# Patient Record
Sex: Male | Born: 1940 | Race: Black or African American | Hispanic: No | Marital: Married | Smoking: Former smoker
Health system: Southern US, Community
[De-identification: ages and names within clinical notes are randomized; demographics above are authoritative.]

## PROBLEM LIST (undated history)

## (undated) DIAGNOSIS — I10 Essential (primary) hypertension: Secondary | ICD-10-CM

## (undated) DIAGNOSIS — I219 Acute myocardial infarction, unspecified: Secondary | ICD-10-CM

## (undated) DIAGNOSIS — I2510 Atherosclerotic heart disease of native coronary artery without angina pectoris: Secondary | ICD-10-CM

## (undated) DIAGNOSIS — D649 Anemia, unspecified: Secondary | ICD-10-CM

## (undated) DIAGNOSIS — I350 Nonrheumatic aortic (valve) stenosis: Secondary | ICD-10-CM

## (undated) DIAGNOSIS — I6529 Occlusion and stenosis of unspecified carotid artery: Secondary | ICD-10-CM

## (undated) DIAGNOSIS — E119 Type 2 diabetes mellitus without complications: Secondary | ICD-10-CM

## (undated) DIAGNOSIS — L97529 Non-pressure chronic ulcer of other part of left foot with unspecified severity: Secondary | ICD-10-CM

## (undated) DIAGNOSIS — R011 Cardiac murmur, unspecified: Secondary | ICD-10-CM

## (undated) DIAGNOSIS — E785 Hyperlipidemia, unspecified: Secondary | ICD-10-CM

## (undated) DIAGNOSIS — M869 Osteomyelitis, unspecified: Secondary | ICD-10-CM

## (undated) DIAGNOSIS — A4101 Sepsis due to Methicillin susceptible Staphylococcus aureus: Secondary | ICD-10-CM

## (undated) DIAGNOSIS — I509 Heart failure, unspecified: Secondary | ICD-10-CM

## (undated) DIAGNOSIS — E11621 Type 2 diabetes mellitus with foot ulcer: Secondary | ICD-10-CM

## (undated) HISTORY — DX: Acute myocardial infarction, unspecified: I21.9

## (undated) HISTORY — DX: Type 2 diabetes mellitus without complications: E11.9

## (undated) HISTORY — DX: Cardiac murmur, unspecified: R01.1

## (undated) HISTORY — PX: CORONARY ANGIOPLASTY WITH STENT PLACEMENT: SHX49

## (undated) HISTORY — DX: Heart failure, unspecified: I50.9

## (undated) HISTORY — DX: Essential (primary) hypertension: I10

## (undated) HISTORY — DX: Non-pressure chronic ulcer of other part of left foot with unspecified severity: L97.529

## (undated) HISTORY — DX: Atherosclerotic heart disease of native coronary artery without angina pectoris: I25.10

## (undated) HISTORY — DX: Anemia, unspecified: D64.9

## (undated) HISTORY — DX: Hyperlipidemia, unspecified: E78.5

## (undated) HISTORY — DX: Type 2 diabetes mellitus with foot ulcer: E11.621

## (undated) HISTORY — DX: Nonrheumatic aortic (valve) stenosis: I35.0

## (undated) HISTORY — DX: Occlusion and stenosis of unspecified carotid artery: I65.29

## (undated) HISTORY — PX: BLADDER REPAIR: SHX76

## (undated) HISTORY — PX: APPENDECTOMY: SHX54

## (undated) HISTORY — DX: Osteomyelitis, unspecified: M86.9

## (undated) HISTORY — DX: Sepsis due to methicillin susceptible Staphylococcus aureus: A41.01

## (undated) HISTORY — PX: CARDIAC CATHETERIZATION: SHX172

---

## 2003-05-10 ENCOUNTER — Observation Stay (HOSPITAL_COMMUNITY): Admission: RE | Admit: 2003-05-10 | Discharge: 2003-05-11 | Payer: Self-pay | Admitting: Cardiology

## 2005-03-13 ENCOUNTER — Ambulatory Visit: Payer: Self-pay

## 2005-08-19 ENCOUNTER — Ambulatory Visit: Payer: Self-pay | Admitting: Internal Medicine

## 2005-09-18 ENCOUNTER — Ambulatory Visit: Payer: Self-pay

## 2005-09-18 ENCOUNTER — Encounter: Payer: Self-pay | Admitting: Cardiology

## 2006-02-27 ENCOUNTER — Ambulatory Visit: Payer: Self-pay | Admitting: Cardiology

## 2006-04-04 ENCOUNTER — Ambulatory Visit: Payer: Self-pay

## 2007-07-22 ENCOUNTER — Ambulatory Visit: Payer: Self-pay

## 2007-07-22 ENCOUNTER — Ambulatory Visit: Payer: Self-pay | Admitting: Cardiology

## 2007-07-28 ENCOUNTER — Ambulatory Visit: Payer: Self-pay

## 2008-04-16 ENCOUNTER — Inpatient Hospital Stay (HOSPITAL_COMMUNITY): Admission: EM | Admit: 2008-04-16 | Discharge: 2008-04-18 | Payer: Self-pay | Admitting: Emergency Medicine

## 2008-04-16 ENCOUNTER — Ambulatory Visit: Payer: Self-pay | Admitting: Cardiology

## 2008-05-11 ENCOUNTER — Ambulatory Visit: Payer: Self-pay | Admitting: Cardiology

## 2008-07-22 ENCOUNTER — Ambulatory Visit: Payer: Self-pay

## 2008-08-02 ENCOUNTER — Ambulatory Visit: Payer: Medicare PPO | Admitting: Ophthalmology

## 2008-08-15 ENCOUNTER — Ambulatory Visit: Payer: Medicare PPO | Admitting: Ophthalmology

## 2008-08-19 ENCOUNTER — Ambulatory Visit: Payer: Self-pay | Admitting: Cardiology

## 2008-09-09 ENCOUNTER — Encounter (INDEPENDENT_AMBULATORY_CARE_PROVIDER_SITE_OTHER): Payer: Self-pay | Admitting: *Deleted

## 2008-12-14 ENCOUNTER — Ambulatory Visit: Payer: Self-pay | Admitting: Cardiology

## 2008-12-15 ENCOUNTER — Encounter: Payer: Self-pay | Admitting: Internal Medicine

## 2009-02-02 DIAGNOSIS — I219 Acute myocardial infarction, unspecified: Secondary | ICD-10-CM | POA: Insufficient documentation

## 2009-02-02 DIAGNOSIS — R0602 Shortness of breath: Secondary | ICD-10-CM | POA: Insufficient documentation

## 2009-02-02 DIAGNOSIS — R079 Chest pain, unspecified: Secondary | ICD-10-CM | POA: Insufficient documentation

## 2009-02-02 DIAGNOSIS — IMO0002 Reserved for concepts with insufficient information to code with codable children: Secondary | ICD-10-CM | POA: Insufficient documentation

## 2009-02-02 DIAGNOSIS — E1165 Type 2 diabetes mellitus with hyperglycemia: Secondary | ICD-10-CM | POA: Insufficient documentation

## 2009-02-02 DIAGNOSIS — I2510 Atherosclerotic heart disease of native coronary artery without angina pectoris: Secondary | ICD-10-CM | POA: Insufficient documentation

## 2009-02-02 DIAGNOSIS — E785 Hyperlipidemia, unspecified: Secondary | ICD-10-CM | POA: Insufficient documentation

## 2009-02-02 DIAGNOSIS — E1151 Type 2 diabetes mellitus with diabetic peripheral angiopathy without gangrene: Secondary | ICD-10-CM | POA: Insufficient documentation

## 2009-02-02 DIAGNOSIS — I679 Cerebrovascular disease, unspecified: Secondary | ICD-10-CM | POA: Insufficient documentation

## 2009-02-02 DIAGNOSIS — I10 Essential (primary) hypertension: Secondary | ICD-10-CM | POA: Insufficient documentation

## 2009-02-02 DIAGNOSIS — I739 Peripheral vascular disease, unspecified: Secondary | ICD-10-CM | POA: Insufficient documentation

## 2009-02-06 ENCOUNTER — Ambulatory Visit: Payer: Self-pay | Admitting: Cardiology

## 2009-02-07 ENCOUNTER — Encounter (INDEPENDENT_AMBULATORY_CARE_PROVIDER_SITE_OTHER): Payer: Self-pay | Admitting: *Deleted

## 2009-03-03 ENCOUNTER — Encounter (INDEPENDENT_AMBULATORY_CARE_PROVIDER_SITE_OTHER): Payer: Self-pay | Admitting: *Deleted

## 2009-04-19 ENCOUNTER — Ambulatory Visit: Payer: Self-pay | Admitting: Cardiology

## 2009-05-01 LAB — HM COLONOSCOPY

## 2009-05-05 ENCOUNTER — Ambulatory Visit: Payer: Medicare PPO | Admitting: Ophthalmology

## 2009-05-15 ENCOUNTER — Ambulatory Visit: Payer: Medicare PPO | Admitting: Ophthalmology

## 2009-06-27 ENCOUNTER — Encounter (INDEPENDENT_AMBULATORY_CARE_PROVIDER_SITE_OTHER): Payer: Self-pay | Admitting: *Deleted

## 2009-08-09 ENCOUNTER — Encounter: Payer: Self-pay | Admitting: Cardiology

## 2009-08-10 ENCOUNTER — Encounter: Payer: Self-pay | Admitting: Cardiology

## 2009-08-10 ENCOUNTER — Ambulatory Visit: Payer: Self-pay

## 2010-04-06 ENCOUNTER — Encounter: Payer: Self-pay | Admitting: Cardiology

## 2010-04-06 ENCOUNTER — Ambulatory Visit
Admission: RE | Admit: 2010-04-06 | Discharge: 2010-04-06 | Payer: Self-pay | Source: Home / Self Care | Attending: Cardiology | Admitting: Cardiology

## 2010-05-03 NOTE — Miscellaneous (Signed)
  Clinical Lists Changes  Observations: Added new observation of RS STUDY: TRACER - study completion 04/19/09 (06/27/2009 12:05)      Research Study Name: TRACER - study completion 04/19/09

## 2010-05-03 NOTE — Assessment & Plan Note (Signed)
Summary: f1y  Medications Added AMLODIPINE BESYLATE 10 MG TABS (AMLODIPINE BESYLATE) Take one tablet by mouth daily NOVOLOG 100 UNIT/ML SOLN (INSULIN ASPART) sliding scale NITROSTAT 0.4 MG SUBL (NITROGLYCERIN) 1 tablet under tongue at onset of chest pain; you may repeat every 5 minutes for up to 3 doses.        CC:  pt stopped his plavix.  History of Present Illness: Jordan [name] is a pleasant gentleman with a history of coronary artery disease.  Last cardiac catheterization was performed on April 18, 2008.  At that time, he was found to have normal left main.  The LAD had a 40-50% lesion after the second diagonal, but the stent was widely patent.  In the second diagonal, there was a 60-70% lesion, which did not appear to be flow limiting.  There was a 30% circumflex.  The right coronary artery was occluded.  The distal right filled via left-to-right collaterals.  The ejection fraction was 50%. Medical therapy was recommended. Last echocardiogram was performed in June of 2007. LV function was normal, there was mild left atrial enlargement and mild mitral regurgitation. Last carotid Dopplers were performed in May 2011. There was 40-59% bilateral stenosis and followup as right recommended in 2 years. I last saw him in Nov of 2010. Since then the patient denies any dyspnea on exertion, orthopnea, PND, pedal edema, palpitations, syncope or chest pain.   Current Medications (verified): 1)  Fish Oil   Oil (Fish Oil) .[name].. 1 Tab By Mouth Once Daily 2)  Amlodipine Besylate 5 Mg Tabs (Amlodipine Besylate) .... Take One Tablet By Mouth Daily 3)  Vitamin B-6 and Vit B12 .[name].. 1 Tab By Mouth Once Daily 4)  Altace 10 Mg Caps (Ramipril) .[name].. 1 Tab By Mouth Once Daily 5)  Imdur 30 Mg Xr24h-Tab (Isosorbide Mononitrate) .[name].. 1 Tab By Mouth Once Daily 6)  Aspirin 81 Mg Tbec (Aspirin) .... Take One Tablet By Mouth Daily 7)  Crestor 20 Mg Tabs (Rosuvastatin Calcium) .... Take One Tablet By Mouth Daily. 8)   Metformin Hcl 1000 Mg Tabs (Metformin Hcl) .[name].. 1 Tab By Mouth Two Times A Day 9)  Metoprolol Succinate 100 Mg Xr24h-Tab (Metoprolol Succinate) .... Take One Tablet By Mouth Daily 10)  Multivitamins   Tabs (Multiple Vitamin) .[name].. 1 Tab By Mouth Once Daily 11)  Onglyza 5 Mg Tabs (Saxagliptin Hcl) .... One Tablet By Mouth Once Daily 12)  Novolog 100 Unit/ml Soln (Insulin Aspart) .... Sliding Scale 13)  Nitrostat 0.4 Mg Subl (Nitroglycerin) .[name].. 1 Tablet Under Tongue At Onset of Chest Pain; You May Repeat Every 5 Minutes For Up To 3 Doses.  Past History:  Past Medical History: CEREBROVASCULAR DISEASE (ICD-437.9) CAD (ICD-414.00) MI (ICD-410.90) PVD (ICD-443.9) HYPERLIPIDEMIA (ICD-272.4) HYPERTENSION (ICD-401.9) DM (ICD-250.00)  Past Surgical History: Reviewed history from 02/02/2009 and no changes required.  Status post appendectomy.   Social History: Reviewed history from 02/02/2009 and no changes required.  The patient does not smoke at present.  He rarely   consumes alcohol.   Review of Systems       Occasional cough but no fevers or chills, hemoptysis, dysphasia, odynophagia, melena, hematochezia, dysuria, hematuria, rash, seizure activity, orthopnea, PND, pedal edema, claudication. Remaining systems are negative.   Vital Signs:  Patient profile:   70 year old male Height:      69 inches Weight:      227 pounds BMI:     33.64 Pulse rate:   62 / minute Resp:  14 per minute BP sitting:   143 / 67  (left arm)  Vitals Entered By: Jordan [name] (April 06, 2010 8:15 AM)  Physical Exam  General:  Well developed/well nourished in NAD Skin warm/dry Patient not depressed No peripheral clubbing Back-normal HEENT-normal/normal eyelids Neck supple/normal carotid upstroke bilaterally; no bruits; no JVD; no thyromegaly chest - CTA/ normal expansion CV - RRR/normal S1 and S2; no rubs or gallops;  PMI nondisplaced; 2/6 systolic murmur left sternal border. Abdomen  -NT/ND, no HSM, no mass, + bowel sounds, no bruit 2+ femoral pulses, no bruits Ext-no edema, chords, 2+ DP Neuro-grossly nonfocal     Impression & Recommendations:  Problem # 1:  CEREBROVASCULAR DISEASE (ICD-437.9) Continue aspirin and statin. Followup carotid Dopplers May 2013.  Problem # 2:  CAD (ICD-414.00) Continue aspirin but discontinued Plavix. Continue beta blocker and statin. Plan Myoview which he return in one year. Plan echocardiogram in one year when he returns for possible mild aortic stenosis. The following medications were removed from the medication list:    Plavix 75 Mg Tabs (Clopidogrel bisulfate) .[name]... Take one tablet by mouth daily His updated medication list for this problem includes:    Amlodipine Besylate 10 Mg Tabs (Amlodipine besylate) .[name]... Take one tablet by mouth daily    Altace 10 Mg Caps (Ramipril) .[name]... 1 tab by mouth once daily    Imdur 30 Mg Xr24h-tab (Isosorbide mononitrate) .[name]... 1 tab by mouth once daily    Aspirin 81 Mg Tbec (Aspirin) .[name]... Take one tablet by mouth daily    Metoprolol Succinate 100 Mg Xr24h-tab (Metoprolol succinate) .[name]... Take one tablet by mouth daily    Nitrostat 0.4 Mg Subl (Nitroglycerin) .[name]... 1 tablet under tongue at onset of chest pain; you may repeat every 5 minutes for up to 3 doses.  The following medications were removed from the medication list:    Plavix 75 Mg Tabs (Clopidogrel bisulfate) .[name]... Take one tablet by mouth daily His updated medication list for this problem includes:    Amlodipine Besylate 10 Mg Tabs (Amlodipine besylate) .[name]... Take one tablet by mouth daily    Altace 10 Mg Caps (Ramipril) .[name]... 1 tab by mouth once daily    Imdur 30 Mg Xr24h-tab (Isosorbide mononitrate) .[name]... 1 tab by mouth once daily    Aspirin 81 Mg Tbec (Aspirin) .[name]... Take one tablet by mouth daily    Metoprolol Succinate 100 Mg Xr24h-tab (Metoprolol succinate) .[name]... Take one tablet by mouth daily    Nitrostat 0.4 Mg Subl  (Nitroglycerin) .[name]... 1 tablet under tongue at onset of chest pain; you may repeat every 5 minutes for up to 3 doses.  Problem # 3:  HYPERLIPIDEMIA (ICD-272.4) Continue statin. Lipids and liver monitored by primary care. His updated medication list for this problem includes:    Crestor 20 Mg Tabs (Rosuvastatin calcium) .[name]... Take one tablet by mouth daily.  Problem # 4:  HYPERTENSION (ICD-401.9) Blood pressure elevated. Increase amlodipine to 10 mg p.o. daily. His updated medication list for this problem includes:    Amlodipine Besylate 10 Mg Tabs (Amlodipine besylate) .[name]... Take one tablet by mouth daily    Altace 10 Mg Caps (Ramipril) .[name]... 1 tab by mouth once daily    Aspirin 81 Mg Tbec (Aspirin) .[name]... Take one tablet by mouth daily    Metoprolol Succinate 100 Mg Xr24h-tab (Metoprolol succinate) .[name]... Take one tablet by mouth daily  Problem # 5:  DM (ICD-250.00)  His updated medication list for this problem includes:  Altace 10 Mg Caps (Ramipril) .[name]... 1 tab by mouth once daily    Aspirin 81 Mg Tbec (Aspirin) .[name]... Take one tablet by mouth daily    Metformin Hcl 1000 Mg Tabs (Metformin hcl) .[name]... 1 tab by mouth two times a day    Onglyza 5 Mg Tabs (Saxagliptin hcl) ..... One tablet by mouth once daily    Novolog 100 Unit/ml Soln (Insulin aspart) ..... Sliding scale  Patient Instructions: 1)  Your physician has recommended you make the following change in your medication: INCREASE AMLODIPINE 10MG  ONCE DAILY 2)  Your physician wants you to follow-up in:ONE YEAR   You will receive a reminder letter in the mail two months in advance. If you don't receive a letter, please call our office to schedule the follow-up appointment. Prescriptions: AMLODIPINE BESYLATE 10 MG TABS (AMLODIPINE BESYLATE) Take one tablet by mouth daily  #90 x 3   Entered by:   Jordan Goody, RN   Authorized by:   Ferman Hamming, MD, [facility]   Signed by:   Jordan Goody, RN on 04/06/2010   Method used:   Faxed  to ...       [facility] (mail-order)             , [place]         Ph: [phone]       Fax: [phone]   RxID:   [id number]

## 2010-05-03 NOTE — Miscellaneous (Signed)
Summary: Orders Update  Clinical Lists Changes  Orders: Added new Test order of Carotid Duplex (Carotid Duplex) - Signed 

## 2010-07-16 LAB — CK TOTAL AND CKMB (NOT AT ARMC)
CK, MB: 4.3 ng/mL — ABNORMAL HIGH (ref 0.3–4.0)
CK, MB: 4.4 ng/mL — ABNORMAL HIGH (ref 0.3–4.0)
CK, MB: 5.6 ng/mL — ABNORMAL HIGH (ref 0.3–4.0)
Relative Index: 2.9 — ABNORMAL HIGH (ref 0.0–2.5)
Relative Index: 2.9 — ABNORMAL HIGH (ref 0.0–2.5)
Relative Index: 3.6 — ABNORMAL HIGH (ref 0.0–2.5)
Total CK: 148 U/L (ref 7–232)
Total CK: 150 U/L (ref 7–232)
Total CK: 156 U/L (ref 7–232)

## 2010-07-16 LAB — DIFFERENTIAL
Basophils Absolute: 0 10*3/uL (ref 0.0–0.1)
Basophils Relative: 0 % (ref 0–1)
Eosinophils Absolute: 0.2 10*3/uL (ref 0.0–0.7)
Eosinophils Relative: 3 % (ref 0–5)
Lymphocytes Relative: 28 % (ref 12–46)
Lymphs Abs: 2.2 10*3/uL (ref 0.7–4.0)
Monocytes Absolute: 0.5 10*3/uL (ref 0.1–1.0)
Monocytes Relative: 6 % (ref 3–12)
Neutro Abs: 4.8 10*3/uL (ref 1.7–7.7)
Neutrophils Relative %: 62 % (ref 43–77)

## 2010-07-16 LAB — PROTIME-INR
INR: 1 (ref 0.00–1.49)
INR: 1.1 (ref 0.00–1.49)
Prothrombin Time: 13.7 seconds (ref 11.6–15.2)
Prothrombin Time: 14.2 seconds (ref 11.6–15.2)

## 2010-07-16 LAB — GLUCOSE, CAPILLARY
Glucose-Capillary: 137 mg/dL — ABNORMAL HIGH (ref 70–99)
Glucose-Capillary: 144 mg/dL — ABNORMAL HIGH (ref 70–99)
Glucose-Capillary: 151 mg/dL — ABNORMAL HIGH (ref 70–99)
Glucose-Capillary: 157 mg/dL — ABNORMAL HIGH (ref 70–99)
Glucose-Capillary: 164 mg/dL — ABNORMAL HIGH (ref 70–99)
Glucose-Capillary: 206 mg/dL — ABNORMAL HIGH (ref 70–99)
Glucose-Capillary: 64 mg/dL — ABNORMAL LOW (ref 70–99)
Glucose-Capillary: 85 mg/dL (ref 70–99)
Glucose-Capillary: 91 mg/dL (ref 70–99)

## 2010-07-16 LAB — CBC
HCT: 34 % — ABNORMAL LOW (ref 39.0–52.0)
HCT: 35 % — ABNORMAL LOW (ref 39.0–52.0)
HCT: 38.6 % — ABNORMAL LOW (ref 39.0–52.0)
Hemoglobin: 11 g/dL — ABNORMAL LOW (ref 13.0–17.0)
Hemoglobin: 11.6 g/dL — ABNORMAL LOW (ref 13.0–17.0)
Hemoglobin: 12.5 g/dL — ABNORMAL LOW (ref 13.0–17.0)
MCHC: 32.3 g/dL (ref 30.0–36.0)
MCHC: 32.4 g/dL (ref 30.0–36.0)
MCHC: 33.3 g/dL (ref 30.0–36.0)
MCV: 84.3 fL (ref 78.0–100.0)
MCV: 84.7 fL (ref 78.0–100.0)
MCV: 85.7 fL (ref 78.0–100.0)
Platelets: 237 10*3/uL (ref 150–400)
Platelets: 268 10*3/uL (ref 150–400)
Platelets: 279 10*3/uL (ref 150–400)
RBC: 4.02 MIL/uL — ABNORMAL LOW (ref 4.22–5.81)
RBC: 4.15 MIL/uL — ABNORMAL LOW (ref 4.22–5.81)
RBC: 4.51 MIL/uL (ref 4.22–5.81)
RDW: 15 % (ref 11.5–15.5)
RDW: 15.4 % (ref 11.5–15.5)
RDW: 15.7 % — ABNORMAL HIGH (ref 11.5–15.5)
WBC: 6.3 10*3/uL (ref 4.0–10.5)
WBC: 7.8 10*3/uL (ref 4.0–10.5)
WBC: 8.3 10*3/uL (ref 4.0–10.5)

## 2010-07-16 LAB — LIPID PANEL
Cholesterol: 171 mg/dL (ref 0–200)
HDL: 24 mg/dL — ABNORMAL LOW (ref >39)
LDL Cholesterol: 111 mg/dL — ABNORMAL HIGH (ref 0–99)
Total CHOL/HDL Ratio: 7.1 RATIO
Triglycerides: 181 mg/dL — ABNORMAL HIGH (ref <150)
VLDL: 36 mg/dL (ref 0–40)

## 2010-07-16 LAB — COMPREHENSIVE METABOLIC PANEL
ALT: 22 U/L (ref 0–53)
ALT: 29 U/L (ref 0–53)
AST: 28 U/L (ref 0–37)
AST: 30 U/L (ref 0–37)
Albumin: 3.4 g/dL — ABNORMAL LOW (ref 3.5–5.2)
Albumin: 4 g/dL (ref 3.5–5.2)
Alkaline Phosphatase: 101 U/L (ref 39–117)
Alkaline Phosphatase: 81 U/L (ref 39–117)
BUN: 10 mg/dL (ref 6–23)
BUN: 15 mg/dL (ref 6–23)
CO2: 26 mEq/L (ref 19–32)
CO2: 29 mEq/L (ref 19–32)
Calcium: 8.9 mg/dL (ref 8.4–10.5)
Calcium: 9.4 mg/dL (ref 8.4–10.5)
Chloride: 102 mEq/L (ref 96–112)
Chloride: 103 mEq/L (ref 96–112)
Creatinine, Ser: 1.06 mg/dL (ref 0.4–1.5)
Creatinine, Ser: 1.12 mg/dL (ref 0.4–1.5)
GFR calc Af Amer: >60 mL/min (ref >60)
GFR calc Af Amer: >60 mL/min (ref >60)
GFR calc non Af Amer: >60 mL/min (ref >60)
GFR calc non Af Amer: >60 mL/min (ref >60)
Glucose, Bld: 159 mg/dL — ABNORMAL HIGH (ref 70–99)
Glucose, Bld: 202 mg/dL — ABNORMAL HIGH (ref 70–99)
Potassium: 3.9 mEq/L (ref 3.5–5.1)
Potassium: 4 mEq/L (ref 3.5–5.1)
Sodium: 134 mEq/L — ABNORMAL LOW (ref 135–145)
Sodium: 135 mEq/L (ref 135–145)
Total Bilirubin: 0.5 mg/dL (ref 0.3–1.2)
Total Bilirubin: 0.6 mg/dL (ref 0.3–1.2)
Total Protein: 6.3 g/dL (ref 6.0–8.3)
Total Protein: 7.6 g/dL (ref 6.0–8.3)

## 2010-07-16 LAB — HEMOGLOBIN A1C
Hgb A1c MFr Bld: 7.9 % — ABNORMAL HIGH (ref 4.6–6.1)
Mean Plasma Glucose: 180 mg/dL

## 2010-07-16 LAB — TROPONIN I
Troponin I: 0.01 ng/mL (ref 0.00–0.06)
Troponin I: 0.01 ng/mL (ref 0.00–0.06)
Troponin I: 0.01 ng/mL (ref 0.00–0.06)

## 2010-07-16 LAB — POCT CARDIAC MARKERS
CKMB, poc: 5 ng/mL (ref 1.0–8.0)
Myoglobin, poc: 200 ng/mL (ref 12–200)
Troponin i, poc: <0.05 ng/mL (ref 0.00–0.09)

## 2010-07-16 LAB — HEPARIN LEVEL (UNFRACTIONATED)
Heparin Unfractionated: 0.59 IU/mL (ref 0.30–0.70)
Heparin Unfractionated: 0.7 IU/mL (ref 0.30–0.70)

## 2010-07-16 LAB — TSH: TSH: 2.541 u[IU]/mL (ref 0.350–4.500)

## 2010-07-25 ENCOUNTER — Other Ambulatory Visit: Payer: Self-pay | Admitting: Cardiology

## 2010-08-14 NOTE — Assessment & Plan Note (Signed)
[facility]                            CARDIOLOGY OFFICE NOTE   GADIEL, JOHN                        MRN:          [id number]  DATE:05/11/2008                            DOB:          1941-03-08    Mr. Fells is a pleasant gentleman with a history of coronary artery  disease.  He was recently admitted to [facility] with chest  pain.  He ruled out for myocardial infarction with serial enzymes.  The  cardiac catheterization was performed on April 18, 2008.  At that  time, he was found to have normal left main.  The LAD had a 40-50%  lesion after the second diagonal, but the stent was widely patent.  In  the second diagonal, there was a 60-70% lesion, which did not appear to  be flow limiting.  There was a 30% circumflex.  The right coronary  artery was occluded.  The distal right filled via left-to-right  collaterals.  The ejection fraction was 50%.  It was felt that medical  therapy was indicated.  Since then, he has done well with no dyspnea,  chest pain, palpitations, or syncope.  There is no pedal edema.   MEDICATIONS:  1. Fish oil 1 g p.o. daily.  2. Norvasc 5 mg p.o. daily.  3. TRACER study drug.  4. Vitamin B6 and B12.  5. Altace 10 mg p.o. daily.  6. Imdur 30 mg p.o. daily.  7. He also takes glyburide 10 mg p.o. b.i.d.  8. Aspirin 325 mg daily.  9. Plavix 75 mg p.o. daily.  10.Crestor 20 mg p.o. daily.  11.Metformin HCT 1000 mg p.o. b.i.d.  12.Toprol 100 mg p.o. daily.   PHYSICAL EXAMINATION:  VITAL SIGNS:  Blood pressure 167/70 and his pulse  is 66.  He weighs 225 pounds.  HEENT:  Normal.  NECK:  Supple.  CHEST:  Clear.  CARDIOVASCULAR:  Regular rate.  ABDOMEN:  No tenderness.  Right groin shows no hematoma and no bruit.  EXTREMITIES:  No edema.   His electrocardiogram shows a sinus rhythm at a rate of 66.  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  There are nonspecific T-wave changes.   DIAGNOSES:  1. Coronary artery  disease, status post stents - the patient has had      no further chest pains and his recent catheterization showed      moderate coronary artery disease.  We will continue with medical      therapy including his aspirin, but I would decrease this to 81 mg      p.o. daily.  We will also continue with Plavix, beta-blocker,      angiotensin-converting enzyme inhibitor, and statin.  2. Cerebrovascular disease - he will need follow up carotid Dopplers      in April.  3. Diabetes mellitus.  4. Hypertension - his blood pressure is mildly elevated today.      However, he has tracked this at home and is normally with systolic      less than 130.  We will track this and increase his medications as  indicated.  5. Hyperlipidemia - We will increase his Crestor to 40 mg p.o. daily      and check lipids and liver in 6 weeks and adjust as indicated.  6. History of peripheral vascular disease.   I will see him back in 6 months.     Madolyn Frieze Jens Som, MD, [facility]  Electronically Signed    BSC/MedQ  DD: 05/11/2008  DT: 05/11/2008  Job #: [id number]   cc:   Wannetta Sender.

## 2010-08-14 NOTE — H&P (Signed)
NAME:  Jordan [name], Jordan [name] NO.:  [id number]   MEDICAL RECORD NO.:  [id number]          PATIENT TYPE:  INP   LOCATION:  2011                         FACILITY:  [facility]   PHYSICIAN:  Madolyn Frieze. Jens Som, MD, FACCDATE OF BIRTH:  Apr 04, 1940   DATE OF ADMISSION:  04/16/2008  DATE OF DISCHARGE:                              HISTORY & PHYSICAL   Jordan [name] is a 70 year old male with past medical history of diabetes  mellitus, hypertension, hyperlipidemia, and coronary artery disease, who  we are admitting for chest pain.  The patient's last cardiac  catheterization was performed in February 2005.  Note, he does have  history of a previous inferior myocardial infarction in 1995, treated  with the right coronary artery.  However, followup catheterization 6  months later showed an occluded right coronary artery.  His  catheterization in 2005 revealed a 40% proximal LAD and an 80% narrowing  following this.  There is also 70% narrowing in the second diagonal.  The circumflex artery had a 70% ostial lesion and 90% lesion in the  proximal portion.  The right coronary was completely occluded.  There  was also a 95% stenosis after the first RV branch.  The distal right  filled via collaterals in the left coronary artery.  The ejection  fraction was 55%.  The patient subsequently had stenting of the proximal  LAD and of the circumflex.  He has done well since then.  The patient  does state that for the past 2 mornings he has had some problems with  nausea for 2 hours.  He attributes this to potentially taking a new type  of vitamin.  He also denies any fevers or chills, vomiting or diarrhea.  There has been no abdominal pain.  This morning, he then developed  substernal chest pain.  Pain did not radiate.  It is not pleuritic or  positional nor is it related to food.  There was associated diaphoresis,  but there was no shortness of breath, nausea, or vomiting.  The pain  would last for  10-15 minutes and resolves spontaneously.  He thinks it  is similar to the pain prior to his infarct.  Note, it did not radiate  to his back.  Because of the above, he presented to the emergency room.   His medications include:  1. Aspirin.  2. Altace 10 mg p.o. b.i.d.  3. Glyburide 10 mg p.o. b.i.d.  4. Toprol 200 mg p.o. daily.  5. Metformin 1000 mg p.o. b.i.d.  6. Crestor 20 mg p.o. daily.  7. Plavix 75 mg p.o. daily.  8. He also takes insulin via pump.   He has no known drug allergies.   SOCIAL HISTORY:  The patient does not smoke at present.  He rarely  consumes alcohol.   FAMILY HISTORY:  Positive for coronary artery disease.   His past medical history is significant for diabetes mellitus,  hypertension, and hyperlipidemia.  He has a history of coronary artery  disease as outlined in the HPI.  He also has a history of  cerebrovascular disease.  He has had  a previous appendectomy.  There is  also a history of peripheral vascular disease.   REVIEW OF SYSTEMS:  He denies any headaches, fevers, or chills.  There  is no productive cough or hemoptysis.  There is no dysphagia,  odynophagia, melena, or hematochezia.  There is no dysuria or hematuria.  There is no rash or seizure activity.  There is no orthopnea, PND, or  pedal edema.  The remaining systems are negative.   PHYSICAL EXAMINATION:  VITAL SIGNS:  Today shows an elevated blood  pressure at 185/74 and his pulse is 61.  His temperature is 97.9.  GENERAL:  He is well developed and somewhat obese.  He is in no acute  distress at present.  SKIN:  Warm and dry.  He does not appear to be depressed.  There is no  peripheral clubbing.  BACK:  Normal.  HEENT:  Normal with normal eyelids.  NECK:  Supple with normal upstroke bilaterally.  No audible bruits  noted.  There is no jugular venous distention.  I cannot appreciate  thyromegaly.  CHEST:  Clear to auscultation.  Normal expansion.  CARDIOVASCULAR:  Regular rate and  rhythm.  Normal S1 and S2.  There is a  1/6 systolic murmur at the left sternal border.  ABDOMEN:  Nontender and nondistended.  Positive bowel sounds.  No  hepatosplenomegaly.  No mass appreciated.  There is no abdominal bruit.  He has 2+ femoral pulses bilaterally.  No bruits.  EXTREMITIES:  No edema.  I can palpate no cords.  His distal pulses are  diminished.  NEUROLOGIC:  Grossly intact.   His electrocardiogram shows a sinus rhythm at a rate of 57.  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  There is lateral T-wave inversion.  I do not  have an old electrocardiogram for comparison, but based on my  description from an office note on July 22, 2007, it is unchanged.  His  sodium is 135 and his BUN and creatinine are 15 and 1.12 respectively.  Liver functions are normal.  His initial cardiac markers are normal.  His hemoglobin and hematocrit are 12.5 and 38.6 respectively.  His MCV  is normal at 85.7.  The patient also has had a chest x-ray today that  was negative.   DIAGNOSES:  1. Chest pain - the patient's symptoms are somewhat atypical, but he      states that they are similar to his symptoms prior to his previous      interventions.  We will admit and rule out myocardial infarction      with serial enzymes.  I think the best course of action will be to      proceed with cardiac catheterization.  The risk and benefits have      been discussed and he agrees to proceed.  In the meantime, we will      treat with aspirin, heparin, and nitroglycerin and we will continue      with his ACE inhibitor, beta-blocker, and statin.  2. Hypertension - his blood pressure is elevated today.  We are adding      IV nitroglycerin and we will track this and adjust as indicated.  3. Diabetes mellitus - we will check a hemoglobin A1c as well as CBGs.      He will continue on his insulin as well as his glyburide.  I will      hold his metformin in an anticipation of cardiac catheterization.  4.  Hyperlipidemia -  he will continue on his statin.      Madolyn Frieze Jens Som, MD, [facility]  Electronically Signed     BSC/MEDQ  D:  04/16/2008  T:  04/17/2008  Job:  [id number]

## 2010-08-14 NOTE — Discharge Summary (Signed)
NAME:  Jordan [name], Jordan [name] NO.:  [id number]   MEDICAL RECORD NO.:  [id number]          PATIENT TYPE:  INP   LOCATION:  2011                         FACILITY:  [facility]   PHYSICIAN:  Bevelyn Buckles. Bensimhon, MDDATE OF BIRTH:  [birth year]   DATE OF ADMISSION:  04/16/2008  DATE OF DISCHARGE:  04/18/2008                               DISCHARGE SUMMARY   PRIMARY CARDIOLOGIST:  Madolyn Frieze. Jens Som, MD, [facility]   DISCHARGE DIAGNOSIS:  Chest pain.   SECONDARY DIAGNOSES:  1. Coronary artery disease, status post prior stenting of the mid LAD      and mid left circumflex.  2. Hypertension.  3. Hyperlipidemia.  4. Diabetes mellitus, on insulin pump.  5. History of cerebrovascular disease.  6. Peripheral vascular disease.  7. Status post appendectomy.   ALLERGIES:  No known drug allergies.   PROCEDURE:  Left heart cardiac catheterization revealing patent left  anterior descending and circumflex stents without targets for  intervention.  Ejection fraction 50% with inferior hypokinesis.   HISTORY OF PRESENT ILLNESS:  A 70 year old male with the above problem  list who was in his usual state of health until approximately 2 days  prior to admission when he began to note nausea in the mornings which he  attributed to taking a new type of vitamins.  On the morning of  admission, he developed substernal chest pain without associated  symptoms that he felt was similar to his previous pain.  Presented to  the [facility] ED where ECG showed sinus rhythm with prior inferior  infarct and lateral T-wave inversion.  This was felt to be unchanged.  Cardiac markers were normal.  Chest x-ray was negative.  He was admitted  for further evaluation.   HOSPITAL COURSE:  The patient ruled out for MI and had no recurrent  chest discomfort.  He was initially placed on IV nitroglycerin; however,  this was weaned off.  He underwent left heart cardiac catheterization,  April 18, 2008 revealing patent stents  in the LAD and left circumflex  with an occluded mid RCA.  The distal RCA filled via left-to-right  collaterals.  EF was 50% with inferior hypokinesis.  Overall, his  anatomy was felt to be unchanged and decision was made to continue  medical therapy.  We have initiated Imdur 30 mg daily.  [name] would  be discharged home today in good condition.   DISCHARGE LABS:  Hemoglobin 11.6, hematocrit 35.0, WBC 8.3, platelets  268, MCV 84.3.  Sodium 134, potassium 3.9, chloride 102, CO2 26, BUN 10,  creatinine 1.06, glucose 159, total bilirubin 0.5, alkaline phosphatase  81, AST 30, ALT 22, albumin 3.4.  Cardiac markers negative x3.  Total  cholesterol 171, triglycerides 181, HDL 24, LDL 111, calcium 8.9,  hemoglobin A1C 7.9, TSH 2.541.   DISPOSITION:  The patient will be discharged home today in good  condition.   FOLLOWUP PLANS AND APPOINTMENTS:  We have arranged followup with Dr.  Olga Millers on May 10, 2008 at 10:30 a.m.  He was asked to follow  up with primary care Jenesis Suchy as scheduled.  DISCHARGE MEDICATIONS:  1. Aspirin 81 mg daily.  2. Altace 10 mg b.i.d.  3. Toprol-XL 200 mg daily.  4. Crestor 20 mg daily.  5. Plavix 75 mg daily.  6. Glyburide 10 mg b.i.d.  7. Metformin 1000 mg b.i.d. to be resumed on February 18, 2009.  8. NovoLog pump as previously prescribed.  9. Imdur 30 mg daily.  10.Nitroglycerin 0.4 mg sublingual p.r.n. chest pain.   OUTSTANDING LABORATORY STUDIES:  None.   DURATION OF DISCHARGE/ENCOUNTER:  Including physician time, 35 minutes.      Nicolasa Ducking, ANP      Bevelyn Buckles. Bensimhon, MD  Electronically Signed    CB/MEDQ  D:  04/18/2008  T:  04/18/2008  Job:  [id number]

## 2010-08-14 NOTE — Cardiovascular Report (Signed)
NAME:  CARLYLE, MCELRATH NO.:  [id number]   MEDICAL RECORD NO.:  [id number]          PATIENT TYPE:  INP   LOCATION:  2011                         FACILITY:  [facility]   PHYSICIAN:  Bevelyn Buckles. Bensimhon, MDDATE OF BIRTH:  Dec 01, 1940   DATE OF PROCEDURE:  04/18/2008  DATE OF DISCHARGE:  04/18/2008                            CARDIAC CATHETERIZATION   PATIENT IDENTIFICATION:  Mr. Shepard is a very pleasant 70 year old male  with a history of diabetes, hypertension, hyperlipidemia, and coronary  artery disease.  He had an inferior wall myocardial infarction in 1995,  treated with what appears to be balloon angioplasty of the right  coronary artery.  However, followup catheterization 6 months later  showed a totally occluded right coronary artery.  He also had a high-  grade lesion in the LAD and left circumflex, which were both treated  with Taxus drug-eluting stents.  The right coronary artery was totally  occluded with good left-to-right collaterals and he was treated  medically.   He has been doing very well since that time.  Over the past few days, he  has been having some chest pain.  This had both atypical and typical  features.  His cardiac markers were negative.  He is referred for  diagnostic catheterization.   PROCEDURES PERFORMED:  1. Selective coronary angiography.  2. Left heart catheterization.  3. Left ventriculogram.  4. StarClose femoral artery closure.   DESCRIPTION OF THE PROCEDURE:  The risks and indications were explained.  Consent was signed and placed on the chart.  A 5-French arterial sheath  was placed in the right femoral artery using a modified Seldinger  technique.  Standard catheters including JL-4, JR-4, and angled pigtail  were used for procedure.  All catheter exchanges were made over wire.  There were no apparent complications.  Central aortic pressure was  109/59 with a mean of 78.  LV pressure was 117/4 with an EDP of 11.  There was no  aortic stenosis.   Left main was normal.   LAD was a long vessel, wrapping the apex.  It gave off 2 long but fairly  narrow diagonals.  In the mid LAD after the second diagonal, there is a  40-50% lesion.  There was a stent in the mid LAD, which was widely  patent.  There was a moderate diffuse luminal irregularities throughout  the distal LAD.  In the second diagonal branch, there were tandem 60-70%  lesions, which did not appear to be flow limiting.   Left circumflex system gave off a moderate-sized ramus, tiny OM-1, a  large OM-2, and a small OM-3.  There was a 30% lesion in the proximal  left circumflex.  In the mid AV groove circumflex, there was a stent  which was widely patent.   Right coronary artery had a 40% lesion proximally.  It was then totally  occluded after the takeoff of an RV branch.  In the RV branch, there was  a 90% focal lesion.  Also, an RV marginal branch which came off just  after the total occlusion.  This was heavily diseased  with diffuse 90%  lesion in the proximal portion.   The distal RCA fills from copious left-to-right collaterals.  This was  totally unchanged from previous.   Left ventriculogram showed an EF of 50% with inferior hypokinesis.   ASSESSMENT:  1. Stable coronary artery disease with borderline lesions in a      moderate-sized P2.  These do not appear flow limiting.  His LAD and      left circumflex stents are patent.  2. Left ventricular ejection fraction is 50% with inferior      hypokinesis.   PLAN/DISCUSSION:  I think at this point the best option is to proceed  with medical therapy.  Any intervention on the diagonals would be high  risk for re-stenosis given his diabetes and small caliber of those  vessels.      Bevelyn Buckles. Bensimhon, MD  Electronically Signed     DRB/MEDQ  D:  04/18/2008  T:  04/18/2008  Job:  [id number]

## 2010-08-14 NOTE — Assessment & Plan Note (Signed)
[facility]                            CARDIOLOGY OFFICE NOTE   [name], [name]                        MRN:          [id number]  DATE:07/22/2007                            DOB:          [birth year]    [name] is a very pleasant gentleman that I follow for a history of  coronary disease.  He has had prior PCI.  His most recent  catheterization was performed on May 10, 2003.  At that time, he  was found to have a normal left main.  There was an 80% proximal LAD and  a 70% second diagonal.  There is a 70% ostial circumflex and a 90%  lesion in the proximal segment.  The RCA was occluded.  The distal right  coronary fills via collaterals from the left system.  His ejection  fraction was 50-55%.  The patient had stenting of his LAD at that time  with a Taxus stent.  He also had PCI of the circumflex with a Taxus  stent.  Since I last saw him, he is doing well.  He can have dyspnea  with more extreme activities, but not with routine activity.  There is  no orthopnea, PND, pedal edema, presyncope, syncope or exertional chest  pain.  He did state that approximately 5-6 days ago, he had some  palpitations at night.  These were short-lived and not associated with  shortness breath or chest pain.  He has had none other than that one  evening.   MEDICATIONS:  1. Fish oil 1000 mg p.o. daily.  2. Altace 10 mg p.o. b.i.d.  3. Glyburide 10 mg p.o. b.i.d.  4. Aspirin 325 p.o. daily.  5. Plavix 75 mg p.o. daily.  6. Insulin.  7. Crestor 20 mg p.o. daily.  8. Zetia 10 mg p.o. daily.  9. Metformin/HCT 1000 mg p.o. b.i.d.  10.Toprol 100 mg p.o. daily.   PHYSICAL EXAMINATION:  VITAL SIGNS:  Today, shows a blood pressure of  171/80 and his pulse is 51.  HEENT:  Normal.  NECK:  Supple.  CHEST:  Clear.  CARDIOVASCULAR:  Reveals a bradycardic rate, but a regular rhythm.  ABDOMEN:  Shows no tenderness.  EXTREMITIES:  Show no edema.   His  electrocardiogram shows sinus bradycardia at a rate of 57.  There is  a prior inferior infarct.  There is lateral T-wave inversion.   DIAGNOSES:  1. Coronary artery disease - he will continue on his aspirin, Plavix,      beta-blocker and ACE inhibitor, as well as statin.  2. Cerebrovascular disease - he is scheduled for follow-up carotid      Dopplers today.  3. Diabetes mellitus - per his primary care physician.  4. Hypertension - his blood pressure is elevated today.  We will add      hydrochlorothiazide 12.5 mg p.o. daily, and we will check a BMET in      1-week.  5. Hyperlipidemia - he will continue on statin and Dr. Mikey Bussing is      following this.  6. Dyspnea - we will check an  adenosine Myoview.  If it shows no      ischemia, then we will continue with medical therapy.  7. History of peripheral vascular disease.   We will see him back in 12 months.     Madolyn Frieze Jens Som, MD, [facility]  Electronically Signed    BSC/MedQ  DD: 07/22/2007  DT: 07/22/2007  Job #: [id number]   cc:   Jordan [name].

## 2010-08-17 NOTE — Discharge Summary (Signed)
NAME:  TORREZ, RENFROE                        ACCOUNT NO.:  [id number]   MEDICAL RECORD NO.:  [id number]                   PATIENT TYPE:  OIB   LOCATION:  6533                                 FACILITY:  [facility]   PHYSICIAN:  Maple Mirza, P.A.              DATE OF BIRTH:  February 05, 1941   DATE OF ADMISSION:  05/10/2003  DATE OF DISCHARGE:                                 DISCHARGE SUMMARY   ADDENDUM TO DISCHARGE SUMMARY:  The addition is a new medication prescribed  by Dr. Samule Ohm for claudication in Mr. Sayan Aldava on his discharge  May 11, 2003.  It is Pletal 100 mg one p.o. twice daily.  This joins his  other medication.                                                Maple Mirza, P.A.    GM/MEDQ  D:  05/11/2003  T:  05/11/2003  Job:  [id number]   cc:   Salvadore Farber, M.D.  [address]  [address]  [place]  [place] [address]   Olga Millers, M.D.   Mikey Bussing, M.D.  [employer]

## 2010-08-17 NOTE — Cardiovascular Report (Signed)
NAME:  DRYDEN, TAPLEY                        ACCOUNT NO.:  [id number]   MEDICAL RECORD NO.:  [id number]                   PATIENT TYPE:  OIB   LOCATION:  6533                                 FACILITY:  [facility]   PHYSICIAN:  Charlies Constable, M.D.                  DATE OF BIRTH:  1940/07/14   DATE OF PROCEDURE:  05/10/2003  DATE OF DISCHARGE:  05/11/2003                              CARDIAC CATHETERIZATION   CLINICAL HISTORY:  Mr. Oshita is 70 years old and had a previous DMI in 1995  treated with stenting of the right coronary artery.  He was cathed six to 12  months later and found to have an occluded right coronary artery.  He has  done well until the past six months and over this time period he developed  symptoms of exertional dyspnea.  Dr. Mikey Bussing referred him back to [place] and he  was seen by Dr. Jens Som and scheduled for evaluation for catheterization.   PROCEDURE:  The procedure was performed via the right femoral artery using  arterial sheath and 6 French preformed coronary catheters. A front wall  arterial puncture was performed and Omnipaque contrast was used.  Distal  aortogram was performed to evaluate for possible peripheral vascular  disease.  After completion of diagnostic study, made decision to proceed  with intervention on the circumflex and left anterior descending artery.  The patient was enrolled in the Sepia trial and was randomized to either a  10a inhibitor or unfractionated heparin.  We used integrilin double bolus  and infusion as well.  The patient was given 300 mg of Plavix.   We used a CLS 3.5 6 [place] guiding catheter with side holes and a PT2 light  support wire.  We crossed the lesion in the LAD without too much difficulty.  We predilated with a 2.75 x 15-mm Quantum Maverick performing one inflation  up to 14 atmospheres for 30 seconds.  This resulted in a split in the  vessel.  We originally planned to use a 2.5/20 Taxus, but used a 2.5/24 to  be sure that  we could cover the dissection.  We deployed this with one  inflation of 12 atmospheres for 30 seconds.  We then post dilated with a  2.75 x 15-mm Quantum Maverick performing two inflations up to 16 atmospheres  for 30 seconds.   We then approached the circumflex artery.  We passed the PT2 wire down the  circumflex artery without too much difficulty.  We predilated the lesion in  the proximal circumflex artery with a 2.75 x 15-mm Quantum Maverick  performing one inflation of 12 atmospheres for 30 seconds.  We then deployed  a 3.5 x 24-mm Taxus stent deploying this with one inflation with one  inflation of 14 atmospheres for 30 seconds.  We then post dilated with a  3.75 x 15-mm Quantum Maverick performing two inflations of 18 and 16  atmospheres for 30 seconds.  Repeat diagnostic study was then performed  through the guiding catheter.  The patient tolerated procedure well and left  the laboratory in satisfactory condition.   RESULTS:  Aortic pressure was 191/96 with mean of 34.  Left ventricular  pressure was 191/20.   Left main coronary artery:  The left main coronary was free of significant  disease.   Left anterior descending artery:  The left anterior descending artery gave  rise to two diagonal branches and a large and two small septal perforators.  There was 40% narrowing in the proximal LAD and there was another 80%  narrowing in the proximal LAD right at the large septal perforator.  There  was 70% narrowing in the second diagonal branch.   Circumflex artery:  The circumflex artery had a 70% ostial lesion and a 90%  lesion in its proximal portion.  Circumflex artery gave rise to the ramus  branch and two small marginal branches and two posterior lateral branches.   Right coronary artery:  The right coronary artery was completely occluded  after two right ventricular branches.  There was also a 95% stenosis after  the first right ventricular branch before the total occlusion.   The distal  right coronary artery filled via collaterals from the left coronary artery  consistent with posterior descending and posterior lateral branches.   LEFT VENTRICULOGRAM:  The left ventriculogram performed in the RAO  projection showed hypo to akinesis of the inferobasal wall.  The overall  wall motion was good with an estimated ejection fraction of 50-55%.   DISTAL AORTOGRAM:  Distal aortogram was performed which showed a patent  renal artery on the left.  Injection was too low to visualize the right  renal artery.  There was no major aortoiliac obstruction.   Following stenting of the lesion in the proximal LAD, the stenosis improved  from 80% to 10%.   Following stenting of the circumflex lesion, the stenosis improved from 90%  to less than 10%.   CONCLUSIONS:  1. Coronary artery disease, status post remote DMI treated with stenting of     the right coronary artery with total occlusion of the right coronary     artery, 30% ostial, 90% proximal stenosis of the circumflex artery, 40%     proximal and 80% proximal stenosis in the left anterior descending artery     with 70% stenosis in the diagonal branch and inferior wall hypo to     akinesis.  2. Successful stenting of the lesion in the proximal left anterior     descending with a Taxus stent with improvement in stent renarrowing from     80% to 10%.  3. Successful stenting of the lesion in the proximal circumflex artery with     Taxus stent with improvement in stent renarrowing from 90% to less than     20%.   DISPOSITION:  The patient was returned to the postangioplasty unit for  further observation.                                               Charlies Constable, M.D.    BB/MEDQ  D:  05/10/2003  T:  05/11/2003  Job:  [id number]   cc:   Wannetta Sender.  [address]  [place]  [place] [address]  Fax: [phone]

## 2010-08-17 NOTE — Discharge Summary (Signed)
NAME:  Jordan [name], Jordan [name]                        ACCOUNT NO.:  [id number]   MEDICAL RECORD NO.:  [id number]                   PATIENT TYPE:  OIB   LOCATION:  6533                                 FACILITY:  [facility]   PHYSICIAN:  Maple Mirza, P.A.              DATE OF BIRTH:  [birth year]   DATE OF ADMISSION:  05/10/2003  DATE OF DISCHARGE:  05/11/2003                                 DISCHARGE SUMMARY   DISCHARGE DIAGNOSES:  1. Exertional angina with progressive dyspnea.  2. History of coronary artery disease with prior percutaneous coronary     intervention of the right coronary artery and prior inferior myocardial     infarction.  3. Catheterization May 10, 2003, with stenoses at the mid left anterior     descending and in the proximal circumflex reduced with stents in each     case.   SECONDARY DIAGNOSES:  1. History of insulin-dependent diabetes mellitus x20 years.  2. Hypertension.  3. Dyslipidemia.  4. Claudication symptoms.   PROCEDURE:  May 10, 2003, left heart catheterization with stenting of  the mid LAD reducing an 80% stenosis to 10% stenosis.  Also, stenting of the  proximal circumflex reducing a 90% stenosis to less than 10% stenosis.  The  right coronary artery is occluded at mid point.  This is a chronic occlusion  notable since July 1996.  There is residual disease in the second diagonal  which has a 70% mid point stenosis.  Proximal LAD prior to the stent is a  40% stenosis and the ostial left circumflex had a 30% stenosis.  Ejection  fraction estimated at 50 to 55%.  There is inferobasilar  hypokinesis/akinesis.  Distal aortogram was performed on May 10, 2003,  showing no significant aortoiliac occlusive disease.   DISPOSITION:  The patient is ready for discharge May 10, 2003, post  procedure day #1.  He has been afebrile this hospitalization.  He has had no  recurrent chest pain after the procedure.  His catheterization site at the  right groin  is without bruit, without swelling, without tenderness.  He has  good perfusion to the right lower extremity.  Troponin studies have been low  this hospitalization.  CKs have been elevated but are trending down on the  morning of discharge.   DISCHARGE MEDICATIONS:  1. Enteric coated aspirin 325 mg daily.  2. Plavix 75 mg daily for a six month course.  3. Metformin.  He will restart this Friday, April 12, 2003.  4. Toprol XL 100 mg daily.  5. Actos 45 mg daily.  6. Zocor 80 mg daily.  7. Zetia 10 mg daily.  8. He is to take both of these medications until he runs out and then he     will start on Vitorin as prescribed by Dr. Mikey Bussing.  9. Altace 10 mg twice daily.  10.      Glyburide 5 mg twice daily.  11.      Insulin as before the hospitalization.  12.      Nitroglycerin 0.4 mg one tablet under the tongue every five minutes     x3 doses as needed for chest pain.  13.      Hydrochlorothiazide 25 mg tablets one half tablet daily.  14.      Questran one scoop daily until he starts Vitorin.  15.      Pain management is with Tylenol 325 mg one to two tablets q.4-6h.     p.r.n.   He is asked not to drive for the next two days.  He is asked not to lift  anything more than 10 pounds for the next week.  He is to remain on his low  sodium, low cholesterol, diabetic diet.  He is to call the office at [phone] if he experiences increased pain or swelling or bleeding at the groin  site.  He is also enrolled in the SEPIA trial.  [facility] will call  to schedule a 14-day follow-up appointment.  He also has office visit with  Dr. Jens Som Friday, June 03, 2003, at 4:15 p.m., at [facility],  [place], [place], office.  He is to follow up with Dr. Mikey Bussing at  [facility], [facility], as needed.  He is able to resume work Monday,  May 16, 2003.   HISTORY OF PRESENT ILLNESS:  Jordan [name] is a 70 year old male with a past  medical history of coronary artery  disease.  He has had a prior inferior  myocardial infarction and PCI of the right coronary artery.  His most recent  left heart catheterization was performed October 10, 1994.  The left main was  normal.  The LAD had a 75 to 80% stenosis at the level of the septum.  The  circumflex was without irregularity.  The right coronary artery was totally  occluded after an acute marginal.  There was some filming of the posterior  descending coronary artery by way of collaterals from the left.  Overall  left ventricular function preserved.  There was hypokinesis of the  inferobasilar segment.  The patient, since 1996, has been treated medically.  He has done well until the past six to eight months.  Since then, he has  developed some dyspnea on exertion.  He does not have pedal edema, orthopnea  or paroxysmal nocturnal dyspnea.  He also has substernal chest tightness  with exertion.  It is relieved with rest.  He has had no symptoms of angina  at rest.  Because of the symptoms, [facility] was asked to evaluate  further.  He did have an echocardiogram  at the [facility]  on April 27, 2003, interpreted by Dr. Andrey Campanile.  Ejection fraction  at that time was 55% with inferior akinesis.  There was some impaired left  ventricular relaxation.  The patient will present for elective left heart  catheterization as well as distal aortogram.   HOSPITAL COURSE:  The patient was admitted to [facility] May 10, 2003.  Left heart catheterization was undertaken by Dr.  Charlies Constable.  The study showed that he had significant stenosis in the mid  point LAD of 80% and the stenosis in the proximal left circumflex at 90%.  Both of these were reduced to 10% or less with stenting.  The ejection  fraction was 50 to 55%.  The patient tolerated the procedure  well.  He had no post procedure complications as noted in discharge disposition.  The  patient is ready for  discharge May 11, 2003.  He goes home on Plavix and  aspirin in addition to his regular medications.   LABORATORY DATA:  May 11, 2003, complete blood count with white cells  5.2, hemoglobin 11.1, hematocrit 33, platelets 228.  Serum electrolytes on  May 11, 2003, with sodium 136, potassium 4, chloride 103, chloride 27,  BUN 13, creatinine 1.1, glucose 190.  Serial cardiac enzymes are as follows:  On May 10, 2003, at 11 o'clock in the morning, the CK was 526, CK-MB  919, troponin I 0.01.  On May 10, 2003, at 1625, CK was 533, CK-MB  9.5, troponin I 0.01.  On May 10, 2003, at 2200, CK was 449, CK-MB 6.4,  troponin I 0.04.  On the morning of May 11, 2003, at 0700, CK was 362.   Once again, Jordan [name] goes home with the medications and follow-up with Dr.  Olga Millers and Dr. Mikey Bussing.                                                Maple Mirza, P.A.    GM/MEDQ  D:  05/11/2003  T:  05/11/2003  Job:  [id number]   cc:   Olga Millers, M.D.   Wannetta Sender.  [address]  [place]  [place] [address]  Fax: [phone]

## 2010-08-17 NOTE — Assessment & Plan Note (Signed)
[facility]                            CARDIOLOGY OFFICE NOTE   EDGARD, DEBORD                        MRN:          [id number]  DATE:02/27/2006                            DOB:          19-Sep-1940    Mr. Martensen returns for followup today. He has a history of coronary  disease as outlined in the previous notes. His most recent nuclear study  was performed in June. He had an ejection fraction of 48%. There was a  small prior inferior infarct but no ischemia. He also had an  echocardiogram performed that showed normal LV function. There was mild  hypokinesis of the basal posterior wall. There was mild mitral  regurgitation. He also has cerebral vascular disease. Since I last saw  him, he does have dyspnea on exertion, but there is no orthopnea, PND,  pedal edema, palpitations, presyncope, syncope or chest pain.   His medications include:  1. Metformin 1000 mg p.o. b.i.d.  2. Toprol 150 mg p.o. daily.  3. Actos 45 mg daily.  4. Altace 10 mg p.o. b.i.d.  5. Glyburide 5 mg tablets 2 p.o. b.i.d.  6. Aspirin 325 mg 2 daily.  7. Plavix 75 mg p.o. daily.  8. Insulin.  9. Crestor 20 mg p.o. daily.  10.Cymbalta.  11.Nifedipine 30 mg p.o. daily.   His physical exam today shows a blood pressure of 120/72, and his pulse  is 68. He weighs 222 pounds.  His neck is supple.  His chest is clear.  His cardiovascular exam has a regular rate and rhythm.  His extremities showed no edema.   Electrocardiogram showed a sinus rhythm at a rate of 68. There is a  prior inferior infarct, and there is lateral T-wave changes.   DIAGNOSES:  1. Coronary artery disease.  2. Cerebrovascular disease.  3. Diabetes mellitus.  4. Hypertension.  5. Hyperlipidemia.  6. Peripheral vascular disease.   PLAN:  Mr. Higginson is doing well from a symptomatic standpoint, and his  recent nuclear study showed infarct but no ischemia, and he does have  preserved LV function. We will  continue with medical therapy. I will  have his most recent lipids and liver forwarded to [place] from Dr. Neita Garnet  office for our records. His blood pressure is well controlled. He does  exercise and follows a diet. He does not smoke. He will see [place] back in 9  months.     Madolyn Frieze Jens Som, MD, [facility]  Electronically Signed    BSC/MedQ  DD: 02/27/2006  DT: 02/28/2006  Job #: [id number]   cc:   Wannetta Sender.

## 2010-08-17 NOTE — Consult Note (Signed)
NAME:  Jordan [name], Jordan [name]                        ACCOUNT NO.:  [id number]   MEDICAL RECORD NO.:  [id number]                   PATIENT TYPE:  OIB   LOCATION:  6533                                 FACILITY:  [facility]   PHYSICIAN:  Salvadore Farber, M.D.             DATE OF BIRTH:  September 02, 1940   DATE OF CONSULTATION:  05/11/2003  DATE OF DISCHARGE:                                   CONSULTATION   REQUESTING PHYSICIAN:  Charlies Constable, M.D.   REASON FOR CONSULTATION:  Bilateral calf discomfort with exertion.   HISTORY OF PRESENT ILLNESS:  Jordan [name] is a 70 year old gentleman with  longstanding diabetes mellitus as well as hypertension, dyslipidemia, and  coronary artery disease.  He is status post distant inferior myocardial  infarction.  He was admitted to hospital yesterday after diagnostic  catheterization and multivessel coronary intervention performed by Dr.  Juanda Chance for stable class III angina.   Jordan [name] describes a several-year history of bilateral calf cramping  with approximately a half-mile of walking.  He works as a Geophysicist/field seismologist  for [employer].  He has one child he pushes up a ramp on a  wheelchair and does sometimes develop some calf discomfort with this  exertion.  He has infrequent nocturnal cramping in his calves which he has  had for many years.  There is no nocturnal foot pain.  He has had no  ulceration.  He denies any buttock or thigh pain as well as TIA and stroke  symptoms.   PAST MEDICAL HISTORY:  1. Diabetes mellitus x20 years, recently started on insulin.  2. Hypertension.  3. Dyslipidemia.  4. Coronary artery disease, status post inferior myocardial infarction, EF     50-55%.   ALLERGIES:  No known drug allergies.   CURRENT MEDICATIONS:  Metformin, Toprol-XL, Actos, Zocor, Altace, glyburide,  aspirin, insulin, Plavix.   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  Denies tobacco and  alcohol use.   FAMILY HISTORY:  Negative for premature atherosclerotic disease.   REVIEW OF SYSTEMS:  Negative in detail except as above.   PHYSICAL EXAMINATION:  GENERAL:  He is well-appearing in no distress.  VITAL SIGNS:  Heart rate 77, blood pressure 132/60.  NECK:  He is no jugular venous distention.  LUNGS:  Clear to auscultation.  CARDIAC:  He has a nondisplaced point of maximal cardiac impulse.  There is  a 2/6 systolic ejection murmur at both upper sternal borders.  No S3 or S4.  ABDOMEN:  Soft, nondistended, and nontender.  There is no  hepatosplenomegaly.  Bowel sounds are normal.  There is no mass.  No  abdominal bruit.  EXTREMITIES:  Warm without clubbing, cyanosis, edema, or ulceration.  Feet  are hairless.  Nails are normal.  There is no rubor.  PULSES:  Carotid pulses are 2+ bilaterally without bruits.  Femoral pulses  are  2+ on the right and 1+ on the left without bruit.  Popliteal pulses are  1+ bilaterally.  DP and PT pulses are dopplerable bilaterally.  NEUROLOGIC:  Sensation is impaired in the feet.   IMPRESSION/RECOMMENDATION:  A 70 year old gentleman with longstanding  diabetes mellitus, hypertension, and dyslipidemia presents with many years  of exertional calf claudication.  By physical exam this appears to be  primarily due to tibioperoneal disease.  Symptoms are only mildly lifestyle  limiting.  Will treat with an exercise program which I have outlined to him  in detail.  Since the ejection fraction is preserved, will also initiate  Pletal 100 mg twice per day.  I will have him follow up in the office to  check baseline ABIs and duplex ultrasonography to better delineate his  levels of disease.  I will then see him in the office on May 30, 2003  for further assessment and encouragement in the exercise program.   Thank you for allowing me to participate in Jordan [name] care.                                               Salvadore Farber, M.D.     WED/MEDQ  D:  05/11/2003  T:  05/11/2003  Job:  [id number]   cc:   Olga Millers, M.D.   Wannetta Sender.  [address]  [place]  [place] [address]  Fax: [phone]

## 2010-12-19 ENCOUNTER — Encounter: Payer: Self-pay | Admitting: Physician Assistant

## 2010-12-19 ENCOUNTER — Other Ambulatory Visit: Payer: Self-pay | Admitting: Physician Assistant

## 2010-12-19 ENCOUNTER — Ambulatory Visit (INDEPENDENT_AMBULATORY_CARE_PROVIDER_SITE_OTHER): Payer: Medicare Other | Admitting: Physician Assistant

## 2010-12-19 ENCOUNTER — Telehealth: Payer: Self-pay | Admitting: Cardiology

## 2010-12-19 DIAGNOSIS — R109 Unspecified abdominal pain: Secondary | ICD-10-CM | POA: Insufficient documentation

## 2010-12-19 DIAGNOSIS — R011 Cardiac murmur, unspecified: Secondary | ICD-10-CM

## 2010-12-19 DIAGNOSIS — I2510 Atherosclerotic heart disease of native coronary artery without angina pectoris: Secondary | ICD-10-CM

## 2010-12-19 DIAGNOSIS — I10 Essential (primary) hypertension: Secondary | ICD-10-CM

## 2010-12-19 LAB — HEPATIC FUNCTION PANEL
ALT: 56 U/L — ABNORMAL HIGH (ref 0–53)
ALT: 50 U/L (ref 0–53)
AST: 53 U/L — ABNORMAL HIGH (ref 0–37)
AST: 49 U/L — ABNORMAL HIGH (ref 0–37)
Albumin: 4.4 g/dL (ref 3.5–5.2)
Albumin: 4.7 g/dL (ref 3.5–5.2)
Alkaline Phosphatase: 82 U/L (ref 39–117)
Alkaline Phosphatase: 78 U/L (ref 39–117)
Bilirubin, Direct: <0.1 mg/dL (ref 0.0–0.3)
Bilirubin, Direct: 0.2 mg/dL (ref 0.0–0.3)
Indirect Bilirubin: 0.2 mg/dL (ref 0.0–0.9)
Total Bilirubin: 0.3 mg/dL (ref 0.3–1.2)
Total Bilirubin: 0.4 mg/dL (ref 0.3–1.2)
Total Protein: 8.3 g/dL (ref 6.0–8.3)
Total Protein: 7.7 g/dL (ref 6.0–8.3)

## 2010-12-19 LAB — BASIC METABOLIC PANEL
BUN: 18 mg/dL (ref 6–23)
BUN: 18 mg/dL (ref 6–23)
CO2: 31 mEq/L (ref 19–32)
CO2: 26 mEq/L (ref 19–32)
Calcium: 11 mg/dL — ABNORMAL HIGH (ref 8.4–10.5)
Calcium: 10.2 mg/dL (ref 8.4–10.5)
Chloride: 97 mEq/L (ref 96–112)
Chloride: 99 mEq/L (ref 96–112)
Creat: 1.27 mg/dL (ref 0.50–1.35)
Creat: 1.27 mg/dL (ref 0.50–1.35)
Glucose, Bld: 167 mg/dL — ABNORMAL HIGH (ref 70–99)
Glucose, Bld: 162 mg/dL — ABNORMAL HIGH (ref 70–99)
Potassium: 3.9 mEq/L (ref 3.5–5.3)
Potassium: 4.1 mEq/L (ref 3.5–5.3)
Sodium: 137 mEq/L (ref 135–145)
Sodium: 137 mEq/L (ref 135–145)

## 2010-12-19 LAB — CBC WITH DIFFERENTIAL/PLATELET
Basophils Absolute: 0 10*3/uL (ref 0.0–0.1)
Basophils Relative: 1 % (ref 0–1)
Eosinophils Absolute: 0.3 10*3/uL (ref 0.0–0.7)
Eosinophils Relative: 4 % (ref 0–5)
HCT: 41.6 % (ref 39.0–52.0)
Hemoglobin: 14.3 g/dL (ref 13.0–17.0)
Lymphocytes Relative: 34 % (ref 12–46)
Lymphs Abs: 2.3 10*3/uL (ref 0.7–4.0)
MCH: 29.1 pg (ref 26.0–34.0)
MCHC: 34.4 g/dL (ref 30.0–36.0)
MCV: 84.6 fL (ref 78.0–100.0)
Monocytes Absolute: 0.6 10*3/uL (ref 0.1–1.0)
Monocytes Relative: 8 % (ref 3–12)
Neutro Abs: 3.7 10*3/uL (ref 1.7–7.7)
Neutrophils Relative %: 53 % (ref 43–77)
Platelets: 251 10*3/uL (ref 150–400)
RBC: 4.92 MIL/uL (ref 4.22–5.81)
RDW: 15.5 % (ref 11.5–15.5)
WBC: 6.9 10*3/uL (ref 4.0–10.5)

## 2010-12-19 LAB — AMYLASE
Amylase: 42 U/L (ref 0–105)
Amylase: 31 U/L (ref 0–105)

## 2010-12-19 LAB — LIPASE
Lipase: 29 U/L (ref 11–59)
Lipase: 35 U/L (ref 0–75)

## 2010-12-19 LAB — TSH: TSH: 3.122 u[IU]/mL (ref 0.350–4.500)

## 2010-12-19 MED ORDER — LOSARTAN POTASSIUM 50 MG PO TABS: 50.0000 mg | ORAL_TABLET | Freq: Every day | ORAL | Status: DC

## 2010-12-19 NOTE — Assessment & Plan Note (Signed)
Etiology unclear.  I question whether or not the pain is contributing to his blood pressure elevations.  His symptoms are somewhat concerning for nephrolithiasis, however they're not classic.  He has never had a kidney stone.  I will have him undergo lab work: Basic metabolic panel, CBC, LFTs, TSH, amylase, lipase.  I will also have him undergo abdominal and pelvic CT with and without contrast.  I will also obtain urinalysis.  He has follow up with his PCP next week and he should keep that appointment.  I will give him a one-time prescription for Vicodin to use as needed for pain.

## 2010-12-19 NOTE — Telephone Encounter (Signed)
Pt b/p is elevated and has been like this for 3days he has tried to see his pcp and can't get an appt so he was wondering if we could help him. He just took and it was160/78 and had been running in the high 180's and he is feeling nausea like he did when he had a blockage last time. He also has been dizzy. Pt is hurting in his side

## 2010-12-19 NOTE — Telephone Encounter (Signed)
Spoke with pt who reports blood pressures as below. Has had off and on nausea for 3 days. One episode of shortness of breath last week. Pain in lower back for about 6 weeks. Also with intermittent dizziness but none at present time. No chest pain or shortness of breath at present. Appt made for pt to see Lilian Coma today at 2:30

## 2010-12-19 NOTE — Patient Instructions (Signed)
Your physician recommends that you schedule a follow-up appointment in: 2-3 WEEKS WITH DR. CRENSHAW AS PER SCOTT WEAVER, PA  Non-Cardiac CT scanning ABDOMEN/PELVIC CT WITH AND WITHOUT CONTRAST  DX 789.00 FLANK PAIN, (CAT scanning), is a noninvasive, special x-ray that produces cross-sectional images of the body using x-rays and a computer. CT scans help physicians diagnose and treat medical conditions. For some CT exams, a contrast material is used to enhance visibility in the area of the body being studied. CT scans provide greater clarity and reveal more details than regular x-ray exams.  Your physician has requested that you have en exercise stress myoview DX CAD 414.00. For further information please visit [email or web address] Please follow instruction sheet, as given.  Your physician has requested that you have an echocardiogram DX  MURMUR 780.4. Echocardiography is a painless test that uses sound waves to create images of your heart. It provides your doctor with information about the size and shape of your heart and how well your heart's chambers and valves are working. This procedure takes approximately one hour. There are no restrictions for this procedure.  Your physician recommends that you return for lab work in: TODAY BMET, CBC W/DIFF, LFT'S, AMYLASE, LIPASE, UA 789.00 FLANK PAIN  Your physician has recommended you make the following change in your medication: YOU HAVE BEEN GIVEN A PRESCRIPTION FOR VICODIN 5/500 1 TABLET TWICE DAILY AS NEEDED ONLY.   YOU HAVE BEEN GIVEN A PRESCRIPTION TO HAVE BLOOD WORK DONE @ YOUR PCP OFFICE

## 2010-12-19 NOTE — Assessment & Plan Note (Signed)
Blood pressure may be elevated in response to pain.  However, he is symptomatic from this with dizziness.  I will have him start Cozaar 50 mg a day.  Repeat basic metabolic panel in one week.

## 2010-12-19 NOTE — Progress Notes (Signed)
History of Present Illness: Primary Cardiologist: Dr. Olga Millers  Jordan [name] is a 70 y.o. male with a history of coronary artery disease s/p prior PCI.  Last cardiac catheterization was performed on April 18, 2008. At that time, he was found to have normal left main. The LAD had a 40-50% lesion after the second diagonal, but the stent was widely patent.  In the second diagonal, there was a 60-70% lesion, which did not appear to be flow limiting. There was a 30% circumflex. The right coronary artery was occluded. The distal right filled via left-to-right collaterals. The ejection fraction was 50%. Medical therapy was recommended. Last echocardiogram was performed in June of 2007. LV function was normal, there was mild left atrial enlargement and mild mitral regurgitation. Last carotid Dopplers were performed in May 2011. There was 40-59% bilateral stenosis and followup as right recommended in 2 years. He was last seen 04/2010.  He presents today with complaints of right flank pain for the last 6 weeks.  This is worse with positional changes.  He denies any radiation to his groin.  He denies any difficulty with urination.  He may be urinating more frequently.  He denies any burning or urgency or hematuria.  He denies pleuritic symptoms.  He has felt somewhat nauseated.  He denies vomiting.  His blood pressure has been running up.  He recorded his BP at 160-180 systolically at home over the last few days.  He's felt somewhat lightheaded with this.  He denies any chest pain.  He denies shortness of breath.  However, one day approximately one month ago, he became acutely short of breath while lifting 50 pounds while on a ladder.  This was somewhat concerning for him.  He has not had recurrent symptoms since that time.  He denies orthopnea, PND or edema.  He describes NYHA class II symptoms.  He denies palpitations.  Past Medical History  Diagnosis Date  . CAD (coronary artery disease)     a. s/p IMI in  past tx with POBA and cath 6 mos later with occluded RCA;  b. h/o Taxus DES to LAD and CFX;  c.  cath 1/10: LM ok, mLAD 40-50%, LAD stent ok, D2 tandem 60-70%, pCFX 30%, mAVCFX stent ok, pRCA 40%, RV 90%, RV marginal 90%, dRCA filled L-R collats tx medically  . Carotid stenosis     dopplers 5/11: 40-59% bilat  . DM2 (diabetes mellitus, type 2)   . HTN (hypertension)   . HLD (hyperlipidemia)     Current Outpatient Prescriptions  Medication Sig Dispense Refill  . aspirin 81 MG tablet Take 81 mg by mouth daily.        . insulin aspart (NOVOLOG) 100 UNIT/ML injection Inject into the skin 3 (three) times daily before meals.        . isosorbide mononitrate (IMDUR) 30 MG 24 hr tablet TAKE 1 TABLET ONCE DAILY  90 tablet  2  . metformin (FORTAMET) 1000 MG (OSM) 24 hr tablet Take 1,000 mg by mouth 2 (two) times daily.        . metoprolol (LOPRESSOR) 100 MG tablet Take 100 mg by mouth 2 (two) times daily.        . nitroGLYCERIN (NITROSTAT) 0.4 MG SL tablet Place 0.4 mg under the tongue every 5 (five) minutes as needed.        . rosuvastatin (CRESTOR) 20 MG tablet Take 20 mg by mouth daily.        . saxagliptin HCl (ONGLYZA)  5 MG TABS tablet Take 5 mg by mouth daily.          Allergies: No Known Allergies He reports a cough with ramipril  Social history:  Nonsmoker  ROS:  Please see the history of present illness.  He denies fevers, chills, vomiting, diarrhea, melena, hematochezia.  He denies weight changes.  He notes a recent history of a tick bite treated with doxycycline.  All other systems reviewed and negative.   Vital Signs: BP 152/90  Pulse 64  Ht 5\' 9"  (1.753 m)  Wt 224 lb (101.606 kg)  BMI 33.08 kg/m2  PHYSICAL EXAM: Well nourished, well developed, in no acute distress HEENT: normal Neck: no JVD Vascular: I cannot appreciate any carotid bruits Endocrine: No thyromegaly Cardiac:  normal S1, S2; RRR; 1-2/6 systolic murmur along the left sternal border Lungs:  clear to  auscultation bilaterally, no wheezing, rhonchi or rales Abd: Normoactive bowel sounds, no bruits, soft, no hepatomegaly, his right kidney is possibly palpable with reproducible pain over that area Musculoskeletal: No CVA tenderness bilaterally Ext: no edema Skin: warm and dry Neuro:  CNs 2-12 intact, no focal abnormalities noted Psych: Normal affect  EKG:  Sinus rhythm heart rate 64, left axis deviation, T wave inversions in one and aVL, poor R-wave progression, no significant change when compared to prior tracings  ASSESSMENT AND PLAN:

## 2010-12-19 NOTE — Assessment & Plan Note (Signed)
This was noted at his last visit.  The plan was to proceed with echocardiogram at his followup visit.  We will go ahead and arrange that test now.

## 2010-12-19 NOTE — Assessment & Plan Note (Signed)
He was to have followup Myoview in January when he returns.  He is somewhat concerned his symptoms currently may be related to his heart.  However, he denies any symptoms reminiscent of his previous angina.  In any event, I will go ahead and set him up for a stress Myoview.  He can followup with Dr. Jens Som in the next few weeks.

## 2010-12-20 ENCOUNTER — Ambulatory Visit (INDEPENDENT_AMBULATORY_CARE_PROVIDER_SITE_OTHER)
Admission: RE | Admit: 2010-12-20 | Discharge: 2010-12-20 | Disposition: A | Discharge status: Home / Self Care | Payer: Medicare Other | Source: Ambulatory Visit | Attending: Physician Assistant | Admitting: Physician Assistant

## 2010-12-20 DIAGNOSIS — R109 Unspecified abdominal pain: Secondary | ICD-10-CM

## 2010-12-20 LAB — URINALYSIS
Bilirubin Urine: NEGATIVE
Hgb urine dipstick: NEGATIVE
Ketones, ur: NEGATIVE
Leukocytes, UA: NEGATIVE
Nitrite: NEGATIVE
Specific Gravity, Urine: 1.025 (ref 1.000–1.030)
Total Protein, Urine: NEGATIVE
Urine Glucose: 250
Urobilinogen, UA: 0.2 (ref 0.0–1.0)
pH: 5.5 (ref 5.0–8.0)

## 2010-12-20 MED ORDER — IOHEXOL 300 MG/ML  SOLN: 100.0000 mL | Freq: Once | INTRAMUSCULAR | Status: AC | PRN

## 2010-12-24 ENCOUNTER — Ambulatory Visit (HOSPITAL_COMMUNITY): Payer: Medicare Other | Attending: Cardiology | Admitting: Radiology

## 2010-12-24 ENCOUNTER — Encounter: Payer: Self-pay | Admitting: *Deleted

## 2010-12-24 ENCOUNTER — Other Ambulatory Visit (HOSPITAL_COMMUNITY): Payer: Self-pay | Admitting: Cardiology

## 2010-12-24 ENCOUNTER — Ambulatory Visit (HOSPITAL_BASED_OUTPATIENT_CLINIC_OR_DEPARTMENT_OTHER): Payer: Medicare Other | Admitting: Radiology

## 2010-12-24 VITALS — Ht 69.0 in | Wt 223.0 lb

## 2010-12-24 DIAGNOSIS — E119 Type 2 diabetes mellitus without complications: Secondary | ICD-10-CM | POA: Insufficient documentation

## 2010-12-24 DIAGNOSIS — E785 Hyperlipidemia, unspecified: Secondary | ICD-10-CM | POA: Insufficient documentation

## 2010-12-24 DIAGNOSIS — R079 Chest pain, unspecified: Secondary | ICD-10-CM

## 2010-12-24 DIAGNOSIS — I6529 Occlusion and stenosis of unspecified carotid artery: Secondary | ICD-10-CM | POA: Insufficient documentation

## 2010-12-24 DIAGNOSIS — I379 Nonrheumatic pulmonary valve disorder, unspecified: Secondary | ICD-10-CM | POA: Insufficient documentation

## 2010-12-24 DIAGNOSIS — I2510 Atherosclerotic heart disease of native coronary artery without angina pectoris: Secondary | ICD-10-CM

## 2010-12-24 DIAGNOSIS — I080 Rheumatic disorders of both mitral and aortic valves: Secondary | ICD-10-CM | POA: Insufficient documentation

## 2010-12-24 DIAGNOSIS — R109 Unspecified abdominal pain: Secondary | ICD-10-CM

## 2010-12-24 DIAGNOSIS — R0789 Other chest pain: Secondary | ICD-10-CM

## 2010-12-24 DIAGNOSIS — I10 Essential (primary) hypertension: Secondary | ICD-10-CM | POA: Insufficient documentation

## 2010-12-24 DIAGNOSIS — R011 Cardiac murmur, unspecified: Secondary | ICD-10-CM

## 2010-12-24 DIAGNOSIS — I079 Rheumatic tricuspid valve disease, unspecified: Secondary | ICD-10-CM | POA: Insufficient documentation

## 2010-12-24 DIAGNOSIS — I319 Disease of pericardium, unspecified: Secondary | ICD-10-CM | POA: Insufficient documentation

## 2010-12-24 MED ORDER — TECHNETIUM TC 99M TETROFOSMIN IV KIT
11.0000 | PACK | Freq: Once | INTRAVENOUS | Status: AC | PRN
  Administered 2010-12-24: 11 via INTRAVENOUS

## 2010-12-24 MED ORDER — TECHNETIUM TC 99M TETROFOSMIN IV KIT
33.0000 | PACK | Freq: Once | INTRAVENOUS | Status: AC | PRN
  Administered 2010-12-24: 33 via INTRAVENOUS

## 2010-12-24 MED ORDER — REGADENOSON 0.4 MG/5ML IV SOLN
0.4000 mg | Freq: Once | INTRAVENOUS | Status: AC
  Administered 2010-12-24: 0.4 mg via INTRAVENOUS

## 2010-12-24 NOTE — Progress Notes (Signed)
[facility] SITE 3 NUCLEAR MED [address] [place] [address] [phone]  Cardiology Nuclear Med Study  Jordan [name] is a 70 y.o. male [id number] Sep 19, 1940   Nuclear Med Background Indication for Stress Test:  Evaluation for Ischemia and Stent Patency History: 07 Echo: mild MR,10 Heart Catheterization:multivessell disease with collaterals and nonobstructive and Myocardial Infarction with old inferior MI,09 MPS: EF=45% and very mild inferobasal ischemia and 05 Stents:LAD and CFX Cardiac Risk Factors: Carotid Disease with 40-59% Bilateral, Claudication, Hypertension, Lipids and PVD  Symptoms:  Chest Tightness with high BP(last date of chest discomfort 2days ago), DOE, Light-Headedness with high BP and Near Syncope 2 wks ago   Nuclear Pre-Procedure Caffeine/Decaff Intake:  None NPO After: 5:30am   Lungs:  clear IV 0.9% NS with Angio Cath:  22g  IV Site: R Hand  IV Started by:  Cathlyn Parsons, RN  Chest Size (in):  44 Cup Size: n/a  Height: 5\' 9"  (1.753 m)  Weight:  223 lb (101.152 kg)  BMI:  Body mass index is 32.93 kg/(m^2). Tech Comments:  Blood sugar 144 and pt took Novolog insulin and Metformin    Nuclear Med Study 1 or 2 day study: 1 day  Stress Test Type:  Treadmill/Lexiscan  Reading MD: Charlton Haws, MD  Order Authorizing Provider:  Olga Millers / Tereso Newcomer  Resting Radionuclide: Technetium 3m Tetrofosmin  Resting Radionuclide Dose: 11 mCi   Stress Radionuclide:  Technetium 76m Tetrofosmin  Stress Radionuclide Dose: 33 mCi           Stress Protocol Rest HR: 61 Stress HR: 99  Rest BP: 132/76 Stress BP: 150/60  Exercise Time (min): METS: 1.7   Predicted Max HR: 151 bpm % Max HR: 65.56 bpm Rate Pressure Product: 46962   Dose of Adenosine (mg):  n/a Dose of Lexiscan: 0.4 mg  Dose of Atropine (mg): n/a Dose of Dobutamine: n/a mcg/kg/min (at max HR)  Stress Test Technologist: Cathlyn Parsons, RN  Nuclear Technologist:   Domenic Polite, CNMT     Rest Procedure:  Myocardial perfusion imaging was performed at rest 45 minutes following the intravenous administration of Technetium 39m Tetrofosmin. Rest ECG: NSR - Normal EKG with T wave inversion  Stress Procedure:  The patient received IV Lexiscan 0.4 mg over 15-seconds with concurrent low level exercise and then Technetium 8m Tetrofosmin was injected at 30-seconds while the patient continued walking one more minute.  There were no significant changes with Lexiscan.  Quantitative spect images were obtained after a 45-minute delay. Stress ECG: No significant change from baseline ECG and   QPS Raw Data Images:  Normal; no motion artifact; normal heart/lung ratio. Stress Images:  Normal homogeneous uptake in all areas of the myocardium. Rest Images:  Normal homogeneous uptake in all areas of the myocardium. Subtraction (SDS):  Normal Transient Ischemic Dilatation (Normal <1.22):  1.06 Lung/Heart Ratio (Normal <0.45):  .38  Quantitative Gated Spect Images QGS EDV:  101 ml QGS ESV:  51 ml QGS cine images:  EF 50% with apical hypokinesis QGS EF: 50%  Impression Exercise Capacity:  Lexiscan with no exercise. BP Response:  Normal blood pressure response. Clinical Symptoms:  No chest pain. ECG Impression:  No significant ST segment change suggestive of ischemia. Comparison with Prior Nuclear Study: Previous images only in black and white  Overall Impression:  Abnormal myovue Thinning of inferobase with ? mild mid inferior ischemia.  EF 50% with apical hypokinesis.  Previous imnages 2009 EF 45% and ?  inferobasal thinning but only black and white copy seen   Charlton Haws

## 2010-12-25 ENCOUNTER — Other Ambulatory Visit (HOSPITAL_COMMUNITY): Payer: Self-pay | Admitting: Radiology

## 2010-12-26 ENCOUNTER — Encounter: Payer: Self-pay | Admitting: *Deleted

## 2010-12-27 ENCOUNTER — Telehealth: Payer: Self-pay | Admitting: Cardiology

## 2010-12-27 ENCOUNTER — Encounter: Payer: Self-pay | Admitting: *Deleted

## 2010-12-27 NOTE — Telephone Encounter (Signed)
Returning call back to nurse.  

## 2010-12-27 NOTE — Telephone Encounter (Signed)
Spoke with pt, aware of test results Jordan [name]  

## 2011-01-08 ENCOUNTER — Encounter: Payer: Self-pay | Admitting: *Deleted

## 2011-01-09 ENCOUNTER — Encounter: Payer: Self-pay | Admitting: Cardiology

## 2011-01-09 ENCOUNTER — Ambulatory Visit (INDEPENDENT_AMBULATORY_CARE_PROVIDER_SITE_OTHER): Payer: Medicare Other | Admitting: Cardiology

## 2011-01-09 DIAGNOSIS — I2510 Atherosclerotic heart disease of native coronary artery without angina pectoris: Secondary | ICD-10-CM

## 2011-01-09 DIAGNOSIS — I350 Nonrheumatic aortic (valve) stenosis: Secondary | ICD-10-CM | POA: Insufficient documentation

## 2011-01-09 DIAGNOSIS — I679 Cerebrovascular disease, unspecified: Secondary | ICD-10-CM

## 2011-01-09 DIAGNOSIS — I359 Nonrheumatic aortic valve disorder, unspecified: Secondary | ICD-10-CM

## 2011-01-09 DIAGNOSIS — I10 Essential (primary) hypertension: Secondary | ICD-10-CM

## 2011-01-09 DIAGNOSIS — E785 Hyperlipidemia, unspecified: Secondary | ICD-10-CM

## 2011-01-09 NOTE — Assessment & Plan Note (Signed)
Blood pressure controlled. Continue present medications. 

## 2011-01-09 NOTE — Progress Notes (Signed)
HPI:Jordan [name] is a pleasant gentleman with a history of coronary artery disease.  Last cardiac catheterization was performed on April 18, 2008.  At that time, he was found to have normal left main.  The LAD had a 40-50% lesion after the second diagonal, but the stent was widely patent.  In the second diagonal, there was a 60-70% lesion, which did not appear to be flow limiting.  There was a 30% circumflex.  The right coronary artery was occluded.  The distal right filled via left-to-right collaterals.  The ejection fraction was 50%. Medical therapy was recommended. Last echocardiogram was performed in Sept 2012. LV function was normal, there was mild aortic stenosis and trivial AI. Last carotid Dopplers were performed in May 2011. There was 40-59% bilateral stenosis and followup as right recommended in 2 years. Myoview in September 2012 showed an ejection is a 50%. There is inferior thinning with question mild inferior ischemia. Since then the patient has dyspnea with more extreme activities but not with routine activities. It is relieved with rest. It is not associated with chest pain. There is no orthopnea, PND or pedal edema. There is no syncope or palpitations. There is no exertional chest pain.   Current Outpatient Prescriptions  Medication Sig Dispense Refill  . amLODipine (NORVASC) 10 MG tablet Take 10 mg by mouth daily.        [name] aspirin 81 MG tablet Take 81 mg by mouth daily.        . insulin aspart (NOVOLOG) 100 UNIT/ML injection Inject into the skin as directed.       . isosorbide mononitrate (IMDUR) 30 MG 24 hr tablet TAKE 1 TABLET ONCE DAILY  90 tablet  2  . losartan (COZAAR) 50 MG tablet Take 100 mg by mouth daily.        . metformin (FORTAMET) 1000 MG (OSM) 24 hr tablet Take 1,000 mg by mouth 2 (two) times daily.        . metoprolol (TOPROL-XL) 100 MG 24 hr tablet Take 100 mg by mouth 2 (two) times daily.        . nitroGLYCERIN (NITROSTAT) 0.4 MG SL tablet Place 0.4 mg under the tongue  every 5 (five) minutes as needed.        . rosuvastatin (CRESTOR) 20 MG tablet Take 20 mg by mouth daily.        . saxagliptin HCl (ONGLYZA) 5 MG TABS tablet Take 5 mg by mouth daily.        [name] DISCONTD: losartan (COZAAR) 50 MG tablet Take 1 tablet (50 mg total) by mouth daily.  30 tablet  11     Past Medical History  Diagnosis Date  . CAD (coronary artery disease)     a. s/p IMI in past tx with POBA and cath 6 mos later with occluded RCA;  b. h/o Taxus DES to LAD and CFX;  c.  cath 1/10: LM ok, mLAD 40-50%, LAD stent ok, D2 tandem 60-70%, pCFX 30%, mAVCFX stent ok, pRCA 40%, RV 90%, RV marginal 90%, dRCA filled L-R collats tx medically  . Carotid stenosis     dopplers 5/11: 40-59% bilat  . DM2 (diabetes mellitus, type 2)   . HTN (hypertension)   . HLD (hyperlipidemia)   . Diabetes mellitus     Past Surgical History  Procedure Date  . Cardiac catheterization     History   Social History  . Marital Status: Married    Spouse Name: N/A    Number  of Children: N/A  . Years of Education: N/A   Occupational History  . Not on file.   Social History Main Topics  . Smoking status: Former Games developer  . Smokeless tobacco: Not on file  . Alcohol Use: Not on file  . Drug Use: Not on file  . Sexually Active: Not on file   Other Topics Concern  . Not on file   Social History Narrative  . No narrative on file    ROS: no fevers or chills, productive cough, hemoptysis, dysphasia, odynophagia, melena, hematochezia, dysuria, hematuria, rash, seizure activity, orthopnea, PND, pedal edema, claudication. Remaining systems are negative.  Physical Exam: Well-developed well-nourished in no acute distress.  Skin is warm and dry.  HEENT is normal.  Neck is supple. No thyromegaly.  Chest is clear to auscultation with normal expansion.  Cardiovascular exam is regular rate and rhythm. 2/6 systolic murmur left sternal border. Abdominal exam nontender or distended. No masses palpated. Extremities  show no edema. neuro grossly intact

## 2011-01-09 NOTE — Assessment & Plan Note (Signed)
Aortic stenosis only mild at present. He will need followup echocardiogram in the future.

## 2011-01-09 NOTE — Assessment & Plan Note (Signed)
Continue medical therapy as Jordan [name] is low risk. Continue aspirin and statin.

## 2011-01-09 NOTE — Assessment & Plan Note (Signed)
Continue aspirin and statin. Followup carotid Dopplers May 2013. 

## 2011-01-09 NOTE — Patient Instructions (Signed)
Your physician wants you to follow-up in: ONE YEAR You will receive a reminder letter in the mail two months in advance. If you don't receive a letter, please call our office to schedule the follow-up appointment.   Your physician has requested that you have a carotid duplex. This test is an ultrasound of the carotid arteries in your neck. It looks at blood flow through these arteries that supply the brain with blood. Allow one hour for this exam. There are no restrictions or special instructions. DUE IN MAY 2013

## 2011-01-09 NOTE — Assessment & Plan Note (Signed)
Continue statin. 

## 2011-03-29 ENCOUNTER — Other Ambulatory Visit: Payer: Self-pay | Admitting: Cardiology

## 2011-08-12 ENCOUNTER — Other Ambulatory Visit: Payer: Self-pay | Admitting: Cardiology

## 2011-12-01 ENCOUNTER — Other Ambulatory Visit: Payer: Self-pay | Admitting: Cardiology

## 2012-02-08 ENCOUNTER — Other Ambulatory Visit: Payer: Self-pay | Admitting: Cardiology

## 2012-05-08 ENCOUNTER — Other Ambulatory Visit: Payer: Self-pay | Admitting: Cardiology

## 2013-02-04 ENCOUNTER — Encounter: Payer: Self-pay | Admitting: Cardiology

## 2013-02-05 ENCOUNTER — Encounter: Payer: Self-pay | Admitting: Cardiology

## 2013-02-05 ENCOUNTER — Ambulatory Visit (INDEPENDENT_AMBULATORY_CARE_PROVIDER_SITE_OTHER): Payer: Medicare HMO | Admitting: Cardiology

## 2013-02-05 VITALS — BP 150/100 | HR 76 | Ht 69.0 in | Wt 226.8 lb

## 2013-02-05 DIAGNOSIS — I359 Nonrheumatic aortic valve disorder, unspecified: Secondary | ICD-10-CM

## 2013-02-05 DIAGNOSIS — I739 Peripheral vascular disease, unspecified: Secondary | ICD-10-CM

## 2013-02-05 DIAGNOSIS — I679 Cerebrovascular disease, unspecified: Secondary | ICD-10-CM

## 2013-02-05 DIAGNOSIS — I2510 Atherosclerotic heart disease of native coronary artery without angina pectoris: Secondary | ICD-10-CM

## 2013-02-05 DIAGNOSIS — I10 Essential (primary) hypertension: Secondary | ICD-10-CM

## 2013-02-05 DIAGNOSIS — E785 Hyperlipidemia, unspecified: Secondary | ICD-10-CM

## 2013-02-05 NOTE — Progress Notes (Signed)
HPI: [name] is a pleasant gentleman with a history of coronary artery disease. Last cardiac catheterization was performed on April 18, 2008. At that time, he was found to have normal left main. The LAD had a 40-50% lesion after the second diagonal, but the stent was widely patent. In the second diagonal, there was a 60-70% lesion, which did not appear to be flow limiting. There was a 30% circumflex. The right coronary artery was occluded. The distal right filled via left-to-right collaterals. The ejection fraction was 50%. Medical therapy was recommended. Last echocardiogram was performed in Sept 2012. LV function was normal, there was mild aortic stenosis and trivial AI. Last carotid Dopplers were performed in May 2011. There was 40-59% bilateral stenosis and followup as right recommended in 2 years. Myoview in September 2012 showed an ejection is a 50%. There is inferior thinning with question mild inferior ischemia. I last saw him in Oct 2012. Since then the patient has dyspnea with more extreme activities but not with routine activities. It is relieved with rest. It is not associated with chest pain. There is no orthopnea, PND or pedal edema. There is no syncope or palpitations. There is no exertional chest pain. He has claudication in both legs after walking 15 minutes.   Current Outpatient Prescriptions  Medication Sig Dispense Refill  . amLODipine (NORVASC) 10 MG tablet TAKE 1 TABLET DAILY  90 tablet  0  . aspirin 81 MG tablet Take 81 mg by mouth daily.        . insulin aspart (NOVOLOG) 100 UNIT/ML injection Inject into the skin as directed.       . isosorbide mononitrate (IMDUR) 30 MG 24 hr tablet TAKE 1 TABLET DAILY  90 tablet  0  . losartan (COZAAR) 50 MG tablet Take 100 mg by mouth daily.        . metformin (FORTAMET) 1000 MG (OSM) 24 hr tablet Take 1,000 mg by mouth 2 (two) times daily.        . metoprolol (TOPROL-XL) 100 MG 24 hr tablet Take 100 mg by mouth 2 (two) times  daily.        . nitroGLYCERIN (NITROSTAT) 0.4 MG SL tablet Place 0.4 mg under the tongue every 5 (five) minutes as needed.        . rosuvastatin (CRESTOR) 20 MG tablet Take 20 mg by mouth daily.        . saxagliptin HCl (ONGLYZA) 5 MG TABS tablet Take 5 mg by mouth daily.         No current facility-administered medications for this visit.     Past Medical History  Diagnosis Date  . CAD (coronary artery disease)     a. s/p IMI in past tx with POBA and cath 6 mos later with occluded RCA;  b. h/o Taxus DES to LAD and CFX;  c.  cath 1/10: LM ok, mLAD 40-50%, LAD stent ok, D2 tandem 60-70%, pCFX 30%, mAVCFX stent ok, pRCA 40%, RV 90%, RV marginal 90%, dRCA filled L-R collats tx medically  . Carotid stenosis     dopplers 5/11: 40-59% bilat  . DM2 (diabetes mellitus, type 2)   . HTN (hypertension)   . HLD (hyperlipidemia)   . Diabetes mellitus     Past Surgical History  Procedure Laterality Date  . Cardiac catheterization      History   Social History  . Marital Status: Married    Spouse Name: N/A    Number of  Children: N/A  . Years of Education: N/A   Occupational History  . Not on file.   Social History Main Topics  . Smoking status: Former Games developer  . Smokeless tobacco: Not on file  . Alcohol Use: Not on file  . Drug Use: Not on file  . Sexual Activity: Not on file   Other Topics Concern  . Not on file   Social History Narrative  . No narrative on file    ROS: no fevers or chills, productive cough, hemoptysis, dysphasia, odynophagia, melena, hematochezia, dysuria, hematuria, rash, seizure activity, orthopnea, PND, pedal edema, claudication. Remaining systems are negative.  Physical Exam: Well-developed well-nourished in no acute distress.  Skin is warm and dry.  HEENT is normal.  Neck is supple.  Chest is clear to auscultation with normal expansion.  Cardiovascular exam is regular rate and rhythm. 2/6 systolic murmur left sternal border Abdominal exam nontender  or distended. No masses palpated. Extremities show no edema. Distal pulses not palpable. neuro grossly intact  ECG sinus rhythm at a rate of 71.prior inferior infarct. Lateral T-wave inversion.

## 2013-02-05 NOTE — Assessment & Plan Note (Signed)
Continue statin. Lipids and liver monitored by primary care. 

## 2013-02-05 NOTE — Assessment & Plan Note (Signed)
Repeat echocardiogram. 

## 2013-02-05 NOTE — Assessment & Plan Note (Signed)
Blood pressure mildly elevated but he is not sure he has taken all his medications this morning. He does follow his blood pressure at home and it is typically 140/80. Continue present medications and follow.

## 2013-02-05 NOTE — Assessment & Plan Note (Signed)
Patient has bilateral lower extremity claudication after walking approximately 15 minutes. He had recent ABIs at [facility]. We will have those results sent to [place]. He was like to continue with medical therapy for now. We will consider referral to Dr. Kirke Corin if ABIs are significantly abnormal or his symptoms worsen.

## 2013-02-05 NOTE — Patient Instructions (Signed)
Your physician wants you to follow-up in: ONE YEAR WITH DR CRENSHAW You will receive a reminder letter in the mail two months in advance. If you don't receive a letter, please call our office to schedule the follow-up appointment.   Your physician has requested that you have an echocardiogram. Echocardiography is a painless test that uses sound waves to create images of your heart. It provides your doctor with information about the size and shape of your heart and how well your heart's chambers and valves are working. This procedure takes approximately one hour. There are no restrictions for this procedure.   Your physician has requested that you have a carotid duplex. This test is an ultrasound of the carotid arteries in your neck. It looks at blood flow through these arteries that supply the brain with blood. Allow one hour for this exam. There are no restrictions or special instructions.   

## 2013-02-05 NOTE — Assessment & Plan Note (Signed)
Continue aspirin and statin. 

## 2013-02-05 NOTE — Assessment & Plan Note (Signed)
Continue aspirin and statin. Schedule followup carotid Dopplers. 

## 2013-02-09 ENCOUNTER — Telehealth: Payer: Self-pay | Admitting: Cardiology

## 2013-02-09 NOTE — Telephone Encounter (Signed)
ROI faxed to [facility] at [phone]

## 2013-02-09 NOTE — Telephone Encounter (Signed)
Records rec From [facility] gave to [place] M/[place] 02/09/13/Km

## 2013-02-18 ENCOUNTER — Encounter: Payer: Self-pay | Admitting: Internal Medicine

## 2013-02-18 ENCOUNTER — Ambulatory Visit (HOSPITAL_BASED_OUTPATIENT_CLINIC_OR_DEPARTMENT_OTHER): Payer: Medicare HMO

## 2013-02-18 ENCOUNTER — Ambulatory Visit (HOSPITAL_COMMUNITY): Payer: Medicare HMO | Attending: Cardiology | Admitting: Radiology

## 2013-02-18 DIAGNOSIS — I679 Cerebrovascular disease, unspecified: Secondary | ICD-10-CM

## 2013-02-18 DIAGNOSIS — I059 Rheumatic mitral valve disease, unspecified: Secondary | ICD-10-CM | POA: Insufficient documentation

## 2013-02-18 DIAGNOSIS — R011 Cardiac murmur, unspecified: Secondary | ICD-10-CM | POA: Insufficient documentation

## 2013-02-18 DIAGNOSIS — Z87891 Personal history of nicotine dependence: Secondary | ICD-10-CM | POA: Insufficient documentation

## 2013-02-18 DIAGNOSIS — I6529 Occlusion and stenosis of unspecified carotid artery: Secondary | ICD-10-CM

## 2013-02-18 DIAGNOSIS — I2510 Atherosclerotic heart disease of native coronary artery without angina pectoris: Secondary | ICD-10-CM | POA: Insufficient documentation

## 2013-02-18 DIAGNOSIS — I379 Nonrheumatic pulmonary valve disorder, unspecified: Secondary | ICD-10-CM | POA: Insufficient documentation

## 2013-02-18 DIAGNOSIS — I359 Nonrheumatic aortic valve disorder, unspecified: Secondary | ICD-10-CM | POA: Insufficient documentation

## 2013-02-18 DIAGNOSIS — E785 Hyperlipidemia, unspecified: Secondary | ICD-10-CM | POA: Insufficient documentation

## 2013-02-18 DIAGNOSIS — E119 Type 2 diabetes mellitus without complications: Secondary | ICD-10-CM | POA: Insufficient documentation

## 2013-02-18 DIAGNOSIS — I252 Old myocardial infarction: Secondary | ICD-10-CM | POA: Insufficient documentation

## 2013-02-18 DIAGNOSIS — I739 Peripheral vascular disease, unspecified: Secondary | ICD-10-CM | POA: Insufficient documentation

## 2013-02-18 DIAGNOSIS — I10 Essential (primary) hypertension: Secondary | ICD-10-CM | POA: Insufficient documentation

## 2013-02-18 NOTE — Progress Notes (Signed)
Echocardiogram performed.  

## 2013-02-23 ENCOUNTER — Telehealth: Payer: Self-pay | Admitting: *Deleted

## 2013-02-23 NOTE — Telephone Encounter (Signed)
Left message for pt to call, received LEA results from [facility]. Impression reads; there is significant arterial occlusive disease bilaterally. Dr Jens Som reviewed and would like the patient to see dr Kirke Corin.

## 2013-02-24 NOTE — Telephone Encounter (Signed)
Patient is returning your call. Please call back he will be home all day.

## 2013-02-24 NOTE — Telephone Encounter (Signed)
Spoke with pt, aware of doppler results. Pt will see dr Kirke Corin next week.

## 2013-03-02 ENCOUNTER — Ambulatory Visit (INDEPENDENT_AMBULATORY_CARE_PROVIDER_SITE_OTHER): Payer: Medicare HMO | Admitting: Cardiovascular Disease

## 2013-03-02 ENCOUNTER — Encounter: Payer: Self-pay | Admitting: Cardiovascular Disease

## 2013-03-02 VITALS — BP 134/72 | HR 68 | Ht 69.0 in | Wt 226.0 lb

## 2013-03-02 DIAGNOSIS — I359 Nonrheumatic aortic valve disorder, unspecified: Secondary | ICD-10-CM

## 2013-03-02 DIAGNOSIS — I350 Nonrheumatic aortic (valve) stenosis: Secondary | ICD-10-CM

## 2013-03-02 DIAGNOSIS — I739 Peripheral vascular disease, unspecified: Secondary | ICD-10-CM

## 2013-03-02 DIAGNOSIS — I2510 Atherosclerotic heart disease of native coronary artery without angina pectoris: Secondary | ICD-10-CM

## 2013-03-02 MED ORDER — CLOPIDOGREL BISULFATE 75 MG PO TABS: 75.0000 mg | ORAL_TABLET | Freq: Every day | ORAL | Status: DC

## 2013-03-02 NOTE — Progress Notes (Signed)
Primary cardiologist: Dr. Jens Som  HPI: [name] is a pleasant 72 year old African American male who was referred by Dr. Jens Som for evaluation and management of peripheral arterial disease. He has known history of coronary artery disease. Last cardiac catheterization was performed on April 18, 2008. At that time, he was found to have normal left main. The LAD had a 40-50% lesion after the second diagonal, but the stent was widely patent. In the second diagonal, there was a 60-70% lesion, which did not appear to be flow limiting. There was a 30% circumflex. The right coronary artery was occluded. The distal right filled via left-to-right collaterals. The ejection fraction was 50%. Medical therapy was recommended. Other chronic medical conditions include diabetes, hypertension, hyperlipidemia and obesity. There is no history of tobacco use. He has been having progressive bilateral calf discomfort with walking. This started about 2 years ago and forced him to stop exercising on a treadmill. It became noticeable over the last year and has been getting worse. Currently the discomfort starts after walking about a half a block to one block. He has significant difficulty in going up one flight of stairs. Usually he has to rest for a few minutes before he can resume again he cannot walk more than 5 minutes at a time before he has to stop. The discomfort is equal in both calves. He also describes bilateral feet discomfort mostly at rest which improved after the addition of gabapentin. He has problems with balance as well.  Current Outpatient Prescriptions  Medication Sig Dispense Refill  . amLODipine (NORVASC) 10 MG tablet TAKE 1 TABLET DAILY  90 tablet  0  . aspirin 81 MG tablet Take 81 mg by mouth daily.        . insulin aspart (NOVOLOG) 100 UNIT/ML injection Inject into the skin as directed.       . isosorbide mononitrate (IMDUR) 30 MG 24 hr tablet TAKE 1 TABLET DAILY  90 tablet  0  . losartan  (COZAAR) 50 MG tablet Take 50 mg by mouth daily.      . metformin (FORTAMET) 1000 MG (OSM) 24 hr tablet Take 1,000 mg by mouth 2 (two) times daily.        . metoprolol (TOPROL-XL) 100 MG 24 hr tablet Take 100 mg by mouth 2 (two) times daily.        . nitroGLYCERIN (NITROSTAT) 0.4 MG SL tablet Place 0.4 mg under the tongue every 5 (five) minutes as needed.        . ramipril (ALTACE) 10 MG capsule Take 10 mg by mouth daily.      . rosuvastatin (CRESTOR) 20 MG tablet Take 20 mg by mouth daily.        . saxagliptin HCl (ONGLYZA) 5 MG TABS tablet Take 5 mg by mouth daily.         No current facility-administered medications for this visit.     Past Medical History  Diagnosis Date  . CAD (coronary artery disease)     a. s/p IMI in past tx with POBA and cath 6 mos later with occluded RCA;  b. h/o Taxus DES to LAD and CFX;  c.  cath 1/10: LM ok, mLAD 40-50%, LAD stent ok, D2 tandem 60-70%, pCFX 30%, mAVCFX stent ok, pRCA 40%, RV 90%, RV marginal 90%, dRCA filled L-R collats tx medically  . Carotid stenosis     dopplers 5/11: 40-59% bilat  . DM2 (diabetes mellitus, type 2)   . HTN (hypertension)   .  HLD (hyperlipidemia)   . Diabetes mellitus     Past Surgical History  Procedure Laterality Date  . Cardiac catheterization      History   Social History  . Marital Status: Married    Spouse Name: N/A    Number of Children: N/A  . Years of Education: N/A   Occupational History  . Not on file.   Social History Main Topics  . Smoking status: Former Games developer  . Smokeless tobacco: Not on file  . Alcohol Use: No  . Drug Use: No  . Sexual Activity: Not on file   Other Topics Concern  . Not on file   Social History Narrative  . No narrative on file    ROS: no fevers or chills, productive cough, hemoptysis, dysphasia, odynophagia, melena, hematochezia, dysuria, hematuria, rash, seizure activity, orthopnea, PND, pedal edema, claudication. Remaining systems are negative.  Physical  exam: Constitutional: He is oriented to person, place, and time. He appears well-developed and well-nourished. No distress.  HENT: No nasal discharge.  Head: Normocephalic and atraumatic.  Eyes: Pupils are equal and round.  No discharge. Neck: Normal range of motion. Neck supple. No JVD present. No thyromegaly present.  Cardiovascular: Normal rate, regular rhythm, normal heart sounds. Exam reveals no gallop and no friction rub. There is a 2/6 systolic ejection murmur in the aortic area  Pulmonary/Chest: Effort normal and breath sounds normal. No stridor. No respiratory distress. He has no wheezes. He has no rales. He exhibits no tenderness.  Abdominal: Soft. Bowel sounds are normal. He exhibits no distension. There is no tenderness. There is no rebound and no guarding.  Musculoskeletal: Normal range of motion. He exhibits no edema and no tenderness.  Neurological: He is alert and oriented to person, place, and time. Coordination normal.  Skin: Skin is warm and dry. No rash noted. He is not diaphoretic. No erythema. No pallor.  Psychiatric: He has a normal mood and affect. His behavior is normal. Judgment and thought content normal.  Vascular: Femoral pulses are normal bilaterally. Radial pulses are normal. Dorsalis pedis and posterior tibial: Not palpable bilaterally. 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

## 2013-03-02 NOTE — Assessment & Plan Note (Signed)
This was mild by most recent echocardiogram.

## 2013-03-02 NOTE — Patient Instructions (Signed)
Your physician has recommended you make the following change in your medication: START Plavix 75mg  take one by mouth daily  Your physician has requested that you have a peripheral vascular angiogram. This exam is performed at the hospital. During this exam IV contrast is used to look at arterial blood flow. Please review the information sheet given for details.  Your physician recommends that you return for lab work on: 04/02/2013 (BMP, CBC and PT/INR)

## 2013-03-02 NOTE — Assessment & Plan Note (Signed)
The patient has severe lifestyle limiting claudication affecting both calves. He did noninvasive evaluation done at [facility] which showed normal ABI on the right side and moderately reduced ABI on the left side at 0.67. By duplex, there was significant bilateral SFA disease. I had a prolonged discussion with him about natural history and management of peripheral arterial disease. I suggested attempting a walking program. However, he has been extremely limited and not able to walk reasonable distances before he gets discomfort. Due to these limitations, I recommended proceeding with abdominal aortogram, lower extremity runoff and possible angioplasty. Risks and benefits were discussed. I recommend starting treatment with Plavix 75 mg once daily.

## 2013-03-02 NOTE — Assessment & Plan Note (Signed)
He reports no symptoms of angina. Continue medical therapy. 

## 2013-03-12 ENCOUNTER — Other Ambulatory Visit: Payer: Self-pay | Admitting: *Deleted

## 2013-03-12 DIAGNOSIS — I739 Peripheral vascular disease, unspecified: Secondary | ICD-10-CM

## 2013-03-12 MED ORDER — CLOPIDOGREL BISULFATE 75 MG PO TABS: 75.0000 mg | ORAL_TABLET | Freq: Every day | ORAL | Status: DC

## 2013-04-02 ENCOUNTER — Encounter (HOSPITAL_COMMUNITY): Payer: Self-pay | Admitting: Pharmacy Technician

## 2013-04-02 ENCOUNTER — Other Ambulatory Visit (INDEPENDENT_AMBULATORY_CARE_PROVIDER_SITE_OTHER): Payer: Medicare HMO

## 2013-04-02 DIAGNOSIS — I2510 Atherosclerotic heart disease of native coronary artery without angina pectoris: Secondary | ICD-10-CM

## 2013-04-02 DIAGNOSIS — I359 Nonrheumatic aortic valve disorder, unspecified: Secondary | ICD-10-CM

## 2013-04-02 DIAGNOSIS — I739 Peripheral vascular disease, unspecified: Secondary | ICD-10-CM

## 2013-04-02 LAB — CBC WITH DIFFERENTIAL/PLATELET
Basophils Absolute: 0 10*3/uL (ref 0.0–0.1)
Basophils Relative: 0.3 % (ref 0.0–3.0)
Eosinophils Absolute: 0.3 10*3/uL (ref 0.0–0.7)
Eosinophils Relative: 4.6 % (ref 0.0–5.0)
HCT: 40.1 % (ref 39.0–52.0)
Hemoglobin: 13.3 g/dL (ref 13.0–17.0)
Lymphocytes Relative: 26 % (ref 12.0–46.0)
Lymphs Abs: 1.7 10*3/uL (ref 0.7–4.0)
MCHC: 33.3 g/dL (ref 30.0–36.0)
MCV: 84.3 fl (ref 78.0–100.0)
Monocytes Absolute: 0.6 10*3/uL (ref 0.1–1.0)
Monocytes Relative: 9.4 % (ref 3.0–12.0)
Neutro Abs: 4 10*3/uL (ref 1.4–7.7)
Neutrophils Relative %: 59.7 % (ref 43.0–77.0)
Platelets: 217 10*3/uL (ref 150.0–400.0)
RBC: 4.76 Mil/uL (ref 4.22–5.81)
RDW: 15.2 % — ABNORMAL HIGH (ref 11.5–14.6)
WBC: 6.7 10*3/uL (ref 4.5–10.5)

## 2013-04-02 LAB — PROTIME-INR
INR: 1.1 ratio — ABNORMAL HIGH (ref 0.8–1.0)
Prothrombin Time: 12.1 s (ref 10.2–12.4)

## 2013-04-02 LAB — BASIC METABOLIC PANEL
BUN: 15 mg/dL (ref 6–23)
CO2: 29 mEq/L (ref 19–32)
Calcium: 9.1 mg/dL (ref 8.4–10.5)
Chloride: 100 mEq/L (ref 96–112)
Creatinine, Ser: 1.3 mg/dL (ref 0.4–1.5)
GFR: 69.12 mL/min (ref >60.00)
Glucose, Bld: 202 mg/dL — ABNORMAL HIGH (ref 70–99)
Potassium: 3.7 mEq/L (ref 3.5–5.1)
Sodium: 137 mEq/L (ref 135–145)

## 2013-04-07 ENCOUNTER — Ambulatory Visit (HOSPITAL_COMMUNITY)
Admission: RE | Admit: 2013-04-07 | Discharge: 2013-04-07 | Disposition: A | Discharge status: Home / Self Care | Payer: Medicare HMO | Source: Ambulatory Visit | Attending: Cardiovascular Disease | Admitting: Cardiovascular Disease

## 2013-04-07 ENCOUNTER — Other Ambulatory Visit: Payer: Self-pay | Admitting: Cardiovascular Disease

## 2013-04-07 ENCOUNTER — Encounter (HOSPITAL_COMMUNITY)
Admission: RE | Disposition: A | Discharge status: Home / Self Care | Payer: Self-pay | Source: Ambulatory Visit | Attending: Cardiovascular Disease

## 2013-04-07 DIAGNOSIS — Z794 Long term (current) use of insulin: Secondary | ICD-10-CM | POA: Insufficient documentation

## 2013-04-07 DIAGNOSIS — E119 Type 2 diabetes mellitus without complications: Secondary | ICD-10-CM | POA: Insufficient documentation

## 2013-04-07 DIAGNOSIS — I70219 Atherosclerosis of native arteries of extremities with intermittent claudication, unspecified extremity: Secondary | ICD-10-CM | POA: Insufficient documentation

## 2013-04-07 DIAGNOSIS — E669 Obesity, unspecified: Secondary | ICD-10-CM | POA: Insufficient documentation

## 2013-04-07 DIAGNOSIS — I2510 Atherosclerotic heart disease of native coronary artery without angina pectoris: Secondary | ICD-10-CM | POA: Insufficient documentation

## 2013-04-07 DIAGNOSIS — I708 Atherosclerosis of other arteries: Secondary | ICD-10-CM | POA: Insufficient documentation

## 2013-04-07 DIAGNOSIS — I10 Essential (primary) hypertension: Secondary | ICD-10-CM | POA: Insufficient documentation

## 2013-04-07 DIAGNOSIS — I739 Peripheral vascular disease, unspecified: Secondary | ICD-10-CM

## 2013-04-07 DIAGNOSIS — E785 Hyperlipidemia, unspecified: Secondary | ICD-10-CM | POA: Insufficient documentation

## 2013-04-07 HISTORY — PX: ABDOMINAL AORTAGRAM: SHX5454

## 2013-04-07 LAB — CBC
HCT: 40.5 % (ref 39.0–52.0)
Hemoglobin: 14 g/dL (ref 13.0–17.0)
MCH: 28.7 pg (ref 26.0–34.0)
MCHC: 34.6 g/dL (ref 30.0–36.0)
MCV: 83 fL (ref 78.0–100.0)
Platelets: 190 10*3/uL (ref 150–400)
RBC: 4.88 MIL/uL (ref 4.22–5.81)
RDW: 14.5 % (ref 11.5–15.5)
WBC: 6.6 10*3/uL (ref 4.0–10.5)

## 2013-04-07 LAB — GLUCOSE, CAPILLARY
Glucose-Capillary: 190 mg/dL — ABNORMAL HIGH (ref 70–99)
Glucose-Capillary: 168 mg/dL — ABNORMAL HIGH (ref 70–99)
Glucose-Capillary: 184 mg/dL — ABNORMAL HIGH (ref 70–99)

## 2013-04-07 LAB — PROTIME-INR
INR: 1.01 (ref 0.00–1.49)
Prothrombin Time: 13.1 seconds (ref 11.6–15.2)

## 2013-04-07 SURGERY — ABDOMINAL AORTAGRAM
Anesthesia: LOCAL | Laterality: Bilateral

## 2013-04-07 MED ORDER — MIDAZOLAM HCL 2 MG/2ML IJ SOLN
INTRAMUSCULAR | Status: AC
  Filled 2013-04-07: qty 2

## 2013-04-07 MED ORDER — SODIUM CHLORIDE 0.9 % IV SOLN
INTRAVENOUS | Status: DC
  Administered 2013-04-07: 08:00:00 via INTRAVENOUS

## 2013-04-07 MED ORDER — CILOSTAZOL 50 MG PO TABS: 50.0000 mg | ORAL_TABLET | Freq: Two times a day (BID) | ORAL | Status: DC

## 2013-04-07 MED ORDER — ASPIRIN 81 MG PO CHEW: 81.0000 mg | CHEWABLE_TABLET | ORAL | Status: DC

## 2013-04-07 MED ORDER — SODIUM CHLORIDE 0.9 % IJ SOLN: 3.0000 mL | Freq: Two times a day (BID) | INTRAMUSCULAR | Status: DC

## 2013-04-07 MED ORDER — SODIUM CHLORIDE 0.9 % IV SOLN: 250.0000 mL | INTRAVENOUS | Status: DC | PRN

## 2013-04-07 MED ORDER — SODIUM CHLORIDE 0.9 % IJ SOLN: 3.0000 mL | INTRAMUSCULAR | Status: DC | PRN

## 2013-04-07 MED ORDER — SODIUM CHLORIDE 0.9 % IV SOLN: INTRAVENOUS | Status: DC

## 2013-04-07 MED ORDER — LIDOCAINE HCL (PF) 1 % IJ SOLN
INTRAMUSCULAR | Status: AC
  Filled 2013-04-07: qty 30

## 2013-04-07 MED ORDER — FENTANYL CITRATE 0.05 MG/ML IJ SOLN
INTRAMUSCULAR | Status: AC
  Filled 2013-04-07: qty 2

## 2013-04-07 MED ORDER — HEPARIN (PORCINE) IN NACL 2-0.9 UNIT/ML-% IJ SOLN
INTRAMUSCULAR | Status: AC
  Filled 2013-04-07: qty 1000

## 2013-04-07 MED ORDER — ACETAMINOPHEN 325 MG PO TABS: 650.0000 mg | ORAL_TABLET | ORAL | Status: DC | PRN

## 2013-04-07 NOTE — Discharge Instructions (Signed)
Stop taking Plavix and start Pletal 50 mg twice daily.  Groin Site Care Refer to this sheet in the next few weeks. These instructions provide you with information on caring for yourself after your procedure. Your caregiver may also give you more specific instructions. Your treatment has been planned according to current medical practices, but problems sometimes occur. Call your caregiver if you have any problems or questions after your procedure. HOME CARE INSTRUCTIONS  You may shower 24 hours after the procedure. Remove the bandage (dressing) and gently wash the site with plain soap and water. Gently pat the site dry.  Do not apply powder or lotion to the site.  Do not sit in a bathtub, swimming pool, or whirlpool for 5 to 7 days.  No bending, squatting, or lifting anything over 10 pounds (4.5 kg) as directed by your caregiver.  Inspect the site at least twice daily.  Do not drive home if you are discharged the same day of the procedure. Have someone else drive you.  You may drive 24 hours after the procedure unless otherwise instructed by your caregiver. What to expect:  Any bruising will usually fade within 1 to 2 weeks.  Blood that collects in the tissue (hematoma) may be painful to the touch. It should usually decrease in size and tenderness within 1 to 2 weeks. SEEK IMMEDIATE MEDICAL CARE IF:  You have unusual pain at the groin site or down the affected leg.  You have redness, warmth, swelling, or pain at the groin site.  You have drainage (other than a small amount of blood on the dressing).  You have chills.  You have a fever or persistent symptoms for more than 72 hours.  You have a fever and your symptoms suddenly get worse.  Your leg becomes pale, cool, tingly, or numb.  You have heavy bleeding from the site. Hold pressure on the site. Document Released: 04/20/2010 Document Revised: 06/10/2011 Document Reviewed: 04/20/2010 [facility] Patient Information 2014  [place], [place].

## 2013-04-07 NOTE — Research (Signed)
Endomax Informed Consent   Subject Name: Janari Yamada  Subject met inclusion and exclusion criteria.  The informed consent form, study requirements and expectations were reviewed with the subject and questions and concerns were addressed prior to the signing of the consent form.  The subject verbalized understanding of the trail requirements.  The subject agreed to participate in the Endomax trial and signed the informed consent.  The informed consent was obtained prior to performance of any protocol-specific procedures for the subject.  A copy of the signed informed consent was given to the subject and a copy was placed in the subject's medical record.  Sandie Ano 04/07/2013, 7:15

## 2013-04-07 NOTE — CV Procedure (Signed)
PERIPHERAL VASCULAR PROCEDURE  NAME:  Jordan [name]   MRN: [id number] DOB:  [birth year]   ADMIT DATE: 04/07/2013  Performing Cardiologist: Lorine Bears Primary Physician: [name], MD Primary Cardiologist:  Dr. Jens Som  Procedures Performed:  Abdominal Aortic Angiogram with Bi-Iliofemoral Runoff  Selective left lower extremity arterial angiography  Selective right lower extremity arterial angiography   Indication(s):   Claudication   Consent: The procedure with Risks/Benefits/Alternatives and Indications was reviewed with the patient .  All questions were answered.  Medications:  Sedation:  1 mg IV Versed, 50 mcg IV Fentanyl  Contrast:  107 mL  Visipaque   Procedural details: The right groin was prepped, draped, and anesthetized with 1% lidocaine. Using modified Seldinger technique, a 5 French sheath was introduced into the right common femoral artery. A 5 Fr Short Pigtail Catheter was advanced of over a  Versicore wire into the descending Aorta to a level just above the renal arteries. A power injection of 74ml/sec contrast over 1 sec was performed for Abdominal Aortic Angiography.  The catheter was then pulled back to a level just above the Aortic bifurcation, and a second power injection was performed to evaluate the iliac arteries.   The pigtail catheter was changed over the Versicore wire for A crossover catheter which was then pulled back the aortic bifurcation and the wire was advanced down the contralateral common iliac artery.  The wire was then advanced to the contralateral common femoral artery, the catheter was exchanged into an end hole straight tip catheter which was advanced over the wire to the common femoral artery. Contralateral second-order lower extremity angiography was performed via power injection of 5 ml / sec contrast for a total of 35 ml. A pressure pullback was recorded across the left common iliac artery stenosis  The catheter was then removed.  At this time the injector was directed to the sheath SideArm and ipsilateral artery angiography was performed via power injection of 5  ml/sec contrast for a total of 35 ml.     The patient tolerated the procedure well with no immediate complications.    Hemodynamics:  Central Aortic Pressure / Mean Aortic Pressure: 159/82  Findings:  Abdominal aorta: Normal in size with no significant disease. No evidence of aneurysm.  Left renal artery: 20% ostial stenosis.  Right renal artery: Normal  Celiac artery: Patent  Superior mesenteric artery: Patent  Right common iliac artery: There is a small ulcerated plaque proximally. Distally there is a 20% stenosis at the origin of the internal iliac artery  Right internal iliac artery: Patent with mild ostial stenosis.  Right external iliac artery: Normal  Right common femoral artery: 20% disease distally  Right profunda femoral artery: 50% disease proximally with diffuse 50% disease in the midsegment.  Right superficial femoral artery: Normal in size with 50% diffuse mid stenosis.  Right popliteal artery: 40% mid stenosis  Right tibial peroneal trunk: 60% proximal stenosis  Right anterior tibial artery: Occluded proximally  Right peroneal artery: Patent with 50% proximal stenosis and 60-70% distal stenosis. This is the only patent vessel to the foot and provides collaterals to the arch.  Right posterior tibial artery: Occluded proximally  Left common iliac artery:  There is 50% distal stenosis at the origin of the internal iliac artery. The peak systolic gradient was less than 10 mm mercury across the stenosis.  Left internal iliac artery: There is heavily calcified lesion of at the ostium causing 70% stenosis.  Left external iliac artery: Normal  Left common femoral artery: 20% proximal stenosis.  Left profunda femoral artery: Patent with diffuse 30% disease proximally. This provides extensive collaterals reconstitute the distal  SFA  Left superficial femoral artery:  Occluded proximally a few centimeters after its origin with reconstitution distally via collaterals from the profunda  Left popliteal artery: Diffuse 20-30% disease.  Left tibial peroneal trunk: 80% proximal disease and then the vessel is occluded.  Left anterior tibial artery: A detailed 90% ostial stenosis at its origin with diffuse 60% disease proximally. The vessel has no significant disease distally and is the only patent vessel to the foot.   Conclusions: 1. Moderate left common iliac artery stenosis with no significant pressure gradient. 2. Long occlusion of the left SFA with reconstitution via collaterals from the profunda distally. One-vessel runoff below the knee on the left side via the anterior tibial artery which has an 80-90% ostial stenosis. 3. Moderate right SFA disease with one-vessel runoff below the knee on the right side with only patent peroneal artery.  Recommendations:  I recommend attempted medical therapy for now given that there are no good revascularization options. High-risk intervention can be considered for worsening symptoms or critical limb ischemia.   Lorine BearsMuhammad Arida, MD, [facility] - BentonvilleFACC 04/07/2013 9:33 AM

## 2013-04-07 NOTE — H&P (Signed)
History and Physical  Patient ID: Jordan [name] MRN: [id number] DOB/AGE: 1940/07/17 73 y.o. Admit date: 04/07/2013  Primary Care Physician: Lindwood QuaHOFFMAN,BYRON, MD Primary Cardiologist Dr. Jens Somrenshaw  HPI: Jordan [name] is a pleasant 73 year old African American male who was referred by Dr. Jens Somrenshaw for evaluation and management of peripheral arterial disease. He has known history of coronary artery disease. Last cardiac catheterization was performed on April 18, 2008. At that time, he was found to have normal left main. The LAD had a 40-50% lesion after the second diagonal, but the stent was widely patent. In the second diagonal, there was a 60-70% lesion, which did not appear to be flow limiting. There was a 30% circumflex. The right coronary artery was occluded. The distal right filled via left-to-right collaterals. The ejection fraction was 50%. Medical therapy was recommended. Other chronic medical conditions include diabetes, hypertension, hyperlipidemia and obesity. There is no history of tobacco use.  He has been having progressive bilateral calf discomfort with walking. This started about 2 years ago and forced him to stop exercising on a treadmill. It became noticeable over the last year and has been getting worse. Currently the discomfort starts after walking about a half a block to one block. He has significant difficulty in going up one flight of stairs. Usually he has to rest for a few minutes before he can resume again he cannot walk more than 5 minutes at a time before he has to stop. The discomfort is equal in both calves. He also describes bilateral feet discomfort mostly at rest which improved after the addition of gabapentin. He has problems with balance as well.   Review of systems complete and found to be negative unless listed above  Past Medical History  Diagnosis Date  . CAD (coronary artery disease)     a. s/p IMI in past tx with POBA and cath 6 mos later with occluded RCA;  b.  h/o Taxus DES to LAD and CFX;  c.  cath 1/10: LM ok, mLAD 40-50%, LAD stent ok, D2 tandem 60-70%, pCFX 30%, mAVCFX stent ok, pRCA 40%, RV 90%, RV marginal 90%, dRCA filled L-R collats tx medically  . Carotid stenosis     dopplers 5/11: 40-59% bilat  . DM2 (diabetes mellitus, type 2)   . HTN (hypertension)   . HLD (hyperlipidemia)   . Diabetes mellitus     No family history on file.  History   Social History  . Marital Status: Married    Spouse Name: N/A    Number of Children: N/A  . Years of Education: N/A   Occupational History  . Not on file.   Social History Main Topics  . Smoking status: Former Games developermoker  . Smokeless tobacco: Not on file  . Alcohol Use: No  . Drug Use: No  . Sexual Activity: Not on file   Other Topics Concern  . Not on file   Social History Narrative  . No narrative on file    Past Surgical History  Procedure Laterality Date  . Cardiac catheterization       Prescriptions prior to admission  Medication Sig Dispense Refill  . amLODipine (NORVASC) 10 MG tablet Take 10 mg by mouth daily.      [name]. aspirin 81 MG tablet Take 81 mg by mouth daily.        . clopidogrel (PLAVIX) 75 MG tablet Take 75 mg by mouth daily with breakfast.      . gabapentin (NEURONTIN) 100 MG capsule Take  300 mg by mouth at bedtime.       . insulin aspart (NOVOLOG) 100 UNIT/ML injection Inject 20 Units into the skin 3 (three) times daily with meals.       . insulin glargine (LANTUS) 100 UNIT/ML injection Inject 40 Units into the skin at bedtime.      . isosorbide mononitrate (IMDUR) 30 MG 24 hr tablet Take 30 mg by mouth daily.      . Liraglutide (VICTOZA) 18 MG/3ML SOPN Inject 0.6 mLs into the skin every morning.       [name] losartan (COZAAR) 50 MG tablet Take 50 mg by mouth daily.      . metformin (FORTAMET) 1000 MG (OSM) 24 hr tablet Take 1,000 mg by mouth 2 (two) times daily.        . metoprolol (TOPROL-XL) 100 MG 24 hr tablet Take 100 mg by mouth 2 (two) times daily.        .  rosuvastatin (CRESTOR) 20 MG tablet Take 20 mg by mouth daily.        . nitroGLYCERIN (NITROSTAT) 0.4 MG SL tablet Place 0.4 mg under the tongue every 5 (five) minutes as needed.          Physical Exam: Blood pressure 169/90, pulse 68, temperature 98.2 F (36.8 C), temperature source Oral, resp. rate 16.    Labs:   Lab Results  Component Value Date   WBC 6.6 04/07/2013   HGB 14.0 04/07/2013   HCT 40.5 04/07/2013   MCV 83.0 04/07/2013   PLT 190 04/07/2013    Recent Labs Lab 04/02/13 0950  NA 137  K 3.7  CL 100  CO2 29  BUN 15  CREATININE 1.3  CALCIUM 9.1  GLUCOSE 202*   Lab Results  Component Value Date   CKTOTAL 148 04/17/2008   CKMB 4.3* 04/17/2008   TROPONINI  Value: 0.01        NO INDICATION OF MYOCARDIAL INJURY. 04/17/2008    Lab Results  Component Value Date   CHOL  Value: 171        ATP III CLASSIFICATION:  <200     mg/dL   Desirable  161-096  mg/dL   Borderline High  >=045    mg/dL   High        07/08/8117   Lab Results  Component Value Date   HDL 24* 04/17/2008   Lab Results  Component Value Date   LDLCALC  Value: 111        Total Cholesterol/HDL:CHD Risk Coronary Heart Disease Risk Table                     Men   Women  1/2 Average Risk   3.4   3.3  Average Risk       5.0   4.4  2 X Average Risk   9.6   7.1  3 X Average Risk  23.4   11.0        Use the calculated Patient Ratio above and the CHD Risk Table to determine the patient's CHD Risk.        ATP III CLASSIFICATION (LDL):  <100     mg/dL   Optimal  147-829  mg/dL   Near or Above                    Optimal  130-159  mg/dL   Borderline  562-130  mg/dL   High  >865     mg/dL  Very High* 04/17/2008   Lab Results  Component Value Date   TRIG 181* 04/17/2008   Lab Results  Component Value Date   CHOLHDL 7.1 04/17/2008   No results found for this basename: LDLDIRECT      Radiology: No results found.    ASSESSMENT AND PLAN:  PAD (peripheral artery disease)         The patient has severe lifestyle limiting  claudication affecting both calves. He did noninvasive evaluation done at [facility] which showed normal ABI on the right side and moderately reduced ABI on the left side at 0.67. By duplex, there was significant bilateral SFA disease.  I had a prolonged discussion with him about natural history and management of peripheral arterial disease. I suggested attempting a walking program. However, he has been extremely limited and not able to walk reasonable distances before he gets discomfort. Due to these limitations, I recommended proceeding with abdominal aortogram, lower extremity runoff and possible angioplasty. Risks and benefits were discussed. He is on Plavix 75 mg once daily. He consented for the Endomax study.      Signed: Lorine Bears MD, [facility] 04/07/2013, 8:41 AM

## 2013-04-27 ENCOUNTER — Ambulatory Visit (INDEPENDENT_AMBULATORY_CARE_PROVIDER_SITE_OTHER): Payer: Medicare HMO | Admitting: Cardiovascular Disease

## 2013-04-27 ENCOUNTER — Encounter: Payer: Self-pay | Admitting: Cardiovascular Disease

## 2013-04-27 VITALS — BP 124/57 | HR 69 | Ht 69.0 in | Wt 227.4 lb

## 2013-04-27 DIAGNOSIS — I739 Peripheral vascular disease, unspecified: Secondary | ICD-10-CM

## 2013-04-27 MED ORDER — CILOSTAZOL 100 MG PO TABS: 100.0000 mg | ORAL_TABLET | Freq: Two times a day (BID) | ORAL | Status: DC

## 2013-04-27 NOTE — Progress Notes (Signed)
Primary cardiologist: Dr. Jens Som  HPI: Jordan [name] is a pleasant 73 year old African American male who is here today for a followup visit regarding  peripheral arterial disease. He has known history of coronary artery disease. Last cardiac catheterization was performed on April 18, 2008. At that time, he was found to have normal left main. The LAD had a 40-50% lesion after the second diagonal, but the stent was widely patent. In the second diagonal, there was a 60-70% lesion, which did not appear to be flow limiting. There was a 30% circumflex. The right coronary artery was occluded. The distal right filled via left-to-right collaterals. The ejection fraction was 50%. Medical therapy was recommended. Other chronic medical conditions include diabetes, hypertension, hyperlipidemia and obesity. There is no history of tobacco use. He was seen recently for bilateral calf discomfort with walking. This started about 2 years ago and forced him to stop exercising on a treadmill. It became noticeable over the last year and has been getting worse. Currently the discomfort starts after walking about a half a block to one block. He has significant difficulty in going up one flight of stairs. Usually he has to rest for a few minutes before he can resume again he cannot walk more than 5 minutes at a time before he has to stop. The discomfort is equal in both calves. He also describes bilateral feet discomfort mostly at rest which improved after the addition of gabapentin. He has problems with balance as well. Noninvasive studies done at [facility] showed a normal right ABI. ABI on the left side was 0.67. I proceeded with angiography which showed: 1. Moderate left common iliac artery stenosis with no significant pressure gradient.  2. Long occlusion of the left SFA with reconstitution via collaterals from the profunda distally. One-vessel runoff below the knee on the left side via the anterior tibial artery which has an  80-90% ostial stenosis.  3. Moderate right SFA disease with one-vessel runoff below the knee on the right side with only patent peroneal artery.   Current Outpatient Prescriptions  Medication Sig Dispense Refill  . amLODipine (NORVASC) 10 MG tablet Take 10 mg by mouth daily.      [name] aspirin 81 MG tablet Take 81 mg by mouth daily.        . cilostazol (PLETAL) 50 MG tablet Take 1 tablet (50 mg total) by mouth 2 (two) times daily.  60 tablet  0  . gabapentin (NEURONTIN) 100 MG capsule Take 300 mg by mouth at bedtime.       . insulin aspart (NOVOLOG) 100 UNIT/ML injection Inject 20 Units into the skin 3 (three) times daily with meals.       . insulin glargine (LANTUS) 100 UNIT/ML injection Inject 40 Units into the skin at bedtime.      . isosorbide mononitrate (IMDUR) 30 MG 24 hr tablet Take 30 mg by mouth daily.      . Liraglutide (VICTOZA) 18 MG/3ML SOPN Inject 0.6 mLs into the skin every morning.       [name] losartan (COZAAR) 50 MG tablet Take 50 mg by mouth daily.      . metformin (FORTAMET) 1000 MG (OSM) 24 hr tablet Take 1,000 mg by mouth 2 (two) times daily.        . metoprolol (TOPROL-XL) 100 MG 24 hr tablet Take 100 mg by mouth 2 (two) times daily.        . nitroGLYCERIN (NITROSTAT) 0.4 MG SL tablet Place 0.4 mg under  the tongue every 5 (five) minutes as needed.        . rosuvastatin (CRESTOR) 20 MG tablet Take 20 mg by mouth daily.        . clopidogrel (PLAVIX) 75 MG tablet 75 mg daily.       No current facility-administered medications for this visit.     Past Medical History  Diagnosis Date  . CAD (coronary artery disease)     a. s/p IMI in past tx with POBA and cath 6 mos later with occluded RCA;  b. h/o Taxus DES to LAD and CFX;  c.  cath 1/10: LM ok, mLAD 40-50%, LAD stent ok, D2 tandem 60-70%, pCFX 30%, mAVCFX stent ok, pRCA 40%, RV 90%, RV marginal 90%, dRCA filled L-R collats tx medically  . Carotid stenosis     dopplers 5/11: 40-59% bilat  . DM2 (diabetes mellitus, type 2)     . HTN (hypertension)   . HLD (hyperlipidemia)   . Diabetes mellitus     Past Surgical History  Procedure Laterality Date  . Cardiac catheterization      History   Social History  . Marital Status: Married    Spouse Name: N/A    Number of Children: N/A  . Years of Education: N/A   Occupational History  . Not on file.   Social History Main Topics  . Smoking status: Former Games developermoker  . Smokeless tobacco: Not on file  . Alcohol Use: No  . Drug Use: No  . Sexual Activity: Not on file   Other Topics Concern  . Not on file   Social History Narrative  . No narrative on file    ROS: no fevers or chills, productive cough, hemoptysis, dysphasia, odynophagia, melena, hematochezia, dysuria, hematuria, rash, seizure activity, orthopnea, PND, pedal edema, claudication. Remaining systems are negative.  Physical exam: Constitutional: He is oriented to person, place, and time. He appears well-developed and well-nourished. No distress.  HENT: No nasal discharge.  Head: Normocephalic and atraumatic.  Eyes: Pupils are equal and round.  No discharge. Neck: Normal range of motion. Neck supple. No JVD present. No thyromegaly present.  Cardiovascular: Normal rate, regular rhythm, normal heart sounds. Exam reveals no gallop and no friction rub. There is a 2/6 systolic ejection murmur in the aortic area  Pulmonary/Chest: Effort normal and breath sounds normal. No stridor. No respiratory distress. He has no wheezes. He has no rales. He exhibits no tenderness.  Abdominal: Soft. Bowel sounds are normal. He exhibits no distension. There is no tenderness. There is no rebound and no guarding.  Musculoskeletal: Normal range of motion. He exhibits no edema and no tenderness.  Neurological: He is alert and oriented to person, place, and time. Coordination normal.  Skin: Skin is warm and dry. No rash noted. He is not diaphoretic. No erythema. No pallor.  Psychiatric: He has a normal mood and affect. His  behavior is normal. Judgment and thought content normal.  Vascular: Femoral pulses are normal bilaterally. Radial pulses are normal. Dorsalis pedis and posterior tibial: Not palpable bilaterally. 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 No groin hematoma.       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

## 2013-04-27 NOTE — Assessment & Plan Note (Signed)
The patient has severe lifestyle limiting claudication affecting both calves. He did noninvasive evaluation done at [facility] which showed normal ABI on the right side and moderately reduced ABI on the left side at 0.67.  Angiography showed occluded left SFA, moderate left external iliac artery stenosis with no significant pressure gradient and significant below the knee disease bilaterally with one-vessel runoff on both sides. No good revascularization options especially that he is a claudication with no critical limb ischemia. I started him on Pletal 50 mg twice daily which has been well-tolerated. Plavix was discontinued. Continue aspirin. I instructed him to start a walking exercise program. Continue treatment of risk factors uncontrolled diabetes. Followup in 4 months.

## 2013-04-27 NOTE — Patient Instructions (Signed)
Your physician wants you to follow-up in: 4 months with Dr. Kirke CorinArida.  You will receive a reminder letter in the mail two months in advance. If you don't receive a letter, please call our office to schedule the follow-up appointment.  Your physician has recommended you make the following change in your medication: INCREASE PLETAL TO 100MG  TWICE A DAY  Continue with all other medications as listed.

## 2013-05-13 ENCOUNTER — Telehealth: Payer: Self-pay | Admitting: Cardiovascular Disease

## 2013-05-13 NOTE — Telephone Encounter (Signed)
Left message to call back  

## 2013-05-13 NOTE — Telephone Encounter (Signed)
Called patient back. He states that since the dose of Pletal 50mg  BID was increased to 100mg  BID his feet have been cold to touch and painfull. Advised will send message to Dr.Arida and call him back.

## 2013-05-13 NOTE — Telephone Encounter (Signed)
Decrease the dose back to 50 mg bid and update us in few days if symptoms don't improve.

## 2013-05-13 NOTE — Telephone Encounter (Signed)
Patient aware to lower dose of Pletal to 50mg  BID and call back if symptoms do not resolve per Dr. Kirke CorinArida.

## 2013-05-13 NOTE — Telephone Encounter (Signed)
New message    Patient calling C/O side effect from medication cilostazol 100 mg H/O of dm causing foot pain at night.

## 2014-01-10 ENCOUNTER — Ambulatory Visit (INDEPENDENT_AMBULATORY_CARE_PROVIDER_SITE_OTHER): Payer: Medicare PPO

## 2014-01-10 VITALS — BP 161/89 | HR 64 | Resp 12

## 2014-01-10 DIAGNOSIS — L97411 Non-pressure chronic ulcer of right heel and midfoot limited to breakdown of skin: Secondary | ICD-10-CM

## 2014-01-10 DIAGNOSIS — L97521 Non-pressure chronic ulcer of other part of left foot limited to breakdown of skin: Secondary | ICD-10-CM

## 2014-01-10 DIAGNOSIS — E1140 Type 2 diabetes mellitus with diabetic neuropathy, unspecified: Secondary | ICD-10-CM

## 2014-01-10 DIAGNOSIS — I739 Peripheral vascular disease, unspecified: Secondary | ICD-10-CM

## 2014-01-10 MED ORDER — SILVER SULFADIAZINE 1 % EX CREA: 1.0000 "application " | TOPICAL_CREAM | Freq: Every day | CUTANEOUS | Status: DC

## 2014-01-10 MED ORDER — CEPHALEXIN 500 MG PO CAPS: 500.0000 mg | ORAL_CAPSULE | Freq: Three times a day (TID) | ORAL | Status: DC

## 2014-01-10 NOTE — Patient Instructions (Signed)
ANTIBACTERIAL SOAP INSTRUCTIONS  THE DAY AFTER PROCEDURE  Please follow the instructions your doctor has marked.   Shower as usual. Before getting out, place a drop of antibacterial liquid soap (Dial) on a wet, clean washcloth.  Gently wipe washcloth over affected area.  Afterward, rinse the area with warm water.  Blot the area dry with a soft cloth and cover with antibiotic ointment (neosporin, polysporin, bacitracin) and band aid or gauze and tape  Place 3-4 drops of antibacterial liquid soap in a quart of warm tap water.  Submerge foot into water for 20 minutes.  If bandage was applied after your procedure, leave on to allow for easy lift off, then remove and continue with soak for the remaining time.  Next, blot area dry with a soft cloth and cover with a bandage.  Apply other medications as directed by your doctor, such as cortisporin otic solution (eardrops) or neosporin antibiotic ointment  After soaking her wash it with soap and water daily apply Silvadene cream and gauze dressing. Repeat daily cleansing and dressing changes until ulcers have resolved

## 2014-01-10 NOTE — Progress Notes (Signed)
   Subjective:    Patient ID: Jordan [name], male    DOB: 12-Jul-1940, 73 y.o.   MRN: [id number]  HPI PT STATED LT BALL OF THE FOOT HAVE AN OPEN SORE AND HAVE DRINIGE FOR 1 WEEK. FOOT IS BEEN THE SAME NOT GETTING WORSE. THE FOOT GET AGGRAVATED BY PRESSURE. TRIED TO USED ANTIBIOTIC OINTMENT IT HELP SOME.   Review of Systems  Constitutional: Positive for appetite change.  Musculoskeletal: Positive for gait problem, joint swelling and myalgias.  All other systems reviewed and are negative.      Objective:   Physical Exam 73 year old [place] presents this time for initial visit been years since he was seen complaining of sores on his left foot which area the fifth metatarsal however has ulcers sub-fourth and sub-fifth and lateral fifth metatarsal base on the left foot and after treatment also pointed out to the lateral right heel where has a denuded skin ulcer site of about a centimeter by 3 cm posterior lateral right heel. Vascular status is grossly diminished on [facility] testing has absent sensation to the forefoot toes and digits pedal pulses not palpable DP or PT history of vascular studies indicating bilateral blockages for which a stenting was proposed back earlier this year has been seen by vascular clinic [facility]. Patient's most recent A1c was 9.5 has relatively poor diabetic control again grossly absent sensation on both feet with atrophy skin patient apparently use one of the mechanical buffing ages try to cut down her are removed his calluses despite reading instructions NSAID Ashley RoyaltyMatthews is a diabetic. Patient has open wounds or ulcers again 3 ulcers on the left foot once a larger ulcer on the right heel all down to dermal subdermal junction subcutaneous tissue do not go down to bone or capsule dermal subcutaneous tissue levels only. Mild erythema around the periphery the ulcers some hemorrhagic keratoses and maceration are noted as well. No significant malodor no fever  chills noted by the patient no other wounds patient does have arthropathy rigid digital contractures and deformities and HAV deformity lateral deviation of hallux and prominence of the fifth metatarsals bilateral atrophy of fat pad skin.       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 Instructions for dressing changes are given prescription for cephalexin is issued we'll make arrangements for vascular evaluation with possible stenting being option for him in the future. Recheck for wound care in one week for followup next  Alvan Dameichard Angelica Wix DPM

## 2014-01-17 ENCOUNTER — Ambulatory Visit (INDEPENDENT_AMBULATORY_CARE_PROVIDER_SITE_OTHER): Payer: Medicare PPO

## 2014-01-17 VITALS — BP 160/99 | HR 79 | Resp 12

## 2014-01-17 DIAGNOSIS — L97411 Non-pressure chronic ulcer of right heel and midfoot limited to breakdown of skin: Secondary | ICD-10-CM

## 2014-01-17 DIAGNOSIS — I739 Peripheral vascular disease, unspecified: Secondary | ICD-10-CM

## 2014-01-17 DIAGNOSIS — E1140 Type 2 diabetes mellitus with diabetic neuropathy, unspecified: Secondary | ICD-10-CM

## 2014-01-17 DIAGNOSIS — L97521 Non-pressure chronic ulcer of other part of left foot limited to breakdown of skin: Secondary | ICD-10-CM

## 2014-01-17 MED ORDER — CEPHALEXIN 500 MG PO CAPS: 500.0000 mg | ORAL_CAPSULE | Freq: Three times a day (TID) | ORAL | Status: DC

## 2014-01-17 NOTE — Progress Notes (Signed)
   Subjective:    Patient ID: Jordan [name], male    DOB: [birth year], 73 y.o.   MRN: [id number]  HPI patient presents this time for followup ulcerations of both feet need ulceration sub-fifth left in the forefoot which is improving also continues to have the macerated keratotic and fissured lesion posterior right heel.    Review of Systems no new findings or systemic changes noted patient has a long-standing history of diabetes and complications D. problems with swelling and myalgias. Patient also the past has been seen by vascular clinic and has undergone stenting in treatment however has not been reevaluated almost a year. There was recommendation the past that he may be benefit from bypass versus stenting at this time my recommendations for him to repeat contact with his vascular clinic and followup with invasive procedure if needed     Objective:   Physical Exam Lower extremities subjective findings intact and unchanged nonpalpable pedal pulses DP or PT with history of vascular disease being noted in blockage is patient is [facility] and possible invasive stenting or bypass. Both feet are examined the ulcer site some fourth and fifth MTP area of left is down to dermal subdermal junction with some pink granulation under the fourth metatarsal however there is some fibrous and necrotic tissue sub-fifth MTP area with some additional fluctuance being noted it does not probe down to bone or capsule however there is decreased temperature turgor conservative superficial skin exfoliative this time and some necrotic tissue is noted in the central portion there still some slight malodor no ascending cellulitis or lymphangitis is noted. Mild serous drainage is continued. Examination of the right heel lateral and posterior heel reveals fissuring and hemorrhage a keratoses which on debridement reveals a small pinpoint bleeds and macerated keratotic  tissue patient apparently is has an ulceration this area in the past. Up until recently he was wearing athletic shoes and now is back to wearing his diabetic shoes which have not been worn for 5 years, however appear to be in good condition       Assessment & Plan:  Assessment diabetes with history peripheral neuropathy as well as peripheral arterial disease and vascular compromise affecting the ulcerations of both lower extremities anterior lateral right forefoot proxy 1 x 3 cm ulceration of the lateral right heel is identified and a 73 year old ulceration sub-fifth and fourth MTP areas left foot are identified of the sub-5 left show some necrotic tissue all 3 on debridement at this time her treatment and dressed with Silvadene gauze dressing patient been taking cephalexin antibiotic as instructed a refill for cephalexin is issued at this time also we'll continue with Silvadene and gauze dressing changes. Patient will get vascular reassessment within the next 2 weeks reappointed 2 weeks for further followup and ulcer check maintain daily dressing changes as instructed. Patient is advised that if there is any changes or exacerbations fever chills increase in drainage or malodor patient is to go immediately to the emergency room for followup otherwise keep appointment in 2 weeks for further wound care and assessment  Alvan Dameichard Amberley Hamler DPM

## 2014-01-17 NOTE — Patient Instructions (Signed)
ANTIBACTERIAL SOAP INSTRUCTIONS  THE DAY AFTER PROCEDURE  Please follow the instructions your doctor has marked.   Shower as usual. Before getting out, place a drop of antibacterial liquid soap (Dial) on a wet, clean washcloth.  Gently wipe washcloth over affected area.  Afterward, rinse the area with warm water.  Blot the area dry with a soft cloth and cover with antibiotic ointment (neosporin, polysporin, bacitracin) and band aid or gauze and tape  Place 3-4 drops of antibacterial liquid soap in a quart of warm tap water.  Submerge foot into water for 10 minutes.  If bandage was applied after your procedure, leave on to allow for easy lift off, then remove and continue with soak for the remaining time.  Next, blot area dry with a soft cloth and cover with a bandage.  Apply other medications as directed by your doctor, such as cortisporin otic solution (eardrops) or neosporin antibiotic ointment    Following soaker washing with soap and water apply Silvadene and gauze dressing daily to the affected areas of the left forefoot and right heel area.  Also followup and make arrangements to [facility]. Contact her circulation Dr. and make arrangements for possible reevaluation arterial studies and possible stenting or bypassing may be needed

## 2014-01-24 ENCOUNTER — Other Ambulatory Visit: Payer: Self-pay | Admitting: Orthopedic Surgery

## 2014-01-24 ENCOUNTER — Inpatient Hospital Stay (HOSPITAL_COMMUNITY)
Admission: AD | Admit: 2014-01-24 | Discharge: 2014-02-07 | DRG: 854 | Disposition: A | Discharge status: Skilled Nursing Facility | Payer: Medicare PPO | Source: Other Acute Inpatient Hospital | Attending: Internal Medicine | Admitting: Internal Medicine

## 2014-01-24 ENCOUNTER — Inpatient Hospital Stay (HOSPITAL_COMMUNITY): Payer: Medicare PPO

## 2014-01-24 ENCOUNTER — Encounter (HOSPITAL_COMMUNITY): Payer: Self-pay | Admitting: Internal Medicine

## 2014-01-24 DIAGNOSIS — IMO0002 Reserved for concepts with insufficient information to code with codable children: Secondary | ICD-10-CM

## 2014-01-24 DIAGNOSIS — I739 Peripheral vascular disease, unspecified: Secondary | ICD-10-CM

## 2014-01-24 DIAGNOSIS — Z87891 Personal history of nicotine dependence: Secondary | ICD-10-CM | POA: Diagnosis not present

## 2014-01-24 DIAGNOSIS — I6523 Occlusion and stenosis of bilateral carotid arteries: Secondary | ICD-10-CM | POA: Diagnosis present

## 2014-01-24 DIAGNOSIS — I708 Atherosclerosis of other arteries: Secondary | ICD-10-CM | POA: Diagnosis present

## 2014-01-24 DIAGNOSIS — M869 Osteomyelitis, unspecified: Secondary | ICD-10-CM

## 2014-01-24 DIAGNOSIS — E10621 Type 1 diabetes mellitus with foot ulcer: Secondary | ICD-10-CM

## 2014-01-24 DIAGNOSIS — L97509 Non-pressure chronic ulcer of other part of unspecified foot with unspecified severity: Secondary | ICD-10-CM

## 2014-01-24 DIAGNOSIS — I252 Old myocardial infarction: Secondary | ICD-10-CM

## 2014-01-24 DIAGNOSIS — E669 Obesity, unspecified: Secondary | ICD-10-CM | POA: Insufficient documentation

## 2014-01-24 DIAGNOSIS — I129 Hypertensive chronic kidney disease with stage 1 through stage 4 chronic kidney disease, or unspecified chronic kidney disease: Secondary | ICD-10-CM | POA: Diagnosis present

## 2014-01-24 DIAGNOSIS — E1122 Type 2 diabetes mellitus with diabetic chronic kidney disease: Secondary | ICD-10-CM | POA: Diagnosis present

## 2014-01-24 DIAGNOSIS — Z7982 Long term (current) use of aspirin: Secondary | ICD-10-CM | POA: Diagnosis not present

## 2014-01-24 DIAGNOSIS — E1152 Type 2 diabetes mellitus with diabetic peripheral angiopathy with gangrene: Secondary | ICD-10-CM | POA: Diagnosis not present

## 2014-01-24 DIAGNOSIS — Z6834 Body mass index (BMI) 34.0-34.9, adult: Secondary | ICD-10-CM

## 2014-01-24 DIAGNOSIS — B955 Unspecified streptococcus as the cause of diseases classified elsewhere: Secondary | ICD-10-CM | POA: Diagnosis present

## 2014-01-24 DIAGNOSIS — T361X5A Adverse effect of cephalosporins and other beta-lactam antibiotics, initial encounter: Secondary | ICD-10-CM | POA: Diagnosis present

## 2014-01-24 DIAGNOSIS — I70262 Atherosclerosis of native arteries of extremities with gangrene, left leg: Secondary | ICD-10-CM | POA: Diagnosis not present

## 2014-01-24 DIAGNOSIS — Z794 Long term (current) use of insulin: Secondary | ICD-10-CM | POA: Diagnosis not present

## 2014-01-24 DIAGNOSIS — N179 Acute kidney failure, unspecified: Secondary | ICD-10-CM | POA: Diagnosis not present

## 2014-01-24 DIAGNOSIS — M868X7 Other osteomyelitis, ankle and foot: Secondary | ICD-10-CM | POA: Diagnosis present

## 2014-01-24 DIAGNOSIS — N183 Chronic kidney disease, stage 3 unspecified: Secondary | ICD-10-CM

## 2014-01-24 DIAGNOSIS — L97529 Non-pressure chronic ulcer of other part of left foot with unspecified severity: Secondary | ICD-10-CM | POA: Diagnosis present

## 2014-01-24 DIAGNOSIS — R197 Diarrhea, unspecified: Secondary | ICD-10-CM | POA: Diagnosis present

## 2014-01-24 DIAGNOSIS — L089 Local infection of the skin and subcutaneous tissue, unspecified: Secondary | ICD-10-CM

## 2014-01-24 DIAGNOSIS — E785 Hyperlipidemia, unspecified: Secondary | ICD-10-CM | POA: Diagnosis present

## 2014-01-24 DIAGNOSIS — R509 Fever, unspecified: Secondary | ICD-10-CM | POA: Diagnosis present

## 2014-01-24 DIAGNOSIS — I679 Cerebrovascular disease, unspecified: Secondary | ICD-10-CM | POA: Diagnosis present

## 2014-01-24 DIAGNOSIS — R5081 Fever presenting with conditions classified elsewhere: Secondary | ICD-10-CM

## 2014-01-24 DIAGNOSIS — E1151 Type 2 diabetes mellitus with diabetic peripheral angiopathy without gangrene: Secondary | ICD-10-CM

## 2014-01-24 DIAGNOSIS — E1165 Type 2 diabetes mellitus with hyperglycemia: Secondary | ICD-10-CM | POA: Diagnosis present

## 2014-01-24 DIAGNOSIS — Z79899 Other long term (current) drug therapy: Secondary | ICD-10-CM | POA: Diagnosis not present

## 2014-01-24 DIAGNOSIS — I2510 Atherosclerotic heart disease of native coronary artery without angina pectoris: Secondary | ICD-10-CM | POA: Diagnosis present

## 2014-01-24 DIAGNOSIS — N189 Chronic kidney disease, unspecified: Secondary | ICD-10-CM

## 2014-01-24 DIAGNOSIS — A419 Sepsis, unspecified organism: Secondary | ICD-10-CM | POA: Diagnosis not present

## 2014-01-24 DIAGNOSIS — E1169 Type 2 diabetes mellitus with other specified complication: Secondary | ICD-10-CM | POA: Insufficient documentation

## 2014-01-24 DIAGNOSIS — Z955 Presence of coronary angioplasty implant and graft: Secondary | ICD-10-CM | POA: Diagnosis not present

## 2014-01-24 DIAGNOSIS — M86172 Other acute osteomyelitis, left ankle and foot: Secondary | ICD-10-CM

## 2014-01-24 DIAGNOSIS — E11621 Type 2 diabetes mellitus with foot ulcer: Secondary | ICD-10-CM | POA: Diagnosis present

## 2014-01-24 DIAGNOSIS — E08621 Diabetes mellitus due to underlying condition with foot ulcer: Secondary | ICD-10-CM

## 2014-01-24 DIAGNOSIS — I96 Gangrene, not elsewhere classified: Secondary | ICD-10-CM | POA: Diagnosis present

## 2014-01-24 DIAGNOSIS — D72829 Elevated white blood cell count, unspecified: Secondary | ICD-10-CM

## 2014-01-24 DIAGNOSIS — F329 Major depressive disorder, single episode, unspecified: Secondary | ICD-10-CM | POA: Diagnosis present

## 2014-01-24 DIAGNOSIS — I10 Essential (primary) hypertension: Secondary | ICD-10-CM | POA: Diagnosis present

## 2014-01-24 LAB — COMPREHENSIVE METABOLIC PANEL
ALT: 27 U/L (ref 0–53)
AST: 33 U/L (ref 0–37)
Albumin: 2.4 g/dL — ABNORMAL LOW (ref 3.5–5.2)
Alkaline Phosphatase: 96 U/L (ref 39–117)
Anion gap: 17 — ABNORMAL HIGH (ref 5–15)
BUN: 17 mg/dL (ref 6–23)
CO2: 21 mEq/L (ref 19–32)
Calcium: 8.9 mg/dL (ref 8.4–10.5)
Chloride: 95 mEq/L — ABNORMAL LOW (ref 96–112)
Creatinine, Ser: 1.45 mg/dL — ABNORMAL HIGH (ref 0.50–1.35)
GFR calc Af Amer: 54 mL/min — ABNORMAL LOW (ref >90)
GFR calc non Af Amer: 46 mL/min — ABNORMAL LOW (ref >90)
Glucose, Bld: 164 mg/dL — ABNORMAL HIGH (ref 70–99)
Potassium: 4.3 mEq/L (ref 3.7–5.3)
Sodium: 133 mEq/L — ABNORMAL LOW (ref 137–147)
Total Bilirubin: 0.8 mg/dL (ref 0.3–1.2)
Total Protein: 8 g/dL (ref 6.0–8.3)

## 2014-01-24 LAB — GLUCOSE, CAPILLARY
Glucose-Capillary: 148 mg/dL — ABNORMAL HIGH (ref 70–99)
Glucose-Capillary: 176 mg/dL — ABNORMAL HIGH (ref 70–99)
Glucose-Capillary: 194 mg/dL — ABNORMAL HIGH (ref 70–99)
Glucose-Capillary: 231 mg/dL — ABNORMAL HIGH (ref 70–99)
Glucose-Capillary: 340 mg/dL — ABNORMAL HIGH (ref 70–99)

## 2014-01-24 LAB — CBC
HCT: 31.7 % — ABNORMAL LOW (ref 39.0–52.0)
Hemoglobin: 10.4 g/dL — ABNORMAL LOW (ref 13.0–17.0)
MCH: 27.1 pg (ref 26.0–34.0)
MCHC: 32.8 g/dL (ref 30.0–36.0)
MCV: 82.6 fL (ref 78.0–100.0)
Platelets: 325 10*3/uL (ref 150–400)
RBC: 3.84 MIL/uL — ABNORMAL LOW (ref 4.22–5.81)
RDW: 14.2 % (ref 11.5–15.5)
WBC: 16.2 10*3/uL — ABNORMAL HIGH (ref 4.0–10.5)

## 2014-01-24 LAB — HEMOGLOBIN A1C
Hgb A1c MFr Bld: 8.9 % — ABNORMAL HIGH (ref <5.7)
Mean Plasma Glucose: 209 mg/dL — ABNORMAL HIGH (ref <117)

## 2014-01-24 LAB — PROTIME-INR
INR: 1.33 (ref 0.00–1.49)
Prothrombin Time: 16.6 seconds — ABNORMAL HIGH (ref 11.6–15.2)

## 2014-01-24 MED ORDER — ACETAMINOPHEN 325 MG PO TABS
650.0000 mg | ORAL_TABLET | Freq: Four times a day (QID) | ORAL | Status: DC | PRN
  Administered 2014-01-24 – 2014-01-27 (×6): 650 mg via ORAL
  Filled 2014-01-24 (×6): qty 2

## 2014-01-24 MED ORDER — ASPIRIN 81 MG PO CHEW
81.0000 mg | CHEWABLE_TABLET | Freq: Every day | ORAL | Status: DC
  Administered 2014-01-24 – 2014-02-07 (×14): 81 mg via ORAL
  Filled 2014-01-24 (×16): qty 1

## 2014-01-24 MED ORDER — GLUCERNA SHAKE PO LIQD
237.0000 mL | Freq: Two times a day (BID) | ORAL | Status: DC
  Administered 2014-01-24 – 2014-02-07 (×14): 237 mL via ORAL

## 2014-01-24 MED ORDER — INSULIN GLARGINE 100 UNIT/ML ~~LOC~~ SOLN
40.0000 [IU] | Freq: Every day | SUBCUTANEOUS | Status: DC
  Administered 2014-01-24: 40 [IU] via SUBCUTANEOUS
  Filled 2014-01-24 (×2): qty 0.4

## 2014-01-24 MED ORDER — PIPERACILLIN-TAZOBACTAM 3.375 G IVPB
3.3750 g | Freq: Three times a day (TID) | INTRAVENOUS | Status: DC
  Administered 2014-01-24 – 2014-02-06 (×38): 3.375 g via INTRAVENOUS
  Filled 2014-01-24 (×42): qty 50

## 2014-01-24 MED ORDER — PRO-STAT SUGAR FREE PO LIQD
30.0000 mL | Freq: Two times a day (BID) | ORAL | Status: DC
  Administered 2014-01-24: 30 mL via ORAL
  Administered 2014-01-25: 09:00:00 via ORAL
  Administered 2014-01-25: 30 mL via ORAL
  Administered 2014-01-27 (×2): via ORAL
  Administered 2014-01-29 – 2014-02-07 (×18): 30 mL via ORAL
  Filled 2014-01-24 (×30): qty 30

## 2014-01-24 MED ORDER — ROSUVASTATIN CALCIUM 20 MG PO TABS
20.0000 mg | ORAL_TABLET | Freq: Every day | ORAL | Status: DC
  Administered 2014-01-24: 20 mg via ORAL
  Filled 2014-01-24: qty 1

## 2014-01-24 MED ORDER — METOPROLOL SUCCINATE ER 100 MG PO TB24
100.0000 mg | ORAL_TABLET | Freq: Two times a day (BID) | ORAL | Status: DC
  Administered 2014-01-24 – 2014-01-25 (×4): 100 mg via ORAL
  Filled 2014-01-24 (×5): qty 1

## 2014-01-24 MED ORDER — HYDROCODONE-ACETAMINOPHEN 5-325 MG PO TABS
1.0000 | ORAL_TABLET | ORAL | Status: DC | PRN
  Administered 2014-01-25 – 2014-02-03 (×8): 2 via ORAL
  Filled 2014-01-24 (×8): qty 2

## 2014-01-24 MED ORDER — INSULIN GLARGINE 100 UNIT/ML ~~LOC~~ SOLN
38.0000 [IU] | Freq: Every day | SUBCUTANEOUS | Status: DC
  Administered 2014-01-24: 38 [IU] via SUBCUTANEOUS
  Filled 2014-01-24 (×2): qty 0.38

## 2014-01-24 MED ORDER — ONDANSETRON HCL 4 MG PO TABS
4.0000 mg | ORAL_TABLET | Freq: Four times a day (QID) | ORAL | Status: DC | PRN
  Administered 2014-02-03: 4 mg via ORAL
  Filled 2014-01-24: qty 1

## 2014-01-24 MED ORDER — ENOXAPARIN SODIUM 40 MG/0.4ML ~~LOC~~ SOLN
40.0000 mg | SUBCUTANEOUS | Status: DC
  Administered 2014-01-25 – 2014-01-27 (×2): 40 mg via SUBCUTANEOUS
  Filled 2014-01-24 (×4): qty 0.4

## 2014-01-24 MED ORDER — SODIUM CHLORIDE 0.9 % IV SOLN
INTRAVENOUS | Status: AC
  Administered 2014-01-24 (×2): via INTRAVENOUS

## 2014-01-24 MED ORDER — AMLODIPINE BESYLATE 10 MG PO TABS
10.0000 mg | ORAL_TABLET | Freq: Every day | ORAL | Status: DC
  Administered 2014-01-24 – 2014-02-07 (×14): 10 mg via ORAL
  Filled 2014-01-24 (×15): qty 1

## 2014-01-24 MED ORDER — PIPERACILLIN-TAZOBACTAM 3.375 G IVPB 30 MIN
3.3750 g | Freq: Once | INTRAVENOUS | Status: AC
  Administered 2014-01-24: 3.375 g via INTRAVENOUS
  Filled 2014-01-24: qty 50

## 2014-01-24 MED ORDER — INSULIN ASPART 100 UNIT/ML ~~LOC~~ SOLN
0.0000 [IU] | Freq: Three times a day (TID) | SUBCUTANEOUS | Status: DC
  Administered 2014-01-24: 4 [IU] via SUBCUTANEOUS
  Administered 2014-01-25: 11 [IU] via SUBCUTANEOUS
  Administered 2014-01-25 (×2): 7 [IU] via SUBCUTANEOUS
  Administered 2014-01-26: 4 [IU] via SUBCUTANEOUS
  Administered 2014-01-26 – 2014-01-27 (×3): 3 [IU] via SUBCUTANEOUS
  Administered 2014-01-27: 4 [IU] via SUBCUTANEOUS
  Administered 2014-01-27 – 2014-01-28 (×2): 7 [IU] via SUBCUTANEOUS
  Administered 2014-01-29: 4 [IU] via SUBCUTANEOUS
  Administered 2014-01-29 (×2): 7 [IU] via SUBCUTANEOUS
  Administered 2014-01-30: 11 [IU] via SUBCUTANEOUS
  Administered 2014-01-30: 4 [IU] via SUBCUTANEOUS
  Administered 2014-01-30: 7 [IU] via SUBCUTANEOUS
  Administered 2014-01-31: 11 [IU] via SUBCUTANEOUS
  Administered 2014-01-31: 4 [IU] via SUBCUTANEOUS
  Administered 2014-01-31: 7 [IU] via SUBCUTANEOUS
  Administered 2014-02-01: 11 [IU] via SUBCUTANEOUS
  Administered 2014-02-01: 7 [IU] via SUBCUTANEOUS
  Administered 2014-02-01: 4 [IU] via SUBCUTANEOUS
  Administered 2014-02-03 – 2014-02-04 (×3): 3 [IU] via SUBCUTANEOUS
  Administered 2014-02-04: 7 [IU] via SUBCUTANEOUS
  Administered 2014-02-05: 3 [IU] via SUBCUTANEOUS
  Administered 2014-02-05: 4 [IU] via SUBCUTANEOUS
  Administered 2014-02-06: 7 [IU] via SUBCUTANEOUS
  Administered 2014-02-07: 4 [IU] via SUBCUTANEOUS
  Administered 2014-02-07: 3 [IU] via SUBCUTANEOUS

## 2014-01-24 MED ORDER — ONDANSETRON HCL 4 MG/2ML IJ SOLN
4.0000 mg | Freq: Four times a day (QID) | INTRAMUSCULAR | Status: DC | PRN
Start: 2014-01-24 — End: 2014-02-07

## 2014-01-24 MED ORDER — CILOSTAZOL 50 MG PO TABS: 50.0000 mg | ORAL_TABLET | Freq: Two times a day (BID) | ORAL | Status: DC

## 2014-01-24 MED ORDER — INSULIN ASPART 100 UNIT/ML ~~LOC~~ SOLN
0.0000 [IU] | Freq: Three times a day (TID) | SUBCUTANEOUS | Status: DC
Start: 2014-01-24 — End: 2014-01-24
  Administered 2014-01-24: 5 [IU] via SUBCUTANEOUS
  Administered 2014-01-24: 2 [IU] via SUBCUTANEOUS

## 2014-01-24 MED ORDER — ISOSORBIDE MONONITRATE ER 30 MG PO TB24
30.0000 mg | ORAL_TABLET | Freq: Every day | ORAL | Status: DC
  Administered 2014-01-24 – 2014-02-01 (×8): 30 mg via ORAL
  Filled 2014-01-24 (×9): qty 1

## 2014-01-24 MED ORDER — ENOXAPARIN SODIUM 30 MG/0.3ML ~~LOC~~ SOLN
30.0000 mg | SUBCUTANEOUS | Status: DC
  Administered 2014-01-24: 30 mg via SUBCUTANEOUS
  Filled 2014-01-24: qty 0.3

## 2014-01-24 MED ORDER — ALBUTEROL SULFATE (2.5 MG/3ML) 0.083% IN NEBU: 2.5000 mg | INHALATION_SOLUTION | RESPIRATORY_TRACT | Status: DC | PRN

## 2014-01-24 MED ORDER — VANCOMYCIN HCL 10 G IV SOLR
1500.0000 mg | INTRAVENOUS | Status: DC
  Administered 2014-01-24 – 2014-01-30 (×7): 1500 mg via INTRAVENOUS
  Filled 2014-01-24 (×9): qty 1500

## 2014-01-24 MED ORDER — INSULIN ASPART 100 UNIT/ML ~~LOC~~ SOLN
0.0000 [IU] | Freq: Every day | SUBCUTANEOUS | Status: DC
  Administered 2014-01-24: 4 [IU] via SUBCUTANEOUS
  Administered 2014-01-25 – 2014-01-28 (×3): 2 [IU] via SUBCUTANEOUS
  Administered 2014-01-29 – 2014-01-30 (×2): 3 [IU] via SUBCUTANEOUS
  Administered 2014-01-31 – 2014-02-06 (×4): 2 [IU] via SUBCUTANEOUS

## 2014-01-24 MED ORDER — ROSUVASTATIN CALCIUM 40 MG PO TABS
40.0000 mg | ORAL_TABLET | Freq: Every day | ORAL | Status: DC
  Administered 2014-01-25 – 2014-02-07 (×13): 40 mg via ORAL
  Filled 2014-01-24 (×14): qty 1

## 2014-01-24 MED ORDER — ACETAMINOPHEN 650 MG RE SUPP
650.0000 mg | Freq: Four times a day (QID) | RECTAL | Status: DC | PRN
Start: 2014-01-24 — End: 2014-02-07

## 2014-01-24 NOTE — Progress Notes (Signed)
INITIAL NUTRITION ASSESSMENT  DOCUMENTATION CODES Per approved criteria  -Obesity Unspecified   INTERVENTION: Provide Glucerna Shake po BID, each supplement provides 220 kcal and 10 grams of protein.  Provide 30 ml Prostat po BID, each supplement provides 100 kcal and 15 grams of protein.  NUTRITION DIAGNOSIS: Increased nutrient needs related to diabetic ulcer as evidenced by estimated nutrition needs.   Goal: Pt to meet >/= 90% of their estimated nutrition needs   Monitor:  PO intake, weight trends, labs, I/O's  Reason for Assessment: MST  73 y.o. male  Admitting Dx: Diabetic foot ulcer  ASSESSMENT: Pt with coronary artery disease, carotid stenosis, type 2 diabetes, hypertension, hyperlipidemia, and peripheral vascular disease. Patient reports that about 2 weeks ago, 01/10/2014 he had a diabetic foot ulcer. Subsequently since then he has had daily episode of diarrhea which is watery and loose. The ulcer in his left plantar foot has not healed properly. In the last 24 hours he has been having fevers and chills.  Plan for surgery possibly tomorrow per MD.  Pt reports having a lack of appetite. Meal completion is 25-50%. Pt reports he has had a decrease appetite over the past 2 weeks and reports on consuming 1-2 meals a day, which consists of soup and little or light snacks. Pt reports prior to his decreased appetite, he was eating well with 3 full meals a day. Pt reports his usual body weight of 235 lbs. Per [facility] weight records, pt's weight has been stable. Pt is agreeable to oral supplements. Will order. Pt was educated on increased protein needs due to his ulcer. Pt expressed understanding. Pt was encouraged to eat his food at his meals.  Pt with no observed significant fat or muscle mass loss.   Labs: Low sodium, chloride, and GFR. High creatinine.  Height: Ht Readings from Last 1 Encounters:  01/24/14 5' 8.9" (1.75 m)    Weight: Wt Readings from Last 1 Encounters:   01/24/14 228 lb 8 oz (103.647 kg)    Ideal Body Weight: 159 lbs  % Ideal Body Weight: 143%  Wt Readings from Last 10 Encounters:  01/24/14 228 lb 8 oz (103.647 kg)  04/27/13 227 lb 6.4 oz (103.148 kg)  03/02/13 226 lb (102.513 kg)  02/05/13 226 lb 12.8 oz (102.876 kg)  01/09/11 226 lb (102.513 kg)  12/24/10 223 lb (101.152 kg)  12/19/10 224 lb (101.606 kg)  04/06/10 227 lb (102.967 kg)  02/06/09 225 lb (102.059 kg)    Usual Body Weight: 235 lbs  % Usual Body Weight: 97%  BMI:  Body mass index is 33.84 kg/(m^2). Class I obesity  Estimated Nutritional Needs: Kcal: 2000-2250 Protein: 110-125 grams Fluid: 2 - 2.25 L/day  Skin: diabetic foot ulcer  Diet Order: Carb Control  EDUCATION NEEDS: -Education needs addressed  No intake or output data in the 24 hours ending 01/24/14 0955  Last BM: 10/26  Labs:   Recent Labs Lab 01/24/14 0535  NA 133*  K 4.3  CL 95*  CO2 21  BUN 17  CREATININE 1.45*  CALCIUM 8.9  GLUCOSE 164*    CBG (last 3)   Recent Labs  01/24/14 0054 01/24/14 0747  GLUCAP 176* 148*    Scheduled Meds: . amLODipine  10 mg Oral Daily  . aspirin  81 mg Oral Daily  . cilostazol  50 mg Oral BID  . enoxaparin (LOVENOX) injection  30 mg Subcutaneous Q24H  . insulin aspart  0-15 Units Subcutaneous TID WC  . insulin aspart  0-5 Units Subcutaneous QHS  . insulin glargine  40 Units Subcutaneous QHS  . isosorbide mononitrate  30 mg Oral Daily  . metoprolol succinate  100 mg Oral BID  . piperacillin-tazobactam (ZOSYN)  IV  3.375 g Intravenous Q8H  . rosuvastatin  20 mg Oral Daily  . vancomycin  1,500 mg Intravenous Q24H    Continuous Infusions: . sodium chloride 75 mL/hr at 01/24/14 0201    Past Medical History  Diagnosis Date  . CAD (coronary artery disease)     a. s/p IMI in past tx with POBA and cath 6 mos later with occluded RCA;  b. h/o Taxus DES to LAD and CFX;  c.  cath 1/10: LM ok, mLAD 40-50%, LAD stent ok, D2 tandem 60-70%,  pCFX 30%, mAVCFX stent ok, pRCA 40%, RV 90%, RV marginal 90%, dRCA filled L-R collats tx medically  . Carotid stenosis     dopplers 5/11: 40-59% bilat  . DM2 (diabetes mellitus, type 2)   . HTN (hypertension)   . HLD (hyperlipidemia)   . Diabetes mellitus     Past Surgical History  Procedure Laterality Date  . Cardiac catheterization      Marijean NiemannStephanie La, MS, RD, LDN Pager # [phone] After hours/ weekend pager # [phone]

## 2014-01-24 NOTE — Progress Notes (Signed)
Patient oriented to unit, room and routine. Armband verified. Side rails in place, fall risk assessment complete with patient verbalizing understanding associated with falls. Call light within reach with patient expressing the need to call before getting ou of bed.

## 2014-01-24 NOTE — Progress Notes (Signed)
AM temp of 100.6. MD paged. No order received at this time. Will continue to monitor patient.

## 2014-01-24 NOTE — Progress Notes (Signed)
Tech offered Pt. a bath, Pt. stated he will wait for wife to come. Bath and Oral supplies given to Pt.

## 2014-01-24 NOTE — Progress Notes (Signed)
Patient ID: Jordan [name], Jordan [name]   DOB: Sep 13, 1940, 73 y.o.   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  Will get ABI and plan for surgery possibly tomorrow.

## 2014-01-24 NOTE — Consult Note (Signed)
Reason for Consult: Ischemic ulcer left foot fifth metatarsal head Referring Physician: Dr. Malena Edman Polak is an 73 y.o. male.  HPI: Patient is a 73 year old gentleman with diabetes hypertension peripheral vascular disease who presents with ulceration of the left foot fifth metatarsal head.  Past Medical History  Diagnosis Date  . CAD (coronary artery disease)     a. s/p IMI in past tx with POBA and cath 6 mos later with occluded RCA;  b. h/o Taxus DES to LAD and CFX;  c.  cath 1/10: LM ok, mLAD 40-50%, LAD stent ok, D2 tandem 60-70%, pCFX 30%, mAVCFX stent ok, pRCA 40%, RV 90%, RV marginal 90%, dRCA filled L-R collats tx medically  . Carotid stenosis     dopplers 5/11: 40-59% bilat  . DM2 (diabetes mellitus, type 2)   . HTN (hypertension)   . HLD (hyperlipidemia)   . Diabetes mellitus     Past Surgical History  Procedure Laterality Date  . Cardiac catheterization      Family History  Problem Relation Age of Onset  . Diabetes Mother   . Diabetes Sister     Social History:  reports that he has quit smoking. He does not have any smokeless tobacco history on file. He reports that he does not drink alcohol or use illicit drugs.  Allergies:  Allergies  Allergen Reactions  . Cilostazol Other (See Comments)    Feels like feet on fire    Medications: I have reviewed the patient's current medications.  Results for orders placed during the hospital encounter of 01/24/14 (from the past 48 hour(s))  GLUCOSE, [facility]     Status: Abnormal   Collection Time    01/24/14 12:54 AM      Result Value Ref Range   Glucose-Capillary 176 (*) 70 - 99 mg/dL  CBC     Status: Abnormal   Collection Time    01/24/14  5:35 AM      Result Value Ref Range   WBC 16.2 (*) 4.0 - 10.5 K/uL   RBC 3.84 (*) 4.22 - 5.81 MIL/uL   Hemoglobin 10.4 (*) 13.0 - 17.0 g/dL   HCT 31.7 (*) 39.0 - 52.0 %   MCV 82.6  78.0 - 100.0 fL   MCH 27.1  26.0 - 34.0 pg   MCHC 32.8  30.0 - 36.0 g/dL   RDW 14.2   11.5 - 15.5 %   Platelets 325  150 - 400 K/uL  COMPREHENSIVE METABOLIC PANEL     Status: Abnormal   Collection Time    01/24/14  5:35 AM      Result Value Ref Range   Sodium 133 (*) 137 - 147 mEq/L   Potassium 4.3  3.7 - 5.3 mEq/L   Chloride 95 (*) 96 - 112 mEq/L   CO2 21  19 - 32 mEq/L   Glucose, Bld 164 (*) 70 - 99 mg/dL   BUN 17  6 - 23 mg/dL   Creatinine, Ser 1.45 (*) 0.50 - 1.35 mg/dL   Calcium 8.9  8.4 - 10.5 mg/dL   Total Protein 8.0  6.0 - 8.3 g/dL   Albumin 2.4 (*) 3.5 - 5.2 g/dL   AST 33  0 - 37 U/L   ALT 27  0 - 53 U/L   Alkaline Phosphatase 96  39 - 117 U/L   Total Bilirubin 0.8  0.3 - 1.2 mg/dL   GFR calc non Af Amer 46 (*) >90 mL/min   GFR calc Af Wyvonnia Lora  54 (*) >90 mL/min   Comment: (NOTE)     The eGFR has been calculated using the CKD EPI equation.     This calculation has not been validated in all clinical situations.     eGFR's persistently <90 mL/min signify possible Chronic Kidney     Disease.   Anion gap 17 (*) 5 - 15  PROTIME-INR     Status: Abnormal   Collection Time    01/24/14  5:35 AM      Result Value Ref Range   Prothrombin Time 16.6 (*) 11.6 - 15.2 seconds   INR 1.33  0.00 - 1.49  HEMOGLOBIN A1C     Status: Abnormal   Collection Time    01/24/14  5:35 AM      Result Value Ref Range   Hemoglobin A1C 8.9 (*) <5.7 %   Comment: (NOTE)                                                                               According to the ADA Clinical Practice Recommendations for 2011, when     HbA1c is used as a screening test:      >=6.5%   Diagnostic of Diabetes Mellitus               (if abnormal result is confirmed)     5.7-6.4%   Increased risk of developing Diabetes Mellitus     References:Diagnosis and Classification of Diabetes Mellitus,Diabetes     NIOE,7035,00(XFGHW 1):S62-S69 and Standards of Medical Care in             Diabetes - 2011,Diabetes Care,2011,34 (Suppl 1):S11-S61.   Mean Plasma Glucose 209 (*) <117 mg/dL   Comment: Performed at  [place], [facility]     Status: Abnormal   Collection Time    01/24/14  7:47 AM      Result Value Ref Range   Glucose-Capillary 148 (*) 70 - 99 mg/dL  GLUCOSE, CAPILLARY     Status: Abnormal   Collection Time    01/24/14 11:55 AM      Result Value Ref Range   Glucose-Capillary 231 (*) 70 - 99 mg/dL  GLUCOSE, CAPILLARY     Status: Abnormal   Collection Time    01/24/14  5:31 PM      Result Value Ref Range   Glucose-Capillary 194 (*) 70 - 99 mg/dL    Dg Foot Complete Left  01/24/2014   CLINICAL DATA:  Diabetic foot ulcer  EXAM: LEFT FOOT - COMPLETE 3+ VIEW  COMPARISON:  None.  FINDINGS: Soft tissue swelling is noted as well as a considerable amount of air within the soft tissues surrounding the fifth MTP joint. This extends proximally along the plantar aspect of the foot similar to that seen on the prior exam. Mild degenerative changes are noted although no aggressive lytic changes to suggest osteomyelitis is seen.  IMPRESSION: Considerable soft tissue gas infection as described. This is stable from the similar film 13 hr previous. Again MRI is recommended if there is clinical concern for osteomyelitis.   Electronically Signed   By: Inez Catalina M.D.   On: 01/24/2014 10:41    Review of  Systems  All other systems reviewed and are negative.  Blood pressure 127/50, pulse 93, temperature 99.7 F (37.6 C), temperature source Oral, resp. rate 18, height 5' 8.9" (1.75 m), weight 103.647 kg (228 lb 8 oz), SpO2 97.00%. Physical Exam On examination patient does not have palpable pulses. He has a black gangrenous ulcer to the fifth metatarsal head. Radiograph shows air beneath the soft tissue. There is no ascending cellulitis no signs of abscess. Review of his labs show a ankle brachial indices obtained in January of this year showed significant distal SFA disease. Assessment/Plan: Assessment: Severe peripheral vascular disease with distal SFA disease identified in January of  this year with an ischemic ulcer to the little toe fifth metatarsal head.  Plan: Ankle-brachial indices were ordered this morning they have not been obtained yet. Would recommend vascular consultation for evaluation for possible revascularization.  DUDA,MARCUS V 01/24/2014, 8:15 PM

## 2014-01-24 NOTE — Progress Notes (Signed)
[facility] Progress Note   Jordan [name] [id number] DOB: 1940-06-03 DOA: 01/24/2014 PCP: Lindwood QuaHOFFMAN,BYRON, MD  Brief narrative: Jordan [name] is a 73 y.o. male with diabetic foot ulcer on left foot.    Subjective: No complaints of pain.   Assessment/Plan: Principal Problem:   Gngrene left foot/ sepsis/ leukocytosis - have consulted ortho - cont Zosyn and Vanc  Active Problems:   Diabetes Mellitus 2, insulin requiring - cont Lantus and Novolog- have cut back on Lantus for tomorrow morning as he is NPO for surgery - takeshigh dose Novolog at home (30 U) with meals- will place on a resistant sliding scale     Essential hypertension - cont Imdur, Norvasc, Toprol    Coronary atherosclerosis Cont ASA    Peripheral vascular disease - cont ASA-     CKD (chronic kidney disease), stage III - Cr is near baseline- - baseline appears to be 1.2-1.3  Code Status: Full code Family Communication: none Disposition Plan: to be determined DVT prophylaxis: Lovenox  Consultants: ortho  Procedures: none  Antibiotics: Anti-infectives   Start     Dose/Rate Route Frequency Ordered Stop   01/24/14 2200  vancomycin (VANCOCIN) 1,500 mg in sodium chloride 0.9 % 500 mL IVPB     1,500 mg 250 mL/hr over 120 Minutes Intravenous Every 24 hours 01/24/14 0232     01/24/14 0800  piperacillin-tazobactam (ZOSYN) IVPB 3.375 g     3.375 g 12.5 mL/hr over 240 Minutes Intravenous Every 8 hours 01/24/14 0232     01/24/14 0245  piperacillin-tazobactam (ZOSYN) IVPB 3.375 g     3.375 g 100 mL/hr over 30 Minutes Intravenous  Once 01/24/14 0232 01/24/14 0336         Objective: Filed Weights   01/24/14 0057  Weight: 103.647 kg (228 lb 8 oz)    Intake/Output Summary (Last 24 hours) at 01/24/14 1812 Last data filed at 01/24/14 1514  Gross per 24 hour  Intake   1310 ml  Output      0 ml  Net   1310 ml     Vitals Filed Vitals:   01/24/14 0057 01/24/14 0100 01/24/14 0403 01/24/14  0959  BP: 161/68  115/57 134/62  Pulse: 121  85 101  Temp: 100 F (37.8 C)  100.6 F (38.1 C) 100.8 F (38.2 C)  TempSrc: Oral  Oral Oral  Resp: 14  14 20   Height:  5' 8.9" (1.75 m)    Weight: 103.647 kg (228 lb 8 oz)     SpO2: 98%  99% 95%    Exam: General: No acute respiratory distress Lungs: Clear to auscultation bilaterally without wheezes or crackles Cardiovascular: Regular rate and rhythm without murmur gallop or rub normal S1 and S2 Abdomen: Nontender, nondistended, soft, bowel sounds positive, no rebound, no ascites, no appreciable mass Extremities: No significant cyanosis, clubbing, or edema bilateral lower extremities Skin: right foot - necrotic tissue on lateral aspect of foot, foul smelling  Data Reviewed: Basic Metabolic Panel:  Recent Labs Lab 01/24/14 0535  NA 133*  K 4.3  CL 95*  CO2 21  GLUCOSE 164*  BUN 17  CREATININE 1.45*  CALCIUM 8.9   Liver Function Tests:  Recent Labs Lab 01/24/14 0535  AST 33  ALT 27  ALKPHOS 96  BILITOT 0.8  PROT 8.0  ALBUMIN 2.4*   No results found for this basename: LIPASE, AMYLASE,  in the last 168 hours No results found for this basename: AMMONIA,  in the last 168 hours CBC:  Recent Labs Lab 01/24/14 0535  WBC 16.2*  HGB 10.4*  HCT 31.7*  MCV 82.6  PLT 325   Cardiac Enzymes: No results found for this basename: CKTOTAL, CKMB, CKMBINDEX, TROPONINI,  in the last 168 hours BNP (last 3 results) No results found for this basename: PROBNP,  in the last 8760 hours CBG:  Recent Labs Lab 01/24/14 0054 01/24/14 0747 01/24/14 1155 01/24/14 1731  GLUCAP 176* 148* 231* 194*    No results found for this or any previous visit (from the past 240 hour(s)).   Studies:  Recent x-ray studies have been reviewed in detail by the Attending Physician  Scheduled Meds:  Scheduled Meds: . amLODipine  10 mg Oral Daily  . aspirin  81 mg Oral Daily  . [START ON 01/25/2014] enoxaparin (LOVENOX) injection  40 mg  Subcutaneous Q24H  . feeding supplement (GLUCERNA SHAKE)  237 mL Oral BID BM  . feeding supplement (PRO-STAT SUGAR FREE 64)  30 mL Oral BID BM  . insulin aspart  0-20 Units Subcutaneous TID WC  . insulin aspart  0-5 Units Subcutaneous QHS  . insulin glargine  38 Units Subcutaneous QHS  . isosorbide mononitrate  30 mg Oral Daily  . metoprolol succinate  100 mg Oral BID  . piperacillin-tazobactam (ZOSYN)  IV  3.375 g Intravenous Q8H  . [START ON 01/25/2014] rosuvastatin  40 mg Oral Daily  . vancomycin  1,500 mg Intravenous Q24H   Continuous Infusions: . sodium chloride 75 mL/hr at 01/24/14 0201    Time spent on care of this patient: >45 min   Fedrick Cefalu, MD 01/24/2014, 6:12 PM  LOS: 0 days   [facility] Office  [phone] Pager - Text Page per [email or web address]  If 7PM-7AM, please contact night-coverage [email or web address]

## 2014-01-24 NOTE — Progress Notes (Signed)
ANTIBIOTIC CONSULT NOTE - INITIAL  Pharmacy Consult for Vancocin and Zosyn Indication: cellulitis/diabetic ulcer, r/o osteo  Allergies  Allergen Reactions  . Cilostazol     Patient Measurements: Height: 5' 8.9" (175 cm) Weight: 228 lb 8 oz (103.647 kg) IBW/kg (Calculated) : 70.47  Vital Signs: Temp: 100 F (37.8 C) (10/26 0057) Temp Source: Oral (10/26 0057) BP: 161/68 mmHg (10/26 0057) Pulse Rate: 121 (10/26 0057)   Medical History: Past Medical History  Diagnosis Date  . CAD (coronary artery disease)     a. s/p IMI in past tx with POBA and cath 6 mos later with occluded RCA;  b. h/o Taxus DES to LAD and CFX;  c.  cath 1/10: LM ok, mLAD 40-50%, LAD stent ok, D2 tandem 60-70%, pCFX 30%, mAVCFX stent ok, pRCA 40%, RV 90%, RV marginal 90%, dRCA filled L-R collats tx medically  . Carotid stenosis     dopplers 5/11: 40-59% bilat  . DM2 (diabetes mellitus, type 2)   . HTN (hypertension)   . HLD (hyperlipidemia)   . Diabetes mellitus     Medications:  Prescriptions prior to admission  Medication Sig Dispense Refill  . amLODipine (NORVASC) 10 MG tablet Take 10 mg by mouth daily.      [name]. aspirin 81 MG tablet Take 81 mg by mouth daily.        . cephALEXin (KEFLEX) 500 MG capsule Take 1 capsule (500 mg total) by mouth 3 (three) times daily.  30 capsule  0  . cilostazol (PLETAL) 50 MG tablet Take 1 tablet (50 mg total) by mouth 2 (two) times daily.      . insulin aspart (NOVOLOG) 100 UNIT/ML injection Inject 20 Units into the skin 3 (three) times daily with meals.       . insulin glargine (LANTUS) 100 UNIT/ML injection Inject 40 Units into the skin at bedtime.      . isosorbide mononitrate (IMDUR) 30 MG 24 hr tablet Take 30 mg by mouth daily.      [name]. losartan (COZAAR) 50 MG tablet Take 50 mg by mouth daily.      . metformin (FORTAMET) 1000 MG (OSM) 24 hr tablet Take 1,000 mg by mouth 2 (two) times daily.        . metoprolol (TOPROL-XL) 100 MG 24 hr tablet Take 100 mg by mouth 2  (two) times daily.        . nitroGLYCERIN (NITROSTAT) 0.4 MG SL tablet Place 0.4 mg under the tongue every 5 (five) minutes as needed.        . rosuvastatin (CRESTOR) 20 MG tablet Take 20 mg by mouth daily.        . silver sulfADIAZINE (SILVADENE) 1 % cream Apply 1 application topically daily.  50 g  2   Scheduled:  . amLODipine  10 mg Oral Daily  . aspirin  81 mg Oral Daily  . cilostazol  50 mg Oral BID  . enoxaparin (LOVENOX) injection  30 mg Subcutaneous Q24H  . insulin glargine  40 Units Subcutaneous QHS  . isosorbide mononitrate  30 mg Oral Daily  . metoprolol succinate  100 mg Oral BID  . rosuvastatin  20 mg Oral Daily   Infusions:  . sodium chloride 75 mL/hr at 01/24/14 0201    Assessment: 73yo male w/ DM and PVD c/o diabetic foot ulcer x2wk w/ diarrhea starting after began Keflex and w/o proper healing of ulcer, now w/ fevers and chills, Xray at OSH showed soft-tissue gas/infection, tx'd to  [facility], to begin IV ABX.  Labs from OSH: WBC 17.4, Hgb 11.7, Plt 355, K 4.6, SCr 1.9 (baseline closer to 1.3)  Goal of Therapy:  Vancomycin trough level 15-20 mcg/ml (will decrease if osteo r/o)  Plan:  Rec'd vanc 1.5g and clinda at OSH; will continue with vancomycin 1500mg  IV Q24H and begin Zosyn 3.375g IV Q8H and monitor CBC, CrCr, Cx, levels prn.  Vernard GamblesVeronda Lagina Reader, PharmD, BCPS  01/24/2014,2:18 AM

## 2014-01-24 NOTE — H&P (Addendum)
Patient's PCP: [name], MD Patient's cardiologist: Dr. Jens Som Patient's cardiology/vascular specalist: Dr. Kirke Corin Patient's podiatrist: Dr. Ralene Cork  Chief Complaint: Fevers and chills with worsening diabetic foot ulcer  History of Present Illness: Jordan [name] is a 73 y.o. African-American male with coronary artery disease, carotid stenosis, type 2 diabetes, hypertension, hyperlipidemia, and peripheral vascular disease who presents with the above complaints.  Patient reports that about 2 weeks ago, 01/10/2014 he had a diabetic foot ulcer and was started on Keflex.  Subsequently since then he has had daily episode of diarrhea which is watery and loose.  The ulcer in his left plantar foot has not healed properly.  In the last 24 hours he has been having fevers and chills, as a result he presented to [facility] for further evaluation.  X-ray of the foot showed soft tissue gas/infection centered over the plantar surface of the fifth metatarsophalangeal joint.  His creatinine was 1.9.  He was transferred to [facility] for further care and management.  He denies any nausea, vomiting, chest pain, shortness of breath, abdominal pain, headaches or vision changes.  Review of Systems: All systems reviewed with the patient and positive as per history of present illness, otherwise all other systems are negative.  Past Medical History  Diagnosis Date  . CAD (coronary artery disease)     a. s/p IMI in past tx with POBA and cath 6 mos later with occluded RCA;  b. h/o Taxus DES to LAD and CFX;  c.  cath 1/10: LM ok, mLAD 40-50%, LAD stent ok, D2 tandem 60-70%, pCFX 30%, mAVCFX stent ok, pRCA 40%, RV 90%, RV marginal 90%, dRCA filled L-R collats tx medically  . Carotid stenosis     dopplers 5/11: 40-59% bilat  . DM2 (diabetes mellitus, type 2)   . HTN (hypertension)   . HLD (hyperlipidemia)   . Diabetes mellitus    Past Surgical History  Procedure Laterality Date  . Cardiac  catheterization     Family History  Problem Relation Age of Onset  . Diabetes Mother   . Diabetes Sister    History   Social History  . Marital Status: Married    Spouse Name: N/A    Number of Children: N/A  . Years of Education: N/A   Occupational History  . Not on file.   Social History Main Topics  . Smoking status: Former Games developer  . Smokeless tobacco: Not on file  . Alcohol Use: No  . Drug Use: No  . Sexual Activity: Not on file   Other Topics Concern  . Not on file   Social History Narrative  . No narrative on file   Allergies: Cilostazol  Home Meds: Prior to Admission medications   Medication Sig Start Date End Date Taking? Authorizing Provider  amLODipine (NORVASC) 10 MG tablet Take 10 mg by mouth daily.    Historical Provider, MD  aspirin 81 MG tablet Take 81 mg by mouth daily.      Historical Provider, MD  cephALEXin (KEFLEX) 500 MG capsule Take 1 capsule (500 mg total) by mouth 3 (three) times daily. 01/17/14   Alvan Dame, DPM  cilostazol (PLETAL) 50 MG tablet Take 1 tablet (50 mg total) by mouth 2 (two) times daily. 05/13/13   [name], MD  insulin aspart (NOVOLOG) 100 UNIT/ML injection Inject 20 Units into the skin 3 (three) times daily with meals.     Historical Provider, MD  insulin glargine (LANTUS) 100 UNIT/ML injection Inject 40 Units into  the skin at bedtime.    Historical Provider, MD  isosorbide mononitrate (IMDUR) 30 MG 24 hr tablet Take 30 mg by mouth daily.    Historical Provider, MD  losartan (COZAAR) 50 MG tablet Take 50 mg by mouth daily.    Historical Provider, MD  metformin (FORTAMET) 1000 MG (OSM) 24 hr tablet Take 1,000 mg by mouth 2 (two) times daily.      Historical Provider, MD  metoprolol (TOPROL-XL) 100 MG 24 hr tablet Take 100 mg by mouth 2 (two) times daily.      Historical Provider, MD  nitroGLYCERIN (NITROSTAT) 0.4 MG SL tablet Place 0.4 mg under the tongue every 5 (five) minutes as needed.      Historical Provider, MD   rosuvastatin (CRESTOR) 20 MG tablet Take 20 mg by mouth daily.      Historical Provider, MD  silver sulfADIAZINE (SILVADENE) 1 % cream Apply 1 application topically daily. 01/10/14   Alvan Dameichard Sikora, DPM    Physical Exam: Blood pressure 161/68, pulse 121, temperature 100 F (37.8 C), temperature source Oral, resp. rate 14, weight 103.647 kg (228 lb 8 oz), SpO2 98.00%. General: Awake, Oriented x3, No acute distress, warm to touch. HEENT: EOMI, Moist mucous membranes Neck: Supple CV: S1 and S2 Lungs: Clear to ascultation bilaterally Abdomen: Soft, Nontender, Nondistended, +bowel sounds. Ext: Good pulses. Trace edema.  Foul-smelling odor from left foot with plantar ulcer over the fifth metatarsal larger than the size of a quarter.  Right great toe some erythema. Neuro: Cranial Nerves II-XII grossly intact. Has 5/5 motor strength in upper and lower extremities.  Lab results: No results found for this basename: NA, K, CL, CO2, GLUCOSE, BUN, CREATININE, CALCIUM, MG, PHOS,  in the last 72 hours No results found for this basename: AST, ALT, ALKPHOS, BILITOT, PROT, ALBUMIN,  in the last 72 hours No results found for this basename: LIPASE, AMYLASE,  in the last 72 hours No results found for this basename: WBC, NEUTROABS, HGB, HCT, MCV, PLT,  in the last 72 hours No results found for this basename: CKTOTAL, CKMB, CKMBINDEX, TROPONINI,  in the last 72 hours No components found with this basename: POCBNP,  No results found for this basename: DDIMER,  in the last 72 hours No results found for this basename: HGBA1C,  in the last 72 hours No results found for this basename: CHOL, HDL, LDLCALC, TRIG, CHOLHDL, LDLDIRECT,  in the last 72 hours No results found for this basename: TSH, T4TOTAL, FREET3, T3FREE, THYROIDAB,  in the last 72 hours No results found for this basename: VITAMINB12, FOLATE, FERRITIN, TIBC, IRON, RETICCTPCT,  in the last 72 hours Imaging results:  No results found.  [facility]: WBC 17.4, hemoglobin 11.7, hematocrit 34.3, platelet count 355, sodium 134, potassium 4.6, chloride 93, CO2 28.2, BUN 18, creatinine 1.9.  [facility]: Soft tissue gas/infection centered over the plantar surface of the fifth metatarsal joint.  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  Patient will need orthopedic surgery or vascular surgery evaluation in the morning for consideration of I&D +/- amputation.  Fever/leukocytosis/early sepsis/SIRS Due to above.  On broad-spectrum antibiotics.  Acute renal failure on chronic kidney disease stage III Creatinine 1.9 at [facility].  Hydrate the patient on IV fluids.  Reassess after hydration.  Likely due to fever with insensible losses and diarrhea.  Holding patient's losartan.  Diarrhea Likely due to antibiotics.  However check stool for C. difficile and stool culture.  Hypertension Continue home antihypertensive medications except losartan.  Hyperlipidemia Continue statin.  Type 2 diabetes uncontrolled with chronic kidney disease Continue Lantus.  Sliding scale insulin.  Coronary artery disease Continue aspirin and metoprolol.  Stable.  Prophylaxis Lovenox.  CODE STATUS Full code.  Disposition Admit the patient to medical bed as inpatient.  Time spent on admission, talking to the patient, and coordinating care was: 50 mins.  Ysabela Keisler A, MD 01/24/2014, 1:51 AM

## 2014-01-25 ENCOUNTER — Ambulatory Visit: Payer: Medicare HMO | Admitting: Cardiovascular Disease

## 2014-01-25 DIAGNOSIS — E08621 Diabetes mellitus due to underlying condition with foot ulcer: Secondary | ICD-10-CM

## 2014-01-25 DIAGNOSIS — I70262 Atherosclerosis of native arteries of extremities with gangrene, left leg: Secondary | ICD-10-CM

## 2014-01-25 DIAGNOSIS — I739 Peripheral vascular disease, unspecified: Secondary | ICD-10-CM

## 2014-01-25 DIAGNOSIS — N179 Acute kidney failure, unspecified: Secondary | ICD-10-CM

## 2014-01-25 DIAGNOSIS — L97529 Non-pressure chronic ulcer of other part of left foot with unspecified severity: Secondary | ICD-10-CM

## 2014-01-25 LAB — BASIC METABOLIC PANEL
Anion gap: 17 — ABNORMAL HIGH (ref 5–15)
BUN: 23 mg/dL (ref 6–23)
CO2: 21 mEq/L (ref 19–32)
Calcium: 8.6 mg/dL (ref 8.4–10.5)
Chloride: 98 mEq/L (ref 96–112)
Creatinine, Ser: 1.73 mg/dL — ABNORMAL HIGH (ref 0.50–1.35)
GFR calc Af Amer: 43 mL/min — ABNORMAL LOW (ref >90)
GFR calc non Af Amer: 37 mL/min — ABNORMAL LOW (ref >90)
Glucose, Bld: 251 mg/dL — ABNORMAL HIGH (ref 70–99)
Potassium: 4.1 mEq/L (ref 3.7–5.3)
Sodium: 136 mEq/L — ABNORMAL LOW (ref 137–147)

## 2014-01-25 LAB — GLUCOSE, CAPILLARY
Glucose-Capillary: 236 mg/dL — ABNORMAL HIGH (ref 70–99)
Glucose-Capillary: 239 mg/dL — ABNORMAL HIGH (ref 70–99)
Glucose-Capillary: 260 mg/dL — ABNORMAL HIGH (ref 70–99)
Glucose-Capillary: 230 mg/dL — ABNORMAL HIGH (ref 70–99)

## 2014-01-25 LAB — CBC
HCT: 30.4 % — ABNORMAL LOW (ref 39.0–52.0)
Hemoglobin: 10.1 g/dL — ABNORMAL LOW (ref 13.0–17.0)
MCH: 27.8 pg (ref 26.0–34.0)
MCHC: 33.2 g/dL (ref 30.0–36.0)
MCV: 83.7 fL (ref 78.0–100.0)
Platelets: 333 10*3/uL (ref 150–400)
RBC: 3.63 MIL/uL — ABNORMAL LOW (ref 4.22–5.81)
RDW: 14.3 % (ref 11.5–15.5)
WBC: 15.4 10*3/uL — ABNORMAL HIGH (ref 4.0–10.5)

## 2014-01-25 LAB — CLOSTRIDIUM DIFFICILE BY PCR: Toxigenic C. Difficile by PCR: NEGATIVE

## 2014-01-25 MED ORDER — INSULIN GLARGINE 100 UNIT/ML ~~LOC~~ SOLN
42.0000 [IU] | Freq: Every day | SUBCUTANEOUS | Status: DC
Start: 2014-01-25 — End: 2014-01-29
  Administered 2014-01-25 – 2014-01-28 (×4): 42 [IU] via SUBCUTANEOUS
  Filled 2014-01-25 (×4): qty 0.42

## 2014-01-25 MED ORDER — SODIUM CHLORIDE 0.9 % IV SOLN
INTRAVENOUS | Status: DC
Start: 2014-01-25 — End: 2014-01-27
  Administered 2014-01-25: 16:00:00 via INTRAVENOUS

## 2014-01-25 MED ORDER — METOPROLOL SUCCINATE ER 100 MG PO TB24
100.0000 mg | ORAL_TABLET | Freq: Two times a day (BID) | ORAL | Status: DC
  Administered 2014-01-25 – 2014-02-07 (×26): 100 mg via ORAL
  Filled 2014-01-25 (×29): qty 1

## 2014-01-25 NOTE — Progress Notes (Addendum)
[facility] Progress Note   Gillermo Poch RZN:[id number] DOB: 1941/01/11 DOA: 01/24/2014 PCP: Raelene Bott, MD  Brief narrative: Jordan [name] is a 73 y.o. male with DM2, HTN, obesity, PVD who presents with gangrene of the left foot. He does not have much sensation in the foot and the area is painless. He noticed it about 2 wks ago and was started on Keflex as outpt. This did not improve the infection and caused severe diarrhea. He began to have fevers and therefore presented to the ER.    Subjective: No complaints of pain. Diarrhea is improving.   Assessment/Plan: Principal Problem:   Gangrene left foot/ sepsis - PVD - have consulted ortho- ortho requesting vascular surgery eval- ABI pending - cont Zosyn and Vanc - leukocytosis mildly improved- still with low grade fever- blood cx negative  Active Problems:   Diabetes Mellitus 2, insulin requiring - cont Lantus and Novolog- - takes high dose Novolog at home (30 U) with meals- have not resumed - has poor PO intake therefore on only a resistant sliding scale- follow sugars  Diarrhea - due to antibiotics - c diff negative - follow for improvement    Essential hypertension - cont Imdur, Norvasc, Toprol    Coronary atherosclerosis Cont ASA    Peripheral vascular disease - cont ASA- vascular sx consulted- spoke with Dr Scot Dock    CKD (chronic kidney disease), stage III - Cr increased today- may be related to Vancomycin vs dehydration as sodium low as well - baseline appears to be 1.2-1.3 - will start slow IVF and follow B met   Code Status: Full code Family Communication: none Disposition Plan: to be determined DVT prophylaxis: Lovenox  Consultants: ortho  Procedures: none  Antibiotics: Anti-infectives   Start     Dose/Rate Route Frequency Ordered Stop   01/24/14 2200  vancomycin (VANCOCIN) 1,500 mg in sodium chloride 0.9 % 500 mL IVPB     1,500 mg 250 mL/hr over 120 Minutes Intravenous Every 24 hours  01/24/14 0232     01/24/14 0800  piperacillin-tazobactam (ZOSYN) IVPB 3.375 g     3.375 g 12.5 mL/hr over 240 Minutes Intravenous Every 8 hours 01/24/14 0232     01/24/14 0245  piperacillin-tazobactam (ZOSYN) IVPB 3.375 g     3.375 g 100 mL/hr over 30 Minutes Intravenous  Once 01/24/14 0232 01/24/14 0336         Objective: Filed Weights   01/24/14 0057 01/24/14 2139  Weight: 103.647 kg (228 lb 8 oz) 105 kg (231 lb 7.7 oz)    Intake/Output Summary (Last 24 hours) at 01/25/14 1406 Last data filed at 01/25/14 1040  Gross per 24 hour  Intake   1430 ml  Output      0 ml  Net   1430 ml     Vitals Filed Vitals:   01/24/14 1847 01/24/14 2139 01/25/14 0532 01/25/14 1000  BP: 127/50 115/51 135/59 104/38  Pulse: 93 61 90 75  Temp: 99.7 F (37.6 C) 99.9 F (37.7 C) 100.5 F (38.1 C) 98.7 F (37.1 C)  TempSrc: Oral Oral Oral Oral  Resp: _0 Height:  5' 8.9" (1.75 m)    Weight:  105 kg (231 lb 7.7 oz)    SpO2: 97% 95% 95% 96%    Exam: General: No acute respiratory distress Lungs: Clear to auscultation bilaterally without wheezes or crackles Cardiovascular: Regular rate and rhythm without murmur gallop or rub normal S1 and S2 Abdomen: Nontender, nondistended, soft, bowel sounds positive,  no rebound, no ascites, no appreciable mass Extremities: No significant cyanosis, clubbing, or edema bilateral lower extremities Skin: right foot - necrotic tissue on lateral aspect of foot, foul smelling  Data Reviewed: Basic Metabolic Panel:  Recent Labs Lab 01/24/14 0535 01/25/14 0528  NA 133* 136*  K 4.3 4.1  CL 95* 98  CO2 21 21  GLUCOSE 164* 251*  BUN 17 23  CREATININE 1.45* 1.73*  CALCIUM 8.9 8.6   Liver Function Tests:  Recent Labs Lab 01/24/14 0535  AST 33  ALT 27  ALKPHOS 96  BILITOT 0.8  PROT 8.0  ALBUMIN 2.4*   No results found for this basename: LIPASE, AMYLASE,  in the last 168 hours No results found for this basename: AMMONIA,  in the last 168  hours CBC:  Recent Labs Lab 01/24/14 0535 01/25/14 0528  WBC 16.2* 15.4*  HGB 10.4* 10.1*  HCT 31.7* 30.4*  MCV 82.6 83.7  PLT 325 333   Cardiac Enzymes: No results found for this basename: CKTOTAL, CKMB, CKMBINDEX, TROPONINI,  in the last 168 hours BNP (last 3 results) No results found for this basename: PROBNP,  in the last 8760 hours CBG:  Recent Labs Lab 01/24/14 1155 01/24/14 1731 01/24/14 2138 01/25/14 0747 01/25/14 1143  GLUCAP 231* 194* 340* 236* 239*    Recent Results (from the past 240 hour(s))  CULTURE, BLOOD (ROUTINE X 2)     Status: None   Collection Time    01/24/14  5:35 AM      Result Value Ref Range Status   Specimen Description BLOOD LEFT ARM   Final   Special Requests BOTTLES DRAWN AEROBIC ONLY 5CC   Final   Culture  Setup Time     Final   Value: 01/24/2014 09:47     Performed at [facility]   Culture     Final   Value:        BLOOD CULTURE RECEIVED NO GROWTH TO DATE CULTURE WILL BE HELD FOR 5 DAYS BEFORE ISSUING A FINAL NEGATIVE REPORT     Performed at [facility]   Report Status PENDING   Incomplete  CULTURE, BLOOD (ROUTINE X 2)     Status: None   Collection Time    01/24/14  5:45 AM      Result Value Ref Range Status   Specimen Description BLOOD LEFT ARM   Final   Special Requests BOTTLES DRAWN AEROBIC AND ANAEROBIC 5CC EACH   Final   Culture  Setup Time     Final   Value: 01/24/2014 09:47     Performed at [facility]   Culture     Final   Value:        BLOOD CULTURE RECEIVED NO GROWTH TO DATE CULTURE WILL BE HELD FOR 5 DAYS BEFORE ISSUING A FINAL NEGATIVE REPORT     Performed at [facility]   Report Status PENDING   Incomplete  CLOSTRIDIUM DIFFICILE BY PCR     Status: None   Collection Time    01/24/14  8:07 PM      Result Value Ref Range Status   C difficile by pcr NEGATIVE  NEGATIVE Final     Studies:  Recent x-ray studies have been reviewed in detail by the Attending Physician  Scheduled  Meds:  Scheduled Meds: . amLODipine  10 mg Oral Daily  . aspirin  81 mg Oral Daily  . enoxaparin (LOVENOX) injection  40 mg Subcutaneous Q24H  . feeding  supplement (GLUCERNA SHAKE)  237 mL Oral BID BM  . feeding supplement (PRO-STAT SUGAR FREE 64)  30 mL Oral BID BM  . insulin aspart  0-20 Units Subcutaneous TID WC  . insulin aspart  0-5 Units Subcutaneous QHS  . insulin glargine  42 Units Subcutaneous QHS  . isosorbide mononitrate  30 mg Oral Daily  . metoprolol succinate  100 mg Oral BID  . piperacillin-tazobactam (ZOSYN)  IV  3.375 g Intravenous Q8H  . rosuvastatin  40 mg Oral Daily  . vancomycin  1,500 mg Intravenous Q24H   Continuous Infusions:    Time spent on care of this patient: >45 min   [place], MD 01/25/2014, 2:06 PM  LOS: 1 day   [facility] Office  [phone] Pager - Text Page per [email or web address]  If 7PM-7AM, please contact night-coverage [email or web address]

## 2014-01-25 NOTE — Consult Note (Signed)
Hospital Consult  Agree with note below.  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 He has significant infrainguinal arterial occlusive disease. I reviewed his previous arteriogram which was performed by Dr. Kirke CorinArida in January 2015. This showed a mild left common iliac artery stenosis which I agree is not significant. There was no pressure gradient across this and he has a fairly normal left femoral pulse. He has a long segment occlusion of the superficial femoral artery with reconstitution of the above-knee popliteal artery and single vessel runoff via the anterior tibial artery. There is a tight stenosis at the origin of the anterior tibial artery and some moderate disease in the proximal anterior tibial artery.   I have discussed the case with Dr. Kirke CorinArida. He will review the previous films and evaluate the patient early tomorrow morning. The patient's arterial Doppler study is pending. On my exam he had a monophasic anterior tibial signal with the Doppler on the left and a barely audible posterior tibial signal. If the patient is a candidate for an endovascular approach then this would certainly be reasonable. If he is not, then he could potentially be a candidate for a femoral popliteal bypass or femoral anterior tibial bypass. I will obtain a vein mapping of the left greater saphenous vein as this could also potentially affect the decisions.   I will also discuss the patient with Dr. Lajoyce Cornersuda.  Waverly Ferrarihristopher Dickson, MD, FACS Beeper [phone]   Reason for Consult:  Gangrene left foot Referring Physician:  [facility] MRN #:  [id number]  History of Present Illness: This is a 73 y.o. male who presents with non healing wound to left foot.  He states that he went to the foot doctor ~ 3 weeks ago and underwent some debridement of calluses on his foot and this is when this wound developed.  He states that there  were several places, but this one did not heal.  His wife has been tending to the wound with silvadene.   He was started on Keflex ~ 2 weeks ago, but developed diarrhea.  On Sunday, he developed worsening fever and chills and presented to outside hospital and was then [facility].  He is now on Vanc/Zosyn.  Dr. Lajoyce Cornersuda was consulted and requested the pt be evaluated by [facility].  He did have an arteriogram by Dr. Kirke CorinArida January 2015, which revealed moderate left CIA stenosis with no significant pressure gradient, a long occlusion of the left SFA with reconstitution via collaterals from the profunda distally with one vessel runoff below the knee via the AT, which has an 80-99% ostial stenosis.  He has moderate right SFA disease with one vessel runoff below the knee with only patent peroneal artery.  He does have diabetes and he is on insulin & Metformin.  He is on a beta blocker and CCB for his HTN.  Past Medical History  Diagnosis Date  . CAD (coronary artery disease)     a. s/p IMI in past tx with POBA and cath 6 mos later with occluded RCA;  b. h/o Taxus DES to LAD and CFX;  c.  cath 1/10: LM ok, mLAD 40-50%, LAD stent ok, D2 tandem 60-70%, pCFX 30%, mAVCFX stent ok, pRCA 40%, RV 90%, RV marginal 90%, dRCA filled L-R collats tx medically  . Carotid stenosis     dopplers 5/11: 40-59% bilat  . DM2 (diabetes mellitus, type 2)   .  HTN (hypertension)   . HLD (hyperlipidemia)   . Diabetes mellitus    Past Surgical History  Procedure Laterality Date  . Cardiac catheterization      Allergies  Allergen Reactions  . Cilostazol Other (See Comments)    Feels like feet on fire    Prior to Admission medications   Medication Sig Start Date End Date Taking? Authorizing Provider  acetaminophen (TYLENOL) 500 MG tablet Take 1,000 mg by mouth 2 (two) times daily as needed (foot pain).   Yes Historical Provider, MD  amLODipine (NORVASC) 10 MG tablet Take 10 mg by mouth daily.   Yes Historical  Provider, MD  aspirin 81 MG chewable tablet Chew 81 mg by mouth daily.   Yes Historical Provider, MD  cephALEXin (KEFLEX) 500 MG capsule Take 500 mg by mouth 3 (three) times daily. Started on 10/12 - planned for 3 weeks of therapy   Yes Historical Provider, MD  insulin aspart (NOVOLOG) 100 UNIT/ML injection Inject 10-32 Units into the skin 3 (three) times daily with meals. Sliding scale and carb coverage   Yes Historical Provider, MD  insulin glargine (LANTUS) 100 UNIT/ML injection Inject 52 Units into the skin at bedtime.    Yes Historical Provider, MD  isosorbide mononitrate (IMDUR) 30 MG 24 hr tablet Take 30 mg by mouth daily.   Yes Historical Provider, MD  losartan (COZAAR) 50 MG tablet Take 50 mg by mouth daily.   Yes Historical Provider, MD  metformin (FORTAMET) 1000 MG (OSM) 24 hr tablet Take 1,000 mg by mouth 2 (two) times daily with a meal.    Yes Historical Provider, MD  metoprolol (TOPROL-XL) 100 MG 24 hr tablet Take 100 mg by mouth 2 (two) times daily.     Yes Historical Provider, MD  neomycin-bacitracin-polymyxin (NEOSPORIN) [phone] ointment Apply 1 application topically daily. Placed under toenail   Yes Historical Provider, MD  nitroGLYCERIN (NITROSTAT) 0.4 MG SL tablet Place 0.4 mg under the tongue every 5 (five) minutes as needed.     Yes Historical Provider, MD  rosuvastatin (CRESTOR) 40 MG tablet Take 40 mg by mouth every evening.   Yes Historical Provider, MD  silver sulfADIAZINE (SILVADENE) 1 % cream Apply 1 application topically daily. 01/10/14   Alvan Dame, DPM    History   Social History  . Marital Status: Married    Spouse Name: N/A    Number of Children: N/A  . Years of Education: N/A   Occupational History  . Not on file.   Social History Main Topics  . Smoking status: Former Games developer  . Smokeless tobacco: Not on file  . Alcohol Use: No  . Drug Use: No  . Sexual Activity: Not on file   Other Topics Concern  . Not on file   Social History Narrative    . No narrative on file     Family History  Problem Relation Age of Onset  . Diabetes Mother   . Diabetes Sister     ROS: [x]  Positive   [ ]  Negative   [ ]  All sytems reviewed and are negative  Cardiovascular: []  chest pain/pressure []  palpitations []  SOB lying flat []  DOE [x]  pain in legs while walking-walks 6-7 yards before pain in calves bilaterally  []  pain in legs at rest []  pain in legs at night [x]  non-healing ulcers []  hx of DVT []  swelling in legs  Pulmonary: []  productive cough []  asthma/wheezing []  home O2  Neurologic: []  weakness in []  arms []  legs []   numbness in []  arms []  legs []  hx of CVA []  mini stroke [x]  carotid artery stenosis [] difficulty speaking or slurred speech []  temporary loss of vision in one eye []  dizziness  Hematologic: []  hx of cancer []  bleeding problems []  problems with blood clotting easily  Endocrine:   [x]  diabetes []  thyroid disease  GI []  vomiting blood []  blood in stool  GU: [x]  CKD/renal failure []  HD--[]  M/W/F or []  T/T/S []  burning with urination []  blood in urine  Psychiatric: []  anxiety []  depression  Musculoskeletal: []  arthritis []  joint pain  Integumentary: []  rashes [x]  ulcers left foot  Constitutional: [x]  fever [x]  chills   Physical Examination  Filed Vitals:   01/25/14 1000  BP: 104/38  Pulse: 75  Temp: 98.7 F (37.1 C)  Resp: 16   Body mass index is 34.29 kg/(m^2).  General:  WDWN in NAD Gait: Not observed HENT: WNL, normocephalic Eyes: Pupils equal Pulmonary: normal non-labored breathing, without Rales, rhonchi,  wheezing Cardiac: regular, without  Murmurs, rubs or gallops; without carotid bruits Abdomen: soft, NT/ND, no masses Skin: without rashes, with ulcer right 5th metatarsal on plantar surface-full thickness Vascular Exam/Pulses:  Right Left  Radial 2+ (normal) 2+ (normal)  Ulnar 1+ (weak) 1+ (weak)  Femoral 2+ (normal) 2+ (normal)  Popliteal Unable to palpate   Unable to palpate   DP Unable to palpate +  doppler signal Unable to palpate Neg doppler signal  Peroneal Unable to palpate  + peroneal doppler signal  PT Unable to palpate  Unable to palpate    Extremities: with ischemic changes, with Gangrene , without cellulitis; with open wounds;  Musculoskeletal: no muscle wasting or atrophy  Neurologic: A&O X 3; Appropriate Affect ; SENSATION: normal; MOTOR FUNCTION:  moving all extremities equally. Speech is fluent/normal Psychiatric:  Normal affect and capable of making medical decisions Lymph:  Negative for inguinal lymphadenopathy bilaterally   CBC    Component Value Date/Time   WBC 15.4* 01/25/2014 0528   RBC 3.63* 01/25/2014 0528   HGB 10.1* 01/25/2014 0528   HCT 30.4* 01/25/2014 0528   PLT 333 01/25/2014 0528   MCV 83.7 01/25/2014 0528   MCH 27.8 01/25/2014 0528   MCHC 33.2 01/25/2014 0528   RDW 14.3 01/25/2014 0528   LYMPHSABS 1.7 04/02/2013 0950   MONOABS 0.6 04/02/2013 0950   EOSABS 0.3 04/02/2013 0950   BASOSABS 0.0 04/02/2013 0950    BMET    Component Value Date/Time   NA 136* 01/25/2014 0528   K 4.1 01/25/2014 0528   CL 98 01/25/2014 0528   CO2 21 01/25/2014 0528   GLUCOSE 251* 01/25/2014 0528   BUN 23 01/25/2014 0528   CREATININE 1.73* 01/25/2014 0528   CREATININE 1.27 12/19/2010 1702   CREATININE 1.27 12/19/2010 1702   CALCIUM 8.6 01/25/2014 0528   GFRNONAA 37* 01/25/2014 0528   GFRAA 43* 01/25/2014 0528    COAGS: Lab Results  Component Value Date   INR 1.33 01/24/2014   INR 1.01 04/07/2013   INR 1.1* 04/02/2013   Non-Invasive Vascular Imaging:    Arteriogram by Dr. Kirke CorinArida 04/07/13: Findings:  Abdominal aorta: Normal in size with no significant disease. No evidence of aneurysm.  Left renal artery: 20% ostial stenosis.  Right renal artery: Normal  Celiac artery: Patent  Superior mesenteric artery: Patent  Right common iliac artery: There is a small ulcerated plaque proximally. Distally there is a 20% stenosis at  the origin of the internal iliac artery  Right internal iliac artery: Patent  with mild ostial stenosis.  Right external iliac artery: Normal  Right common femoral artery: 20% disease distally  Right profunda femoral artery: 50% disease proximally with diffuse 50% disease in the midsegment.  Right superficial femoral artery: Normal in size with 50% diffuse mid stenosis.  Right popliteal artery: 40% mid stenosis  Right tibial peroneal trunk: 60% proximal stenosis  Right anterior tibial artery: Occluded proximally  Right peroneal artery: Patent with 50% proximal stenosis and 60-70% distal stenosis. This is the only patent vessel to the foot and provides collaterals to the arch.  Right posterior tibial artery: Occluded proximally  Left common iliac artery: There is 50% distal stenosis at the origin of the internal iliac artery. The peak systolic gradient was less than 10 mm mercury across the stenosis.  Left internal iliac artery: There is heavily calcified lesion of at the ostium causing 70% stenosis.  Left external iliac artery: Normal  Left common femoral artery: 20% proximal stenosis.  Left profunda femoral artery: Patent with diffuse 30% disease proximally. This provides extensive collaterals reconstitute the distal SFA  Left superficial femoral artery: Occluded proximally a few centimeters after its origin with reconstitution distally via collaterals from the profunda  Left popliteal artery: Diffuse 20-30% disease.  Left tibial peroneal trunk: 80% proximal disease and then the vessel is occluded.  Left anterior tibial artery: A detailed 90% ostial stenosis at its origin with diffuse 60% disease proximally. The vessel has no significant disease distally and is the only patent vessel to the foot. Conclusions:  1. Moderate left common iliac artery stenosis with no significant pressure gradient.  2. Long occlusion of the left SFA with reconstitution via collaterals from the profunda distally.  One-vessel runoff below the knee on the left side via the anterior tibial artery which has an 80-90% ostial stenosis.  3. Moderate right SFA disease with one-vessel runoff below the knee on the right side with only patent peroneal artery  ABI's are pending  Statin:  Yes.   Beta Blocker:  Yes.   Aspirin:  Yes ACEI:  No. ARB:  Yes.   Other antiplatelets/anticoagulants:  No.  ASSESSMENT/PLAN: This is a 73 y.o. 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    -Dr. Edilia Bo to review arteriogram done by Dr. Kirke Corin in January 2015 and will d/w pt with him.  He may need new arteriogram given this limb threatening situation. -If unable to do intervention, he may need a lower extremity bypass and in that case, would need vein mapping. -pt does have CKD III with worsening creatinine on this admission.  Receiving IVF as he may be dehydrated.   Would consider adjusting Lovenox dose for renal function.  He is on ARB at home, however this has not been restarted here in the hospital. -vanc/zosyn per pharmacy -continue statin  Doreatha Massed, PA-C [facility] [phone]

## 2014-01-25 NOTE — Progress Notes (Signed)
Temperature 101.6 orally.  BP 105/54.  No fever or chills per patient.  No dizziness.  Notified MD on call.  Tylenol 650 mg PRN given.  Rechecked temperature; now 99.1 orally.  BP now 113/49.  Will continue to monitor.

## 2014-01-26 ENCOUNTER — Encounter (HOSPITAL_COMMUNITY)
Admission: AD | Disposition: A | Discharge status: Skilled Nursing Facility | Payer: Medicare HMO | Source: Other Acute Inpatient Hospital | Attending: Internal Medicine

## 2014-01-26 DIAGNOSIS — Z01810 Encounter for preprocedural cardiovascular examination: Secondary | ICD-10-CM

## 2014-01-26 DIAGNOSIS — N183 Chronic kidney disease, stage 3 (moderate): Secondary | ICD-10-CM

## 2014-01-26 DIAGNOSIS — I70209 Unspecified atherosclerosis of native arteries of extremities, unspecified extremity: Secondary | ICD-10-CM

## 2014-01-26 DIAGNOSIS — I70213 Atherosclerosis of native arteries of extremities with intermittent claudication, bilateral legs: Secondary | ICD-10-CM

## 2014-01-26 DIAGNOSIS — D72829 Elevated white blood cell count, unspecified: Secondary | ICD-10-CM

## 2014-01-26 DIAGNOSIS — L97409 Non-pressure chronic ulcer of unspecified heel and midfoot with unspecified severity: Secondary | ICD-10-CM

## 2014-01-26 DIAGNOSIS — I10 Essential (primary) hypertension: Secondary | ICD-10-CM

## 2014-01-26 HISTORY — PX: ABDOMINAL AORTAGRAM: SHX5454

## 2014-01-26 HISTORY — PX: LOWER EXTREMITY ANGIOGRAM: SHX5508

## 2014-01-26 LAB — BASIC METABOLIC PANEL
Anion gap: 15 (ref 5–15)
BUN: 20 mg/dL (ref 6–23)
CO2: 23 mEq/L (ref 19–32)
Calcium: 8.8 mg/dL (ref 8.4–10.5)
Chloride: 99 mEq/L (ref 96–112)
Creatinine, Ser: 1.77 mg/dL — ABNORMAL HIGH (ref 0.50–1.35)
GFR calc Af Amer: 42 mL/min — ABNORMAL LOW (ref >90)
GFR calc non Af Amer: 36 mL/min — ABNORMAL LOW (ref >90)
Glucose, Bld: 150 mg/dL — ABNORMAL HIGH (ref 70–99)
Potassium: 3.9 mEq/L (ref 3.7–5.3)
Sodium: 137 mEq/L (ref 137–147)

## 2014-01-26 LAB — GLUCOSE, CAPILLARY
Glucose-Capillary: 151 mg/dL — ABNORMAL HIGH (ref 70–99)
Glucose-Capillary: 132 mg/dL — ABNORMAL HIGH (ref 70–99)
Glucose-Capillary: 121 mg/dL — ABNORMAL HIGH (ref 70–99)
Glucose-Capillary: 127 mg/dL — ABNORMAL HIGH (ref 70–99)
Glucose-Capillary: 185 mg/dL — ABNORMAL HIGH (ref 70–99)

## 2014-01-26 SURGERY — ANGIOGRAM, LOWER EXTREMITY
Anesthesia: LOCAL | Laterality: Left

## 2014-01-26 MED ORDER — FENTANYL CITRATE 0.05 MG/ML IJ SOLN
INTRAMUSCULAR | Status: AC
  Filled 2014-01-26: qty 2

## 2014-01-26 MED ORDER — ASPIRIN 81 MG PO CHEW: 81.0000 mg | CHEWABLE_TABLET | ORAL | Status: DC

## 2014-01-26 MED ORDER — SODIUM CHLORIDE 0.9 % IV SOLN
INTRAVENOUS | Status: DC
  Administered 2014-01-27: 07:00:00 via INTRAVENOUS

## 2014-01-26 MED ORDER — HEPARIN (PORCINE) IN NACL 2-0.9 UNIT/ML-% IJ SOLN
INTRAMUSCULAR | Status: AC
  Filled 2014-01-26: qty 500

## 2014-01-26 MED ORDER — LIDOCAINE HCL (PF) 1 % IJ SOLN
INTRAMUSCULAR | Status: AC
  Filled 2014-01-26: qty 30

## 2014-01-26 MED ORDER — MIDAZOLAM HCL 2 MG/2ML IJ SOLN
INTRAMUSCULAR | Status: AC
  Filled 2014-01-26: qty 2

## 2014-01-26 NOTE — Progress Notes (Signed)
VASCULAR LAB PRELIMINARY  ARTERIAL  ABI completed: Bilateral ABIs demonstrate severe arterial disease.    RIGHT    LEFT    PRESSURE WAVEFORM  PRESSURE WAVEFORM  BRACHIAL 136 Tri BRACHIAL 135 Tri  DP 58 Mono DP    AT Not audible at this level  AT 62 Damp mono  PT 55 Mono PT Not audible   PER   PER Not audible   GREAT TOE  NA GREAT TOE  NA    RIGHT LEFT  ABI 0.43 0.46     Farrel DemarkJill Eunice, RDMS, RVT  01/26/2014, 11:09 AM

## 2014-01-26 NOTE — Progress Notes (Signed)
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 He has significant infrainguinal arterial occlusive disease. I performed an arteriogram  in January 2015. This showed a mild left common iliac artery stenosis .He has a long segment occlusion of the superficial femoral artery with reconstitution of the above-knee popliteal artery and single vessel runoff via the anterior tibial artery. There is a tight stenosis at the origin of the anterior tibial artery and some moderate disease in the proximal anterior tibial artery.  He was treated medically for claudication at that time. However, he is now presenting with critical limb ischemia. Thus, revascularization is indicated.  I will proceed with an angiogram today and attempt endovascular intervention. If not successful to cross the occlusion, then will will require fem-pop or fem-tibial bypass.  Risks and benefits were discussed with the patient. He does have CKD. Continue IVFs. He does have a fever but he is already on antibiotics and blood cultures have been negative.

## 2014-01-26 NOTE — Progress Notes (Signed)
VASCULAR SURGERY:  This patient has an extensive nonhealing wound on the left foot. He has a left superficial femoral artery occlusion and severe tibial artery occlusive disease. As per Dr. Jari SportsmanArida's note, he was not a candidate for an endovascular revascularization. Therefore, I think his only chance for limb salvage would be a femoral to anterior tibial artery bypass. If the vein were not adequate and lengthy could potentially have a femoropopliteal bypass as an alternative. I think without revascularization the wound on the left foot has no chance of healing. However, even with revascularization, given the extent of the wound there is significant risk of limb loss. I have reviewed his vein mapping which shows reasonable size greater saphenous vein on the left. I will plan on revascularization on Friday and will discuss this with him tomorrow.  Jordan Ferrarihristopher Brysyn Brandenberger, MD, FACS Beeper [phone] 01/26/2014

## 2014-01-26 NOTE — H&P (View-Only) (Signed)
Hospital Consult  Agree with note below.  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 He has significant infrainguinal arterial occlusive disease. I reviewed his previous arteriogram which was performed by Dr. Kirke CorinArida in January 2015. This showed a mild left common iliac artery stenosis which I agree is not significant. There was no pressure gradient across this and he has a fairly normal left femoral pulse. He has a long segment occlusion of the superficial femoral artery with reconstitution of the above-knee popliteal artery and single vessel runoff via the anterior tibial artery. There is a tight stenosis at the origin of the anterior tibial artery and some moderate disease in the proximal anterior tibial artery.   I have discussed the case with Dr. Kirke CorinArida. He will review the previous films and evaluate the patient early tomorrow morning. The patient's arterial Doppler study is pending. On my exam he had a monophasic anterior tibial signal with the Doppler on the left and a barely audible posterior tibial signal. If the patient is a candidate for an endovascular approach then this would certainly be reasonable. If he is not, then he could potentially be a candidate for a femoral popliteal bypass or femoral anterior tibial bypass. I will obtain a vein mapping of the left greater saphenous vein as this could also potentially affect the decisions.   I will also discuss the patient with Dr. Lajoyce Cornersuda.  Waverly Ferrarihristopher Kayah Hecker, MD, FACS Beeper [phone]   Reason for Consult:  Gangrene left foot Referring Physician:  [facility] MRN #:  [id number]  History of Present Illness: This is a 73 y.o. male who presents with non healing wound to left foot.  He states that he went to the foot doctor ~ 3 weeks ago and underwent some debridement of calluses on his foot and this is when this wound developed.  He states that there  were several places, but this one did not heal.  His wife has been tending to the wound with silvadene.   He was started on Keflex ~ 2 weeks ago, but developed diarrhea.  On Sunday, he developed worsening fever and chills and presented to outside hospital and was then [facility].  He is now on Vanc/Zosyn.  Dr. Lajoyce Cornersuda was consulted and requested the pt be evaluated by [facility].  He did have an arteriogram by Dr. Kirke CorinArida January 2015, which revealed moderate left CIA stenosis with no significant pressure gradient, a long occlusion of the left SFA with reconstitution via collaterals from the profunda distally with one vessel runoff below the knee via the AT, which has an 80-99% ostial stenosis.  He has moderate right SFA disease with one vessel runoff below the knee with only patent peroneal artery.  He does have diabetes and he is on insulin & Metformin.  He is on a beta blocker and CCB for his HTN.  Past Medical History  Diagnosis Date  . CAD (coronary artery disease)     a. s/p IMI in past tx with POBA and cath 6 mos later with occluded RCA;  b. h/o Taxus DES to LAD and CFX;  c.  cath 1/10: LM ok, mLAD 40-50%, LAD stent ok, D2 tandem 60-70%, pCFX 30%, mAVCFX stent ok, pRCA 40%, RV 90%, RV marginal 90%, dRCA filled L-R collats tx medically  . Carotid stenosis     dopplers 5/11: 40-59% bilat  . DM2 (diabetes mellitus, type 2)   .  HTN (hypertension)   . HLD (hyperlipidemia)   . Diabetes mellitus    Past Surgical History  Procedure Laterality Date  . Cardiac catheterization      Allergies  Allergen Reactions  . Cilostazol Other (See Comments)    Feels like feet on fire    Prior to Admission medications   Medication Sig Start Date End Date Taking? Authorizing Provider  acetaminophen (TYLENOL) 500 MG tablet Take 1,000 mg by mouth 2 (two) times daily as needed (foot pain).   Yes Historical Provider, MD  amLODipine (NORVASC) 10 MG tablet Take 10 mg by mouth daily.   Yes Historical  Provider, MD  aspirin 81 MG chewable tablet Chew 81 mg by mouth daily.   Yes Historical Provider, MD  cephALEXin (KEFLEX) 500 MG capsule Take 500 mg by mouth 3 (three) times daily. Started on 10/12 - planned for 3 weeks of therapy   Yes Historical Provider, MD  insulin aspart (NOVOLOG) 100 UNIT/ML injection Inject 10-32 Units into the skin 3 (three) times daily with meals. Sliding scale and carb coverage   Yes Historical Provider, MD  insulin glargine (LANTUS) 100 UNIT/ML injection Inject 52 Units into the skin at bedtime.    Yes Historical Provider, MD  isosorbide mononitrate (IMDUR) 30 MG 24 hr tablet Take 30 mg by mouth daily.   Yes Historical Provider, MD  losartan (COZAAR) 50 MG tablet Take 50 mg by mouth daily.   Yes Historical Provider, MD  metformin (FORTAMET) 1000 MG (OSM) 24 hr tablet Take 1,000 mg by mouth 2 (two) times daily with a meal.    Yes Historical Provider, MD  metoprolol (TOPROL-XL) 100 MG 24 hr tablet Take 100 mg by mouth 2 (two) times daily.     Yes Historical Provider, MD  neomycin-bacitracin-polymyxin (NEOSPORIN) [phone] ointment Apply 1 application topically daily. Placed under toenail   Yes Historical Provider, MD  nitroGLYCERIN (NITROSTAT) 0.4 MG SL tablet Place 0.4 mg under the tongue every 5 (five) minutes as needed.     Yes Historical Provider, MD  rosuvastatin (CRESTOR) 40 MG tablet Take 40 mg by mouth every evening.   Yes Historical Provider, MD  silver sulfADIAZINE (SILVADENE) 1 % cream Apply 1 application topically daily. 01/10/14   Alvan Dame, DPM    History   Social History  . Marital Status: Married    Spouse Name: N/A    Number of Children: N/A  . Years of Education: N/A   Occupational History  . Not on file.   Social History Main Topics  . Smoking status: Former Games developer  . Smokeless tobacco: Not on file  . Alcohol Use: No  . Drug Use: No  . Sexual Activity: Not on file   Other Topics Concern  . Not on file   Social History Narrative    . No narrative on file     Family History  Problem Relation Age of Onset  . Diabetes Mother   . Diabetes Sister     ROS: [x]  Positive   [ ]  Negative   [ ]  All sytems reviewed and are negative  Cardiovascular: []  chest pain/pressure []  palpitations []  SOB lying flat []  DOE [x]  pain in legs while walking-walks 6-7 yards before pain in calves bilaterally  []  pain in legs at rest []  pain in legs at night [x]  non-healing ulcers []  hx of DVT []  swelling in legs  Pulmonary: []  productive cough []  asthma/wheezing []  home O2  Neurologic: []  weakness in []  arms []  legs []   numbness in []  arms []  legs []  hx of CVA []  mini stroke [x]  carotid artery stenosis [] difficulty speaking or slurred speech []  temporary loss of vision in one eye []  dizziness  Hematologic: []  hx of cancer []  bleeding problems []  problems with blood clotting easily  Endocrine:   [x]  diabetes []  thyroid disease  GI []  vomiting blood []  blood in stool  GU: [x]  CKD/renal failure []  HD--[]  M/W/F or []  T/T/S []  burning with urination []  blood in urine  Psychiatric: []  anxiety []  depression  Musculoskeletal: []  arthritis []  joint pain  Integumentary: []  rashes [x]  ulcers left foot  Constitutional: [x]  fever [x]  chills   Physical Examination  Filed Vitals:   01/25/14 1000  BP: 104/38  Pulse: 75  Temp: 98.7 F (37.1 C)  Resp: 16   Body mass index is 34.29 kg/(m^2).  General:  WDWN in NAD Gait: Not observed HENT: WNL, normocephalic Eyes: Pupils equal Pulmonary: normal non-labored breathing, without Rales, rhonchi,  wheezing Cardiac: regular, without  Murmurs, rubs or gallops; without carotid bruits Abdomen: soft, NT/ND, no masses Skin: without rashes, with ulcer right 5th metatarsal on plantar surface-full thickness Vascular Exam/Pulses:  Right Left  Radial 2+ (normal) 2+ (normal)  Ulnar 1+ (weak) 1+ (weak)  Femoral 2+ (normal) 2+ (normal)  Popliteal Unable to palpate   Unable to palpate   DP Unable to palpate +  doppler signal Unable to palpate Neg doppler signal  Peroneal Unable to palpate  + peroneal doppler signal  PT Unable to palpate  Unable to palpate    Extremities: with ischemic changes, with Gangrene , without cellulitis; with open wounds;  Musculoskeletal: no muscle wasting or atrophy  Neurologic: A&O X 3; Appropriate Affect ; SENSATION: normal; MOTOR FUNCTION:  moving all extremities equally. Speech is fluent/normal Psychiatric:  Normal affect and capable of making medical decisions Lymph:  Negative for inguinal lymphadenopathy bilaterally   CBC    Component Value Date/Time   WBC 15.4* 01/25/2014 0528   RBC 3.63* 01/25/2014 0528   HGB 10.1* 01/25/2014 0528   HCT 30.4* 01/25/2014 0528   PLT 333 01/25/2014 0528   MCV 83.7 01/25/2014 0528   MCH 27.8 01/25/2014 0528   MCHC 33.2 01/25/2014 0528   RDW 14.3 01/25/2014 0528   LYMPHSABS 1.7 04/02/2013 0950   MONOABS 0.6 04/02/2013 0950   EOSABS 0.3 04/02/2013 0950   BASOSABS 0.0 04/02/2013 0950    BMET    Component Value Date/Time   NA 136* 01/25/2014 0528   K 4.1 01/25/2014 0528   CL 98 01/25/2014 0528   CO2 21 01/25/2014 0528   GLUCOSE 251* 01/25/2014 0528   BUN 23 01/25/2014 0528   CREATININE 1.73* 01/25/2014 0528   CREATININE 1.27 12/19/2010 1702   CREATININE 1.27 12/19/2010 1702   CALCIUM 8.6 01/25/2014 0528   GFRNONAA 37* 01/25/2014 0528   GFRAA 43* 01/25/2014 0528    COAGS: Lab Results  Component Value Date   INR 1.33 01/24/2014   INR 1.01 04/07/2013   INR 1.1* 04/02/2013   Non-Invasive Vascular Imaging:    Arteriogram by Dr. Kirke CorinArida 04/07/13: Findings:  Abdominal aorta: Normal in size with no significant disease. No evidence of aneurysm.  Left renal artery: 20% ostial stenosis.  Right renal artery: Normal  Celiac artery: Patent  Superior mesenteric artery: Patent  Right common iliac artery: There is a small ulcerated plaque proximally. Distally there is a 20% stenosis at  the origin of the internal iliac artery  Right internal iliac artery: Patent  with mild ostial stenosis.  Right external iliac artery: Normal  Right common femoral artery: 20% disease distally  Right profunda femoral artery: 50% disease proximally with diffuse 50% disease in the midsegment.  Right superficial femoral artery: Normal in size with 50% diffuse mid stenosis.  Right popliteal artery: 40% mid stenosis  Right tibial peroneal trunk: 60% proximal stenosis  Right anterior tibial artery: Occluded proximally  Right peroneal artery: Patent with 50% proximal stenosis and 60-70% distal stenosis. This is the only patent vessel to the foot and provides collaterals to the arch.  Right posterior tibial artery: Occluded proximally  Left common iliac artery: There is 50% distal stenosis at the origin of the internal iliac artery. The peak systolic gradient was less than 10 mm mercury across the stenosis.  Left internal iliac artery: There is heavily calcified lesion of at the ostium causing 70% stenosis.  Left external iliac artery: Normal  Left common femoral artery: 20% proximal stenosis.  Left profunda femoral artery: Patent with diffuse 30% disease proximally. This provides extensive collaterals reconstitute the distal SFA  Left superficial femoral artery: Occluded proximally a few centimeters after its origin with reconstitution distally via collaterals from the profunda  Left popliteal artery: Diffuse 20-30% disease.  Left tibial peroneal trunk: 80% proximal disease and then the vessel is occluded.  Left anterior tibial artery: A detailed 90% ostial stenosis at its origin with diffuse 60% disease proximally. The vessel has no significant disease distally and is the only patent vessel to the foot. Conclusions:  1. Moderate left common iliac artery stenosis with no significant pressure gradient.  2. Long occlusion of the left SFA with reconstitution via collaterals from the profunda distally.  One-vessel runoff below the knee on the left side via the anterior tibial artery which has an 80-90% ostial stenosis.  3. Moderate right SFA disease with one-vessel runoff below the knee on the right side with only patent peroneal artery  ABI's are pending  Statin:  Yes.   Beta Blocker:  Yes.   Aspirin:  Yes ACEI:  No. ARB:  Yes.   Other antiplatelets/anticoagulants:  No.  ASSESSMENT/PLAN: This is a 73 y.o. 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    -Dr. Edilia Bo to review arteriogram done by Dr. Kirke Corin in January 2015 and will d/w pt with him.  He may need new arteriogram given this limb threatening situation. -If unable to do intervention, he may need a lower extremity bypass and in that case, would need vein mapping. -pt does have CKD III with worsening creatinine on this admission.  Receiving IVF as he may be dehydrated.   Would consider adjusting Lovenox dose for renal function.  He is on ARB at home, however this has not been restarted here in the hospital. -vanc/zosyn per pharmacy -continue statin  Doreatha Massed, PA-C [facility] [phone]

## 2014-01-26 NOTE — Progress Notes (Signed)
Left Lower Extremity Vein Map    Left Great Saphenous Vein   Segment Diameter Comment  1. Origin 5.712mm   2. High Thigh 4.135mm   3. Mid Thigh 4.6604mm   4. Low Thigh 4.4567mm   5. At Knee 4.1051mm   6. High Calf 4.7361mm   7. Low Calf 3.5261mm Branch  8. Ankle 2.7786mm                 Left Small Saphenous Vein  Segment Diameter Comment  1. Origin 1.4328mm   3. Low Calf 1.4419mm   4. Ankle  Unable to visualize.               01/26/2014 11:24 AM Gertie FeyMichelle Apolinar Bero, RVT, RDCS, RDMS

## 2014-01-26 NOTE — CV Procedure (Signed)
    PERIPHERAL VASCULAR PROCEDURE  NAME:  Jordan [name]   MRN: [id number] DOB:  [birth year]   ADMIT DATE: 01/24/2014  Performing Cardiologist: Lorine BearsMuhammad Arida Primary Physician: Lindwood QuaHOFFMAN,BYRON, MD   Procedures Performed:  Bilateral iliac angiography.   Second Order left Lower Extremity Angiography with Runoff   Indication(s):   Critical Limb Ischemia   Consent: The procedure with Risks/Benefits/Alternatives and Indications was reviewed with the patient .  All questions were answered.  Medications:  Sedation:  1 mg IV Versed, 50 mcg IV Fentanyl  Contrast:  58 ml  Visipaque   Procedural details: The right groin was prepped, draped, and anesthetized with 1% lidocaine. Using modified Seldinger technique, a 5 French sheath was introduced into the right common femoral artery. A 5 Fr Short Pigtail Catheter was advanced of over a  Versicore wire into the descending Aorta to a level just above the iliac arteries. A power injection of 1420ml/sec contrast over 1 sec was performed for iliac Angiography.    The pigtail catheter was changed over the Versicore wire for A crossover catheter which was then pulled back the aortic bifurcation and the wire was advanced down the contralateral common iliac artery.  The wire was then advanced to the contralateral common femoral artery, the catheter was exchanged into an end hole straight tip catheter which was advanced over the wire to the common femoral artery. Contralateral second-order lower extremity angiography was performed via power injection of 5 ml / sec contrast for a total of 35 ml.   The patient tolerated the procedure well with no immediate complications.     Hemodynamics:  Central Aortic Pressure / Mean Aortic Pressure: 126/55  Findings:  Abdominal aorta: The distal aorta is free of significant disease.  Right common iliac artery: A 20% stenosis.  Right internal iliac artery: Mild diffuse atherosclerosis.  Right external iliac  artery: Normal  Right common femoral artery: Normal  Left common iliac artery:  40% stenosis at the origin of the internal iliac artery  Left internal iliac artery: 80% ostial stenosis.  Left external iliac artery: Normal  Left common femoral artery: 30% proximal stenosis  Left profunda femoral artery: Diffuse 20% disease in the midsegment  Left superficial femoral artery:  Flush occlusion at the ostium with reconstitution distally via collaterals from the profunda  Left popliteal artery: Diffuse 20% disease.  Left tibial peroneal trunk: 80% proximal stenosis followed by occlusion at the origin of the posterior tibial and peroneal arteries  Left anterior tibial artery: 80% ostial stenosis followed by diffuse 60-70% proximal disease. This is the only patent vessel to the foot and reconstitutes the pedal arch via collaterals   Conclusions: Flush occlusion of the left SFA with reconstitution distally via collaterals from the profunda. One-vessel runoff below the knee with significant disease in the anterior tibial.  Recommendations:  Femoropopliteal bypass versus femoral tibial bypass. I will be discussing with Dr. Edilia Boickson.    Lorine BearsMuhammad Arida, MD, [facility] Center[facility] 01/26/2014 3:36 PM

## 2014-01-26 NOTE — Interval H&P Note (Signed)
History and Physical Interval Note:  01/26/2014 2:55 PM  Jordan [name]  has presented today for surgery, with the diagnosis of claudication  The various methods of treatment have been discussed with the patient and family. After consideration of risks, benefits and other options for treatment, the patient has consented to  Procedure(s): LOWER EXTREMITY ANGIOGRAM (Left) ABDOMINAL AORTAGRAM (N/A) as a surgical intervention .  The patient's history has been reviewed, patient examined, no change in status, stable for surgery.  I have reviewed the patient's chart and labs.  Questions were answered to the patient's satisfaction.     Lorine BearsMuhammad Arida

## 2014-01-26 NOTE — Progress Notes (Signed)
Site area: RFA Site Prior to Removal:  Level 0 Pressure Applied For:3720min Manual:   yes Patient Status During Pull:  stable Post Pull Site:  Level 0 Post Pull Instructions Given:  yes Post Pull Pulses Present: palpable Dressing Applied:  clear Bedrest begins @ 1610 Comments: no complications

## 2014-01-26 NOTE — Progress Notes (Signed)
[facility] PROGRESS NOTE Interim History: 73 y.o. male who presents with non healing wound to left foot. He states that he went to the foot doctor ~ 3 weeks ago and underwent some debridement of calluses on his foot and this is when this wound developed. He states that there were several places, but this one did not heal. His wife has been tending to the wound with silvadene. He was started on Keflex ~ 2 weeks ago.he developed worsening fever and chills and presented to outside hospital and was then transferred to [facility]. He is now on Vanc/Zosyn. Consult ortho who rec vascular surgery consult, who recommended cardiology Dr. Fletcher Anon who rec angiogram on 10.28.2015     Assessment/Plan: Gangrene left foot/ sepsis - PVD:  - have consulted ortho- ortho requesting vascular surgery, who requested her cardiology eval- ABI < 0.5 B/L - cont Zosyn and Vanc  - leukocytosis mildly improved- - has remained afebrile. - angiogram on 10.28.2015 for possible re vascularatization.  Diabetes Mellitus 2, insulin requiring  - cont Lantus and Novolog-  - BG cont to improve. - A1c 8.9 BG poorly controlled. Will need aggressive treatment when ready to go home  Diarrhea  - due to antibiotics - c diff negative - follow for improvement   Essential hypertension  - cont Imdur, Norvasc, Toprol   Coronary atherosclerosis  Cont ASA   Peripheral vascular disease  - cont ASA- vascular sx consulted. - see above for further details.  CKD (chronic kidney disease), stage III  - Cr increased today- may be related to Vancomycin vs dehydration as sodium low as well - baseline appears to be 1.2-1.3  - will start slow IVF and follow B met   Code Status: Full code  Family Communication: none  Disposition Plan: to be determined  DVT prophylaxis: Lovenox    Consultants:  Ortho  Vascular  cardio  Procedures:  Angiogram 10.287.2015  Antibiotics:  vanc zosyn  HPI/Subjective: No  compalins  Objective: Filed Vitals:   01/25/14 2213 01/26/14 0457 01/26/14 0834 01/26/14 1025  BP: 113/49 115/47    Pulse:  83    Temp: 99.1 F (37.3 C) 99.1 F (37.3 C)  99.2 F (37.3 C)  TempSrc: Oral   Oral  Resp:  18    Height:      Weight:   101.1 kg (222 lb 14.2 oz)   SpO2:  97%      Intake/Output Summary (Last 24 hours) at 01/26/14 1319 Last data filed at 01/26/14 0855  Gross per 24 hour  Intake 2282.5 ml  Output    200 ml  Net 2082.5 ml   Filed Weights   01/24/14 2139 01/25/14 2030 01/26/14 0834  Weight: 105 kg (231 lb 7.7 oz) 105 kg (231 lb 7.7 oz) 101.1 kg (222 lb 14.2 oz)    Exam:  General: Alert, awake, oriented x3, in no acute distress.  HEENT: No bruits, no goiter.  Heart: Regular rate and rhythm..  Lungs: Good air movement, clear Abdomen: Soft, nontender, nondistended, positive bowel sounds.     Data Reviewed: Basic Metabolic Panel:  Recent Labs Lab 01/24/14 0535 01/25/14 0528 01/26/14 0638  NA 133* 136* 137  K 4.3 4.1 3.9  CL 95* 98 99  CO2 _0 GLUCOSE 164* 251* 150*  BUN _1 CREATININE 1.45* 1.73* 1.77*  CALCIUM 8.9 8.6 8.8   Liver Function Tests:  Recent Labs Lab 01/24/14 0535  AST 33  ALT 27  ALKPHOS  96  BILITOT 0.8  PROT 8.0  ALBUMIN 2.4*   No results found for this basename: LIPASE, AMYLASE,  in the last 168 hours No results found for this basename: AMMONIA,  in the last 168 hours CBC:  Recent Labs Lab 01/24/14 0535 01/25/14 0528  WBC 16.2* 15.4*  HGB 10.4* 10.1*  HCT 31.7* 30.4*  MCV 82.6 83.7  PLT 325 333   Cardiac Enzymes: No results found for this basename: CKTOTAL, CKMB, CKMBINDEX, TROPONINI,  in the last 168 hours BNP (last 3 results) No results found for this basename: PROBNP,  in the last 8760 hours CBG:  Recent Labs Lab 01/25/14 1143 01/25/14 1709 01/25/14 2031 01/26/14 0759 01/26/14 1157  GLUCAP 239* 260* 230* 151* 132*    Recent Results (from the past 240 hour(s))  CULTURE,  BLOOD (ROUTINE X 2)     Status: None   Collection Time    01/24/14  5:35 AM      Result Value Ref Range Status   Specimen Description BLOOD LEFT ARM   Final   Special Requests BOTTLES DRAWN AEROBIC ONLY 5CC   Final   Culture  Setup Time     Final   Value: 01/24/2014 09:47     Performed at [facility]   Culture     Final   Value:        BLOOD CULTURE RECEIVED NO GROWTH TO DATE CULTURE WILL BE HELD FOR 5 DAYS BEFORE ISSUING A FINAL NEGATIVE REPORT     Performed at [facility]   Report Status PENDING   Incomplete  CULTURE, BLOOD (ROUTINE X 2)     Status: None   Collection Time    01/24/14  5:45 AM      Result Value Ref Range Status   Specimen Description BLOOD LEFT ARM   Final   Special Requests BOTTLES DRAWN AEROBIC AND ANAEROBIC 5CC EACH   Final   Culture  Setup Time     Final   Value: 01/24/2014 09:47     Performed at [facility]   Culture     Final   Value:        BLOOD CULTURE RECEIVED NO GROWTH TO DATE CULTURE WILL BE HELD FOR 5 DAYS BEFORE ISSUING A FINAL NEGATIVE REPORT     Performed at [facility]   Report Status PENDING   Incomplete  CLOSTRIDIUM DIFFICILE BY PCR     Status: None   Collection Time    01/24/14  8:07 PM      Result Value Ref Range Status   C difficile by pcr NEGATIVE  NEGATIVE Final  STOOL CULTURE     Status: None   Collection Time    01/24/14  8:07 PM      Result Value Ref Range Status   Specimen Description STOOL   Final   Special Requests NONE   Final   Culture     Final   Value: NO SUSPICIOUS COLONIES, CONTINUING TO HOLD     Performed at [facility]   Report Status PENDING   Incomplete     Studies: Mr Foot Left Wo Contrast  01/25/2014   CLINICAL DATA:  Initial encounter. Diabetic foot ulcer. Foot infection. Gas in the foot compatible with soft tissue infection. Evaluate osteomyelitis.  EXAM: MRI OF THE LEFT FOREFOOT WITHOUT CONTRAST  TECHNIQUE: Multiplanar, multisequence MR imaging was performed. No  intravenous contrast was administered.  COMPARISON:  None.  FINDINGS: The study is moderately  degraded by motion artifact. There is ulceration over the lateral aspect of the fifth MTP joint. Gas is present in the soft tissues producing susceptibility artifact. Despite the proximity of the ulceration to the fifth metatarsal head, there are no findings of osteomyelitis. No cortical osteolysis, effacement of fatty marrow or bone marrow edema in the fifth metatarsal head or proximal phalanx of the small toe. The other metatarsals also appear normal. First MTP joint osteoarthritis noted. Diffuse edema of the forefoot compatible with cellulitis.  IMPRESSION: 1. Negative for osteomyelitis or abscess. 2. Forefoot edema compatible with cellulitis. 3. Ulceration over the lateral forefoot with gas in the soft tissues.   Electronically Signed   By: Dereck Ligas M.D.   On: 01/25/2014 07:54    Scheduled Meds: . amLODipine  10 mg Oral Daily  . aspirin  81 mg Oral Daily  . [START ON 01/27/2014] aspirin  81 mg Oral Pre-Cath  . enoxaparin (LOVENOX) injection  40 mg Subcutaneous Q24H  . feeding supplement (GLUCERNA SHAKE)  237 mL Oral BID BM  . feeding supplement (PRO-STAT SUGAR FREE 64)  30 mL Oral BID BM  . insulin aspart  0-20 Units Subcutaneous TID WC  . insulin aspart  0-5 Units Subcutaneous QHS  . insulin glargine  42 Units Subcutaneous QHS  . isosorbide mononitrate  30 mg Oral Daily  . metoprolol succinate  100 mg Oral BID  . piperacillin-tazobactam (ZOSYN)  IV  3.375 g Intravenous Q8H  . rosuvastatin  40 mg Oral Daily  . vancomycin  1,500 mg Intravenous Q24H   Continuous Infusions: . sodium chloride 75 mL/hr at 01/25/14 [place], Jordan [name]  [facility] Pager [phone]. If 8PM-8AM, please contact night-coverage at [email or web address], password [facility] 01/26/2014, 1:19 PM  LOS: 2 days

## 2014-01-27 DIAGNOSIS — R5081 Fever presenting with conditions classified elsewhere: Secondary | ICD-10-CM

## 2014-01-27 DIAGNOSIS — E11621 Type 2 diabetes mellitus with foot ulcer: Secondary | ICD-10-CM

## 2014-01-27 LAB — BASIC METABOLIC PANEL
Anion gap: 17 — ABNORMAL HIGH (ref 5–15)
BUN: 19 mg/dL (ref 6–23)
CO2: 22 mEq/L (ref 19–32)
Calcium: 9.1 mg/dL (ref 8.4–10.5)
Chloride: 98 mEq/L (ref 96–112)
Creatinine, Ser: 1.79 mg/dL — ABNORMAL HIGH (ref 0.50–1.35)
GFR calc Af Amer: 42 mL/min — ABNORMAL LOW (ref >90)
GFR calc non Af Amer: 36 mL/min — ABNORMAL LOW (ref >90)
Glucose, Bld: 156 mg/dL — ABNORMAL HIGH (ref 70–99)
Potassium: 3.9 mEq/L (ref 3.7–5.3)
Sodium: 137 mEq/L (ref 137–147)

## 2014-01-27 LAB — GLUCOSE, CAPILLARY
Glucose-Capillary: 165 mg/dL — ABNORMAL HIGH (ref 70–99)
Glucose-Capillary: 148 mg/dL — ABNORMAL HIGH (ref 70–99)
Glucose-Capillary: 208 mg/dL — ABNORMAL HIGH (ref 70–99)
Glucose-Capillary: 214 mg/dL — ABNORMAL HIGH (ref 70–99)

## 2014-01-27 LAB — VANCOMYCIN, TROUGH: Vancomycin Tr: 15.4 ug/mL (ref 10.0–20.0)

## 2014-01-27 MED ORDER — SODIUM CHLORIDE 0.9 % IV SOLN: INTRAVENOUS | Status: AC

## 2014-01-27 NOTE — Progress Notes (Signed)
Temperature 102.5, MD notified.  Will continue to monitor.

## 2014-01-27 NOTE — Progress Notes (Signed)
   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 Without revascularization, he will require amputation. As per Dr. Jari SportsmanArida's note, he was not a candidate for an endovascular revascularization. Therefore, I think his only chance for limb salvage would be a femoral to anterior tibial artery bypass. If the vein were not adequate in length, he could potentially have a femoropopliteal bypass as an alternative. However, even with revascularization, given the extent of the wound there is significant risk of limb loss. I have reviewed his vein mapping which shows reasonable size greater saphenous vein on the left. I have reviewed the indications for lower extremity bypass. I have also reviewed the potential complications of surgery including but not limited to: wound healing problems, infection, graft thrombosis, limb loss, or other unpredictable medical problems. All the patient's questions were answered and they are agreeable to proceed. His surgery is scheduled for tomorrow.  SUBJECTIVE: No specific complaints.  PHYSICAL EXAM: Filed Vitals:   01/26/14 1610 01/26/14 1700 01/26/14 2129 01/27/14 0639  BP: 141/52 135/60 126/62 163/64  Pulse: 79 81 78 101  Temp:  98.2 F (36.8 C) 98.4 F (36.9 C) 100.4 F (38 C)  TempSrc:  Oral Oral Oral  Resp: 16 18 18 17   Height:      Weight:      SpO2: 100% 100% 95% 100%   No change in exam.  LABS: Lab Results  Component Value Date   WBC 15.4* 01/25/2014   HGB 10.1* 01/25/2014   HCT 30.4* 01/25/2014   MCV 83.7 01/25/2014   PLT 333 01/25/2014   Lab Results  Component Value Date   CREATININE 1.79* 01/27/2014   Lab Results  Component Value Date   INR 1.33 01/24/2014   CBG (last 3)   Recent Labs  01/26/14 1549 01/26/14 1700 01/26/14 2131  GLUCAP 121* 127* 185*    Principal Problem:   Diabetic foot ulcer Active Problems:   Diabetes  Hyperlipidemia   Essential hypertension   Coronary atherosclerosis   Cerebrovascular disease   Peripheral vascular disease   PAD (peripheral artery disease)   Diabetic osteomyelitis   Acute renal failure   CKD (chronic kidney disease), stage III   Leukocytosis   Fever presenting with conditions classified elsewhere   Diarrhea   Gangrene   Cari CarawayChris Lydiann Bonifas Beeper: [phone] 01/27/2014

## 2014-01-27 NOTE — Progress Notes (Signed)
Spoke with pt and his wife this morning.  Explained to them that Dr. Lajoyce Cornersuda will be out of town tomorrow and Dr. Edilia Boickson with perform the left 5th toe amputation.  Depending on the extent of the infection, he may need additional toes amputated or possibly a transmetatarsal amputation.  Pt and his wife understand and agree to proceed.  Area of gangrene at 5th metatarsal on plantar aspect of foot is black/soft.   Jordan [name] 01/27/2014 10:59 AM

## 2014-01-27 NOTE — Progress Notes (Signed)
[facility] PROGRESS NOTE Interim History: 73 y.o. male who presents with non healing wound to left foot. He states that he went to the foot doctor ~ 3 weeks ago and underwent some debridement of calluses on his foot and this is when this wound developed. He states that there were several places, but this one did not heal. His wife has been tending to the wound with silvadene. He was started on Keflex ~ 2 weeks ago.he developed worsening fever and chills and presented to outside hospital and was then transferred to [facility]. He is now on Vanc/Zosyn. Consult ortho who rec vascular surgery consult, who recommended cardiology Dr. Fletcher Anon who rec angiogram on 10.28.2015     Assessment/Plan: Gangrene left foot/ sepsis - PVD:  - Have consulted cardiology eval- ABI < 0.5 B/L - Cont Zosyn and Vanc  - Leukocytosis mildly improved - Has remained afebrile. - Angiogram on 10.28.2015 showed Flush occlusion of the left SFA  cardiology recommendedFemoropopliteal bypass versus femoral tibial bypass.  Diabetes Mellitus 2, insulin requiring  - cont Lantus and Novolog-  - BG cont to improve. - A1c 8.9 BG poorly controlled. Will need aggressive treatment when ready to go home  Diarrhea  - due to antibiotics - c diff negative - follow for improvement   Essential hypertension  - cont Imdur, Norvasc, Toprol   Coronary atherosclerosis  Cont ASA   Peripheral vascular disease  - cont ASA- vascular sx consulted. - see above for further details.  CKD (chronic kidney disease), stage III  - Cr increased today- may be related to Vancomycin vs dehydration as sodium low as well - baseline appears to be 1.2-1.3  - will start slow IVF and follow B met   Code Status: Full code  Family Communication: none  Disposition Plan: to be determined  DVT prophylaxis: Lovenox    Consultants:  Ortho  Vascular  cardio  Procedures:  Angiogram 10.287.2015  Antibiotics:  vanc  zosyn  HPI/Subjective: No compalins  Objective: Filed Vitals:   01/26/14 2129 01/27/14 0639 01/27/14 0730 01/27/14 1000  BP: 126/62 163/64  130/66  Pulse: 78 101  96  Temp: 98.4 F (36.9 C) 100.4 F (38 C) 102.5 F (39.2 C) 98.7 F (37.1 C)  TempSrc: Oral Oral Oral Oral  Resp: _0 Height:      Weight:      SpO2: 95% 100%  94%    Intake/Output Summary (Last 24 hours) at 01/27/14 1232 Last data filed at 01/27/14 0900  Gross per 24 hour  Intake 1622.5 ml  Output    400 ml  Net 1222.5 ml   Filed Weights   01/24/14 2139 01/25/14 2030 01/26/14 0834  Weight: 105 kg (231 lb 7.7 oz) 105 kg (231 lb 7.7 oz) 101.1 kg (222 lb 14.2 oz)    Exam:  General: Alert, awake, oriented x3, in no acute distress.  HEENT: No bruits, no goiter.  Heart: Regular rate and rhythm. Lungs: Good air movement, clear Abdomen: Soft, nontender, nondistended, positive bowel sounds.     Data Reviewed: Basic Metabolic Panel:  Recent Labs Lab 01/24/14 0535 01/25/14 0528 01/26/14 0638 01/27/14 0602  NA 133* 136* 137 137  K 4.3 4.1 3.9 3.9  CL 95* 98 99 98  CO2 _1 GLUCOSE 164* 251* 150* 156*  BUN _2 CREATININE 1.45* 1.73* 1.77* 1.79*  CALCIUM 8.9 8.6 8.8 9.1   Liver Function Tests:  Recent Labs Lab  01/24/14 0535  AST 33  ALT 27  ALKPHOS 96  BILITOT 0.8  PROT 8.0  ALBUMIN 2.4*   No results found for this basename: LIPASE, AMYLASE,  in the last 168 hours No results found for this basename: AMMONIA,  in the last 168 hours CBC:  Recent Labs Lab 01/24/14 0535 01/25/14 0528  WBC 16.2* 15.4*  HGB 10.4* 10.1*  HCT 31.7* 30.4*  MCV 82.6 83.7  PLT 325 333   Cardiac Enzymes: No results found for this basename: CKTOTAL, CKMB, CKMBINDEX, TROPONINI,  in the last 168 hours BNP (last 3 results) No results found for this basename: PROBNP,  in the last 8760 hours CBG:  Recent Labs Lab 01/26/14 1157 01/26/14 1549 01/26/14 1700 01/26/14 2131  01/27/14 0741  GLUCAP 132* 121* 127* 185* 165*    Recent Results (from the past 240 hour(s))  CULTURE, BLOOD (ROUTINE X 2)     Status: None   Collection Time    01/24/14  5:35 AM      Result Value Ref Range Status   Specimen Description BLOOD LEFT ARM   Final   Special Requests BOTTLES DRAWN AEROBIC ONLY 5CC   Final   Culture  Setup Time     Final   Value: 01/24/2014 09:47     Performed at [facility]   Culture     Final   Value:        BLOOD CULTURE RECEIVED NO GROWTH TO DATE CULTURE WILL BE HELD FOR 5 DAYS BEFORE ISSUING A FINAL NEGATIVE REPORT     Performed at [facility]   Report Status PENDING   Incomplete  CULTURE, BLOOD (ROUTINE X 2)     Status: None   Collection Time    01/24/14  5:45 AM      Result Value Ref Range Status   Specimen Description BLOOD LEFT ARM   Final   Special Requests BOTTLES DRAWN AEROBIC AND ANAEROBIC 5CC EACH   Final   Culture  Setup Time     Final   Value: 01/24/2014 09:47     Performed at [facility]   Culture     Final   Value:        BLOOD CULTURE RECEIVED NO GROWTH TO DATE CULTURE WILL BE HELD FOR 5 DAYS BEFORE ISSUING A FINAL NEGATIVE REPORT     Performed at [facility]   Report Status PENDING   Incomplete  CLOSTRIDIUM DIFFICILE BY PCR     Status: None   Collection Time    01/24/14  8:07 PM      Result Value Ref Range Status   C difficile by pcr NEGATIVE  NEGATIVE Final  STOOL CULTURE     Status: None   Collection Time    01/24/14  8:07 PM      Result Value Ref Range Status   Specimen Description STOOL   Final   Special Requests NONE   Final   Culture     Final   Value: NO SUSPICIOUS COLONIES, CONTINUING TO HOLD     Performed at [facility]   Report Status PENDING   Incomplete     Studies: No results found.  Scheduled Meds: . amLODipine  10 mg Oral Daily  . aspirin  81 mg Oral Daily  . enoxaparin (LOVENOX) injection  40 mg Subcutaneous Q24H  . feeding supplement (GLUCERNA SHAKE)   237 mL Oral BID BM  . feeding supplement (PRO-STAT SUGAR FREE 64)  30 mL Oral BID BM  . insulin aspart  0-20 Units Subcutaneous TID WC  . insulin aspart  0-5 Units Subcutaneous QHS  . insulin glargine  42 Units Subcutaneous QHS  . isosorbide mononitrate  30 mg Oral Daily  . metoprolol succinate  100 mg Oral BID  . piperacillin-tazobactam (ZOSYN)  IV  3.375 g Intravenous Q8H  . rosuvastatin  40 mg Oral Daily  . vancomycin  1,500 mg Intravenous Q24H   Continuous Infusions: . sodium chloride 75 mL/hr at 01/25/14 1546  . sodium chloride 75 mL/hr at 01/27/14 [place], Weber Monnier  [facility] Pager [phone]. If 8PM-8AM, please contact night-coverage at [email or web address], password [facility] 01/27/2014, 12:32 PM  LOS: 3 days

## 2014-01-27 NOTE — Progress Notes (Signed)
ANTIBIOTIC CONSULT NOTE - INITIAL  Pharmacy Consult for Vancocin and Zosyn Indication: cellulitis/diabetic ulcer, r/o osteo  Allergies  Allergen Reactions  . Cilostazol Other (See Comments)    Feels like feet on fire    Patient Measurements: Height: 5' 8.9" (175 cm) Weight: 225 lb 9.9 oz (102.34 kg) IBW/kg (Calculated) : 70.47  Vital Signs: Temp: 100.1 F (37.8 C) (10/29 2124) Temp Source: Oral (10/29 2124) BP: 158/74 mmHg (10/29 2124) Pulse Rate: 65 (10/29 2124)   Medical History: Past Medical History  Diagnosis Date  . CAD (coronary artery disease)     a. s/p IMI in past tx with POBA and cath 6 mos later with occluded RCA;  b. h/o Taxus DES to LAD and CFX;  c.  cath 1/10: LM ok, mLAD 40-50%, LAD stent ok, D2 tandem 60-70%, pCFX 30%, mAVCFX stent ok, pRCA 40%, RV 90%, RV marginal 90%, dRCA filled L-R collats tx medically  . Carotid stenosis     dopplers 5/11: 40-59% bilat  . DM2 (diabetes mellitus, type 2)   . HTN (hypertension)   . HLD (hyperlipidemia)   . Diabetes mellitus     Medications:  Prescriptions prior to admission  Medication Sig Dispense Refill  . acetaminophen (TYLENOL) 500 MG tablet Take 1,000 mg by mouth 2 (two) times daily as needed (foot pain).      [name]. amLODipine (NORVASC) 10 MG tablet Take 10 mg by mouth daily.      [name]. aspirin 81 MG chewable tablet Chew 81 mg by mouth daily.      . cephALEXin (KEFLEX) 500 MG capsule Take 500 mg by mouth 3 (three) times daily. Started on 10/12 - planned for 3 weeks of therapy      . insulin aspart (NOVOLOG) 100 UNIT/ML injection Inject 10-32 Units into the skin 3 (three) times daily with meals. Sliding scale and carb coverage      . insulin glargine (LANTUS) 100 UNIT/ML injection Inject 52 Units into the skin at bedtime.       . isosorbide mononitrate (IMDUR) 30 MG 24 hr tablet Take 30 mg by mouth daily.      [name]. losartan (COZAAR) 50 MG tablet Take 50 mg by mouth daily.      . metformin (FORTAMET) 1000 MG (OSM) 24 hr  tablet Take 1,000 mg by mouth 2 (two) times daily with a meal.       . metoprolol (TOPROL-XL) 100 MG 24 hr tablet Take 100 mg by mouth 2 (two) times daily.        [name]. neomycin-bacitracin-polymyxin (NEOSPORIN) [phone] ointment Apply 1 application topically daily. Placed under toenail      . nitroGLYCERIN (NITROSTAT) 0.4 MG SL tablet Place 0.4 mg under the tongue every 5 (five) minutes as needed.        . rosuvastatin (CRESTOR) 40 MG tablet Take 40 mg by mouth every evening.      . silver sulfADIAZINE (SILVADENE) 1 % cream Apply 1 application topically daily.  50 g  2   Scheduled:  . amLODipine  10 mg Oral Daily  . aspirin  81 mg Oral Daily  . enoxaparin (LOVENOX) injection  40 mg Subcutaneous Q24H  . feeding supplement (GLUCERNA SHAKE)  237 mL Oral BID BM  . feeding supplement (PRO-STAT SUGAR FREE 64)  30 mL Oral BID BM  . insulin aspart  0-20 Units Subcutaneous TID WC  . insulin aspart  0-5 Units Subcutaneous QHS  . insulin glargine  42 Units Subcutaneous QHS  .  isosorbide mononitrate  30 mg Oral Daily  . metoprolol succinate  100 mg Oral BID  . piperacillin-tazobactam (ZOSYN)  IV  3.375 g Intravenous Q8H  . rosuvastatin  40 mg Oral Daily  . vancomycin  1,500 mg Intravenous Q24H   Infusions:     Assessment: Jordan [name] w/ DM and PVD c/o diabetic foot ulcer x2wk w/ diarrhea starting after began Keflex and w/o proper healing of ulcer, now w/ fevers and chills, Xray at OSH showed soft-tissue gas/infection, [facility] to begin IV ABX.  Labs from OSH: WBC 17.4, Hgb 11.7, Plt 355, K 4.6, SCr 1.9 (baseline closer to 1.3)  Vancomycin trough was drawn a little early and came back therapeutic at 15.4. sCr improved, but is slowly creeping back up over last two days. Pt remains febrile with Tmax of 102.5 today, last WBC was 15.4 on 10/27.  Goal of Therapy:  Vancomycin trough level 15-20 mcg/ml (will decrease if osteo r/o)  Plan:  -Continue vancomycin 1500mg  IV q24h -Monitor renal function  closely -Monitor CBC, Cx, levels prn.  Arlean Hoppingorey M. Newman PiesBall, PharmD Clinical Pharmacist Pager [phone]  01/27/2014,9:35 PM

## 2014-01-28 ENCOUNTER — Inpatient Hospital Stay (HOSPITAL_COMMUNITY): Payer: Medicare PPO | Admitting: Anesthesiology

## 2014-01-28 ENCOUNTER — Encounter (HOSPITAL_COMMUNITY)
Admission: AD | Disposition: A | Discharge status: Skilled Nursing Facility | Payer: Self-pay | Source: Other Acute Inpatient Hospital | Attending: Internal Medicine

## 2014-01-28 ENCOUNTER — Encounter (HOSPITAL_COMMUNITY): Payer: Medicare PPO | Admitting: Anesthesiology

## 2014-01-28 ENCOUNTER — Encounter (HOSPITAL_COMMUNITY): Payer: Self-pay | Admitting: Anesthesiology

## 2014-01-28 DIAGNOSIS — M9080 Osteopathy in diseases classified elsewhere, unspecified site: Secondary | ICD-10-CM

## 2014-01-28 DIAGNOSIS — E1169 Type 2 diabetes mellitus with other specified complication: Secondary | ICD-10-CM

## 2014-01-28 DIAGNOSIS — M869 Osteomyelitis, unspecified: Secondary | ICD-10-CM

## 2014-01-28 HISTORY — PX: AMPUTATION: SHX166

## 2014-01-28 HISTORY — PX: FEMORAL-TIBIAL BYPASS GRAFT: SHX938

## 2014-01-28 LAB — CBC
HCT: 29.7 % — ABNORMAL LOW (ref 39.0–52.0)
Hemoglobin: 9.8 g/dL — ABNORMAL LOW (ref 13.0–17.0)
MCH: 27.1 pg (ref 26.0–34.0)
MCHC: 33 g/dL (ref 30.0–36.0)
MCV: 82 fL (ref 78.0–100.0)
Platelets: 399 10*3/uL (ref 150–400)
RBC: 3.62 MIL/uL — ABNORMAL LOW (ref 4.22–5.81)
RDW: 14.5 % (ref 11.5–15.5)
WBC: 19.4 10*3/uL — ABNORMAL HIGH (ref 4.0–10.5)

## 2014-01-28 LAB — GLUCOSE, CAPILLARY
Glucose-Capillary: 163 mg/dL — ABNORMAL HIGH (ref 70–99)
Glucose-Capillary: 201 mg/dL — ABNORMAL HIGH (ref 70–99)
Glucose-Capillary: 229 mg/dL — ABNORMAL HIGH (ref 70–99)
Glucose-Capillary: 234 mg/dL — ABNORMAL HIGH (ref 70–99)

## 2014-01-28 LAB — BASIC METABOLIC PANEL
Anion gap: 14 (ref 5–15)
BUN: 15 mg/dL (ref 6–23)
CO2: 23 mEq/L (ref 19–32)
Calcium: 8.6 mg/dL (ref 8.4–10.5)
Chloride: 103 mEq/L (ref 96–112)
Creatinine, Ser: 1.69 mg/dL — ABNORMAL HIGH (ref 0.50–1.35)
GFR calc Af Amer: 45 mL/min — ABNORMAL LOW (ref >90)
GFR calc non Af Amer: 38 mL/min — ABNORMAL LOW (ref >90)
Glucose, Bld: 180 mg/dL — ABNORMAL HIGH (ref 70–99)
Potassium: 4.1 mEq/L (ref 3.7–5.3)
Sodium: 140 mEq/L (ref 137–147)

## 2014-01-28 LAB — POCT I-STAT 4, (NA,K, GLUC, HGB,HCT)
Glucose, Bld: 202 mg/dL — ABNORMAL HIGH (ref 70–99)
Glucose, Bld: 220 mg/dL — ABNORMAL HIGH (ref 70–99)
HCT: 27 % — ABNORMAL LOW (ref 39.0–52.0)
HCT: 27 % — ABNORMAL LOW (ref 39.0–52.0)
Hemoglobin: 9.2 g/dL — ABNORMAL LOW (ref 13.0–17.0)
Hemoglobin: 9.2 g/dL — ABNORMAL LOW (ref 13.0–17.0)
Potassium: 4.5 mEq/L (ref 3.7–5.3)
Potassium: 4.5 mEq/L (ref 3.7–5.3)
Sodium: 139 mEq/L (ref 137–147)
Sodium: 140 mEq/L (ref 137–147)

## 2014-01-28 LAB — STOOL CULTURE

## 2014-01-28 LAB — SURGICAL PCR SCREEN
MRSA, PCR: NEGATIVE
Staphylococcus aureus: NEGATIVE

## 2014-01-28 LAB — PREPARE RBC (CROSSMATCH)

## 2014-01-28 LAB — ABO/RH: ABO/RH(D): B POS

## 2014-01-28 SURGERY — CREATION, BYPASS, ARTERIAL, FEMORAL TO TIBIAL, USING GRAFT
Anesthesia: General | Site: Toe | Laterality: Left

## 2014-01-28 MED ORDER — MIDAZOLAM HCL 2 MG/2ML IJ SOLN
INTRAMUSCULAR | Status: AC
  Filled 2014-01-28: qty 2

## 2014-01-28 MED ORDER — ARTIFICIAL TEARS OP OINT
TOPICAL_OINTMENT | OPHTHALMIC | Status: DC | PRN
  Administered 2014-01-28: 1 via OPHTHALMIC

## 2014-01-28 MED ORDER — HEPARIN SODIUM (PORCINE) 1000 UNIT/ML IJ SOLN
INTRAMUSCULAR | Status: AC
  Filled 2014-01-28: qty 1

## 2014-01-28 MED ORDER — PAPAVERINE HCL 30 MG/ML IJ SOLN
INTRAMUSCULAR | Status: AC
  Filled 2014-01-28: qty 2

## 2014-01-28 MED ORDER — ONDANSETRON HCL 4 MG/2ML IJ SOLN
INTRAMUSCULAR | Status: DC | PRN
  Administered 2014-01-28 (×2): 4 mg via INTRAVENOUS

## 2014-01-28 MED ORDER — FENTANYL CITRATE 0.05 MG/ML IJ SOLN
INTRAMUSCULAR | Status: AC
  Filled 2014-01-28: qty 5

## 2014-01-28 MED ORDER — EPHEDRINE SULFATE 50 MG/ML IJ SOLN
INTRAMUSCULAR | Status: AC
  Filled 2014-01-28: qty 1

## 2014-01-28 MED ORDER — PANTOPRAZOLE SODIUM 40 MG PO TBEC
40.0000 mg | DELAYED_RELEASE_TABLET | Freq: Every day | ORAL | Status: DC
  Administered 2014-01-28 – 2014-02-07 (×12): 40 mg via ORAL
  Filled 2014-01-28 (×12): qty 1

## 2014-01-28 MED ORDER — HEPARIN SODIUM (PORCINE) 1000 UNIT/ML IJ SOLN
INTRAMUSCULAR | Status: DC | PRN
  Administered 2014-01-28: 1000 [IU] via INTRAVENOUS
  Administered 2014-01-28: 9000 [IU] via INTRAVENOUS

## 2014-01-28 MED ORDER — HYDRALAZINE HCL 20 MG/ML IJ SOLN: 5.0000 mg | INTRAMUSCULAR | Status: DC | PRN

## 2014-01-28 MED ORDER — NEOSTIGMINE METHYLSULFATE 10 MG/10ML IV SOLN
INTRAVENOUS | Status: DC | PRN
  Administered 2014-01-28: 2.5 mg via INTRAVENOUS

## 2014-01-28 MED ORDER — MIDAZOLAM HCL 5 MG/5ML IJ SOLN
INTRAMUSCULAR | Status: DC | PRN
  Administered 2014-01-28: 2 mg via INTRAVENOUS

## 2014-01-28 MED ORDER — SODIUM CHLORIDE 0.9 % IV SOLN
INTRAVENOUS | Status: DC | PRN
  Administered 2014-01-28 (×2): via INTRAVENOUS

## 2014-01-28 MED ORDER — THROMBIN 20000 UNITS EX SOLR
CUTANEOUS | Status: DC | PRN
  Administered 2014-01-28: 15:00:00 via TOPICAL

## 2014-01-28 MED ORDER — PHENYLEPHRINE 40 MCG/ML (10ML) SYRINGE FOR IV PUSH (FOR BLOOD PRESSURE SUPPORT)
PREFILLED_SYRINGE | INTRAVENOUS | Status: AC
  Filled 2014-01-28: qty 20

## 2014-01-28 MED ORDER — MORPHINE SULFATE 2 MG/ML IJ SOLN: 2.0000 mg | INTRAMUSCULAR | Status: DC | PRN

## 2014-01-28 MED ORDER — 0.9 % SODIUM CHLORIDE (POUR BTL) OPTIME
TOPICAL | Status: DC | PRN
  Administered 2014-01-28 (×2): 1000 mL

## 2014-01-28 MED ORDER — LACTATED RINGERS IV SOLN
INTRAVENOUS | Status: DC | PRN
  Administered 2014-01-28: 09:00:00 via INTRAVENOUS

## 2014-01-28 MED ORDER — LIDOCAINE HCL (CARDIAC) 20 MG/ML IV SOLN
INTRAVENOUS | Status: DC | PRN
  Administered 2014-01-28: 80 mg via INTRAVENOUS

## 2014-01-28 MED ORDER — GLYCOPYRROLATE 0.2 MG/ML IJ SOLN
INTRAMUSCULAR | Status: DC | PRN
  Administered 2014-01-28: 0.3 mg via INTRAVENOUS

## 2014-01-28 MED ORDER — ROCURONIUM BROMIDE 100 MG/10ML IV SOLN
INTRAVENOUS | Status: DC | PRN
  Administered 2014-01-28: 50 mg via INTRAVENOUS

## 2014-01-28 MED ORDER — PHENOL 1.4 % MT LIQD: 1.0000 | OROMUCOSAL | Status: DC | PRN

## 2014-01-28 MED ORDER — THROMBIN 20000 UNITS EX SOLR
CUTANEOUS | Status: AC
  Filled 2014-01-28: qty 20000

## 2014-01-28 MED ORDER — LABETALOL HCL 5 MG/ML IV SOLN
10.0000 mg | INTRAVENOUS | Status: DC | PRN
  Filled 2014-01-28: qty 4

## 2014-01-28 MED ORDER — LACTATED RINGERS IV SOLN
INTRAVENOUS | Status: DC
  Administered 2014-01-28: 09:00:00 via INTRAVENOUS

## 2014-01-28 MED ORDER — METOPROLOL TARTRATE 1 MG/ML IV SOLN
2.0000 mg | INTRAVENOUS | Status: DC | PRN
  Filled 2014-01-28: qty 5

## 2014-01-28 MED ORDER — SODIUM CHLORIDE 0.9 % IR SOLN
Status: DC | PRN
  Administered 2014-01-28 (×2)

## 2014-01-28 MED ORDER — VECURONIUM BROMIDE 10 MG IV SOLR
INTRAVENOUS | Status: AC
Start: 2014-01-28 — End: 2014-01-28
  Filled 2014-01-28: qty 10

## 2014-01-28 MED ORDER — SODIUM CHLORIDE 0.9 % IJ SOLN
INTRAMUSCULAR | Status: AC
  Filled 2014-01-28: qty 10

## 2014-01-28 MED ORDER — LIDOCAINE HCL (CARDIAC) 20 MG/ML IV SOLN
INTRAVENOUS | Status: AC
Start: 2014-01-28 — End: 2014-01-28
  Filled 2014-01-28: qty 5

## 2014-01-28 MED ORDER — GUAIFENESIN-DM 100-10 MG/5ML PO SYRP
15.0000 mL | ORAL_SOLUTION | ORAL | Status: DC | PRN
  Filled 2014-01-28: qty 15

## 2014-01-28 MED ORDER — ONDANSETRON HCL 4 MG/2ML IJ SOLN
INTRAMUSCULAR | Status: AC
  Filled 2014-01-28: qty 2

## 2014-01-28 MED ORDER — FENTANYL CITRATE 0.05 MG/ML IJ SOLN
INTRAMUSCULAR | Status: DC | PRN
  Administered 2014-01-28 (×2): 50 ug via INTRAVENOUS
  Administered 2014-01-28: 100 ug via INTRAVENOUS
  Administered 2014-01-28: 50 ug via INTRAVENOUS
  Administered 2014-01-28 (×2): 100 ug via INTRAVENOUS
  Administered 2014-01-28: 50 ug via INTRAVENOUS

## 2014-01-28 MED ORDER — DOCUSATE SODIUM 100 MG PO CAPS
100.0000 mg | ORAL_CAPSULE | Freq: Every day | ORAL | Status: DC
  Administered 2014-01-29 – 2014-02-02 (×5): 100 mg via ORAL
  Filled 2014-01-28 (×6): qty 1

## 2014-01-28 MED ORDER — POTASSIUM CHLORIDE CRYS ER 20 MEQ PO TBCR
20.0000 meq | EXTENDED_RELEASE_TABLET | Freq: Every day | ORAL | Status: DC | PRN
  Filled 2014-01-28: qty 2

## 2014-01-28 MED ORDER — INSULIN ASPART 100 UNIT/ML ~~LOC~~ SOLN
SUBCUTANEOUS | Status: AC
  Filled 2014-01-28: qty 7

## 2014-01-28 MED ORDER — VECURONIUM BROMIDE 10 MG IV SOLR
INTRAVENOUS | Status: DC | PRN
  Administered 2014-01-28 (×3): 2 mg via INTRAVENOUS
  Administered 2014-01-28: 4 mg via INTRAVENOUS

## 2014-01-28 MED ORDER — SODIUM CHLORIDE 0.9 % IV SOLN
500.0000 mL | Freq: Once | INTRAVENOUS | Status: AC | PRN
  Administered 2014-01-28: 500 mL via INTRAVENOUS

## 2014-01-28 MED ORDER — DEXAMETHASONE SODIUM PHOSPHATE 4 MG/ML IJ SOLN
INTRAMUSCULAR | Status: DC | PRN
  Administered 2014-01-28: 4 mg via INTRAVENOUS

## 2014-01-28 MED ORDER — SODIUM CHLORIDE 0.9 % IV SOLN
INTRAVENOUS | Status: DC
  Administered 2014-01-28: 50 mL/h via INTRAVENOUS

## 2014-01-28 MED ORDER — PROTAMINE SULFATE 10 MG/ML IV SOLN
INTRAVENOUS | Status: DC | PRN
  Administered 2014-01-28: 40 mg via INTRAVENOUS

## 2014-01-28 MED ORDER — GLYCOPYRROLATE 0.2 MG/ML IJ SOLN
INTRAMUSCULAR | Status: AC
  Filled 2014-01-28: qty 4

## 2014-01-28 MED ORDER — INSULIN ASPART 100 UNIT/ML ~~LOC~~ SOLN
4.0000 [IU] | Freq: Once | SUBCUTANEOUS | Status: AC
  Administered 2014-01-28: 4 [IU] via SUBCUTANEOUS
  Filled 2014-01-28: qty 0.04

## 2014-01-28 MED ORDER — NEOSTIGMINE METHYLSULFATE 10 MG/10ML IV SOLN
INTRAVENOUS | Status: AC
  Filled 2014-01-28: qty 1

## 2014-01-28 MED ORDER — PROPOFOL 10 MG/ML IV BOLUS
INTRAVENOUS | Status: DC | PRN
  Administered 2014-01-28: 200 mg via INTRAVENOUS

## 2014-01-28 MED ORDER — SODIUM CHLORIDE 0.9 % IV SOLN
10.0000 mg | INTRAVENOUS | Status: DC | PRN
  Administered 2014-01-28: 15 ug/min via INTRAVENOUS

## 2014-01-28 MED ORDER — STERILE WATER FOR INJECTION IJ SOLN
INTRAMUSCULAR | Status: AC
  Filled 2014-01-28: qty 10

## 2014-01-28 MED ORDER — PAPAVERINE HCL 30 MG/ML IJ SOLN
INTRAMUSCULAR | Status: DC | PRN
  Administered 2014-01-28: 60 mg

## 2014-01-28 MED ORDER — PROPOFOL 10 MG/ML IV BOLUS
INTRAVENOUS | Status: AC
  Filled 2014-01-28: qty 20

## 2014-01-28 MED ORDER — PROTAMINE SULFATE 10 MG/ML IV SOLN
INTRAVENOUS | Status: AC
  Filled 2014-01-28: qty 5

## 2014-01-28 MED ORDER — INSULIN ASPART 100 UNIT/ML ~~LOC~~ SOLN
3.0000 [IU] | Freq: Once | SUBCUTANEOUS | Status: AC
  Administered 2014-01-28: 3 [IU] via SUBCUTANEOUS
  Filled 2014-01-28: qty 0.03

## 2014-01-28 SURGICAL SUPPLY — 84 items
ADH SKN CLS APL DERMABOND .7 (GAUZE/BANDAGES/DRESSINGS) ×3
BANDAGE ELASTIC 4 VELCRO ST LF (GAUZE/BANDAGES/DRESSINGS) ×4 IMPLANT
BANDAGE ESMARK 6X9 LF (GAUZE/BANDAGES/DRESSINGS) IMPLANT
BLADE AVERAGE 25X9 (BLADE) ×3 IMPLANT
BNDG CMPR 9X6 STRL LF SNTH (GAUZE/BANDAGES/DRESSINGS)
BNDG CONFORM 3 STRL LF (GAUZE/BANDAGES/DRESSINGS) ×4 IMPLANT
BNDG ESMARK 6X9 LF (GAUZE/BANDAGES/DRESSINGS)
BNDG GAUZE ELAST 4 BULKY (GAUZE/BANDAGES/DRESSINGS) ×4 IMPLANT
CANISTER SUCTION 2500CC (MISCELLANEOUS) ×4 IMPLANT
CANNULA VESSEL 3MM 2 BLNT TIP (CANNULA) ×10 IMPLANT
CLIP TI MEDIUM 24 (CLIP) ×4 IMPLANT
CLIP TI WIDE RED SMALL 24 (CLIP) ×10 IMPLANT
COVER SURGICAL LIGHT HANDLE (MISCELLANEOUS) ×4 IMPLANT
CUFF TOURNIQUET SINGLE 18IN (TOURNIQUET CUFF) IMPLANT
CUFF TOURNIQUET SINGLE 24IN (TOURNIQUET CUFF) IMPLANT
CUFF TOURNIQUET SINGLE 34IN LL (TOURNIQUET CUFF) IMPLANT
CUFF TOURNIQUET SINGLE 44IN (TOURNIQUET CUFF) IMPLANT
DERMABOND ADVANCED (GAUZE/BANDAGES/DRESSINGS) ×1
DERMABOND ADVANCED .7 DNX12 (GAUZE/BANDAGES/DRESSINGS) ×3 IMPLANT
DRAIN CHANNEL 15F RND FF W/TCR (WOUND CARE) IMPLANT
DRAPE EXTREMITY T 121X128X90 (DRAPE) ×4 IMPLANT
DRAPE X-RAY CASS 24X20 (DRAPES) IMPLANT
DRSG COVADERM 4X10 (GAUZE/BANDAGES/DRESSINGS) ×3 IMPLANT
DRSG COVADERM 4X14 (GAUZE/BANDAGES/DRESSINGS) ×4 IMPLANT
DRSG COVADERM 4X8 (GAUZE/BANDAGES/DRESSINGS) ×4 IMPLANT
ELECT REM PT RETURN 9FT ADLT (ELECTROSURGICAL) ×4
ELECTRODE REM PT RTRN 9FT ADLT (ELECTROSURGICAL) ×3 IMPLANT
EVACUATOR SILICONE 100CC (DRAIN) IMPLANT
GAUZE SPONGE 4X4 12PLY STRL (GAUZE/BANDAGES/DRESSINGS) ×4 IMPLANT
GAUZE SPONGE 4X4 16PLY XRAY LF (GAUZE/BANDAGES/DRESSINGS) ×3 IMPLANT
GLOVE BIO SURGEON STRL SZ 6.5 (GLOVE) ×12 IMPLANT
GLOVE BIO SURGEON STRL SZ7.5 (GLOVE) ×4 IMPLANT
GLOVE BIOGEL PI IND STRL 6.5 (GLOVE) ×9 IMPLANT
GLOVE BIOGEL PI IND STRL 7.5 (GLOVE) ×1 IMPLANT
GLOVE BIOGEL PI IND STRL 8 (GLOVE) ×3 IMPLANT
GLOVE BIOGEL PI INDICATOR 6.5 (GLOVE) ×3
GLOVE BIOGEL PI INDICATOR 7.5 (GLOVE) ×1
GLOVE BIOGEL PI INDICATOR 8 (GLOVE) ×1
GLOVE ECLIPSE 6.5 STRL STRAW (GLOVE) ×4 IMPLANT
GLOVE SKINSENSE NS SZ7.0 (GLOVE) ×2
GLOVE SKINSENSE STRL SZ7.0 (GLOVE) ×4 IMPLANT
GLOVE SS BIOGEL STRL SZ 7 (GLOVE) ×2 IMPLANT
GLOVE SUPERSENSE BIOGEL SZ 7 (GLOVE) ×1
GOWN BRE IMP SLV AUR XL STRL (GOWN DISPOSABLE) ×12 IMPLANT
GOWN STRL REUS W/ TWL LRG LVL3 (GOWN DISPOSABLE) ×13 IMPLANT
GOWN STRL REUS W/TWL LRG LVL3 (GOWN DISPOSABLE) ×20
GRAFT PROPATEN THIN WALL 6X80 (Vascular Products) ×3 IMPLANT
INSERT FOGARTY SM (MISCELLANEOUS) ×3 IMPLANT
KIT BASIN OR (CUSTOM PROCEDURE TRAY) ×4 IMPLANT
KIT ROOM TURNOVER OR (KITS) ×4 IMPLANT
MARKER GRAFT CORONARY BYPASS (MISCELLANEOUS) IMPLANT
NS IRRIG 1000ML POUR BTL (IV SOLUTION) ×8 IMPLANT
PACK GENERAL/GYN (CUSTOM PROCEDURE TRAY) ×1 IMPLANT
PACK PERIPHERAL VASCULAR (CUSTOM PROCEDURE TRAY) ×4 IMPLANT
PAD ARMBOARD 7.5X6 YLW CONV (MISCELLANEOUS) ×8 IMPLANT
PADDING CAST COTTON 6X4 STRL (CAST SUPPLIES) IMPLANT
PROBE PENCIL 8 MHZ STRL DISP (MISCELLANEOUS) ×4 IMPLANT
SET COLLECT BLD 21X3/4 12 (NEEDLE) IMPLANT
SPECIMEN JAR SMALL (MISCELLANEOUS) ×4 IMPLANT
SPONGE LAP 18X18 X RAY DECT (DISPOSABLE) ×3 IMPLANT
SPONGE SURGIFOAM ABS GEL 100 (HEMOSTASIS) IMPLANT
STAPLER VISISTAT 35W (STAPLE) ×4 IMPLANT
STOPCOCK 4 WAY LG BORE MALE ST (IV SETS) IMPLANT
SUT ETHILON 3 0 PS 1 (SUTURE) ×2 IMPLANT
SUT PROLENE 5 0 C 1 24 (SUTURE) ×1 IMPLANT
SUT PROLENE 6 0 BV (SUTURE) ×25 IMPLANT
SUT PROLENE 7 0 BV 1 (SUTURE) IMPLANT
SUT SILK 2 0 FS (SUTURE) IMPLANT
SUT SILK 2 0 SH (SUTURE) ×6 IMPLANT
SUT SILK 3 0 (SUTURE)
SUT SILK 3-0 18XBRD TIE 12 (SUTURE) IMPLANT
SUT VIC AB 2-0 CTB1 (SUTURE) ×4 IMPLANT
SUT VIC AB 3-0 SH 27 (SUTURE) ×24
SUT VIC AB 3-0 SH 27X BRD (SUTURE) ×14 IMPLANT
SUT VICRYL 4-0 PS2 18IN ABS (SUTURE) ×16 IMPLANT
SWAB COLLECTION DEVICE MRSA (MISCELLANEOUS) IMPLANT
TAPE UMBILICAL 1/8 X36 TWILL (MISCELLANEOUS) ×2 IMPLANT
TOWEL OR 17X24 6PK STRL BLUE (TOWEL DISPOSABLE) ×8 IMPLANT
TOWEL OR 17X26 10 PK STRL BLUE (TOWEL DISPOSABLE) ×4 IMPLANT
TRAY FOLEY CATH 16FRSI W/METER (SET/KITS/TRAYS/PACK) ×4 IMPLANT
TUBE ANAEROBIC SPECIMEN COL (MISCELLANEOUS) IMPLANT
TUBING EXTENTION W/L.L. (IV SETS) IMPLANT
UNDERPAD 30X30 INCONTINENT (UNDERPADS AND DIAPERS) ×4 IMPLANT
WATER STERILE IRR 1000ML POUR (IV SOLUTION) ×4 IMPLANT

## 2014-01-28 NOTE — Anesthesia Procedure Notes (Addendum)
Procedure Name: Intubation Date/Time: 01/28/2014 9:47 AM Performed by: Wray KearnsFOLEY, Jordan Winebarger A Pre-anesthesia Checklist: Patient identified, Timeout performed, Emergency Drugs available, Suction available and Patient being monitored Patient Re-evaluated:Patient Re-evaluated prior to inductionOxygen Delivery Method: Circle system utilized Preoxygenation: Pre-oxygenation with 100% oxygen Intubation Type: IV induction and Cricoid Pressure applied Ventilation: Mask ventilation without difficulty and Oral airway inserted - appropriate to patient size Laryngoscope Size: Miller and 2 Grade View: Grade III Tube type: Oral Tube size: 8.0 mm Number of attempts: 2 (Attempt times one unsuccessful / #4Mac - RF . Attempt times one successful Dr. Jacklynn BueMassagee / #2 Colin InaMiller Blade .) Airway Equipment and Method: Stylet (Glydescope in OR room available at Induction .) Placement Confirmation: ETT inserted through vocal cords under direct vision,  breath sounds checked- equal and bilateral and positive ETCO2 Secured at: 23 cm Tube secured with: Tape Dental Injury: Teeth and Oropharynx as per pre-operative assessment  Difficulty Due To: Difficulty was anticipated, Difficult Airway- due to reduced neck mobility, Difficult Airway- due to anterior larynx and Difficult Airway- due to dentition Comments: Attempt times one unsuccessful #4Mac Blade / RF . Attempt times one successful #2 Colin InaMiller Blade / Dr. Jacklynn BueMassagee .

## 2014-01-28 NOTE — Op Note (Signed)
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 Left common femoral artery to anterior tibial artery bypass with composite PTFE left greater saphenous vein graft 2. Open ray amputation of the left fifth toe  SURGEON: Di Kindlehristopher S. Edilia Boickson, MD, FACS  ASSIST: Doreatha MassedSamantha Rhyne, PA Maurine New Carlisleollins PA  ANESTHESIA: Gen.   EBL: 100 cc  INDICATIONS: Jordan [name] is a 73 y.o. male with an extensive diabetic foot infection of the left foot. He had an infrainguinal arterial occlusive disease and was not a candidate for an endovascular approach. I felt that his only option for limb salvage was attempted revascularization.  FINDINGS: the distal half of the greater saphenous vein was sclerotic and could not be used. Therefore a composite PTFE left greater saphenous vein graft was used. The anterior tibial artery was small. The common femoral artery had a good pulse.  TECHNIQUE: The patient was taken to the operating room and received a general anesthetic. The left lower extremity was prepped and draped in usual sterile fashion. He had a large abdomen and therefore elected to make a horizontal incision above the inguinal crease in order to help with wound healing postoperatively. The dissection was carried down vertically to the common femoral artery which was soft and had a good pulse. There was some posterior plaque. I was able to control a large posterior branch and enough of the common femoral artery for proximal anastomosis.  The saphenofemoral junction was then dissected free. It was bifid from the start and the medial branch was fairly small and then became not usable. The more lateral branch was followed. Using 5 additional incisions along the medial aspect of the left leg the greater saphenous vein was harvested to the distal left leg.  Branches were divided between clips and 3-0 silk ties. The vein was adherent to the surrounding tissue which was somewhat unusual.  Next a separate longitudinal incision was made over the anterior tibial artery on the anterior lateral aspect of the left leg. The plane between the 2 muscle bellies was identified and dissection carried down to the anterior tibial artery which was small but patent at this level. I then ligated the greater saphenous vein distally and attempted to irrigate this up with heparinized saline. However the distal half of the graft was very sclerotic and would not even take a 2 mm dilator. It would not distend at all. Therefore this segment of the graft had to be excised. It was not enough vein graft for even a fem below knee pop bypass, I elected to use a composite graft using PTFE and the remaining segment of greater saphenous vein. A counterincision was made on the lateral aspect of the left thigh. The 6 mm PTFE graft was tunneled from this incision to the groin incision. A separate tunnel was then created from the incision in the thigh to the incision over the anterior tibial artery. The patient was then heparinized.  Attention was first turned to the proximal anastomosis. Common femoral artery was clamped proximally and distally and the posterior branch was controlled. A longitudinal arteriotomy was made in the common femoral artery. The graft was spatulated and sewn end-to-side to the common femoral artery using continuous 6-0 Prolene suture. Prior to completing anastomosis the graft was clamped and the inflow tested. There was excellent inflow. Next the counterincision of thigh  was where I planned on doing the vein to graft anastomosis. I used the graft in a non-reverse fashion in order to have a better size match. The valves were sharply lysed using a retrograde Mills valvulotome under distention using heparinized saline. The graft was then spatulated and sewn into into the  spatulated graft using 2 continuous 6-0 Prolene sutures. Next the clamp was released and I passed the valvulotome again to be sure there were no retained valves and was excellent flow through the graft. The graft was then flushed with heparinized saline and clipped. It was then brought to the previously created tunnel. Tourniquet was placed on the thigh. The leg was exsanguinated with an Esmarch bandage and the tourniquet inflated to 300 mmHg.  Under tourniquet control, a longitudinal arteriotomy was made in the anterior tibial artery. The vein graft was spatulated and sewn end-to-side to the artery using continuous 6-0 Prolene suture. Completion was an excellent Doppler signal in the anterior tibial artery which was graft dependent after the tourniquet was released. The heparin was partially reversed with protamine. Hemostasis was obtained in the wounds The vein harvest incisions were closed with a deep layer of 3-0 Vicryl and a subcutaneous tenderness layer of 4-0 Vicryl. The incision over the distal graft was closed with a running 3-0 Vicryl and the skin closed with staples because of the tension on the incision here. The counterincision in the thigh was closed with 2 deep layers of 3-0 Vicryl and skin closed with 4-0 Vicryl. The groin incision was closed with deeper 2-0 Vicryl the subcutaneous layer with 3-0 Vicryl and the skin closed with 40 subcutaneous stitch. Sterile dressings were applied on the incisions.  Attention was then turned to the left foot. A fishmouth incision was made encompassing the fifth toe and the dissection carried down to the metatarsal. There is a large amount of prematurity which was cultured. Tract along the tendon sheaths in the posterior foot but the adjacent bone at this point did not appear to be involved. The ring amputation was performed of the fifth toe. The wound was irrigated and hemostasis obtained. The wound was then packed open. Sterile dressing was applied. The patient  tolerated the procedure well and was transferred to the recovery room in stable condition. All needle and sponge counts were correct.  Waverly Ferrarihristopher Tagan Bartram, MD, FACS [facility]  DATE OF DICTATION:   01/28/2014

## 2014-01-28 NOTE — H&P (View-Only) (Signed)
   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 Without revascularization, he will require amputation. As per Dr. Arida's note, he was not a candidate for an endovascular revascularization. Therefore, I think his only chance for limb salvage would be a femoral to anterior tibial artery bypass. If the vein were not adequate in length, he could potentially have a femoropopliteal bypass as an alternative. However, even with revascularization, given the extent of the wound there is significant risk of limb loss. I have reviewed his vein mapping which shows reasonable size greater saphenous vein on the left. I have reviewed the indications for lower extremity bypass. I have also reviewed the potential complications of surgery including but not limited to: wound healing problems, infection, graft thrombosis, limb loss, or other unpredictable medical problems. All the patient's questions were answered and they are agreeable to proceed. His surgery is scheduled for tomorrow.  SUBJECTIVE: No specific complaints.  PHYSICAL EXAM: Filed Vitals:   01/26/14 1610 01/26/14 1700 01/26/14 2129 01/27/14 0639  BP: 141/52 135/60 126/62 163/64  Pulse: 79 81 78 101  Temp:  98.2 F (36.8 C) 98.4 F (36.9 C) 100.4 F (38 C)  TempSrc:  Oral Oral Oral  Resp: 16 18 18 17  Height:      Weight:      SpO2: 100% 100% 95% 100%   No change in exam.  LABS: Lab Results  Component Value Date   WBC 15.4* 01/25/2014   HGB 10.1* 01/25/2014   HCT 30.4* 01/25/2014   MCV 83.7 01/25/2014   PLT 333 01/25/2014   Lab Results  Component Value Date   CREATININE 1.79* 01/27/2014   Lab Results  Component Value Date   INR 1.33 01/24/2014   CBG (last 3)   Recent Labs  01/26/14 1549 01/26/14 1700 01/26/14 2131  GLUCAP 121* 127* 185*    Principal Problem:   Diabetic foot ulcer Active Problems:   Diabetes  Hyperlipidemia   Essential hypertension   Coronary atherosclerosis   Cerebrovascular disease   Peripheral vascular disease   PAD (peripheral artery disease)   Diabetic osteomyelitis   Acute renal failure   CKD (chronic kidney disease), stage III   Leukocytosis   Fever presenting with conditions classified elsewhere   Diarrhea   Gangrene   Jordan [name] Beeper: [phone] 01/27/2014    

## 2014-01-28 NOTE — Transfer of Care (Signed)
Immediate Anesthesia Transfer of Care Note  Patient: Jordan [name]  Procedure(s) Performed: Procedure(s):  FEMORAL-ANTERIOR TIBIAL ARTERY Bypass Graft utilizing composite gortex graft and vein graft (Left) AMPUTATION DIGIT- LEFT 5TH TOE (Left)  Patient Location: PACU  Anesthesia Type:General  Level of Consciousness: sedated and responds to stimulation  Airway & Oxygen Therapy: Patient Spontanous Breathing and Patient connected to face mask oxygen  Post-op Assessment: Report given to PACU RN, Post -op Vital signs reviewed and stable, Patient moving all extremities and Patient moving all extremities X 4  Post vital signs: Reviewed and stable  Complications: No apparent anesthesia complications

## 2014-01-28 NOTE — Progress Notes (Signed)
 [facility] PROGRESS NOTE Interim History: 73 y.o. male who presents with non healing wound to left foot. He states that he went to the foot doctor ~ 3 weeks ago and underwent some debridement of calluses on his foot and this is when this wound developed. He states that there were several places, but this one did not heal. His wife has been tending to the wound with silvadene. He was started on Keflex ~ 2 weeks ago.he developed worsening fever and chills and presented to outside hospital and was then transferred to [place]. He is now on Vanc/Zosyn. Consult ortho who rec vascular surgery consult, who recommended cardiology Dr. Arida who rec angiogram on 10.28.2015   Assessment/Plan: Gangrene left foot/ sepsis - PVD:  - Have consulted cardiology eval- ABI < 0.5 B/L - Started on  Zosyn and Vanc 10.28.2015 - Has remained afebrile. - Angiogram on 10.28.2015 showed Flush occlusion of the left SFA  cardiology recommendedFemoropopliteal bypass versus femoral tibial bypass. - For amputation on 10.30.2015.  Diabetes Mellitus 2, insulin requiring  - cont Lantus and Novolog-  - BG cont to improve. - A1c 8.9 BG poorly controlled. Will need aggressive treatment when ready to go home  Diarrhea  - due to antibiotics - c diff negative - follow for improvement   Essential hypertension  - cont Imdur, Norvasc, Toprol   Coronary atherosclerosis  Cont ASA   Peripheral vascular disease  - cont ASA- vascular sx consulted. - see above for further details.  CKD (chronic kidney disease), stage III  - Cr increased today- may be related to Vancomycin vs dehydration as sodium low as well - baseline appears to be 1.2-1.3  - will start slow IVF and follow B met   Code Status: Full code  Family Communication: none  Disposition Plan: to be determined  DVT prophylaxis: Lovenox    Consultants:  Ortho  Vascular  cardio  Procedures:  Angiogram 10.287.2015  Antibiotics:  vanc  zosyn  HPI/Subjective: No compalins  Objective: Filed Vitals:   01/27/14 2124 01/28/14 0039 01/28/14 0531 01/28/14 0800  BP: 158/74  156/61 153/58  Pulse: 65  95 90  Temp: 100.1 F (37.8 C) 98.5 F (36.9 C) 100.7 F (38.2 C)   TempSrc: Oral Oral Oral   Resp: 17  16   Height: 5' 8.9" (1.75 m)     Weight: 102.34 kg (225 lb 9.9 oz)     SpO2: 97%  95% 95%    Intake/Output Summary (Last 24 hours) at 01/28/14 1150 Last data filed at 01/28/14 1100  Gross per 24 hour  Intake 2319.6 ml  Output    500 ml  Net 1819.6 ml   Filed Weights   01/25/14 2030 01/26/14 0834 01/27/14 2124  Weight: 105 kg (231 lb 7.7 oz) 101.1 kg (222 lb 14.2 oz) 102.34 kg (225 lb 9.9 oz)    Exam:  General: Alert, awake, oriented x3, in no acute distress.  HEENT: No bruits, no goiter.  Heart: Regular rate and rhythm. Lungs: Good air movement, clear Abdomen: Soft, nontender, nondistended, positive bowel sounds.     Data Reviewed: Basic Metabolic Panel:  Recent Labs Lab 01/24/14 0535 01/25/14 0528 01/26/14 0638 01/27/14 0602 01/28/14 0450  NA 133* 136* 137 137 140  K 4.3 4.1 3.9 3.9 4.1  CL 95* 98 99 98 103  CO2 21 21 23 22 23  GLUCOSE 164* 251* 150* 156* 180*  BUN 17 23 20 19 15  CREATININE 1.45* 1.73* 1.77* 1.79* 1.69*    CALCIUM 8.9 8.6 8.8 9.1 8.6   Liver Function Tests:  Recent Labs Lab 01/24/14 0535  AST 33  ALT 27  ALKPHOS 96  BILITOT 0.8  PROT 8.0  ALBUMIN 2.4*   No results found for this basename: LIPASE, AMYLASE,  in the last 168 hours No results found for this basename: AMMONIA,  in the last 168 hours CBC:  Recent Labs Lab 01/24/14 0535 01/25/14 0528 01/28/14 0450  WBC 16.2* 15.4* 19.4*  HGB 10.4* 10.1* 9.8*  HCT 31.7* 30.4* 29.7*  MCV 82.6 83.7 82.0  PLT 325 333 399   Cardiac Enzymes: No results found for this basename: CKTOTAL, CKMB, CKMBINDEX, TROPONINI,  in the last 168 hours BNP (last 3 results) No results found for this basename: PROBNP,  in the last  8760 hours CBG:  Recent Labs Lab 01/27/14 1148 01/27/14 1604 01/27/14 2122 01/28/14 0036 01/28/14 0732  GLUCAP 148* 208* 214* 163* 201*    Recent Results (from the past 240 hour(s))  CULTURE, BLOOD (ROUTINE X 2)     Status: None   Collection Time    01/24/14  5:35 AM      Result Value Ref Range Status   Specimen Description BLOOD LEFT ARM   Final   Special Requests BOTTLES DRAWN AEROBIC ONLY 5CC   Final   Culture  Setup Time     Final   Value: 01/24/2014 09:47     Performed at [facility]   Culture     Final   Value:        BLOOD CULTURE RECEIVED NO GROWTH TO DATE CULTURE WILL BE HELD FOR 5 DAYS BEFORE ISSUING A FINAL NEGATIVE REPORT     Performed at [facility]   Report Status PENDING   Incomplete  CULTURE, BLOOD (ROUTINE X 2)     Status: None   Collection Time    01/24/14  5:45 AM      Result Value Ref Range Status   Specimen Description BLOOD LEFT ARM   Final   Special Requests BOTTLES DRAWN AEROBIC AND ANAEROBIC 5CC EACH   Final   Culture  Setup Time     Final   Value: 01/24/2014 09:47     Performed at [facility]   Culture     Final   Value:        BLOOD CULTURE RECEIVED NO GROWTH TO DATE CULTURE WILL BE HELD FOR 5 DAYS BEFORE ISSUING A FINAL NEGATIVE REPORT     Performed at [facility]   Report Status PENDING   Incomplete  CLOSTRIDIUM DIFFICILE BY PCR     Status: None   Collection Time    01/24/14  8:07 PM      Result Value Ref Range Status   C difficile by pcr NEGATIVE  NEGATIVE Final  STOOL CULTURE     Status: None   Collection Time    01/24/14  8:07 PM      Result Value Ref Range Status   Specimen Description STOOL   Final   Special Requests NONE   Final   Culture     Final   Value: NO SALMONELLA, SHIGELLA, CAMPYLOBACTER, YERSINIA, OR E.COLI 0157:H7 ISOLATED     Performed at [facility]   Report Status 01/28/2014 FINAL   Final  SURGICAL PCR SCREEN     Status: None   Collection Time    01/27/14 10:56 PM       Result Value Ref Range Status  MRSA, PCR NEGATIVE  NEGATIVE Final   Staphylococcus aureus NEGATIVE  NEGATIVE Final   Comment:            The Xpert SA Assay (FDA     approved for NASAL specimens     in patients over 61 years of age),     is one component of     a comprehensive surveillance     program.  Test performance has     been validated by [employer] for patients greater     than or equal to 25 year old.     It is not intended     to diagnose infection nor to     guide or monitor treatment.     Studies: No results found.  Scheduled Meds: . [MAR HOLD] amLODipine  10 mg Oral Daily  . [facility] HOLD] aspirin  81 mg Oral Daily  . [MAR HOLD] enoxaparin (LOVENOX) injection  40 mg Subcutaneous Q24H  . [MAR HOLD] feeding supplement (GLUCERNA SHAKE)  237 mL Oral BID BM  . [MAR HOLD] feeding supplement (PRO-STAT SUGAR FREE 64)  30 mL Oral BID BM  . [MAR HOLD] insulin aspart  0-20 Units Subcutaneous TID WC  . [MAR HOLD] insulin aspart  0-5 Units Subcutaneous QHS  . [MAR HOLD] insulin glargine  42 Units Subcutaneous QHS  . [MAR HOLD] isosorbide mononitrate  30 mg Oral Daily  . [facility] HOLD] metoprolol succinate  100 mg Oral BID  . [MAR HOLD] piperacillin-tazobactam (ZOSYN)  IV  3.375 g Intravenous Q8H  . [facility] HOLD] rosuvastatin  40 mg Oral Daily  . [facility] HOLD] vancomycin  1,500 mg Intravenous Q24H   Continuous Infusions: . lactated ringers 50 mL/hr at 01/28/14 0842     Charlynne Cousins  [facility] Pager [phone]. If 8PM-8AM, please contact night-coverage at [email or web address], password [facility] 01/28/2014, 11:50 AM  LOS: 4 days

## 2014-01-28 NOTE — Interval H&P Note (Signed)
History and Physical Interval Note:  01/28/2014 9:22 AM  Jordan [name]  has presented today for surgery, with the diagnosis of Left leg ischemia M62.89 Peripheral Vascular Disease with gangrene left 5th toe  I70.262  The various methods of treatment have been discussed with the patient and family. After consideration of risks, benefits and other options for treatment, the patient has consented to  Procedure(s): BYPASS GRAFT FEMORAL-ANTERIOR TIBIAL ARTERY (Left) AMPUTATION DIGIT- LEFT 5TH TOE; POSSIBLE AMPUATION LEFT 4TH TOE (Left) TRANSMETATARSAL AMPUTATION (Left) as a surgical intervention .  The patient's history has been reviewed, patient examined, no change in status, stable for surgery.  I have reviewed the patient's chart and labs.  Questions were answered to the patient's satisfaction.     DICKSON,CHRISTOPHER S

## 2014-01-28 NOTE — Progress Notes (Signed)
eLink Physician-Brief Progress Note Patient Name: Jordan [name] DOB: 1941-02-02 MRN: [id number]   Date of Service  01/28/2014  HPI/Events of Note  S/p left common femoral artery to anterior tibial artery bypass Open ray amputation  Stable per camera check  eICU Interventions  No eICU intervention needed     Intervention Category Evaluation Type: New Patient Evaluation  MCQUAID, DOUGLAS 01/28/2014, 7:55 PM

## 2014-01-28 NOTE — Anesthesia Preprocedure Evaluation (Signed)
Anesthesia Evaluation  Patient identified by MRN, date of birth, ID band Patient awake    Reviewed: Allergy & Precautions, H&P , NPO status , Patient's Chart, lab work & pertinent test results  Airway Mallampati: III       Dental  (+) Teeth Intact   Pulmonary shortness of breath, former smoker,  breath sounds clear to auscultation        Cardiovascular hypertension, + CAD, + Past MI and + Peripheral Vascular Disease Rhythm:Regular Rate:Tachycardia + Systolic murmurs    Neuro/Psych    GI/Hepatic   Endo/Other  diabetes, Poorly ControlledMorbid obesity  Renal/GU Renal InsufficiencyRenal disease     Musculoskeletal   Abdominal (+) + obese,   Peds  Hematology   Anesthesia Other Findings   Reproductive/Obstetrics                             Anesthesia Physical Anesthesia Plan  ASA: III  Anesthesia Plan: General   Post-op Pain Management:    Induction: Intravenous  Airway Management Planned: Oral ETT  Additional Equipment:   Intra-op Plan:   Post-operative Plan: Extubation in OR  Informed Consent: I have reviewed the patients History and Physical, chart, labs and discussed the procedure including the risks, benefits and alternatives for the proposed anesthesia with the patient or authorized representative who has indicated his/her understanding and acceptance.   Dental advisory given  Plan Discussed with: CRNA and Surgeon  Anesthesia Plan Comments:         Anesthesia Quick Evaluation

## 2014-01-28 NOTE — Anesthesia Postprocedure Evaluation (Signed)
  Anesthesia Post-op Note  Patient: Jordan [name]  Procedure(s) Performed: Procedure(s):  FEMORAL-ANTERIOR TIBIAL ARTERY Bypass Graft utilizing composite gortex graft and vein graft (Left) AMPUTATION DIGIT- LEFT 5TH TOE (Left)  Patient Location: PACU  Anesthesia Type:General  Level of Consciousness: awake, alert , oriented and patient cooperative  Airway and Oxygen Therapy: Patient Spontanous Breathing and Patient connected to nasal cannula oxygen  Post-op Pain: none  Post-op Assessment: Post-op Vital signs reviewed, Patient's Cardiovascular Status Stable, Respiratory Function Stable, Patent Airway, No signs of Nausea or vomiting and Pain level controlled  Post-op Vital Signs: Reviewed and stable  Last Vitals:  Filed Vitals:   01/28/14 1838  BP: 148/79  Pulse: 65  Temp: 36.4 C  Resp: 24    Complications: No apparent anesthesia complications

## 2014-01-29 LAB — GLUCOSE, CAPILLARY
Glucose-Capillary: 186 mg/dL — ABNORMAL HIGH (ref 70–99)
Glucose-Capillary: 237 mg/dL — ABNORMAL HIGH (ref 70–99)
Glucose-Capillary: 236 mg/dL — ABNORMAL HIGH (ref 70–99)
Glucose-Capillary: 270 mg/dL — ABNORMAL HIGH (ref 70–99)

## 2014-01-29 LAB — BASIC METABOLIC PANEL WITH GFR
Anion gap: 14 (ref 5–15)
BUN: 21 mg/dL (ref 6–23)
CO2: 23 meq/L (ref 19–32)
Calcium: 7.9 mg/dL — ABNORMAL LOW (ref 8.4–10.5)
Chloride: 104 meq/L (ref 96–112)
Creatinine, Ser: 1.69 mg/dL — ABNORMAL HIGH (ref 0.50–1.35)
GFR calc Af Amer: 45 mL/min — ABNORMAL LOW
GFR calc non Af Amer: 38 mL/min — ABNORMAL LOW
Glucose, Bld: 225 mg/dL — ABNORMAL HIGH (ref 70–99)
Potassium: 4.5 meq/L (ref 3.7–5.3)
Sodium: 141 meq/L (ref 137–147)

## 2014-01-29 LAB — CBC
HCT: 25.9 % — ABNORMAL LOW (ref 39.0–52.0)
Hemoglobin: 8.5 g/dL — ABNORMAL LOW (ref 13.0–17.0)
MCH: 27.2 pg (ref 26.0–34.0)
MCHC: 32.8 g/dL (ref 30.0–36.0)
MCV: 83 fL (ref 78.0–100.0)
Platelets: 406 10*3/uL — ABNORMAL HIGH (ref 150–400)
RBC: 3.12 MIL/uL — ABNORMAL LOW (ref 4.22–5.81)
RDW: 14.9 % (ref 11.5–15.5)
WBC: 17.9 10*3/uL — ABNORMAL HIGH (ref 4.0–10.5)

## 2014-01-29 LAB — APTT: aPTT: 35 s (ref 24–37)

## 2014-01-29 MED ORDER — ENOXAPARIN SODIUM 30 MG/0.3ML ~~LOC~~ SOLN
30.0000 mg | SUBCUTANEOUS | Status: DC
  Administered 2014-01-29 – 2014-01-30 (×2): 30 mg via SUBCUTANEOUS
  Filled 2014-01-29 (×2): qty 0.3

## 2014-01-29 MED ORDER — INSULIN GLARGINE 100 UNIT/ML ~~LOC~~ SOLN
30.0000 [IU] | Freq: Two times a day (BID) | SUBCUTANEOUS | Status: DC
  Administered 2014-01-29 – 2014-01-31 (×5): 30 [IU] via SUBCUTANEOUS
  Filled 2014-01-29 (×6): qty 0.3

## 2014-01-29 NOTE — Evaluation (Signed)
Physical Therapy Evaluation Patient Details Name: Jordan [name] MRN: [id number] DOB: 1940/11/05 Today's Date: 01/29/2014   History of Present Illness    73 y.o. male who presents with non healing wound to left foot. He states that he went to the foot doctor ~ 3 weeks ago and underwent some debridement of calluses on his foot and this is when this wound developed. He states that there were several places, but this one did not heal. His wife has been tending to the wound with silvadene. He was started on Keflex ~ 2 weeks ago.he developed worsening fever and chills and presented to outside hospital and was then [facility]. Diagnosis of Gangrene left foot/ sepsis - PVD with resulting Amputation of the left fifth toe with Left common femoral artery to anterior tibial artery bypass with composite PTFE left greater saphenous vein graft on 10.30.2015.      Clinical Impression  Pt admitted with above. Pt currently with functional limitations due to the deficits listed below (see PT Problem List).Pt should progress and be able to go home with HHPT f/u and RW as he states his wife can provide 24 hour care.    Pt will benefit from skilled PT to increase their independence and safety with mobility to allow discharge to the venue listed below.     Follow Up Recommendations Home health PT;Supervision/Assistance - 24 hour    Equipment Recommendations  Rolling walker with 5" wheels    Recommendations for Other Services       Precautions / Restrictions Precautions Precautions: Fall Restrictions Weight Bearing Restrictions: No      Mobility  Bed Mobility Overal bed mobility: Needs Assistance Bed Mobility: Supine to Sit     Supine to sit: Min assist     General bed mobility comments: Needed a little assist for elevation of trunk.  Transfers Overall transfer level: Needs assistance Equipment used: Rolling walker (2 wheeled) Transfers: Sit to/from [employer];Stand Pivot Transfers Sit  to Stand: Min assist Stand pivot transfers: Min assist       General transfer comment: Needed cues for hand placement and assist for power up.  Cues needed to stand and turn with RW.   Ambulation/Gait                Stairs            Wheelchair Mobility    Modified Rankin (Stroke Patients Only)       Balance Overall balance assessment: Needs assistance Sitting-balance support: No upper extremity supported;Feet supported Sitting balance-Leahy Scale: Good     Standing balance support: Bilateral upper extremity supported;During functional activity Standing balance-Leahy Scale: Poor Standing balance comment: requires use of RW for balance in standing.                               Pertinent Vitals/Pain Pain Assessment: 0-10 Pain Score: 5  Pain Location: left foot Pain Descriptors / Indicators: Aching;Sore Pain Intervention(s): Limited activity within patient's tolerance;Monitored during session;Premedicated before session;Repositioned VSS    Home Living Family/patient expects to be discharged to:: Private residence Living Arrangements: Spouse/significant other Available Help at Discharge: Family;Available 24 hours/day Type of Home: House Home Access: Stairs to enter Entrance Stairs-Rails: None Entrance Stairs-Number of Steps: 2+1 Home Layout: One level Home Equipment: None      Prior Function Level of Independence: Independent               Hand  Dominance        Extremity/Trunk Assessment   Upper Extremity Assessment: Defer to OT evaluation           Lower Extremity Assessment: Generalized weakness      Cervical / Trunk Assessment: Normal  Communication   Communication: No difficulties  Cognition Arousal/Alertness: Awake/alert Behavior During Therapy: WFL for tasks assessed/performed Overall Cognitive Status: Within Functional Limits for tasks assessed                      General Comments       Exercises        Assessment/Plan    PT Assessment Patient needs continued PT services  PT Diagnosis Generalized weakness;Acute pain   PT Problem List Decreased activity tolerance;Decreased balance;Decreased mobility;Decreased knowledge of use of DME;Decreased safety awareness;Decreased knowledge of precautions;Pain  PT Treatment Interventions DME instruction;Gait training;Functional mobility training;Therapeutic activities;Therapeutic exercise;Balance training;Patient/family education   PT Goals (Current goals can be found in the Care Plan section) Acute Rehab PT Goals Patient Stated Goal: to go home PT Goal Formulation: With patient Time For Goal Achievement: 02/05/14 Potential to Achieve Goals: Good    Frequency Min 3X/week   Barriers to discharge        Co-evaluation               End of Session Equipment Utilized During Treatment: Gait belt Activity Tolerance: Patient limited by fatigue;Patient limited by pain Patient left: in chair;with call bell/phone within reach Nurse Communication: Mobility status         Time: 1100-1114 PT Time Calculation (min): 14 min   Charges:   PT Evaluation $Initial PT Evaluation Tier I: 1 Procedure PT Treatments $Therapeutic Activity: 8-22 mins   PT G CodesBerline Lopes:          Charbel Los F 01/29/2014, 11:57 AM Eber Jonesawn Kiko Ripp,PT Acute Rehabilitation [phone] (pager)

## 2014-01-29 NOTE — Progress Notes (Addendum)
[facility] PROGRESS NOTE Interim History: 72 y.o. male who presents with non healing wound to left foot. He states that he went to the foot doctor ~ 3 weeks ago and underwent some debridement of calluses on his foot and this is when this wound developed. He states that there were several places, but this one did not heal. His wife has been tending to the wound with silvadene. He was started on Keflex ~ 2 weeks ago.he developed worsening fever and chills and presented to outside hospital and was then transferred to [facility]. He is now on Vanc/Zosyn. Consult ortho who rec vascular surgery consult, who recommended cardiology Dr. Fletcher Anon who rec angiogram on 10.28.2015   Assessment/Plan: Gangrene left foot/ sepsis - PVD:  - Started on  Zosyn and Vanc 10.28.2015 consulted cardiology eval- ABI < 0.5 B/L - Has remained afebrile, leukocytosis improving. - Angiogram on 10.28.2015 showed Flush occlusion of the left SFA  cardiology recommended Femoropopliteal bypass versus femoral tibial bypass. - Amputation of the left fifth toe with Left common femoral artery to anterior tibial artery bypass with composite PTFE left greater saphenous vein graft on 10.30.2015. - will transfer to med surg floor if ok with surgery. - will de-escalate to cipro and doxy in am  Diabetes Mellitus 2, insulin requiring  - increase Lantus and cont Novolog-  - BG cont to improve. - A1c 8.9 BG poorly controlled. Will need aggressive treatment when ready to go home  Diarrhea  - due to antibiotics - c diff negative. - now resolved.  Essential hypertension  - cont Imdur, Norvasc, Toprol   Coronary atherosclerosis  Cont ASA   Peripheral vascular disease  - cont ASA- vascular sx consulted. - see above for further details.  CKD (chronic kidney disease), stage III  - Cr increased stabilize. - will start slow IVF and follow B met   Code Status: Full code  Family Communication: none  Disposition Plan: to be  determined  DVT prophylaxis: Lovenox    Consultants:  Ortho  Vascular  cardio  Procedures:  Angiogram 10.287.2015  Antibiotics:  vanc zosyn  HPI/Subjective: No compalins  Objective: Filed Vitals:   01/29/14 0400 01/29/14 0500 01/29/14 0600 01/29/14 0630  BP: 128/51 126/56 124/50   Pulse: 82 80 79 86  Temp: 98.6 F (37 C)     TempSrc: Oral     Resp: _0 Height:      Weight:    102.059 kg (225 lb)  SpO2: 100% 100% 99% 99%    Intake/Output Summary (Last 24 hours) at 01/29/14 0703 Last data filed at 01/29/14 0600  Gross per 24 hour  Intake   4550 ml  Output   2590 ml  Net   1960 ml   Filed Weights   01/27/14 2124 01/28/14 1900 01/29/14 0630  Weight: 102.34 kg (225 lb 9.9 oz) 105.3 kg (232 lb 2.3 oz) 102.059 kg (225 lb)    Exam:  General: Alert, awake, oriented x3, in no acute distress.  HEENT: No bruits, no goiter.  Heart: Regular rate and rhythm. Lungs: Good air movement, clear Abdomen: Soft, nontender, nondistended, positive bowel sounds.     Data Reviewed: Basic Metabolic Panel:  Recent Labs Lab 01/25/14 0528 01/26/14 6294 01/27/14 0602 01/28/14 0450 01/28/14 1228 01/28/14 1436 01/29/14 0354  NA 136* 137 137 140 139 140 141  K 4.1 3.9 3.9 4.1 4.5 4.5 4.5  CL 98 99 98 103  --   --  104  CO2 _0 --   --  23  GLUCOSE 251* 150* 156* 180* 202* 220* 225*  BUN _1 --   --  21  CREATININE 1.73* 1.77* 1.79* 1.69*  --   --  1.69*  CALCIUM 8.6 8.8 9.1 8.6  --   --  7.9*   Liver Function Tests:  Recent Labs Lab 01/24/14 0535  AST 33  ALT 27  ALKPHOS 96  BILITOT 0.8  PROT 8.0  ALBUMIN 2.4*   No results found for this basename: LIPASE, AMYLASE,  in the last 168 hours No results found for this basename: AMMONIA,  in the last 168 hours CBC:  Recent Labs Lab 01/24/14 0535 01/25/14 0528 01/28/14 0450 01/28/14 1228 01/28/14 1436 01/29/14 0354  WBC 16.2* 15.4* 19.4*  --   --  17.9*  HGB 10.4* 10.1* 9.8*  9.2* 9.2* 8.5*  HCT 31.7* 30.4* 29.7* 27.0* 27.0* 25.9*  MCV 82.6 83.7 82.0  --   --  83.0  PLT 325 333 399  --   --  406*   Cardiac Enzymes: No results found for this basename: CKTOTAL, CKMB, CKMBINDEX, TROPONINI,  in the last 168 hours BNP (last 3 results) No results found for this basename: PROBNP,  in the last 8760 hours CBG:  Recent Labs Lab 01/27/14 2122 01/28/14 0036 01/28/14 0732 01/28/14 1639 01/28/14 2151  GLUCAP 214* 163* 201* 229* 234*    Recent Results (from the past 240 hour(s))  CULTURE, BLOOD (ROUTINE X 2)     Status: None   Collection Time    01/24/14  5:35 AM      Result Value Ref Range Status   Specimen Description BLOOD LEFT ARM   Final   Special Requests BOTTLES DRAWN AEROBIC ONLY 5CC   Final   Culture  Setup Time     Final   Value: 01/24/2014 09:47     Performed at [facility]   Culture     Final   Value:        BLOOD CULTURE RECEIVED NO GROWTH TO DATE CULTURE WILL BE HELD FOR 5 DAYS BEFORE ISSUING A FINAL NEGATIVE REPORT     Performed at [facility]   Report Status PENDING   Incomplete  CULTURE, BLOOD (ROUTINE X 2)     Status: None   Collection Time    01/24/14  5:45 AM      Result Value Ref Range Status   Specimen Description BLOOD LEFT ARM   Final   Special Requests BOTTLES DRAWN AEROBIC AND ANAEROBIC 5CC EACH   Final   Culture  Setup Time     Final   Value: 01/24/2014 09:47     Performed at [facility]   Culture     Final   Value:        BLOOD CULTURE RECEIVED NO GROWTH TO DATE CULTURE WILL BE HELD FOR 5 DAYS BEFORE ISSUING A FINAL NEGATIVE REPORT     Performed at [facility]   Report Status PENDING   Incomplete  CLOSTRIDIUM DIFFICILE BY PCR     Status: None   Collection Time    01/24/14  8:07 PM      Result Value Ref Range Status   C difficile by pcr NEGATIVE  NEGATIVE Final  STOOL CULTURE     Status: None   Collection Time    01/24/14  8:07 PM      Result Value Ref Range  Status   Specimen  Description STOOL   Final   Special Requests NONE   Final   Culture     Final   Value: NO SALMONELLA, SHIGELLA, CAMPYLOBACTER, YERSINIA, OR E.COLI 0157:H7 ISOLATED     Performed at [facility]   Report Status 01/28/2014 FINAL   Final  SURGICAL PCR SCREEN     Status: None   Collection Time    01/27/14 10:56 PM      Result Value Ref Range Status   MRSA, PCR NEGATIVE  NEGATIVE Final   Staphylococcus aureus NEGATIVE  NEGATIVE Final   Comment:            The Xpert SA Assay (FDA     approved for NASAL specimens     in patients over 71 years of age),     is one component of     a comprehensive surveillance     program.  Test performance has     been validated by [employer] for patients greater     than or equal to 66 year old.     It is not intended     to diagnose infection nor to     guide or monitor treatment.     Studies: No results found.  Scheduled Meds: . amLODipine  10 mg Oral Daily  . aspirin  81 mg Oral Daily  . docusate sodium  100 mg Oral Daily  . feeding supplement (GLUCERNA SHAKE)  237 mL Oral BID BM  . feeding supplement (PRO-STAT SUGAR FREE 64)  30 mL Oral BID BM  . insulin aspart  0-20 Units Subcutaneous TID WC  . insulin aspart  0-5 Units Subcutaneous QHS  . insulin glargine  42 Units Subcutaneous QHS  . isosorbide mononitrate  30 mg Oral Daily  . metoprolol succinate  100 mg Oral BID  . pantoprazole  40 mg Oral Daily  . piperacillin-tazobactam (ZOSYN)  IV  3.375 g Intravenous Q8H  . rosuvastatin  40 mg Oral Daily  . vancomycin  1,500 mg Intravenous Q24H   Continuous Infusions: . sodium chloride 50 mL/hr (01/28/14 1900)     Charlynne Cousins  [facility] Pager [phone]. If 8PM-8AM, please contact night-coverage at [email or web address], password [facility] 01/29/2014, 7:03 AM  LOS: 5 days

## 2014-01-29 NOTE — Progress Notes (Signed)
Report given to 6E RN. Patient transferred via bed to 6E28. VS WDL. Patient in stable condition. 6E RN at bedside.

## 2014-01-29 NOTE — Progress Notes (Signed)
Patient ID: Jordan [name], male   DOB: 11-Dec-1940, 73 y.o.   MRN: [id number] Vascular Surgery Progress Note  Subjective: One day post left femoral to anterior tibial bypass using composite Gore-Tex-saphenous vein graft plus open fifth toe amputation. No specific complaints this a.m. No nausea or vomiting.  Objective:  Filed Vitals:   01/29/14 1001  BP: 139/60  Pulse: 84  Temp: 97.7 F (36.5 C)  Resp: 22    General alert and oriented 3 in no apparent distress Lungs no rhonchi or wheezing Left leg with 2-3+ graft pulse in subcutaneous position laterally. Foot dressing intact.   Labs:  Recent Labs Lab 01/27/14 0602 01/28/14 0450 01/29/14 0354  CREATININE 1.79* 1.69* 1.69*    Recent Labs Lab 01/27/14 0602 01/28/14 0450 01/28/14 1228 01/28/14 1436 01/29/14 0354  NA 137 140 139 140 141  K 3.9 4.1 4.5 4.5 4.5  CL 98 103  --   --  104  CO2 22 23  --   --  23  BUN 19 15  --   --  21  CREATININE 1.79* 1.69*  --   --  1.69*  GLUCOSE 156* 180* 202* 220* 225*  CALCIUM 9.1 8.6  --   --  7.9*    Recent Labs Lab 01/25/14 0528 01/28/14 0450 01/28/14 1228 01/28/14 1436 01/29/14 0354  WBC 15.4* 19.4*  --   --  17.9*  HGB 10.1* 9.8* 9.2* 9.2* 8.5*  HCT 30.4* 29.7* 27.0* 27.0* 25.9*  PLT 333 399  --   --  406*    Recent Labs Lab 01/24/14 0535  INR 1.33    I/O last 3 completed shifts: In: 5559.6 [P.O.:360; I.V.:4000; IV Piggyback:1199.6] Out: 2590 [Urine:2290; Blood:300]  Imaging: No results found.  Assessment/Plan:  POD #1   LOS: 5 days  s/p Procedure(s):  FEMORAL-ANTERIOR TIBIAL ARTERY Bypass Graft utilizing composite gortex graft and vein graft AMPUTATION DIGIT- LEFT 5TH TOE  Bypass patent 1 day post left femoral anterior tibial graft using composite Gore-Tex-saphenous vein for attempted limb salvage with infected left fifth toe We'll begin dressing changes today Dr. Lajoyce Cornersduda to to follow-up next week for possible return to OR for further debridement and/or  amputation. Doing well thus far with stable hematocrit and patent bypass    Josephina GipJames Beryl Hornberger, MD 01/29/2014 10:05 AM

## 2014-01-29 NOTE — Progress Notes (Signed)
Transfer note:  Arrival Method: Bed from 2S Mental Orientation:A&O X4 Telemetry: Box 21 Assessment: see doc flowsheet Skin: Left leg dressing IV: L forearm Pain: Denies pain Tubes: Foley cath Safety Measures: fall risk interventions placed  6700 Orientation: Patient has been oriented to the unit, staff and to the room.

## 2014-01-30 LAB — GLUCOSE, CAPILLARY
Glucose-Capillary: 159 mg/dL — ABNORMAL HIGH (ref 70–99)
Glucose-Capillary: 247 mg/dL — ABNORMAL HIGH (ref 70–99)
Glucose-Capillary: 252 mg/dL — ABNORMAL HIGH (ref 70–99)
Glucose-Capillary: 254 mg/dL — ABNORMAL HIGH (ref 70–99)

## 2014-01-30 LAB — CULTURE, BLOOD (ROUTINE X 2)
Culture: NO GROWTH
Culture: NO GROWTH

## 2014-01-30 LAB — WOUND CULTURE

## 2014-01-30 LAB — APTT: aPTT: 34 seconds (ref 24–37)

## 2014-01-30 MED ORDER — ENOXAPARIN SODIUM 40 MG/0.4ML ~~LOC~~ SOLN
40.0000 mg | SUBCUTANEOUS | Status: DC
  Administered 2014-01-31 – 2014-02-07 (×8): 40 mg via SUBCUTANEOUS
  Filled 2014-01-30 (×8): qty 0.4

## 2014-01-30 NOTE — Progress Notes (Signed)
Patient ID: Osie Cheekshomas Mintzer, male   DOB: [birth year], 73 y.o.   MRN: [id number] Vascular Surgery Progress Note  Subjective: 2 days post left femoral to anterior tibial bypass using composite Gore-Tex-saphenous vein graft and open fifth toe amputation by Dr. Edilia Boickson  Objective:  Filed Vitals:   01/30/14 0530  BP: 133/49  Pulse: 61  Temp: 99 F (37.2 C)  Resp: 19    General alert and oriented =3 Surgical incisions healing nicely Fifth toe amputation site being packed with moist saline gauze-just dressed by nursing staff 3+ pulse and bypass lateral subcutaneous position   Labs:  Recent Labs Lab 01/27/14 0602 01/28/14 0450 01/29/14 0354  CREATININE 1.79* 1.69* 1.69*    Recent Labs Lab 01/27/14 0602 01/28/14 0450 01/28/14 1228 01/28/14 1436 01/29/14 0354  NA 137 140 139 140 141  K 3.9 4.1 4.5 4.5 4.5  CL 98 103  --   --  104  CO2 22 23  --   --  23  BUN 19 15  --   --  21  CREATININE 1.79* 1.69*  --   --  1.69*  GLUCOSE 156* 180* 202* 220* 225*  CALCIUM 9.1 8.6  --   --  7.9*    Recent Labs Lab 01/25/14 0528 01/28/14 0450 01/28/14 1228 01/28/14 1436 01/29/14 0354  WBC 15.4* 19.4*  --   --  17.9*  HGB 10.1* 9.8* 9.2* 9.2* 8.5*  HCT 30.4* 29.7* 27.0* 27.0* 25.9*  PLT 333 399  --   --  406*    Recent Labs Lab 01/24/14 0535  INR 1.33    I/O last 3 completed shifts: In: 2630 [P.O.:830; I.V.:650; IV Piggyback:1150] Out: 4910 [Urine:4910]  Imaging: No results found.  Assessment/Plan:  POD #2   LOS: 6 days  s/p Procedure(s):  FEMORAL-ANTERIOR TIBIAL ARTERY Bypass Graft utilizing composite gortex graft and vein graft AMPUTATION DIGIT- LEFT 5TH TOE  Patient doing well with patent bypass graft Dr. Edilia Boickson to further evaluate in a.m. And Dr. Lajoyce Cornersuda to possibly return patient to OR for further debridement and/or amputation at a later date   Josephina GipJames Lora Glomski, MD 01/30/2014 9:34 AM

## 2014-01-30 NOTE — Plan of Care (Signed)
Problem: Phase II Progression Outcomes Goal: IV changed to normal saline lock Outcome: Completed/Met Date Met:  01/30/14

## 2014-01-30 NOTE — Progress Notes (Signed)
ANTIBIOTIC CONSULT NOTE  Pharmacy Consult for Vancocin and Zosyn Indication: cellulitis/diabetic ulcer, r/o osteo  Allergies  Allergen Reactions  . Cilostazol Other (See Comments)    Feels like feet on fire    Patient Measurements: Height: 5\' 8"  (172.7 cm) Weight: 243 lb 11.2 oz (110.542 kg) IBW/kg (Calculated) : 68.4  Vital Signs: Temp: 98.2 F (36.8 C) (11/01 1000) Temp Source: Oral (11/01 1000) BP: 139/53 mmHg (11/01 1000) Pulse Rate: 74 (11/01 1000)     Assessment: 73yo male w/ DM and PVD c/o diabetic foot ulcer x2wk w/ diarrhea starting after began Keflex and w/o proper healing of ulcer. On vanc/zosyn day # 7. POD# 2  post left femoral to anterior tibial bypass using composite Gore-Tex-saphenous vein graft and open fifth toe amputation by Dr. Edilia Boickson.  Dr. Edilia Boickson to further evaluate in a.m. And Dr. Lajoyce Cornersuda to possibly return patient to OR for further debridement and/or amputation at a later date.  VT 10/29 15.4 WBC trending down (17.9), creatinine steady at 1.69. Tmax 99.  MD note 10/31 mentioned de-escalate to cipro/doxy in am.   Goal of Therapy:  Vancomycin trough level 15-20 mcg/ml (will decrease if osteo r/o)  Plan:  -Continue vancomycin 1500mg  IV q24h -continue zosyn 3.375 gm IV q8h, infuse each dose over 4 hours -Monitor renal function closely -Monitor CBC, Cx, levels prn. -f/o for transition to cipro/doxy per [facility] note 10/31 & 11/1.  Herby AbrahamMichelle T. Trezure Cronk, Pharm.D. [phone] 01/30/2014 11:43 AM

## 2014-01-30 NOTE — Plan of Care (Signed)
Problem: Phase II Progression Outcomes Goal: Dressings dry/intact Outcome: Completed/Met Date Met:  01/30/14 Patient's incisions are clean, dry, and intact. Incisions on LLE wrapped in guaze, Kerlix, and compression wrap.

## 2014-01-30 NOTE — Progress Notes (Signed)
Occupational Therapy Evaluation Patient Details Name: Jordan [name] MRN: [id number] DOB: Jan 25, 1941 Today's Date: 01/30/2014    History of Present Illness Jordan [name] is a 73 y.o. 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 PMH of DM, HTN, coronary atherosclerosis, CVD, PVD, PAD, diabetic osteomyelitis, CKD stage III, leukocytosis, and gangrene.    Clinical Impression   PTA pt lived at home with his wife and was independent with ADLs. Pt currently requires Min (A) for functional mobility and ADLs due to fatigue and pain. Pt will benefit from acute OT to address functional mobility and LB ADLs prior to d/c home.     Follow Up Recommendations  No OT follow up;Supervision/Assistance - 24 hour    Equipment Recommendations  3 in 1 bedside comode    Recommendations for Other Services       Precautions / Restrictions Precautions Precautions: Fall Restrictions Weight Bearing Restrictions: No      Mobility Bed Mobility Overal bed mobility: Needs Assistance Bed Mobility: Supine to Sit     Supine to sit: Min guard;HOB elevated     General bed mobility comments: Pt able to move to EOB with use of bedrails and no physical assist.   Transfers Overall transfer level: Needs assistance Equipment used: Rolling walker (2 wheeled) Transfers: Sit to/from Stand Sit to Stand: Min assist         General transfer comment: VC's for hand placement and Min (A) for balance upon rising. VC's for safe RW use    Balance Overall balance assessment: Needs assistance Sitting-balance support: No upper extremity supported;Feet supported Sitting balance-Leahy Scale: Good     Standing balance support: Single extremity supported;During functional activity Standing balance-Leahy Scale: Fair Standing balance comment: Pt able to stand at sink and  perform grooming with on hand on sink for balance.                             ADL Overall ADL's : Needs assistance/impaired Eating/Feeding: Independent;Sitting   Grooming: Min guard;Standing   Upper Body Bathing: Set up;Sitting   Lower Body Bathing: Minimal assistance;Sit to/from stand   Upper Body Dressing : Set up;Sitting   Lower Body Dressing: Moderate assistance;Sit to/from stand   Toilet Transfer: Minimal assistance;Ambulation;RW (from bed>recliner)           Functional mobility during ADLs: Min guard;Rolling walker;Cueing for safety General ADL Comments: Pt requires VC's for transfers and min (A) for safety. Pt presents with deconditioning and fatigues quickly during ADLs.      Vision  Pt reports no change from baseline.                   Perception Perception Perception Tested?: No   Praxis Praxis Praxis tested?: Within functional limits    Pertinent Vitals/Pain Pain Assessment: 0-10 Pain Score: 4  Pain Location: Left foot Pain Descriptors / Indicators: Aching Pain Intervention(s): Limited activity within patient's tolerance     Hand Dominance     Extremity/Trunk Assessment Upper Extremity Assessment Upper Extremity Assessment: Generalized weakness   Lower Extremity Assessment Lower Extremity Assessment: Defer to PT evaluation   Cervical / Trunk Assessment Cervical / Trunk Assessment: Normal   Communication Communication Communication: No difficulties   Cognition Arousal/Alertness: Awake/alert Behavior During Therapy: WFL for tasks assessed/performed Overall Cognitive Status: Within Functional Limits for  tasks assessed                                Home Living Family/patient expects to be discharged to:: Private residence Living Arrangements: Spouse/significant other Available Help at Discharge: Family;Available 24 hours/day Type of Home: House Home Access: Stairs to enter Entergy CorporationEntrance Stairs-Number of Steps:  2+1 Entrance Stairs-Rails: None Home Layout: One level     Bathroom Shower/Tub: Producer, television/film/videoWalk-in shower   Bathroom Toilet: Standard     Home Equipment: None          Prior Functioning/Environment Level of Independence: Independent             OT Diagnosis: Generalized weakness;Acute pain   OT Problem List: Decreased strength;Decreased range of motion;Decreased activity tolerance;Impaired balance (sitting and/or standing);Decreased safety awareness;Decreased knowledge of use of DME or AE;Decreased knowledge of precautions;Pain   OT Treatment/Interventions: Self-care/ADL training;Therapeutic exercise;Energy conservation;DME and/or AE instruction;Therapeutic activities;Balance training;Patient/family education    OT Goals(Current goals can be found in the care plan section) Acute Rehab OT Goals Patient Stated Goal: to move better OT Goal Formulation: With patient Time For Goal Achievement: 02/13/14 Potential to Achieve Goals: Good ADL Goals Pt Will Perform Grooming: with modified independence;standing Pt Will Perform Lower Body Dressing: with set-up;with supervision;sit to/from stand Pt Will Transfer to Toilet: with supervision;ambulating;bedside commode Pt Will Perform Toileting - Clothing Manipulation and hygiene: with supervision;sit to/from stand  OT Frequency: Min 2X/week    End of Session Equipment Utilized During Treatment: Gait belt;Rolling walker  Activity Tolerance: Patient tolerated treatment well Patient left: in chair;with call bell/phone within reach;with family/visitor present   Time: 0836-0910 OT Time Calculation (min): 34 min Charges:  OT General Charges $OT Visit: 1 Procedure OT Evaluation $Initial OT Evaluation Tier I: 1 Procedure OT Treatments $Self Care/Home Management : 23-37 mins  Rae LipsMiller, Ceejay Kegley M 01/30/2014, 10:37 AM   Carney LivingLeeAnn Marie Maeola Mchaney, OTR/L Occupational Therapist [phone] (pager)

## 2014-01-30 NOTE — Progress Notes (Signed)
[facility] PROGRESS NOTE Interim History: 73 y.o. male who presents with non healing wound to left foot. He states that he went to the foot doctor ~ 3 weeks ago and underwent some debridement of calluses on his foot and this is when this wound developed. He states that there were several places, but this one did not heal. His wife has been tending to the wound with silvadene. He was started on Keflex ~ 2 weeks ago.he developed worsening fever and chills and presented to outside hospital and was then [facility]. He is now on Vanc/Zosyn. Consult ortho who rec vascular surgery consult, who recommended cardiology Dr. Fletcher Anon who rec angiogram on 10.28.2015   Assessment/Plan: Gangrene left foot/ sepsis - PVD:  - Started on  Zosyn and Vanc 10.28.2015 consulted cardiology eval- ABI < 0.5 B/L - Has remained afebrile, leukocytosis improving. - Angiogram on 10.28.2015 showed Flush occlusion of the left SFA  cardiology recommended Femoropopliteal bypass versus femoral tibial bypass. - Amputation of the left fifth toe with Left common femoral artery to anterior tibial artery gortex bypass with composite PTFE left greater saphenous vein graft on 10.30.2015. Dr Sharol Given to see pt for further evaluation. - will de-escalate to cipro and doxy in am. - pt eval rec Home health PT.  Diabetes Mellitus 2, insulin requiring  - increase Lantus and cont Novolog-  - BG cont to improve. - A1c 8.9 BG poorly controlled. Will need aggressive treatment when ready to go home  Diarrhea  - due to antibiotics - c diff negative. - now resolved.  Essential hypertension  - cont Imdur, Norvasc, Toprol   Coronary atherosclerosis  Cont ASA   Peripheral vascular disease  - cont ASA- vascular sx consulted. - see above for further details.  CKD (chronic kidney disease), stage III  - Cr increased stabilize. - will start slow IVF and follow B met   Code Status: Full code  Family Communication: none    Disposition Plan: to be determined  DVT prophylaxis: Lovenox    Consultants:  Ortho  Vascular  cardio  Procedures:  Angiogram 10.287.2015  Antibiotics:  vanc zosyn  HPI/Subjective: No compalins  Objective: Filed Vitals:   01/29/14 2111 01/30/14 0530 01/30/14 0645 01/30/14 1000  BP: 138/69 133/49  139/53  Pulse: 84 61  74  Temp: 99 F (37.2 C) 99 F (37.2 C)  98.2 F (36.8 C)  TempSrc: Oral Oral  Oral  Resp: _0 Height: _1  (1.727 m)     Weight: 110.542 kg (243 lb 11.2 oz)  110.542 kg (243 lb 11.2 oz)   SpO2: 97% 98%  98%    Intake/Output Summary (Last 24 hours) at 01/30/14 1053 Last data filed at 01/30/14 1000  Gross per 24 hour  Intake   1330 ml  Output   5100 ml  Net  -3770 ml   Filed Weights   01/29/14 0630 01/29/14 2111 01/30/14 0645  Weight: 102.059 kg (225 lb) 110.542 kg (243 lb 11.2 oz) 110.542 kg (243 lb 11.2 oz)    Exam:  General: Alert, awake, oriented x3, in no acute distress.  HEENT: No bruits, no goiter.  Heart: Regular rate and rhythm. Lungs: Good air movement, clear Abdomen: Soft, nontender, nondistended.    Data Reviewed: Basic Metabolic Panel:  Recent Labs Lab 01/25/14 0528 01/26/14 1583 01/27/14 0602 01/28/14 0450 01/28/14 1228 01/28/14 1436 01/29/14 0354  NA 136* 137 137 140 139 140 141  K 4.1 3.9 3.9 4.1  4.5 4.5 4.5  CL 98 99 98 103  --   --  104  CO2 _0 --   --  23  GLUCOSE 251* 150* 156* 180* 202* 220* 225*  BUN _1 --   --  21  CREATININE 1.73* 1.77* 1.79* 1.69*  --   --  1.69*  CALCIUM 8.6 8.8 9.1 8.6  --   --  7.9*   Liver Function Tests:  Recent Labs Lab 01/24/14 0535  AST 33  ALT 27  ALKPHOS 96  BILITOT 0.8  PROT 8.0  ALBUMIN 2.4*   No results for input(s): LIPASE, AMYLASE in the last 168 hours. No results for input(s): AMMONIA in the last 168 hours. CBC:  Recent Labs Lab 01/24/14 0535 01/25/14 0528 01/28/14 0450 01/28/14 1228 01/28/14 1436 01/29/14 0354   WBC 16.2* 15.4* 19.4*  --   --  17.9*  HGB 10.4* 10.1* 9.8* 9.2* 9.2* 8.5*  HCT 31.7* 30.4* 29.7* 27.0* 27.0* 25.9*  MCV 82.6 83.7 82.0  --   --  83.0  PLT 325 333 399  --   --  406*   Cardiac Enzymes: No results for input(s): CKTOTAL, CKMB, CKMBINDEX, TROPONINI in the last 168 hours. BNP (last 3 results) No results for input(s): PROBNP in the last 8760 hours. CBG:  Recent Labs Lab 01/29/14 0809 01/29/14 1231 01/29/14 1709 01/29/14 2105 01/30/14 0742  GLUCAP 186* 237* 236* 270* 159*    Recent Results (from the past 240 hour(s))  Culture, blood (routine x 2)     Status: None (Preliminary result)   Collection Time: 01/24/14  5:35 AM  Result Value Ref Range Status   Specimen Description BLOOD LEFT ARM  Final   Special Requests BOTTLES DRAWN AEROBIC ONLY 5CC  Final   Culture  Setup Time   Final    01/24/2014 09:47 Performed at [facility]   Culture   Final           BLOOD CULTURE RECEIVED NO GROWTH TO DATE CULTURE WILL BE HELD FOR 5 DAYS BEFORE ISSUING A FINAL NEGATIVE REPORT Performed at [facility]   Report Status PENDING  Incomplete  Culture, blood (routine x 2)     Status: None (Preliminary result)   Collection Time: 01/24/14  5:45 AM  Result Value Ref Range Status   Specimen Description BLOOD LEFT ARM  Final   Special Requests BOTTLES DRAWN AEROBIC AND ANAEROBIC 5CC EACH  Final   Culture  Setup Time   Final    01/24/2014 09:47 Performed at [facility]   Culture   Final           BLOOD CULTURE RECEIVED NO GROWTH TO DATE CULTURE WILL BE HELD FOR 5 DAYS BEFORE ISSUING A FINAL NEGATIVE REPORT Performed at [facility]   Report Status PENDING  Incomplete  Clostridium Difficile by PCR     Status: None   Collection Time: 01/24/14  8:07 PM  Result Value Ref Range Status   C difficile by pcr NEGATIVE NEGATIVE Final  Stool culture     Status: None   Collection Time: 01/24/14  8:07 PM  Result Value Ref Range Status   Specimen  Description STOOL  Final   Special Requests NONE  Final   Culture   Final    NO SALMONELLA, SHIGELLA, CAMPYLOBACTER, YERSINIA, OR E.COLI 0157:H7 ISOLATED Performed at [facility]   Report Status 01/28/2014 FINAL  Final  Surgical PCR  screen     Status: None   Collection Time: 01/27/14 10:56 PM  Result Value Ref Range Status   MRSA, PCR NEGATIVE NEGATIVE Final   Staphylococcus aureus NEGATIVE NEGATIVE Final    Comment:        The Xpert SA Assay (FDA approved for NASAL specimens in patients over 31 years of age), is one component of a comprehensive surveillance program.  Test performance has been validated by [employer] for patients greater than or equal to 92 year old. It is not intended to diagnose infection nor to guide or monitor treatment.  Wound culture     Status: None   Collection Time: 01/28/14  3:29 PM  Result Value Ref Range Status   Specimen Description WOUND LEFT TOE  Final   Special Requests POF ZISTN AND VANCOMYCIN  Final   Gram Stain   Final    FEW WBC PRESENT,BOTH PMN AND MONONUCLEAR NO SQUAMOUS EPITHELIAL CELLS SEEN NO ORGANISMS SEEN Performed at [facility]    Culture   Final    FEW MICROAEROPHILIC STREPTOCOCCI Note: Standardized susceptibility testing for this organism is not available. Performed at [facility]    Report Status 01/30/2014 FINAL  Final  Anaerobic culture     Status: None (Preliminary result)   Collection Time: 01/28/14  3:29 PM  Result Value Ref Range Status   Specimen Description WOUND LEFT TOE  Final   Special Requests POF ZOSYN AND VANCOMYCIN  Final   Gram Stain   Final    FEW WBC PRESENT,BOTH PMN AND MONONUCLEAR NO SQUAMOUS EPITHELIAL CELLS SEEN NO ORGANISMS SEEN Performed at [facility]   Culture   Final    NO ANAEROBES ISOLATED; CULTURE IN PROGRESS FOR 5 DAYS Performed at [facility]   Report Status PENDING  Incomplete     Studies: No results found.  Scheduled Meds: .  amLODipine  10 mg Oral Daily  . aspirin  81 mg Oral Daily  . docusate sodium  100 mg Oral Daily  . enoxaparin (LOVENOX) injection  30 mg Subcutaneous Q24H  . feeding supplement (GLUCERNA SHAKE)  237 mL Oral BID BM  . feeding supplement (PRO-STAT SUGAR FREE 64)  30 mL Oral BID BM  . insulin aspart  0-20 Units Subcutaneous TID WC  . insulin aspart  0-5 Units Subcutaneous QHS  . insulin glargine  30 Units Subcutaneous BID  . isosorbide mononitrate  30 mg Oral Daily  . metoprolol succinate  100 mg Oral BID  . pantoprazole  40 mg Oral Daily  . piperacillin-tazobactam (ZOSYN)  IV  3.375 g Intravenous Q8H  . rosuvastatin  40 mg Oral Daily  . vancomycin  1,500 mg Intravenous Q24H   Continuous Infusions:     Charlynne Cousins  [facility] Pager [phone]. If 8PM-8AM, please contact night-coverage at [email or web address], password [facility] 01/30/2014, 10:53 AM  LOS: 6 days

## 2014-01-30 NOTE — Plan of Care (Signed)
Problem: Phase II Progression Outcomes Goal: Foley discontinued Outcome: Completed/Met Date Met:  01/30/14

## 2014-01-30 NOTE — Progress Notes (Signed)
Pt complained of dry mouth. Biotene (dry mouth solution) provided. Pt tolerated well.

## 2014-01-31 ENCOUNTER — Ambulatory Visit: Payer: Medicare PPO

## 2014-01-31 ENCOUNTER — Encounter (HOSPITAL_COMMUNITY): Payer: Self-pay | Admitting: Vascular Surgery

## 2014-01-31 DIAGNOSIS — L97529 Non-pressure chronic ulcer of other part of left foot with unspecified severity: Secondary | ICD-10-CM

## 2014-01-31 DIAGNOSIS — Z48812 Encounter for surgical aftercare following surgery on the circulatory system: Secondary | ICD-10-CM

## 2014-01-31 DIAGNOSIS — E1169 Type 2 diabetes mellitus with other specified complication: Secondary | ICD-10-CM | POA: Insufficient documentation

## 2014-01-31 DIAGNOSIS — E08621 Diabetes mellitus due to underlying condition with foot ulcer: Secondary | ICD-10-CM | POA: Insufficient documentation

## 2014-01-31 DIAGNOSIS — E1122 Type 2 diabetes mellitus with diabetic chronic kidney disease: Secondary | ICD-10-CM | POA: Insufficient documentation

## 2014-01-31 LAB — BASIC METABOLIC PANEL
Anion gap: 13 (ref 5–15)
BUN: 27 mg/dL — ABNORMAL HIGH (ref 6–23)
CO2: 23 mEq/L (ref 19–32)
Calcium: 8 mg/dL — ABNORMAL LOW (ref 8.4–10.5)
Chloride: 110 mEq/L (ref 96–112)
Creatinine, Ser: 2.06 mg/dL — ABNORMAL HIGH (ref 0.50–1.35)
GFR calc Af Amer: 35 mL/min — ABNORMAL LOW (ref >90)
GFR calc non Af Amer: 30 mL/min — ABNORMAL LOW (ref >90)
Glucose, Bld: 173 mg/dL — ABNORMAL HIGH (ref 70–99)
Potassium: 3.8 mEq/L (ref 3.7–5.3)
Sodium: 146 mEq/L (ref 137–147)

## 2014-01-31 LAB — APTT: aPTT: 30 seconds (ref 24–37)

## 2014-01-31 LAB — GLUCOSE, CAPILLARY
Glucose-Capillary: 190 mg/dL — ABNORMAL HIGH (ref 70–99)
Glucose-Capillary: 223 mg/dL — ABNORMAL HIGH (ref 70–99)
Glucose-Capillary: 283 mg/dL — ABNORMAL HIGH (ref 70–99)
Glucose-Capillary: 233 mg/dL — ABNORMAL HIGH (ref 70–99)

## 2014-01-31 LAB — VANCOMYCIN, TROUGH: Vancomycin Tr: 19.8 ug/mL (ref 10.0–20.0)

## 2014-01-31 MED ORDER — CEFAZOLIN SODIUM-DEXTROSE 2-3 GM-% IV SOLR
2.0000 g | INTRAVENOUS | Status: AC
  Administered 2014-02-01: 2 g via INTRAVENOUS
  Filled 2014-01-31: qty 50

## 2014-01-31 MED ORDER — VANCOMYCIN HCL IN DEXTROSE 1-5 GM/200ML-% IV SOLN
1000.0000 mg | INTRAVENOUS | Status: DC
  Administered 2014-01-31 – 2014-02-03 (×3): 1000 mg via INTRAVENOUS
  Filled 2014-01-31 (×4): qty 200

## 2014-01-31 MED ORDER — CHLORHEXIDINE GLUCONATE 4 % EX LIQD
60.0000 mL | Freq: Once | CUTANEOUS | Status: DC
  Filled 2014-01-31: qty 60

## 2014-01-31 MED ORDER — INSULIN GLARGINE 100 UNIT/ML ~~LOC~~ SOLN: 40.0000 [IU] | Freq: Two times a day (BID) | SUBCUTANEOUS | Status: DC

## 2014-01-31 MED ORDER — INSULIN GLARGINE 100 UNIT/ML ~~LOC~~ SOLN
35.0000 [IU] | Freq: Two times a day (BID) | SUBCUTANEOUS | Status: DC
  Administered 2014-01-31 – 2014-02-01 (×2): 35 [IU] via SUBCUTANEOUS
  Filled 2014-01-31 (×3): qty 0.35

## 2014-01-31 NOTE — Progress Notes (Signed)
VASCULAR LAB PRELIMINARY  ARTERIAL  ABI completed:    RIGHT    LEFT    PRESSURE WAVEFORM  PRESSURE WAVEFORM  BRACHIAL 158 T BRACHIAL 159 T  DP 119 M DP 127 DM  AT   AT    PT 167 M PT  absent  PER   PER 153 DM  GREAT TOE  NA GREAT TOE  NA    RIGHT LEFT  ABI >1 0.96     Bianco Cange, RVT 01/31/2014, 3:06 PM

## 2014-01-31 NOTE — Progress Notes (Signed)
ANTIBIOTIC CONSULT NOTE - FOLLOW UP  Pharmacy Consult for vancomycin Indication: cellulitis  Allergies  Allergen Reactions  . Cilostazol Other (See Comments)    Feels like feet on fire    Patient Measurements: Height: 5\' 8"  (172.7 cm) Weight: 238 lb (107.956 kg) IBW/kg (Calculated) : 68.4 Adjusted Body Weight:   Vital Signs: Temp: 99.2 F (37.3 C) (11/02 2130) Temp Source: Oral (11/02 2108) BP: 154/66 mmHg (11/02 2130) Pulse Rate: 77 (11/02 2130) Intake/Output from previous day: 11/01 0701 - 11/02 0700 In: 1580 [P.O.:1080; IV Piggyback:500] Out: 2352 [Urine:2350; Stool:2] Intake/Output from this shift:    Labs:  Recent Labs  01/29/14 0354 01/31/14 0520  WBC 17.9*  --   HGB 8.5*  --   PLT 406*  --   CREATININE 1.69* 2.06*   Estimated Creatinine Clearance: 38 mL/min (by C-G formula based on Cr of 2.06).  Recent Labs  01/31/14 2200  VANCOTROUGH 19.8     Microbiology: Recent Results (from the past 720 hour(s))  Culture, blood (routine x 2)     Status: None   Collection Time: 01/24/14  5:35 AM  Result Value Ref Range Status   Specimen Description BLOOD LEFT ARM  Final   Special Requests BOTTLES DRAWN AEROBIC ONLY 5CC  Final   Culture  Setup Time   Final    01/24/2014 09:47 Performed at [facility]    Culture   Final    NO GROWTH 5 DAYS Performed at [facility]    Report Status 01/30/2014 FINAL  Final  Culture, blood (routine x 2)     Status: None   Collection Time: 01/24/14  5:45 AM  Result Value Ref Range Status   Specimen Description BLOOD LEFT ARM  Final   Special Requests BOTTLES DRAWN AEROBIC AND ANAEROBIC 5CC EACH  Final   Culture  Setup Time   Final    01/24/2014 09:47 Performed at [facility]    Culture   Final    NO GROWTH 5 DAYS Performed at [facility]    Report Status 01/30/2014 FINAL  Final  Clostridium Difficile by PCR     Status: None   Collection Time: 01/24/14  8:07 PM  Result Value Ref  Range Status   C difficile by pcr NEGATIVE NEGATIVE Final  Stool culture     Status: None   Collection Time: 01/24/14  8:07 PM  Result Value Ref Range Status   Specimen Description STOOL  Final   Special Requests NONE  Final   Culture   Final    NO SALMONELLA, SHIGELLA, CAMPYLOBACTER, YERSINIA, OR E.COLI 0157:H7 ISOLATED Performed at [facility]   Report Status 01/28/2014 FINAL  Final  Surgical PCR screen     Status: None   Collection Time: 01/27/14 10:56 PM  Result Value Ref Range Status   MRSA, PCR NEGATIVE NEGATIVE Final   Staphylococcus aureus NEGATIVE NEGATIVE Final    Comment:        The Xpert SA Assay (FDA approved for NASAL specimens in patients over 73 years of age), is one component of a comprehensive surveillance program.  Test performance has been validated by [facility] for patients greater than or equal to 73 year old. It is not intended to diagnose infection nor to guide or monitor treatment.  Wound culture     Status: None   Collection Time: 01/28/14  3:29 PM  Result Value Ref Range Status   Specimen Description WOUND LEFT TOE  Final  Special Requests POF ZISTN AND VANCOMYCIN  Final   Gram Stain   Final    FEW WBC PRESENT,BOTH PMN AND MONONUCLEAR NO SQUAMOUS EPITHELIAL CELLS SEEN NO ORGANISMS SEEN Performed at [facility]    Culture   Final    FEW MICROAEROPHILIC STREPTOCOCCI Note: Standardized susceptibility testing for this organism is not available. Performed at [facility]    Report Status 01/30/2014 FINAL  Final  Anaerobic culture     Status: None (Preliminary result)   Collection Time: 01/28/14  3:29 PM  Result Value Ref Range Status   Specimen Description WOUND LEFT TOE  Final   Special Requests POF ZOSYN AND VANCOMYCIN  Final   Gram Stain   Final    FEW WBC PRESENT,BOTH PMN AND MONONUCLEAR NO SQUAMOUS EPITHELIAL CELLS SEEN NO ORGANISMS SEEN Performed at [facility]   Culture   Final    NO ANAEROBES  ISOLATED; CULTURE IN PROGRESS FOR 5 DAYS Performed at [facility]   Report Status PENDING  Incomplete    Anti-infectives    Start     Dose/Rate Route Frequency Ordered Stop   02/01/14 0600  ceFAZolin (ANCEF) IVPB 2 g/50 mL premix     2 g100 mL/hr over 30 Minutes Intravenous On call to O.R. 01/31/14 1845 02/02/14 0559   01/31/14 2330  vancomycin (VANCOCIN) IVPB 1000 mg/200 mL premix     1,000 mg200 mL/hr over 60 Minutes Intravenous Every 24 hours 01/31/14 2300     01/24/14 2200  vancomycin (VANCOCIN) 1,500 mg in sodium chloride 0.9 % 500 mL IVPB  Status:  Discontinued     1,500 mg250 mL/hr over 120 Minutes Intravenous Every 24 hours 01/24/14 0232 01/31/14 2259   01/24/14 0800  piperacillin-tazobactam (ZOSYN) IVPB 3.375 g     3.375 g12.5 mL/hr over 240 Minutes Intravenous Every 8 hours 01/24/14 0232     01/24/14 0245  piperacillin-tazobactam (ZOSYN) IVPB 3.375 g     3.375 g100 mL/hr over 30 Minutes Intravenous  Once 01/24/14 0232 01/24/14 0336      Assessment: Checked a vanc trough since s.cr. Has been going up. Trough was 19.8. Goal was 10-15 for cellulitis.  CrCl _ 38 ml/min  Goal of Therapy:  Vancomycin trough level 10-15 mcg/ml  Plan:  Change vancomycin dose to Vancomycin 1 Gm IV q24 hrs.  Eugene Garnetotter, Caydance Kuehnle Sue 01/31/2014,11:04 PM

## 2014-01-31 NOTE — Care Management Note (Signed)
CARE MANAGEMENT NOTE 01/31/2014  Patient:  Jordan [name], Jordan [name]   Account Number:  [id number]  Date Initiated:  01/31/2014  Documentation initiated by:  Jasmine Pang  Subjective/Objective Assessment:   CM following for progression and d/c planning.     Action/Plan:   01/31/2014 Met with pt wife re d/c needs. Pt will need RW and 3:1 per PT recommendation, orders placed. Await MD order for [facility] services.   Anticipated DC Date:  02/01/2014   Anticipated DC Plan:  [place]         Choice offered to / List presented to:  C-3 Spouse   DME arranged  3-N-1  Vassie Moselle      DME agency  [place]        Status of service:   Medicare Important Message given?  YES (If response is "NO", the following Medicare IM given date fields will be blank) Date Medicare IM given:  01/31/2014 Medicare IM given by:  Caya Soberanis Date Additional Medicare IM given:   Additional Medicare IM given by:    Discharge Disposition:  [place]  Per UR Regulation:    If discussed at Long Length of Stay Meetings, dates discussed:    Comments:

## 2014-01-31 NOTE — Progress Notes (Signed)
Patient ID: Jordan [name], Jordan [name]   DOB: 11-29-1940, 73 y.o.   MRN: [id number] Patient is status post revascularization to the left lower extremity. Ankle-brachial indices shows excellent improvement in his macro circulation. Patient is status post fifth ray amputation with necrotic wound bed. I will plan on revision surgery with possible fourth ray amputation and possible transmetatarsal amputation. Possible wound VAC placement. We will plan on surgery for Wednesday.

## 2014-01-31 NOTE — Progress Notes (Signed)
NUTRITION FOLLOW UP  DOCUMENTATION CODES Per approved criteria  -Obesity Unspecified   INTERVENTION: Continue Glucerna Shake po BID, each supplement provides 220 kcal and 10 grams of protein.  Continue 30 ml Prostat po BID, each supplement provides 100 kcal and 15 grams of protein.  NUTRITION DIAGNOSIS: Increased nutrient needs related to diabetic ulcer as evidenced by estimated nutrition needs; ongoing  Goal: Pt to meet >/= 90% of their estimated nutrition needs; met  Monitor:  PO intake, weight trends, labs, I/O's  Reason for Assessment: MST  73 y.o. male  Admitting Dx: Diabetic foot ulcer  ASSESSMENT: Pt with coronary artery disease, carotid stenosis, type 2 diabetes, hypertension, hyperlipidemia, and peripheral vascular disease. Patient reports that about 2 weeks ago, 01/10/2014 he had a diabetic foot ulcer. Subsequently since then he has had daily episode of diarrhea which is watery and loose. The ulcer in his left plantar foot has not healed properly. In the last 24 hours he has been having fevers and chills.  PROCEDURE 10/30: 1. Left common femoral artery to anterior tibial artery bypass with composite PTFE left greater saphenous vein graft 2. Open ray amputation of the left fifth toe  Meal completion is 50-100%. Pt reports his appetite is improving.  Pt reports he has been drinking his Glucerna Shake and taking his Prostat. Will continue with current orders. Pt was encouraged to eat his food at meals and to consume his supplements.   Labs: Low calcium and GFR. High BUN and creatinine.   Height: Ht Readings from Last 1 Encounters:  01/30/14 _0  (1.727 m)    Weight: Wt Readings from Last 1 Encounters:  01/30/14 238 lb (107.956 kg)    BMI:  Body mass index is 36.2 kg/(m^2). Class II obesity  Re-Estimated Nutritional Needs: Kcal: 2000-2250 Protein: 110-125 grams Fluid: 2 - 2.25 L/day  Skin: diabetic foot ulcer, incision left leg and foot, +1 LLE  edema  Diet Order: Diet Carb Modified  Intake/Output Summary (Last 24 hours) at 01/31/14 1522 Last data filed at 01/31/14 0600  Gross per 24 hour  Intake   1100 ml  Output    450 ml  Net    650 ml    Last BM: 11/1  Labs:   Recent Labs Lab 01/28/14 0450  01/28/14 1436 01/29/14 0354 01/31/14 0520  NA 140  < > 140 141 146  K 4.1  < > 4.5 4.5 3.8  CL 103  --   --  104 110  CO2 23  --   --  23 23  BUN 15  --   --  21 27*  CREATININE 1.69*  --   --  1.69* 2.06*  CALCIUM 8.6  --   --  7.9* 8.0*  GLUCOSE 180*  < > 220* 225* 173*  < > = values in this interval not displayed.  CBG (last 3)   Recent Labs  01/30/14 2139 01/31/14 0807 01/31/14 1248  GLUCAP 254* 190* 223*    Scheduled Meds: . amLODipine  10 mg Oral Daily  . aspirin  81 mg Oral Daily  . docusate sodium  100 mg Oral Daily  . enoxaparin (LOVENOX) injection  40 mg Subcutaneous Q24H  . feeding supplement (GLUCERNA SHAKE)  237 mL Oral BID BM  . feeding supplement (PRO-STAT SUGAR FREE 64)  30 mL Oral BID BM  . insulin aspart  0-20 Units Subcutaneous TID WC  . insulin aspart  0-5 Units Subcutaneous QHS  . insulin glargine  35 Units  Subcutaneous BID  . isosorbide mononitrate  30 mg Oral Daily  . metoprolol succinate  100 mg Oral BID  . pantoprazole  40 mg Oral Daily  . piperacillin-tazobactam (ZOSYN)  IV  3.375 g Intravenous Q8H  . rosuvastatin  40 mg Oral Daily  . vancomycin  1,500 mg Intravenous Q24H    Continuous Infusions:    Past Medical History  Diagnosis Date  . CAD (coronary artery disease)     a. s/p IMI in past tx with POBA and cath 6 mos later with occluded RCA;  b. h/o Taxus DES to LAD and CFX;  c.  cath 1/10: LM ok, mLAD 40-50%, LAD stent ok, D2 tandem 60-70%, pCFX 30%, mAVCFX stent ok, pRCA 40%, RV 90%, RV marginal 90%, dRCA filled L-R collats tx medically  . Carotid stenosis     dopplers 5/11: 40-59% bilat  . DM2 (diabetes mellitus, type 2)   . HTN (hypertension)   . HLD  (hyperlipidemia)   . Diabetes mellitus     Past Surgical History  Procedure Laterality Date  . Cardiac catheterization    . Femoral-tibial bypass graft Left 01/28/2014    Procedure:  FEMORAL-ANTERIOR TIBIAL ARTERY Bypass Graft utilizing composite gortex graft and vein graft;  Surgeon: Angelia Mould, MD;  Location: [place];  Service: Vascular;  Laterality: Left;  . Amputation Left 01/28/2014    Procedure: AMPUTATION DIGIT- LEFT 5TH TOE;  Surgeon: Angelia Mould, MD;  Location: [place];  Service: Vascular;  Laterality: Left;    Kallie Locks, MS, RD, LDN Pager # [phone] After hours/ weekend pager # [phone]

## 2014-01-31 NOTE — Progress Notes (Signed)
Physical Therapy Treatment Patient Details Name: Jordan [name] MRN: [id number] DOB: 1941-02-26 Today's Date: 01/31/2014    History of Present Illness Jordan [name] is a 73 y.o. 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 PMH of DM, HTN, coronary atherosclerosis, CVD, PVD, PAD, diabetic osteomyelitis, CKD stage III, leukocytosis, and gangrene.     PT Comments    Pt progressing towards physical therapy goals. Pt was able to perform transfers and ambulation well with no reports of increased pain in L foot. Discussed LE therapeutic exercise to perform between therapy sessions and wrote on white board for reminders. Pt and wife appear pleased with his progress. Will continue to follow and progress as able per POC.   Follow Up Recommendations  Home health PT;Supervision/Assistance - 24 hour     Equipment Recommendations  Rolling walker with 5" wheels    Recommendations for Other Services       Precautions / Restrictions Precautions Precautions: Fall Restrictions Weight Bearing Restrictions: No    Mobility  Bed Mobility               General bed mobility comments: Pt received sitting up in recliner.   Transfers Overall transfer level: Needs assistance Equipment used: Rolling walker (2 wheeled) Transfers: Sit to/from Stand Sit to Stand: Min guard         General transfer comment: Pt able to power-up to full standing without physical assistance. VC's for hand placement on seated surface for controlled descent to the chair.   Ambulation/Gait Ambulation/Gait assistance: Min guard Ambulation Distance (Feet): 125 Feet Assistive device: Rolling walker (2 wheeled) Gait Pattern/deviations: Step-through pattern;Decreased stride length;Trunk flexed;Decreased step length - right;Decreased stance time - left Gait velocity:  Decreased Gait velocity interpretation: Below normal speed for age/gender General Gait Details: VC's for looking up and for general improved posture. Pt states he has no increase in pain on the LLE.    Stairs            Wheelchair Mobility    Modified Rankin (Stroke Patients Only)       Balance Overall balance assessment: Needs assistance Sitting-balance support: Feet supported;No upper extremity supported Sitting balance-Leahy Scale: Good     Standing balance support: Bilateral upper extremity supported;During functional activity Standing balance-Leahy Scale: Fair Standing balance comment: Feel that pt can stand without UE support, however for dynamic activity would recommend at least single extremity support.                     Cognition Arousal/Alertness: Awake/alert Behavior During Therapy: WFL for tasks assessed/performed Overall Cognitive Status: Within Functional Limits for tasks assessed                      Exercises General Exercises - Lower Extremity Ankle Circles/Pumps: 15 reps Quad Sets: 10 reps Long Arc Quad: 10 reps Hip ABduction/ADduction: 10 reps    General Comments        Pertinent Vitals/Pain Pain Assessment: No/denies pain    Home Living                      Prior Function            PT Goals (current goals can now be found in the care plan section) Acute Rehab PT Goals Patient Stated Goal: to move better PT Goal  Formulation: With patient Time For Goal Achievement: 02/05/14 Potential to Achieve Goals: Good Progress towards PT goals: Progressing toward goals    Frequency  Min 3X/week    PT Plan Current plan remains appropriate    Co-evaluation             End of Session Equipment Utilized During Treatment: Gait belt Activity Tolerance: Patient tolerated treatment well Patient left: in chair;with call bell/phone within reach;with family/visitor present     Time: [id number] PT Time  Calculation (min): 40 min  Charges:  $Gait Training: 8-22 mins $Therapeutic Exercise: 8-22 mins $Therapeutic Activity: 8-22 mins                    G Codes:      Conni SlipperKirkman, Monserat Prestigiacomo 01/31/2014, 3:58 PM  Conni SlipperLaura Shunna Mikaelian, PT, DPT Acute Rehabilitation Services Pager: [phone]

## 2014-01-31 NOTE — Progress Notes (Signed)
[facility] PROGRESS NOTE Interim History: 73 y.o. male who presents with non healing wound to left foot.He was started on Keflex ~ 2 weeks ago.he developed worsening fever and chills and presented to outside hospital and was then transferred to [facility]. Started on Vanc/Zosyn. Consult ortho who rec vascular surgery consult, who recommended cardiology Dr. Fletcher Anon who rec angiogram on 10.28.2015, agree with proceeding with bypass and amputation.   Assessment/Plan: Gangrene left foot/ sepsis - PVD:  - Started on  Zosyn and Vanc 10.28.2015 consulted cardiology eval- ABI < 0.5 B/L - Angiogram on 10.28.2015 showed Flush occlusion of the left SFA  cardiology recommended surgical intervention - Amputation of the left fifth toe with Left common femoral artery to anterior tibial artery gortex bypass with composite PTFE left greater saphenous vein graft on 10.30.2015. Dr Sharol Given to see pt for further evaluation and possible debridment. - will de-escalate to cipro and doxy in am for 2 weeks. - pt eval rec Home health PT.  Diabetes Mellitus 2, insulin requiring  - Increase Lantuss back to 40 and cont Novolog-  - BG cont to improve. - A1c 8.9 BG poorly controlled. Will need aggressive treatment when ready to go home  Diarrhea  - due to antibiotics - c diff negative. - now resolved.  Essential hypertension  - cont Imdur, Norvasc, Toprol   Coronary atherosclerosis  Cont ASA   Peripheral vascular disease  - cont ASA- vascular sx consulted. - see above for further details.  CKD (chronic kidney disease), stage III  - Cr increased stabilize. - will start slow IVF and follow B met   Code Status: Full code  Family Communication: none  Disposition Plan: to be determined  DVT prophylaxis: Lovenox    Consultants:  Ortho  Vascular  cardio  Procedures:  Angiogram 10.287.2015  Antibiotics:  vanc zosyn  HPI/Subjective: No compalins  Objective: Filed Vitals:   01/30/14  1000 01/30/14 1815 01/30/14 2141 01/31/14 0548  BP: 139/53 147/72 158/64 102/85  Pulse: 74 83 79 79  Temp: 98.2 F (36.8 C) 98.2 F (36.8 C) 98 F (36.7 C) 98.4 F (36.9 C)  TempSrc: Oral Oral Oral Oral  Resp: '18 18 18 17  ' Height:   '5\' 8"'  (1.727 m)   Weight:   107.956 kg (238 lb)   SpO2: 98% 98% 97% 98%    Intake/Output Summary (Last 24 hours) at 01/31/14 1126 Last data filed at 01/31/14 0600  Gross per 24 hour  Intake   1340 ml  Output    851 ml  Net    489 ml   Filed Weights   01/29/14 2111 01/30/14 0645 01/30/14 2141  Weight: 110.542 kg (243 lb 11.2 oz) 110.542 kg (243 lb 11.2 oz) 107.956 kg (238 lb)    Exam:  General: Alert, awake, oriented x3, in no acute distress.  HEENT: No bruits, no goiter.  Heart: Regular rate and rhythm. Lungs: Good air movement, clear Abdomen: Soft, nontender, nondistended.    Data Reviewed: Basic Metabolic Panel:  Recent Labs Lab 01/26/14 0638 01/27/14 0602 01/28/14 0450 01/28/14 1228 01/28/14 1436 01/29/14 0354 01/31/14 0520  NA 137 137 140 139 140 141 146  K 3.9 3.9 4.1 4.5 4.5 4.5 3.8  CL 99 98 103  --   --  104 110  CO2 '23 22 23  ' --   --  23 23  GLUCOSE 150* 156* 180* 202* 220* 225* 173*  BUN '20 19 15  ' --   --  21 27*  CREATININE 1.77* 1.79* 1.69*  --   --  1.69* 2.06*  CALCIUM 8.8 9.1 8.6  --   --  7.9* 8.0*   Liver Function Tests: No results for input(s): AST, ALT, ALKPHOS, BILITOT, PROT, ALBUMIN in the last 168 hours. No results for input(s): LIPASE, AMYLASE in the last 168 hours. No results for input(s): AMMONIA in the last 168 hours. CBC:  Recent Labs Lab 01/25/14 0528 01/28/14 0450 01/28/14 1228 01/28/14 1436 01/29/14 0354  WBC 15.4* 19.4*  --   --  17.9*  HGB 10.1* 9.8* 9.2* 9.2* 8.5*  HCT 30.4* 29.7* 27.0* 27.0* 25.9*  MCV 83.7 82.0  --   --  83.0  PLT 333 399  --   --  406*   Cardiac Enzymes: No results for input(s): CKTOTAL, CKMB, CKMBINDEX, TROPONINI in the last 168 hours. BNP (last 3  results) No results for input(s): PROBNP in the last 8760 hours. CBG:  Recent Labs Lab 01/30/14 0742 01/30/14 1139 01/30/14 1612 01/30/14 2139 01/31/14 0807  GLUCAP 159* 247* 252* 254* 190*    Recent Results (from the past 240 hour(s))  Culture, blood (routine x 2)     Status: None   Collection Time: 01/24/14  5:35 AM  Result Value Ref Range Status   Specimen Description BLOOD LEFT ARM  Final   Special Requests BOTTLES DRAWN AEROBIC ONLY 5CC  Final   Culture  Setup Time   Final    01/24/2014 09:47 Performed at [place]   Final    NO GROWTH 5 DAYS Performed at [facility]    Report Status 01/30/2014 FINAL  Final  Culture, blood (routine x 2)     Status: None   Collection Time: 01/24/14  5:45 AM  Result Value Ref Range Status   Specimen Description BLOOD LEFT ARM  Final   Special Requests BOTTLES DRAWN AEROBIC AND ANAEROBIC 5CC EACH  Final   Culture  Setup Time   Final    01/24/2014 09:47 Performed at [place]   Final    NO GROWTH 5 DAYS Performed at [facility]    Report Status 01/30/2014 FINAL  Final  Clostridium Difficile by PCR     Status: None   Collection Time: 01/24/14  8:07 PM  Result Value Ref Range Status   C difficile by pcr NEGATIVE NEGATIVE Final  Stool culture     Status: None   Collection Time: 01/24/14  8:07 PM  Result Value Ref Range Status   Specimen Description STOOL  Final   Special Requests NONE  Final   Culture   Final    NO SALMONELLA, SHIGELLA, CAMPYLOBACTER, YERSINIA, OR E.COLI 0157:H7 ISOLATED Performed at [facility]   Report Status 01/28/2014 FINAL  Final  Surgical PCR screen     Status: None   Collection Time: 01/27/14 10:56 PM  Result Value Ref Range Status   MRSA, PCR NEGATIVE NEGATIVE Final   Staphylococcus aureus NEGATIVE NEGATIVE Final    Comment:        The Xpert SA Assay (FDA approved for NASAL specimens in patients over 58 years of age), is one  component of a comprehensive surveillance program.  Test performance has been validated by [employer] for patients greater than or equal to 78 year old. It is not intended to diagnose infection nor to guide or monitor treatment.  Wound culture     Status: None   Collection Time:  01/28/14  3:29 PM  Result Value Ref Range Status   Specimen Description WOUND LEFT TOE  Final   Special Requests POF ZISTN AND VANCOMYCIN  Final   Gram Stain   Final    FEW WBC PRESENT,BOTH PMN AND MONONUCLEAR NO SQUAMOUS EPITHELIAL CELLS SEEN NO ORGANISMS SEEN Performed at [facility]    Culture   Final    FEW MICROAEROPHILIC STREPTOCOCCI Note: Standardized susceptibility testing for this organism is not available. Performed at [facility]    Report Status 01/30/2014 FINAL  Final  Anaerobic culture     Status: None (Preliminary result)   Collection Time: 01/28/14  3:29 PM  Result Value Ref Range Status   Specimen Description WOUND LEFT TOE  Final   Special Requests POF ZOSYN AND VANCOMYCIN  Final   Gram Stain   Final    FEW WBC PRESENT,BOTH PMN AND MONONUCLEAR NO SQUAMOUS EPITHELIAL CELLS SEEN NO ORGANISMS SEEN Performed at [facility]   Culture   Final    NO ANAEROBES ISOLATED; CULTURE IN PROGRESS FOR 5 DAYS Performed at [facility]   Report Status PENDING  Incomplete     Studies: No results found.  Scheduled Meds: . amLODipine  10 mg Oral Daily  . aspirin  81 mg Oral Daily  . docusate sodium  100 mg Oral Daily  . enoxaparin (LOVENOX) injection  40 mg Subcutaneous Q24H  . feeding supplement (GLUCERNA SHAKE)  237 mL Oral BID BM  . feeding supplement (PRO-STAT SUGAR FREE 64)  30 mL Oral BID BM  . insulin aspart  0-20 Units Subcutaneous TID WC  . insulin aspart  0-5 Units Subcutaneous QHS  . insulin glargine  30 Units Subcutaneous BID  . isosorbide mononitrate  30 mg Oral Daily  . metoprolol succinate  100 mg Oral BID  . pantoprazole  40 mg Oral  Daily  . piperacillin-tazobactam (ZOSYN)  IV  3.375 g Intravenous Q8H  . rosuvastatin  40 mg Oral Daily  . vancomycin  1,500 mg Intravenous Q24H   Continuous Infusions:     Charlynne Cousins  [facility] Pager [phone]. If 8PM-8AM, please contact night-coverage at [email or web address], password [facility] 01/31/2014, 11:26 AM  LOS: 7 days

## 2014-01-31 NOTE — Progress Notes (Signed)
   VASCULAR SURGERY ASSESSMENT & PLAN:  * 3 Days Post-Op s/p: left femoral to anterior tibial artery bypass with composite PTFE vein graft. Graft is patent.   *  Left foot wound with some devitalized tissue but overall looks potentially salvageable. I will ask Dr. Lajoyce Cornersuda to continue to manage the left foot wound.  * Intraoperative culture shows FEW MICROAEROPHILIC STREPTOCOCCI. The patient continues vancomycin and Zosyn as per primary service.  SUBJECTIVE: no specific complaints.  PHYSICAL EXAM: Filed Vitals:   01/30/14 1000 01/30/14 1815 01/30/14 2141 01/31/14 0548  BP: 139/53 147/72 158/64 102/85  Pulse: 74 83 79 79  Temp: 98.2 F (36.8 C) 98.2 F (36.8 C) 98 F (36.7 C) 98.4 F (36.9 C)  TempSrc: Oral Oral Oral Oral  Resp: 18 18 18 17   Height:   5\' 8"  (1.727 m)   Weight:   238 lb (107.956 kg)   SpO2: 98% 98% 97% 98%   All of his incisions look fine. Palpable graft pulse laterally. I inspected his foot wound. There is some devitalized tissue but overall there is reasonable granulation in some areas.  LABS: Lab Results  Component Value Date   WBC 17.9* 01/29/2014   HGB 8.5* 01/29/2014   HCT 25.9* 01/29/2014   MCV 83.0 01/29/2014   PLT 406* 01/29/2014   Lab Results  Component Value Date   CREATININE 2.06* 01/31/2014   Lab Results  Component Value Date   INR 1.33 01/24/2014   CBG (last 3)   Recent Labs  01/30/14 1612 01/30/14 2139 01/31/14 0807  GLUCAP 252* 254* 190*    Principal Problem:   Diabetic foot ulcer Active Problems:   Diabetes   Hyperlipidemia   Essential hypertension   Coronary atherosclerosis   Cerebrovascular disease   Peripheral vascular disease   PAD (peripheral artery disease)   Diabetic osteomyelitis   Acute renal failure   CKD (chronic kidney disease), stage III   Leukocytosis   Fever presenting with conditions classified elsewhere   Diarrhea   Gangrene   Diabetic ulcer of left foot associated with diabetes mellitus due to  underlying condition   Type 2 diabetes mellitus with diabetic chronic kidney disease   Cari CarawayChris Yousaf Sainato Beeper: [phone] 01/31/2014

## 2014-02-01 ENCOUNTER — Other Ambulatory Visit (HOSPITAL_COMMUNITY): Payer: Self-pay | Admitting: Orthopedic Surgery

## 2014-02-01 DIAGNOSIS — L97509 Non-pressure chronic ulcer of other part of unspecified foot with unspecified severity: Secondary | ICD-10-CM

## 2014-02-01 LAB — GLUCOSE, CAPILLARY
Glucose-Capillary: 183 mg/dL — ABNORMAL HIGH (ref 70–99)
Glucose-Capillary: 217 mg/dL — ABNORMAL HIGH (ref 70–99)
Glucose-Capillary: 270 mg/dL — ABNORMAL HIGH (ref 70–99)
Glucose-Capillary: 225 mg/dL — ABNORMAL HIGH (ref 70–99)

## 2014-02-01 LAB — TYPE AND SCREEN
ABO/RH(D): B POS
Antibody Screen: NEGATIVE
Unit division: 0
Unit division: 0

## 2014-02-01 LAB — APTT: aPTT: 33 seconds (ref 24–37)

## 2014-02-01 LAB — SURGICAL PCR SCREEN
MRSA, PCR: NEGATIVE
Staphylococcus aureus: NEGATIVE

## 2014-02-01 MED ORDER — ISOSORBIDE MONONITRATE ER 60 MG PO TB24
60.0000 mg | ORAL_TABLET | Freq: Every day | ORAL | Status: DC
  Administered 2014-02-02 – 2014-02-07 (×6): 60 mg via ORAL
  Filled 2014-02-01 (×6): qty 1

## 2014-02-01 MED ORDER — INSULIN GLARGINE 100 UNIT/ML ~~LOC~~ SOLN
40.0000 [IU] | Freq: Two times a day (BID) | SUBCUTANEOUS | Status: DC
  Administered 2014-02-01 – 2014-02-05 (×8): 40 [IU] via SUBCUTANEOUS
  Filled 2014-02-01 (×10): qty 0.4

## 2014-02-01 NOTE — Plan of Care (Signed)
Problem: Phase I Progression Outcomes Goal: Initial discharge plan identified Outcome: Completed/Met Date Met:  02/01/14     

## 2014-02-01 NOTE — Progress Notes (Signed)
   VASCULAR SURGERY ASSESSMENT & PLAN:  * 4 Days Post-Op s/p: left femoral to anterior tibial artery bypass with composite PTFE vein graft. Graft is patent. ABI is 96% on the left.   *Dr Lajoyce Cornersuda plans on further work on the foot tomorrow.   * Intraoperative culture shows FEW MICROAEROPHILIC STREPTOCOCCI. The patient continues vancomycin and Zosyn as per primary service.  * Appreciate Nutrition teams help. Appetite is improving. He is getting Glucerna shakes and taking Prostat.   * On Lovenox for DVT prophylaxis.    SUBJECTIVE: No complaints  PHYSICAL EXAM: Filed Vitals:   01/31/14 2108 01/31/14 2130 02/01/14 0434 02/01/14 0528  BP:  154/66  132/58  Pulse:  77  73  Temp:  99.2 F (37.3 C)  99.1 F (37.3 C)  TempSrc: Oral   Oral  Resp:  18  18  Height:      Weight:   238 lb (107.956 kg)   SpO2:  99%  97%   Palpable graft pulse. Incisions look fine.    LABS: Lab Results  Component Value Date   WBC 17.9* 01/29/2014   HGB 8.5* 01/29/2014   HCT 25.9* 01/29/2014   MCV 83.0 01/29/2014   PLT 406* 01/29/2014   Lab Results  Component Value Date   CREATININE 2.06* 01/31/2014   Lab Results  Component Value Date   INR 1.33 01/24/2014   CBG (last 3)   Recent Labs  01/31/14 1248 01/31/14 1736 01/31/14 2246  GLUCAP 223* 283* 233*    Principal Problem:   Diabetic foot ulcer Active Problems:   Diabetes   Hyperlipidemia   Essential hypertension   Coronary atherosclerosis   Cerebrovascular disease   Peripheral vascular disease   PAD (peripheral artery disease)   Diabetic osteomyelitis   Acute renal failure   CKD (chronic kidney disease), stage III   Leukocytosis   Fever presenting with conditions classified elsewhere   Diarrhea   Gangrene   Diabetic ulcer of left foot associated with diabetes mellitus due to underlying condition   Type 2 diabetes mellitus with diabetic chronic kidney disease   Cari CarawayChris Abriel Geesey Beeper: [phone] 02/01/2014

## 2014-02-01 NOTE — Plan of Care (Signed)
Problem: Discharge Progression Outcomes Goal: Pain controlled with appropriate interventions Outcome: Completed/Met Date Met:  02/01/14  Problem: Phase II Progression Outcomes Goal: Pain controlled Outcome: Completed/Met Date Met:  02/01/14

## 2014-02-01 NOTE — Progress Notes (Signed)
Occupational Therapy Treatment Patient Details Name: Jordan [name] MRN: [id number] DOB: 1940/12/29 Today's Date: 02/01/2014    History of present illness Jordan [name] is a 73 y.o. 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 PMH of DM, HTN, coronary atherosclerosis, CVD, PVD, PAD, diabetic osteomyelitis, CKD stage III, leukocytosis, and gangrene.    OT comments  Pt decreased problem solving vs memory for use of call button to request assist.  Moves slowly with min guard assist. Tolerating standing for 3 tasks at the sink.  Pt is interested in AE for LB ADL.  Follow Up Recommendations  No OT follow up;Supervision/Assistance - 24 hour    Equipment Recommendations  3 in 1 bedside comode    Recommendations for Other Services      Precautions / Restrictions Precautions Precautions: Fall Restrictions Weight Bearing Restrictions: No       Mobility Bed Mobility                  Transfers Overall transfer level: Needs assistance Equipment used: Rolling walker (2 wheeled) Transfers: Sit to/from UGI CorporationStand;Stand Pivot Transfers Sit to Stand: Min guard Stand pivot transfers: Min guard       General transfer comment: verbal cues for hand placement, slow    Balance                                   ADL Overall ADL's : Needs assistance/impaired     Grooming: Min guard;Standing;Wash/dry hands;Oral care;Brushing hair               Lower Body Dressing: Moderate assistance;Sit to/from stand   Toilet Transfer: Min guard;Stand-pivot;BSC   Toileting- ArchitectClothing Manipulation and Hygiene: Min guard;Sit to/from stand Toileting - Clothing Manipulation Details (indicate cue type and reason): RW in front     Functional mobility during ADLs: Min guard;Rolling walker General ADL Comments: verbal cues for hand placement      Vision                     Perception     Praxis      Cognition   Behavior During Therapy: [facility] EndoscopyWFL for tasks assessed/performed Overall Cognitive Status: Within Functional Limits for tasks assessed       Memory:  (? memory)               Extremity/Trunk Assessment               Exercises     Shoulder Instructions       General Comments      Pertinent Vitals/ Pain       Pain Assessment: No/denies pain  Home Living                                          Prior Functioning/Environment              Frequency Min 2X/week     Progress Toward Goals  OT Goals(current goals can now be found in the care plan section)  Progress towards OT goals: Progressing toward goals  Acute Rehab OT Goals Patient Stated Goal: to move better Time For Goal Achievement: 02/13/14  Plan Discharge plan remains  appropriate    Co-evaluation                 End of Session     Activity Tolerance Patient limited by fatigue   Patient Left in chair;with call bell/phone within reach   Nurse Communication          Time: [id number] OT Time Calculation (min): 45 min  Charges: OT General Charges $OT Visit: 1 Procedure OT Treatments $Self Care/Home Management : 38-52 mins  Evern BioMayberry, Alp [place] Lynn 02/01/2014, 10:29 AM  [phone]

## 2014-02-01 NOTE — Progress Notes (Signed)
Inpatient Diabetes Program Recommendations  AACE/ADA: New Consensus Statement on Inpatient Glycemic Control (2013)  Target Ranges:  Prepandial:   less than 140 mg/dL      Peak postprandial:   less than 180 mg/dL (1-2 hours)      Critically ill patients:  140 - 180 mg/dL   Inpatient Diabetes Program Recommendations Insulin - Meal Coverage: consider adding Novolog 4 units TID with meals for postprandial elevations  Thank you  Piedad ClimesGina Brylen Wagar BSN, RN,CDE Inpatient Diabetes Coordinator [phone] (team pager)

## 2014-02-01 NOTE — Progress Notes (Signed)
[facility] PROGRESS NOTE Interim History: 73 y.o. male who presents with non healing wound to left foot.He was started on Keflex ~ 2 weeks ago.he developed worsening fever and chills and presented to outside hospital and was then transferred to [facility]. Started on Vanc/Zosyn. Consult ortho who rec vascular surgery consult, who recommended cardiology Dr. Fletcher Anon who rec angiogram on 10.28.2015, agree with proceeding with bypass and amputation.   Assessment/Plan: Gangrene left foot/ sepsis - PVD:  - Started on  Zosyn and Vanc 10.28.2015 consulted cardiology eval- ABI < 0.5 B/L - Angiogram on 10.28.2015 showed Flush occlusion of the left SFA  cardiology recommended surgical intervention - Amputation of the left fifth toe with Left common femoral artery to anterior tibial artery gortex bypass with composite PTFE left greater saphenous vein graft on 10.30.2015. ABI showed improvement. revision surgery with possible fourth ray amputation and possible transmetatarsal amputation. Possible wound VAC placement on 11.4.2015 - will de-escalate to cipro and doxy in am for 2 weeks after last revision. - pt eval rec Home health PT.  Diabetes Mellitus 2, insulin requiring  - Increase Lantuss back to 40 BID and cont Novolog-  - BG cont to improve. - A1c 8.9 BG poorly controlled. Will need aggressive treatment when ready to go home  Diarrhea  - due to antibiotics - c diff negative. - now resolved.  Essential hypertension  - Increase Imdur, cont Norvasc, Toprol   Coronary atherosclerosis  Cont ASA   Peripheral vascular disease  - cont ASA- vascular sx consulted. - see above for further details.  CKD (chronic kidney disease), stage III  - Cr increased stabilize. - will start slow IVF and follow B met   Code Status: Full code  Family Communication: none  Disposition Plan: to be determined  DVT prophylaxis:  Lovenox    Consultants:  Ortho  Vascular  cardio  Procedures:  Angiogram 10.287.2015  Antibiotics:  vanc zosyn  HPI/Subjective: No compalins  Objective: Filed Vitals:   01/31/14 2130 02/01/14 0434 02/01/14 0528 02/01/14 0919  BP: 154/66  132/58 154/64  Pulse: 77  73 68  Temp: 99.2 F (37.3 C)  99.1 F (37.3 C) 98.6 F (37 C)  TempSrc:   Oral Oral  Resp: '18  18 18  ' Height:      Weight:  107.956 kg (238 lb)    SpO2: 99%  97% 98%    Intake/Output Summary (Last 24 hours) at 02/01/14 1244 Last data filed at 02/01/14 0900  Gross per 24 hour  Intake    720 ml  Output    350 ml  Net    370 ml   Filed Weights   01/30/14 0645 01/30/14 2141 02/01/14 0434  Weight: 110.542 kg (243 lb 11.2 oz) 107.956 kg (238 lb) 107.956 kg (238 lb)    Exam:  General: Alert, awake, oriented x3, in no acute distress.  HEENT: No bruits, no goiter.  Heart: Regular rate and rhythm. Lungs: Good air movement, clear Abdomen: Soft, nontender, nondistended.    Data Reviewed: Basic Metabolic Panel:  Recent Labs Lab 01/26/14 0638 01/27/14 0602 01/28/14 0450 01/28/14 1228 01/28/14 1436 01/29/14 0354 01/31/14 0520  NA 137 137 140 139 140 141 146  K 3.9 3.9 4.1 4.5 4.5 4.5 3.8  CL 99 98 103  --   --  104 110  CO2 '23 22 23  ' --   --  23 23  GLUCOSE 150* 156* 180* 202* 220* 225* 173*  BUN '20 19 15  ' --   --  21 27*  CREATININE 1.77* 1.79* 1.69*  --   --  1.69* 2.06*  CALCIUM 8.8 9.1 8.6  --   --  7.9* 8.0*   Liver Function Tests: No results for input(s): AST, ALT, ALKPHOS, BILITOT, PROT, ALBUMIN in the last 168 hours. No results for input(s): LIPASE, AMYLASE in the last 168 hours. No results for input(s): AMMONIA in the last 168 hours. CBC:  Recent Labs Lab 01/28/14 0450 01/28/14 1228 01/28/14 1436 01/29/14 0354  WBC 19.4*  --   --  17.9*  HGB 9.8* 9.2* 9.2* 8.5*  HCT 29.7* 27.0* 27.0* 25.9*  MCV 82.0  --   --  83.0  PLT 399  --   --  406*   Cardiac Enzymes: No  results for input(s): CKTOTAL, CKMB, CKMBINDEX, TROPONINI in the last 168 hours. BNP (last 3 results) No results for input(s): PROBNP in the last 8760 hours. CBG:  Recent Labs Lab 01/31/14 1248 01/31/14 1736 01/31/14 2246 02/01/14 0732 02/01/14 1152  GLUCAP 223* 283* 233* 183* 217*    Recent Results (from the past 240 hour(s))  Culture, blood (routine x 2)     Status: None   Collection Time: 01/24/14  5:35 AM  Result Value Ref Range Status   Specimen Description BLOOD LEFT ARM  Final   Special Requests BOTTLES DRAWN AEROBIC ONLY 5CC  Final   Culture  Setup Time   Final    01/24/2014 09:47 Performed at [place]   Final    NO GROWTH 5 DAYS Performed at [facility]    Report Status 01/30/2014 FINAL  Final  Culture, blood (routine x 2)     Status: None   Collection Time: 01/24/14  5:45 AM  Result Value Ref Range Status   Specimen Description BLOOD LEFT ARM  Final   Special Requests BOTTLES DRAWN AEROBIC AND ANAEROBIC 5CC EACH  Final   Culture  Setup Time   Final    01/24/2014 09:47 Performed at [place]   Final    NO GROWTH 5 DAYS Performed at [facility]    Report Status 01/30/2014 FINAL  Final  Clostridium Difficile by PCR     Status: None   Collection Time: 01/24/14  8:07 PM  Result Value Ref Range Status   C difficile by pcr NEGATIVE NEGATIVE Final  Stool culture     Status: None   Collection Time: 01/24/14  8:07 PM  Result Value Ref Range Status   Specimen Description STOOL  Final   Special Requests NONE  Final   Culture   Final    NO SALMONELLA, SHIGELLA, CAMPYLOBACTER, YERSINIA, OR E.COLI 0157:H7 ISOLATED Performed at [facility]   Report Status 01/28/2014 FINAL  Final  Surgical PCR screen     Status: None   Collection Time: 01/27/14 10:56 PM  Result Value Ref Range Status   MRSA, PCR NEGATIVE NEGATIVE Final   Staphylococcus aureus NEGATIVE NEGATIVE Final    Comment:        The  Xpert SA Assay (FDA approved for NASAL specimens in patients over 34 years of age), is one component of a comprehensive surveillance program.  Test performance has been validated by [employer] for patients greater than or equal to 13 year old. It is not intended to diagnose infection nor to guide or monitor treatment.  Wound culture     Status: None   Collection Time: 01/28/14  3:29 PM  Result Value Ref Range Status   Specimen Description WOUND LEFT TOE  Final   Special Requests POF ZISTN AND VANCOMYCIN  Final   Gram Stain   Final    FEW WBC PRESENT,BOTH PMN AND MONONUCLEAR NO SQUAMOUS EPITHELIAL CELLS SEEN NO ORGANISMS SEEN Performed at [facility]    Culture   Final    FEW MICROAEROPHILIC STREPTOCOCCI Note: Standardized susceptibility testing for this organism is not available. Performed at [facility]    Report Status 01/30/2014 FINAL  Final  Anaerobic culture     Status: None (Preliminary result)   Collection Time: 01/28/14  3:29 PM  Result Value Ref Range Status   Specimen Description WOUND LEFT TOE  Final   Special Requests POF ZOSYN AND VANCOMYCIN  Final   Gram Stain   Final    FEW WBC PRESENT,BOTH PMN AND MONONUCLEAR NO SQUAMOUS EPITHELIAL CELLS SEEN NO ORGANISMS SEEN Performed at [facility]   Culture   Final    NO ANAEROBES ISOLATED; CULTURE IN PROGRESS FOR 5 DAYS Performed at [facility]   Report Status PENDING  Incomplete     Studies: No results found.  Scheduled Meds: . amLODipine  10 mg Oral Daily  . aspirin  81 mg Oral Daily  . chlorhexidine  60 mL Topical Once  . docusate sodium  100 mg Oral Daily  . enoxaparin (LOVENOX) injection  40 mg Subcutaneous Q24H  . feeding supplement (GLUCERNA SHAKE)  237 mL Oral BID BM  . feeding supplement (PRO-STAT SUGAR FREE 64)  30 mL Oral BID BM  . insulin aspart  0-20 Units Subcutaneous TID WC  . insulin aspart  0-5 Units Subcutaneous QHS  . insulin glargine  35 Units  Subcutaneous BID  . isosorbide mononitrate  30 mg Oral Daily  . metoprolol succinate  100 mg Oral BID  . pantoprazole  40 mg Oral Daily  . piperacillin-tazobactam (ZOSYN)  IV  3.375 g Intravenous Q8H  . rosuvastatin  40 mg Oral Daily  . vancomycin  1,000 mg Intravenous Q24H   Continuous Infusions:     Charlynne Cousins  [facility] Pager [phone]. If 8PM-8AM, please contact night-coverage at [email or web address], password [facility] 02/01/2014, 12:44 PM  LOS: 8 days

## 2014-02-02 ENCOUNTER — Encounter (HOSPITAL_COMMUNITY): Payer: Self-pay | Admitting: Critical Care Medicine

## 2014-02-02 ENCOUNTER — Inpatient Hospital Stay (HOSPITAL_COMMUNITY): Payer: Medicare PPO | Admitting: Anesthesiology

## 2014-02-02 ENCOUNTER — Encounter (HOSPITAL_COMMUNITY)
Admission: AD | Disposition: A | Discharge status: Skilled Nursing Facility | Payer: Self-pay | Source: Other Acute Inpatient Hospital | Attending: Internal Medicine

## 2014-02-02 HISTORY — PX: AMPUTATION: SHX166

## 2014-02-02 LAB — GLUCOSE, CAPILLARY
Glucose-Capillary: 121 mg/dL — ABNORMAL HIGH (ref 70–99)
Glucose-Capillary: 106 mg/dL — ABNORMAL HIGH (ref 70–99)
Glucose-Capillary: 97 mg/dL (ref 70–99)
Glucose-Capillary: 71 mg/dL (ref 70–99)
Glucose-Capillary: 75 mg/dL (ref 70–99)
Glucose-Capillary: 78 mg/dL (ref 70–99)
Glucose-Capillary: 83 mg/dL (ref 70–99)
Glucose-Capillary: 159 mg/dL — ABNORMAL HIGH (ref 70–99)

## 2014-02-02 LAB — ANAEROBIC CULTURE

## 2014-02-02 LAB — APTT: aPTT: 31 seconds (ref 24–37)

## 2014-02-02 SURGERY — AMPUTATION, FOOT, RAY
Anesthesia: General | Laterality: Left

## 2014-02-02 MED ORDER — FENTANYL CITRATE 0.05 MG/ML IJ SOLN
INTRAMUSCULAR | Status: AC
  Filled 2014-02-02: qty 5

## 2014-02-02 MED ORDER — FENTANYL CITRATE 0.05 MG/ML IJ SOLN: 25.0000 ug | INTRAMUSCULAR | Status: DC | PRN

## 2014-02-02 MED ORDER — LACTATED RINGERS IV SOLN
INTRAVENOUS | Status: DC
  Administered 2014-02-02: 12:00:00 via INTRAVENOUS

## 2014-02-02 MED ORDER — HYDROMORPHONE HCL 1 MG/ML IJ SOLN: 0.5000 mg | INTRAMUSCULAR | Status: DC | PRN

## 2014-02-02 MED ORDER — METHOCARBAMOL 500 MG PO TABS: 500.0000 mg | ORAL_TABLET | Freq: Four times a day (QID) | ORAL | Status: DC | PRN

## 2014-02-02 MED ORDER — ARTIFICIAL TEARS OP OINT
TOPICAL_OINTMENT | OPHTHALMIC | Status: AC
  Filled 2014-02-02: qty 3.5

## 2014-02-02 MED ORDER — 0.9 % SODIUM CHLORIDE (POUR BTL) OPTIME
TOPICAL | Status: DC | PRN
  Administered 2014-02-02: 1000 mL

## 2014-02-02 MED ORDER — PROPOFOL 10 MG/ML IV BOLUS
INTRAVENOUS | Status: DC | PRN
  Administered 2014-02-02: 150 mg via INTRAVENOUS

## 2014-02-02 MED ORDER — METOCLOPRAMIDE HCL 5 MG/ML IJ SOLN: 5.0000 mg | Freq: Three times a day (TID) | INTRAMUSCULAR | Status: DC | PRN

## 2014-02-02 MED ORDER — PROPOFOL 10 MG/ML IV BOLUS
INTRAVENOUS | Status: AC
  Filled 2014-02-02: qty 20

## 2014-02-02 MED ORDER — MIDAZOLAM HCL 2 MG/2ML IJ SOLN
INTRAMUSCULAR | Status: AC
  Filled 2014-02-02: qty 2

## 2014-02-02 MED ORDER — DEXTROSE 5 % IV SOLN: 500.0000 mg | Freq: Four times a day (QID) | INTRAVENOUS | Status: DC | PRN

## 2014-02-02 MED ORDER — FENTANYL CITRATE 0.05 MG/ML IJ SOLN
INTRAMUSCULAR | Status: DC | PRN
  Administered 2014-02-02: 100 ug via INTRAVENOUS

## 2014-02-02 MED ORDER — ONDANSETRON HCL 4 MG/2ML IJ SOLN: 4.0000 mg | Freq: Four times a day (QID) | INTRAMUSCULAR | Status: DC | PRN

## 2014-02-02 MED ORDER — ONDANSETRON HCL 4 MG/2ML IJ SOLN: 4.0000 mg | Freq: Once | INTRAMUSCULAR | Status: DC | PRN

## 2014-02-02 MED ORDER — LIDOCAINE HCL (CARDIAC) 20 MG/ML IV SOLN
INTRAVENOUS | Status: DC | PRN
  Administered 2014-02-02: 100 mg via INTRAVENOUS

## 2014-02-02 MED ORDER — ONDANSETRON HCL 4 MG PO TABS: 4.0000 mg | ORAL_TABLET | Freq: Four times a day (QID) | ORAL | Status: DC | PRN

## 2014-02-02 MED ORDER — OXYCODONE-ACETAMINOPHEN 5-325 MG PO TABS
1.0000 | ORAL_TABLET | ORAL | Status: DC | PRN
Start: 2014-02-02 — End: 2014-02-07

## 2014-02-02 MED ORDER — ONDANSETRON HCL 4 MG/2ML IJ SOLN
INTRAMUSCULAR | Status: AC
  Filled 2014-02-02: qty 2

## 2014-02-02 MED ORDER — SODIUM CHLORIDE 0.9 % IV SOLN
INTRAVENOUS | Status: DC
  Administered 2014-02-02: 17:00:00 via INTRAVENOUS

## 2014-02-02 MED ORDER — LIDOCAINE HCL (CARDIAC) 20 MG/ML IV SOLN
INTRAVENOUS | Status: AC
  Filled 2014-02-02: qty 5

## 2014-02-02 MED ORDER — METOCLOPRAMIDE HCL 5 MG PO TABS: 5.0000 mg | ORAL_TABLET | Freq: Three times a day (TID) | ORAL | Status: DC | PRN

## 2014-02-02 MED ORDER — DEXTROSE 50 % IV SOLN
INTRAVENOUS | Status: AC
  Filled 2014-02-02: qty 50

## 2014-02-02 MED ORDER — DOCUSATE SODIUM 100 MG PO CAPS
100.0000 mg | ORAL_CAPSULE | Freq: Two times a day (BID) | ORAL | Status: DC
  Administered 2014-02-02 – 2014-02-07 (×7): 100 mg via ORAL
  Filled 2014-02-02 (×10): qty 1

## 2014-02-02 SURGICAL SUPPLY — 29 items
BLADE SAW SGTL MED 73X18.5 STR (BLADE) ×2 IMPLANT
BNDG COHESIVE 4X5 TAN STRL (GAUZE/BANDAGES/DRESSINGS) ×2 IMPLANT
BNDG GAUZE ELAST 4 BULKY (GAUZE/BANDAGES/DRESSINGS) ×4 IMPLANT
COVER SURGICAL LIGHT HANDLE (MISCELLANEOUS) ×2 IMPLANT
DRAPE U-SHAPE 47X51 STRL (DRAPES) ×2 IMPLANT
DRSG ADAPTIC 3X8 NADH LF (GAUZE/BANDAGES/DRESSINGS) ×2 IMPLANT
DRSG PAD ABDOMINAL 8X10 ST (GAUZE/BANDAGES/DRESSINGS) ×4 IMPLANT
DURAPREP 26ML APPLICATOR (WOUND CARE) ×2 IMPLANT
ELECT REM PT RETURN 9FT ADLT (ELECTROSURGICAL) ×2
ELECTRODE REM PT RTRN 9FT ADLT (ELECTROSURGICAL) ×1 IMPLANT
GAUZE SPONGE 4X4 12PLY STRL (GAUZE/BANDAGES/DRESSINGS) ×2 IMPLANT
GLOVE BIOGEL PI IND STRL 9 (GLOVE) ×1 IMPLANT
GLOVE BIOGEL PI INDICATOR 9 (GLOVE) ×1
GLOVE SURG ORTHO 9.0 STRL STRW (GLOVE) ×2 IMPLANT
GOWN STRL REUS W/ TWL XL LVL3 (GOWN DISPOSABLE) ×2 IMPLANT
GOWN STRL REUS W/TWL 2XL LVL3 (GOWN DISPOSABLE) ×2 IMPLANT
GOWN STRL REUS W/TWL XL LVL3 (GOWN DISPOSABLE) ×4
KIT BASIN OR (CUSTOM PROCEDURE TRAY) ×2 IMPLANT
KIT ROOM TURNOVER OR (KITS) ×2 IMPLANT
NS IRRIG 1000ML POUR BTL (IV SOLUTION) ×2 IMPLANT
PACK ORTHO EXTREMITY (CUSTOM PROCEDURE TRAY) ×2 IMPLANT
PAD ARMBOARD 7.5X6 YLW CONV (MISCELLANEOUS) ×4 IMPLANT
SPONGE LAP 18X18 X RAY DECT (DISPOSABLE) ×2 IMPLANT
STOCKINETTE IMPERVIOUS LG (DRAPES) IMPLANT
SUT ETHILON 2 0 PSLX (SUTURE) ×4 IMPLANT
TOWEL OR 17X24 6PK STRL BLUE (TOWEL DISPOSABLE) ×2 IMPLANT
TOWEL OR 17X26 10 PK STRL BLUE (TOWEL DISPOSABLE) ×2 IMPLANT
UNDERPAD 30X30 INCONTINENT (UNDERPADS AND DIAPERS) ×2 IMPLANT
WATER STERILE IRR 1000ML POUR (IV SOLUTION) IMPLANT

## 2014-02-02 NOTE — Progress Notes (Signed)
[facility] PROGRESS NOTE Interim History: 73 y.o. male who presents with non healing wound to left foot.He was started on Keflex ~ 2 weeks ago.he developed worsening fever and chills and presented to outside hospital and was then transferred to [facility]. Started on Vanc/Zosyn. Consult ortho who rec vascular surgery consult, who recommended cardiology Dr. Fletcher Anon who rec angiogram on 10.28.2015, agree with proceeding with bypass and amputation.   Assessment/Plan: Gangrene left foot/ sepsis - PVD:  - Started on  Zosyn and Vanc 10.28.2015 consulted cardiology eval- ABI < 0.5 B/L - Angiogram on 10.28.2015 showed Flush occlusion of the left SFA  cardiology recommended surgical intervention - Amputation of the left fifth toe with Left common femoral artery to anterior tibial artery gortex bypass with composite PTFE left greater saphenous vein graft on 10.30.2015. ABI showed improvement. revision surgery with possible fourth ray amputation and possible transmetatarsal amputation.  - will de-escalate to cipro and doxy in am for 2 weeks after last revision. - pt eval rec Home health PT.  Diabetes Mellitus 2, insulin requiring  - Increase Lantuss back to 40 BID and cont Novolog-  - BG cont to improve. - A1c 8.9 BG poorly controlled. Will need aggressive treatment when ready to go home  Diarrhea  - due to antibiotics - c diff negative. - now resolved.  Essential hypertension  - Increase Imdur, cont Norvasc, Toprol   Coronary atherosclerosis  Cont ASA   Peripheral vascular disease  - cont ASA- vascular sx consulted. - see above for further details.  CKD (chronic kidney disease), stage III  - Cr increased stabilize. - will start slow IVF and follow B met   Code Status: Full code  Family Communication: none  Disposition Plan: to be determined  DVT prophylaxis: Lovenox    Consultants:  Ortho  Vascular  cardio  Procedures:  Angiogram  10.287.2015  Antibiotics:  vanc zosyn  HPI/Subjective: Patient seen denies any complaints, plan for surgery tonight.  Objective: Filed Vitals:   02/02/14 1615 02/02/14 1630 02/02/14 1645 02/02/14 1801  BP: 106/51 102/52 94/42 125/59  Pulse: 68 69 67 69  Temp:   97.4 F (36.3 C) 97.6 F (36.4 C)  TempSrc:    Oral  Resp: _0 Height:      Weight:      SpO2: 100% 100% 100% 100%    Intake/Output Summary (Last 24 hours) at 02/02/14 1812 Last data filed at 02/02/14 1534  Gross per 24 hour  Intake    640 ml  Output    950 ml  Net   -310 ml   Filed Weights   01/30/14 2141 02/01/14 0434 02/01/14 2033  Weight: 107.956 kg (238 lb) 107.956 kg (238 lb) 107.956 kg (238 lb)    Exam:  Physical Exam: Head: Normocephalic, atraumatic.  Eyes: No signs of jaundice, EOMI Lungs: Normal respiratory effort. B/L Clear to auscultation, no crackles or wheezes.  Heart: Regular RR. S1 and S2 normal  Abdomen: BS normoactive. Soft, Nondistended, non-tender.  Extremities: No pretibial edema, no erythema     Data Reviewed: Basic Metabolic Panel:  Recent Labs Lab 01/27/14 0602 01/28/14 0450 01/28/14 1228 01/28/14 1436 01/29/14 0354 01/31/14 0520  NA 137 140 139 140 141 146  K 3.9 4.1 4.5 4.5 4.5 3.8  CL 98 103  --   --  104 110  CO2 22 23  --   --  23 23  GLUCOSE 156* 180* 202* 220* 225* 173*  BUN 19  15  --   --  21 27*  CREATININE 1.79* 1.69*  --   --  1.69* 2.06*  CALCIUM 9.1 8.6  --   --  7.9* 8.0*   Liver Function Tests: No results for input(s): AST, ALT, ALKPHOS, BILITOT, PROT, ALBUMIN in the last 168 hours. No results for input(s): LIPASE, AMYLASE in the last 168 hours. No results for input(s): AMMONIA in the last 168 hours. CBC:  Recent Labs Lab 01/28/14 0450 01/28/14 1228 01/28/14 1436 01/29/14 0354  WBC 19.4*  --   --  17.9*  HGB 9.8* 9.2* 9.2* 8.5*  HCT 29.7* 27.0* 27.0* 25.9*  MCV 82.0  --   --  83.0  PLT 399  --   --  406*   Cardiac Enzymes: No  results for input(s): CKTOTAL, CKMB, CKMBINDEX, TROPONINI in the last 168 hours. BNP (last 3 results) No results for input(s): PROBNP in the last 8760 hours. CBG:  Recent Labs Lab 02/02/14 1127 02/02/14 1205 02/02/14 1359 02/02/14 1433 02/02/14 1547  GLUCAP 106* 97 71 75 78    Recent Results (from the past 240 hour(s))  Culture, blood (routine x 2)     Status: None   Collection Time: 01/24/14  5:35 AM  Result Value Ref Range Status   Specimen Description BLOOD LEFT ARM  Final   Special Requests BOTTLES DRAWN AEROBIC ONLY 5CC  Final   Culture  Setup Time   Final    01/24/2014 09:47 Performed at [place]   Final    NO GROWTH 5 DAYS Performed at [facility]    Report Status 01/30/2014 FINAL  Final  Culture, blood (routine x 2)     Status: None   Collection Time: 01/24/14  5:45 AM  Result Value Ref Range Status   Specimen Description BLOOD LEFT ARM  Final   Special Requests BOTTLES DRAWN AEROBIC AND ANAEROBIC 5CC EACH  Final   Culture  Setup Time   Final    01/24/2014 09:47 Performed at [place]   Final    NO GROWTH 5 DAYS Performed at [facility]    Report Status 01/30/2014 FINAL  Final  Clostridium Difficile by PCR     Status: None   Collection Time: 01/24/14  8:07 PM  Result Value Ref Range Status   C difficile by pcr NEGATIVE NEGATIVE Final  Stool culture     Status: None   Collection Time: 01/24/14  8:07 PM  Result Value Ref Range Status   Specimen Description STOOL  Final   Special Requests NONE  Final   Culture   Final    NO SALMONELLA, SHIGELLA, CAMPYLOBACTER, YERSINIA, OR E.COLI 0157:H7 ISOLATED Performed at [facility]   Report Status 01/28/2014 FINAL  Final  Surgical PCR screen     Status: None   Collection Time: 01/27/14 10:56 PM  Result Value Ref Range Status   MRSA, PCR NEGATIVE NEGATIVE Final   Staphylococcus aureus NEGATIVE NEGATIVE Final    Comment:        The Xpert SA  Assay (FDA approved for NASAL specimens in patients over 81 years of age), is one component of a comprehensive surveillance program.  Test performance has been validated by [employer] for patients greater than or equal to 22 year old. It is not intended to diagnose infection nor to guide or monitor treatment.  Wound culture     Status: None   Collection  Time: 01/28/14  3:29 PM  Result Value Ref Range Status   Specimen Description WOUND LEFT TOE  Final   Special Requests POF ZISTN AND VANCOMYCIN  Final   Gram Stain   Final    FEW WBC PRESENT,BOTH PMN AND MONONUCLEAR NO SQUAMOUS EPITHELIAL CELLS SEEN NO ORGANISMS SEEN Performed at [facility]    Culture   Final    FEW MICROAEROPHILIC STREPTOCOCCI Note: Standardized susceptibility testing for this organism is not available. Performed at [facility]    Report Status 01/30/2014 FINAL  Final  Anaerobic culture     Status: None   Collection Time: 01/28/14  3:29 PM  Result Value Ref Range Status   Specimen Description WOUND LEFT TOE  Final   Special Requests POF ZOSYN AND VANCOMYCIN  Final   Gram Stain   Final    FEW WBC PRESENT,BOTH PMN AND MONONUCLEAR NO SQUAMOUS EPITHELIAL CELLS SEEN NO ORGANISMS SEEN Performed at [facility]    Culture   Final    NO ANAEROBES ISOLATED Performed at [facility]    Report Status 02/02/2014 FINAL  Final  Surgical pcr screen     Status: None   Collection Time: 02/01/14  7:49 PM  Result Value Ref Range Status   MRSA, PCR NEGATIVE NEGATIVE Final   Staphylococcus aureus NEGATIVE NEGATIVE Final    Comment:        The Xpert SA Assay (FDA approved for NASAL specimens in patients over 75 years of age), is one component of a comprehensive surveillance program.  Test performance has been validated by [employer] for patients greater than or equal to 75 year old. It is not intended to diagnose infection nor to guide or monitor treatment.       Studies: No results found.  Scheduled Meds: . amLODipine  10 mg Oral Daily  . aspirin  81 mg Oral Daily  . dextrose      . docusate sodium  100 mg Oral BID  . enoxaparin (LOVENOX) injection  40 mg Subcutaneous Q24H  . feeding supplement (GLUCERNA SHAKE)  237 mL Oral BID BM  . feeding supplement (PRO-STAT SUGAR FREE 64)  30 mL Oral BID BM  . insulin aspart  0-20 Units Subcutaneous TID WC  . insulin aspart  0-5 Units Subcutaneous QHS  . insulin glargine  40 Units Subcutaneous BID  . isosorbide mononitrate  60 mg Oral Daily  . metoprolol succinate  100 mg Oral BID  . pantoprazole  40 mg Oral Daily  . piperacillin-tazobactam (ZOSYN)  IV  3.375 g Intravenous Q8H  . rosuvastatin  40 mg Oral Daily  . vancomycin  1,000 mg Intravenous Q24H   Continuous Infusions: . sodium chloride 10 mL/hr at 02/02/14 1724  . lactated ringers 10 mL/hr at 02/02/14 [facility] Pager [phone]. If 8PM-8AM, please contact night-coverage at [email or web address], password [email or web address] 02/02/2014, 6:12 PM  LOS: 9 days

## 2014-02-02 NOTE — Transfer of Care (Signed)
Immediate Anesthesia Transfer of Care Note  Patient: Jordan [name]  Procedure(s) Performed: Procedure(s): Left 4th Ray Amputation vs Transmetatarsal Amputation (Left)  Patient Location: PACU  Anesthesia Type:General  Level of Consciousness: sedated  Airway & Oxygen Therapy: Patient Spontanous Breathing and Patient connected to nasal cannula oxygen  Post-op Assessment: Report given to PACU RN, Post -op Vital signs reviewed and stable and Patient moving all extremities  Post vital signs: Reviewed and stable  Complications: No apparent anesthesia complications

## 2014-02-02 NOTE — Anesthesia Procedure Notes (Signed)
Procedure Name: LMA Insertion Date/Time: 02/02/2014 2:59 PM Performed by: Charm BargesBUTLER, Theadore Blunck R Pre-anesthesia Checklist: Patient identified, Emergency Drugs available, Suction available, Patient being monitored and Timeout performed Patient Re-evaluated:Patient Re-evaluated prior to inductionOxygen Delivery Method: Circle system utilized Preoxygenation: Pre-oxygenation with 100% oxygen Intubation Type: IV induction LMA: LMA inserted LMA Size: 5.0 Number of attempts: 1 Placement Confirmation: positive ETCO2 and breath sounds checked- equal and bilateral Tube secured with: Tape Dental Injury: Teeth and Oropharynx as per pre-operative assessment

## 2014-02-02 NOTE — H&P (View-Only) (Signed)
Patient ID: Jordan [name], male   DOB: 03/07/1941, 73 y.o.   MRN: [id number] Patient is status post revascularization to the left lower extremity. Ankle-brachial indices shows excellent improvement in his macro circulation. Patient is status post fifth ray amputation with necrotic wound bed. I will plan on revision surgery with possible fourth ray amputation and possible transmetatarsal amputation. Possible wound VAC placement. We will plan on surgery for Wednesday. 

## 2014-02-02 NOTE — Interval H&P Note (Signed)
History and Physical Interval Note:  02/02/2014 6:26 AM  Jordan [name]  has presented today for surgery, with the diagnosis of Gangrene 5th Ray Amputation  The various methods of treatment have been discussed with the patient and family. After consideration of risks, benefits and other options for treatment, the patient has consented to  Procedure(s): Left 4th Ray Amputation vs Transmetatarsal Amputation (Left) as a surgical intervention .  The patient's history has been reviewed, patient examined, no change in status, stable for surgery.  I have reviewed the patient's chart and labs.  Questions were answered to the patient's satisfaction.     DUDA,MARCUS V

## 2014-02-02 NOTE — Anesthesia Preprocedure Evaluation (Addendum)
Anesthesia Evaluation  Patient identified by MRN, date of birth, ID band Patient awake    Reviewed: Allergy & Precautions, H&P , NPO status , Patient's Chart, lab work & pertinent test results, reviewed documented beta blocker date and time   Airway Mallampati: I  TM Distance: >3 FB Neck ROM: full    Dental  (+) Teeth Intact, Dental Advisory Given   Pulmonary shortness of breath and with exertion, former smoker,  breath sounds clear to auscultation        Cardiovascular hypertension, Pt. on medications and Pt. on home beta blockers + CAD, + Past MI, + Cardiac Stents and + Peripheral Vascular Disease Rhythm:Regular Rate:Normal     Neuro/Psych    GI/Hepatic negative GI ROS, Neg liver ROS,   Endo/Other  diabetes, Well Controlled, Type 2, Insulin DependentMorbid obesityobese  Renal/GU Renal InsufficiencyRenal disease     Musculoskeletal   Abdominal (+)  Abdomen: soft. Bowel sounds: normal.  Peds  Hematology negative hematology ROS (+)   Anesthesia Other Findings   Reproductive/Obstetrics                          Anesthesia Physical Anesthesia Plan  ASA: III  Anesthesia Plan: General   Post-op Pain Management:    Induction: Intravenous  Airway Management Planned: LMA  Additional Equipment:   Intra-op Plan:   Post-operative Plan: Extubation in OR  Informed Consent: I have reviewed the patients History and Physical, chart, labs and discussed the procedure including the risks, benefits and alternatives for the proposed anesthesia with the patient or authorized representative who has indicated his/her understanding and acceptance.   Dental advisory given  Plan Discussed with: CRNA, Anesthesiologist and Surgeon  Anesthesia Plan Comments:        Anesthesia Quick Evaluation

## 2014-02-02 NOTE — Plan of Care (Signed)
Problem: Phase II Progression Outcomes Goal: Tolerating diet Outcome: Completed/Met Date Met:  02/02/14

## 2014-02-02 NOTE — Progress Notes (Signed)
Pts blood sugar 97 at 1205 when he arrived to us.  Rechecked at 1400 and was 71.  Pt asymptomatic, but reported to Dr Randa EvensEdwards who instructed to repeat sugar at 1430 and if had dropped any lower to medicate with 1/2 amp D50.  Repeat sugar was 75.  Dr Ivin Bootyrews aware and instructed to repeat q hour.

## 2014-02-02 NOTE — Progress Notes (Signed)
   VASCULAR SURGERY ASSESSMENT & PLAN:  * 5 Days Post-Op s/p: left femoral to anterior tibial artery bypass with composite PTFE vein graft. Graft is patent. ABI is 96% on the left.   *For debridement of left foot today by Dr. Lajoyce Cornersuda  .   * Intraoperative culture shows FEW MICROAEROPHILIC STREPTOCOCCI. The patient continues vancomycin and Zosyn as per primary service.  * Appreciate Nutrition teams help. Appetite is improving. He is getting Glucerna shakes and taking Prostat.   * On Lovenox for DVT prophylaxis.     SUBJECTIVE: No complaints.  PHYSICAL EXAM: Filed Vitals:   02/01/14 0919 02/01/14 1721 02/01/14 2033 02/02/14 0432  BP: 154/64 145/63 146/62 136/57  Pulse: 68 66 70 71  Temp: 98.6 F (37 C) 98 F (36.7 C) 99 F (37.2 C) 99.4 F (37.4 C)  TempSrc: Oral Oral Oral Oral  Resp: 18 18 18 18   Height:      Weight:   238 lb (107.956 kg)   SpO2: 98% 99% 98% 99%   Palpable graft pulse Incisions look fine.  LABS:  Lab Results  Component Value Date   CREATININE 2.06* 01/31/2014   CBG (last 3)   Recent Labs  02/01/14 1152 02/01/14 1618 02/01/14 2037  GLUCAP 217* 270* 225*    Principal Problem:   Diabetic foot ulcer Active Problems:   Diabetes   Hyperlipidemia   Essential hypertension   Coronary atherosclerosis   Cerebrovascular disease   Peripheral vascular disease   PAD (peripheral artery disease)   Diabetic osteomyelitis   Acute renal failure   CKD (chronic kidney disease), stage III   Leukocytosis   Fever presenting with conditions classified elsewhere   Diarrhea   Gangrene   Diabetic ulcer of left foot associated with diabetes mellitus due to underlying condition   Type 2 diabetes mellitus with diabetic chronic kidney disease   Cari CarawayChris Dickson Beeper: [phone] 02/02/2014

## 2014-02-02 NOTE — Anesthesia Postprocedure Evaluation (Signed)
  Anesthesia Post-op Note  Patient: Jordan [name]  Procedure(s) Performed: Procedure(s): Left 4th Ray Amputation vs Transmetatarsal Amputation (Left)  Patient Location: PACU  Anesthesia Type: General   Level of Consciousness: awake, alert  and oriented  Airway and Oxygen Therapy: Patient Spontanous Breathing  Post-op Pain: mild  Post-op Assessment: Post-op Vital signs reviewed  Post-op Vital Signs: Reviewed  Last Vitals:  Filed Vitals:   02/02/14 1600  BP: 115/51  Pulse: 71  Temp:   Resp: 17    Complications: No apparent anesthesia complications

## 2014-02-02 NOTE — Progress Notes (Signed)
Physical Therapy Treatment Patient Details Name: Jordan [name] MRN: [id number] DOB: 12-22-1940 Today's Date: 02/02/2014    History of Present Illness Jordan [name] is a 73 y.o. 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 PMH of DM, HTN, coronary atherosclerosis, CVD, PVD, PAD, diabetic osteomyelitis, CKD stage III, leukocytosis, and gangrene.     PT Comments    Pt progressing well towards physical therapy goals. Focus of session was therapeutic exercise and transfer to recliner. Gait training deferred as pt will be NPO all day and is already feeling weak and hungry. Want to conserve energy as much as possible as procedure is scheduled for 6pm. Will continue to follow.   Follow Up Recommendations  Home health PT;Supervision/Assistance - 24 hour     Equipment Recommendations  Rolling walker with 5" wheels    Recommendations for Other Services       Precautions / Restrictions Precautions Precautions: Fall Restrictions Weight Bearing Restrictions: No    Mobility  Bed Mobility Overal bed mobility: Needs Assistance Bed Mobility: Supine to Sit     Supine to sit: Supervision;HOB elevated     General bed mobility comments: No physical assist required. Pt moved efficiently to EOB.  Transfers Overall transfer level: Needs assistance Equipment used: Rolling walker (2 wheeled) Transfers: Sit to/from UGI CorporationStand;Stand Pivot Transfers Sit to Stand: Min guard Stand pivot transfers: Min guard       General transfer comment: VC's for hand placement on seated surface for safety. Directional cues as well to take pivotal steps around to the recliner while keeping lines out of the way. No assist for balance required.   Ambulation/Gait             General Gait Details: Gait training deferred as pt will be NPO all day and is already feeling  weak and hungry. Want to conserve energy as much as possible as procedure is scheduled for 6pm.    Stairs            Wheelchair Mobility    Modified Rankin (Stroke Patients Only)       Balance Overall balance assessment: Needs assistance Sitting-balance support: Feet supported;No upper extremity supported Sitting balance-Leahy Scale: Good     Standing balance support: Bilateral upper extremity supported;During functional activity Standing balance-Leahy Scale: Fair Standing balance comment: Feel that pt can stand without UE support, however for dynamic activity would recommend at least single extremity support.                     Cognition Arousal/Alertness: Awake/alert Behavior During Therapy: WFL for tasks assessed/performed Overall Cognitive Status: Within Functional Limits for tasks assessed                      Exercises General Exercises - Lower Extremity Ankle Circles/Pumps: 15 reps Quad Sets: 15 reps Short Arc Quad: 15 reps Hip ABduction/ADduction: 15 reps    General Comments        Pertinent Vitals/Pain Pain Assessment: No/denies pain    Home Living                      Prior Function            PT Goals (current goals can now be found in the care plan section) Acute Rehab PT Goals Patient Stated Goal: to move better  PT Goal Formulation: With patient Time For Goal Achievement: 02/05/14 Potential to Achieve Goals: Good Progress towards PT goals: Progressing toward goals    Frequency  Min 3X/week    PT Plan Current plan remains appropriate    Co-evaluation             End of Session Equipment Utilized During Treatment: Gait belt Activity Tolerance: Patient tolerated treatment well Patient left: in chair;with call bell/phone within reach;with family/visitor present     Time: 1000-1025 PT Time Calculation (min): 25 min  Charges:  $Therapeutic Exercise: 8-22 mins $Therapeutic Activity: 8-22 mins                     G Codes:      Conni SlipperKirkman, Karly Pitter 02/02/2014, 10:36 AM   Conni SlipperLaura Bryceson Grape, PT, DPT Acute Rehabilitation Services Pager: [phone]

## 2014-02-02 NOTE — Op Note (Signed)
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  PROCEDURE:  Chopart amputation left foot  SURGEON:  Nadara MustardUDA,Yoon Barca V, MD  PHYSICIAN ASSISTANT:None ANESTHESIA:   General  PREOPERATIVE INDICATIONS:  Jordan [name] is a  73 y.o. male with a diagnosis of Gangrene 5th Ray Amputation who failed conservative measures and elected for surgical management.    The risks benefits and alternatives were discussed with the patient preoperatively including but not limited to the risks of infection, bleeding, nerve injury, cardiopulmonary complications, the need for revision surgery, among others, and the patient was willing to proceed.  OPERATIVE IMPLANTS: none  OPERATIVE FINDINGS: deep necrotic abscess to the midfoot.  OPERATIVE PROCEDURE: patient was brought to the operating room and underwent a general anesthetic. After adequate levels of anesthesia were obtained patient's left lower extremity was prepped using DuraPrep draped into a sterile field. Patient is previously status post a fifth ray amputation. Incision was made through the midfoot and a large abscess was encountered. This required further soft tissue and bone resection than initially anticipated. Patient initially underwent a transmetatarsal amputation this was a carried more proximally to get to more healthy viable tissue. Patient essentially required a she'll part amputation patient underwent irrigation and debridement. There was a small amount petechial bleeding. The skin was closed using 2-0 nylon. A sterile compressive dressing was applied. Patient was extubated taken to the PACU in stable condition.

## 2014-02-03 ENCOUNTER — Encounter (HOSPITAL_COMMUNITY): Payer: Self-pay | Admitting: Orthopedic Surgery

## 2014-02-03 LAB — GLUCOSE, CAPILLARY
Glucose-Capillary: 77 mg/dL (ref 70–99)
Glucose-Capillary: 128 mg/dL — ABNORMAL HIGH (ref 70–99)
Glucose-Capillary: 124 mg/dL — ABNORMAL HIGH (ref 70–99)
Glucose-Capillary: 95 mg/dL (ref 70–99)

## 2014-02-03 LAB — APTT: aPTT: 35 seconds (ref 24–37)

## 2014-02-03 NOTE — Progress Notes (Signed)
ANTIBIOTIC CONSULT NOTE -follow up Pharmacy Consult for vanc/zosyn Indication: gangrene L foot/sepsis  Allergies  Allergen Reactions  . Cilostazol Other (See Comments)    Feels like feet on fire    Patient Measurements: Height: 5\' 8"  (172.7 cm) Weight: 239 lb 3.2 oz (108.5 kg) IBW/kg (Calculated) : 68.4   Vital Signs: Temp: 98 F (36.7 C) (11/05 1000) Temp Source: Oral (11/05 1000) BP: 146/56 mmHg (11/05 1000) Pulse Rate: 63 (11/05 1000) Intake/Output from previous day: 11/04 0701 - 11/05 0700 In: 520 [P.O.:120; I.V.:400] Out: 1225 [Urine:1225] Intake/Output from this shift: Total I/O In: 600 [P.O.:600] Out: 550 [Urine:550]  Labs: No results for input(s): WBC, HGB, PLT, LABCREA, CREATININE in the last 72 hours. Estimated Creatinine Clearance: 38.1 mL/min (by C-G formula based on Cr of 2.06).  Recent Labs  01/31/14 2200  VANCOTROUGH 19.8     Microbiology: Recent Results (from the past 720 hour(s))  Culture, blood (routine x 2)     Status: None   Collection Time: 01/24/14  5:35 AM  Result Value Ref Range Status   Specimen Description BLOOD LEFT ARM  Final   Special Requests BOTTLES DRAWN AEROBIC ONLY 5CC  Final   Culture  Setup Time   Final    01/24/2014 09:47 Performed at [facility]    Culture   Final    NO GROWTH 5 DAYS Performed at [facility]    Report Status 01/30/2014 FINAL  Final  Culture, blood (routine x 2)     Status: None   Collection Time: 01/24/14  5:45 AM  Result Value Ref Range Status   Specimen Description BLOOD LEFT ARM  Final   Special Requests BOTTLES DRAWN AEROBIC AND ANAEROBIC 5CC EACH  Final   Culture  Setup Time   Final    01/24/2014 09:47 Performed at [facility]    Culture   Final    NO GROWTH 5 DAYS Performed at [facility]    Report Status 01/30/2014 FINAL  Final  Clostridium Difficile by PCR     Status: None   Collection Time: 01/24/14  8:07 PM  Result Value Ref Range Status   C  difficile by pcr NEGATIVE NEGATIVE Final  Stool culture     Status: None   Collection Time: 01/24/14  8:07 PM  Result Value Ref Range Status   Specimen Description STOOL  Final   Special Requests NONE  Final   Culture   Final    NO SALMONELLA, SHIGELLA, CAMPYLOBACTER, YERSINIA, OR E.COLI 0157:H7 ISOLATED Performed at [facility]   Report Status 01/28/2014 FINAL  Final  Surgical PCR screen     Status: None   Collection Time: 01/27/14 10:56 PM  Result Value Ref Range Status   MRSA, PCR NEGATIVE NEGATIVE Final   Staphylococcus aureus NEGATIVE NEGATIVE Final    Comment:        The Xpert SA Assay (FDA approved for NASAL specimens in patients over 73 years of age), is one component of a comprehensive surveillance program.  Test performance has been validated by [facility] for patients greater than or equal to 73 year old. It is not intended to diagnose infection nor to guide or monitor treatment.  Wound culture     Status: None   Collection Time: 01/28/14  3:29 PM  Result Value Ref Range Status   Specimen Description WOUND LEFT TOE  Final   Special Requests POF ZISTN AND VANCOMYCIN  Final   Gram Stain  Final    FEW WBC PRESENT,BOTH PMN AND MONONUCLEAR NO SQUAMOUS EPITHELIAL CELLS SEEN NO ORGANISMS SEEN Performed at [facility]    Culture   Final    FEW MICROAEROPHILIC STREPTOCOCCI Note: Standardized susceptibility testing for this organism is not available. Performed at [facility]    Report Status 01/30/2014 FINAL  Final  Anaerobic culture     Status: None   Collection Time: 01/28/14  3:29 PM  Result Value Ref Range Status   Specimen Description WOUND LEFT TOE  Final   Special Requests POF ZOSYN AND VANCOMYCIN  Final   Gram Stain   Final    FEW WBC PRESENT,BOTH PMN AND MONONUCLEAR NO SQUAMOUS EPITHELIAL CELLS SEEN NO ORGANISMS SEEN Performed at [facility]    Culture   Final    NO ANAEROBES ISOLATED Performed at [facility]    Report Status 02/02/2014 FINAL  Final  Surgical pcr screen     Status: None   Collection Time: 02/01/14  7:49 PM  Result Value Ref Range Status   MRSA, PCR NEGATIVE NEGATIVE Final   Staphylococcus aureus NEGATIVE NEGATIVE Final    Comment:        The Xpert SA Assay (FDA approved for NASAL specimens in patients over 73 years of age), is one component of a comprehensive surveillance program.  Test performance has been validated by [facility] for patients greater than or equal to 493 year old. It is not intended to diagnose infection nor to guide or monitor treatment.    } Assessment: 73 yo M on vanc/zosyn day # 11 for L foot gangrene/sepsis.  POD # 6 L 5th toe amputation; POD#1 chopart amputation of L foot.  Dr. Lajoyce Cornersuda recommends at least 3 more days of IV ABx and then home on po ABx. Afebrile, last creat 2.06 on 11/2.   Goal of Therapy:  Vancomycin trough level 10-15 mcg/ml  Plan:  -continue vancomycin 1 gm q24 -check vanc trough tonight for possible outpt abx -continue zosyn 3.375 gm IV q8h, infuse each dose over 4 hours Herby AbrahamMichelle T. Lyrika Souders, Pharm.D. [phone] 02/03/2014 2:27 PM

## 2014-02-03 NOTE — Progress Notes (Signed)
Patient ID: Jordan [name], male   DOB: 1940-09-19, 73 y.o.   MRN: [id number] Patient is postoperative day 1 status post hindfoot amputation on the left. The dressing is clean and dry. Patient had a large abscess and necrotic tissue that extended through the midfoot region. Discussed with the patient he has about a 50-50 chance of the wound healing. Continue protected weightbearing. Recommend continue IV antibiotics for at least 3 days and discharged on oral antibiotics.

## 2014-02-03 NOTE — Progress Notes (Signed)
[facility] PROGRESS NOTE Interim History: 73 y.o. male who presents with non healing wound to left foot.He was started on Keflex ~ 2 weeks ago.he developed worsening fever and chills and presented to outside hospital and was then transferred to [facility]. Started on Vanc/Zosyn. Consult ortho who rec vascular surgery consult, who recommended cardiology Dr. Fletcher Anon who rec angiogram on 10.28.2015, agree with proceeding with bypass and amputation.   Assessment/Plan:  Gangrene left foot/ sepsis - PVD:  S/p hindfoot amputation of left foot - Started on  Zosyn and Vanc 10.28.2015 consulted cardiology eval- ABI < 0.5 B/L - Angiogram on 10.28.2015 showed Flush occlusion of the left SFA  cardiology recommended surgical intervention - Amputation of the left fifth toe with Left common femoral artery to anterior tibial artery gortex bypass with composite PTFE left greater saphenous vein graft on 10.30.2015. ABI showed improvement. revision surgery with possible fourth ray amputation and possible transmetatarsal amputation.  - will de-escalate to cipro and doxy in am for 2 weeks after last revision. - pt eval rec Home health PT.  Diabetes Mellitus 2, insulin requiring  - Increase Lantuss back to 40 BID and cont Novolog-  - BG cont to improve. - A1c 8.9 BG poorly controlled. Will need aggressive treatment when ready to go home  Diarrhea  - due to antibiotics - c diff negative. - now resolved.  Essential hypertension  - Increase Imdur, cont Norvasc, Toprol   Coronary atherosclerosis  Cont ASA   Peripheral vascular disease  - cont ASA- vascular sx consulted. - see above for further details.  CKD (chronic kidney disease), stage III  - Cr increased stabilize. - will start slow IVF and follow B met   Code Status: Full code  Family Communication: none  Disposition Plan: to be determined  DVT prophylaxis:  Lovenox    Consultants:  Ortho  Vascular  cardio  Procedures:  Angiogram 10.287.2015  Antibiotics:  vanc zosyn  HPI/Subjective: Patient seen denies any complaints, s/p surgery.  Objective: Filed Vitals:   02/02/14 1801 02/02/14 2055 02/03/14 0439 02/03/14 1000  BP: 125/59 133/56 102/77 146/56  Pulse: 69 68 62 63  Temp: 97.6 F (36.4 C) 97.9 F (36.6 C) 98.7 F (37.1 C) 98 F (36.7 C)  TempSrc: Oral Oral Oral Oral  Resp: _0 Height:      Weight:  108.5 kg (239 lb 3.2 oz)    SpO2: 100% 97% 98% 98%    Intake/Output Summary (Last 24 hours) at 02/03/14 1329 Last data filed at 02/03/14 0900  Gross per 24 hour  Intake    760 ml  Output   1775 ml  Net  -1015 ml   Filed Weights   02/01/14 0434 02/01/14 2033 02/02/14 2055  Weight: 107.956 kg (238 lb) 107.956 kg (238 lb) 108.5 kg (239 lb 3.2 oz)    Exam:  Physical Exam: Head: Normocephalic, atraumatic.  Eyes: No signs of jaundice, EOMI Lungs: Normal respiratory effort. B/L Clear to auscultation, no crackles or wheezes.  Heart: Regular RR. S1 and S2 normal  Abdomen: BS normoactive. Soft, Nondistended, non-tender.  Extremities: Left foot in dressing     Data Reviewed: Basic Metabolic Panel:  Recent Labs Lab 01/28/14 0450 01/28/14 1228 01/28/14 1436 01/29/14 0354 01/31/14 0520  NA 140 139 140 141 146  K 4.1 4.5 4.5 4.5 3.8  CL 103  --   --  104 110  CO2 23  --   --  23 23  GLUCOSE  180* 202* 220* 225* 173*  BUN 15  --   --  21 27*  CREATININE 1.69*  --   --  1.69* 2.06*  CALCIUM 8.6  --   --  7.9* 8.0*   Liver Function Tests: No results for input(s): AST, ALT, ALKPHOS, BILITOT, PROT, ALBUMIN in the last 168 hours. No results for input(s): LIPASE, AMYLASE in the last 168 hours. No results for input(s): AMMONIA in the last 168 hours. CBC:  Recent Labs Lab 01/28/14 0450 01/28/14 1228 01/28/14 1436 01/29/14 0354  WBC 19.4*  --   --  17.9*  HGB 9.8* 9.2* 9.2* 8.5*  HCT 29.7* 27.0*  27.0* 25.9*  MCV 82.0  --   --  83.0  PLT 399  --   --  406*   Cardiac Enzymes: No results for input(s): CKTOTAL, CKMB, CKMBINDEX, TROPONINI in the last 168 hours. BNP (last 3 results) No results for input(s): PROBNP in the last 8760 hours. CBG:  Recent Labs Lab 02/02/14 1547 02/02/14 1706 02/02/14 2051 02/03/14 0750 02/03/14 1150  GLUCAP 78 83 159* 77 128*    Recent Results (from the past 240 hour(s))  Clostridium Difficile by PCR     Status: None   Collection Time: 01/24/14  8:07 PM  Result Value Ref Range Status   C difficile by pcr NEGATIVE NEGATIVE Final  Stool culture     Status: None   Collection Time: 01/24/14  8:07 PM  Result Value Ref Range Status   Specimen Description STOOL  Final   Special Requests NONE  Final   Culture   Final    NO SALMONELLA, SHIGELLA, CAMPYLOBACTER, YERSINIA, OR E.COLI 0157:H7 ISOLATED Performed at [facility]   Report Status 01/28/2014 FINAL  Final  Surgical PCR screen     Status: None   Collection Time: 01/27/14 10:56 PM  Result Value Ref Range Status   MRSA, PCR NEGATIVE NEGATIVE Final   Staphylococcus aureus NEGATIVE NEGATIVE Final    Comment:        The Xpert SA Assay (FDA approved for NASAL specimens in patients over 25 years of age), is one component of a comprehensive surveillance program.  Test performance has been validated by [employer] for patients greater than or equal to 64 year old. It is not intended to diagnose infection nor to guide or monitor treatment.  Wound culture     Status: None   Collection Time: 01/28/14  3:29 PM  Result Value Ref Range Status   Specimen Description WOUND LEFT TOE  Final   Special Requests POF ZISTN AND VANCOMYCIN  Final   Gram Stain   Final    FEW WBC PRESENT,BOTH PMN AND MONONUCLEAR NO SQUAMOUS EPITHELIAL CELLS SEEN NO ORGANISMS SEEN Performed at [facility]    Culture   Final    FEW MICROAEROPHILIC STREPTOCOCCI Note: Standardized susceptibility testing  for this organism is not available. Performed at [facility]    Report Status 01/30/2014 FINAL  Final  Anaerobic culture     Status: None   Collection Time: 01/28/14  3:29 PM  Result Value Ref Range Status   Specimen Description WOUND LEFT TOE  Final   Special Requests POF ZOSYN AND VANCOMYCIN  Final   Gram Stain   Final    FEW WBC PRESENT,BOTH PMN AND MONONUCLEAR NO SQUAMOUS EPITHELIAL CELLS SEEN NO ORGANISMS SEEN Performed at [facility]    Culture   Final    NO ANAEROBES ISOLATED Performed at [facility]    Report Status 02/02/2014 FINAL  Final  Surgical pcr screen     Status: None   Collection Time: 02/01/14  7:49 PM  Result Value Ref Range Status   MRSA, PCR NEGATIVE NEGATIVE Final   Staphylococcus aureus NEGATIVE NEGATIVE Final    Comment:        The Xpert SA Assay (FDA approved for NASAL specimens in patients over 53 years of age), is one component of a comprehensive surveillance program.  Test performance has been validated by [employer] for patients greater than or equal to 35 year old. It is not intended to diagnose infection nor to guide or monitor treatment.      Studies: No results found.  Scheduled Meds: . amLODipine  10 mg Oral Daily  . aspirin  81 mg Oral Daily  . docusate sodium  100 mg Oral BID  . enoxaparin (LOVENOX) injection  40 mg Subcutaneous Q24H  . feeding supplement (GLUCERNA SHAKE)  237 mL Oral BID BM  . feeding supplement (PRO-STAT SUGAR FREE 64)  30 mL Oral BID BM  . insulin aspart  0-20 Units Subcutaneous TID WC  . insulin aspart  0-5 Units Subcutaneous QHS  . insulin glargine  40 Units Subcutaneous BID  . isosorbide mononitrate  60 mg Oral Daily  . metoprolol succinate  100 mg Oral BID  . pantoprazole  40 mg Oral Daily  . piperacillin-tazobactam (ZOSYN)  IV  3.375 g Intravenous Q8H  . rosuvastatin  40 mg Oral Daily  . vancomycin  1,000 mg Intravenous Q24H   Continuous Infusions: . sodium chloride  10 mL/hr at 02/02/14 1724  . lactated ringers 10 mL/hr at 02/02/14 [place] [facility] Pager [phone]. If 8PM-8AM, please contact night-coverage at [email or web address], password [facility] 02/03/2014, 1:29 PM  LOS: 10 days

## 2014-02-03 NOTE — Progress Notes (Addendum)
  Progress Note    02/03/2014 10:50 AM 1 Day Post-Op  Subjective:  No complaints  Afebrile x 24 hrs VSS  Filed Vitals:   02/03/14 1000  BP: 146/56  Pulse: 63  Temp: 98 F (36.7 C)  Resp: 18    Physical Exam: Lungs:  Non labored Incisions:  Healing nicely Extremities:  Left leg is warm with brisk doppler signal within the graft.   CBC    Component Value Date/Time   WBC 17.9* 01/29/2014 0354   RBC 3.12* 01/29/2014 0354   HGB 8.5* 01/29/2014 0354   HCT 25.9* 01/29/2014 0354   PLT 406* 01/29/2014 0354   MCV 83.0 01/29/2014 0354   MCH 27.2 01/29/2014 0354   MCHC 32.8 01/29/2014 0354   RDW 14.9 01/29/2014 0354   LYMPHSABS 1.7 04/02/2013 0950   MONOABS 0.6 04/02/2013 0950   EOSABS 0.3 04/02/2013 0950   BASOSABS 0.0 04/02/2013 0950    BMET    Component Value Date/Time   NA 146 01/31/2014 0520   K 3.8 01/31/2014 0520   CL 110 01/31/2014 0520   CO2 23 01/31/2014 0520   GLUCOSE 173* 01/31/2014 0520   BUN 27* 01/31/2014 0520   CREATININE 2.06* 01/31/2014 0520   CREATININE 1.27 12/19/2010 1702   CREATININE 1.27 12/19/2010 1702   CALCIUM 8.0* 01/31/2014 0520   GFRNONAA 30* 01/31/2014 0520   GFRAA 35* 01/31/2014 0520    INR    Component Value Date/Time   INR 1.33 01/24/2014 0535     Intake/Output Summary (Last 24 hours) at 02/03/14 1050 Last data filed at 02/03/14 0900  Gross per 24 hour  Intake    760 ml  Output   1775 ml  Net  -1015 ml     Assessment:  73 y.o. male is s/p:  1. Left common femoral artery to anterior tibial artery bypass with composite PTFE left greater saphenous vein graft 2. 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 Lajoyce Cornersuda -Dr. Lajoyce Cornersuda recommends at least 3 more days of IV ABx and then home on po ABx -dry gauze to left groin to wick moisture to help prevent  infection. -will need case management for home health needs if not already done -float heels   Doreatha MassedSamantha Lanee Chain, PA-C [facility] [phone] 02/03/2014 10:50 AM

## 2014-02-04 LAB — GLUCOSE, CAPILLARY
Glucose-Capillary: 57 mg/dL — ABNORMAL LOW (ref 70–99)
Glucose-Capillary: 75 mg/dL (ref 70–99)
Glucose-Capillary: 134 mg/dL — ABNORMAL HIGH (ref 70–99)
Glucose-Capillary: 226 mg/dL — ABNORMAL HIGH (ref 70–99)
Glucose-Capillary: 206 mg/dL — ABNORMAL HIGH (ref 70–99)

## 2014-02-04 LAB — VANCOMYCIN, TROUGH: Vancomycin Tr: 15.9 ug/mL (ref 10.0–20.0)

## 2014-02-04 LAB — APTT: aPTT: 34 seconds (ref 24–37)

## 2014-02-04 MED ORDER — VANCOMYCIN HCL IN DEXTROSE 1-5 GM/200ML-% IV SOLN
1000.0000 mg | INTRAVENOUS | Status: DC
  Administered 2014-02-05 – 2014-02-06 (×2): 1000 mg via INTRAVENOUS
  Filled 2014-02-04 (×2): qty 200

## 2014-02-04 MED ORDER — VANCOMYCIN HCL IN DEXTROSE 1-5 GM/200ML-% IV SOLN
1000.0000 mg | Freq: Once | INTRAVENOUS | Status: AC
  Administered 2014-02-04: 1000 mg via INTRAVENOUS
  Filled 2014-02-04: qty 200

## 2014-02-04 NOTE — Progress Notes (Signed)
ANTIBIOTIC CONSULT NOTE - FOLLOW UP  Pharmacy Consult for vancomycin Indication: cellulitis, s/p amputation  Recent Labs  02/03/14 2240  VANCOTROUGH 15.9      Assessment/Plan:  Vanc level therapeutic s/p amputation w/ plan to discharge on PO ABX.  Of note trough result is slightly high but last dose given late and lab drawn early, extrapolates to 13.6.  Will continue to monitor and assist w/ discharge ABX selection if needed.  Vernard GamblesVeronda Johnathyn Viscomi, PharmD, BCPS  02/04/2014,12:31 AM

## 2014-02-04 NOTE — Progress Notes (Signed)
   VASCULAR SURGERY ASSESSMENT & PLAN:  * 7 Days Post-Op s/p: left femoral to anterior tibial artery bypass with composite PTFE vein graft. Graft is patent. ABI is 96% on the left.   *Management of left foot wound per Dr. Lajoyce Cornersuda.   * Pt is to continue 2 more days of IV Abx in the hospital.  * I will arrange f/u with me as an outpt.    SUBJECTIVE: No complaints.  PHYSICAL EXAM: Filed Vitals:   02/03/14 1000 02/03/14 1734 02/03/14 2152 02/04/14 0537  BP: 146/56 131/52 130/51 136/53  Pulse: 63 71 66 69  Temp: 98 F (36.7 C) 98 F (36.7 C) 99.4 F (37.4 C) 98.8 F (37.1 C)  TempSrc: Oral Oral Oral Oral  Resp: 18 18 19 17   Height:      Weight:   245 lb 11.2 oz (111.449 kg)   SpO2: 98% 97% 98% 97%   Palpable graft pulse Incisions look good.  LABS: Lab Results  Component Value Date   WBC 17.9* 01/29/2014   HGB 8.5* 01/29/2014   HCT 25.9* 01/29/2014   MCV 83.0 01/29/2014   PLT 406* 01/29/2014   Lab Results  Component Value Date   CREATININE 2.06* 01/31/2014   Lab Results  Component Value Date   INR 1.33 01/24/2014   CBG (last 3)   Recent Labs  02/03/14 2149 02/04/14 0740 02/04/14 0809  GLUCAP 95 57* 75    Principal Problem:   Diabetic foot ulcer Active Problems:   Diabetes   Hyperlipidemia   Essential hypertension   Coronary atherosclerosis   Cerebrovascular disease   Peripheral vascular disease   PAD (peripheral artery disease)   Diabetic osteomyelitis   Acute renal failure   CKD (chronic kidney disease), stage III   Leukocytosis   Fever presenting with conditions classified elsewhere   Diarrhea   Gangrene   Diabetic ulcer of left foot associated with diabetes mellitus due to underlying condition   Type 2 diabetes mellitus with diabetic chronic kidney disease   Cari CarawayChris Sixto Bowdish Beeper: [phone] 02/04/2014

## 2014-02-04 NOTE — Care Management Note (Signed)
CARE MANAGEMENT NOTE 02/04/2014  Patient:  Jordan [name], Jordan [name]   Account Number:  [id number]  Date Initiated:  01/31/2014  Documentation initiated by:  Jasmine Pang  Subjective/Objective Assessment:   CM following for progression and d/c planning.     Action/Plan:   01/31/2014 Met with pt wife re d/c needs. Pt will need RW and 3:1 per PT recommendation, orders placed. Await MD order for [facility] services.  02/04/14 Pt now considering short term rehab at a SNF, CSW aware.   Anticipated DC Date:  02/06/2014   Anticipated DC Plan:  [place]         Choice offered to / List presented to:  C-3 Spouse   DME arranged  3-N-1  Vassie Moselle      DME agency  [place]        Status of service:   Medicare Important Message given?  YES (If response is "NO", the following Medicare IM given date fields will be blank) Date Medicare IM given:  01/31/2014 Medicare IM given by:  Kayce Chismar Date Additional Medicare IM given:  02/04/2014 Additional Medicare IM given by:  [facility]  Discharge Disposition:    Per UR Regulation:    If discussed at Long Length of Stay Meetings, dates discussed:    Comments:

## 2014-02-04 NOTE — Clinical Social Work Placement (Signed)
     Clinical Social Work Department CLINICAL SOCIAL WORK PLACEMENT NOTE 02/04/2014  Patient:  [name],Jordan  Account Number:  [id number] Admit date:  01/24/2014  Clinical Social Worker:  Lupita LeashNNA Rashiya Lofland, LCSW  Date/time:  02/04/2014 04:13 PM  Clinical Social Work is seeking post-discharge placement for this patient at the following level of care:   SKILLED NURSING   (*CSW will update this form in Epic as items are completed)   02/04/2014  Patient/family provided with [facility] Department of Clinical Social Works list of facilities offering this level of care within the geographic area requested by the patient (or if unable, by the patients family).  02/04/2014  Patient/family informed of their freedom to choose among providers that offer the needed level of care, that participate in Medicare, Medicaid or managed care program needed by the patient, have an available bed and are willing to accept the patient.  02/04/2014  Patient/family informed of [facility] ownership interest in [facility], as well as of the fact that they are under no obligation to receive care at this facility.  PASARR submitted to EDS on 02/04/2014 PASARR number received on 02/04/2014  FL2 transmitted to all facilities in geographic area requested by pt/family on  02/04/2014 FL2 transmitted to all facilities within larger geographic area on   Patient informed that his/her managed care company has contracts with or will negotiate with  certain facilities, including the following:   [employer] Medicare  (Not [employer])     Patient/family informed of bed offers received:  02/04/2014 Patient chooses bed at Other Physician recommends and patient chooses bed at    Patient to be transferred to Other on   Patient to be transferred to facility by [facility]) Patient and family notified of transfer on  Name of family member notified:    The following physician request were entered in  Epic: Physician Request  Please sign FL2.  Please prepare priority discharge summary and prescriptions.    Additional Comments: 02/05/12  [facility] Per Dr. Sharl MaLama- not stable for d/c over the weekend.  Lorri Frederickonna T. Seyed Heffley, LCSW [phone]

## 2014-02-04 NOTE — Progress Notes (Signed)
Physical Therapy Re-Evaluation Patient Details Name: Jordan [name] MRN: [id number] DOB: 1940-04-24 Today's Date: 02/04/2014    History of Present Illness Jordan [name] is a 73 y.o. 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 PMH of DM, HTN, coronary atherosclerosis, CVD, PVD, PAD, diabetic osteomyelitis, CKD stage III, leukocytosis, and gangrene. Pt now s/p L mid-foot amputation 02/03/14.     PT Comments    Pt with decline in function since mid-foot amputation on 02/03/14. Goals downgraded and discharge disposition was discussed at length with pt and wife. Pt tearful and visibly upset with his current level of function. Encouraged pt and wife to discuss option of STR at the SNF level at d/c. Feel this would be the optimal setting, however pt and wife wanting home. If pt refuses SNF, he will need an ambulance transfer home and HHPT to follow. Portion of the session was focused on education and transfer training with wife. Strongly feel pt will require another PT session for family education if discharging home this weekend. Again, PT recommending SNF at this time. Will continue to follow.   Follow Up Recommendations  Supervision/Assistance - 24 hour;SNF     Equipment Recommendations  Rolling walker with 5" wheels    Recommendations for Other Services       Precautions / Restrictions Precautions Precautions: Fall Restrictions Weight Bearing Restrictions: No    Mobility  Bed Mobility Overal bed mobility: Needs Assistance Bed Mobility: Supine to Sit     Supine to sit: Min guard     General bed mobility comments: No physical assist required. Increased time and heavy use of bed rails for transition to EOB.   Transfers Overall transfer level: Needs assistance Equipment used: Rolling walker (2 wheeled) Transfers: Sit to/from Stand Sit  to Stand: Min assist;+2 physical assistance Stand pivot transfers: Min assist;+2 physical assistance;+2 safety/equipment       General transfer comment: Bed height was elevated for assist as pt could not stand from lowest setting. Pt required almost continuous cueing to maintain weight off front of residual limb. +2 assist for balance and safety during pivotal steps around to the recliner. Practiced transfers with wife recliner<>BSC.   Ambulation/Gait             General Gait Details: Deferred due to inability to keep weight off foot and pt fatigue.    Stairs            Wheelchair Mobility    Modified Rankin (Stroke Patients Only)       Balance Overall balance assessment: Needs assistance Sitting-balance support: Feet supported;No upper extremity supported Sitting balance-Leahy Scale: Fair     Standing balance support: Bilateral upper extremity supported;During functional activity Standing balance-Leahy Scale: Poor Standing balance comment: Pt requires UE support as well as physical assist to maintain dynamic balance.                     Cognition Arousal/Alertness: Awake/alert Behavior During Therapy: WFL for tasks assessed/performed Overall Cognitive Status: Within Functional Limits for tasks assessed                      Exercises      General Comments        Pertinent Vitals/Pain Pain Assessment: No/denies pain    Home Living  Prior Function            PT Goals (current goals can now be found in the care plan section) Acute Rehab PT Goals Patient Stated Goal: Return home - get back to previous level of activity PT Goal Formulation: With patient Time For Goal Achievement: 02/05/14 Potential to Achieve Goals: Good Progress towards PT goals: Progressing toward goals    Frequency  Min 3X/week    PT Plan Current plan remains appropriate    Co-evaluation             End of Session Equipment  Utilized During Treatment: Gait belt Activity Tolerance: Patient limited by fatigue Patient left: in chair;with call bell/phone within reach;with family/visitor present     Time: [id number] PT Time Calculation (min): 43 min  Charges:  $Gait Training: 8-22 mins $Therapeutic Activity: 23-37 mins                    G Codes:      Conni SlipperKirkman, Martavion Couper 02/04/2014, 12:28 PM   Conni SlipperLaura Lane Eland, PT, DPT Acute Rehabilitation Services Pager: [phone]

## 2014-02-04 NOTE — Progress Notes (Signed)
Hypoglycemic Event  CBG: 57 at 07:40  Treatment: 15 GM carbohydrate snack  Symptoms: Shaky  Follow-up CBG: Time: 8:07 CBG Result:75  Possible Reasons for Event: Inadequate meal intake  Comments/MD notified: Dr. Sharl MaLama notified, ordered to hold morning Lantus     Noreene LarssonLarson, Kiril Hippe A  Remember to initiate Hypoglycemia Order Set & complete

## 2014-02-04 NOTE — Clinical Social Work Psychosocial (Signed)
     Clinical Social Work Department BRIEF PSYCHOSOCIAL ASSESSMENT 02/04/2014  Patient:  Jordan [name],Ho     Account Number:  [id number]     Admit date:  01/24/2014  Clinical Social Worker:  Manya SilvasROWDER,Janard Culp, LCSW  Date/Time:  02/04/2014 04:05 PM  Referred by:  Physician  Date Referred:  02/04/2014 Referred for  SNF Placement   Other Referral:   Interview type:  Other - See comment Other interview type:   Patient and wife Jordan [name]    PSYCHOSOCIAL DATA Living Status:  WIFE Admitted from facility:   Level of care:   Primary support name:  Jordan [name]  (c) [phone] Primary support relationship to patient:  SPOUSE Degree of support available:   Strong support    CURRENT CONCERNS Current Concerns  Post-Acute Placement   Other Concerns:    SOCIAL WORK ASSESSMENT / PLAN 73 year old male referred to CSW for short term SNF due to recently amputation of toes and partial foot on the left. Patient had originallky planned to return home with his wife and home health but now feels that he would benefit from short term SNF. Discussed bed search process and SNF list provided. They [facility] or [place] [facility] [place]. Bed search initiated and referal sent out. [facility] and feels that they will be able to offer a bed when stable. Per MD- no weekend d/c anticipated.  Fl2 placed on chart for MD's signature.   Assessment/plan status:  Psychosocial Support/Ongoing Assessment of Needs Other assessment/ plan:   Information/referral to community resources:   SNF provided to patient/wife    PATIENTS/FAMILYS RESPONSE TO PLAN OF CARE: Patient is alert and oriented; very pleasant gentleman who is in full agreement to need for short term SNF.  He plans to return home with his wife when medically stable after rehab.  Mrs. Jordan [name] also agrees with this plan and feels that he will benefit most from the  rehab. Discussed need for [employer] auth (not [employer]) and they agreed. Both patient and wife expressed appreciation of CSW's assistance and have a very positive attitude re: SNF needs.

## 2014-02-04 NOTE — Progress Notes (Signed)
[facility] PROGRESS NOTE Interim History: 73 y.o. male who presents with non healing wound to left foot.He was started on Keflex ~ 2 weeks ago.he developed worsening fever and chills and presented to outside hospital and was then transferred to [facility]. Started on Vanc/Zosyn. Consult ortho who rec vascular surgery consult, who recommended cardiology Dr. Fletcher Anon who rec angiogram on 10.28.2015, agree with proceeding with bypass and amputation.   Assessment/Plan:  Gangrene left foot/ sepsis - PVD:  S/p hindfoot amputation of left foot - Started on  Zosyn and Vanc 10.28.2015 consulted cardiology eval- ABI < 0.5 B/L - Angiogram on 10.28.2015 showed Flush occlusion of the left SFA  cardiology recommended surgical intervention - Amputation of the left fifth toe with Left common femoral artery to anterior tibial artery gortex bypass with composite PTFE left greater saphenous vein graft on 10.30.2015. ABI showed improvement. revision surgery with possible fourth ray amputation and possible transmetatarsal amputation.  - will de-escalate to cipro and doxy in am for 2 weeks after last revision. - pt eval rec Home health PT.  Diabetes Mellitus 2, insulin requiring  - Increase Lantuss back to 40 BID and cont Novolog-  - BG cont to improve. - A1c 8.9 BG poorly controlled. Will need aggressive treatment when ready to go home  Diarrhea  - due to antibiotics - c diff negative. - now resolved.  Essential hypertension  - Increase Imdur, cont Norvasc, Toprol   Coronary atherosclerosis  Cont ASA   Peripheral vascular disease  - cont ASA- vascular sx consulted. - see above for further details.  CKD (chronic kidney disease), stage III  - Cr increased stabilize. - will start slow IVF and follow B met   Code Status: Full code  Family Communication: none  Disposition Plan: to be determined  DVT prophylaxis:  Lovenox    Consultants:  Ortho  Vascular  cardio  Procedures:  Angiogram 10.287.2015  Antibiotics:  vanc zosyn  HPI/Subjective: Patient seen denies any complaints, s/p surgery.  Objective: Filed Vitals:   02/03/14 1734 02/03/14 2152 02/04/14 0537 02/04/14 1000  BP: 131/52 130/51 136/53 152/64  Pulse: 71 66 69 75  Temp: 98 F (36.7 C) 99.4 F (37.4 C) 98.8 F (37.1 C) 98.2 F (36.8 C)  TempSrc: Oral Oral Oral Oral  Resp: '18 19 17 18  ' Height:      Weight:  111.449 kg (245 lb 11.2 oz)    SpO2: 97% 98% 97% 98%    Intake/Output Summary (Last 24 hours) at 02/04/14 1525 Last data filed at 02/04/14 0900  Gross per 24 hour  Intake    600 ml  Output      0 ml  Net    600 ml   Filed Weights   02/01/14 2033 02/02/14 2055 02/03/14 2152  Weight: 107.956 kg (238 lb) 108.5 kg (239 lb 3.2 oz) 111.449 kg (245 lb 11.2 oz)    Exam:  Physical Exam: Head: Normocephalic, atraumatic.  Eyes: No signs of jaundice, EOMI Lungs: Normal respiratory effort. B/L Clear to auscultation, no crackles or wheezes.  Heart: Regular RR. S1 and S2 normal  Abdomen: BS normoactive. Soft, Nondistended, non-tender.  Extremities: Left foot in dressing     Data Reviewed: Basic Metabolic Panel:  Recent Labs Lab 01/29/14 0354 01/31/14 0520  NA 141 146  K 4.5 3.8  CL 104 110  CO2 23 23  GLUCOSE 225* 173*  BUN 21 27*  CREATININE 1.69* 2.06*  CALCIUM 7.9* 8.0*   Liver Function Tests:  No results for input(s): AST, ALT, ALKPHOS, BILITOT, PROT, ALBUMIN in the last 168 hours. No results for input(s): LIPASE, AMYLASE in the last 168 hours. No results for input(s): AMMONIA in the last 168 hours. CBC:  Recent Labs Lab 01/29/14 0354  WBC 17.9*  HGB 8.5*  HCT 25.9*  MCV 83.0  PLT 406*   Cardiac Enzymes: No results for input(s): CKTOTAL, CKMB, CKMBINDEX, TROPONINI in the last 168 hours. BNP (last 3 results) No results for input(s): PROBNP in the last 8760 hours. CBG:  Recent  Labs Lab 02/03/14 1625 02/03/14 2149 02/04/14 0740 02/04/14 0809 02/04/14 1153  GLUCAP 124* 95 57* 75 134*    Recent Results (from the past 240 hour(s))  Surgical PCR screen     Status: None   Collection Time: 01/27/14 10:56 PM  Result Value Ref Range Status   MRSA, PCR NEGATIVE NEGATIVE Final   Staphylococcus aureus NEGATIVE NEGATIVE Final    Comment:        The Xpert SA Assay (FDA approved for NASAL specimens in patients over 85 years of age), is one component of a comprehensive surveillance program.  Test performance has been validated by [employer] for patients greater than or equal to 97 year old. It is not intended to diagnose infection nor to guide or monitor treatment.  Wound culture     Status: None   Collection Time: 01/28/14  3:29 PM  Result Value Ref Range Status   Specimen Description WOUND LEFT TOE  Final   Special Requests POF ZISTN AND VANCOMYCIN  Final   Gram Stain   Final    FEW WBC PRESENT,BOTH PMN AND MONONUCLEAR NO SQUAMOUS EPITHELIAL CELLS SEEN NO ORGANISMS SEEN Performed at [facility]    Culture   Final    FEW MICROAEROPHILIC STREPTOCOCCI Note: Standardized susceptibility testing for this organism is not available. Performed at [facility]    Report Status 01/30/2014 FINAL  Final  Anaerobic culture     Status: None   Collection Time: 01/28/14  3:29 PM  Result Value Ref Range Status   Specimen Description WOUND LEFT TOE  Final   Special Requests POF ZOSYN AND VANCOMYCIN  Final   Gram Stain   Final    FEW WBC PRESENT,BOTH PMN AND MONONUCLEAR NO SQUAMOUS EPITHELIAL CELLS SEEN NO ORGANISMS SEEN Performed at [facility]    Culture   Final    NO ANAEROBES ISOLATED Performed at [facility]    Report Status 02/02/2014 FINAL  Final  Surgical pcr screen     Status: None   Collection Time: 02/01/14  7:49 PM  Result Value Ref Range Status   MRSA, PCR NEGATIVE NEGATIVE Final   Staphylococcus aureus  NEGATIVE NEGATIVE Final    Comment:        The Xpert SA Assay (FDA approved for NASAL specimens in patients over 45 years of age), is one component of a comprehensive surveillance program.  Test performance has been validated by [employer] for patients greater than or equal to 53 year old. It is not intended to diagnose infection nor to guide or monitor treatment.      Studies: No results found.  Scheduled Meds: . amLODipine  10 mg Oral Daily  . aspirin  81 mg Oral Daily  . docusate sodium  100 mg Oral BID  . enoxaparin (LOVENOX) injection  40 mg Subcutaneous Q24H  . feeding supplement (GLUCERNA SHAKE)  237 mL Oral BID BM  . feeding supplement (  PRO-STAT SUGAR FREE 64)  30 mL Oral BID BM  . insulin aspart  0-20 Units Subcutaneous TID WC  . insulin aspart  0-5 Units Subcutaneous QHS  . insulin glargine  40 Units Subcutaneous BID  . isosorbide mononitrate  60 mg Oral Daily  . metoprolol succinate  100 mg Oral BID  . pantoprazole  40 mg Oral Daily  . piperacillin-tazobactam (ZOSYN)  IV  3.375 g Intravenous Q8H  . rosuvastatin  40 mg Oral Daily  . [START ON 02/05/2014] vancomycin  1,000 mg Intravenous Q24H   Continuous Infusions: . sodium chloride 10 mL/hr at 02/02/14 1724  . lactated ringers 10 mL/hr at 02/02/14 [facility] Pager [phone]. If 8PM-8AM, please contact night-coverage at [email or web address], password [facility] 02/04/2014, 3:25 PM  LOS: 11 days

## 2014-02-05 DIAGNOSIS — E119 Type 2 diabetes mellitus without complications: Secondary | ICD-10-CM

## 2014-02-05 LAB — GLUCOSE, CAPILLARY
Glucose-Capillary: 93 mg/dL (ref 70–99)
Glucose-Capillary: 150 mg/dL — ABNORMAL HIGH (ref 70–99)
Glucose-Capillary: 162 mg/dL — ABNORMAL HIGH (ref 70–99)
Glucose-Capillary: 101 mg/dL — ABNORMAL HIGH (ref 70–99)

## 2014-02-05 LAB — APTT: aPTT: 38 seconds — ABNORMAL HIGH (ref 24–37)

## 2014-02-05 MED ORDER — MIRTAZAPINE 7.5 MG PO TABS
7.5000 mg | ORAL_TABLET | Freq: Every day | ORAL | Status: DC
  Administered 2014-02-05 – 2014-02-06 (×2): 7.5 mg via ORAL
  Filled 2014-02-05 (×3): qty 1

## 2014-02-05 NOTE — Plan of Care (Signed)
Problem: Consults Goal: Skin Care Protocol Initiated - if Braden Score 18 or less If consults are not indicated, leave blank or document N/A  Outcome: Completed/Met Date Met:  02/05/14 Goal: Nutrition Consult-if indicated Outcome: Completed/Met Date Met:  02/05/14 Goal: Diabetes Guidelines if Diabetic/Glucose > 140 If diabetic or lab glucose is > 140 mg/dl - Initiate Diabetes/Hyperglycemia Guidelines & Document Interventions  Outcome: Completed/Met Date Met:  02/05/14

## 2014-02-05 NOTE — Plan of Care (Signed)
Problem: Phase I Progression Outcomes Goal: Voiding-avoid urinary catheter unless indicated Outcome: Completed/Met Date Met:  02/05/14  Problem: Phase II Progression Outcomes Goal: Surgical site without signs of infection Outcome: Completed/Met Date Met:  02/05/14 Goal: Sutures/staples intact Outcome: Completed/Met Date Met:  02/05/14 Goal: Return of bowel function (flatus, BM) IF ABDOMINAL SURGERY:  Outcome: Completed/Met Date Met:  02/05/14

## 2014-02-05 NOTE — Plan of Care (Signed)
Problem: Consults Goal: General Medical Patient Education See Patient Education Module for specific education.  Outcome: Completed/Met Date Met:  02/05/14 Educated patient and wife on amputation, weight bearing restrictions, signs of infection, and antibiotic therapy.  Patient and wife verbalized understanding.  Educated patient and wife on Remeron and its use as appetite stimulant.

## 2014-02-05 NOTE — Progress Notes (Signed)
[facility] PROGRESS NOTE Interim History: 73 y.o. male who presents with non healing wound to left foot.He was started on Keflex ~ 2 weeks ago.he developed worsening fever and chills and presented to outside hospital and was then transferred to [facility]. Started on Vanc/Zosyn. Consult ortho who rec vascular surgery consult, who recommended cardiology Dr. Fletcher Anon who rec angiogram on 10.28.2015, agree with proceeding with bypass and amputation.   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 Started on vancomycin and Zosyn  consulted cardiology eval- ABI < 0.5 B/L - Angiogram on 10.28.2015 showed Flush occlusion of the left SFA  cardiology recommended surgical intervention - Amputation of the left fifth toe with Left common femoral artery to anterior tibial artery gortex bypass with composite PTFE left greater saphenous vein graft on 10.30.2015. ABI showed improvement. revision surgery with possible fourth ray amputation and possible transmetatarsal amputation.  - orthopedics recommend to continue antibiotics for 3 days postop, and then switch to by mouth antibiotics  Depression Patient was diagnosed with depression by his PCP, did not take SSRI which was offered to him. Would like to start a medication which will also help appetite. I will start the patient on low-dose Remeron 7.5 mg by mouth daily at bedtime.  Diabetes Mellitus 2, insulin requiring  - Continue Lantus 20 units subcut bid. - BG cont to improve. - A1c 8.9 BG poorly controlled. Will need aggressive treatment when ready to go home  Diarrhea  - due to antibiotics - c diff negative. - now resolved.  Essential hypertension  - Increase Imdur, cont Norvasc, Toprol   Coronary atherosclerosis  Cont ASA   Peripheral vascular disease  - cont ASA- vascular sx consulted. - see above for further details.  CKD (chronic  kidney disease), stage III  - Cr increased stabilize. - will start slow IVF and follow B met   Code Status: Full code  Family Communication: none  Disposition Plan: to be determined  DVT prophylaxis: Lovenox    Consultants:  Ortho  Vascular  cardio  Procedures:  Angiogram 10.287.2015  Antibiotics:  vanc zosyn  HPI/Subjective: Patient seen and examined, complains of poor appetite, depression. Patient was diagnosed with depression by his PCP and was offered SSRI which patient refused. No patient and wife would like to try medication for depression and appetite stimulation.  Objective: Filed Vitals:   02/04/14 1000 02/04/14 1742 02/04/14 2125 02/05/14 0513  BP: 152/64 124/57 143/51 144/65  Pulse: 75 76 80 73  Temp: 98.2 F (36.8 C) 98.2 F (36.8 C) 98.7 F (37.1 C) 97.8 F (36.6 C)  TempSrc: Oral Oral Oral Oral  Resp: '18 18 17 16  ' Height:      Weight:   104.736 kg (230 lb 14.4 oz)   SpO2: 98% 96% 98% 98%    Intake/Output Summary (Last 24 hours) at 02/05/14 1249 Last data filed at 02/05/14 0500  Gross per 24 hour  Intake    480 ml  Output    300 ml  Net    180 ml   Filed Weights   02/02/14 2055 02/03/14 2152 02/04/14 2125  Weight: 108.5 kg (239 lb 3.2 oz) 111.449 kg (245 lb 11.2 oz) 104.736 kg (230 lb 14.4 oz)    Exam:  Physical Exam: Head: Normocephalic, atraumatic.  Eyes: No signs of jaundice, EOMI Lungs: Normal respiratory effort. B/L Clear to auscultation, no crackles or wheezes.  Heart:  Regular RR. S1 and S2 normal  Abdomen: BS normoactive. Soft, Nondistended, non-tender.  Extremities: Left foot in dressing     Data Reviewed: Basic Metabolic Panel:  Recent Labs Lab 01/31/14 0520  NA 146  K 3.8  CL 110  CO2 23  GLUCOSE 173*  BUN 27*  CREATININE 2.06*  CALCIUM 8.0*   Liver Function Tests: No results for input(s): AST, ALT, ALKPHOS, BILITOT, PROT, ALBUMIN in the last 168 hours. No results for input(s): LIPASE, AMYLASE in the last  168 hours. No results for input(s): AMMONIA in the last 168 hours. CBC: No results for input(s): WBC, NEUTROABS, HGB, HCT, MCV, PLT in the last 168 hours. Cardiac Enzymes: No results for input(s): CKTOTAL, CKMB, CKMBINDEX, TROPONINI in the last 168 hours. BNP (last 3 results) No results for input(s): PROBNP in the last 8760 hours. CBG:  Recent Labs Lab 02/04/14 1153 02/04/14 1659 02/04/14 2117 02/05/14 0801 02/05/14 1159  GLUCAP 134* 226* 206* 93 150*    Recent Results (from the past 240 hour(s))  Surgical PCR screen     Status: None   Collection Time: 01/27/14 10:56 PM  Result Value Ref Range Status   MRSA, PCR NEGATIVE NEGATIVE Final   Staphylococcus aureus NEGATIVE NEGATIVE Final    Comment:        The Xpert SA Assay (FDA approved for NASAL specimens in patients over 28 years of age), is one component of a comprehensive surveillance program.  Test performance has been validated by [employer] for patients greater than or equal to 35 year old. It is not intended to diagnose infection nor to guide or monitor treatment.  Wound culture     Status: None   Collection Time: 01/28/14  3:29 PM  Result Value Ref Range Status   Specimen Description WOUND LEFT TOE  Final   Special Requests POF ZISTN AND VANCOMYCIN  Final   Gram Stain   Final    FEW WBC PRESENT,BOTH PMN AND MONONUCLEAR NO SQUAMOUS EPITHELIAL CELLS SEEN NO ORGANISMS SEEN Performed at [facility]    Culture   Final    FEW MICROAEROPHILIC STREPTOCOCCI Note: Standardized susceptibility testing for this organism is not available. Performed at [facility]    Report Status 01/30/2014 FINAL  Final  Anaerobic culture     Status: None   Collection Time: 01/28/14  3:29 PM  Result Value Ref Range Status   Specimen Description WOUND LEFT TOE  Final   Special Requests POF ZOSYN AND VANCOMYCIN  Final   Gram Stain   Final    FEW WBC PRESENT,BOTH PMN AND MONONUCLEAR NO SQUAMOUS EPITHELIAL CELLS  SEEN NO ORGANISMS SEEN Performed at [facility]    Culture   Final    NO ANAEROBES ISOLATED Performed at [facility]    Report Status 02/02/2014 FINAL  Final  Surgical pcr screen     Status: None   Collection Time: 02/01/14  7:49 PM  Result Value Ref Range Status   MRSA, PCR NEGATIVE NEGATIVE Final   Staphylococcus aureus NEGATIVE NEGATIVE Final    Comment:        The Xpert SA Assay (FDA approved for NASAL specimens in patients over 17 years of age), is one component of a comprehensive surveillance program.  Test performance has been validated by [employer] for patients greater than or equal to 45 year old. It is not intended to diagnose infection nor to guide or monitor treatment.      Studies: No  results found.  Scheduled Meds: . amLODipine  10 mg Oral Daily  . aspirin  81 mg Oral Daily  . docusate sodium  100 mg Oral BID  . enoxaparin (LOVENOX) injection  40 mg Subcutaneous Q24H  . feeding supplement (GLUCERNA SHAKE)  237 mL Oral BID BM  . feeding supplement (PRO-STAT SUGAR FREE 64)  30 mL Oral BID BM  . insulin aspart  0-20 Units Subcutaneous TID WC  . insulin aspart  0-5 Units Subcutaneous QHS  . insulin glargine  40 Units Subcutaneous BID  . isosorbide mononitrate  60 mg Oral Daily  . metoprolol succinate  100 mg Oral BID  . mirtazapine  7.5 mg Oral QHS  . pantoprazole  40 mg Oral Daily  . piperacillin-tazobactam (ZOSYN)  IV  3.375 g Intravenous Q8H  . rosuvastatin  40 mg Oral Daily  . vancomycin  1,000 mg Intravenous Q24H   Continuous Infusions: . sodium chloride 10 mL/hr at 02/02/14 1724  . lactated ringers 10 mL/hr at 02/02/14 [place] [facility] Pager [phone]. If 8PM-8AM, please contact night-coverage at [email or web address], password [facility] 02/05/2014, 12:49 PM  LOS: 12 days

## 2014-02-06 LAB — GLUCOSE, CAPILLARY
Glucose-Capillary: 60 mg/dL — ABNORMAL LOW (ref 70–99)
Glucose-Capillary: 88 mg/dL (ref 70–99)
Glucose-Capillary: 109 mg/dL — ABNORMAL HIGH (ref 70–99)
Glucose-Capillary: 229 mg/dL — ABNORMAL HIGH (ref 70–99)
Glucose-Capillary: 235 mg/dL — ABNORMAL HIGH (ref 70–99)

## 2014-02-06 LAB — APTT: aPTT: 37 seconds (ref 24–37)

## 2014-02-06 MED ORDER — DOXYCYCLINE HYCLATE 100 MG PO TABS
100.0000 mg | ORAL_TABLET | Freq: Two times a day (BID) | ORAL | Status: DC
  Administered 2014-02-06 – 2014-02-07 (×3): 100 mg via ORAL
  Filled 2014-02-06 (×4): qty 1

## 2014-02-06 MED ORDER — INSULIN GLARGINE 100 UNIT/ML ~~LOC~~ SOLN
20.0000 [IU] | Freq: Two times a day (BID) | SUBCUTANEOUS | Status: DC
Start: 2014-02-06 — End: 2014-02-07
  Administered 2014-02-06 – 2014-02-07 (×3): 20 [IU] via SUBCUTANEOUS
  Filled 2014-02-06 (×4): qty 0.2

## 2014-02-06 MED ORDER — LEVOFLOXACIN 750 MG PO TABS
750.0000 mg | ORAL_TABLET | ORAL | Status: DC
  Administered 2014-02-06: 750 mg via ORAL
  Filled 2014-02-06: qty 1

## 2014-02-06 NOTE — Progress Notes (Signed)
Hypoglycemic Event  CBG: 60   Treatment: 15 GM carbohydrate snack  Symptoms: None  Follow-up CBG: Time: 08:27 CBG Result: 88  Possible Reasons for Event: Medication regimen:  Lantus decreased  Comments/MD notified: Dr. Sharl MaLama notified, decreased Lantus     Jordan [name], Jordan [name]  Remember to initiate Hypoglycemia Order Set & complete

## 2014-02-06 NOTE — Social Work (Signed)
CSW met with patient and his wife. CSW shared that patient has a bed offer at [facility]. Wife stated that they also received a bed offer from [facility] in [place]. [facility] is their preference. Wife stated that they are awating a call from [facility] on Monday to finalize.  Weekday CSW will follow up.  Christene Lye MSW, [place]

## 2014-02-06 NOTE — Plan of Care (Signed)
Problem: Phase II Progression Outcomes Goal: Discharge plan established Outcome: Completed/Met Date Met:  02/06/14  Problem: Phase III Progression Outcomes Goal: Activity at appropriate level-compared to baseline (UP IN CHAIR FOR HEMODIALYSIS)  Outcome: Completed/Met Date Met:  02/06/14

## 2014-02-06 NOTE — Progress Notes (Signed)
Subjective: 4 Days Post-Op Procedure(s) (LRB): Left 4th Ray Amputation vs Transmetatarsal Amputation (Left) Patient reports pain as mild.    Objective: Vital signs in last 24 hours: Temp:  [98.4 F (36.9 C)-99.4 F (37.4 C)] 98.7 F (37.1 C) (11/08 1100) Pulse Rate:  [65-71] 65 (11/08 1100) Resp:  [16-18] 18 (11/08 1100) BP: (133-153)/(53-72) 133/57 mmHg (11/08 1100) SpO2:  [95 %-100 %] 98 % (11/08 1100) Weight:  [104.055 kg (229 lb 6.4 oz)] 104.055 kg (229 lb 6.4 oz) (11/07 2127)  Intake/Output from previous day: 11/07 0701 - 11/08 0700 In: 600 [P.O.:600] Out: 1400 [Urine:1400] Intake/Output this shift: Total I/O In: 120 [P.O.:120] Out: -   No results for input(s): HGB in the last 72 hours. No results for input(s): WBC, RBC, HCT, PLT in the last 72 hours. No results for input(s): NA, K, CL, CO2, BUN, CREATININE, GLUCOSE, CALCIUM in the last 72 hours. No results for input(s): LABPT, INR in the last 72 hours.  Assessment/Plan: 4 Days Post-Op Procedure(s) (LRB): Left 4th Ray Amputation vs Transmetatarsal Amputation (Left) Possible rehab tomorrow in [place] area, dressing to left foot clean, dry  Janese Radabaugh W 02/06/2014, 11:27 AM

## 2014-02-06 NOTE — Progress Notes (Signed)
[facility] PROGRESS NOTE Interim History: 73 y.o. male who presents with non healing wound to left foot.He was started on Keflex ~ 2 weeks ago.he developed worsening fever and chills and presented to outside hospital and was then transferred to [facility]. Started on Vanc/Zosyn. Consult ortho who rec vascular surgery consult, who recommended cardiology Dr. Fletcher Anon who rec angiogram on 10.28.2015, agree with proceeding with bypass and amputation.   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 Started on vancomycin and Zosyn  consulted cardiology eval- ABI < 0.5 B/L - Angiogram on 10.28.2015 showed Flush occlusion of the left SFA  cardiology recommended surgical intervention - Amputation of the left fifth toe with Left common femoral artery to anterior tibial artery gortex bypass with composite PTFE left greater saphenous vein graft on 10.30.2015. ABI showed improvement. revision surgery with possible fourth ray amputation and possible transmetatarsal amputation.  - orthopedics recommend to continue antibiotics for 3 days postop, and then switch to by mouth antibiotics  Depression Patient was diagnosed with depression by his PCP, did not take SSRI which was offered to him. Would like to start a medication which will also help appetite. I will start the patient on low-dose Remeron 7.5 mg by mouth daily at bedtime.  Diabetes Mellitus 2, insulin requiring  - Continue Lantus 20 units subcut bid. - BG cont to improve. - A1c 8.9 BG poorly controlled. Will need aggressive treatment when ready to go home  Diarrhea  - due to antibiotics - c diff negative. - now resolved.  Essential hypertension  - Increase Imdur, cont Norvasc, Toprol   Coronary atherosclerosis  Cont ASA   Peripheral vascular disease  - cont ASA- vascular sx consulted. - see above for further details.  CKD (chronic  kidney disease), stage III  - Cr increased stabilize. - will start slow IVF and follow B met   Code Status: Full code  Family Communication: none  Disposition Plan: to be determined  DVT prophylaxis: Lovenox    Consultants:  Ortho  Vascular  cardio  Procedures:  Angiogram 10.287.2015  Antibiotics:  vanc zosyn  HPI/Subjective: Patient seen and examined, complains of poor appetite, depression. Patient was diagnosed with depression by his PCP and was offered SSRI which patient refused. Both patient and wife would like to try medication for depression and appetite stimulation.  Objective: Filed Vitals:   02/05/14 1826 02/05/14 2127 02/06/14 0631 02/06/14 1100  BP: 137/53 144/59 153/72 133/57  Pulse: 66 71 67 65  Temp: 98.4 F (36.9 C) 99.4 F (37.4 C) 98.7 F (37.1 C) 98.7 F (37.1 C)  TempSrc: Oral Oral Oral Oral  Resp: '18 17 16 18  ' Height:      Weight:  104.055 kg (229 lb 6.4 oz)    SpO2: 100% 97% 95% 98%    Intake/Output Summary (Last 24 hours) at 02/06/14 1406 Last data filed at 02/06/14 0900  Gross per 24 hour  Intake    360 ml  Output      0 ml  Net    360 ml   Filed Weights   02/03/14 2152 02/04/14 2125 02/05/14 2127  Weight: 111.449 kg (245 lb 11.2 oz) 104.736 kg (230 lb 14.4 oz) 104.055 kg (229 lb 6.4 oz)    Exam:  Physical Exam: Head: Normocephalic, atraumatic.  Eyes: No signs of jaundice, EOMI Lungs: Normal respiratory effort. B/L Clear to auscultation, no crackles or wheezes.  Heart: Regular RR. S1 and S2 normal  Abdomen: BS normoactive. Soft, Nondistended, non-tender.  Extremities: Left foot in dressing     Data Reviewed: Basic Metabolic Panel:  Recent Labs Lab 01/31/14 0520  NA 146  K 3.8  CL 110  CO2 23  GLUCOSE 173*  BUN 27*  CREATININE 2.06*  CALCIUM 8.0*   Liver Function Tests: No results for input(s): AST, ALT, ALKPHOS, BILITOT, PROT, ALBUMIN in the last 168 hours. No results for input(s): LIPASE, AMYLASE in the  last 168 hours. No results for input(s): AMMONIA in the last 168 hours. CBC: No results for input(s): WBC, NEUTROABS, HGB, HCT, MCV, PLT in the last 168 hours. Cardiac Enzymes: No results for input(s): CKTOTAL, CKMB, CKMBINDEX, TROPONINI in the last 168 hours. BNP (last 3 results) No results for input(s): PROBNP in the last 8760 hours. CBG:  Recent Labs Lab 02/05/14 1645 02/05/14 2123 02/06/14 0759 02/06/14 0827 02/06/14 1144  GLUCAP 162* 101* 60* 88 109*    Recent Results (from the past 240 hour(s))  Surgical PCR screen     Status: None   Collection Time: 01/27/14 10:56 PM  Result Value Ref Range Status   MRSA, PCR NEGATIVE NEGATIVE Final   Staphylococcus aureus NEGATIVE NEGATIVE Final    Comment:        The Xpert SA Assay (FDA approved for NASAL specimens in patients over 21 years of age), is one component of a comprehensive surveillance program.  Test performance has been validated by [employer] for patients greater than or equal to 38 year old. It is not intended to diagnose infection nor to guide or monitor treatment.  Wound culture     Status: None   Collection Time: 01/28/14  3:29 PM  Result Value Ref Range Status   Specimen Description WOUND LEFT TOE  Final   Special Requests POF ZISTN AND VANCOMYCIN  Final   Gram Stain   Final    FEW WBC PRESENT,BOTH PMN AND MONONUCLEAR NO SQUAMOUS EPITHELIAL CELLS SEEN NO ORGANISMS SEEN Performed at [facility]    Culture   Final    FEW MICROAEROPHILIC STREPTOCOCCI Note: Standardized susceptibility testing for this organism is not available. Performed at [facility]    Report Status 01/30/2014 FINAL  Final  Anaerobic culture     Status: None   Collection Time: 01/28/14  3:29 PM  Result Value Ref Range Status   Specimen Description WOUND LEFT TOE  Final   Special Requests POF ZOSYN AND VANCOMYCIN  Final   Gram Stain   Final    FEW WBC PRESENT,BOTH PMN AND MONONUCLEAR NO SQUAMOUS EPITHELIAL  CELLS SEEN NO ORGANISMS SEEN Performed at [facility]    Culture   Final    NO ANAEROBES ISOLATED Performed at [facility]    Report Status 02/02/2014 FINAL  Final  Surgical pcr screen     Status: None   Collection Time: 02/01/14  7:49 PM  Result Value Ref Range Status   MRSA, PCR NEGATIVE NEGATIVE Final   Staphylococcus aureus NEGATIVE NEGATIVE Final    Comment:        The Xpert SA Assay (FDA approved for NASAL specimens in patients over 54 years of age), is one component of a comprehensive surveillance program.  Test performance has been validated by [employer] for patients greater than or equal to 30 year old. It is not intended to diagnose infection nor to guide or monitor treatment.      Studies:  No results found.  Scheduled Meds: . amLODipine  10 mg Oral Daily  . aspirin  81 mg Oral Daily  . docusate sodium  100 mg Oral BID  . doxycycline  100 mg Oral Q12H  . enoxaparin (LOVENOX) injection  40 mg Subcutaneous Q24H  . feeding supplement (GLUCERNA SHAKE)  237 mL Oral BID BM  . feeding supplement (PRO-STAT SUGAR FREE 64)  30 mL Oral BID BM  . insulin aspart  0-20 Units Subcutaneous TID WC  . insulin aspart  0-5 Units Subcutaneous QHS  . insulin glargine  20 Units Subcutaneous BID  . isosorbide mononitrate  60 mg Oral Daily  . levofloxacin  750 mg Oral Q48H  . metoprolol succinate  100 mg Oral BID  . mirtazapine  7.5 mg Oral QHS  . pantoprazole  40 mg Oral Daily  . rosuvastatin  40 mg Oral Daily   Continuous Infusions: . sodium chloride 10 mL/hr at 02/02/14 1724  . lactated ringers 10 mL/hr at 02/02/14 [facility] Pager [phone]. If 8PM-8AM, please contact night-coverage at [email or web address], password [facility] 02/06/2014, 2:06 PM  LOS: 13 days

## 2014-02-07 LAB — CBC
HCT: 28.1 % — ABNORMAL LOW (ref 39.0–52.0)
Hemoglobin: 9 g/dL — ABNORMAL LOW (ref 13.0–17.0)
MCH: 27.6 pg (ref 26.0–34.0)
MCHC: 32 g/dL (ref 30.0–36.0)
MCV: 86.2 fL (ref 78.0–100.0)
Platelets: 571 10*3/uL — ABNORMAL HIGH (ref 150–400)
RBC: 3.26 MIL/uL — ABNORMAL LOW (ref 4.22–5.81)
RDW: 15.4 % (ref 11.5–15.5)
WBC: 10.9 10*3/uL — ABNORMAL HIGH (ref 4.0–10.5)

## 2014-02-07 LAB — APTT: aPTT: 39 seconds — ABNORMAL HIGH (ref 24–37)

## 2014-02-07 LAB — BASIC METABOLIC PANEL
Anion gap: 13 (ref 5–15)
BUN: 18 mg/dL (ref 6–23)
CO2: 26 mEq/L (ref 19–32)
Calcium: 8.7 mg/dL (ref 8.4–10.5)
Chloride: 104 mEq/L (ref 96–112)
Creatinine, Ser: 1.77 mg/dL — ABNORMAL HIGH (ref 0.50–1.35)
GFR calc Af Amer: 42 mL/min — ABNORMAL LOW (ref >90)
GFR calc non Af Amer: 36 mL/min — ABNORMAL LOW (ref >90)
Glucose, Bld: 74 mg/dL (ref 70–99)
Potassium: 3.5 mEq/L — ABNORMAL LOW (ref 3.7–5.3)
Sodium: 143 mEq/L (ref 137–147)

## 2014-02-07 LAB — GLUCOSE, CAPILLARY
Glucose-Capillary: 135 mg/dL — ABNORMAL HIGH (ref 70–99)
Glucose-Capillary: 154 mg/dL — ABNORMAL HIGH (ref 70–99)
Glucose-Capillary: 205 mg/dL — ABNORMAL HIGH (ref 70–99)

## 2014-02-07 MED ORDER — GLUCERNA SHAKE PO LIQD: 237.0000 mL | Freq: Two times a day (BID) | ORAL | Status: DC

## 2014-02-07 MED ORDER — LEVOFLOXACIN 750 MG PO TABS: 750.0000 mg | ORAL_TABLET | ORAL | Status: AC

## 2014-02-07 MED ORDER — POTASSIUM CHLORIDE CRYS ER 20 MEQ PO TBCR
40.0000 meq | EXTENDED_RELEASE_TABLET | Freq: Once | ORAL | Status: AC
  Administered 2014-02-07: 40 meq via ORAL

## 2014-02-07 MED ORDER — MIRTAZAPINE 7.5 MG PO TABS: 7.5000 mg | ORAL_TABLET | Freq: Every day | ORAL | Status: DC

## 2014-02-07 MED ORDER — ONDANSETRON HCL 4 MG PO TABS: 4.0000 mg | ORAL_TABLET | Freq: Four times a day (QID) | ORAL | Status: DC | PRN

## 2014-02-07 MED ORDER — PANTOPRAZOLE SODIUM 40 MG PO TBEC: 40.0000 mg | DELAYED_RELEASE_TABLET | Freq: Every day | ORAL | Status: DC

## 2014-02-07 MED ORDER — ALBUTEROL SULFATE (2.5 MG/3ML) 0.083% IN NEBU: 2.5000 mg | INHALATION_SOLUTION | Freq: Four times a day (QID) | RESPIRATORY_TRACT | Status: DC | PRN

## 2014-02-07 MED ORDER — DSS 100 MG PO CAPS
100.0000 mg | ORAL_CAPSULE | Freq: Two times a day (BID) | ORAL | Status: DC
Start: 2014-02-07 — End: 2016-06-26

## 2014-02-07 MED ORDER — INSULIN ASPART 100 UNIT/ML ~~LOC~~ SOLN: 0.0000 [IU] | Freq: Three times a day (TID) | SUBCUTANEOUS | Status: DC

## 2014-02-07 MED ORDER — PRO-STAT SUGAR FREE PO LIQD: 30.0000 mL | Freq: Two times a day (BID) | ORAL | Status: DC

## 2014-02-07 MED ORDER — DOXYCYCLINE HYCLATE 100 MG PO TABS: 100.0000 mg | ORAL_TABLET | Freq: Two times a day (BID) | ORAL | Status: AC

## 2014-02-07 MED ORDER — ACETAMINOPHEN 325 MG PO TABS: 650.0000 mg | ORAL_TABLET | Freq: Four times a day (QID) | ORAL | Status: DC | PRN

## 2014-02-07 MED ORDER — INSULIN GLARGINE 100 UNIT/ML ~~LOC~~ SOLN: 25.0000 [IU] | Freq: Every day | SUBCUTANEOUS | Status: DC

## 2014-02-07 MED ORDER — INSULIN GLARGINE 100 UNIT/ML ~~LOC~~ SOLN: 20.0000 [IU] | Freq: Every day | SUBCUTANEOUS | Status: DC

## 2014-02-07 MED ORDER — OXYCODONE-ACETAMINOPHEN 5-325 MG PO TABS: 1.0000 | ORAL_TABLET | Freq: Four times a day (QID) | ORAL | Status: DC | PRN

## 2014-02-07 NOTE — Progress Notes (Signed)
  [facility] Progress Note  02/07/2014 8:55 AM 5 Days Post-Op  Subjective:  Doing well. No complaints.   Filed Vitals:   02/07/14 0443  BP: 137/56  Pulse: 69  Temp: 98.5 F (36.9 C)  Resp: 18    Physical Exam: Incisions:  Left leg incisions healing well. Dressing clean to left amputation site.    CBC    Component Value Date/Time   WBC 17.9* 01/29/2014 0354   RBC 3.12* 01/29/2014 0354   HGB 8.5* 01/29/2014 0354   HCT 25.9* 01/29/2014 0354   PLT 406* 01/29/2014 0354   MCV 83.0 01/29/2014 0354   MCH 27.2 01/29/2014 0354   MCHC 32.8 01/29/2014 0354   RDW 14.9 01/29/2014 0354   LYMPHSABS 1.7 04/02/2013 0950   MONOABS 0.6 04/02/2013 0950   EOSABS 0.3 04/02/2013 0950   BASOSABS 0.0 04/02/2013 0950    BMET    Component Value Date/Time   NA 146 01/31/2014 0520   K 3.8 01/31/2014 0520   CL 110 01/31/2014 0520   CO2 23 01/31/2014 0520   GLUCOSE 173* 01/31/2014 0520   BUN 27* 01/31/2014 0520   CREATININE 2.06* 01/31/2014 0520   CREATININE 1.27 12/19/2010 1702   CREATININE 1.27 12/19/2010 1702   CALCIUM 8.0* 01/31/2014 0520   GFRNONAA 30* 01/31/2014 0520   GFRAA 35* 01/31/2014 0520    INR    Component Value Date/Time   INR 1.33 01/24/2014 0535     Intake/Output Summary (Last 24 hours) at 02/07/14 0855 Last data filed at 02/07/14 0444  Gross per 24 hour  Intake    600 ml  Output    450 ml  Net    150 ml     Assessment:  73 y.o. male is s/p: Left common femoral artery to anterior tibial artery bypass with composite PTFE left greater saphenous vein graft 01/28/14  [place] amputation left foot (Dr. Lajoyce Cornersuda) 5 Days Post-Op  Plan: -Bypass graft is patent. Incisions healing well.  -Amputation management per Dr. Lajoyce Cornersuda. Says he is ok for SNF today.  -Will follow up in office with Dr. Edilia Boickson in 2 weeks.   Maris BergerKimberly Aoife Bold, PA-C [facility] Office: [phone] Pager: [phone] 02/07/2014 8:55 AM

## 2014-02-07 NOTE — Care Management Note (Signed)
CARE MANAGEMENT NOTE 02/07/2014  Patient:  UTHMAN, MROCZKOWSKI   Account Number:  [id number]  Date Initiated:  01/31/2014  Documentation initiated by:  Jasmine Pang  Subjective/Objective Assessment:   CM following for progression and d/c planning.     Action/Plan:   01/31/2014 Met with pt wife re d/c needs. Pt will need RW and 3:1 per PT recommendation, orders placed. Await MD order for [facility] services.  02/04/14 Pt now considering short term rehab at a SNF, CSW aware.   Anticipated DC Date:  02/07/2014   Anticipated DC Plan:  SKILLED NURSING FACILITY         Choice offered to / List presented to:  C-3 Spouse   DME arranged  3-N-1  Vassie Moselle      DME agency  [place]        Status of service:  Completed, signed off Medicare Important Message given?  YES (If response is "NO", the following Medicare IM given date fields will be blank) Date Medicare IM given:  01/31/2014 Medicare IM given by:  Shabreka Coulon Date Additional Medicare IM given:  02/04/2014 Additional Medicare IM given by:  [facility]  Discharge Disposition:  [place]  Per UR Regulation:    If discussed at Long Length of Stay Meetings, dates discussed:    Comments:  02/07/2014 Plan to d/c today to short term SNf, IM given again and explained. [place] MPH, [place]

## 2014-02-07 NOTE — Progress Notes (Signed)
Patient ID: Jordan [name], male   DOB: Apr 06, 1940, 73 y.o.   MRN: [id number] Dressing removed. Incision clean and dry. No ischemic changes no cellulitis or drainage. Plan for dry dressing changes daily. Okay for discharge to skilled nursing. Touchdown weightbearing on the left.

## 2014-02-07 NOTE — Discharge Summary (Signed)
Jordan [name], 73 y.o., DOB 16-Aug-1940, MRN [id number]. Admission date: 01/24/2014 Discharge Date 02/07/2014 Primary MD [name], MD Admitting Physician Therisa Doyne, MD  Admission Diagnosis  OSTEOMYELITIS, GANGRENE, DIABETIC COMPLICATION claudication Left leg ischemia M62.89 Gangrene 5th Ray Amputation  Discharge Diagnosis   Principal Problem:   Diabetic foot ulcer Active Problems:   Diabetes   Hyperlipidemia   Essential hypertension   Coronary atherosclerosis   Cerebrovascular disease   Peripheral vascular disease   PAD (peripheral artery disease)   Diabetic osteomyelitis   Acute renal failure   CKD (chronic kidney disease), stage III   Leukocytosis   Fever presenting with conditions classified elsewhere   Diarrhea   Gangrene   Diabetic ulcer of left foot associated with diabetes mellitus due to underlying condition   Type 2 diabetes mellitus with diabetic chronic kidney disease      Past Medical History  Diagnosis Date  . CAD (coronary artery disease)     a. s/p IMI in past tx with POBA and cath 6 mos later with occluded RCA;  b. h/o Taxus DES to LAD and CFX;  c.  cath 1/10: LM ok, mLAD 40-50%, LAD stent ok, D2 tandem 60-70%, pCFX 30%, mAVCFX stent ok, pRCA 40%, RV 90%, RV marginal 90%, dRCA filled L-R collats tx medically  . Carotid stenosis     dopplers 5/11: 40-59% bilat  . DM2 (diabetes mellitus, type 2)   . HTN (hypertension)   . HLD (hyperlipidemia)   . Diabetes mellitus     Past Surgical History  Procedure Laterality Date  . Cardiac catheterization    . Femoral-tibial bypass graft Left 01/28/2014    Procedure:  FEMORAL-ANTERIOR TIBIAL ARTERY Bypass Graft utilizing composite gortex graft and vein graft;  Surgeon: [name], MD;  Location: [facility] OR;  Service: Vascular;  Laterality: Left;  . Amputation Left 01/28/2014    Procedure: AMPUTATION DIGIT- LEFT 5TH TOE;  Surgeon: [name], MD;  Location: [facility] OR;  Service: Vascular;   Laterality: Left;  . Amputation Left 02/02/2014    Procedure: Left 4th Ray Amputation vs Transmetatarsal Amputation;  Surgeon: [name], MD;  Location: [facility] OR;  Service: Orthopedics;  Laterality: Left;   Brief narrative: 73 y.o. male who presents with non healing wound to left foot.He was started on Keflex ~ 2 weeks ago.he developed worsening fever and chills and presented to outside hospital and was then transferred to [facility]. Started on Vanc/Zosyn. Consult ortho who rec vascular surgery consult, who recommended cardiology Dr. Kirke Corin who rec angiogram on 10.28.2015, agree with proceeding with bypass and amputation.  Hospital Course See H&P, Labs, Consult and Test reports for all details in brief, patient was admitted for **  Principal Problem:   Diabetic foot ulcer Active Problems:   Diabetes   Hyperlipidemia   Essential hypertension   Coronary atherosclerosis   Cerebrovascular disease   Peripheral vascular disease   PAD (peripheral artery disease)   Diabetic osteomyelitis   Acute renal failure   CKD (chronic kidney disease), stage III   Leukocytosis   Fever presenting with conditions classified elsewhere   Diarrhea   Gangrene   Diabetic ulcer of left foot associated with diabetes mellitus due to underlying condition   Type 2 diabetes mellitus with diabetic chronic kidney disease  Gangrene left foot/ sepsis - PVD:  Patient came with gangrene left foot, 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 Started initiallyon vancomycin and Zosyn, be transitioned to oral antibiotic doxycycline and levofloxacin for another  10 days after discharge. consulted cardiology eval- ABI < 0.5 B/L - Angiogram on 10.28.2015 showed Flush occlusion of the left SFA cardiology recommended surgical intervention - Amputation of the left fifth toe with Left common femoral artery to anterior tibial artery gortex bypass with composite PTFE left greater saphenous vein graft on 10.30.2015. ABI showed  improvement. revision surgery with transmetatarsal amputation on 11/4 by Dr. Lajoyce Corners.  -continue with wound care, rule out with Dr. Lajoyce Corners in 2 weeks regarding his wound, and Dr. Durwin Nora in 2 weeks regarding his bypass.  Depression Patient was diagnosed with depression by his PCP, did not take SSRI which was offered to him.started the patient on low-dose Remeron 7.5 mg by mouth daily at bedtime.  Diabetes Mellitus 2, insulin requiring  - Continue Lantus 25 units every morning, 20 units at bedtime, and insulin sliding scale on discharge - BG cont to improve. - A1c 8.9 BG poorly controlled. Will need aggressive treatment when ready to go home  Diarrhea  - due to antibiotics - c diff negative. - now resolved.  Essential hypertension  - Imdur,  Norvasc, Toprol , We'll hold losartan on discharge giving his CKD.  Coronary atherosclerosis  Cont ASA   Peripheral vascular disease  - cont ASA- vascular sx consulted. -   CKD (chronic kidney disease), stage III  - Cr increased stabilize. -losartan and metformin at discharge   Code Status: Full code       Consultants:  Ortho  Vascular  cardio  Procedures:  Angiogram by Dr. Kirke Corin 10.28.2015         left femoral to anterior tibial artery bypass with composite PTFE vein graft  By Dr. Durwin Nora 01/28/2014         Transmetatarsal amputation of left foot by Dr. Lajoyce Corners 11/4 Antibiotics:  vanc zosyn on admission switched to doxycycline and levofloxacin on discharge.   Significant Tests:  See full reports for all details    Mr Foot Left Wo Contrast  01/25/2014   CLINICAL DATA:  Initial encounter. Diabetic foot ulcer. Foot infection. Gas in the foot compatible with soft tissue infection. Evaluate osteomyelitis.  EXAM: MRI OF THE LEFT FOREFOOT WITHOUT CONTRAST  TECHNIQUE: Multiplanar, multisequence MR imaging was performed. No intravenous contrast was administered.  COMPARISON:  None.  FINDINGS: The study is moderately degraded by motion  artifact. There is ulceration over the lateral aspect of the fifth MTP joint. Gas is present in the soft tissues producing susceptibility artifact. Despite the proximity of the ulceration to the fifth metatarsal head, there are no findings of osteomyelitis. No cortical osteolysis, effacement of fatty marrow or bone marrow edema in the fifth metatarsal head or proximal phalanx of the small toe. The other metatarsals also appear normal. First MTP joint osteoarthritis noted. Diffuse edema of the forefoot compatible with cellulitis.  IMPRESSION: 1. Negative for osteomyelitis or abscess. 2. Forefoot edema compatible with cellulitis. 3. Ulceration over the lateral forefoot with gas in the soft tissues.   Electronically Signed   By: Andreas Newport M.D.   On: 01/25/2014 07:54   Dg Foot Complete Left  01/24/2014   CLINICAL DATA:  Diabetic foot ulcer  EXAM: LEFT FOOT - COMPLETE 3+ VIEW  COMPARISON:  None.  FINDINGS: Soft tissue swelling is noted as well as a considerable amount of air within the soft tissues surrounding the fifth MTP joint. This extends proximally along the plantar aspect of the foot similar to that seen on the prior exam. Mild degenerative changes are noted although no  aggressive lytic changes to suggest osteomyelitis is seen.  IMPRESSION: Considerable soft tissue gas infection as described. This is stable from the similar film 13 hr previous. Again MRI is recommended if there is clinical concern for osteomyelitis.   Electronically Signed   By: Alcide Clever M.D.   On: 01/24/2014 10:41     Today   Subjective:   Sinan Tuch today has no headache,no chest abdominal pain,no new weakness tingling or numbness, feels much better .  Objective:   Blood pressure 130/52, pulse 69, temperature 98.6 F (37 C), temperature source Oral, resp. rate 17, height 5\' 8"  (1.727 m), weight 104 kg (229 lb 4.5 oz), SpO2 100 %.  Intake/Output Summary (Last 24 hours) at 02/07/14 1255 Last data filed at 02/07/14  0940  Gross per 24 hour  Intake    720 ml  Output    450 ml  Net    270 ml    Exam Physical Exam: Head: Normocephalic, atraumatic.  Eyes: No signs of jaundice, EOMI Lungs: Normal respiratory effort. B/L Clear to auscultation, no crackles or wheezes.  Heart: Regular RR. S1 and S2 normal  Abdomen: BS normoactive. Soft, Nondistended, non-tender.  Extremities: Left foot in dressing  Data Review   Cultures -  Results for orders placed or performed during the hospital encounter of 01/24/14  Culture, blood (routine x 2)     Status: None   Collection Time: 01/24/14  5:35 AM  Result Value Ref Range Status   Specimen Description BLOOD LEFT ARM  Final   Special Requests BOTTLES DRAWN AEROBIC ONLY 5CC  Final   Culture  Setup Time   Final    01/24/2014 09:47 Performed at [facility]    Culture   Final    NO GROWTH 5 DAYS Performed at [facility]    Report Status 01/30/2014 FINAL  Final  Culture, blood (routine x 2)     Status: None   Collection Time: 01/24/14  5:45 AM  Result Value Ref Range Status   Specimen Description BLOOD LEFT ARM  Final   Special Requests BOTTLES DRAWN AEROBIC AND ANAEROBIC [facility] EACH  Final   Culture  Setup Time   Final    01/24/2014 09:47 Performed at [facility]    Culture   Final    NO GROWTH 5 DAYS Performed at [facility]    Report Status 01/30/2014 FINAL  Final  Clostridium Difficile by PCR     Status: None   Collection Time: 01/24/14  8:07 PM  Result Value Ref Range Status   C difficile by pcr NEGATIVE NEGATIVE Final  Stool culture     Status: None   Collection Time: 01/24/14  8:07 PM  Result Value Ref Range Status   Specimen Description STOOL  Final   Special Requests NONE  Final   Culture   Final    NO SALMONELLA, SHIGELLA, CAMPYLOBACTER, YERSINIA, OR E.COLI 0157:H7 ISOLATED Performed at [facility]   Report Status 01/28/2014 FINAL  Final  Surgical PCR screen     Status: None   Collection  Time: 01/27/14 10:56 PM  Result Value Ref Range Status   MRSA, PCR NEGATIVE NEGATIVE Final   Staphylococcus aureus NEGATIVE NEGATIVE Final    Comment:        The Xpert SA Assay (FDA approved for NASAL specimens in patients over 49 years of age), is one component of a comprehensive surveillance program.  Test performance has been validated by [facility]  for patients greater than or equal to 5 year old. It is not intended to diagnose infection nor to guide or monitor treatment.  Wound culture     Status: None   Collection Time: 01/28/14  3:29 PM  Result Value Ref Range Status   Specimen Description WOUND LEFT TOE  Final   Special Requests POF ZISTN AND VANCOMYCIN  Final   Gram Stain   Final    FEW WBC PRESENT,BOTH PMN AND MONONUCLEAR NO SQUAMOUS EPITHELIAL CELLS SEEN NO ORGANISMS SEEN Performed at [facility]    Culture   Final    FEW MICROAEROPHILIC STREPTOCOCCI Note: Standardized susceptibility testing for this organism is not available. Performed at [facility]    Report Status 01/30/2014 FINAL  Final  Anaerobic culture     Status: None   Collection Time: 01/28/14  3:29 PM  Result Value Ref Range Status   Specimen Description WOUND LEFT TOE  Final   Special Requests POF ZOSYN AND VANCOMYCIN  Final   Gram Stain   Final    FEW WBC PRESENT,BOTH PMN AND MONONUCLEAR NO SQUAMOUS EPITHELIAL CELLS SEEN NO ORGANISMS SEEN Performed at [facility]    Culture   Final    NO ANAEROBES ISOLATED Performed at [facility]    Report Status 02/02/2014 FINAL  Final  Surgical pcr screen     Status: None   Collection Time: 02/01/14  7:49 PM  Result Value Ref Range Status   MRSA, PCR NEGATIVE NEGATIVE Final   Staphylococcus aureus NEGATIVE NEGATIVE Final    Comment:        The Xpert SA Assay (FDA approved for NASAL specimens in patients over 55 years of age), is one component of a comprehensive surveillance program.  Test performance  has been validated by [facility] for patients greater than or equal to 31 year old. It is not intended to diagnose infection nor to guide or monitor treatment.      CBC w Diff:  Lab Results  Component Value Date   WBC 10.9* 02/07/2014   HGB 9.0* 02/07/2014   HCT 28.1* 02/07/2014   PLT 571* 02/07/2014   LYMPHOPCT 26.0 04/02/2013   MONOPCT 9.4 04/02/2013   EOSPCT 4.6 04/02/2013   BASOPCT 0.3 04/02/2013   CMP:  Lab Results  Component Value Date   NA 143 02/07/2014   K 3.5* 02/07/2014   CL 104 02/07/2014   CO2 26 02/07/2014   BUN 18 02/07/2014   CREATININE 1.77* 02/07/2014   CREATININE 1.27 12/19/2010   CREATININE 1.27 12/19/2010   PROT 8.0 01/24/2014   ALBUMIN 2.4* 01/24/2014   BILITOT 0.8 01/24/2014   ALKPHOS 96 01/24/2014   AST 33 01/24/2014   ALT 27 01/24/2014  .  Micro Results Recent Results (from the past 240 hour(s))  Wound culture     Status: None   Collection Time: 01/28/14  3:29 PM  Result Value Ref Range Status   Specimen Description WOUND LEFT TOE  Final   Special Requests POF ZISTN AND VANCOMYCIN  Final   Gram Stain   Final    FEW WBC PRESENT,BOTH PMN AND MONONUCLEAR NO SQUAMOUS EPITHELIAL CELLS SEEN NO ORGANISMS SEEN Performed at [facility]    Culture   Final    FEW MICROAEROPHILIC STREPTOCOCCI Note: Standardized susceptibility testing for this organism is not available. Performed at [facility]    Report Status 01/30/2014 FINAL  Final  Anaerobic culture     Status: None  Collection Time: 01/28/14  3:29 PM  Result Value Ref Range Status   Specimen Description WOUND LEFT TOE  Final   Special Requests POF ZOSYN AND VANCOMYCIN  Final   Gram Stain   Final    FEW WBC PRESENT,BOTH PMN AND MONONUCLEAR NO SQUAMOUS EPITHELIAL CELLS SEEN NO ORGANISMS SEEN [facility]formed at [facility]    Culture   Final    NO ANAEROBES ISOLATED Performed at [facility]    Report Status 02/02/2014 FINAL  Final  Surgical pcr  screen     Status: None   Collection Time: 02/01/14  7:49 PM  Result Value Ref Range Status   MRSA, PCR NEGATIVE NEGATIVE Final   Staphylococcus aureus NEGATIVE NEGATIVE Final    Comment:        The Xpert SA Assay (FDA approved for NASAL specimens in patients over 73 years of age), is one component of a comprehensive surveillance program.  Test performance has been validated by [facility] for patients greater than or equal to 73 year old. It is not intended to diagnose infection nor to guide or monitor treatment.      Discharge Instructions     Please follow wound care instructions,  And touchdown weightbearing on the left foot     Follow-up Information    Follow up with DICKSON,CHRISTOPHER S, MD In 2 weeks.   Specialty:  Vascular Surgery   Why:  Office will call you to arrange your appt    Contact information:   [address] [place] [address] [phone]       Follow up with DUDA,MARCUS V, MD. Schedule an appointment as soon as possible for a visit in 2 weeks.   Specialty:  Orthopedic Surgery   Contact information:   [address] [place] [address] [phone]       Discharge Medications     Medication List    STOP taking these medications        cephALEXin 500 MG capsule  Commonly known as:  KEFLEX     losartan 50 MG tablet  Commonly known as:  COZAAR     metformin 1000 MG (OSM) 24 hr tablet  Commonly known as:  FORTAMET     neomycin-bacitracin-polymyxin [phone] ointment     silver sulfADIAZINE 1 % cream  Commonly known as:  SILVADENE      TAKE these medications        acetaminophen 325 MG tablet  Commonly known as:  TYLENOL  Take 2 tablets (650 mg total) by mouth every 6 (six) hours as needed for mild pain (or Fever >/= 101).     albuterol (2.5 MG/3ML) 0.083% nebulizer solution  Commonly known as:  PROVENTIL  Take 3 mLs (2.5 mg total) by nebulization every 6 (six) hours as needed for wheezing.     amLODipine 10 MG  tablet  Commonly known as:  NORVASC  Take 10 mg by mouth daily.     aspirin 81 MG chewable tablet  Chew 81 mg by mouth daily.     doxycycline 100 MG tablet  Commonly known as:  VIBRA-TABS  Take 1 tablet (100 mg total) by mouth every 12 (twelve) hours.     DSS 100 MG Caps  Take 100 mg by mouth 2 (two) times daily.     feeding supplement (GLUCERNA SHAKE) Liqd  Take 237 mLs by mouth 2 (two) times daily between meals.     feeding supplement (PRO-STAT SUGAR FREE 64) Liqd  Take  30 mLs by mouth 2 (two) times daily between meals.     insulin aspart 100 UNIT/ML injection  Commonly known as:  novoLOG  Inject 0-20 Units into the skin 3 (three) times daily with meals.     insulin glargine 100 UNIT/ML injection  Commonly known as:  LANTUS  Inject 0.25 mLs (25 Units total) into the skin daily.     insulin glargine 100 UNIT/ML injection  Commonly known as:  LANTUS  Inject 0.2 mLs (20 Units total) into the skin at bedtime.     isosorbide mononitrate 30 MG 24 hr tablet  Commonly known as:  IMDUR  Take 30 mg by mouth daily.     levofloxacin 750 MG tablet  Commonly known as:  LEVAQUIN  Take 1 tablet (750 mg total) by mouth every other day.     metoprolol succinate 100 MG 24 hr tablet  Commonly known as:  TOPROL-XL  Take 100 mg by mouth 2 (two) times daily.     mirtazapine 7.5 MG tablet  Commonly known as:  REMERON  Take 1 tablet (7.5 mg total) by mouth at bedtime.     nitroGLYCERIN 0.4 MG SL tablet  Commonly known as:  NITROSTAT  Place 0.4 mg under the tongue every 5 (five) minutes as needed.     ondansetron 4 MG tablet  Commonly known as:  ZOFRAN  Take 1 tablet (4 mg total) by mouth every 6 (six) hours as needed for nausea.     oxyCODONE-acetaminophen 5-325 MG per tablet  Commonly known as:  PERCOCET/ROXICET  Take 1-2 tablets by mouth every 6 (six) hours as needed for moderate pain or severe pain.     pantoprazole 40 MG tablet  Commonly known as:  PROTONIX  Take 1 tablet  (40 mg total) by mouth daily.     rosuvastatin 40 MG tablet  Commonly known as:  CRESTOR  Take 40 mg by mouth every evening.         Total Time in preparing paper work, data evaluation and todays exam - 35 minutes  Jaemarie Hochberg M.D on 02/07/2014 at 12:55 PM  [facility] Office  [phone]

## 2014-02-07 NOTE — Progress Notes (Signed)
[facility].

## 2014-02-07 NOTE — Progress Notes (Signed)
Per MD- patient is medically stable for d/c today.  [facility]- facility is attempting to obtain [facility] authorization and has been working on this case since this past Friday (submitted request).  CSW has been in contact with Gregary SignsBeth Moran, Admissions Director of [facility] throughout the day and at this time- we do not have [place] auth. CSW has requested that facility accept a Letter of Guarantee if unable to obtain auth. CSW has spoken to patient's wife Talbert ForestShirley who is aware of above.  Nursing notified of above.  Lorri Frederickonna T. Jaci LazierCrowder, [place] [phone]

## 2014-02-07 NOTE — Progress Notes (Signed)
Inpatient Diabetes Program Recommendations  AACE/ADA: New Consensus Statement on Inpatient Glycemic Control (2013)  Target Ranges:  Prepandial:   less than 140 mg/dL      Peak postprandial:   less than 180 mg/dL (1-2 hours)      Critically ill patients:  140 - 180 mg/dL   Results for Jordan [name], Jordan [name] (MRN [id number]) as of 02/07/2014 11:07  Ref. Range 02/06/2014 08:27 02/06/2014 11:44 02/06/2014 17:21 02/06/2014 22:05 02/07/2014 07:50  Glucose-Capillary Latest Range: 70-99 mg/dL 88 119109 (H) 147229 (H) 829235 (H) 135 (H)   Current orders for Inpatient glycemic control: Lantus 20 units BID, Novolog 0-20 units AC, Novolog 0-5 units HS  Inpatient Diabetes Program Recommendations Insulin - Basal: Please consider increasing morning dose of Lantus to 25 units QAM and leave bedtime dose at Lantus 20 units QHS.  Thanks, Orlando PennerMarie Annalynne Ibanez, RN, MSN, CCRN, CDE Diabetes Coordinator Inpatient Diabetes Program [phone] (Team Pager) [phone] ([facility] office) [phone] [facility] office)

## 2014-02-07 NOTE — Progress Notes (Signed)
Physical Therapy Treatment Patient Details Name: Jordan [name] MRN: [id number] DOB: March 02, 1941 Today's Date: 02/07/2014    History of Present Illness Jordan Cheekshomas Well is a 73 y.o. 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 PMH of DM, HTN, coronary atherosclerosis, CVD, PVD, PAD, diabetic osteomyelitis, CKD stage III, leukocytosis, and gangrene. Pt now s/p L mid-foot amputation 02/03/14.     PT Comments    Pt progressing towards physical therapy goals. Pt appeared to be in a better place emotionally and states he and wife have decided on rehab at d/c. Further discussed with pt benefits of rehab, and pt states he is happy with his choice. Pt continues to improve with mobility, however VC's necessary for TDWB status and general sequencing and safety awareness with OOB. Will continue to follow.    Follow Up Recommendations  Supervision/Assistance - 24 hour;SNF     Equipment Recommendations  Rolling walker with 5" wheels    Recommendations for Other Services       Precautions / Restrictions Precautions Precautions: Fall Restrictions Weight Bearing Restrictions: Yes LLE Weight Bearing: Touchdown weight bearing    Mobility  Bed Mobility Overal bed mobility: Needs Assistance Bed Mobility: Supine to Sit     Supine to sit: Min guard     General bed mobility comments: No physical assist required. Increased time and heavy use of bed rails for transition to EOB.   Transfers Overall transfer level: Needs assistance Equipment used: Rolling walker (2 wheeled) Transfers: Sit to/from Stand Sit to Stand: Min assist Stand pivot transfers: Min guard       General transfer comment: Pt able to transition to full standing position without assistance. VC's for positioning of LLE out in front during transfer to minimize weight put through the foot.    Ambulation/Gait Ambulation/Gait assistance: Min guard Ambulation Distance (Feet): 5 Feet Assistive device: Rolling walker (2 wheeled) Gait Pattern/deviations: Step-to pattern;Decreased stride length;Trunk flexed Gait velocity: Decreased Gait velocity interpretation: Below normal speed for age/gender General Gait Details: Pt able to take a few steps from bed to chair. VC's for TDWB status.   Stairs            Wheelchair Mobility    Modified Rankin (Stroke Patients Only)       Balance Overall balance assessment: Needs assistance Sitting-balance support: No upper extremity supported;Feet supported Sitting balance-Leahy Scale: Fair     Standing balance support: Bilateral upper extremity supported;During functional activity Standing balance-Leahy Scale: Poor Standing balance comment: Pt requires UE support during standing activity to maintain balance and TDWB status on LLE.                     Cognition Arousal/Alertness: Awake/alert Behavior During Therapy: WFL for tasks assessed/performed Overall Cognitive Status: Within Functional Limits for tasks assessed                      Exercises General Exercises - Lower Extremity Ankle Circles/Pumps: 15 reps Quad Sets: 15 reps Hip ABduction/ADduction: 15 reps Straight Leg Raises: 10 reps    General Comments        Pertinent Vitals/Pain Pain Assessment: No/denies pain    Home Living Family/patient expects to be discharged to:: Private residence                    Prior Function  PT Goals (current goals can now be found in the care plan section) Acute Rehab PT Goals Patient Stated Goal: Return home - get back to previous level of activity PT Goal Formulation: With patient Time For Goal Achievement: 02/14/14 Potential to Achieve Goals: Good Progress towards PT goals: Progressing toward goals    Frequency  Min 3X/week    PT Plan Current plan remains appropriate     Co-evaluation             End of Session Equipment Utilized During Treatment: Gait belt Activity Tolerance: Patient limited by fatigue Patient left: in chair;with call bell/phone within reach;with family/visitor present     Time: [id number] PT Time Calculation (min): 33 min  Charges:  $Gait Training: 8-22 mins $Therapeutic Exercise: 8-22 mins                    G Codes:      Conni SlipperKirkman, Thurley Francesconi 02/07/2014, 2:21 PM   Conni SlipperLaura Terresa Marlett, PT, DPT Acute Rehabilitation Services Pager: [phone]

## 2014-02-07 NOTE — Progress Notes (Signed)
Patient Discharge:   Disposition: Patient discharged to [facility]   [facility]   IV: removed   Prescriptions: Sent with patient   Transportation: [facility]  Belongings: all belongings taken with patient's wife

## 2014-02-07 NOTE — Discharge Instructions (Signed)
Follow with Primary MD Lindwood QuaHOFFMAN,BYRON, MD  Or skilled nursing facility physician in 3 days   Get CBC, CMP, 2 view Chest X ray checked  by Primary MD next visit or skull nursing facility physician.   Activity: as per PT/OT skilled nursing facility evaluation and recommendation,with Touchdown weightbearing on the left.   Disposition SNF    Diet: Heart Healthy , carbohydrate modified, with feeding assistance and aspiration precautions as needed.  For Heart failure patients - Check your Weight same time everyday, if you gain over 2 pounds, or you develop in leg swelling, experience more shortness of breath or chest pain, call your Primary MD immediately. Follow Cardiac Low Salt Diet and 1.8 lit/day fluid restriction.   On your next visit with your primary care physician please Get Medicines reviewed and adjusted.   Please request your Prim.MD to go over all Hospital Tests and Procedure/Radiological results at the follow up, please get all Hospital records sent to your Prim MD by signing hospital release before you go home.   If you experience worsening of your admission symptoms, develop shortness of breath, life threatening emergency, suicidal or homicidal thoughts you must seek medical attention immediately by calling 911 or calling your MD immediately  if symptoms less severe.  You Must read complete instructions/literature along with all the possible adverse reactions/side effects for all the Medicines you take and that have been prescribed to you. Take any new Medicines after you have completely understood and accpet all the possible adverse reactions/side effects.   Do not drive, operating heavy machinery, perform activities at heights, swimming or participation in water activities or provide baby sitting services if your were admitted for syncope or siezures until you have seen by Primary MD or a Neurologist and advised to do so again.  Do not drive when taking Pain medications.     Do not take more than prescribed Pain, Sleep and Anxiety Medications  Special Instructions: If you have smoked or chewed Tobacco  in the last 2 yrs please stop smoking, stop any regular Alcohol  and or any Recreational drug use.  Wear Seat belts while driving.   Please note  You were cared for by a hospitalist during your hospital stay. If you have any questions about your discharge medications or the care you received while you were in the hospital after you are discharged, you can call the unit and asked to speak with the hospitalist on call if the hospitalist that took care of you is not available. Once you are discharged, your primary care physician will handle any further medical issues. Please note that NO REFILLS for any discharge medications will be authorized once you are discharged, as it is imperative that you return to your primary care physician (or establish a relationship with a primary care physician if you do not have one) for your aftercare needs so that they can reassess your need for medications and monitor your lab values.

## 2014-02-16 ENCOUNTER — Encounter: Payer: Self-pay | Admitting: Vascular Surgery

## 2014-02-17 ENCOUNTER — Ambulatory Visit (INDEPENDENT_AMBULATORY_CARE_PROVIDER_SITE_OTHER): Payer: Self-pay | Admitting: Vascular Surgery

## 2014-02-17 ENCOUNTER — Encounter: Payer: Self-pay | Admitting: Vascular Surgery

## 2014-02-17 VITALS — BP 136/65 | HR 68 | Resp 18 | Ht 69.0 in | Wt 209.0 lb

## 2014-02-17 DIAGNOSIS — I70269 Atherosclerosis of native arteries of extremities with gangrene, unspecified extremity: Secondary | ICD-10-CM | POA: Insufficient documentation

## 2014-02-17 NOTE — Progress Notes (Signed)
   Patient name: Jordan [name] MRN: [id number] DOB: 1940/11/07 Sex: male  REASON FOR VISIT: Follow up after left lower extremity bypass  HPI: Jordan [name] is a 73 y.o. 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 He had extensive infrainguinal arterial occlusive disease and was not a candidate for an endovascular approach. He underwent left common femoral artery to anterior tibial artery bypass with a composite PTFE left greater saphenous vein graft on 01/28/2014. 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 Subsequently the foot wound was managed by Dr. Aldean BakerMarcus Duda. He comes in for his first outpatient visit. He has no specific complaints and overall been doing quite well. He was just seen by Dr. Lajoyce Cornersuda this morning he was pleased with his left foot wound.   REVIEW OF SYSTEMS: Arly.Keller[X ] denotes positive finding; [  ] denotes negative finding  CARDIOVASCULAR:  [ ]  chest pain   [ ]  dyspnea on exertion    CONSTITUTIONAL:  [ ]  fever   [ ]  chills  PHYSICAL EXAM: Filed Vitals:   02/17/14 1654  BP: 136/65  Pulse: 68  Resp: 18  Height: 5\' 9"  (1.753 m)  Weight: 209 lb (94.802 kg)   Body mass index is 30.85 kg/(m^2). GENERAL: The patient is a well-nourished male, in no acute distress. The vital signs are documented above. CARDIOVASCULAR: There is a regular rate and rhythm. PULMONARY: There is good air exchange bilaterally without wheezing or rales. He has a palpable graft pulse in the lateral part of his left leg. All of his incisions are healing nicely.  MEDICAL ISSUES: The patient is doing well status post left femoral to anterior tibial artery bypass with a composite graft. We'll follow his graft on our protocol. I'll plan on seeing him back in 3 months. He knows to call sooner if he has problems. In the meantime Dr. Lajoyce Cornersuda is following his foot.  Tarez Bowns S [facility] Beeper: [phone]

## 2014-03-10 ENCOUNTER — Encounter (HOSPITAL_COMMUNITY): Payer: Self-pay | Admitting: Cardiovascular Disease

## 2014-03-20 ENCOUNTER — Inpatient Hospital Stay (HOSPITAL_COMMUNITY): Payer: Medicare PPO

## 2014-03-20 ENCOUNTER — Emergency Department (HOSPITAL_COMMUNITY): Payer: Medicare PPO

## 2014-03-20 ENCOUNTER — Inpatient Hospital Stay (HOSPITAL_COMMUNITY)
Admission: EM | Admit: 2014-03-20 | Discharge: 2014-03-30 | DRG: 856 | Disposition: A | Discharge status: Skilled Nursing Facility | Payer: Medicare PPO | Attending: Family Medicine | Admitting: Family Medicine

## 2014-03-20 ENCOUNTER — Encounter (HOSPITAL_COMMUNITY): Payer: Self-pay | Admitting: *Deleted

## 2014-03-20 DIAGNOSIS — Z89422 Acquired absence of other left toe(s): Secondary | ICD-10-CM

## 2014-03-20 DIAGNOSIS — L97509 Non-pressure chronic ulcer of other part of unspecified foot with unspecified severity: Secondary | ICD-10-CM

## 2014-03-20 DIAGNOSIS — T8781 Dehiscence of amputation stump: Secondary | ICD-10-CM | POA: Diagnosis present

## 2014-03-20 DIAGNOSIS — Z794 Long term (current) use of insulin: Secondary | ICD-10-CM | POA: Diagnosis not present

## 2014-03-20 DIAGNOSIS — M25511 Pain in right shoulder: Secondary | ICD-10-CM | POA: Diagnosis present

## 2014-03-20 DIAGNOSIS — E11621 Type 2 diabetes mellitus with foot ulcer: Secondary | ICD-10-CM | POA: Diagnosis present

## 2014-03-20 DIAGNOSIS — I5032 Chronic diastolic (congestive) heart failure: Secondary | ICD-10-CM | POA: Diagnosis present

## 2014-03-20 DIAGNOSIS — L97529 Non-pressure chronic ulcer of other part of left foot with unspecified severity: Secondary | ICD-10-CM | POA: Diagnosis present

## 2014-03-20 DIAGNOSIS — T8744 Infection of amputation stump, left lower extremity: Secondary | ICD-10-CM | POA: Diagnosis present

## 2014-03-20 DIAGNOSIS — E1165 Type 2 diabetes mellitus with hyperglycemia: Secondary | ICD-10-CM | POA: Diagnosis present

## 2014-03-20 DIAGNOSIS — Z87891 Personal history of nicotine dependence: Secondary | ICD-10-CM | POA: Diagnosis not present

## 2014-03-20 DIAGNOSIS — Z8673 Personal history of transient ischemic attack (TIA), and cerebral infarction without residual deficits: Secondary | ICD-10-CM

## 2014-03-20 DIAGNOSIS — I679 Cerebrovascular disease, unspecified: Secondary | ICD-10-CM | POA: Diagnosis present

## 2014-03-20 DIAGNOSIS — T798XXA Other early complications of trauma, initial encounter: Secondary | ICD-10-CM

## 2014-03-20 DIAGNOSIS — T814XXA Infection following a procedure, initial encounter: Principal | ICD-10-CM | POA: Diagnosis present

## 2014-03-20 DIAGNOSIS — A4101 Sepsis due to Methicillin susceptible Staphylococcus aureus: Secondary | ICD-10-CM | POA: Diagnosis present

## 2014-03-20 DIAGNOSIS — Z1611 Resistance to penicillins: Secondary | ICD-10-CM | POA: Diagnosis present

## 2014-03-20 DIAGNOSIS — I350 Nonrheumatic aortic (valve) stenosis: Secondary | ICD-10-CM | POA: Diagnosis present

## 2014-03-20 DIAGNOSIS — I2510 Atherosclerotic heart disease of native coronary artery without angina pectoris: Secondary | ICD-10-CM

## 2014-03-20 DIAGNOSIS — Z1629 Resistance to other single specified antibiotic: Secondary | ICD-10-CM | POA: Diagnosis present

## 2014-03-20 DIAGNOSIS — M869 Osteomyelitis, unspecified: Secondary | ICD-10-CM | POA: Diagnosis present

## 2014-03-20 DIAGNOSIS — I6529 Occlusion and stenosis of unspecified carotid artery: Secondary | ICD-10-CM | POA: Diagnosis present

## 2014-03-20 DIAGNOSIS — Z888 Allergy status to other drugs, medicaments and biological substances status: Secondary | ICD-10-CM | POA: Diagnosis not present

## 2014-03-20 DIAGNOSIS — I739 Peripheral vascular disease, unspecified: Secondary | ICD-10-CM | POA: Diagnosis present

## 2014-03-20 DIAGNOSIS — R509 Fever, unspecified: Secondary | ICD-10-CM

## 2014-03-20 DIAGNOSIS — E1151 Type 2 diabetes mellitus with diabetic peripheral angiopathy without gangrene: Secondary | ICD-10-CM | POA: Diagnosis present

## 2014-03-20 DIAGNOSIS — L02612 Cutaneous abscess of left foot: Secondary | ICD-10-CM | POA: Diagnosis present

## 2014-03-20 DIAGNOSIS — Z833 Family history of diabetes mellitus: Secondary | ICD-10-CM | POA: Diagnosis not present

## 2014-03-20 DIAGNOSIS — T148XXA Other injury of unspecified body region, initial encounter: Secondary | ICD-10-CM

## 2014-03-20 DIAGNOSIS — Z7982 Long term (current) use of aspirin: Secondary | ICD-10-CM | POA: Diagnosis not present

## 2014-03-20 DIAGNOSIS — R7881 Bacteremia: Secondary | ICD-10-CM | POA: Diagnosis not present

## 2014-03-20 DIAGNOSIS — E43 Unspecified severe protein-calorie malnutrition: Secondary | ICD-10-CM | POA: Diagnosis present

## 2014-03-20 DIAGNOSIS — Y835 Amputation of limb(s) as the cause of abnormal reaction of the patient, or of later complication, without mention of misadventure at the time of the procedure: Secondary | ICD-10-CM | POA: Diagnosis present

## 2014-03-20 DIAGNOSIS — I10 Essential (primary) hypertension: Secondary | ICD-10-CM | POA: Diagnosis present

## 2014-03-20 DIAGNOSIS — L089 Local infection of the skin and subcutaneous tissue, unspecified: Secondary | ICD-10-CM | POA: Diagnosis present

## 2014-03-20 DIAGNOSIS — Z95820 Peripheral vascular angioplasty status with implants and grafts: Secondary | ICD-10-CM | POA: Diagnosis not present

## 2014-03-20 DIAGNOSIS — E785 Hyperlipidemia, unspecified: Secondary | ICD-10-CM | POA: Diagnosis present

## 2014-03-20 DIAGNOSIS — E119 Type 2 diabetes mellitus without complications: Secondary | ICD-10-CM

## 2014-03-20 DIAGNOSIS — E08621 Diabetes mellitus due to underlying condition with foot ulcer: Secondary | ICD-10-CM

## 2014-03-20 DIAGNOSIS — IMO0002 Reserved for concepts with insufficient information to code with codable children: Secondary | ICD-10-CM | POA: Diagnosis present

## 2014-03-20 DIAGNOSIS — R6510 Systemic inflammatory response syndrome (SIRS) of non-infectious origin without acute organ dysfunction: Secondary | ICD-10-CM | POA: Diagnosis present

## 2014-03-20 LAB — CBC WITH DIFFERENTIAL/PLATELET
Basophils Absolute: 0 10*3/uL (ref 0.0–0.1)
Basophils Relative: 0 % (ref 0–1)
Eosinophils Absolute: 0.1 10*3/uL (ref 0.0–0.7)
Eosinophils Relative: 1 % (ref 0–5)
HCT: 29.9 % — ABNORMAL LOW (ref 39.0–52.0)
Hemoglobin: 9.4 g/dL — ABNORMAL LOW (ref 13.0–17.0)
Lymphocytes Relative: 17 % (ref 12–46)
Lymphs Abs: 2 10*3/uL (ref 0.7–4.0)
MCH: 25.3 pg — ABNORMAL LOW (ref 26.0–34.0)
MCHC: 31.4 g/dL (ref 30.0–36.0)
MCV: 80.4 fL (ref 78.0–100.0)
Monocytes Absolute: 0.9 10*3/uL (ref 0.1–1.0)
Monocytes Relative: 8 % (ref 3–12)
Neutro Abs: 8.5 10*3/uL — ABNORMAL HIGH (ref 1.7–7.7)
Neutrophils Relative %: 74 % (ref 43–77)
Platelets: 299 10*3/uL (ref 150–400)
RBC: 3.72 MIL/uL — ABNORMAL LOW (ref 4.22–5.81)
RDW: 16.4 % — ABNORMAL HIGH (ref 11.5–15.5)
WBC: 11.5 10*3/uL — ABNORMAL HIGH (ref 4.0–10.5)

## 2014-03-20 LAB — BASIC METABOLIC PANEL
Anion gap: 14 (ref 5–15)
BUN: 11 mg/dL (ref 6–23)
CO2: 26 mEq/L (ref 19–32)
Calcium: 9.1 mg/dL (ref 8.4–10.5)
Chloride: 91 mEq/L — ABNORMAL LOW (ref 96–112)
Creatinine, Ser: 1.1 mg/dL (ref 0.50–1.35)
GFR calc Af Amer: 75 mL/min — ABNORMAL LOW (ref >90)
GFR calc non Af Amer: 65 mL/min — ABNORMAL LOW (ref >90)
Glucose, Bld: 401 mg/dL — ABNORMAL HIGH (ref 70–99)
Potassium: 4.6 mEq/L (ref 3.7–5.3)
Sodium: 131 mEq/L — ABNORMAL LOW (ref 137–147)

## 2014-03-20 LAB — URINALYSIS, ROUTINE W REFLEX MICROSCOPIC
Bilirubin Urine: NEGATIVE
Glucose, UA: >1000 mg/dL — AB
Ketones, ur: NEGATIVE mg/dL
Leukocytes, UA: NEGATIVE
Nitrite: NEGATIVE
Protein, ur: 100 mg/dL — AB
Specific Gravity, Urine: 1.019 (ref 1.005–1.030)
Urobilinogen, UA: 0.2 mg/dL (ref 0.0–1.0)
pH: 7 (ref 5.0–8.0)

## 2014-03-20 LAB — I-STAT CG4 LACTIC ACID, ED: Lactic Acid, Venous: 2.36 mmol/L — ABNORMAL HIGH (ref 0.5–2.2)

## 2014-03-20 LAB — GLUCOSE, CAPILLARY
Glucose-Capillary: 377 mg/dL — ABNORMAL HIGH (ref 70–99)
Glucose-Capillary: 287 mg/dL — ABNORMAL HIGH (ref 70–99)

## 2014-03-20 LAB — TROPONIN I: Troponin I: <0.3 ng/mL (ref <0.30)

## 2014-03-20 LAB — URINE MICROSCOPIC-ADD ON

## 2014-03-20 LAB — SEDIMENTATION RATE: Sed Rate: 135 mm/hr — ABNORMAL HIGH (ref 0–16)

## 2014-03-20 LAB — ALBUMIN: Albumin: 2.4 g/dL — ABNORMAL LOW (ref 3.5–5.2)

## 2014-03-20 MED ORDER — ONDANSETRON HCL 4 MG/2ML IJ SOLN: 4.0000 mg | Freq: Four times a day (QID) | INTRAMUSCULAR | Status: DC | PRN

## 2014-03-20 MED ORDER — AMLODIPINE BESYLATE 10 MG PO TABS
10.0000 mg | ORAL_TABLET | Freq: Every day | ORAL | Status: DC
  Administered 2014-03-21 – 2014-03-30 (×9): 10 mg via ORAL
  Filled 2014-03-20 (×10): qty 1

## 2014-03-20 MED ORDER — NITROGLYCERIN 0.4 MG SL SUBL
0.4000 mg | SUBLINGUAL_TABLET | SUBLINGUAL | Status: DC | PRN
  Administered 2014-03-20: 0.4 mg via SUBLINGUAL

## 2014-03-20 MED ORDER — NITROGLYCERIN 0.4 MG SL SUBL
SUBLINGUAL_TABLET | SUBLINGUAL | Status: AC
  Filled 2014-03-20: qty 1

## 2014-03-20 MED ORDER — ACETAMINOPHEN 325 MG PO TABS
650.0000 mg | ORAL_TABLET | Freq: Once | ORAL | Status: AC
  Administered 2014-03-20: 650 mg via ORAL
  Filled 2014-03-20: qty 2

## 2014-03-20 MED ORDER — GLUCERNA SHAKE PO LIQD
237.0000 mL | Freq: Two times a day (BID) | ORAL | Status: DC
  Administered 2014-03-22 – 2014-03-26 (×4): 237 mL via ORAL

## 2014-03-20 MED ORDER — OXYCODONE HCL 5 MG/5ML PO SOLN: 5.0000 mg | Freq: Once | ORAL | Status: AC | PRN

## 2014-03-20 MED ORDER — ASPIRIN 81 MG PO CHEW
81.0000 mg | CHEWABLE_TABLET | Freq: Every day | ORAL | Status: DC
Start: 2014-03-21 — End: 2014-03-30
  Administered 2014-03-21 – 2014-03-30 (×9): 81 mg via ORAL
  Filled 2014-03-20 (×10): qty 1

## 2014-03-20 MED ORDER — HEPARIN SODIUM (PORCINE) 5000 UNIT/ML IJ SOLN
5000.0000 [IU] | Freq: Three times a day (TID) | INTRAMUSCULAR | Status: DC
  Administered 2014-03-20 – 2014-03-23 (×8): 5000 [IU] via SUBCUTANEOUS
  Filled 2014-03-20 (×13): qty 1

## 2014-03-20 MED ORDER — SODIUM CHLORIDE 0.9 % IV SOLN
Freq: Once | INTRAVENOUS | Status: AC
  Administered 2014-03-20: 14:00:00 via INTRAVENOUS

## 2014-03-20 MED ORDER — SODIUM CHLORIDE 0.9 % IV SOLN
1250.0000 mg | Freq: Two times a day (BID) | INTRAVENOUS | Status: DC
  Administered 2014-03-20 – 2014-03-22 (×4): 1250 mg via INTRAVENOUS
  Filled 2014-03-20 (×7): qty 1250

## 2014-03-20 MED ORDER — ONDANSETRON HCL 4 MG PO TABS: 4.0000 mg | ORAL_TABLET | Freq: Four times a day (QID) | ORAL | Status: DC | PRN

## 2014-03-20 MED ORDER — INSULIN ASPART 100 UNIT/ML ~~LOC~~ SOLN
0.0000 [IU] | Freq: Three times a day (TID) | SUBCUTANEOUS | Status: DC
  Administered 2014-03-20: 9 [IU] via SUBCUTANEOUS
  Administered 2014-03-21 (×2): 7 [IU] via SUBCUTANEOUS
  Administered 2014-03-21: 9 [IU] via SUBCUTANEOUS

## 2014-03-20 MED ORDER — DEXTROSE 5 % IV SOLN
2.0000 g | Freq: Once | INTRAVENOUS | Status: AC
  Administered 2014-03-20: 2 g via INTRAVENOUS
  Filled 2014-03-20: qty 2

## 2014-03-20 MED ORDER — INSULIN GLARGINE 100 UNIT/ML ~~LOC~~ SOLN
20.0000 [IU] | Freq: Every day | SUBCUTANEOUS | Status: DC
  Administered 2014-03-20 – 2014-03-21 (×2): 20 [IU] via SUBCUTANEOUS
  Filled 2014-03-20 (×3): qty 0.2

## 2014-03-20 MED ORDER — LOSARTAN POTASSIUM 50 MG PO TABS
100.0000 mg | ORAL_TABLET | Freq: Every day | ORAL | Status: DC
  Administered 2014-03-21 – 2014-03-30 (×9): 100 mg via ORAL
  Filled 2014-03-20 (×10): qty 2

## 2014-03-20 MED ORDER — SODIUM CHLORIDE 0.9 % IV SOLN
INTRAVENOUS | Status: AC
  Administered 2014-03-20: 50 mL/h via INTRAVENOUS
  Administered 2014-03-21: 1 mL via INTRAVENOUS

## 2014-03-20 MED ORDER — SILVER SULFADIAZINE 1 % EX CREA
1.0000 "application " | TOPICAL_CREAM | Freq: Every day | CUTANEOUS | Status: DC
  Administered 2014-03-21 – 2014-03-24 (×4): 1 via TOPICAL
  Filled 2014-03-20: qty 85

## 2014-03-20 MED ORDER — CEFTRIAXONE SODIUM IN DEXTROSE 40 MG/ML IV SOLN
2.0000 g | INTRAVENOUS | Status: DC
  Administered 2014-03-20: 2 g via INTRAVENOUS
  Filled 2014-03-20 (×2): qty 50

## 2014-03-20 MED ORDER — DOCUSATE SODIUM 100 MG PO CAPS
100.0000 mg | ORAL_CAPSULE | Freq: Two times a day (BID) | ORAL | Status: DC
  Administered 2014-03-20 – 2014-03-30 (×18): 100 mg via ORAL
  Filled 2014-03-20 (×17): qty 1

## 2014-03-20 MED ORDER — METRONIDAZOLE 500 MG PO TABS
500.0000 mg | ORAL_TABLET | Freq: Three times a day (TID) | ORAL | Status: DC
  Administered 2014-03-20 – 2014-03-21 (×3): 500 mg via ORAL
  Filled 2014-03-20 (×5): qty 1

## 2014-03-20 MED ORDER — ROSUVASTATIN CALCIUM 40 MG PO TABS
40.0000 mg | ORAL_TABLET | Freq: Every evening | ORAL | Status: DC
  Administered 2014-03-20 – 2014-03-29 (×9): 40 mg via ORAL
  Filled 2014-03-20 (×12): qty 1

## 2014-03-20 MED ORDER — ALUM & MAG HYDROXIDE-SIMETH 200-200-20 MG/5ML PO SUSP
30.0000 mL | Freq: Four times a day (QID) | ORAL | Status: DC | PRN
  Administered 2014-03-20: 30 mL via ORAL
  Filled 2014-03-20: qty 30

## 2014-03-20 MED ORDER — ALBUTEROL SULFATE (2.5 MG/3ML) 0.083% IN NEBU: 2.5000 mg | INHALATION_SOLUTION | Freq: Four times a day (QID) | RESPIRATORY_TRACT | Status: DC | PRN

## 2014-03-20 MED ORDER — METOPROLOL SUCCINATE ER 100 MG PO TB24
100.0000 mg | ORAL_TABLET | Freq: Two times a day (BID) | ORAL | Status: DC
  Administered 2014-03-20 – 2014-03-30 (×19): 100 mg via ORAL
  Filled 2014-03-20 (×25): qty 1

## 2014-03-20 MED ORDER — ISOSORBIDE MONONITRATE ER 30 MG PO TB24
30.0000 mg | ORAL_TABLET | Freq: Every day | ORAL | Status: DC
  Administered 2014-03-22 – 2014-03-30 (×9): 30 mg via ORAL
  Filled 2014-03-20 (×10): qty 1

## 2014-03-20 MED ORDER — HYDROMORPHONE HCL 1 MG/ML IJ SOLN: 0.2500 mg | INTRAMUSCULAR | Status: DC | PRN

## 2014-03-20 MED ORDER — OXYCODONE-ACETAMINOPHEN 5-325 MG PO TABS
1.0000 | ORAL_TABLET | Freq: Four times a day (QID) | ORAL | Status: DC | PRN
  Administered 2014-03-20 – 2014-03-21 (×2): 2 via ORAL
  Administered 2014-03-22: 1 via ORAL
  Administered 2014-03-23 (×2): 2 via ORAL
  Administered 2014-03-24 – 2014-03-25 (×2): 1 via ORAL
  Filled 2014-03-20 (×2): qty 2
  Filled 2014-03-20: qty 1
  Filled 2014-03-20: qty 2
  Filled 2014-03-20: qty 1
  Filled 2014-03-20 (×3): qty 2
  Filled 2014-03-20: qty 1

## 2014-03-20 MED ORDER — BISACODYL 10 MG RE SUPP
10.0000 mg | Freq: Every day | RECTAL | Status: DC | PRN
  Administered 2014-03-28: 10 mg via RECTAL
  Filled 2014-03-20: qty 1

## 2014-03-20 MED ORDER — OXYCODONE HCL 5 MG PO TABS: 5.0000 mg | ORAL_TABLET | Freq: Once | ORAL | Status: AC | PRN

## 2014-03-20 MED ORDER — POLYETHYLENE GLYCOL 3350 17 G PO PACK
17.0000 g | PACK | Freq: Every day | ORAL | Status: DC | PRN
Start: 2014-03-20 — End: 2014-03-30

## 2014-03-20 MED ORDER — METRONIDAZOLE IN NACL 5-0.79 MG/ML-% IV SOLN
500.0000 mg | Freq: Once | INTRAVENOUS | Status: AC
  Administered 2014-03-20: 500 mg via INTRAVENOUS
  Filled 2014-03-20: qty 100

## 2014-03-20 NOTE — ED Notes (Signed)
Patient returned from X-ray 

## 2014-03-20 NOTE — ED Notes (Signed)
Pt had left forefoot amputation on 10/30 and had to have a second surgery on early November to take additional tissue.  Pt has been home but has not had any specialized woundcare.  Pt has been having fever and chills for the past couple of days.  Pt reports CP since last night and right shoulder pain and sob. Clean dressing in place on left foot

## 2014-03-20 NOTE — H&P (Signed)
Patient Demographics  Jordan [name], is a 73 y.o. male  MRN: [id number]   DOB - [birth year]  Admit Date - 03/20/2014  Outpatient Primary MD for the patient is [name], MD   With History of -  Past Medical History  Diagnosis Date  . CAD (coronary artery disease)     a. s/p IMI in past tx with POBA and cath 6 mos later with occluded RCA;  b. h/o Taxus DES to LAD and CFX;  c.  cath 1/10: LM ok, mLAD 40-50%, LAD stent ok, D2 tandem 60-70%, pCFX 30%, mAVCFX stent ok, pRCA 40%, RV 90%, RV marginal 90%, dRCA filled L-R collats tx medically  . Carotid stenosis     dopplers 5/11: 40-59% bilat  . DM2 (diabetes mellitus, type 2)   . HTN (hypertension)   . HLD (hyperlipidemia)   . Diabetes mellitus       Past Surgical History  Procedure Laterality Date  . Cardiac catheterization    . Femoral-tibial bypass graft Left 01/28/2014    Procedure:  FEMORAL-ANTERIOR TIBIAL ARTERY Bypass Graft utilizing composite gortex graft and vein graft;  Surgeon: [name], MD;  Location: [facility] OR;  Service: Vascular;  Laterality: Left;  . Amputation Left 01/28/2014    Procedure: AMPUTATION DIGIT- LEFT 5TH TOE;  Surgeon: [name], MD;  Location: [facility] OR;  Service: Vascular;  Laterality: Left;  . Amputation Left 02/02/2014    Procedure: Left 4th Ray Amputation vs Transmetatarsal Amputation;  Surgeon: [name], MD;  Location: [facility] OR;  Service: Orthopedics;  Laterality: Left;  . Abdominal aortagram N/A 04/07/2013    Procedure: ABDOMINAL [name];  Surgeon: [name], MD;  Location: [facility] CATH LAB;  Service: Cardiovascular;  Laterality: N/A;  . Lower extremity angiogram Left 01/26/2014    Procedure: LOWER EXTREMITY ANGIOGRAM;  Surgeon: [name], MD;  Location: [facility] CATH LAB;  Service: Cardiovascular;  Laterality: Left;  . Abdominal aortagram N/A 01/26/2014    Procedure: ABDOMINAL AORTAGRAM;  Surgeon: [name], MD;  Location: [facility] CATH LAB;  Service: Cardiovascular;   Laterality: N/A;    in for   Chief Complaint  Patient presents with  . Post-op Problem     HPI  Jordan [name]  is a 73 y.o. male, with known history of diabetes mellitus, coronary artery disease, peripheral vascular disease, CVA, hyperlipidemia, with recent hospitalization in early November for diabetic foot ulcer, where he underwent left common femoral artery to anterior tibial artery bypass, followed by partial foot amputation by Dr. Lajoyce Corners, patient has been following as an outpatient with vascular, orthopedic, over the last week he started not feeling well, over the last 2 days he started to have some fever, chills, his blood sugar started to become uncontrolled then, as well  started to have some drainage lateral side of the wound, he had to area of dehiscence one is medially and one is laterally to the wound, patient has no leukocytosis, afebrile in ED (temp of 99.1) , foot x-ray showing area of lucency and clear of possible colitis versus osteopenia, received IV Flagyl, cefepime, vancomycin NAD, ED consulted orthopedic Dr. Magnus Ivan, and hospitalist requested to admit.    Review of Systems    In addition to the HPI above, Reports fever and chills. No Headache, No changes with Vision or hearing, No problems swallowing food or Liquids, No Chest pain, Cough or Shortness of Breath, No Abdominal pain, No Nausea or Vommitting, Bowel movements are regular, No Blood in  stool or Urine, No dysuria, No new skin rashes or bruises, No new joints pains-aches,  No new weakness, tingling, numbness in any extremity, No recent weight gain or loss, No polyuria, polydypsia or polyphagia, No significant Mental Stressors.  A full 10 point Review of Systems was done, except as stated above, all other Review of Systems were negative.   Social History History  Substance Use Topics  . Smoking status: Former Smoker    Types: Pipe    Quit date: 02/18/2011  . Smokeless tobacco: Never Used  . Alcohol  Use: No     Family History Family History  Problem Relation Age of Onset  . Diabetes Mother   . Diabetes Sister      Prior to Admission medications   Medication Sig Start Date End Date Taking? Authorizing Provider  acetaminophen (TYLENOL) 325 MG tablet Take 2 tablets (650 mg total) by mouth every 6 (six) hours as needed for mild pain (or Fever >/= 101). 02/07/14  Yes Roschelle Calandra, MD  albuterol (PROVENTIL) (2.5 MG/3ML) 0.083% nebulizer solution Take 3 mLs (2.5 mg total) by nebulization every 6 (six) hours as needed for wheezing. 02/07/14  Yes Laker Thompson, MD  amLODipine (NORVASC) 10 MG tablet Take 10 mg by mouth daily.   Yes Historical Provider, MD  aspirin 81 MG chewable tablet Chew 81 mg by mouth daily.   Yes Historical Provider, MD  docusate sodium 100 MG CAPS Take 100 mg by mouth 2 (two) times daily. 02/07/14  Yes Maureen Delatte, MD  feeding supplement, GLUCERNA SHAKE, (GLUCERNA SHAKE) LIQD Take 237 mLs by mouth 2 (two) times daily between meals. 02/07/14  Yes Deborahann Poteat, MD  insulin aspart (NOVOLOG) 100 UNIT/ML injection Inject 0-20 Units into the skin 3 (three) times daily with meals. Patient taking differently: Inject 0-10 Units into the skin 3 (three) times daily with meals. BCG 150-200= 2 units 201-250= 4 units 251-300= 6 units 301-350= 8 units 351-400= 10 units 02/07/14  Yes Arcenia Scarbro, MD  insulin glargine (LANTUS) 100 UNIT/ML injection Inject 0.25 mLs (25 Units total) into the skin daily. 02/07/14  Yes Dariana Garbett, MD  insulin glargine (LANTUS) 100 UNIT/ML injection Inject 0.2 mLs (20 Units total) into the skin at bedtime. Patient taking differently: Inject 20-25 Units into the skin at bedtime.  02/07/14  Yes Gwen Sarvis, MD  isosorbide mononitrate (IMDUR) 30 MG 24 hr tablet Take 30 mg by mouth daily.   Yes Historical Provider, MD  losartan (COZAAR) 100 MG tablet Take 100 mg by mouth daily. 01/23/14  Yes Historical Provider, MD  metoprolol  (TOPROL-XL) 100 MG 24 hr tablet Take 100 mg by mouth 2 (two) times daily.     Yes Historical Provider, MD  nitroGLYCERIN (NITRODUR - DOSED IN MG/24 HR) 0.2 mg/hr patch Place 1 patch onto the skin daily. 02/19/14  Yes Historical Provider, MD  nitroGLYCERIN (NITROSTAT) 0.4 MG SL tablet Place 0.4 mg under the tongue every 5 (five) minutes as needed for chest pain.    Yes Historical Provider, MD  ondansetron (ZOFRAN) 4 MG tablet Take 1 tablet (4 mg total) by mouth every 6 (six) hours as needed for nausea. 02/07/14  Yes Huey Bienenstock, MD  oxyCODONE-acetaminophen (PERCOCET/ROXICET) 5-325 MG per tablet Take 1-2 tablets by mouth every 6 (six) hours as needed for moderate pain or severe pain. 02/07/14  Yes Aston Lieske, MD  rosuvastatin (CRESTOR) 40 MG tablet Take 40 mg by mouth every evening.   Yes Historical Provider, MD  silver sulfADIAZINE (SILVADENE)  1 % cream Apply 1 application topically daily. 02/19/14  Yes Historical Provider, MD  Amino Acids-Protein Hydrolys (FEEDING SUPPLEMENT, PRO-STAT SUGAR FREE 64,) LIQD Take 30 mLs by mouth 2 (two) times daily between meals. 02/07/14   Jazzelle Zhang, MD  mirtazapine (REMERON) 7.5 MG tablet Take 1 tablet (7.5 mg total) by mouth at bedtime. 02/07/14   Veeda Virgo, MD  pantoprazole (PROTONIX) 40 MG tablet Take 1 tablet (40 mg total) by mouth daily. 02/07/14   Huey Bienenstockawood Ames Hoban, MD    Allergies  Allergen Reactions  . Cilostazol Other (See Comments)    Feels like feet on fire    Physical Exam  Vitals  Blood pressure 122/65, pulse 86, temperature 99.1 F (37.3 C), temperature source Oral, resp. rate 23, height 5\' 9"  (1.753 m), weight 91.173 kg (201 lb), SpO2 98 %.   1. General frail elderly male lying in bed in NAD,    2. Normal affect and insight, Not Suicidal or Homicidal, Awake Alert, Oriented X 3.  3. No F.N deficits, ALL C.Nerves Intact, Strength 5/5 all 4 extremities, Sensation intact all 4 extremities, Plantars down going.  4. Ears and  Eyes appear Normal, Conjunctivae clear, PERRLA. Moist Oral Mucosa.  5. Supple Neck, No JVD, No cervical lymphadenopathy appriciated, No Carotid Bruits.  6. Symmetrical Chest wall movement, Good air movement bilaterally, CTAB.  7. RRR, No Gallops, Rubs or Murmurs, No Parasternal Heave.  8. Positive Bowel Sounds, Abdomen Soft, No tenderness, No organomegaly appriciated,No rebound -guarding or rigidity.  9.  No Cyanosis, Normal Skin Turgor, left foot wound with medial and lateral dehiscence, minimal purulent blood-tinged discharge from lateral dehiscence area.  10. Good muscle tone,  joints appear normal , no effusions, Normal ROM.  11. No Palpable Lymph Nodes in Neck or Axillae    Data Review  CBC  Recent Labs Lab 03/20/14 1320  WBC 11.5*  HGB 9.4*  HCT 29.9*  PLT 299  MCV 80.4  MCH 25.3*  MCHC 31.4  RDW 16.4*  LYMPHSABS 2.0  MONOABS 0.9  EOSABS 0.1  BASOSABS 0.0   ------------------------------------------------------------------------------------------------------------------  Chemistries   Recent Labs Lab 03/20/14 1320  NA 131*  K 4.6  CL 91*  CO2 26  GLUCOSE 401*  BUN 11  CREATININE 1.10  CALCIUM 9.1   ------------------------------------------------------------------------------------------------------------------ estimated creatinine clearance is 66.7 mL/min (by C-G formula based on Cr of 1.1). ------------------------------------------------------------------------------------------------------------------ No results for input(s): TSH, T4TOTAL, T3FREE, THYROIDAB in the last 72 hours.  Invalid input(s): FREET3   Coagulation profile No results for input(s): INR, PROTIME in the last 168 hours. ------------------------------------------------------------------------------------------------------------------- No results for input(s): DDIMER in the last 72  hours. -------------------------------------------------------------------------------------------------------------------  Cardiac Enzymes  Recent Labs Lab 03/20/14 1320  TROPONINI <0.30   ------------------------------------------------------------------------------------------------------------------ Invalid input(s): POCBNP   ---------------------------------------------------------------------------------------------------------------  Urinalysis    Component Value Date/Time   COLORURINE YELLOW 03/20/2014 1549   APPEARANCEUR CLEAR 03/20/2014 1549   LABSPEC 1.019 03/20/2014 1549   PHURINE 7.0 03/20/2014 1549   GLUCOSEU >1000* 03/20/2014 1549   GLUCOSEU 250 12/19/2010 1625   HGBUR TRACE* 03/20/2014 1549   BILIRUBINUR NEGATIVE 03/20/2014 1549   KETONESUR NEGATIVE 03/20/2014 1549   PROTEINUR 100* 03/20/2014 1549   UROBILINOGEN 0.2 03/20/2014 1549   NITRITE NEGATIVE 03/20/2014 1549   LEUKOCYTESUR NEGATIVE 03/20/2014 1549    ----------------------------------------------------------------------------------------------------------------  Imaging results:   Dg Chest Port 1 View  03/20/2014   CLINICAL DATA:  Fever and chest pain for 1 week.  EXAM: PORTABLE CHEST - 1 VIEW  COMPARISON:  04/06/2008  FINDINGS: Chronic elevation of the right hemidiaphragm. Mild volume loss at the right lung base. Heart size is stable. Trachea is midline. No acute bone abnormality. No focal lung disease.  IMPRESSION: No acute chest findings.  Chronic elevation of the right hemidiaphragm.   Electronically Signed   By: Richarda OverlieAdam  Henn M.D.   On: 03/20/2014 16:23   Dg Foot Complete Left  03/20/2014   CLINICAL DATA:  History of partial foot amputation. Small wound where the small toe was. Low grade fever.  EXAM: LEFT FOOT - COMPLETE 3+ VIEW  COMPARISON:  None.  FINDINGS: Patient had amputation in the mid tarsal region. There are small bone fragments near the amputation site which are probably postsurgical.  The cortical margin of the remaining tarsal bones at the amputation site are ill-defined particularly at the cuboid. Difficult to exclude any erosions or cortical destruction in this area. No gross abnormality in the hindfoot.  IMPRESSION: Post amputation in the mid tarsal region. There is lucency along the lateral amputation site involving the cuboid. Findings could be related to osteopenia. Difficult to exclude cortical destruction or osteomyelitis in this area. This could be further evaluated with MRI.   Electronically Signed   By: Richarda OverlieAdam  Henn M.D.   On: 03/20/2014 15:19        Assessment & Plan  Principal Problem:   Infected wound Active Problems:   Diabetes   Hyperlipidemia   Essential hypertension   Coronary atherosclerosis   Cerebrovascular disease   PAD (peripheral artery disease)   Diabetic foot ulcer    Diabetic foot wound, infected -Diabetic foot ulcer order set has been used, patient is on IV vancomycin, cefepime, Flagyl, will send blood cultures, orthopedic Dr. Rayburn MaBlackmon consulted by ED, will obtain MRI of left foot without contrast. -Patient already had revascularization surgery during last admission, but will check ABI.  Diabetes mellitus -We'll see the patient back on Lantus, but we will lower the dose, from 25 to 20, and add insulin sliding scale. - Take hemoglobin A1c.  Hyperlipidemia -Continue with statin  PAD -Status post revascularization, last admission. -Continue with aspirin -Check ABI   Hypertension -Continue home medication  Coronary artery disease -Continue with aspirin, Toprol, losartan, Imdur.  DVT Prophylaxis Heparin -   AM Labs Ordered, also please review Full Orders  Family Communication: Admission, patients condition and plan of care including tests being ordered have been discussed with the patient and wife who indicate understanding and agree with the plan and Code Status.  Code Status full  Disposition: Admit to  inpatient  Condition GUARDED    Time spent in minutes : 60 minutes    Donda Friedli M.D on 03/20/2014 at 4:29 PM  Between 7am to 7pm - Pager - [phone]  After 7pm go to [email or web address] - password [email or web address]  And look for the night coverage person covering me after hours  [facility] Office  [phone]   **Disclaimer: This note may have been dictated with voice recognition software. Similar sounding words can inadvertently be transcribed and this note may contain transcription errors which may not have been corrected upon publication of note.**

## 2014-03-20 NOTE — Progress Notes (Signed)
Patient ID: Jordan [name], male   DOB: September 19, 1940, 73 y.o.   MRN: [id number] Mr. Judithann GravesFarrar is a regular patient of Dr. Lajoyce Cornersuda and has had regular follow-ups in our office.  Has left foot transmet/midfoot amputation has started to breakdown a little and he is feeling a little sick and fatigued.  He is currently afebrile with stable vitals.  I place a wet-to-dry dressing on his foot wound and will have Dr. Lajoyce Cornersuda assess it tomorrow morning.

## 2014-03-20 NOTE — ED Notes (Signed)
Patient transported to X-ray 

## 2014-03-20 NOTE — Progress Notes (Signed)
ANTIBIOTIC CONSULT NOTE - INITIAL  Pharmacy Consult for vancomycin  Indication: osteomyelitis  Allergies  Allergen Reactions  . Cilostazol Other (See Comments)    Feels like feet on fire    Patient Measurements: Height: 5\' 9"  (175.3 cm) Weight: 201 lb (91.173 kg) IBW/kg (Calculated) : 70.7   Vital Signs: Temp: 99.1 F (37.3 C) (12/20 1321) Temp Source: Oral (12/20 1321) BP: 141/57 mmHg (12/20 1450) Pulse Rate: 86 (12/20 1450) Intake/Output from previous day:   Intake/Output from this shift:    Labs:  Recent Labs  03/20/14 1320  WBC 11.5*  HGB 9.4*  PLT 299  CREATININE 1.10   Estimated Creatinine Clearance: 66.7 mL/min (by C-G formula based on Cr of 1.1). No results for input(s): VANCOTROUGH, VANCOPEAK, VANCORANDOM, GENTTROUGH, GENTPEAK, GENTRANDOM, TOBRATROUGH, TOBRAPEAK, TOBRARND, AMIKACINPEAK, AMIKACINTROU, AMIKACIN in the last 72 hours.   Microbiology: No results found for this or any previous visit (from the past 720 hour(s)).  Medical History: Past Medical History  Diagnosis Date  . CAD (coronary artery disease)     a. s/p IMI in past tx with POBA and cath 6 mos later with occluded RCA;  b. h/o Taxus DES to LAD and CFX;  c.  cath 1/10: LM ok, mLAD 40-50%, LAD stent ok, D2 tandem 60-70%, pCFX 30%, mAVCFX stent ok, pRCA 40%, RV 90%, RV marginal 90%, dRCA filled L-R collats tx medically  . Carotid stenosis     dopplers 5/11: 40-59% bilat  . DM2 (diabetes mellitus, type 2)   . HTN (hypertension)   . HLD (hyperlipidemia)   . Diabetes mellitus    Assessment: 73 yom for foot infection. S/p left forefoot amputation on 10/30, second sx on early November to take additional tissue. No wound care has been done at home. Xray now shows osteomyelitis, vancomycin ordered. Tmax 99.1, wbc 11.5. Renal function normal.  Goal of Therapy:  Vancomycin trough level 15-20 mcg/ml  Plan:  Measure antibiotic drug levels at steady state Follow up culture results Vancomycin  1250 q12 hours  Sheppard CoilFrank Wilson PharmD., BCPS Clinical Pharmacist Pager [phone] 03/20/2014 3:51 PM

## 2014-03-20 NOTE — ED Provider Notes (Addendum)
CSN: [id number]     Arrival date & time 03/20/14  1312 History   First MD Initiated Contact with Patient 03/20/14 1321     Chief Complaint  Patient presents with  . Post-op Problem      HPI  She presents evaluation of fever, not feeling well, left foot wound drainage. Symptoms for approximately one week now. 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 He has been home since then recheck seeding every other day home health care nursing for dressing changes and also saw his orthopedist on Wednesday.  States when he saw his orthopedist and wound looked well. The following day he developed a "pinhole" which started draining some blood and purulence. This has since become worse with drainage of wound is somewhat dehisced and now he presents here with continuation of feeling feverish at home with temperature 101 today.  Also states that he fell sleep last night with his right arm underneath him and his right arm and right chest are now sore. He has not had a cough sputum production or difficulty breathing. He is tender to touch on the right chest wall.  Not short of breath. No pleuritic discomfort.  Past Medical History  Diagnosis Date  . CAD (coronary artery disease)     a. s/p IMI in past tx with POBA and cath 6 mos later with occluded RCA;  b. h/o Taxus DES to LAD and CFX;  c.  cath 1/10: LM ok, mLAD 40-50%, LAD stent ok, D2 tandem 60-70%, pCFX 30%, mAVCFX stent ok, pRCA 40%, RV 90%, RV marginal 90%, dRCA filled L-R collats tx medically  . Carotid stenosis     dopplers 5/11: 40-59% bilat  . DM2 (diabetes mellitus, type 2)   . HTN (hypertension)   . HLD (hyperlipidemia)   . Diabetes mellitus    Past Surgical History  Procedure Laterality Date  . Cardiac catheterization    . Femoral-tibial bypass graft Left 01/28/2014    Procedure:   FEMORAL-ANTERIOR TIBIAL ARTERY Bypass Graft utilizing composite gortex graft and vein graft;  Surgeon: [name], MD;  Location: [facility] OR;  Service: Vascular;  Laterality: Left;  . Amputation Left 01/28/2014    Procedure: AMPUTATION DIGIT- LEFT 5TH TOE;  Surgeon: [name], MD;  Location: [facility] OR;  Service: Vascular;  Laterality: Left;  . Amputation Left 02/02/2014    Procedure: Left 4th Ray Amputation vs Transmetatarsal Amputation;  Surgeon: [name], MD;  Location: [facility] OR;  Service: Orthopedics;  Laterality: Left;  . Abdominal aortagram N/A 04/07/2013    Procedure: ABDOMINAL [name];  Surgeon: [name], MD;  Location: [facility] CATH LAB;  Service: Cardiovascular;  Laterality: N/A;  . Lower extremity angiogram Left 01/26/2014    Procedure: LOWER EXTREMITY ANGIOGRAM;  Surgeon: [name], MD;  Location: [facility] CATH LAB;  Service: Cardiovascular;  Laterality: Left;  . Abdominal aortagram N/A 01/26/2014    Procedure: ABDOMINAL AORTAGRAM;  Surgeon: [name], MD;  Location: [facility] CATH LAB;  Service: Cardiovascular;  Laterality: N/A;   Family History  Problem Relation Age of Onset  . Diabetes Mother   . Diabetes Sister    History  Substance Use Topics  . Smoking status: Former Smoker    Types: Pipe    Quit date: 02/18/2011  . Smokeless tobacco: Never Used  . Alcohol Use: No    Review of Systems  Constitutional: Negative for fever, chills, diaphoresis, appetite change and fatigue.  HENT: Negative for mouth sores, sore throat and trouble swallowing.   Eyes: Negative for visual disturbance.  Respiratory: Negative for cough, chest tightness, shortness of breath and wheezing.   Cardiovascular: Positive for chest pain.  Gastrointestinal: Negative for nausea, vomiting, abdominal pain, diarrhea and abdominal distention.  Endocrine: Negative for polydipsia, polyphagia and polyuria.  Genitourinary: Negative for dysuria, frequency and hematuria.  Musculoskeletal:  Negative for gait problem.       Right arm pain. Drainage from the left foot.  Skin: Positive for wound. Negative for color change, pallor and rash.  Neurological: Negative for dizziness, syncope, light-headedness and headaches.  Hematological: Does not bruise/bleed easily.  Psychiatric/Behavioral: Negative for behavioral problems and confusion.      Allergies  Cilostazol  Home Medications   Prior to Admission medications   Medication Sig Start Date End Date Taking? Authorizing Provider  acetaminophen (TYLENOL) 325 MG tablet Take 2 tablets (650 mg total) by mouth every 6 (six) hours as needed for mild pain (or Fever >/= 101). 02/07/14  Yes Dawood Elgergawy, MD  albuterol (PROVENTIL) (2.5 MG/3ML) 0.083% nebulizer solution Take 3 mLs (2.5 mg total) by nebulization every 6 (six) hours as needed for wheezing. 02/07/14  Yes Dawood Elgergawy, MD  amLODipine (NORVASC) 10 MG tablet Take 10 mg by mouth daily.   Yes Historical Provider, MD  aspirin 81 MG chewable tablet Chew 81 mg by mouth daily.   Yes Historical Provider, MD  docusate sodium 100 MG CAPS Take 100 mg by mouth 2 (two) times daily. 02/07/14  Yes Dawood Elgergawy, MD  feeding supplement, GLUCERNA SHAKE, (GLUCERNA SHAKE) LIQD Take 237 mLs by mouth 2 (two) times daily between meals. 02/07/14  Yes Dawood Elgergawy, MD  insulin aspart (NOVOLOG) 100 UNIT/ML injection Inject 0-20 Units into the skin 3 (three) times daily with meals. Patient taking differently: Inject 0-10 Units into the skin 3 (three) times daily with meals. BCG 150-200= 2 units 201-250= 4 units 251-300= 6 units 301-350= 8 units 351-400= 10 units 02/07/14  Yes Dawood Elgergawy, MD  insulin glargine (LANTUS) 100 UNIT/ML injection Inject 0.25 mLs (25 Units total) into the skin daily. 02/07/14  Yes Dawood Elgergawy, MD  insulin glargine (LANTUS) 100 UNIT/ML injection Inject 0.2 mLs (20 Units total) into the skin at bedtime. Patient taking differently: Inject 20-25 Units into the  skin at bedtime.  02/07/14  Yes Dawood Elgergawy, MD  isosorbide mononitrate (IMDUR) 30 MG 24 hr tablet Take 30 mg by mouth daily.   Yes Historical Provider, MD  losartan (COZAAR) 100 MG tablet Take 100 mg by mouth daily. 01/23/14  Yes Historical Provider, MD  metoprolol (TOPROL-XL) 100 MG 24 hr tablet Take 100 mg by mouth 2 (two) times daily.     Yes Historical Provider, MD  nitroGLYCERIN (NITRODUR - DOSED IN MG/24 HR) 0.2 mg/hr patch Place 1 patch onto the skin daily. 02/19/14  Yes Historical Provider, MD  nitroGLYCERIN (NITROSTAT) 0.4 MG SL tablet Place 0.4 mg under the tongue every 5 (five) minutes as needed for chest pain.    Yes Historical Provider, MD  ondansetron (ZOFRAN) 4 MG tablet Take 1 tablet (4 mg total) by mouth every 6 (six) hours as needed for nausea. 02/07/14  Yes Huey Bienenstockawood Elgergawy, MD  oxyCODONE-acetaminophen (PERCOCET/ROXICET) 5-325 MG per tablet Take 1-2 tablets by mouth every 6 (six) hours as needed for moderate pain or severe pain. 02/07/14  Yes Dawood Elgergawy, MD  rosuvastatin (CRESTOR) 40 MG  tablet Take 40 mg by mouth every evening.   Yes Historical Provider, MD  silver sulfADIAZINE (SILVADENE) 1 % cream Apply 1 application topically daily. 02/19/14  Yes Historical Provider, MD  Amino Acids-Protein Hydrolys (FEEDING SUPPLEMENT, PRO-STAT SUGAR FREE 64,) LIQD Take 30 mLs by mouth 2 (two) times daily between meals. 02/07/14   Dawood Elgergawy, MD  mirtazapine (REMERON) 7.5 MG tablet Take 1 tablet (7.5 mg total) by mouth at bedtime. 02/07/14   Dawood Elgergawy, MD  pantoprazole (PROTONIX) 40 MG tablet Take 1 tablet (40 mg total) by mouth daily. 02/07/14   Dawood Elgergawy, MD   BP 122/65 mmHg  Pulse 86  Temp(Src) 99.1 F (37.3 C) (Oral)  Resp 23  Ht 5\' 9"  (1.753 m)  Wt 201 lb (91.173 kg)  BMI 29.67 kg/m2  SpO2 98% Physical Exam  Constitutional: He is oriented to person, place, and time. He appears well-developed and well-nourished. No distress.  HENT:  Head: Normocephalic.    Eyes: Conjunctivae are normal. Pupils are equal, round, and reactive to light. No scleral icterus.  Neck: Normal range of motion. Neck supple. No thyromegaly present.  Cardiovascular: Normal rate and regular rhythm.  Exam reveals no gallop and no friction rub.   No murmur heard. Pulmonary/Chest: Effort normal and breath sounds normal. No respiratory distress. He has no wheezes. He has no rales.    Abdominal: Soft. Bowel sounds are normal. He exhibits no distension. There is no tenderness. There is no rebound.  Musculoskeletal: Normal range of motion.       Feet:  Neurological: He is alert and oriented to person, place, and time.  Skin: Skin is warm and dry. No rash noted.  Psychiatric: He has a normal mood and affect. His behavior is normal.    ED Course  Procedures (including critical care time) Labs Review Labs Reviewed  BASIC METABOLIC PANEL - Abnormal; Notable for the following:    Sodium 131 (*)    Chloride 91 (*)    Glucose, Bld 401 (*)    GFR calc non Af Amer 65 (*)    GFR calc Af Amer 75 (*)    All other components within normal limits  CBC WITH DIFFERENTIAL - Abnormal; Notable for the following:    WBC 11.5 (*)    RBC 3.72 (*)    Hemoglobin 9.4 (*)    HCT 29.9 (*)    MCH 25.3 (*)    RDW 16.4 (*)    Neutro Abs 8.5 (*)    All other components within normal limits  I-STAT CG4 LACTIC ACID, ED - Abnormal; Notable for the following:    Lactic Acid, Venous 2.36 (*)    All other components within normal limits  CULTURE, BLOOD (ROUTINE X 2)  CULTURE, BLOOD (ROUTINE X 2)  URINE CULTURE  TROPONIN I  URINALYSIS, ROUTINE W REFLEX MICROSCOPIC    Imaging Review Dg Foot Complete Left  03/20/2014   CLINICAL DATA:  History of partial foot amputation. Small wound where the small toe was. Low grade fever.  EXAM: LEFT FOOT - COMPLETE 3+ VIEW  COMPARISON:  None.  FINDINGS: Patient had amputation in the mid tarsal region. There are small bone fragments near the amputation site  which are probably postsurgical. The cortical margin of the remaining tarsal bones at the amputation site are ill-defined particularly at the cuboid. Difficult to exclude any erosions or cortical destruction in this area. No gross abnormality in the hindfoot.  IMPRESSION: Post amputation in the mid tarsal region. There  is lucency along the lateral amputation site involving the cuboid. Findings could be related to osteopenia. Difficult to exclude cortical destruction or osteomyelitis in this area. This could be further evaluated with MRI.   Electronically Signed   By: Richarda OverlieAdam  Henn M.D.   On: 03/20/2014 15:19     EKG Interpretation None      MDM   Final diagnoses:  Foot infection  Fever    Care discussed with Dr. Magnus IvanBlackman, on-call for the patient's orthopedist Dr. Lajoyce Cornersuda. Patient will be seen in consultation for 3 weeks. Dr[facility] Magn[facility]Black[facility] I relayed the patient's information. Care discussed with try hospitalist regarding admission.    Rolland PorterMark Kailynne Ferrington, MD 03/20/14 1613  Rolland PorterMark Anandi Abramo, MD 03/20/14 [phone]

## 2014-03-20 NOTE — ED Notes (Signed)
Lactic acid results given to Dr. James 

## 2014-03-21 DIAGNOSIS — I739 Peripheral vascular disease, unspecified: Secondary | ICD-10-CM

## 2014-03-21 DIAGNOSIS — R6510 Systemic inflammatory response syndrome (SIRS) of non-infectious origin without acute organ dysfunction: Secondary | ICD-10-CM | POA: Diagnosis present

## 2014-03-21 DIAGNOSIS — E785 Hyperlipidemia, unspecified: Secondary | ICD-10-CM

## 2014-03-21 DIAGNOSIS — E43 Unspecified severe protein-calorie malnutrition: Secondary | ICD-10-CM | POA: Diagnosis present

## 2014-03-21 DIAGNOSIS — E1159 Type 2 diabetes mellitus with other circulatory complications: Secondary | ICD-10-CM

## 2014-03-21 DIAGNOSIS — A419 Sepsis, unspecified organism: Secondary | ICD-10-CM

## 2014-03-21 LAB — COMPREHENSIVE METABOLIC PANEL
ALT: 25 U/L (ref 0–53)
AST: 32 U/L (ref 0–37)
Albumin: 2.3 g/dL — ABNORMAL LOW (ref 3.5–5.2)
Alkaline Phosphatase: 117 U/L (ref 39–117)
Anion gap: 15 (ref 5–15)
BUN: 11 mg/dL (ref 6–23)
CO2: 23 mEq/L (ref 19–32)
Calcium: 8.7 mg/dL (ref 8.4–10.5)
Chloride: 93 mEq/L — ABNORMAL LOW (ref 96–112)
Creatinine, Ser: 1.05 mg/dL (ref 0.50–1.35)
GFR calc Af Amer: 79 mL/min — ABNORMAL LOW (ref >90)
GFR calc non Af Amer: 68 mL/min — ABNORMAL LOW (ref >90)
Glucose, Bld: 325 mg/dL — ABNORMAL HIGH (ref 70–99)
Potassium: 4.6 mEq/L (ref 3.7–5.3)
Sodium: 131 mEq/L — ABNORMAL LOW (ref 137–147)
Total Bilirubin: 0.4 mg/dL (ref 0.3–1.2)
Total Protein: 8.3 g/dL (ref 6.0–8.3)

## 2014-03-21 LAB — GLUCOSE, CAPILLARY
Glucose-Capillary: 343 mg/dL — ABNORMAL HIGH (ref 70–99)
Glucose-Capillary: 357 mg/dL — ABNORMAL HIGH (ref 70–99)
Glucose-Capillary: 316 mg/dL — ABNORMAL HIGH (ref 70–99)
Glucose-Capillary: 333 mg/dL — ABNORMAL HIGH (ref 70–99)

## 2014-03-21 LAB — URINE CULTURE
Colony Count: NO GROWTH
Culture: NO GROWTH
Special Requests: NORMAL

## 2014-03-21 LAB — CBC
HCT: 29.9 % — ABNORMAL LOW (ref 39.0–52.0)
Hemoglobin: 9.7 g/dL — ABNORMAL LOW (ref 13.0–17.0)
MCH: 26.4 pg (ref 26.0–34.0)
MCHC: 32.4 g/dL (ref 30.0–36.0)
MCV: 81.3 fL (ref 78.0–100.0)
Platelets: 384 10*3/uL (ref 150–400)
RBC: 3.68 MIL/uL — ABNORMAL LOW (ref 4.22–5.81)
RDW: 16.6 % — ABNORMAL HIGH (ref 11.5–15.5)
WBC: 12 10*3/uL — ABNORMAL HIGH (ref 4.0–10.5)

## 2014-03-21 LAB — HEMOGLOBIN A1C
Hgb A1c MFr Bld: 8.4 % — ABNORMAL HIGH (ref <5.7)
Mean Plasma Glucose: 194 mg/dL — ABNORMAL HIGH (ref <117)

## 2014-03-21 LAB — C-REACTIVE PROTEIN: CRP: 30 mg/dL — ABNORMAL HIGH (ref <0.60)

## 2014-03-21 MED ORDER — INSULIN ASPART 100 UNIT/ML ~~LOC~~ SOLN
4.0000 [IU] | Freq: Once | SUBCUTANEOUS | Status: AC
  Administered 2014-03-21: 4 [IU] via SUBCUTANEOUS

## 2014-03-21 MED ORDER — INSULIN GLARGINE 100 UNIT/ML ~~LOC~~ SOLN
28.0000 [IU] | Freq: Every day | SUBCUTANEOUS | Status: DC
  Administered 2014-03-22 – 2014-03-23 (×2): 28 [IU] via SUBCUTANEOUS
  Administered 2014-03-24: 14 [IU] via SUBCUTANEOUS
  Filled 2014-03-21 (×3): qty 0.28

## 2014-03-21 MED ORDER — SILVER SULFADIAZINE 1 % EX CREA: TOPICAL_CREAM | Freq: Every day | CUTANEOUS | Status: DC

## 2014-03-21 MED ORDER — NITROGLYCERIN 0.2 MG/HR TD PT24
0.2000 mg | MEDICATED_PATCH | Freq: Every day | TRANSDERMAL | Status: DC
  Administered 2014-03-21 – 2014-03-24 (×4): 0.2 mg via TRANSDERMAL
  Filled 2014-03-21 (×4): qty 1

## 2014-03-21 MED ORDER — INSULIN ASPART 100 UNIT/ML ~~LOC~~ SOLN
0.0000 [IU] | Freq: Three times a day (TID) | SUBCUTANEOUS | Status: DC
  Administered 2014-03-22 (×2): 11 [IU] via SUBCUTANEOUS
  Administered 2014-03-22 – 2014-03-23 (×2): 15 [IU] via SUBCUTANEOUS

## 2014-03-21 MED ORDER — BOOST PLUS PO LIQD
237.0000 mL | Freq: Two times a day (BID) | ORAL | Status: DC
  Administered 2014-03-21 – 2014-03-30 (×12): 237 mL via ORAL
  Filled 2014-03-21 (×24): qty 237

## 2014-03-21 NOTE — Progress Notes (Signed)
VASCULAR LAB PRELIMINARY  ARTERIAL  ABI completed:ABIs WNL.  Right TBI WNL.  Unable to perform left TBI secondary to left forefoot amputation.    RIGHT    LEFT    PRESSURE WAVEFORM  PRESSURE WAVEFORM  BRACHIAL Can't extend arm  BRACHIAL 148 T  DP     DP    AT 143 M AT 147 B  PT 142 M PT    PER   PER 131 M  GREAT TOE 138 NA GREAT TOE  NA    RIGHT LEFT  ABI 0.97 0.99     Jerian Morais, RVT 03/21/2014, 9:32 AM

## 2014-03-21 NOTE — Consult Note (Signed)
Reason for Consult: Fever and chills and feeling lethargic Referring Physician: . Jordan [name] is an 73 y.o. male.  HPI: Patient is a 73 year old gentleman status post midfoot amputation on the left and status post revascularization to the left lower extremity. Patient states he's been having intermittent fevers and feeling lethargic  Past Medical History  Diagnosis Date  . CAD (coronary artery disease)     a. s/p IMI in past tx with POBA and cath 6 mos later with occluded RCA;  b. h/o Taxus DES to LAD and CFX;  c.  cath 1/10: LM ok, mLAD 40-50%, LAD stent ok, D2 tandem 60-70%, pCFX 30%, mAVCFX stent ok, pRCA 40%, RV 90%, RV marginal 90%, dRCA filled L-R collats tx medically  . Carotid stenosis     dopplers 5/11: 40-59% bilat  . DM2 (diabetes mellitus, type 2)   . HTN (hypertension)   . HLD (hyperlipidemia)   . Diabetes mellitus     Past Surgical History  Procedure Laterality Date  . Cardiac catheterization    . Femoral-tibial bypass graft Left 01/28/2014    Procedure:  FEMORAL-ANTERIOR TIBIAL ARTERY Bypass Graft utilizing composite gortex graft and vein graft;  Surgeon: Angelia Mould, MD;  Location: [place];  Service: Vascular;  Laterality: Left;  . Amputation Left 01/28/2014    Procedure: AMPUTATION DIGIT- LEFT 5TH TOE;  Surgeon: Angelia Mould, MD;  Location: [place];  Service: Vascular;  Laterality: Left;  . Amputation Left 02/02/2014    Procedure: Left 4th Ray Amputation vs Transmetatarsal Amputation;  Surgeon: Newt Minion, MD;  Location: [place];  Service: Orthopedics;  Laterality: Left;  . Abdominal aortagram N/A 04/07/2013    Procedure: ABDOMINAL Maxcine Ham;  Surgeon: Wellington Hampshire, MD;  Location: [facility] CATH LAB;  Service: Cardiovascular;  Laterality: N/A;  . Lower extremity angiogram Left 01/26/2014    Procedure: LOWER EXTREMITY ANGIOGRAM;  Surgeon: Wellington Hampshire, MD;  Location: [place] CATH LAB;  Service: Cardiovascular;  Laterality: Left;  . Abdominal  aortagram N/A 01/26/2014    Procedure: ABDOMINAL AORTAGRAM;  Surgeon: Wellington Hampshire, MD;  Location: [facility] CATH LAB;  Service: Cardiovascular;  Laterality: N/A;    Family History  Problem Relation Age of Onset  . Diabetes Mother   . Diabetes Sister     Social History:  reports that he quit smoking about 3 years ago. His smoking use included Pipe. He has never used smokeless tobacco. He reports that he does not drink alcohol or use illicit drugs.  Allergies:  Allergies  Allergen Reactions  . Cilostazol Other (See Comments)    Feels like feet on fire    Medications: I have reviewed the patient's current medications.  Results for orders placed or performed during the hospital encounter of 03/20/14 (from the past 48 hour(s))  Basic metabolic panel     Status: Abnormal   Collection Time: 03/20/14  1:20 PM  Result Value Ref Range   Sodium 131 (L) 137 - 147 mEq/L   Potassium 4.6 3.7 - 5.3 mEq/L   Chloride 91 (L) 96 - 112 mEq/L   CO2 26 19 - 32 mEq/L   Glucose, Bld 401 (H) 70 - 99 mg/dL   BUN 11 6 - 23 mg/dL   Creatinine, Ser 1.10 0.50 - 1.35 mg/dL   Calcium 9.1 8.4 - 10.5 mg/dL   GFR calc non Af Amer 65 (L) >90 mL/min   GFR calc Af Amer 75 (L) >90 mL/min    Comment: (NOTE)  The eGFR has been calculated using the CKD EPI equation. This calculation has not been validated in all clinical situations. eGFR's persistently <90 mL/min signify possible Chronic Kidney Disease.    Anion gap 14 5 - 15  CBC with Differential     Status: Abnormal   Collection Time: 03/20/14  1:20 PM  Result Value Ref Range   WBC 11.5 (H) 4.0 - 10.5 K/uL   RBC 3.72 (L) 4.22 - 5.81 MIL/uL   Hemoglobin 9.4 (L) 13.0 - 17.0 g/dL   HCT 29.9 (L) 39.0 - 52.0 %   MCV 80.4 78.0 - 100.0 fL   MCH 25.3 (L) 26.0 - 34.0 pg   MCHC 31.4 30.0 - 36.0 g/dL   RDW 16.4 (H) 11.5 - 15.5 %   Platelets 299 150 - 400 K/uL   Neutrophils Relative % 74 43 - 77 %   Neutro Abs 8.5 (H) 1.7 - 7.7 K/uL   Lymphocytes Relative 17 12 -  46 %   Lymphs Abs 2.0 0.7 - 4.0 K/uL   Monocytes Relative 8 3 - 12 %   Monocytes Absolute 0.9 0.1 - 1.0 K/uL   Eosinophils Relative 1 0 - 5 %   Eosinophils Absolute 0.1 0.0 - 0.7 K/uL   Basophils Relative 0 0 - 1 %   Basophils Absolute 0.0 0.0 - 0.1 K/uL  Troponin I     Status: None   Collection Time: 03/20/14  1:20 PM  Result Value Ref Range   Troponin I <0.30 <0.30 ng/mL    Comment:        Due to the release kinetics of cTnI, a negative result within the first hours of the onset of symptoms does not rule out myocardial infarction with certainty. If myocardial infarction is still suspected, repeat the test at appropriate intervals.   I-Stat CG4 Lactic Acid, ED     Status: Abnormal   Collection Time: 03/20/14  2:22 PM  Result Value Ref Range   Lactic Acid, Venous 2.36 (H) 0.5 - 2.2 mmol/L  Urinalysis, Routine w reflex microscopic     Status: Abnormal   Collection Time: 03/20/14  3:49 PM  Result Value Ref Range   Color, Urine YELLOW YELLOW   APPearance CLEAR CLEAR   Specific Gravity, Urine 1.019 1.005 - 1.030   pH 7.0 5.0 - 8.0   Glucose, UA >1000 (A) NEGATIVE mg/dL   Hgb urine dipstick TRACE (A) NEGATIVE   Bilirubin Urine NEGATIVE NEGATIVE   Ketones, ur NEGATIVE NEGATIVE mg/dL   Protein, ur 100 (A) NEGATIVE mg/dL   Urobilinogen, UA 0.2 0.0 - 1.0 mg/dL   Nitrite NEGATIVE NEGATIVE   Leukocytes, UA NEGATIVE NEGATIVE  Urine microscopic-add on     Status: None   Collection Time: 03/20/14  3:49 PM  Result Value Ref Range   RBC / HPF 0-2 <3 RBC/hpf  Glucose, capillary     Status: Abnormal   Collection Time: 03/20/14  6:18 PM  Result Value Ref Range   Glucose-Capillary 377 (H) 70 - 99 mg/dL  Hemoglobin A1c     Status: Abnormal   Collection Time: 03/20/14  6:39 PM  Result Value Ref Range   Hgb A1c MFr Bld 8.4 (H) <5.7 %    Comment: (NOTE)  According to the ADA Clinical Practice Recommendations for 2011,  when HbA1c is used as a screening test:  >=6.5%   Diagnostic of Diabetes Mellitus           (if abnormal result is confirmed) 5.7-6.4%   Increased risk of developing Diabetes Mellitus References:Diagnosis and Classification of Diabetes Mellitus,Diabetes QMGN,0037,04(UGQBV 1):S62-S69 and Standards of Medical Care in         Diabetes - 2011,Diabetes QXIH,0388,82 (Suppl 1):S11-S61.    Mean Plasma Glucose 194 (H) <117 mg/dL    Comment: Performed at [facility]  Sedimentation rate     Status: Abnormal   Collection Time: 03/20/14  6:39 PM  Result Value Ref Range   Sed Rate 135 (H) 0 - 16 mm/hr  C-reactive protein     Status: Abnormal   Collection Time: 03/20/14  6:39 PM  Result Value Ref Range   CRP 30.0 (H) <0.60 mg/dL    Comment: Performed at [facility]  Albumin     Status: Abnormal   Collection Time: 03/20/14  6:39 PM  Result Value Ref Range   Albumin 2.4 (L) 3.5 - 5.2 g/dL  Glucose, capillary     Status: Abnormal   Collection Time: 03/20/14  9:28 PM  Result Value Ref Range   Glucose-Capillary 287 (H) 70 - 99 mg/dL  Comprehensive metabolic panel     Status: Abnormal   Collection Time: 03/21/14  5:10 AM  Result Value Ref Range   Sodium 131 (L) 137 - 147 mEq/L   Potassium 4.6 3.7 - 5.3 mEq/L    Comment: HEMOLYSIS AT THIS LEVEL MAY AFFECT RESULT   Chloride 93 (L) 96 - 112 mEq/L   CO2 23 19 - 32 mEq/L   Glucose, Bld 325 (H) 70 - 99 mg/dL   BUN 11 6 - 23 mg/dL   Creatinine, Ser 1.05 0.50 - 1.35 mg/dL   Calcium 8.7 8.4 - 10.5 mg/dL   Total Protein 8.3 6.0 - 8.3 g/dL   Albumin 2.3 (L) 3.5 - 5.2 g/dL   AST 32 0 - 37 U/L    Comment: HEMOLYSIS AT THIS LEVEL MAY AFFECT RESULT   ALT 25 0 - 53 U/L    Comment: HEMOLYSIS AT THIS LEVEL MAY AFFECT RESULT   Alkaline Phosphatase 117 39 - 117 U/L   Total Bilirubin 0.4 0.3 - 1.2 mg/dL   GFR calc non Af Amer 68 (L) >90 mL/min   GFR calc Af Amer 79 (L) >90 mL/min    Comment: (NOTE) The eGFR has been calculated using the  CKD EPI equation. This calculation has not been validated in all clinical situations. eGFR's persistently <90 mL/min signify possible Chronic Kidney Disease.    Anion gap 15 5 - 15    Ct Foot Left Wo Contrast  03/20/2014   CLINICAL DATA:  Patient is status post Chopart amputation of the left foot 02/02/2014 for gangrene. Now with fever and chills.  EXAM: CT OF THE LEFT FOOT WITHOUT CONTRAST  TECHNIQUE: Multidetector CT imaging of the left foot was performed according to the standard protocol. Multiplanar CT image reconstructions were also generated.  COMPARISON:  Plain films left foot 01/24/2014 and 03/20/2014.  FINDINGS: As seen on plain films, the patient is status post amputation of the foot with only small remnants of the cuneiforms and cuboid identified. Soft tissue gas is identified within the stump of the foot extending to the midbody of the plantar surface of the calcaneus. A skin ulceration is seen along the  lateral margin of the stump. There appears to be a fluid collection measuring 3.0 cm craniocaudal by 1 cm long by approximately 3.4 cm transverse in the stump centered at the distal cuboid remnant.  There is bony destructive change in the remnant of the cuboid centrally and laterally highly suspicious for osteomyelitis. Multiple small bony fragments are seen in the soft tissues in the stump throughout. Subtle loss of cortical bone is also seen in the navicular distally.  IMPRESSION: Abnormal gas in the soft tissues of the foot after amputation. Gastric should have resolved 1 month after surgery.  Skin wound at the amputation site with a likely large abscess in the stump centered about the cuboid.  Bony destructive change best seen in the cuboid and navicular highly suspicious for osteomyelitis.   Electronically Signed   By: Inge Rise M.D.   On: 03/20/2014 21:01   Dg Chest Port 1 View  03/20/2014   CLINICAL DATA:  Fever and chest pain for 1 week.  EXAM: PORTABLE CHEST - 1 VIEW   COMPARISON:  04/06/2008  FINDINGS: Chronic elevation of the right hemidiaphragm. Mild volume loss at the right lung base. Heart size is stable. Trachea is midline. No acute bone abnormality. No focal lung disease.  IMPRESSION: No acute chest findings.  Chronic elevation of the right hemidiaphragm.   Electronically Signed   By: Markus Daft M.D.   On: 03/20/2014 16:23   Dg Foot Complete Left  03/20/2014   CLINICAL DATA:  History of partial foot amputation. Small wound where the small toe was. Low grade fever.  EXAM: LEFT FOOT - COMPLETE 3+ VIEW  COMPARISON:  None.  FINDINGS: Patient had amputation in the mid tarsal region. There are small bone fragments near the amputation site which are probably postsurgical. The cortical margin of the remaining tarsal bones at the amputation site are ill-defined particularly at the cuboid. Difficult to exclude any erosions or cortical destruction in this area. No gross abnormality in the hindfoot.  IMPRESSION: Post amputation in the mid tarsal region. There is lucency along the lateral amputation site involving the cuboid. Findings could be related to osteopenia. Difficult to exclude cortical destruction or osteomyelitis in this area. This could be further evaluated with MRI.   Electronically Signed   By: Markus Daft M.D.   On: 03/20/2014 15:19    Review of Systems  All other systems reviewed and are negative.  Blood pressure 137/61, pulse 89, temperature 99.1 F (37.3 C), temperature source Oral, resp. rate 17, height '5\' 9"'  (1.753 m), weight 94.7 kg (208 lb 12.4 oz), SpO2 100 %. Physical Exam On examination patient has no abscess no drainage no cellulitis to the left foot. He has 2 ischemic ulcers about 10 mm in diameter one medially and one laterally which show improved granulation tissue. Assessment/Plan: Assessment: Ischemic ulcerations 2 left foot with no signs of abscess cellulitis or drainage.  Plan: Patient should continue nitroglycerin patch change daily I  will write orders for this. Silvadene dressing changes daily I will follow-up in the office in 2 weeks.  DUDA,MARCUS V 03/21/2014, 6:26 AM

## 2014-03-21 NOTE — Progress Notes (Signed)
INITIAL NUTRITION ASSESSMENT  DOCUMENTATION CODES Per approved criteria  -Severe malnutrition of acute illness   INTERVENTION: 1.  General healthful diet; encourage intake of foods and beverages as able.  RD to follow and assess for nutritional adequacy.  2.  Supplements; Boost BID during admission.  3.  Meals/snacks; RD requests hs snack which will be added to diet order.   NUTRITION DIAGNOSIS: Increased nutrient needs related to healing as evidenced by patient with non-healing ulcer to foot.   Monitor:  1.  Food/Beverage; pt meeting >/=90% estimated needs with tolerance. 2.  Wt/wt change; monitor trends  Reason for Assessment: consult; wound healing  73 y.o. male  Admitting Dx: Infected wound  ASSESSMENT: Patient admitted with infected wound. Patient with recent hospitalization for non-healing foot ulcer ultimately requiring partial food amputation.  Patient now presents with drainage from foot wound with dehiscence.  Discussed the importance of glucose control on wound healing.   PO intake 50% per patient.  Patient with recent weight change from 225 lbs to 208 lbs.  Pt attributes to recent surgery and antibiotic use stating that for several weeks while taking antibiotics he could not eat well and then lost his appetite again this week with his current illness.    Patient states he typically drinks Boost at home and would like to continue here. Discussed ways to improve intake.   Pt meets criteria for severe MALNUTRITION in the context of acute illness as evidenced by >5% weight loss in 1 month with intake insufficient to meet >75% estimated needs for >1 month.  Height: Ht Readings from Last 1 Encounters:  03/20/14 5\' 9"  (1.753 m)    Weight: Wt Readings from Last 1 Encounters:  03/20/14 208 lb 12.4 oz (94.7 kg)    Ideal Body Weight: 72.3 kg  % Ideal Body Weight: 130%  Wt Readings from Last 10 Encounters:  03/20/14 208 lb 12.4 oz (94.7 kg)  02/17/14 209 lb  (94.802 kg)  02/06/14 229 lb 4.5 oz (104 kg)  04/27/13 227 lb 6.4 oz (103.148 kg)  03/02/13 226 lb (102.513 kg)  02/05/13 226 lb 12.8 oz (102.876 kg)  01/09/11 226 lb (102.513 kg)  12/24/10 223 lb (101.152 kg)  12/19/10 224 lb (101.606 kg)  04/06/10 227 lb (102.967 kg)    Usual Body Weight: 225 lbs  % Usual Body Weight: 92%  BMI:  Body mass index is 30.82 kg/(m^2).  Estimated Nutritional Needs: Kcal: 2100-2200 Protein: 95-110g Fluid: >2.0 L/day  Skin: ulcer to foot  Diet Order: Diet heart healthy/carb modified  EDUCATION NEEDS: -Education needs addressed   Intake/Output Summary (Last 24 hours) at 03/21/14 1403 Last data filed at 03/21/14 1300  Gross per 24 hour  Intake 1578.33 ml  Output    200 ml  Net 1378.33 ml    Last BM: 12/19  Labs:   Recent Labs Lab 03/20/14 1320 03/21/14 0510  NA 131* 131*  K 4.6 4.6  CL 91* 93*  CO2 26 23  BUN 11 11  CREATININE 1.10 1.05  CALCIUM 9.1 8.7  GLUCOSE 401* 325*    CBG (last 3)   Recent Labs  03/20/14 2128 03/21/14 0749 03/21/14 1156  GLUCAP 287* 343* 357*    Scheduled Meds: . amLODipine  10 mg Oral Daily  . aspirin  81 mg Oral Daily  . cefTRIAXone (ROCEPHIN)  IV  2 g Intravenous Q24H   And  . metroNIDAZOLE  500 mg Oral 3 times per day  . docusate sodium  100  mg Oral BID  . feeding supplement (GLUCERNA SHAKE)  237 mL Oral BID BM  . heparin  5,000 Units Subcutaneous 3 times per day  . insulin aspart  0-9 Units Subcutaneous TID WC  . insulin glargine  20 Units Subcutaneous Daily  . isosorbide mononitrate  30 mg Oral Daily  . losartan  100 mg Oral Daily  . metoprolol succinate  100 mg Oral BID  . nitroGLYCERIN  0.2 mg Transdermal Daily  . rosuvastatin  40 mg Oral QPM  . silver sulfADIAZINE  1 application Topical Daily  . vancomycin  1,250 mg Intravenous Q12H    Continuous Infusions: . sodium chloride 50 mL/hr at 03/21/14 0500    Past Medical History  Diagnosis Date  . CAD (coronary artery  disease)     a. s/p IMI in past tx with POBA and cath 6 mos later with occluded RCA;  b. h/o Taxus DES to LAD and CFX;  c.  cath 1/10: LM ok, mLAD 40-50%, LAD stent ok, D2 tandem 60-70%, pCFX 30%, mAVCFX stent ok, pRCA 40%, RV 90%, RV marginal 90%, dRCA filled L-R collats tx medically  . Carotid stenosis     dopplers 5/11: 40-59% bilat  . DM2 (diabetes mellitus, type 2)   . HTN (hypertension)   . HLD (hyperlipidemia)   . Diabetes mellitus     Past Surgical History  Procedure Laterality Date  . Cardiac catheterization    . Femoral-tibial bypass graft Left 01/28/2014    Procedure:  FEMORAL-ANTERIOR TIBIAL ARTERY Bypass Graft utilizing composite gortex graft and vein graft;  Surgeon: Chuck Hinthristopher S Dickson, MD;  [facility];  Service: Vascular;  Laterality: Left;  . Amputation Left 01/28/2014    Procedure: AMPUTATION DIGIT- LEFT 5TH TOE;  Surgeon: Chuck Hinthristopher S Dickson, MD;  [facility];  Service: Vascular;  Laterality: Left;  . Amputation Left 02/02/2014    Procedure: Left 4th Ray Amputation vs Transmetatarsal Amputation;  Surgeon: Nadara MustardMarcus Duda V, MD;  [facility];  Service: Orthopedics;  Laterality: Left;  . Abdominal aortagram N/A 04/07/2013    Procedure: ABDOMINAL Ronny FlurryAORTAGRAM;  Surgeon: Iran OuchMuhammad A Arida, MD;  [facility];  Service: Cardiovascular;  Laterality: N/A;  . Lower extremity angiogram Left 01/26/2014    Procedure: LOWER EXTREMITY ANGIOGRAM;  Surgeon: Iran OuchMuhammad A Arida, MD;  Location: [facility] CATH LAB;  Service: Cardiovascular;  Laterality: Left;  . Abdominal aortagram N/A 01/26/2014    Procedure: ABDOMINAL AORTAGRAM;  Surgeon: Iran OuchMuhammad A Arida, MD;  [facility];  Service: Cardiovascular;  Laterality: N/A;    Loyce DysKacie Haniyyah Sakuma, MS RD LDN Clinical Inpatient Dietitian Weekend/After hours pager: [phone]

## 2014-03-21 NOTE — Progress Notes (Signed)
PROGRESS NOTE  Jordan [name] RUE:[id number] DOB: 19-Aug-1940 DOA: 03/20/2014 PCP: [name], MD  HPI/Recap of past 41 hours: 73 year old male with past history of poorly controlled diabetes mellitus with secondary peripheral vascular disease and CAD status post left arterial bypass in early November followed by partial foot amputation who has been following with vascular and orthopedic surgery and then presented to the emergency room after 1 week of overall malaise and 2 days of fevers, chills and elevated blood sugars as well as some wound dehiscence. Patient presented to the emergency room on 12/20 and noted to be afebrile with no leukocytosis, but foot x-ray noted area of lucency and possible osteomyelitis. Patient's admitted to the hospitalist service, started on broad-spectrum antibiotics.   Patient seen by orthopedist surgery who felt like the findings on x-ray were more related to chronic findings with no osteomyelitis. No abscess, cellulitis or drainage was noted. They felt the ulcerations were ischemic in patients be continued on nitroglycerin patch plus daily dressings. However this afternoon, patient's blood cultures have come back positive for 4/4 bottles of gram-positive cocci in clusters. His white blood cell count was noted elevated this morning at 12. Patient himself is feels overall very weak. No chest pain or shortness of breath. Does combine has some right shoulder pain, but feels like this is from when he slept on it wrong the other day   Assessment/Plan: Principal Problem:   SIRS (systemic inflammatory response syndrome) in the setting of with secondary diabetic foot ulcer and history of recent midfoot amputation: Continue vancomycin. Noted possibility of osteomyelitis could be underlying cause. We'll discuss with orthopedic surgery. Awaiting further cultures and sensitivities Active Problems:   DM (diabetes mellitus), type 2, uncontrolled, periph vascular complic: A1c  elevated, however sugars are disproportionate to this and likely in the context of acute illness from bacteremia and possibly osteomyelitis underlying: Have increased basal Lantus   Hyperlipidemia: Continue statin   Essential hypertension: Blood pressures overall stable    Protein-calorie malnutrition, severe: Patient meets criteria for severe malnutrition of acute illness. Seen by nutrition. Recommending boost twice a day during hospitalization   Code Status: Full code   Family Communication: Wife at the bedside   Disposition Plan: Continue in-hospital, waiting for cultures   Consultants:  Orthopedic surgery   Procedures:  None   Antibiotics:  IV Rocephin 12/20-12/21  IV vancomycin 12/20-present  IV Cipro 12/20-present    Objective: BP 135/60 mmHg  Pulse 88  Temp(Src) 98.6 F (37 C) (Oral)  Resp 16  Ht 5\' 9"  (1.753 m)  Wt 94.7 kg (208 lb 12.4 oz)  BMI 30.82 kg/m2  SpO2 100%  Intake/Output Summary (Last 24 hours) at 03/21/14 1757 Last data filed at 03/21/14 1744  Gross per 24 hour  Intake 2058.33 ml  Output    200 ml  Net 1858.33 ml   Filed Weights   03/20/14 1316 03/20/14 1702  Weight: 91.173 kg (201 lb) 94.7 kg (208 lb 12.4 oz)    Exam:   General:  *Alert and oriented 3, no acute distress  Cardiovascular: Regular rate and rhythm, S1-S2, 2/6 systolic ejection murmur  Respiratory: Clear to auscultation bilaterally  Abdomen: Soft, nontender, nondistended, positive bowel sounds   Musculoskeletal: Left foot wrapped, status post midfoot and dictation   Data Reviewed: Basic Metabolic Panel:  Recent Labs Lab 03/20/14 1320 03/21/14 0510  NA 131* 131*  K 4.6 4.6  CL 91* 93*  CO2 26 23  GLUCOSE 401* 325*  BUN 11 11  CREATININE  1.10 1.05  CALCIUM 9.1 8.7   Liver Function Tests:  Recent Labs Lab 03/20/14 1839 03/21/14 0510  AST  --  32  ALT  --  25  ALKPHOS  --  117  BILITOT  --  0.4  PROT  --  8.3  ALBUMIN 2.4* 2.3*   No results  for input(s): LIPASE, AMYLASE in the last 168 hours. No results for input(s): AMMONIA in the last 168 hours. CBC:  Recent Labs Lab 03/20/14 1320 03/21/14 0510  WBC 11.5* 12.0*  NEUTROABS 8.5*  --   HGB 9.4* 9.7*  HCT 29.9* 29.9*  MCV 80.4 81.3  PLT 299 384   Cardiac Enzymes:    Recent Labs Lab 03/20/14 1320  TROPONINI <0.30   BNP (last 3 results) No results for input(s): PROBNP in the last 8760 hours. CBG:  Recent Labs Lab 03/20/14 1818 03/20/14 2128 03/21/14 0749 03/21/14 1156 03/21/14 1742  GLUCAP 377* 287* 343* 357* 316*    Recent Results (from the past 240 hour(s))  Culture, blood (routine x 2)     Status: None (Preliminary result)   Collection Time: 03/20/14  1:30 PM  Result Value Ref Range Status   Specimen Description BLOOD RIGHT WRIST  Final   Special Requests BOTTLES DRAWN AEROBIC AND ANAEROBIC 5CCS  Final   Culture  Setup Time   Final    03/20/2014 23:45 Performed at [facility]    Culture   Final    GRAM POSITIVE COCCI IN CLUSTERS Note: Gram Stain Report Called to,Read Back By and Verified With: CAROL HUCKABEE 03/21/14 1025 BY SMITHERSJ Performed at [facility]    Report Status PENDING  Incomplete  Culture, blood (routine x 2)     Status: None (Preliminary result)   Collection Time: 03/20/14  2:11 PM  Result Value Ref Range Status   Specimen Description BLOOD LEFT ANTECUBITAL  Final   Special Requests BOTTLES DRAWN AEROBIC AND ANAEROBIC 3CCS  Final   Culture  Setup Time   Final    03/20/2014 23:45 Performed at [facility]    Culture   Final    GRAM POSITIVE COCCI IN CLUSTERS Note: Gram Stain Report Called to,Read Back By and Verified With: CAROL HUCKABEE 03/21/14 1025 BY SMITHERSJ Performed at [facility]    Report Status PENDING  Incomplete  Urine culture     Status: None   Collection Time: 03/20/14  3:49 PM  Result Value Ref Range Status   Specimen Description URINE, CLEAN CATCH  Final   Special  Requests Normal  Final   Culture  Setup Time   Final    03/20/2014 21:30 Performed at [facility]    Colony Count NO GROWTH Performed at [facility]   Final   Culture NO GROWTH Performed at [facility]   Final   Report Status 03/21/2014 FINAL  Final     Studies: Ct Foot Left Wo Contrast  03/20/2014   CLINICAL DATA:  Patient is status post [place] amputation of the left foot 02/02/2014 for gangrene. Now with fever and chills.  EXAM: CT OF THE LEFT FOOT WITHOUT CONTRAST  TECHNIQUE: Multidetector CT imaging of the left foot was performed according to the standard protocol. Multiplanar CT image reconstructions were also generated.  COMPARISON:  Plain films left foot 01/24/2014 and 03/20/2014.  FINDINGS: As seen on plain films, the patient is status post amputation of the foot with only small remnants of the cuneiforms and cuboid identified. Soft  tissue gas is identified within the stump of the foot extending to the midbody of the plantar surface of the calcaneus. A skin ulceration is seen along the lateral margin of the stump. There appears to be a fluid collection measuring 3.0 cm craniocaudal by 1 cm long by approximately 3.4 cm transverse in the stump centered at the distal cuboid remnant.  There is bony destructive change in the remnant of the cuboid centrally and laterally highly suspicious for osteomyelitis. Multiple small bony fragments are seen in the soft tissues in the stump throughout. Subtle loss of cortical bone is also seen in the navicular distally.  IMPRESSION: Abnormal gas in the soft tissues of the foot after amputation. Gastric should have resolved 1 month after surgery.  Skin wound at the amputation site with a likely large abscess in the stump centered about the cuboid.  Bony destructive change best seen in the cuboid and navicular highly suspicious for osteomyelitis.   Electronically Signed   By: Drusilla Kanner M.D.   On: 03/20/2014 21:01   Dg Chest  Port 1 View  03/20/2014   CLINICAL DATA:  Fever and chest pain for 1 week.  EXAM: PORTABLE CHEST - 1 VIEW  COMPARISON:  04/06/2008  FINDINGS: Chronic elevation of the right hemidiaphragm. Mild volume loss at the right lung base. Heart size is stable. Trachea is midline. No acute bone abnormality. No focal lung disease.  IMPRESSION: No acute chest findings.  Chronic elevation of the right hemidiaphragm.   Electronically Signed   By: Richarda Overlie M.D.   On: 03/20/2014 16:23   Dg Foot Complete Left  03/20/2014   CLINICAL DATA:  History of partial foot amputation. Small wound where the small toe was. Low grade fever.  EXAM: LEFT FOOT - COMPLETE 3+ VIEW  COMPARISON:  None.  FINDINGS: Patient had amputation in the mid tarsal region. There are small bone fragments near the amputation site which are probably postsurgical. The cortical margin of the remaining tarsal bones at the amputation site are ill-defined particularly at the cuboid. Difficult to exclude any erosions or cortical destruction in this area. No gross abnormality in the hindfoot.  IMPRESSION: Post amputation in the mid tarsal region. There is lucency along the lateral amputation site involving the cuboid. Findings could be related to osteopenia. Difficult to exclude cortical destruction or osteomyelitis in this area. This could be further evaluated with MRI.   Electronically Signed   By: Richarda Overlie M.D.   On: 03/20/2014 15:19    Scheduled Meds: . amLODipine  10 mg Oral Daily  . aspirin  81 mg Oral Daily  . docusate sodium  100 mg Oral BID  . feeding supplement (GLUCERNA SHAKE)  237 mL Oral BID BM  . heparin  5,000 Units Subcutaneous 3 times per day  . insulin aspart  0-9 Units Subcutaneous TID WC  . [START ON 03/22/2014] insulin glargine  28 Units Subcutaneous Daily  . isosorbide mononitrate  30 mg Oral Daily  . lactose free nutrition  237 mL Oral BID WC  . losartan  100 mg Oral Daily  . metoprolol succinate  100 mg Oral BID  .  nitroGLYCERIN  0.2 mg Transdermal Daily  . rosuvastatin  40 mg Oral QPM  . silver sulfADIAZINE  1 application Topical Daily  . vancomycin  1,250 mg Intravenous Q12H    Continuous Infusions:    Time spent: 25 minutes   Hollice Espy  [facility] Pager [phone] . If 7PM-7AM, please contact night-coverage  at [email or web address], password [email or web address] 03/21/2014, 5:57 PM  LOS: 1 day

## 2014-03-21 NOTE — Progress Notes (Signed)
Inpatient Diabetes Program Recommendations  AACE/ADA: New Consensus Statement on Inpatient Glycemic Control (2013)  Target Ranges:  Prepandial:   less than 140 mg/dL      Peak postprandial:   less than 180 mg/dL (1-2 hours)      Critically ill patients:  140 - 180 mg/dL     Results for Jordan [name], Jordan [name] (MRN [id number]) as of 03/21/2014 11:31  Ref. Range 03/20/2014 18:18 03/20/2014 21:28  Glucose-Capillary Latest Range: 70-99 mg/dL 409377 (H) 811287 (H)    Results for Jordan [name], Jordan [name] (MRN [id number]) as of 03/21/2014 11:31  Ref. Range 03/21/2014 07:49  Glucose-Capillary Latest Range: 70-99 mg/dL 213343 (H)    Results for Jordan [name], Jordan [name] (MRN [id number]) as of 03/21/2014 11:31  Ref. Range 01/24/2014 05:35 03/20/2014 18:39  Hgb A1c MFr Bld Latest Range: <5.7 % 8.9 (H) 8.4 (H)     Admitted with Diabetic Foot Ulcer.  History of DM, CAD, HTN.   Home DM Meds (per pt and wife report):  Lantus 25 units QAM             Lantus 20 units QHS (prn)             Novolog 2-10 units tid per SSI   Current Insulin Orders: Lantus 20 units QAM        Novolog Sensitive SSI   **Spoke with patient and his wife this AM.  Per pt and wife, patient takes Lantus 25 units QAM and will occasionally take Lantus 20 units QHS (has not taken bedtime dose in many days).  Sees Dr. Mikey BussingHoffman and Tommas OlpPeggy Brewer, NP with [facility] for DM management.  **Spoke to patient about his current A1c of  8.4%.  Reminded patient that his goal A1c is 7% or less per ADA standards to prevent both acute and long-term complications.  Explained to patient the extreme importance of good glucose control at home.  Congratulated patient for his slight improvement in his A1c since October (A1c was 8.9%).  Currently patient is checking CBGs 2-3 times per day.  Encourage patient to continue this practice.  Per wife, they are keeping a detailed log book of all his insulin doses and CBG readings.   MD- Patient still not eating much yet.  Glucose levels  quite elevated.  Note patient received 20 units Lantus last PM at 9PM and also received 20 units Lantus this AM.  Patient can potentially take up to 45 units Lantus (split between AM and PM doses) at home.   MD- Please consider the following:  1. Start Lantus 20 units QHS 2. Continue Lantus 20 units QAM 3. Increase SSI to Moderate scale    Will follow Ambrose FinlandJeannine Johnston Shalina Norfolk RN, MSN, CDE Diabetes Coordinator Inpatient Diabetes Program Team Pager: [phone] (8a-10p)

## 2014-03-21 NOTE — Consult Note (Signed)
Pt is managed by Dr. Lajoyce Cornersuda and has been evaluated this am with wound care orders noted in consultation notes from Dr. Lajoyce Cornersuda.  For this reason WOC will not consult on this patient.  Discussed POC with patient and bedside nurse.  Re consult if needed, will not follow at this time. Thanks  Harika Laidlaw [employer] RN, CWOCN [phone])

## 2014-03-22 ENCOUNTER — Inpatient Hospital Stay (HOSPITAL_COMMUNITY): Payer: Medicare PPO

## 2014-03-22 DIAGNOSIS — M868X7 Other osteomyelitis, ankle and foot: Secondary | ICD-10-CM

## 2014-03-22 DIAGNOSIS — B9561 Methicillin susceptible Staphylococcus aureus infection as the cause of diseases classified elsewhere: Secondary | ICD-10-CM

## 2014-03-22 DIAGNOSIS — M86172 Other acute osteomyelitis, left ankle and foot: Secondary | ICD-10-CM

## 2014-03-22 LAB — GLUCOSE, CAPILLARY
Glucose-Capillary: 339 mg/dL — ABNORMAL HIGH (ref 70–99)
Glucose-Capillary: 477 mg/dL — ABNORMAL HIGH (ref 70–99)
Glucose-Capillary: 336 mg/dL — ABNORMAL HIGH (ref 70–99)
Glucose-Capillary: 420 mg/dL — ABNORMAL HIGH (ref 70–99)

## 2014-03-22 LAB — CBC
HCT: 28.5 % — ABNORMAL LOW (ref 39.0–52.0)
Hemoglobin: 8.8 g/dL — ABNORMAL LOW (ref 13.0–17.0)
MCH: 24.6 pg — ABNORMAL LOW (ref 26.0–34.0)
MCHC: 30.9 g/dL (ref 30.0–36.0)
MCV: 79.6 fL (ref 78.0–100.0)
Platelets: 403 10*3/uL — ABNORMAL HIGH (ref 150–400)
RBC: 3.58 MIL/uL — ABNORMAL LOW (ref 4.22–5.81)
RDW: 16.4 % — ABNORMAL HIGH (ref 11.5–15.5)
WBC: 10.5 10*3/uL (ref 4.0–10.5)

## 2014-03-22 LAB — BASIC METABOLIC PANEL
Anion gap: 8 (ref 5–15)
BUN: 11 mg/dL (ref 6–23)
CO2: 25 mmol/L (ref 19–32)
Calcium: 8.3 mg/dL — ABNORMAL LOW (ref 8.4–10.5)
Chloride: 97 mEq/L (ref 96–112)
Creatinine, Ser: 1.17 mg/dL (ref 0.50–1.35)
GFR calc Af Amer: 70 mL/min — ABNORMAL LOW (ref >90)
GFR calc non Af Amer: 60 mL/min — ABNORMAL LOW (ref >90)
Glucose, Bld: 309 mg/dL — ABNORMAL HIGH (ref 70–99)
Potassium: 3.7 mmol/L (ref 3.5–5.1)
Sodium: 130 mmol/L — ABNORMAL LOW (ref 135–145)

## 2014-03-22 LAB — VANCOMYCIN, TROUGH: Vancomycin Tr: 35.7 ug/mL (ref 10.0–20.0)

## 2014-03-22 MED ORDER — INSULIN ASPART 100 UNIT/ML ~~LOC~~ SOLN
6.0000 [IU] | Freq: Once | SUBCUTANEOUS | Status: AC
  Administered 2014-03-22: 6 [IU] via SUBCUTANEOUS

## 2014-03-22 NOTE — Progress Notes (Signed)
PROGRESS NOTE  Jordan [name] OZH:[id number] DOB: 1941-01-04 DOA: 03/20/2014 PCP: Lindwood QuaHOFFMAN,BYRON, MD  HPI/Recap of past 824 hours: 73 year old male with past history of poorly controlled diabetes mellitus with secondary peripheral vascular disease and CAD status post left arterial bypass in early November followed by partial foot amputation who has been following with vascular and orthopedic surgery and then presented to the emergency room after 1 week of overall malaise and 2 days of fevers, chills and elevated blood sugars as well as some wound dehiscence. Patient presented to the emergency room on 12/20 and noted to be afebrile with no leukocytosis, but foot x-ray noted area of lucency and possible osteomyelitis. Patient's admitted to the hospitalist service, started on broad-spectrum antibiotics.   Patient seen by orthopedist surgery who felt like the findings on x-ray were more related to chronic findings with no osteomyelitis. No abscess, cellulitis or drainage was noted. They felt the ulcerations were ischemic in patients be continued on nitroglycerin patch plus daily dressings. However this afternoon, patient's blood cultures have come back positive for 4/4 bottles of gram-positive cocci in clusters, which came back as staph aureus.  ID consulted & 2D echo ordered.  MRI of foot confirms abscess + osteomyelitis.  Pt himself complains of fatigue.  His left shoulder has been bothering him, which he feels is because he slept on it wrong.   Assessment/Plan: Principal Problem:   SIRS (systemic inflammatory response syndrome) in the setting of with secondary diabetic foot ulcer and history of recent midfoot amputation w/osteomyelitis: Continue vancomycin. Will d/w ortho.  Possible further amputation  Active Problems:   DM (diabetes mellitus), type 2, uncontrolled, periph vascular complic: A1c elevated, however sugars are disproportionate to this and likely in the context of acute illness from  bacteremia and possibly osteomyelitis underlying: Have increased basal Lantus.  Given abscess & OM, expected.     Hyperlipidemia: Continue statin   Essential hypertension: Blood pressures overall stable    Protein-calorie malnutrition, severe: Patient meets criteria for severe malnutrition of acute illness. Seen by nutrition. Recommending boost twice a day during hospitalization  Shoulder pain: xrays done, no evidence of fracture or abnormal lucency  Code Status: Full code   Family Communication: Wife at the bedside   Disposition Plan: Continue in-hospital, waiting for Ortho f/u, echo   Consultants:  Orthopedic surgery   ID  Procedures:  None   Antibiotics:  IV Rocephin 12/20-12/21  IV vancomycin 12/20-present  IV Cipro 12/20-12/21   Objective: BP 135/65 mmHg  Pulse 89  Temp(Src) 98.9 F (37.2 C) (Oral)  Resp 20  Ht 5\' 9"  (1.753 m)  Wt 98.9 kg (218 lb 0.6 oz)  BMI 32.18 kg/m2  SpO2 98%  Intake/Output Summary (Last 24 hours) at 03/22/14 1733 Last data filed at 03/22/14 1320  Gross per 24 hour  Intake 3390.17 ml  Output    600 ml  Net 2790.17 ml   Filed Weights   03/20/14 1316 03/20/14 1702 03/22/14 0454  Weight: 91.173 kg (201 lb) 94.7 kg (208 lb 12.4 oz) 98.9 kg (218 lb 0.6 oz)    Exam:   General:  Alert and oriented 3, no acute distress  Cardiovascular: Regular rate and rhythm, S1-S2, 2/6 systolic ejection murmur  Respiratory: Clear to auscultation bilaterally  Abdomen: Soft, nontender, nondistended, positive bowel sounds   Musculoskeletal: Left foot wrapped, status post midfoot amputation  Right shoulder-limited ROM  Data Reviewed: Basic Metabolic Panel:  Recent Labs Lab 03/20/14 1320 03/21/14 0510 03/22/14 0430  NA 131* 131*  130*  K 4.6 4.6 3.7  CL 91* 93* 97  CO2 26 23 25   GLUCOSE 401* 325* 309*  BUN 11 11 11   CREATININE 1.10 1.05 1.17  CALCIUM 9.1 8.7 8.3*   Liver Function Tests:  Recent Labs Lab 03/20/14 1839  03/21/14 0510  AST  --  32  ALT  --  25  ALKPHOS  --  117  BILITOT  --  0.4  PROT  --  8.3  ALBUMIN 2.4* 2.3*   No results for input(s): LIPASE, AMYLASE in the last 168 hours. No results for input(s): AMMONIA in the last 168 hours. CBC:  Recent Labs Lab 03/20/14 1320 03/21/14 0510 03/22/14 0430  WBC 11.5* 12.0* 10.5  NEUTROABS 8.5*  --   --   HGB 9.4* 9.7* 8.8*  HCT 29.9* 29.9* 28.5*  MCV 80.4 81.3 79.6  PLT 299 384 403*   Cardiac Enzymes:    Recent Labs Lab 03/20/14 1320  TROPONINI <0.30   BNP (last 3 results) No results for input(s): PROBNP in the last 8760 hours. CBG:  Recent Labs Lab 03/21/14 1156 03/21/14 1742 03/21/14 2100 03/22/14 0741 03/22/14 1252  GLUCAP 357* 316* 333* 339* 477*    Recent Results (from the past 240 hour(s))  Culture, blood (routine x 2)     Status: None (Preliminary result)   Collection Time: 03/20/14  1:30 PM  Result Value Ref Range Status   Specimen Description BLOOD RIGHT WRIST  Final   Special Requests BOTTLES DRAWN AEROBIC AND ANAEROBIC 5CCS  Final   Culture  Setup Time   Final    03/20/2014 23:45 Performed at [facility]    Culture   Final    STAPHYLOCOCCUS AUREUS Note: RIFAMPIN AND GENTAMICIN SHOULD NOT BE USED AS SINGLE DRUGS FOR TREATMENT OF STAPH INFECTIONS. Note: Gram Stain Report Called to,Read Back By and Verified With: CAROL HUCKABEE 03/21/14 1025 BY SMITHERSJ Performed at [facility]    Report Status PENDING  Incomplete  Culture, blood (routine x 2)     Status: None (Preliminary result)   Collection Time: 03/20/14  2:11 PM  Result Value Ref Range Status   Specimen Description BLOOD LEFT ANTECUBITAL  Final   Special Requests BOTTLES DRAWN AEROBIC AND ANAEROBIC 3CCS  Final   Culture  Setup Time   Final    03/20/2014 23:45 Performed at [facility]    Culture   Final    STAPHYLOCOCCUS AUREUS Note: Gram Stain Report Called to,Read Back By and Verified With: CAROL HUCKABEE 03/21/14  1025 BY SMITHERSJ Culture results may be compromised due to an inadequate volume of blood received in culture bottles. Performed at [facility]    Report Status PENDING  Incomplete  Urine culture     Status: None   Collection Time: 03/20/14  3:49 PM  Result Value Ref Range Status   Specimen Description URINE, CLEAN CATCH  Final   Special Requests Normal  Final   Culture  Setup Time   Final    03/20/2014 21:30 Performed at [facility]    Colony Count NO GROWTH Performed at [facility]   Final   Culture NO GROWTH Performed at [facility]   Final   Report Status 03/21/2014 FINAL  Final     Studies: No results found.  Scheduled Meds: . amLODipine  10 mg Oral Daily  . aspirin  81 mg Oral Daily  . docusate sodium  100 mg Oral BID  . feeding  supplement (GLUCERNA SHAKE)  237 mL Oral BID BM  . heparin  5,000 Units Subcutaneous 3 times per day  . insulin aspart  0-15 Units Subcutaneous TID WC  . insulin glargine  28 Units Subcutaneous Daily  . isosorbide mononitrate  30 mg Oral Daily  . lactose free nutrition  237 mL Oral BID WC  . losartan  100 mg Oral Daily  . metoprolol succinate  100 mg Oral BID  . nitroGLYCERIN  0.2 mg Transdermal Daily  . rosuvastatin  40 mg Oral QPM  . silver sulfADIAZINE  1 application Topical Daily  . vancomycin  1,250 mg Intravenous Q12H    Continuous Infusions:    Time spent: 25 minutes   Hollice EspyKRISHNAN,SENDIL K  [facility] Pager [phone] . If 7PM-7AM, please contact night-coverage at [email or web address], password [email or web address] 03/22/2014, 5:33 PM  LOS: 2 days

## 2014-03-22 NOTE — Progress Notes (Signed)
CRITICAL VALUE ALERT  Critical value received:  Vancomycin level 35.7  Date of notification:  03/22/14  Time of notification: 17:30  Critical value read back: yes  Nurse who received alert:  Alferd ApaJoan Hendel Gatliff  MD notified (1st page):n/a notified pharmacy  Time of first page:    MD notified (2nd page):  Time of second page:  Responding MD: n/a  Time MD responded: n/a

## 2014-03-22 NOTE — Clinical Documentation Improvement (Signed)
Possible Clinical Conditions? Severe Malnutrition   Protein Calorie Malnutrition Severe Protein Calorie Malnutrition Other Condition Cannot clinically determine  Supporting Information: (As per INITIAL NUTRITION ASSESSMENT done by Ashley JacobsKacie S Matthews, RD at 03/21/2014 DOCUMENTATION CODES  Per approved criteria   -Severe malnutrition of acute illness   INTERVENTION:  1. General healthful diet; encourage intake of foods and beverages as able. RD to follow and assess for nutritional adequacy. 2. Supplements; Boost BID during admission. 3. Meals/snacks; RD requests hs snack which will be added to diet order.  NUTRITION DIAGNOSIS:  Increased nutrient needs related to healing as evidenced by patient with non-healing ulcer to foot. Monitor:  1. Food/Beverage; pt meeting >/=90% estimated needs with tolerance.  2. Wt/wt change; monitor trends   Thank You, Nevin BloodgoodJoan B Hazell Siwik, RN, BSN, CCDS,Clinical Documentation Specialist:  [phone]  [phone]=[employer]- Health Information Management

## 2014-03-22 NOTE — Consult Note (Addendum)
[facility] for Infectious Disease     Reason for Consult: Staph aureus bacteremia    Referring Physician: Dr. Tania Ade  Principal Problem:   SIRS (systemic inflammatory response syndrome) Active Problems:   DM (diabetes mellitus), type 2, uncontrolled, periph vascular complic   Hyperlipidemia   Essential hypertension   Coronary atherosclerosis   PAD (peripheral artery disease)   Diabetic foot ulcer   Infected wound   Protein-calorie malnutrition, severe   . amLODipine  10 mg Oral Daily  . aspirin  81 mg Oral Daily  . docusate sodium  100 mg Oral BID  . feeding supplement (GLUCERNA SHAKE)  237 mL Oral BID BM  . heparin  5,000 Units Subcutaneous 3 times per day  . insulin aspart  0-15 Units Subcutaneous TID WC  . insulin glargine  28 Units Subcutaneous Daily  . isosorbide mononitrate  30 mg Oral Daily  . lactose free nutrition  237 mL Oral BID WC  . losartan  100 mg Oral Daily  . metoprolol succinate  100 mg Oral BID  . nitroGLYCERIN  0.2 mg Transdermal Daily  . rosuvastatin  40 mg Oral QPM  . silver sulfADIAZINE  1 application Topical Daily  . vancomycin  1,250 mg Intravenous Q12H    Recommendations: TTE  repeat blood cultures to assure clearance imaging of shoulder and spine, ? infection Consider further input from orthopedics based on positive blood cultures, CT of foot with fluid collection, 'likely large abscess'.    Assessment: He has Staph aureus osteomyelitis of foot.  CRP 30, ESR 135.   Antibiotics: Vancomycin day 3  HPI: Jordan [name] is a 73 y.o. male with diabetes, CAD, PVD, s/p femoral artery to anterior tibial artery bypass with partial foot amputation 11/4 who came in 12/20 with malaise, fever, chills.  WBC 11.5, afebrile.  Had blood cultures done that now are 2/2 with Staph aureus.  Feels better since discharge.  CT scan of foot with abnormal gas, likely large abscess, bony destruction concerning for infection.     Review of Systems: A  comprehensive review of systems was negative.  Past Medical History  Diagnosis Date  . CAD (coronary artery disease)     a. s/p IMI in past tx with POBA and cath 6 mos later with occluded RCA;  b. h/o Taxus DES to LAD and CFX;  c.  cath 1/10: LM ok, mLAD 40-50%, LAD stent ok, D2 tandem 60-70%, pCFX 30%, mAVCFX stent ok, pRCA 40%, RV 90%, RV marginal 90%, dRCA filled L-R collats tx medically  . Carotid stenosis     dopplers 5/11: 40-59% bilat  . DM2 (diabetes mellitus, type 2)   . HTN (hypertension)   . HLD (hyperlipidemia)   . Diabetes mellitus     History  Substance Use Topics  . Smoking status: Former Smoker    Types: Pipe    Quit date: 02/18/2011  . Smokeless tobacco: Never Used  . Alcohol Use: No    Family History  Problem Relation Age of Onset  . Diabetes Mother   . Diabetes Sister    Allergies  Allergen Reactions  . Cilostazol Other (See Comments)    Feels like feet on fire    OBJECTIVE: Blood pressure 143/59, pulse 87, temperature 99 F (37.2 C), temperature source Oral, resp. rate 18, height '5\' 9"'  (1.753 m), weight 218 lb 0.6 oz (98.9 kg), SpO2 95 %. General: awake, alert, nad Skin: no rashes Lungs: CTA B Cor: RRR with 3/6 SEM Abdomen:  soft, nt, nd Ext: wrapped  Microbiology: Recent Results (from the past 240 hour(s))  Culture, blood (routine x 2)     Status: None (Preliminary result)   Collection Time: 03/20/14  1:30 PM  Result Value Ref Range Status   Specimen Description BLOOD RIGHT WRIST  Final   Special Requests BOTTLES DRAWN AEROBIC AND ANAEROBIC 5CCS  Final   Culture  Setup Time   Final    03/20/2014 23:45 Performed at [facility]    Culture   Final    STAPHYLOCOCCUS AUREUS Note: RIFAMPIN AND GENTAMICIN SHOULD NOT BE USED AS SINGLE DRUGS FOR TREATMENT OF STAPH INFECTIONS. Note: Gram Stain Report Called to,Read Back By and Verified With: CAROL HUCKABEE 03/21/14 1025 BY SMITHERSJ Performed at [facility]    Report Status  PENDING  Incomplete  Culture, blood (routine x 2)     Status: None (Preliminary result)   Collection Time: 03/20/14  2:11 PM  Result Value Ref Range Status   Specimen Description BLOOD LEFT ANTECUBITAL  Final   Special Requests BOTTLES DRAWN AEROBIC AND ANAEROBIC 3CCS  Final   Culture  Setup Time   Final    03/20/2014 23:45 Performed at [facility]    Culture   Final    STAPHYLOCOCCUS AUREUS Note: Gram Stain Report Called to,Read Back By and Verified With: CAROL HUCKABEE 03/21/14 1025 BY SMITHERSJ Culture results may be compromised due to an inadequate volume of blood received in culture bottles. Performed at [facility]    Report Status PENDING  Incomplete  Urine culture     Status: None   Collection Time: 03/20/14  3:49 PM  Result Value Ref Range Status   Specimen Description URINE, CLEAN CATCH  Final   Special Requests Normal  Final   Culture  Setup Time   Final    03/20/2014 21:30 Performed at [place] Performed at [facility]   Final   Culture NO GROWTH Performed at [facility]   Final   Report Status 03/21/2014 FINAL  Final    Scharlene Gloss, [place] for Infectious Disease [facility] [email or web address] [phone] pager  [phone] cell 03/22/2014, 12:04 PM

## 2014-03-22 NOTE — Progress Notes (Signed)
ANTIBIOTIC CONSULT NOTE - FOLLOW UP  Pharmacy Consult for vancomycin Indication: osteomyelitis  Allergies  Allergen Reactions  . Cilostazol Other (See Comments)    Feels like feet on fire    Patient Measurements: Height: 5\' 9"  (175.3 cm) Weight: 218 lb 0.6 oz (98.9 kg) IBW/kg (Calculated) : 70.7  Vital Signs: Temp: 98.9 F (37.2 C) (12/22 1400) Temp Source: Oral (12/22 1400) BP: 135/65 mmHg (12/22 1400) Pulse Rate: 89 (12/22 1400) Intake/Output from previous day: 12/21 0701 - 12/22 0700 In: 2910.2 [P.O.:1350; I.V.:859.2; IV Piggyback:250] Out: 600 [Urine:600] Intake/Output from this shift: Total I/O In: 960 [P.O.:960] Out: -   Labs:  Recent Labs  03/20/14 1320 03/21/14 0510 03/22/14 0430  WBC 11.5* 12.0* 10.5  HGB 9.4* 9.7* 8.8*  PLT 299 384 403*  CREATININE 1.10 1.05 1.17   Estimated Creatinine Clearance: 65.2 mL/min (by C-G formula based on Cr of 1.17).  Recent Labs  03/22/14 1623  VANCOTROUGH 35.7*     Microbiology: Recent Results (from the past 720 hour(s))  Culture, blood (routine x 2)     Status: None (Preliminary result)   Collection Time: 03/20/14  1:30 PM  Result Value Ref Range Status   Specimen Description BLOOD RIGHT WRIST  Final   Special Requests BOTTLES DRAWN AEROBIC AND ANAEROBIC 5CCS  Final   Culture  Setup Time   Final    03/20/2014 23:45 [facility]    Culture   Final    STAPHYLOCOCCUS AUREUS Note: RIFAMPIN AND GENTAMICIN SHOULD NOT BE USED AS SINGLE DRUGS FOR TREATMENT OF STAPH INFECTIONS. Note: Gram Stain Report Called to,Read Back By and Verified With: CAROL HUCKABEE 03/21/14 1025 BY SMITHERSJ [facility]    Report Status PENDING  Incomplete  Culture, blood (routine x 2)     Status: None (Preliminary result)   Collection Time: 03/20/14  2:11 PM  Result Value Ref Range Status   Specimen Description BLOOD LEFT ANTECUBITAL  Final   Special Requests BOTTLES DRAWN AEROBIC AND  ANAEROBIC 3CCS  Final   Culture  Setup Time   Final    03/20/2014 23:45 [facility]    Culture   Final    STAPHYLOCOCCUS AUREUS Note: Gram Stain Report Called to,Read Back By and Verified With: CAROL HUCKABEE 03/21/14 1025 BY SMITHERSJ Culture results may be compromised due to an inadequate volume of blood received in culture bottles. [facility]    Report Status PENDING  Incomplete  Urine culture     Status: None   Collection Time: 03/20/14  3:49 PM  Result Value Ref Range Status   Specimen Description URINE, CLEAN CATCH  Final   Special Requests Normal  Final   Culture  Setup Time   Final    03/20/2014 21:30 [facility]    Colony Count NO GROWTH [facility]   Final   Culture NO GROWTH [facility]   Final   Report Status 03/21/2014 FINAL  Final    Anti-infectives    Start     Dose/Rate Route Frequency Ordered Stop   03/20/14 2200  cefTRIAXone (ROCEPHIN) 2 g in dextrose 5 % 50 mL IVPB - Premix  Status:  Discontinued     2 g100 mL/hr over 30 Minutes Intravenous Every 24 hours 03/20/14 1714 03/21/14 1754   03/20/14 2200  metroNIDAZOLE (FLAGYL) tablet 500 mg  Status:  Discontinued     500 mg  Oral 3 times per day 03/20/14 1714 03/21/14 1754   03/20/14 1700  vancomycin (VANCOCIN) 1,250 mg in sodium chloride 0.9 % 250 mL IVPB     1,250 mg166.7 mL/hr over 90 Minutes Intravenous Every 12 hours 03/20/14 1554     03/20/14 1600  metroNIDAZOLE (FLAGYL) IVPB 500 mg     500 mg100 mL/hr over 60 Minutes Intravenous  Once 03/20/14 1548 03/20/14 1707   03/20/14 1600  ceFEPIme (MAXIPIME) 2 g in dextrose 5 % 50 mL IVPB     2 g100 mL/hr over 30 Minutes Intravenous  Once 03/20/14 1548 03/20/14 1647      Assessment: 73 yo male with staph aureus osteo of the foot on vancomycin. A vancomycin trough today was 35.7 at about 4:30pm. WBC= 10.5, afeb, SCr= 1.17 and CrCL ~ 65. ID is also  following.  CTX 12/20>> 12/21 Flagyl po 12/20>>12/21  Vanc 12/20>>  12/20 blood- staph aureus 2/2 12/20 urine- neg  Goal of Therapy:  Vancomycin trough level 15-20 mcg/ml  Plan:   -Hold vancomycin for now -Will check a random vancomycin level in am to help determine vancomycin kinetics  Harland Germanndrew Zury Fazzino, Pharm D 03/22/2014 5:55 PM

## 2014-03-23 DIAGNOSIS — I5032 Chronic diastolic (congestive) heart failure: Secondary | ICD-10-CM | POA: Diagnosis present

## 2014-03-23 DIAGNOSIS — T798XXD Other early complications of trauma, subsequent encounter: Secondary | ICD-10-CM

## 2014-03-23 DIAGNOSIS — I359 Nonrheumatic aortic valve disorder, unspecified: Secondary | ICD-10-CM

## 2014-03-23 DIAGNOSIS — L02619 Cutaneous abscess of unspecified foot: Secondary | ICD-10-CM

## 2014-03-23 LAB — BRAIN NATRIURETIC PEPTIDE: B Natriuretic Peptide: 119.9 pg/mL — ABNORMAL HIGH (ref 0.0–100.0)

## 2014-03-23 LAB — CREATININE, SERUM
Creatinine, Ser: 1.28 mg/dL (ref 0.50–1.35)
GFR calc Af Amer: 62 mL/min — ABNORMAL LOW (ref >90)
GFR calc non Af Amer: 54 mL/min — ABNORMAL LOW (ref >90)

## 2014-03-23 LAB — GLUCOSE, CAPILLARY
Glucose-Capillary: 433 mg/dL — ABNORMAL HIGH (ref 70–99)
Glucose-Capillary: 349 mg/dL — ABNORMAL HIGH (ref 70–99)
Glucose-Capillary: 275 mg/dL — ABNORMAL HIGH (ref 70–99)
Glucose-Capillary: 264 mg/dL — ABNORMAL HIGH (ref 70–99)

## 2014-03-23 LAB — CULTURE, BLOOD (ROUTINE X 2)

## 2014-03-23 LAB — VANCOMYCIN, RANDOM: Vancomycin Rm: 23 ug/mL

## 2014-03-23 MED ORDER — VANCOMYCIN HCL IN DEXTROSE 750-5 MG/150ML-% IV SOLN
750.0000 mg | Freq: Two times a day (BID) | INTRAVENOUS | Status: DC
  Filled 2014-03-23: qty 150

## 2014-03-23 MED ORDER — INSULIN GLARGINE 100 UNIT/ML ~~LOC~~ SOLN
20.0000 [IU] | Freq: Every day | SUBCUTANEOUS | Status: DC
  Administered 2014-03-23: 20 [IU] via SUBCUTANEOUS
  Filled 2014-03-23 (×2): qty 0.2

## 2014-03-23 MED ORDER — INSULIN ASPART 100 UNIT/ML ~~LOC~~ SOLN
0.0000 [IU] | SUBCUTANEOUS | Status: DC
  Administered 2014-03-23: 8 [IU] via SUBCUTANEOUS
  Administered 2014-03-23: 11 [IU] via SUBCUTANEOUS
  Administered 2014-03-23: 8 [IU] via SUBCUTANEOUS
  Administered 2014-03-24 (×2): 5 [IU] via SUBCUTANEOUS
  Administered 2014-03-24: 8 [IU] via SUBCUTANEOUS
  Administered 2014-03-24: 15 [IU] via SUBCUTANEOUS
  Administered 2014-03-24: 5 [IU] via SUBCUTANEOUS
  Administered 2014-03-24 – 2014-03-25 (×2): 3 [IU] via SUBCUTANEOUS
  Administered 2014-03-25: 2 [IU] via SUBCUTANEOUS
  Administered 2014-03-25: 5 [IU] via SUBCUTANEOUS

## 2014-03-23 MED ORDER — CEFAZOLIN SODIUM-DEXTROSE 2-3 GM-% IV SOLR
2.0000 g | Freq: Three times a day (TID) | INTRAVENOUS | Status: DC
  Administered 2014-03-23 – 2014-03-30 (×21): 2 g via INTRAVENOUS
  Filled 2014-03-23 (×25): qty 50

## 2014-03-23 NOTE — Progress Notes (Signed)
[facility]  Date of Admission:  03/20/2014  Antibiotics: Cefazolin day 1 Vancomycin stopped  Subjective: Feels better  Objective: Temp:  [99.4 F (37.4 C)-100.7 F (38.2 C)] 99.4 F (37.4 C) (12/23 0623) Pulse Rate:  [81-88] 81 (12/23 0623) Resp:  [18] 18 (12/23 0623) BP: (130-134)/(56-63) 130/56 mmHg (12/23 0623) SpO2:  [96 %-100 %] 96 % (12/23 0623)  General: awake, alert, nad Skin: no rashes Lungs: CTA B Cor: RRR Abdomen: soft, nt, nd Ext: left foot wrapped  Lab Results Lab Results  Component Value Date   WBC 10.5 03/22/2014   HGB 8.8* 03/22/2014   HCT 28.5* 03/22/2014   MCV 79.6 03/22/2014   PLT 403* 03/22/2014    Lab Results  Component Value Date   CREATININE 1.28 03/23/2014   BUN 11 03/22/2014   NA 130* 03/22/2014   K 3.7 03/22/2014   CL 97 03/22/2014   CO2 25 03/22/2014    Lab Results  Component Value Date   ALT 25 03/21/2014   AST 32 03/21/2014   ALKPHOS 117 03/21/2014   BILITOT 0.4 03/21/2014      Microbiology: Recent Results (from the past 240 hour(s))  Culture, blood (routine x 2)     Status: None   Collection Time: 03/20/14  1:30 PM  Result Value Ref Range Status   Specimen Description BLOOD RIGHT WRIST  Final   Special Requests BOTTLES DRAWN AEROBIC AND ANAEROBIC 5CCS  Final   Culture  Setup Time   Final    03/20/2014 23:45 Performed at [facility]    Culture   Final    STAPHYLOCOCCUS AUREUS Note: RIFAMPIN AND GENTAMICIN SHOULD NOT BE USED AS SINGLE DRUGS FOR TREATMENT OF STAPH INFECTIONS. Note: Gram Stain Report Called to,Read Back By and Verified With: CAROL HUCKABEE 03/21/14 1025 BY SMITHERSJ Performed at [facility]    Report Status 03/23/2014 FINAL  Final   Organism ID, Bacteria STAPHYLOCOCCUS AUREUS  Final      Susceptibility   Staphylococcus aureus - MIC*    CLINDAMYCIN <=0.25 SENSITIVE Sensitive     ERYTHROMYCIN <=0.25 SENSITIVE Sensitive     GENTAMICIN <=0.5 SENSITIVE  Sensitive     LEVOFLOXACIN <=0.12 SENSITIVE Sensitive     OXACILLIN 0.5 SENSITIVE Sensitive     PENICILLIN >=0.5 RESISTANT Resistant     RIFAMPIN <=0.5 SENSITIVE Sensitive     TRIMETH/SULFA <=10 SENSITIVE Sensitive     VANCOMYCIN <=0.5 SENSITIVE Sensitive     TETRACYCLINE >=16 RESISTANT Resistant     MOXIFLOXACIN <=0.25 SENSITIVE Sensitive     * STAPHYLOCOCCUS AUREUS  Culture, blood (routine x 2)     Status: None   Collection Time: 03/20/14  2:11 PM  Result Value Ref Range Status   Specimen Description BLOOD LEFT ANTECUBITAL  Final   Special Requests BOTTLES DRAWN AEROBIC AND ANAEROBIC 3CCS  Final   Culture  Setup Time   Final    03/20/2014 23:45 Performed at [facility]    Culture   Final    STAPHYLOCOCCUS AUREUS Note: SUSCEPTIBILITIES PERFORMED ON PREVIOUS CULTURE WITHIN THE LAST 5 DAYS. Note: Gram Stain Report Called to,Read Back By and Verified With: CAROL HUCKABEE 03/21/14 1025 BY SMITHERSJ Culture results may be compromised due to an inadequate volume of blood received in culture bottles. Performed at [facility]    Report Status 03/23/2014 FINAL  Final  Urine culture     Status: None   Collection Time: 03/20/14  3:49 PM  Result Value Ref Range Status   Specimen Description URINE, CLEAN CATCH  Final   Special Requests Normal  Final   Culture  Setup Time   Final    03/20/2014 21:30 Performed at [facility]    Colony Count NO GROWTH Performed at [facility]   Final   Culture NO GROWTH Performed at [facility]   Final   Report Status 03/21/2014 FINAL  Final    Studies/Results: Dg Shoulder Right  03/22/2014   CLINICAL DATA:  Right shoulder pain for 3 days, no known injury, bacteremia, initial encounter  EXAM: RIGHT SHOULDER - 2+ VIEW  COMPARISON:  None.  FINDINGS: Mild degenerative changes of the acromioclavicular joint are seen. No fracture or dislocation is seen. No bony erosions are noted.  IMPRESSION: No acute  abnormality seen.   Electronically Signed   By: Alcide CleverMark  Lukens M.D.   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 Fever and chills. Left foot pain and swelling.  EXAM: MRI OF THE LEFT FOREFOOT WITHOUT CONTRAST  TECHNIQUE: Multiplanar, multisequence MR imaging was performed. No intravenous contrast was administered.  COMPARISON:  CT scan of the left foot stress as CT scan of the left hindfoot 03/20/2014.  FINDINGS: There appear to be soft tissue wounds on the medial and lateral side of the patient's stump. Soft tissue edema about the patient's stump appears worse medially. There is a subcutaneous air and fluid collection in the medial plantar soft tissues measuring approximately 3.8 cm long by 1 cm craniocaudal by 3.2 cm transverse highly suspicious for abscess. There appears to be a second fluid collection extending into the abductor hallucis muscle measuring 2.1 cm craniocaudal by 1.2 cm transverse by 5.3 cm long. The muscle is markedly atrophic.  Marrow edema is seen throughout the cuboid. Bone fragment likely representing the remnant of the lateral cuneiform is also markedly edematous. Multiple small bony fragments in the stump more medially also demonstrate edema. Very mild edema is seen in the calcaneus at the calcaneocuboid joint with only trace amount of joint fluid present.  IMPRESSION: Findings most consistent with cellulitis the patient stump with marrow signal change consistent with osteomyelitis in the cuboid and remnants of the cuneiforms.  Air and fluid collection in the medial plantar subcutaneous tissues is highly suspicious for abscess.  Fluid tracking in the flexor hallucis muscle belly is also worrisome for abscess although edema could be secondary to denervation atrophy.   Electronically Signed   By: Drusilla Kannerhomas  Dalessio M.D.   On: 03/22/2014 15:19     Assessment/Plan:  1) osteomyelitis with abscess - MRI of foot c/w osteomyelitis in cuboid and cuneiforms, abscess medial plantar and possibly flexor hallucis.  2/2 blood cultures with MSSA.  Repeat blood cultures sent.  TTE pending.  Will need prolonged antibiotics so I don't feel TEE is needed.   -needs ortho re-eval for ? Debridement or amputation with MRI findings  Issam Carlyon, Molly MaduroOBERT, MD [facility] [email or web address] [phone] pager   [phone] cell 03/23/2014, 2:18 PM

## 2014-03-23 NOTE — Progress Notes (Signed)
  Echocardiogram 2D Echocardiogram has been performed.  Arvil ChacoFoster, Brenee Gajda 03/23/2014, 3:36 PM

## 2014-03-23 NOTE — Progress Notes (Signed)
ANTIBIOTIC CONSULT NOTE - FOLLOW UP  Pharmacy Consult for Vancomycin Indication: Osteomyelitis  Allergies  Allergen Reactions  . Cilostazol Other (See Comments)    Feels like feet on fire    Patient Measurements: Height: 5\' 9"  (175.3 cm) Weight: 218 lb 0.6 oz (98.9 kg) IBW/kg (Calculated) : 70.7 Adjusted Body Weight:   Vital Signs: Temp: 99.4 F (37.4 C) (12/23 0623) Temp Source: Oral (12/23 0623) BP: 130/56 mmHg (12/23 0623) Pulse Rate: 81 (12/23 0623) Intake/Output from previous day: 12/22 0701 - 12/23 0700 In: 1200 [P.O.:1200] Out: 250 [Urine:250] Intake/Output from this shift:    Labs:  Recent Labs  03/20/14 1320 03/21/14 0510 03/22/14 0430  WBC 11.5* 12.0* 10.5  HGB 9.4* 9.7* 8.8*  PLT 299 384 403*  CREATININE 1.10 1.05 1.17   Estimated Creatinine Clearance: 65.2 mL/min (by C-G formula based on Cr of 1.17).  Recent Labs  03/22/14 1623 03/23/14 0545  VANCOTROUGH 35.7*  --   VANCORANDOM  --  23.0     Microbiology: Recent Results (from the past 720 hour(s))  Culture, blood (routine x 2)     Status: None   Collection Time: 03/20/14  1:30 PM  Result Value Ref Range Status   Specimen Description BLOOD RIGHT WRIST  Final   Special Requests BOTTLES DRAWN AEROBIC AND ANAEROBIC 5CCS  Final   Culture  Setup Time   Final    03/20/2014 23:45 Performed at [facility]    Culture   Final    STAPHYLOCOCCUS AUREUS Note: RIFAMPIN AND GENTAMICIN SHOULD NOT BE USED AS SINGLE DRUGS FOR TREATMENT OF STAPH INFECTIONS. Note: Gram Stain Report Called to,Read Back By and Verified With: CAROL HUCKABEE 03/21/14 1025 BY SMITHERSJ Performed at [facility]    Report Status 03/23/2014 FINAL  Final   Organism ID, Bacteria STAPHYLOCOCCUS AUREUS  Final      Susceptibility   Staphylococcus aureus - MIC*    CLINDAMYCIN <=0.25 SENSITIVE Sensitive     ERYTHROMYCIN <=0.25 SENSITIVE Sensitive     GENTAMICIN <=0.5 SENSITIVE Sensitive     LEVOFLOXACIN <=0.12  SENSITIVE Sensitive     OXACILLIN 0.5 SENSITIVE Sensitive     PENICILLIN >=0.5 RESISTANT Resistant     RIFAMPIN <=0.5 SENSITIVE Sensitive     TRIMETH/SULFA <=10 SENSITIVE Sensitive     VANCOMYCIN <=0.5 SENSITIVE Sensitive     TETRACYCLINE >=16 RESISTANT Resistant     MOXIFLOXACIN <=0.25 SENSITIVE Sensitive     * STAPHYLOCOCCUS AUREUS  Culture, blood (routine x 2)     Status: None   Collection Time: 03/20/14  2:11 PM  Result Value Ref Range Status   Specimen Description BLOOD LEFT ANTECUBITAL  Final   Special Requests BOTTLES DRAWN AEROBIC AND ANAEROBIC 3CCS  Final   Culture  Setup Time   Final    03/20/2014 23:45 Performed at [facility]    Culture   Final    STAPHYLOCOCCUS AUREUS Note: SUSCEPTIBILITIES PERFORMED ON PREVIOUS CULTURE WITHIN THE LAST 5 DAYS. Note: Gram Stain Report Called to,Read Back By and Verified With: CAROL HUCKABEE 03/21/14 1025 BY SMITHERSJ Culture results may be compromised due to an inadequate volume of blood received in culture bottles. Performed at [facility]    Report Status 03/23/2014 FINAL  Final  Urine culture     Status: None   Collection Time: 03/20/14  3:49 PM  Result Value Ref Range Status   Specimen Description URINE, CLEAN CATCH  Final   Special Requests Normal  Final  Culture  Setup Time   Final    03/20/2014 21:30 Performed at [facility]    Colony Count NO GROWTH Performed at [facility]   Final   Culture NO GROWTH Performed at [facility]   Final   Report Status 03/21/2014 FINAL  Final    Anti-infectives    Start     Dose/Rate Route Frequency Ordered Stop   03/20/14 2200  cefTRIAXone (ROCEPHIN) 2 g in dextrose 5 % 50 mL IVPB - Premix  Status:  Discontinued     2 g100 mL/hr over 30 Minutes Intravenous Every 24 hours 03/20/14 1714 03/21/14 1754   03/20/14 2200  metroNIDAZOLE (FLAGYL) tablet 500 mg  Status:  Discontinued     500 mg Oral 3 times per day 03/20/14 1714 03/21/14 1754    03/20/14 1700  vancomycin (VANCOCIN) 1,250 mg in sodium chloride 0.9 % 250 mL IVPB  Status:  Discontinued     1,250 mg166.7 mL/hr over 90 Minutes Intravenous Every 12 hours 03/20/14 1554 03/22/14 1748   03/20/14 1600  metroNIDAZOLE (FLAGYL) IVPB 500 mg     500 mg100 mL/hr over 60 Minutes Intravenous  Once 03/20/14 1548 03/20/14 1707   03/20/14 1600  ceFEPIme (MAXIPIME) 2 g in dextrose 5 % 50 mL IVPB     2 g100 mL/hr over 30 Minutes Intravenous  Once 03/20/14 1548 03/20/14 1647      Assessment: 73yo male with osteomyelitis; Vancomycin currently on hold due to supratherapeutic trough yesterday.  Random Vanc level this AM is 23, calculated t1/2 ~20h.  Pt with Tm 100.7 last PM, repeat blood cx drawn this AM.  Blood cultures from 12/20 (+)S.Aureus, resistant to PCN and Tetracycline only.  Cr 1.17 on 12/22, normalized CrCl ~48.  Goal of Therapy:  Vancomycin trough level 15-20 mcg/ml  Plan:  1-  Resume Vancomycin 750mg  IV q12 2-  Check Cr this AM 3-  F/U repeat cx and check ss level.  Marisue HumbleKendra Ticara Waner, PharmD Clinical Pharmacist [facility]

## 2014-03-23 NOTE — Consult Note (Signed)
Reason for Consult: Left foot infection Referring Physician: Dr. Haskel Schroeder Jordan [name] is an 73 y.o. male.  HPI: Jordan [name] is a 73 year old patient who underwent transmetatarsal amputation in early November he was doing well until doing started breaking down and having some drainage. He's been admitted in the hospital for septicemia. His been treated with IV antibiotics and has improved but his foot has continued to drain. MRI scan shows abscess and osteomyelitis of the remaining cuboid and midfoot bones.  Past Medical History  Diagnosis Date  . CAD (coronary artery disease)     a. s/p IMI in past tx with POBA and cath 6 mos later with occluded RCA;  b. h/o Taxus DES to LAD and CFX;  c.  cath 1/10: LM ok, mLAD 40-50%, LAD stent ok, D2 tandem 60-70%, pCFX 30%, mAVCFX stent ok, pRCA 40%, RV 90%, RV marginal 90%, dRCA filled L-R collats tx medically  . Carotid stenosis     dopplers 5/11: 40-59% bilat  . DM2 (diabetes mellitus, type 2)   . HTN (hypertension)   . HLD (hyperlipidemia)   . Diabetes mellitus     Past Surgical History  Procedure Laterality Date  . Cardiac catheterization    . Femoral-tibial bypass graft Left 01/28/2014    Procedure:  FEMORAL-ANTERIOR TIBIAL ARTERY Bypass Graft utilizing composite gortex graft and vein graft;  Surgeon: Angelia Mould, MD;  Location: [place];  Service: Vascular;  Laterality: Left;  . Amputation Left 01/28/2014    Procedure: AMPUTATION DIGIT- LEFT 5TH TOE;  Surgeon: Angelia Mould, MD;  Location: [place];  Service: Vascular;  Laterality: Left;  . Amputation Left 02/02/2014    Procedure: Left 4th Ray Amputation vs Transmetatarsal Amputation;  Surgeon: Newt Minion, MD;  Location: [place];  Service: Orthopedics;  Laterality: Left;  . Abdominal aortagram N/A 04/07/2013    Procedure: ABDOMINAL Maxcine Ham;  Surgeon: Wellington Hampshire, MD;  Location: [facility] CATH LAB;  Service: Cardiovascular;  Laterality: N/A;  . Lower extremity angiogram Left 01/26/2014     Procedure: LOWER EXTREMITY ANGIOGRAM;  Surgeon: Wellington Hampshire, MD;  Location: [place] CATH LAB;  Service: Cardiovascular;  Laterality: Left;  . Abdominal aortagram N/A 01/26/2014    Procedure: ABDOMINAL AORTAGRAM;  Surgeon: Wellington Hampshire, MD;  Location: [facility] CATH LAB;  Service: Cardiovascular;  Laterality: N/A;    Family History  Problem Relation Age of Onset  . Diabetes Mother   . Diabetes Sister     Social History:  reports that he quit smoking about 3 years ago. His smoking use included Pipe. He has never used smokeless tobacco. He reports that he does not drink alcohol or use illicit drugs.  Allergies:  Allergies  Allergen Reactions  . Cilostazol Other (See Comments)    Feels like feet on fire    Medications: I have reviewed the patient's current medications.  Results for orders placed or performed during the hospital encounter of 03/20/14 (from the past 48 hour(s))  Basic metabolic panel     Status: Abnormal   Collection Time: 03/22/14  4:30 AM  Result Value Ref Range   Sodium 130 (L) 135 - 145 mmol/L    Comment: Please note change in reference range.   Potassium 3.7 3.5 - 5.1 mmol/L    Comment: Please note change in reference range.   Chloride 97 96 - 112 mEq/L   CO2 25 19 - 32 mmol/L   Glucose, Bld 309 (H) 70 - 99 mg/dL   BUN 11 6 -  23 mg/dL   Creatinine, Ser 1.17 0.50 - 1.35 mg/dL   Calcium 8.3 (L) 8.4 - 10.5 mg/dL   GFR calc non Af Amer 60 (L) >90 mL/min   GFR calc Af Amer 70 (L) >90 mL/min    Comment: (NOTE) The eGFR has been calculated using the CKD EPI equation. This calculation has not been validated in all clinical situations. eGFR's persistently <90 mL/min signify possible Chronic Kidney Disease.    Anion gap 8 5 - 15  CBC     Status: Abnormal   Collection Time: 03/22/14  4:30 AM  Result Value Ref Range   WBC 10.5 4.0 - 10.5 K/uL   RBC 3.58 (L) 4.22 - 5.81 MIL/uL   Hemoglobin 8.8 (L) 13.0 - 17.0 g/dL   HCT 28.5 (L) 39.0 - 52.0 %   MCV 79.6 78.0 -  100.0 fL   MCH 24.6 (L) 26.0 - 34.0 pg   MCHC 30.9 30.0 - 36.0 g/dL   RDW 16.4 (H) 11.5 - 15.5 %   Platelets 403 (H) 150 - 400 K/uL  Glucose, capillary     Status: Abnormal   Collection Time: 03/22/14  7:41 AM  Result Value Ref Range   Glucose-Capillary 339 (H) 70 - 99 mg/dL  Glucose, capillary     Status: Abnormal   Collection Time: 03/22/14 12:52 PM  Result Value Ref Range   Glucose-Capillary 477 (H) 70 - 99 mg/dL  Vancomycin, trough     Status: Abnormal   Collection Time: 03/22/14  4:23 PM  Result Value Ref Range   Vancomycin Tr 35.7 (HH) 10.0 - 20.0 ug/mL    Comment: REPEATED TO VERIFY CRITICAL RESULT CALLED TO, READ BACK BY AND VERIFIED WITH: Carron Brazen 1722 03/22/14 D BRADLEY   Glucose, capillary     Status: Abnormal   Collection Time: 03/22/14  5:25 PM  Result Value Ref Range   Glucose-Capillary 336 (H) 70 - 99 mg/dL  Glucose, capillary     Status: Abnormal   Collection Time: 03/22/14  9:51 PM  Result Value Ref Range   Glucose-Capillary 420 (H) 70 - 99 mg/dL  Vancomycin, random     Status: None   Collection Time: 03/23/14  5:45 AM  Result Value Ref Range   Vancomycin Rm 23.0 ug/mL    Comment:        Random Vancomycin therapeutic range is dependent on dosage and time of specimen collection. A peak range is 20.0-40.0 ug/mL A trough range is 5.0-15.0 ug/mL          Creatinine, serum     Status: Abnormal   Collection Time: 03/23/14  5:45 AM  Result Value Ref Range   Creatinine, Ser 1.28 0.50 - 1.35 mg/dL   GFR calc non Af Amer 54 (L) >90 mL/min   GFR calc Af Amer 62 (L) >90 mL/min    Comment: (NOTE) The eGFR has been calculated using the CKD EPI equation. This calculation has not been validated in all clinical situations. eGFR's persistently <90 mL/min signify possible Chronic Kidney Disease.   Glucose, capillary     Status: Abnormal   Collection Time: 03/23/14  8:30 AM  Result Value Ref Range   Glucose-Capillary 433 (H) 70 - 99 mg/dL  Glucose, capillary      Status: Abnormal   Collection Time: 03/23/14 12:07 PM  Result Value Ref Range   Glucose-Capillary 349 (H) 70 - 99 mg/dL  Glucose, capillary     Status: Abnormal   Collection Time: 03/23/14  4:08  PM  Result Value Ref Range   Glucose-Capillary 275 (H) 70 - 99 mg/dL  Brain natriuretic peptide     Status: Abnormal   Collection Time: 03/23/14  7:22 PM  Result Value Ref Range   B Natriuretic Peptide 119.9 (H) 0.0 - 100.0 pg/mL    Comment: Please note change in reference range.  Glucose, capillary     Status: Abnormal   Collection Time: 03/23/14  8:04 PM  Result Value Ref Range   Glucose-Capillary 264 (H) 70 - 99 mg/dL   Comment 1 Notify RN    Comment 2 Documented in Chart     Dg Shoulder Right  03/22/2014   CLINICAL DATA:  Right shoulder pain for 3 days, no known injury, bacteremia, initial encounter  EXAM: RIGHT SHOULDER - 2+ VIEW  COMPARISON:  None.  FINDINGS: Mild degenerative changes of the acromioclavicular joint are seen. No fracture or dislocation is seen. No bony erosions are noted.  IMPRESSION: No acute abnormality seen.   Electronically Signed   By: Inez Catalina M.D.   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 Fever and chills. Left foot pain and swelling.  EXAM: MRI OF THE LEFT FOREFOOT WITHOUT CONTRAST  TECHNIQUE: Multiplanar, multisequence MR imaging was performed. No intravenous contrast was administered.  COMPARISON:  CT scan of the left foot stress as CT scan of the left hindfoot 03/20/2014.  FINDINGS: There appear to be soft tissue wounds on the medial and lateral side of the patient's stump. Soft tissue edema about the patient's stump appears worse medially. There is a subcutaneous air and fluid collection in the medial plantar soft tissues measuring approximately 3.8 cm long by 1 cm craniocaudal by 3.2 cm transverse highly suspicious for abscess.  There appears to be a second fluid collection extending into the abductor hallucis muscle measuring 2.1 cm craniocaudal by 1.2 cm transverse by 5.3 cm long. The muscle is markedly atrophic.  Marrow edema is seen throughout the cuboid. Bone fragment likely representing the remnant of the lateral cuneiform is also markedly edematous. Multiple small bony fragments in the stump more medially also demonstrate edema. Very mild edema is seen in the calcaneus at the calcaneocuboid joint with only trace amount of joint fluid present.  IMPRESSION: Findings most consistent with cellulitis the patient stump with marrow signal change consistent with osteomyelitis in the cuboid and remnants of the cuneiforms.  Air and fluid collection in the medial plantar subcutaneous tissues is highly suspicious for abscess.  Fluid tracking in the flexor hallucis muscle belly is also worrisome for abscess although edema could be secondary to denervation atrophy.   Electronically Signed   By: Inge Rise M.D.   On: 03/22/2014 15:19    Review of Systems  Constitutional: Positive for fever, chills and malaise/fatigue.  HENT: Negative.   Eyes: Negative.   Respiratory: Negative.   Cardiovascular: Negative.   Gastrointestinal: Negative.   Genitourinary: Negative.   Musculoskeletal: Negative.   Skin: Negative.   Neurological: Negative.   Endo/Heme/Allergies: Negative.   Psychiatric/Behavioral: Negative.    Blood pressure 149/63, pulse 80, temperature 99.2 F (37.3 C), temperature source Oral, resp. rate 17, height '5\' 9"'  (1.753 m), weight 98.9 kg (218 lb 0.6 oz), SpO2 96 %. Physical Exam  Constitutional: He appears well-developed.  HENT:  Head: Normocephalic.  Eyes: Pupils are equal, round, and reactive to light.  Neck: Normal range of motion.  Cardiovascular: Normal rate.   Respiratory: Effort normal.  Neurological: He is alert.  Skin: Skin is warm.  Psychiatric: He has a normal mood and affect.   examination the  left foot demonstrates diminished and absent sensation he has a transmetatarsal amputation with no real free skin removal skin around the 2 flaps. There are 2 open areas on the medial lateral aspect of the incision about the size of a quarter. Some serous drainage is present there is no fluctuance erythema induration or gas crepitus in the tissues. The foot is perfused. No knee effusion on the left.  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 He had an echocardiogram today which showed mild aortic stenosis. He has had bacteremia is been hospitalized but it is feeling better now that he is on IV antibiotics. Long talk with the patient and his wife tonight. I'll think that debridement or wound care or IV antibiotics for really cure this infection particularly the bone infection and the abscess. I don't really think there is enough free skin to further debride the bones of the foot and achieve closure. I think the patient's best option for one surgery to take care of the problem would be a below-knee amputation however even that may or may not heal based on his nutritional status. Patient desires correction of this current problem which I think is the most reasonable course of action to take. Risk and benefits of surgery discussed included but limited to infection or vessel damage chance of hematoma require higher level amputation. All questions answered. We'll plan on proceeding with surgery tomorrow. Nothing by mouth after midnight tonight and hold subcutaneous heparin. Plan on surgery in the early afternoon.  Jordan [name] 03/23/2014, 10:16 PM

## 2014-03-23 NOTE — Progress Notes (Signed)
03/23/14  Pharmacy- Cefazolin 0915  Cr 1.28  A/P:  73yo male with blood cx x 2 (+)Staph Aureus, resistant to PCN & Tetracycline only.  To change antibiotic from Vancomycin to Cefazolin.  Cr added on this AM reflects continued upward trend, normalized CrCl ~43.  1-  Cefazolin 2g IV q8 2-  F/U repeat blood cx and watch renal fxn  Marisue HumbleKendra Heiress Williamson, PharmD Clinical Pharmacist [facility]

## 2014-03-23 NOTE — Progress Notes (Addendum)
Inpatient Diabetes Program Recommendations  AACE/ADA: New Consensus Statement on Inpatient Glycemic Control (2013)  Target Ranges:  Prepandial:   less than 140 mg/dL      Peak postprandial:   less than 180 mg/dL (1-2 hours)      Critically ill patients:  140 - 180 mg/dL   Reason for Assessment:  Results for Jordan [name], Jordan [name] (MRN [id number]) as of 03/23/2014 08:41  Ref. Range 03/21/2014 21:00 03/22/2014 07:41 03/22/2014 12:52 03/22/2014 17:25 03/22/2014 21:51  Glucose-Capillary Latest Range: 70-99 mg/dL 409333 (H) 811339 (H) 914477 (H) 336 (H) 420 (H)   Diabetes history: DM, Type 2 Outpatient Diabetes medications: Lantus 25 units q AM and Lantus 20 units q PM (given prn per patient) Current orders for Inpatient glycemic control:  Lantus 28 units daily, Novolog moderate tid with meals  MD notified of CBG greater than 400 mg/dL. Called and discussed with Dr. Rito EhrlichKrishnan.  Orders received.  Discussed with patient.  He states that he does take a pm dose of Lantus as needed at home.  Discussed importance of glycemic control.  Wife states that he loves OJ but he has decided not to drink this morning since his blood glucose is high.    Will follow.  Thanks, Beryl MeagerJenny Stephen Turnbaugh, RN, BC-ADM Inpatient Diabetes Coordinator Pager [phone]

## 2014-03-23 NOTE — Progress Notes (Signed)
PROGRESS NOTE  Jordan [name] ZOX:[id number] DOB: [birth year] DOA: 03/20/2014 PCP: [name], MD  HPI/Recap of past 66 hours: 73 year old male with past history of poorly controlled diabetes mellitus with secondary peripheral vascular disease and CAD status post left arterial bypass in early November followed by partial foot amputation who has been following with vascular and orthopedic surgery and then presented to the emergency room after 1 week of overall malaise and 2 days of fevers, chills and elevated blood sugars as well as some wound dehiscence. Patient presented to the emergency room on 12/20 and noted to be afebrile with no leukocytosis, but foot x-ray noted area of lucency and possible osteomyelitis. Patient's admitted to the hospitalist service, started on broad-spectrum antibiotics.   Patient seen by orthopedist surgery who felt like the findings on x-ray were more related to chronic findings with no osteomyelitis. No abscess, cellulitis or drainage was noted. They felt the ulcerations were ischemic in patients be continued on nitroglycerin patch plus daily dressings. However this afternoon, patient's blood cultures have come back positive for 4/4 bottles of gram-positive cocci in clusters, which came back as staph aureus, not found resistant only to penicillin and tetracycline.  ID consulted & 2D echo ordered.  MRI of foot confirms abscess + osteomyelitis.  Feels like left shoulder which was bothering him before is feeling better. Fatigued   Assessment/Plan: Principal Problem:   SIRS (systemic inflammatory response syndrome) in the setting of with secondary diabetic foot ulcer and history of recent midfoot amputation w/osteomyelitis: Continue vancomycin. Called orthopedic office this morning awaiting follow-up. Possible amputation may be needed   Chronic diastolic heart failure: Noted on echocardiogram. Check BNP. Already on ARB plus beta blocker  Active Problems:   DM (diabetes  mellitus), type 2, uncontrolled, periph vascular complic: A1c elevated, however sugars are disproportionate to this and likely in the context of acute illness from bacteremia and possibly osteomyelitis underlying: Have increased basal Lantus and mid adjustments, appreciate diabetes educator follow-up    Hyperlipidemia: Continue statin   Essential hypertension: Blood pressures overall stable  Mild aortic stenosis: Noted on echocardiogram    Protein-calorie malnutrition, severe: Patient meets criteria for severe malnutrition of acute illness. Seen by nutrition. Recommending boost twice a day during hospitalization  Shoulder pain: xrays done, no evidence of fracture or abnormal lucency  Code Status: Full code   Family Communication: Wife at the bedside   Disposition Plan: Continue in-hospital, waiting for Ortho follow-up disposition   Consultants:  Orthopedic surgery   ID  Procedures:  None   Antibiotics:  IV Rocephin 12/20-12/21  IV vancomycin 12/20-12/23  IV Cipro 12/20-12/21  IV Ancef 12/23-present   Objective: BP 116/58 mmHg  Pulse 81  Temp(Src) 98.5 F (36.9 C) (Oral)  Resp 16  Ht 5\' 9"  (1.753 m)  Wt 98.9 kg (218 lb 0.6 oz)  BMI 32.18 kg/m2  SpO2 80% No intake or output data in the 24 hours ending 03/23/14 1754 Filed Weights   03/20/14 1316 03/20/14 1702 03/22/14 0454  Weight: 91.173 kg (201 lb) 94.7 kg (208 lb 12.4 oz) 98.9 kg (218 lb 0.6 oz)    Exam:   General:  Alert and oriented 3, no acute distress  Cardiovascular: Regular rate and rhythm, S1-S2, 2/6 systolic ejection murmur  Respiratory: Clear to auscultation bilaterally  Abdomen: Soft, nontender, nondistended, positive bowel sounds   Musculoskeletal: Left foot wrapped, status post midfoot amputation  Right shoulder-limited ROM  Data Reviewed: Basic Metabolic Panel:  Recent Labs Lab 03/20/14 1320 03/21/14 0510  03/22/14 0430 03/23/14 0545  NA 131* 131* 130*  --   K 4.6 4.6 3.7  --    CL 91* 93* 97  --   CO2 26 23 25   --   GLUCOSE 401* 325* 309*  --   BUN 11 11 11   --   CREATININE 1.10 1.05 1.17 1.28  CALCIUM 9.1 8.7 8.3*  --    Liver Function Tests:  Recent Labs Lab 03/20/14 1839 03/21/14 0510  AST  --  32  ALT  --  25  ALKPHOS  --  117  BILITOT  --  0.4  PROT  --  8.3  ALBUMIN 2.4* 2.3*   No results for input(s): LIPASE, AMYLASE in the last 168 hours. No results for input(s): AMMONIA in the last 168 hours. CBC:  Recent Labs Lab 03/20/14 1320 03/21/14 0510 03/22/14 0430  WBC 11.5* 12.0* 10.5  NEUTROABS 8.5*  --   --   HGB 9.4* 9.7* 8.8*  HCT 29.9* 29.9* 28.5*  MCV 80.4 81.3 79.6  PLT 299 384 403*   Cardiac Enzymes:    Recent Labs Lab 03/20/14 1320  TROPONINI <0.30   BNP (last 3 results) No results for input(s): PROBNP in the last 8760 hours. CBG:  Recent Labs Lab 03/22/14 1725 03/22/14 2151 03/23/14 0830 03/23/14 1207 03/23/14 1608  GLUCAP 336* 420* 433* 349* 275*    Recent Results (from the past 240 hour(s))  Culture, blood (routine x 2)     Status: None   Collection Time: 03/20/14  1:30 PM  Result Value Ref Range Status   Specimen Description BLOOD RIGHT WRIST  Final   Special Requests BOTTLES DRAWN AEROBIC AND ANAEROBIC 5CCS  Final   Culture  Setup Time   Final    03/20/2014 23:45 Performed at [facility]    Culture   Final    STAPHYLOCOCCUS AUREUS Note: RIFAMPIN AND GENTAMICIN SHOULD NOT BE USED AS SINGLE DRUGS FOR TREATMENT OF STAPH INFECTIONS. Note: Gram Stain Report Called to,Read Back By and Verified With: CAROL HUCKABEE 03/21/14 1025 BY SMITHERSJ Performed at [facility]    Report Status 03/23/2014 FINAL  Final   Organism ID, Bacteria STAPHYLOCOCCUS AUREUS  Final      Susceptibility   Staphylococcus aureus - MIC*    CLINDAMYCIN <=0.25 SENSITIVE Sensitive     ERYTHROMYCIN <=0.25 SENSITIVE Sensitive     GENTAMICIN <=0.5 SENSITIVE Sensitive     LEVOFLOXACIN <=0.12 SENSITIVE Sensitive      OXACILLIN 0.5 SENSITIVE Sensitive     PENICILLIN >=0.5 RESISTANT Resistant     RIFAMPIN <=0.5 SENSITIVE Sensitive     TRIMETH/SULFA <=10 SENSITIVE Sensitive     VANCOMYCIN <=0.5 SENSITIVE Sensitive     TETRACYCLINE >=16 RESISTANT Resistant     MOXIFLOXACIN <=0.25 SENSITIVE Sensitive     * STAPHYLOCOCCUS AUREUS  Culture, blood (routine x 2)     Status: None   Collection Time: 03/20/14  2:11 PM  Result Value Ref Range Status   Specimen Description BLOOD LEFT ANTECUBITAL  Final   Special Requests BOTTLES DRAWN AEROBIC AND ANAEROBIC 3CCS  Final   Culture  Setup Time   Final    03/20/2014 23:45 Performed at [facility]    Culture   Final    STAPHYLOCOCCUS AUREUS Note: SUSCEPTIBILITIES PERFORMED ON PREVIOUS CULTURE WITHIN THE LAST 5 DAYS. Note: Gram Stain Report Called to,Read Back By and Verified With: CAROL HUCKABEE 03/21/14 1025 BY SMITHERSJ Culture results may be compromised due to an  inadequate volume of blood received in culture bottles. Performed at [facility]    Report Status 03/23/2014 FINAL  Final  Urine culture     Status: None   Collection Time: 03/20/14  3:49 PM  Result Value Ref Range Status   Specimen Description URINE, CLEAN CATCH  Final   Special Requests Normal  Final   Culture  Setup Time   Final    03/20/2014 21:30 Performed at [facility]    Colony Count NO GROWTH Performed at [facility]   Final   Culture NO GROWTH Performed at [facility]   Final   Report Status 03/21/2014 FINAL  Final     Studies: Dg Shoulder Right  03/22/2014   CLINICAL DATA:  Right shoulder pain for 3 days, no known injury, bacteremia, initial encounter  EXAM: RIGHT SHOULDER - 2+ VIEW  COMPARISON:  None.  FINDINGS: Mild degenerative changes of the acromioclavicular joint are seen. No fracture or dislocation is seen. No bony erosions are noted.  IMPRESSION: No acute abnormality seen.   Electronically Signed   By: Alcide CleverMark  Lukens M.D.   On:  03/22/2014 16:46   Mr Foot Left Wo Contrast  03/22/2014   CLINICAL DATA:  Diabetic patient with a history of [place] amputation of the left foot 02/02/2014 for gangrene. Fever and chills. Left foot pain and swelling.  EXAM: MRI OF THE LEFT FOREFOOT WITHOUT CONTRAST  TECHNIQUE: Multiplanar, multisequence MR imaging was performed. No intravenous contrast was administered.  COMPARISON:  CT scan of the left foot stress as CT scan of the left hindfoot 03/20/2014.  FINDINGS: There appear to be soft tissue wounds on the medial and lateral side of the patient's stump. Soft tissue edema about the patient's stump appears worse medially. There is a subcutaneous air and fluid collection in the medial plantar soft tissues measuring approximately 3.8 cm long by 1 cm craniocaudal by 3.2 cm transverse highly suspicious for abscess. There appears to be a second fluid collection extending into the abductor hallucis muscle measuring 2.1 cm craniocaudal by 1.2 cm transverse by 5.3 cm long. The muscle is markedly atrophic.  Marrow edema is seen throughout the cuboid. Bone fragment likely representing the remnant of the lateral cuneiform is also markedly edematous. Multiple small bony fragments in the stump more medially also demonstrate edema. Very mild edema is seen in the calcaneus at the calcaneocuboid joint with only trace amount of joint fluid present.  IMPRESSION: Findings most consistent with cellulitis the patient stump with marrow signal change consistent with osteomyelitis in the cuboid and remnants of the cuneiforms.  Air and fluid collection in the medial plantar subcutaneous tissues is highly suspicious for abscess.  Fluid tracking in the flexor hallucis muscle belly is also worrisome for abscess although edema could be secondary to denervation atrophy.   Electronically Signed   By: Drusilla Kannerhomas  Dalessio M.D.   On: 03/22/2014 15:19    Scheduled Meds: . amLODipine  10 mg Oral Daily  . aspirin  81 mg Oral Daily  .   ceFAZolin (ANCEF) IV  2 g Intravenous 3 times per day  . docusate sodium  100 mg Oral BID  . feeding supplement (GLUCERNA SHAKE)  237 mL Oral BID BM  . heparin  5,000 Units Subcutaneous 3 times per day  . insulin aspart  0-15 Units Subcutaneous 6 times per day  . insulin glargine  20 Units Subcutaneous QHS  . insulin glargine  28 Units Subcutaneous Daily  . isosorbide  mononitrate  30 mg Oral Daily  . lactose free nutrition  237 mL Oral BID WC  . losartan  100 mg Oral Daily  . metoprolol succinate  100 mg Oral BID  . nitroGLYCERIN  0.2 mg Transdermal Daily  . rosuvastatin  40 mg Oral QPM  . silver sulfADIAZINE  1 application Topical Daily    Continuous Infusions:    Time spent: 25 minutes   Hollice Espy  [facility] Pager [phone] . If 7PM-7AM, please contact night-coverage at [email or web address], password [facility] 03/23/2014, 5:54 PM  LOS: 3 days

## 2014-03-23 NOTE — Progress Notes (Signed)
Utilization review completed.  

## 2014-03-24 ENCOUNTER — Inpatient Hospital Stay (HOSPITAL_COMMUNITY): Payer: Medicare PPO | Admitting: Certified Registered"

## 2014-03-24 ENCOUNTER — Encounter (HOSPITAL_COMMUNITY): Payer: Self-pay | Admitting: Anesthesiology

## 2014-03-24 ENCOUNTER — Encounter (HOSPITAL_COMMUNITY)
Admission: EM | Disposition: A | Discharge status: Skilled Nursing Facility | Payer: Self-pay | Source: Home / Self Care | Attending: Internal Medicine

## 2014-03-24 DIAGNOSIS — M869 Osteomyelitis, unspecified: Secondary | ICD-10-CM | POA: Diagnosis present

## 2014-03-24 DIAGNOSIS — L089 Local infection of the skin and subcutaneous tissue, unspecified: Secondary | ICD-10-CM | POA: Diagnosis present

## 2014-03-24 DIAGNOSIS — I5032 Chronic diastolic (congestive) heart failure: Secondary | ICD-10-CM

## 2014-03-24 HISTORY — PX: AMPUTATION: SHX166

## 2014-03-24 LAB — BASIC METABOLIC PANEL
Anion gap: 11 (ref 5–15)
BUN: 12 mg/dL (ref 6–23)
CO2: 25 mmol/L (ref 19–32)
Calcium: 8.3 mg/dL — ABNORMAL LOW (ref 8.4–10.5)
Chloride: 95 mEq/L — ABNORMAL LOW (ref 96–112)
Creatinine, Ser: 1.24 mg/dL (ref 0.50–1.35)
GFR calc Af Amer: 65 mL/min — ABNORMAL LOW (ref >90)
GFR calc non Af Amer: 56 mL/min — ABNORMAL LOW (ref >90)
Glucose, Bld: 164 mg/dL — ABNORMAL HIGH (ref 70–99)
Potassium: 3 mmol/L — ABNORMAL LOW (ref 3.5–5.1)
Sodium: 131 mmol/L — ABNORMAL LOW (ref 135–145)

## 2014-03-24 LAB — CBC
HCT: 27.7 % — ABNORMAL LOW (ref 39.0–52.0)
HCT: 30.2 % — ABNORMAL LOW (ref 39.0–52.0)
Hemoglobin: 8.7 g/dL — ABNORMAL LOW (ref 13.0–17.0)
Hemoglobin: 9.5 g/dL — ABNORMAL LOW (ref 13.0–17.0)
MCH: 24.9 pg — ABNORMAL LOW (ref 26.0–34.0)
MCH: 25.7 pg — ABNORMAL LOW (ref 26.0–34.0)
MCHC: 31.4 g/dL (ref 30.0–36.0)
MCHC: 31.5 g/dL (ref 30.0–36.0)
MCV: 79.4 fL (ref 78.0–100.0)
MCV: 81.8 fL (ref 78.0–100.0)
Platelets: 464 10*3/uL — ABNORMAL HIGH (ref 150–400)
Platelets: 468 10*3/uL — ABNORMAL HIGH (ref 150–400)
RBC: 3.49 MIL/uL — ABNORMAL LOW (ref 4.22–5.81)
RBC: 3.69 MIL/uL — ABNORMAL LOW (ref 4.22–5.81)
RDW: 16.4 % — ABNORMAL HIGH (ref 11.5–15.5)
RDW: 16.5 % — ABNORMAL HIGH (ref 11.5–15.5)
WBC: 10.4 10*3/uL (ref 4.0–10.5)
WBC: 10 10*3/uL (ref 4.0–10.5)

## 2014-03-24 LAB — GLUCOSE, CAPILLARY
Glucose-Capillary: 171 mg/dL — ABNORMAL HIGH (ref 70–99)
Glucose-Capillary: 176 mg/dL — ABNORMAL HIGH (ref 70–99)
Glucose-Capillary: 181 mg/dL — ABNORMAL HIGH (ref 70–99)
Glucose-Capillary: 191 mg/dL — ABNORMAL HIGH (ref 70–99)
Glucose-Capillary: 209 mg/dL — ABNORMAL HIGH (ref 70–99)
Glucose-Capillary: 219 mg/dL — ABNORMAL HIGH (ref 70–99)
Glucose-Capillary: 220 mg/dL — ABNORMAL HIGH (ref 70–99)
Glucose-Capillary: 274 mg/dL — ABNORMAL HIGH (ref 70–99)
Glucose-Capillary: 363 mg/dL — ABNORMAL HIGH (ref 70–99)

## 2014-03-24 LAB — SURGICAL PCR SCREEN
MRSA, PCR: NEGATIVE
Staphylococcus aureus: NEGATIVE

## 2014-03-24 LAB — CREATININE, SERUM
Creatinine, Ser: 1.38 mg/dL — ABNORMAL HIGH (ref 0.50–1.35)
GFR calc Af Amer: 57 mL/min — ABNORMAL LOW (ref >90)
GFR calc non Af Amer: 49 mL/min — ABNORMAL LOW (ref >90)

## 2014-03-24 SURGERY — AMPUTATION BELOW KNEE
Anesthesia: Regional | Site: Leg Lower | Laterality: Left

## 2014-03-24 MED ORDER — OXYCODONE HCL 5 MG/5ML PO SOLN: 5.0000 mg | Freq: Once | ORAL | Status: DC | PRN

## 2014-03-24 MED ORDER — MIRTAZAPINE 7.5 MG PO TABS
7.5000 mg | ORAL_TABLET | Freq: Every day | ORAL | Status: DC
  Administered 2014-03-24 – 2014-03-28 (×5): 7.5 mg via ORAL
  Filled 2014-03-24 (×7): qty 1

## 2014-03-24 MED ORDER — WARFARIN SODIUM 7.5 MG PO TABS
7.5000 mg | ORAL_TABLET | Freq: Once | ORAL | Status: AC
  Administered 2014-03-24: 7.5 mg via ORAL
  Filled 2014-03-24: qty 1

## 2014-03-24 MED ORDER — VANCOMYCIN HCL IN DEXTROSE 1-5 GM/200ML-% IV SOLN
1000.0000 mg | Freq: Two times a day (BID) | INTRAVENOUS | Status: AC
  Administered 2014-03-24: 1000 mg via INTRAVENOUS
  Filled 2014-03-24: qty 200

## 2014-03-24 MED ORDER — PANTOPRAZOLE SODIUM 40 MG PO TBEC
40.0000 mg | DELAYED_RELEASE_TABLET | Freq: Every day | ORAL | Status: DC
  Administered 2014-03-24 – 2014-03-30 (×7): 40 mg via ORAL
  Filled 2014-03-24 (×7): qty 1

## 2014-03-24 MED ORDER — ONDANSETRON HCL 4 MG/2ML IJ SOLN
INTRAMUSCULAR | Status: DC | PRN
  Administered 2014-03-24: 4 mg via INTRAVENOUS

## 2014-03-24 MED ORDER — PROPOFOL 10 MG/ML IV BOLUS
INTRAVENOUS | Status: DC | PRN
  Administered 2014-03-24: 110 mg via INTRAVENOUS

## 2014-03-24 MED ORDER — METOCLOPRAMIDE HCL 5 MG/ML IJ SOLN: 5.0000 mg | Freq: Three times a day (TID) | INTRAMUSCULAR | Status: DC | PRN

## 2014-03-24 MED ORDER — ONDANSETRON HCL 4 MG PO TABS: 4.0000 mg | ORAL_TABLET | Freq: Four times a day (QID) | ORAL | Status: DC | PRN

## 2014-03-24 MED ORDER — MIDAZOLAM HCL 5 MG/5ML IJ SOLN
INTRAMUSCULAR | Status: DC | PRN
  Administered 2014-03-24: 1 mg via INTRAVENOUS

## 2014-03-24 MED ORDER — OXYCODONE HCL 5 MG PO TABS: 5.0000 mg | ORAL_TABLET | Freq: Once | ORAL | Status: DC | PRN

## 2014-03-24 MED ORDER — EPHEDRINE SULFATE 50 MG/ML IJ SOLN
INTRAMUSCULAR | Status: DC | PRN
  Administered 2014-03-24: 10 mg via INTRAVENOUS
  Administered 2014-03-24: 5 mg via INTRAVENOUS

## 2014-03-24 MED ORDER — BUPIVACAINE-EPINEPHRINE (PF) 0.5% -1:200000 IJ SOLN
INTRAMUSCULAR | Status: DC | PRN
  Administered 2014-03-24: 30 mL via PERINEURAL

## 2014-03-24 MED ORDER — ROPIVACAINE HCL 5 MG/ML IJ SOLN
INTRAMUSCULAR | Status: DC | PRN
  Administered 2014-03-24: 20 mL via PERINEURAL

## 2014-03-24 MED ORDER — LIDOCAINE HCL (CARDIAC) 20 MG/ML IV SOLN
INTRAVENOUS | Status: DC | PRN
  Administered 2014-03-24: 60 mg via INTRAVENOUS

## 2014-03-24 MED ORDER — COUMADIN BOOK
Freq: Once | Status: AC
  Administered 2014-03-24: 18:00:00
  Filled 2014-03-24: qty 1

## 2014-03-24 MED ORDER — HYDROMORPHONE HCL 1 MG/ML IJ SOLN: 0.2500 mg | INTRAMUSCULAR | Status: DC | PRN

## 2014-03-24 MED ORDER — MIDAZOLAM HCL 2 MG/2ML IJ SOLN
INTRAMUSCULAR | Status: AC
  Filled 2014-03-24: qty 2

## 2014-03-24 MED ORDER — 0.9 % SODIUM CHLORIDE (POUR BTL) OPTIME
TOPICAL | Status: DC | PRN
  Administered 2014-03-24 (×3): 1000 mL

## 2014-03-24 MED ORDER — FENTANYL CITRATE 0.05 MG/ML IJ SOLN
INTRAMUSCULAR | Status: DC | PRN
  Administered 2014-03-24: 50 ug via INTRAVENOUS
  Administered 2014-03-24 (×2): 25 ug via INTRAVENOUS

## 2014-03-24 MED ORDER — LACTATED RINGERS IV SOLN
INTRAVENOUS | Status: DC
  Administered 2014-03-24: 13:00:00 via INTRAVENOUS

## 2014-03-24 MED ORDER — PRO-STAT SUGAR FREE PO LIQD
30.0000 mL | Freq: Two times a day (BID) | ORAL | Status: DC
  Administered 2014-03-25 – 2014-03-30 (×8): 30 mL via ORAL
  Filled 2014-03-24 (×13): qty 30

## 2014-03-24 MED ORDER — ONDANSETRON HCL 4 MG/2ML IJ SOLN: 4.0000 mg | Freq: Four times a day (QID) | INTRAMUSCULAR | Status: DC | PRN

## 2014-03-24 MED ORDER — METOCLOPRAMIDE HCL 5 MG PO TABS: 5.0000 mg | ORAL_TABLET | Freq: Three times a day (TID) | ORAL | Status: DC | PRN

## 2014-03-24 MED ORDER — NITROGLYCERIN 0.2 MG/HR TD PT24: 0.2000 mg | MEDICATED_PATCH | Freq: Every day | TRANSDERMAL | Status: DC

## 2014-03-24 MED ORDER — LACTATED RINGERS IV SOLN
INTRAVENOUS | Status: DC | PRN
  Administered 2014-03-24: 14:00:00 via INTRAVENOUS

## 2014-03-24 MED ORDER — WARFARIN - PHARMACIST DOSING INPATIENT
Freq: Every day | Status: DC
  Administered 2014-03-24 – 2014-03-29 (×3)

## 2014-03-24 MED ORDER — HEPARIN SODIUM (PORCINE) 5000 UNIT/ML IJ SOLN
5000.0000 [IU] | Freq: Three times a day (TID) | INTRAMUSCULAR | Status: DC
  Administered 2014-03-24 – 2014-03-27 (×8): 5000 [IU] via SUBCUTANEOUS
  Filled 2014-03-24 (×14): qty 1

## 2014-03-24 MED ORDER — WARFARIN VIDEO
Freq: Once | Status: AC
  Administered 2014-03-24: 22:00:00

## 2014-03-24 MED ORDER — INSULIN GLARGINE 100 UNIT/ML ~~LOC~~ SOLN
10.0000 [IU] | Freq: Every day | SUBCUTANEOUS | Status: DC
  Administered 2014-03-24 – 2014-03-26 (×3): 10 [IU] via SUBCUTANEOUS
  Filled 2014-03-24 (×4): qty 0.1

## 2014-03-24 MED ORDER — POTASSIUM CHLORIDE IN NACL 20-0.9 MEQ/L-% IV SOLN
INTRAVENOUS | Status: AC
  Administered 2014-03-24: 18:00:00 via INTRAVENOUS
  Filled 2014-03-24: qty 1000

## 2014-03-24 MED ORDER — FENTANYL CITRATE 0.05 MG/ML IJ SOLN
INTRAMUSCULAR | Status: AC
  Filled 2014-03-24: qty 5

## 2014-03-24 SURGICAL SUPPLY — 51 items
BANDAGE ELASTIC 4 VELCRO ST LF (GAUZE/BANDAGES/DRESSINGS) ×1 IMPLANT
BANDAGE ELASTIC 6 VELCRO ST LF (GAUZE/BANDAGES/DRESSINGS) ×2 IMPLANT
BLADE 10 SAFETY STRL DISP (BLADE) ×2 IMPLANT
BLADE SAW SGTL 83.5X18.5 (BLADE) ×2 IMPLANT
BNDG COHESIVE 6X5 TAN STRL LF (GAUZE/BANDAGES/DRESSINGS) IMPLANT
COVER SURGICAL LIGHT HANDLE (MISCELLANEOUS) ×2 IMPLANT
CUFF TOURNIQUET SINGLE 34IN LL (TOURNIQUET CUFF) ×2 IMPLANT
CUFF TOURNIQUET SINGLE 44IN (TOURNIQUET CUFF) IMPLANT
DRAIN PENROSE 1/2X12 LTX STRL (WOUND CARE) IMPLANT
DRAPE EXTREMITY T 121X128X90 (DRAPE) ×2 IMPLANT
DRAPE INCISE IOBAN 66X45 STRL (DRAPES) ×1 IMPLANT
DRAPE PROXIMA HALF (DRAPES) IMPLANT
DRAPE U-SHAPE 47X51 STRL (DRAPES) IMPLANT
DRSG PAD ABDOMINAL 8X10 ST (GAUZE/BANDAGES/DRESSINGS) ×4 IMPLANT
DURAPREP 26ML APPLICATOR (WOUND CARE) ×2 IMPLANT
ELECT CAUTERY BLADE 6.4 (BLADE) IMPLANT
EVACUATOR 1/8 PVC DRAIN (DRAIN) IMPLANT
GAUZE SPONGE 4X4 12PLY STRL (GAUZE/BANDAGES/DRESSINGS) ×1 IMPLANT
GAUZE XEROFORM 5X9 LF (GAUZE/BANDAGES/DRESSINGS) ×1 IMPLANT
GLOVE BIO SURGEON STRL SZ7 (GLOVE) ×4 IMPLANT
GLOVE BIOGEL PI IND STRL 7.5 (GLOVE) IMPLANT
GLOVE BIOGEL PI INDICATOR 7.5 (GLOVE) ×1
GLOVE SURG ORTHO 8.0 STRL STRW (GLOVE) ×2 IMPLANT
GOWN STRL REUS W/ TWL LRG LVL3 (GOWN DISPOSABLE) ×1 IMPLANT
GOWN STRL REUS W/ TWL XL LVL3 (GOWN DISPOSABLE) ×1 IMPLANT
GOWN STRL REUS W/TWL LRG LVL3 (GOWN DISPOSABLE) ×2
GOWN STRL REUS W/TWL XL LVL3 (GOWN DISPOSABLE) ×2
KIT ROOM TURNOVER OR (KITS) ×2 IMPLANT
MANIFOLD NEPTUNE II (INSTRUMENTS) ×1 IMPLANT
NS IRRIG 1000ML POUR BTL (IV SOLUTION) ×2 IMPLANT
PACK GENERAL/GYN (CUSTOM PROCEDURE TRAY) ×2 IMPLANT
PAD ARMBOARD 7.5X6 YLW CONV (MISCELLANEOUS) ×2 IMPLANT
PADDING CAST ABS 4INX4YD NS (CAST SUPPLIES) ×2
PADDING CAST ABS COTTON 4X4 ST (CAST SUPPLIES) ×2 IMPLANT
SPONGE LAP 18X18 X RAY DECT (DISPOSABLE) IMPLANT
STAPLER VISISTAT 35W (STAPLE) ×2 IMPLANT
STOCKINETTE IMPERVIOUS LG (DRAPES) ×2 IMPLANT
SUT PDS AB 1 CT  36 (SUTURE)
SUT PDS AB 1 CT 36 (SUTURE) IMPLANT
SUT SILK 2 0 (SUTURE) ×2
SUT SILK 2 0 SH CR/8 (SUTURE) ×2 IMPLANT
SUT SILK 2-0 18XBRD TIE 12 (SUTURE) ×1 IMPLANT
SUT VIC AB 0 CT1 27 (SUTURE) ×10
SUT VIC AB 0 CT1 27XBRD ANBCTR (SUTURE) ×1 IMPLANT
SUT VIC AB 2-0 CT1 27 (SUTURE) ×8
SUT VIC AB 2-0 CT1 TAPERPNT 27 (SUTURE) ×4 IMPLANT
SWAB COLLECTION DEVICE MRSA (MISCELLANEOUS) IMPLANT
TOWEL OR 17X24 6PK STRL BLUE (TOWEL DISPOSABLE) ×2 IMPLANT
TOWEL OR 17X26 10 PK STRL BLUE (TOWEL DISPOSABLE) ×2 IMPLANT
TUBE ANAEROBIC SPECIMEN COL (MISCELLANEOUS) IMPLANT
WATER STERILE IRR 1000ML POUR (IV SOLUTION) ×1 IMPLANT

## 2014-03-24 NOTE — Brief Op Note (Signed)
03/20/2014 - 03/24/2014  4:19 PM  PATIENT:  Jordan [name]  73 y.o. male  PRE-OPERATIVE DIAGNOSIS:  Diabetic Foot Ulcer  POST-OPERATIVE DIAGNOSIS:  Diabetic Foot Ulcer  PROCEDURE:  Procedure(s): AMPUTATION BELOW KNEE  SURGEON:  Surgeon(s): Cammy CopaGregory Scott Shalaunda Weatherholtz, MD  ASSISTANT: none  ANESTHESIA:   general  EBL: 50 ml    Total I/O In: 700 [I.V.:700] Out: 100 [Blood:100]  BLOOD ADMINISTERED: none  DRAINS: none   LOCAL MEDICATIONS USED:  none  SPECIMEN:  No Specimen  COUNTS:  YES  TOURNIQUET:  * Missing tourniquet times found for documented tourniquets in log:  [id number] *  DICTATION: .Other Dictation: Dictation Number [id number]  PLAN OF CARE: Admit to inpatient   PATIENT DISPOSITION:  PACU - hemodynamically stable

## 2014-03-24 NOTE — Progress Notes (Signed)
ANTICOAGULATION CONSULT NOTE - Initial Consult  Pharmacy Consult for coumadin Indication: VTE prophylaxis  Allergies  Allergen Reactions  . Cilostazol Other (See Comments)    Feels like feet on fire    Patient Measurements: Height: 5\' 9"  (175.3 cm) Weight: 218 lb 0.6 oz (98.9 kg) IBW/kg (Calculated) : 70.7   Vital Signs: Temp: 98.8 F (37.1 C) (12/24 1714) Temp Source: Axillary (12/24 1714) BP: 120/60 mmHg (12/24 1714) Pulse Rate: 75 (12/24 1714)  Labs:  Recent Labs  03/22/14 0430 03/23/14 0545 03/24/14 0640  HGB 8.8*  --  8.7*  HCT 28.5*  --  27.7*  PLT 403*  --  464*  CREATININE 1.17 1.28 1.24    Estimated Creatinine Clearance: 61.5 mL/min (by C-G formula based on Cr of 1.24).   Medical History: Past Medical History  Diagnosis Date  . CAD (coronary artery disease)     a. s/p IMI in past tx with POBA and cath 6 mos later with occluded RCA;  b. h/o Taxus DES to LAD and CFX;  c.  cath 1/10: LM ok, mLAD 40-50%, LAD stent ok, D2 tandem 60-70%, pCFX 30%, mAVCFX stent ok, pRCA 40%, RV 90%, RV marginal 90%, dRCA filled L-R collats tx medically  . Carotid stenosis     dopplers 5/11: 40-59% bilat  . DM2 (diabetes mellitus, type 2)   . HTN (hypertension)   . HLD (hyperlipidemia)   . Diabetes mellitus     Medications:  Prescriptions prior to admission  Medication Sig Dispense Refill Last Dose  . acetaminophen (TYLENOL) 325 MG tablet Take 2 tablets (650 mg total) by mouth every 6 (six) hours as needed for mild pain (or Fever >/= 101).   03/20/2014 at Unknown time  . albuterol (PROVENTIL) (2.5 MG/3ML) 0.083% nebulizer solution Take 3 mLs (2.5 mg total) by nebulization every 6 (six) hours as needed for wheezing. 75 mL 12 Unk  . amLODipine (NORVASC) 10 MG tablet Take 10 mg by mouth daily.   03/20/2014 at Unknown time  . aspirin 81 MG chewable tablet Chew 81 mg by mouth daily.   03/20/2014 at Unknown time  . docusate sodium 100 MG CAPS Take 100 mg by mouth 2 (two) times  daily. 10 capsule 0 03/20/2014 at Unknown time  . feeding supplement, GLUCERNA SHAKE, (GLUCERNA SHAKE) LIQD Take 237 mLs by mouth 2 (two) times daily between meals.  0 03/20/2014 at Unknown time  . insulin aspart (NOVOLOG) 100 UNIT/ML injection Inject 0-20 Units into the skin 3 (three) times daily with meals. (Patient taking differently: Inject 0-10 Units into the skin 3 (three) times daily with meals. BCG 150-200= 2 units 201-250= 4 units 251-300= 6 units 301-350= 8 units 351-400= 10 units) 10 mL 11 03/19/2014 at Unknown time  . insulin glargine (LANTUS) 100 UNIT/ML injection Inject 0.25 mLs (25 Units total) into the skin daily. 10 mL 11 03/19/2014 at Unknown time  . insulin glargine (LANTUS) 100 UNIT/ML injection Inject 0.2 mLs (20 Units total) into the skin at bedtime. (Patient taking differently: Inject 20-25 Units into the skin at bedtime. ) 10 mL 11 03/19/2014 at Unknown time  . isosorbide mononitrate (IMDUR) 30 MG 24 hr tablet Take 30 mg by mouth daily.   03/20/2014 at Unknown time  . losartan (COZAAR) 100 MG tablet Take 100 mg by mouth daily.   03/20/2014 at Unknown time  . metoprolol (TOPROL-XL) 100 MG 24 hr tablet Take 100 mg by mouth 2 (two) times daily.     03/20/2014 at  1000  . nitroGLYCERIN (NITRODUR - DOSED IN MG/24 HR) 0.2 mg/hr patch Place 1 patch onto the skin daily.  3 03/20/2014 at Unknown time  . nitroGLYCERIN (NITROSTAT) 0.4 MG SL tablet Place 0.4 mg under the tongue every 5 (five) minutes as needed for chest pain.    Unk  . ondansetron (ZOFRAN) 4 MG tablet Take 1 tablet (4 mg total) by mouth every 6 (six) hours as needed for nausea. 20 tablet 0 03/19/2014 at Unknown time  . oxyCODONE-acetaminophen (PERCOCET/ROXICET) 5-325 MG per tablet Take 1-2 tablets by mouth every 6 (six) hours as needed for moderate pain or severe pain. 30 tablet 0 03/19/2014 at Unknown time  . rosuvastatin (CRESTOR) 40 MG tablet Take 40 mg by mouth every evening.   03/19/2014 at Unknown time  . silver  sulfADIAZINE (SILVADENE) 1 % cream Apply 1 application topically daily.  3 03/20/2014 at Unknown time  . Amino Acids-Protein Hydrolys (FEEDING SUPPLEMENT, PRO-STAT SUGAR FREE 64,) LIQD Take 30 mLs by mouth 2 (two) times daily between meals. 900 mL 0 Taking  . mirtazapine (REMERON) 7.5 MG tablet Take 1 tablet (7.5 mg total) by mouth at bedtime.   Taking  . pantoprazole (PROTONIX) 40 MG tablet Take 1 tablet (40 mg total) by mouth daily.   Taking    Assessment: 73 yo man s/p BKA to start coumadin for VTE px. Goal of Therapy:  INR 2-3 Monitor platelets by anticoagulation protocol: Yes   Plan:  Coumadin 7.5 mg po today Daily PT/INR Monitor for bleeding  Thanks for allowing pharmacy to be a part of this patient's care.  Talbert CageLora Anav Lammert, PharmD Clinical Pharmacist, [phone]  03/24/2014,5:31 PM

## 2014-03-24 NOTE — Op Note (Signed)
NAME:  Jordan [name], Jordan [name]               ACCOUNT NO.:  [id number]  MEDICAL RECORD NO.:  [id number]  LOCATION:  [id number]                        FACILITY:  [facility]  PHYSICIAN:  Burnard BuntingG. Scott Blayze Haen, M.D.    DATE OF BIRTH:  [birth year]  DATE OF PROCEDURE: DATE OF DISCHARGE:                              OPERATIVE REPORT   PREOPERATIVE DIAGNOSIS:  Left foot osteomyelitis.  POSTOPERATIVE DIAGNOSIS:  Left foot osteomyelitis.  PROCEDURE:  Left below-knee amputation.  SURGEON:  Burnard BuntingG. Scott Delmont Prosch, MD  ASSISTANT:  None.  ANESTHESIA:  General.  INDICATIONS:  [name] is a patient with sepsis and left foot infection after failed transmetatarsal amputation.  He presents now for operative management of foot infection after explanation of risks and benefits.  PROCEDURE IN DETAIL:  The patient was brought to the operating room, where general anesthetic was induced.  Perioperative antibiotics were maintained.  Left leg above the ankle was prescrubbed with alcohol and Betadine, allowed to air dry, prepped with DuraPrep solution, draped in sterile manner.  A handbreadth and half of below the tibial tubercle, step cut incision was made.  Initial incision was made, actually 2/3rd of this circumference at the level of the mid calf and then the flap was created distally measuring about twice as long as the anterior line. Venous bleeding was encountered and tourniquet was thus subsequently inflated without exsanguination.  Anterior compartment was divided. Anterior and posterior neurovascular bundles were identified.  Clamps suture ligatured x2 with 2-0 silk ties.  Amputation knife was then used to complete the amputation.  Tourniquet was released at this time, bleeding points were controlled using electrocautery.  Thorough irrigation was performed.  Tibia was cut in a step-cut fashion.  Fibula was cut about 2 cm proximal to the tibial cut.  Good marginal blood flow was observed to the posterior flap into the anterior  tissues.  Thorough irrigation with 3 L of irrigating solution was performed and then the incision was closed by reapproximating the fascia using 0 Vicryl suture, 2-0 Vicryl suture, and skin staples.  Good closure was obtained.  The tibial nerve was pulled, cut and latter retracted posteriorly.  The patient tolerated the procedure well without immediate complications. Bulky dressing and cast were applied.    Burnard BuntingG. Scott Jayland Null, M.D.    GSD/MEDQ  D:  03/24/2014  T:  03/24/2014  Job:  [id number]

## 2014-03-24 NOTE — Anesthesia Postprocedure Evaluation (Signed)
  Anesthesia Post-op Note  Patient: Jordan [name]  Procedure(s) Performed: Procedure(s): AMPUTATION BELOW KNEE (Left)  Patient Location: PACU  Anesthesia Type:General and GA combined with regional for post-op pain  Level of Consciousness: awake, sedated and patient cooperative  Airway and Oxygen Therapy: Patient Spontanous Breathing  Post-op Pain: none  Post-op Assessment: Post-op Vital signs reviewed, Patient's Cardiovascular Status Stable, Respiratory Function Stable, Patent Airway, No signs of Nausea or vomiting and Pain level controlled  Post-op Vital Signs: stable  Last Vitals:  Filed Vitals:   03/24/14 1630  BP:   Pulse: 77  Temp:   Resp: 20    Complications: No apparent anesthesia complications

## 2014-03-24 NOTE — Progress Notes (Signed)
[facility]  Date of Admission:  03/20/2014  Antibiotics: Cefazolin day 2 Vancomycin stopped  Subjective: Evaluated by ortho  Objective: Temp:  [98.5 F (36.9 C)-99.3 F (37.4 C)] 99.3 F (37.4 C) (12/24 0608) Pulse Rate:  [76-81] 79 (12/24 0952) Resp:  [16-17] 16 (12/24 0608) BP: (116-149)/(55-63) 134/58 mmHg (12/24 0952) SpO2:  [80 %-98 %] 98 % (12/24 0608)  In surgery  Lab Results Lab Results  Component Value Date   WBC 10.4 03/24/2014   HGB 8.7* 03/24/2014   HCT 27.7* 03/24/2014   MCV 79.4 03/24/2014   PLT 464* 03/24/2014    Lab Results  Component Value Date   CREATININE 1.24 03/24/2014   BUN 12 03/24/2014   NA 131* 03/24/2014   K 3.0* 03/24/2014   CL 95* 03/24/2014   CO2 25 03/24/2014    Lab Results  Component Value Date   ALT 25 03/21/2014   AST 32 03/21/2014   ALKPHOS 117 03/21/2014   BILITOT 0.4 03/21/2014      Microbiology: Recent Results (from the past 240 hour(s))  Culture, blood (routine x 2)     Status: None   Collection Time: 03/20/14  1:30 PM  Result Value Ref Range Status   Specimen Description BLOOD RIGHT WRIST  Final   Special Requests BOTTLES DRAWN AEROBIC AND ANAEROBIC 5CCS  Final   Culture  Setup Time   Final    03/20/2014 23:45 Performed at [facility]    Culture   Final    STAPHYLOCOCCUS AUREUS Note: RIFAMPIN AND GENTAMICIN SHOULD NOT BE USED AS SINGLE DRUGS FOR TREATMENT OF STAPH INFECTIONS. Note: Gram Stain Report Called to,Read Back By and Verified With: CAROL HUCKABEE 03/21/14 1025 BY SMITHERSJ Performed at [facility]    Report Status 03/23/2014 FINAL  Final   Organism ID, Bacteria STAPHYLOCOCCUS AUREUS  Final      Susceptibility   Staphylococcus aureus - MIC*    CLINDAMYCIN <=0.25 SENSITIVE Sensitive     ERYTHROMYCIN <=0.25 SENSITIVE Sensitive     GENTAMICIN <=0.5 SENSITIVE Sensitive     LEVOFLOXACIN <=0.12 SENSITIVE Sensitive     OXACILLIN 0.5 SENSITIVE Sensitive       PENICILLIN >=0.5 RESISTANT Resistant     RIFAMPIN <=0.5 SENSITIVE Sensitive     TRIMETH/SULFA <=10 SENSITIVE Sensitive     VANCOMYCIN <=0.5 SENSITIVE Sensitive     TETRACYCLINE >=16 RESISTANT Resistant     MOXIFLOXACIN <=0.25 SENSITIVE Sensitive     * STAPHYLOCOCCUS AUREUS  Culture, blood (routine x 2)     Status: None   Collection Time: 03/20/14  2:11 PM  Result Value Ref Range Status   Specimen Description BLOOD LEFT ANTECUBITAL  Final   Special Requests BOTTLES DRAWN AEROBIC AND ANAEROBIC 3CCS  Final   Culture  Setup Time   Final    03/20/2014 23:45 Performed at [facility]    Culture   Final    STAPHYLOCOCCUS AUREUS Note: SUSCEPTIBILITIES PERFORMED ON PREVIOUS CULTURE WITHIN THE LAST 5 DAYS. Note: Gram Stain Report Called to,Read Back By and Verified With: CAROL HUCKABEE 03/21/14 1025 BY SMITHERSJ Culture results may be compromised due to an inadequate volume of blood received in culture bottles. Performed at [facility]    Report Status 03/23/2014 FINAL  Final  Urine culture     Status: None   Collection Time: 03/20/14  3:49 PM  Result Value Ref Range Status   Specimen Description URINE, CLEAN CATCH  Final  Special Requests Normal  Final   Culture  Setup Time   Final    03/20/2014 21:30 Performed at [facility]    Colony Count NO GROWTH Performed at [facility]   Final   Culture NO GROWTH Performed at [facility]   Final   Report Status 03/21/2014 FINAL  Final  Culture, blood (routine x 2)     Status: None (Preliminary result)   Collection Time: 03/23/14  5:45 AM  Result Value Ref Range Status   Specimen Description BLOOD LEFT FOREARM  Final   Special Requests BOTTLES DRAWN AEROBIC AND ANAEROBIC 10CC  Final   Culture  Setup Time   Final    03/23/2014 11:23 Performed at [facility]    Culture   Final           BLOOD CULTURE RECEIVED NO GROWTH TO DATE CULTURE WILL BE HELD FOR 5 DAYS BEFORE ISSUING A FINAL  NEGATIVE REPORT Performed at [facility]    Report Status PENDING  Incomplete  Culture, blood (routine x 2)     Status: None (Preliminary result)   Collection Time: 03/23/14  6:10 AM  Result Value Ref Range Status   Specimen Description BLOOD LEFT HAND  Final   Special Requests BOTTLES DRAWN AEROBIC ONLY 10CC  Final   Culture  Setup Time   Final    03/23/2014 11:22 Performed at [facility]    Culture   Final           BLOOD CULTURE RECEIVED NO GROWTH TO DATE CULTURE WILL BE HELD FOR 5 DAYS BEFORE ISSUING A FINAL NEGATIVE REPORT Performed at [facility]    Report Status PENDING  Incomplete  Surgical pcr screen     Status: None   Collection Time: 03/24/14  5:44 AM  Result Value Ref Range Status   MRSA, PCR NEGATIVE NEGATIVE Final   Staphylococcus aureus NEGATIVE NEGATIVE Final    Comment:        The Xpert SA Assay (FDA approved for NASAL specimens in patients over 73 years of age), is one component of a comprehensive surveillance program.  Test performance has been validated by [facility] for patients greater than or equal to 73 year old. It is not intended to diagnose infection nor to guide or monitor treatment.     Studies/Results: Dg Shoulder Right  03/22/2014   CLINICAL DATA:  Right shoulder pain for 3 days, no known injury, bacteremia, initial encounter  EXAM: RIGHT SHOULDER - 2+ VIEW  COMPARISON:  None.  FINDINGS: Mild degenerative changes of the acromioclavicular joint are seen. No fracture or dislocation is seen. No bony erosions are noted.  IMPRESSION: No acute abnormality seen.   Electronically Signed   By: Alcide CleverMark  Lukens M.D.   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 Fever and chills. Left foot pain and swelling.  EXAM: MRI OF THE LEFT FOREFOOT WITHOUT CONTRAST  TECHNIQUE: Multiplanar, multisequence MR  imaging was performed. No intravenous contrast was administered.  COMPARISON:  CT scan of the left foot stress as CT scan of the left hindfoot 03/20/2014.  FINDINGS: There appear to be soft tissue wounds on the medial and lateral side of the patient's stump. Soft tissue edema about the patient's stump appears worse medially. There is a subcutaneous air and fluid collection in the  medial plantar soft tissues measuring approximately 3.8 cm long by 1 cm craniocaudal by 3.2 cm transverse highly suspicious for abscess. There appears to be a second fluid collection extending into the abductor hallucis muscle measuring 2.1 cm craniocaudal by 1.2 cm transverse by 5.3 cm long. The muscle is markedly atrophic.  Marrow edema is seen throughout the cuboid. Bone fragment likely representing the remnant of the lateral cuneiform is also markedly edematous. Multiple small bony fragments in the stump more medially also demonstrate edema. Very mild edema is seen in the calcaneus at the calcaneocuboid joint with only trace amount of joint fluid present.  IMPRESSION: Findings most consistent with cellulitis the patient stump with marrow signal change consistent with osteomyelitis in the cuboid and remnants of the cuneiforms.  Air and fluid collection in the medial plantar subcutaneous tissues is highly suspicious for abscess.  Fluid tracking in the flexor hallucis muscle belly is also worrisome for abscess although edema could be secondary to denervation atrophy.   Electronically Signed   By: Drusilla Kanner M.D.   On: 03/22/2014 15:19    Assessment/Plan:  1) osteomyelitis with abscess - MRI of foot c/w osteomyelitis in cuboid and cuneiforms, abscess medial plantar and possibly flexor hallucis.  2/2 blood cultures with MSSA.  Repeat blood cultures remain negative.  TTE without obvious vegetation.  In surgery today for debridement.  Opted to not get BKA therefore will likely need long term antibiotics, depending on extent of  surgery.  Therefore can defer TEE.   Staci Righter, MD [facility] [email or web address] [phone] pager   [phone] cell 03/24/2014, 2:09 PM

## 2014-03-24 NOTE — Anesthesia Preprocedure Evaluation (Addendum)
Anesthesia Evaluation  Patient identified by MRN, date of birth, ID band Patient awake    Reviewed: Allergy & Precautions, H&P , NPO status , Patient's Chart, lab work & pertinent test results  History of Anesthesia Complications Negative for: history of anesthetic complications  Airway Mallampati: III  TM Distance: >3 FB Neck ROM: Limited    Dental   Pulmonary shortness of breath, former smoker,  breath sounds clear to auscultation  Pulmonary exam normal       Cardiovascular hypertension, + CAD, + Past MI and + Peripheral Vascular Disease + Valvular Problems/Murmurs AS Rhythm:Regular + Systolic murmurs    Neuro/Psych negative neurological ROS  negative psych ROS   GI/Hepatic negative GI ROS, Neg liver ROS,   Endo/Other  diabetes, Type 2, Insulin DependentMorbid obesity  Renal/GU Renal InsufficiencyRenal disease     Musculoskeletal   Abdominal Normal abdominal exam  (+)   Peds  Hematology negative hematology ROS (+) anemia ,   Anesthesia Other Findings   Reproductive/Obstetrics                            Anesthesia Physical Anesthesia Plan  ASA: III  Anesthesia Plan: General and Regional   Post-op Pain Management: MAC Combined w/ Regional for Post-op pain   Induction: Intravenous  Airway Management Planned: LMA and Oral ETT  Additional Equipment: None  Intra-op Plan:   Post-operative Plan: Extubation in OR  Informed Consent: I have reviewed the patients History and Physical, chart, labs and discussed the procedure including the risks, benefits and alternatives for the proposed anesthesia with the patient or authorized representative who has indicated his/her understanding and acceptance.   Dental advisory given  Plan Discussed with: CRNA, Anesthesiologist and Surgeon  Anesthesia Plan Comments:        Anesthesia Quick Evaluation

## 2014-03-24 NOTE — Progress Notes (Signed)
Plan lbka today Npo Hep stopped

## 2014-03-24 NOTE — Anesthesia Procedure Notes (Signed)
Anesthesia Regional Block:  Popliteal block  Pre-Anesthetic Checklist: ,, timeout performed, Correct Patient, Correct Site, Correct Laterality, Correct Procedure, Correct Position, site marked, Risks and benefits discussed,  Surgical consent,  Pre-op evaluation,  At surgeon's request and post-op pain management  Laterality: Lower and Left  Prep: chloraprep       Needles:  Injection technique: Single-shot  Needle Type: Echogenic Stimulator Needle          Additional Needles:  Procedures: ultrasound guided (picture in chart) and nerve stimulator Popliteal block  Nerve Stimulator or Paresthesia:  Response: plantar, 0.5 mA,   Additional Responses:   Narrative:  Injection made incrementally with aspirations every 5 mL.  Performed by: Personally  Anesthesiologist: Aseneth Hack, CHRIS  Additional Notes: H+P and labs reviewed, risks and benefits discussed with patient, procedure tolerated well without complications   Anesthesia Regional Block:  Femoral nerve block  Pre-Anesthetic Checklist: ,, timeout performed, Correct Patient, Correct Site, Correct Laterality, Correct Procedure,, site marked, risks and benefits discussed, Surgical consent,  Pre-op evaluation,  At surgeon's request and post-op pain management  Laterality: Lower and Left  Prep: chloraprep       Needles:  Injection technique: Single-shot  Needle Type: Echogenic Stimulator Needle          Additional Needles:  Procedures: ultrasound guided (picture in chart) and nerve stimulator Femoral nerve block  Nerve Stimulator or Paresthesia:  Response: quad, 0.5 mA,   Additional Responses:   Narrative:  Injection made incrementally with aspirations every 5 mL.  Performed by: Personally  Anesthesiologist: Jaisen Wiltrout, CHRIS  Additional Notes: H+P and labs reviewed, risks and benefits discussed with patient, procedure tolerated well without complications

## 2014-03-24 NOTE — Progress Notes (Signed)
PROGRESS NOTE  Jordan [name] XBJ:[id number] DOB: 1940-07-14 DOA: 03/20/2014 PCP: [name], MD  HPI/Recap of past 36 hours: 73 year old male with past history of poorly controlled diabetes mellitus with secondary peripheral vascular disease and CAD status post left arterial bypass in early November followed by partial foot amputation who has been following with vascular and orthopedic surgery and then presented to the emergency room after 1 week of overall malaise and 2 days of fevers, chills and elevated blood sugars as well as some wound dehiscence. Patient presented to the emergency room on 12/20 and noted to be afebrile with no leukocytosis, but foot x-ray noted area of lucency and possible osteomyelitis. Patient's admitted to the hospitalist service, started on broad-spectrum antibiotics.   Patient seen by orthopedist surgery who felt like the findings on x-ray were more related to chronic findings with no osteomyelitis. No abscess, cellulitis or drainage was noted. They felt the ulcerations were ischemic in patients be continued on nitroglycerin patch plus daily dressings. However this afternoon, patient's blood cultures have come back positive for 4/4 bottles of gram-positive cocci in clusters, which came back as staph aureus, not found resistant only to penicillin and tetracycline.  ID consulted & 2D echo ordered.  MRI of foot confirms abscess + osteomyelitis.  Orthopedic surgery followed up on 12/23 with plans to take patient to the operating room for left BKA on 12/24.  Patient seen for surgery. Doing okay, tired. Ready to have this done.   Assessment/Plan: Principal Problem:   SIRS (systemic inflammatory response syndrome) in the setting of with secondary diabetic foot ulcer and history of recent midfoot amputation w/osteomyelitis: Continue vancomycin. For BKA today  Chronic diastolic heart failure: Noted on echocardiogram. BNP stable. Already on ARB plus beta blocker  Active  Problems:   DM (diabetes mellitus), type 2, uncontrolled, periph vascular complic: A1c elevated, in the context of osteomyelitis. With DKA, this will greatly improved. In addition, currently have reduced his dose of Lantus and sliding scale every 4 hours while he is nothing by mouth   Hyperlipidemia: Continue statin   Essential hypertension: Blood pressures overall stable  Mild aortic stenosis: Noted on echocardiogram    Protein-calorie malnutrition, severe: Patient meets criteria for severe malnutrition of acute illness. Seen by nutrition. Recommending boost twice a day during hospitalization  Shoulder pain: xrays done, no evidence of fracture or abnormal lucency  Code Status: Full code   Family Communication: Wife at the bedside   Disposition Plan: Continue in-hospital, waiting for Ortho follow-up disposition   Consultants:  Orthopedic surgery   ID  Procedures:  None   Antibiotics:  IV Rocephin 12/20-12/21  IV vancomycin 12/20-12/23  IV Cipro 12/20-12/21  IV Ancef 12/23-present   Objective: BP 129/58 mmHg  Pulse 77  Temp(Src) 98.4 F (36.9 C) (Oral)  Resp 20  Ht 5\' 9"  (1.753 m)  Wt 98.9 kg (218 lb 0.6 oz)  BMI 32.18 kg/m2  SpO2 100%  Intake/Output Summary (Last 24 hours) at 03/24/14 1633 Last data filed at 03/24/14 1610  Gross per 24 hour  Intake    700 ml  Output    100 ml  Net    600 ml   Filed Weights   03/20/14 1316 03/20/14 1702 03/22/14 0454  Weight: 91.173 kg (201 lb) 94.7 kg (208 lb 12.4 oz) 98.9 kg (218 lb 0.6 oz)    Exam:   General:  Alert and oriented 3, no acute distress  Cardiovascular: Regular rate and rhythm, S1-S2, 2/6 systolic ejection murmur  Respiratory: Clear to auscultation bilaterally  Abdomen: Soft, nontender, nondistended, positive bowel sounds   Musculoskeletal: Left foot wrapped, status post midfoot amputation  Right shoulder-limited ROM  Data Reviewed: Basic Metabolic Panel:  Recent Labs Lab 03/20/14 1320  03/21/14 0510 03/22/14 0430 03/23/14 0545 03/24/14 0640  NA 131* 131* 130*  --  131*  K 4.6 4.6 3.7  --  3.0*  CL 91* 93* 97  --  95*  CO2 26 23 25   --  25  GLUCOSE 401* 325* 309*  --  164*  BUN 11 11 11   --  12  CREATININE 1.10 1.05 1.17 1.28 1.24  CALCIUM 9.1 8.7 8.3*  --  8.3*   Liver Function Tests:  Recent Labs Lab 03/20/14 1839 03/21/14 0510  AST  --  32  ALT  --  25  ALKPHOS  --  117  BILITOT  --  0.4  PROT  --  8.3  ALBUMIN 2.4* 2.3*   No results for input(s): LIPASE, AMYLASE in the last 168 hours. No results for input(s): AMMONIA in the last 168 hours. CBC:  Recent Labs Lab 03/20/14 1320 03/21/14 0510 03/22/14 0430 03/24/14 0640  WBC 11.5* 12.0* 10.5 10.4  NEUTROABS 8.5*  --   --   --   HGB 9.4* 9.7* 8.8* 8.7*  HCT 29.9* 29.9* 28.5* 27.7*  MCV 80.4 81.3 79.6 79.4  PLT 299 384 403* 464*   Cardiac Enzymes:    Recent Labs Lab 03/20/14 1320  TROPONINI <0.30   BNP (last 3 results) No results for input(s): PROBNP in the last 8760 hours. CBG:  Recent Labs Lab 03/24/14 0342 03/24/14 0740 03/24/14 1153 03/24/14 1404 03/24/14 1630  GLUCAP 219* 171* 209* 176* 191*    Recent Results (from the past 240 hour(s))  Culture, blood (routine x 2)     Status: None   Collection Time: 03/20/14  1:30 PM  Result Value Ref Range Status   Specimen Description BLOOD RIGHT WRIST  Final   Special Requests BOTTLES DRAWN AEROBIC AND ANAEROBIC 5CCS  Final   Culture  Setup Time   Final    03/20/2014 23:45 Performed at [facility]    Culture   Final    STAPHYLOCOCCUS AUREUS Note: RIFAMPIN AND GENTAMICIN SHOULD NOT BE USED AS SINGLE DRUGS FOR TREATMENT OF STAPH INFECTIONS. Note: Gram Stain Report Called to,Read Back By and Verified With: CAROL HUCKABEE 03/21/14 1025 BY SMITHERSJ Performed at [facility]    Report Status 03/23/2014 FINAL  Final   Organism ID, Bacteria STAPHYLOCOCCUS AUREUS  Final      Susceptibility   Staphylococcus aureus -  MIC*    CLINDAMYCIN <=0.25 SENSITIVE Sensitive     ERYTHROMYCIN <=0.25 SENSITIVE Sensitive     GENTAMICIN <=0.5 SENSITIVE Sensitive     LEVOFLOXACIN <=0.12 SENSITIVE Sensitive     OXACILLIN 0.5 SENSITIVE Sensitive     PENICILLIN >=0.5 RESISTANT Resistant     RIFAMPIN <=0.5 SENSITIVE Sensitive     TRIMETH/SULFA <=10 SENSITIVE Sensitive     VANCOMYCIN <=0.5 SENSITIVE Sensitive     TETRACYCLINE >=16 RESISTANT Resistant     MOXIFLOXACIN <=0.25 SENSITIVE Sensitive     * STAPHYLOCOCCUS AUREUS  Culture, blood (routine x 2)     Status: None   Collection Time: 03/20/14  2:11 PM  Result Value Ref Range Status   Specimen Description BLOOD LEFT ANTECUBITAL  Final   Special Requests BOTTLES DRAWN AEROBIC AND ANAEROBIC 3CCS  Final   Culture  Setup Time  Final    03/20/2014 23:45 Performed at [facility]    Culture   Final    STAPHYLOCOCCUS AUREUS Note: SUSCEPTIBILITIES PERFORMED ON PREVIOUS CULTURE WITHIN THE LAST 5 DAYS. Note: Gram Stain Report Called to,Read Back By and Verified With: CAROL HUCKABEE 03/21/14 1025 BY SMITHERSJ Culture results may be compromised due to an inadequate volume of blood received in culture bottles. Performed at [facility]    Report Status 03/23/2014 FINAL  Final  Urine culture     Status: None   Collection Time: 03/20/14  3:49 PM  Result Value Ref Range Status   Specimen Description URINE, CLEAN CATCH  Final   Special Requests Normal  Final   Culture  Setup Time   Final    03/20/2014 21:30 Performed at [facility]    Colony Count NO GROWTH Performed at [facility]   Final   Culture NO GROWTH Performed at [facility]   Final   Report Status 03/21/2014 FINAL  Final  Culture, blood (routine x 2)     Status: None (Preliminary result)   Collection Time: 03/23/14  5:45 AM  Result Value Ref Range Status   Specimen Description BLOOD LEFT FOREARM  Final   Special Requests BOTTLES DRAWN AEROBIC AND ANAEROBIC 10CC   Final   Culture  Setup Time   Final    03/23/2014 11:23 Performed at [facility]    Culture   Final           BLOOD CULTURE RECEIVED NO GROWTH TO DATE CULTURE WILL BE HELD FOR 5 DAYS BEFORE ISSUING A FINAL NEGATIVE REPORT Performed at [facility]    Report Status PENDING  Incomplete  Culture, blood (routine x 2)     Status: None (Preliminary result)   Collection Time: 03/23/14  6:10 AM  Result Value Ref Range Status   Specimen Description BLOOD LEFT HAND  Final   Special Requests BOTTLES DRAWN AEROBIC ONLY 10CC  Final   Culture  Setup Time   Final    03/23/2014 11:22 Performed at [facility]    Culture   Final           BLOOD CULTURE RECEIVED NO GROWTH TO DATE CULTURE WILL BE HELD FOR 5 DAYS BEFORE ISSUING A FINAL NEGATIVE REPORT Performed at [facility]    Report Status PENDING  Incomplete  Surgical pcr screen     Status: None   Collection Time: 03/24/14  5:44 AM  Result Value Ref Range Status   MRSA, PCR NEGATIVE NEGATIVE Final   Staphylococcus aureus NEGATIVE NEGATIVE Final    Comment:        The Xpert SA Assay (FDA approved for NASAL specimens in patients over 32 years of age), is one component of a comprehensive surveillance program.  Test performance has been validated by [facility] for patients greater than or equal to 6 year old. It is not intended to diagnose infection nor to guide or monitor treatment.      Studies: No results found.  Scheduled Meds: . amLODipine  10 mg Oral Daily  . aspirin  81 mg Oral Daily  .  ceFAZolin (ANCEF) IV  2 g Intravenous 3 times per day  . docusate sodium  100 mg Oral BID  . feeding supplement (GLUCERNA SHAKE)  237 mL Oral BID BM  . insulin aspart  0-15 Units Subcutaneous 6 times per day  . insulin glargine  10 Units Subcutaneous QHS  .  isosorbide mononitrate  30 mg Oral Daily  . lactose free nutrition  237 mL Oral BID WC  . losartan  100 mg Oral Daily  . metoprolol succinate  100  mg Oral BID  . nitroGLYCERIN  0.2 mg Transdermal Daily  . rosuvastatin  40 mg Oral QPM  . silver sulfADIAZINE  1 application Topical Daily    Continuous Infusions: . lactated ringers 10 mL/hr at 03/24/14 1313     Time spent: 25 minutes   Hollice EspyKRISHNAN,Rakim Moone K  [facility] Pager [phone] . If 7PM-7AM, please contact night-coverage at [email or web address], password [email or web address] 03/24/2014, 4:33 PM  LOS: 4 days

## 2014-03-24 NOTE — Transfer of Care (Signed)
Immediate Anesthesia Transfer of Care Note  Patient: Jordan [name]  Procedure(s) Performed: Procedure(s): AMPUTATION BELOW KNEE (Left)  Patient Location: PACU  Anesthesia Type:General  Level of Consciousness: awake, alert  and oriented  Airway & Oxygen Therapy: Patient Spontanous Breathing and Patient connected to face mask oxygen  Post-op Assessment: Report given to PACU RN  Post vital signs: Reviewed and stable  Complications: No apparent anesthesia complications

## 2014-03-25 DIAGNOSIS — Z89512 Acquired absence of left leg below knee: Secondary | ICD-10-CM

## 2014-03-25 DIAGNOSIS — I10 Essential (primary) hypertension: Secondary | ICD-10-CM

## 2014-03-25 LAB — CBC
HCT: 28.4 % — ABNORMAL LOW (ref 39.0–52.0)
Hemoglobin: 8.7 g/dL — ABNORMAL LOW (ref 13.0–17.0)
MCH: 24.7 pg — ABNORMAL LOW (ref 26.0–34.0)
MCHC: 30.6 g/dL (ref 30.0–36.0)
MCV: 80.7 fL (ref 78.0–100.0)
Platelets: 485 10*3/uL — ABNORMAL HIGH (ref 150–400)
RBC: 3.52 MIL/uL — ABNORMAL LOW (ref 4.22–5.81)
RDW: 16.7 % — ABNORMAL HIGH (ref 11.5–15.5)
WBC: 10.1 10*3/uL (ref 4.0–10.5)

## 2014-03-25 LAB — BASIC METABOLIC PANEL
Anion gap: 10 (ref 5–15)
BUN: 11 mg/dL (ref 6–23)
CO2: 26 mmol/L (ref 19–32)
Calcium: 8.4 mg/dL (ref 8.4–10.5)
Chloride: 101 mEq/L (ref 96–112)
Creatinine, Ser: 1.29 mg/dL (ref 0.50–1.35)
GFR calc Af Amer: 62 mL/min — ABNORMAL LOW (ref >90)
GFR calc non Af Amer: 53 mL/min — ABNORMAL LOW (ref >90)
Glucose, Bld: 138 mg/dL — ABNORMAL HIGH (ref 70–99)
Potassium: 3.7 mmol/L (ref 3.5–5.1)
Sodium: 137 mmol/L (ref 135–145)

## 2014-03-25 LAB — GLUCOSE, CAPILLARY
Glucose-Capillary: 129 mg/dL — ABNORMAL HIGH (ref 70–99)
Glucose-Capillary: 207 mg/dL — ABNORMAL HIGH (ref 70–99)
Glucose-Capillary: 290 mg/dL — ABNORMAL HIGH (ref 70–99)
Glucose-Capillary: 305 mg/dL — ABNORMAL HIGH (ref 70–99)
Glucose-Capillary: 308 mg/dL — ABNORMAL HIGH (ref 70–99)
Glucose-Capillary: 362 mg/dL — ABNORMAL HIGH (ref 70–99)

## 2014-03-25 LAB — PROTIME-INR
INR: 1.3 (ref 0.00–1.49)
Prothrombin Time: 16.3 seconds — ABNORMAL HIGH (ref 11.6–15.2)

## 2014-03-25 MED ORDER — OXYCODONE HCL ER 10 MG PO T12A
10.0000 mg | EXTENDED_RELEASE_TABLET | Freq: Two times a day (BID) | ORAL | Status: DC
  Administered 2014-03-25 – 2014-03-26 (×3): 10 mg via ORAL
  Filled 2014-03-25 (×3): qty 1

## 2014-03-25 MED ORDER — OXYCODONE HCL 5 MG PO TABS
5.0000 mg | ORAL_TABLET | ORAL | Status: DC | PRN
  Administered 2014-03-25 – 2014-03-30 (×6): 5 mg via ORAL
  Filled 2014-03-25 (×6): qty 1

## 2014-03-25 MED ORDER — WARFARIN SODIUM 7.5 MG PO TABS
7.5000 mg | ORAL_TABLET | Freq: Once | ORAL | Status: AC
  Administered 2014-03-25: 7.5 mg via ORAL
  Filled 2014-03-25: qty 1

## 2014-03-25 MED ORDER — INSULIN ASPART 100 UNIT/ML ~~LOC~~ SOLN
0.0000 [IU] | SUBCUTANEOUS | Status: DC
  Administered 2014-03-25: 11 [IU] via SUBCUTANEOUS
  Administered 2014-03-25: 15 [IU] via SUBCUTANEOUS
  Administered 2014-03-25: 8 [IU] via SUBCUTANEOUS
  Administered 2014-03-25: 11 [IU] via SUBCUTANEOUS
  Administered 2014-03-26: 5 [IU] via SUBCUTANEOUS
  Administered 2014-03-26: 3 [IU] via SUBCUTANEOUS
  Administered 2014-03-26: 8 [IU] via SUBCUTANEOUS
  Administered 2014-03-26: 5 [IU] via SUBCUTANEOUS
  Administered 2014-03-26: 11 [IU] via SUBCUTANEOUS
  Administered 2014-03-27: 5 [IU] via SUBCUTANEOUS
  Administered 2014-03-27: 8 [IU] via SUBCUTANEOUS
  Administered 2014-03-27 (×3): 5 [IU] via SUBCUTANEOUS
  Administered 2014-03-27: 3 [IU] via SUBCUTANEOUS
  Administered 2014-03-28 (×2): 8 [IU] via SUBCUTANEOUS
  Administered 2014-03-28: 5 [IU] via SUBCUTANEOUS
  Administered 2014-03-28: 11 [IU] via SUBCUTANEOUS
  Administered 2014-03-28: 8 [IU] via SUBCUTANEOUS

## 2014-03-25 NOTE — Progress Notes (Signed)
PROGRESS NOTE  Jordan [name] ZOX:[id number] DOB: [birth year] DOA: 03/20/2014 PCP: Lindwood QuaHOFFMAN,BYRON, MD  HPI/Recap of past 1424 hours: 73 year old male with past history of poorly controlled diabetes mellitus with secondary peripheral vascular disease and CAD status post left arterial bypass in early November followed by partial foot amputation who has been following with vascular and orthopedic surgery and then presented to the emergency room after 1 week of overall malaise and 2 days of fevers, chills and elevated blood sugars as well as some wound dehiscence. Patient presented to the emergency room on 12/20 and noted to be afebrile with no leukocytosis, but foot x-ray noted area of lucency and possible osteomyelitis. Patient's admitted to the hospitalist service, started on broad-spectrum antibiotics.   Patient seen by orthopedist surgery who felt like the findings on x-ray were more related to chronic findings with no osteomyelitis. No abscess, cellulitis or drainage was noted. They felt the ulcerations were ischemic in patients be continued on nitroglycerin patch plus daily dressings. However this afternoon, patient's blood cultures have come back positive for 4/4 bottles of gram-positive cocci in clusters, which came back as staph aureus, not found resistant only to penicillin and tetracycline.  ID consulted & 2D echo ordered.  MRI of foot confirms abscess + osteomyelitis.  Orthopedic surgery performed a BKA on the night of 12/24  Postop day 1, patient today complaining of some left stump pain and fatigue.   Assessment/Plan: Principal Problem:   SIRS (systemic inflammatory response syndrome) in the setting of with secondary diabetic foot ulcer and history of recent midfoot amputation w/osteomyelitis: Resolved. Continue vancomycin. With BKA, underlying source for infection removed.  Chronic diastolic heart failure: Noted on echocardiogram. BNP stable. Already on ARB plus beta blocker  Active  Problems:   DM (diabetes mellitus), type 2, uncontrolled, periph vascular complic: A1c elevated, in the context of osteomyelitis. Now that he has undergone a BKA, likely his insulin requirements will go down significantly.  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 Have stopped Percocet and added OxyIR as well as scheduled every 12 hours extended release OxyContin    Hyperlipidemia: Continue statin   Essential hypertension: Blood pressures overall stable  Mild aortic stenosis: Noted on echocardiogram    Protein-calorie malnutrition, severe: Patient meets criteria for severe malnutrition of acute illness. Seen by nutrition. Recommending boost twice a day during hospitalization  Shoulder pain: Patient complained of this initially. xrays done, no evidence of fracture or abnormal lucency  Code Status: Full code   Family Communication: Wife at the bedside   Disposition Plan: Likely skilled nursing in a few days   Consultants:  Orthopedic surgery   ID  Procedures:  None   Antibiotics:  IV Rocephin 12/20-12/21  IV vancomycin 12/20-12/23  IV Cipro 12/20-12/21  IV Ancef 12/23-present   Objective: BP 128/61 mmHg  Pulse 73  Temp(Src) 98.5 F (36.9 C) (Oral)  Resp 18  Ht 5\' 9"  (1.753 m)  Wt 98.9 kg (218 lb 0.6 oz)  BMI 32.18 kg/m2  SpO2 96%  Intake/Output Summary (Last 24 hours) at 03/25/14 1538 Last data filed at 03/25/14 0900  Gross per 24 hour  Intake    851 ml  Output    700 ml  Net    151 ml   Filed Weights   03/20/14 1316 03/20/14 1702 03/22/14 0454  Weight: 91.173 kg (201 lb) 94.7 kg (208 lb 12.4 oz) 98.9 kg (218 lb 0.6 oz)    Exam:  General:  Alert and oriented 3, mild distress from pain  Cardiovascular: Regular rate and rhythm, S1-S2, 3/6 systolic ejection murmur  Respiratory: Clear to auscultation bilaterally  Abdomen: Soft, nontender, nondistended, positive bowel sounds   Musculoskeletal: Status post  left BKA, wrapped    Data Reviewed: Basic Metabolic Panel:  Recent Labs Lab 03/20/14 1320 03/21/14 0510 03/22/14 0430 03/23/14 0545 03/24/14 0640 03/24/14 1833 03/25/14 0430  NA 131* 131* 130*  --  131*  --  137  K 4.6 4.6 3.7  --  3.0*  --  3.7  CL 91* 93* 97  --  95*  --  101  CO2 26 23 25   --  25  --  26  GLUCOSE 401* 325* 309*  --  164*  --  138*  BUN 11 11 11   --  12  --  11  CREATININE 1.10 1.05 1.17 1.28 1.24 1.38* 1.29  CALCIUM 9.1 8.7 8.3*  --  8.3*  --  8.4   Liver Function Tests:  Recent Labs Lab 03/20/14 1839 03/21/14 0510  AST  --  32  ALT  --  25  ALKPHOS  --  117  BILITOT  --  0.4  PROT  --  8.3  ALBUMIN 2.4* 2.3*   No results for input(s): LIPASE, AMYLASE in the last 168 hours. No results for input(s): AMMONIA in the last 168 hours. CBC:  Recent Labs Lab 03/20/14 1320 03/21/14 0510 03/22/14 0430 03/24/14 0640 03/24/14 1833 03/25/14 0430  WBC 11.5* 12.0* 10.5 10.4 10.0 10.1  NEUTROABS 8.5*  --   --   --   --   --   HGB 9.4* 9.7* 8.8* 8.7* 9.5* 8.7*  HCT 29.9* 29.9* 28.5* 27.7* 30.2* 28.4*  MCV 80.4 81.3 79.6 79.4 81.8 80.7  PLT 299 384 403* 464* 468* 485*   Cardiac Enzymes:    Recent Labs Lab 03/20/14 1320  TROPONINI <0.30   BNP (last 3 results) No results for input(s): PROBNP in the last 8760 hours. CBG:  Recent Labs Lab 03/24/14 1630 03/24/14 1712 03/24/14 1932 03/24/14 2349 03/25/14 0405  GLUCAP 191* 220* 363* 181* 129*    Recent Results (from the past 240 hour(s))  Culture, blood (routine x 2)     Status: None   Collection Time: 03/20/14  1:30 PM  Result Value Ref Range Status   Specimen Description BLOOD RIGHT WRIST  Final   Special Requests BOTTLES DRAWN AEROBIC AND ANAEROBIC 5CCS  Final   Culture  Setup Time   Final    03/20/2014 23:45 Performed at [facility]    Culture   Final    STAPHYLOCOCCUS AUREUS Note: RIFAMPIN AND GENTAMICIN SHOULD NOT BE USED AS SINGLE DRUGS FOR TREATMENT OF STAPH  INFECTIONS. Note: Gram Stain Report Called to,Read Back By and Verified With: CAROL HUCKABEE 03/21/14 1025 BY SMITHERSJ Performed at [facility]    Report Status 03/23/2014 FINAL  Final   Organism ID, Bacteria STAPHYLOCOCCUS AUREUS  Final      Susceptibility   Staphylococcus aureus - MIC*    CLINDAMYCIN <=0.25 SENSITIVE Sensitive     ERYTHROMYCIN <=0.25 SENSITIVE Sensitive     GENTAMICIN <=0.5 SENSITIVE Sensitive     LEVOFLOXACIN <=0.12 SENSITIVE Sensitive     OXACILLIN 0.5 SENSITIVE Sensitive     PENICILLIN >=0.5 RESISTANT Resistant     RIFAMPIN <=0.5 SENSITIVE Sensitive     TRIMETH/SULFA <=10 SENSITIVE Sensitive     VANCOMYCIN <=0.5 SENSITIVE Sensitive  TETRACYCLINE >=16 RESISTANT Resistant     MOXIFLOXACIN <=0.25 SENSITIVE Sensitive     * STAPHYLOCOCCUS AUREUS  Culture, blood (routine x 2)     Status: None   Collection Time: 03/20/14  2:11 PM  Result Value Ref Range Status   Specimen Description BLOOD LEFT ANTECUBITAL  Final   Special Requests BOTTLES DRAWN AEROBIC AND ANAEROBIC 3CCS  Final   Culture  Setup Time   Final    03/20/2014 23:45 Performed at [facility]    Culture   Final    STAPHYLOCOCCUS AUREUS Note: SUSCEPTIBILITIES PERFORMED ON PREVIOUS CULTURE WITHIN THE LAST 5 DAYS. Note: Gram Stain Report Called to,Read Back By and Verified With: CAROL HUCKABEE 03/21/14 1025 BY SMITHERSJ Culture results may be compromised due to an inadequate volume of blood received in culture bottles. Performed at [facility]    Report Status 03/23/2014 FINAL  Final  Urine culture     Status: None   Collection Time: 03/20/14  3:49 PM  Result Value Ref Range Status   Specimen Description URINE, CLEAN CATCH  Final   Special Requests Normal  Final   Culture  Setup Time   Final    03/20/2014 21:30 Performed at [facility]    Colony Count NO GROWTH Performed at [facility]   Final   Culture NO GROWTH Performed at [facility]    Final   Report Status 03/21/2014 FINAL  Final  Culture, blood (routine x 2)     Status: None (Preliminary result)   Collection Time: 03/23/14  5:45 AM  Result Value Ref Range Status   Specimen Description BLOOD LEFT FOREARM  Final   Special Requests BOTTLES DRAWN AEROBIC AND ANAEROBIC 10CC  Final   Culture  Setup Time   Final    03/23/2014 11:23 Performed at [facility]    Culture   Final           BLOOD CULTURE RECEIVED NO GROWTH TO DATE CULTURE WILL BE HELD FOR 5 DAYS BEFORE ISSUING A FINAL NEGATIVE REPORT Performed at [facility]    Report Status PENDING  Incomplete  Culture, blood (routine x 2)     Status: None (Preliminary result)   Collection Time: 03/23/14  6:10 AM  Result Value Ref Range Status   Specimen Description BLOOD LEFT HAND  Final   Special Requests BOTTLES DRAWN AEROBIC ONLY 10CC  Final   Culture  Setup Time   Final    03/23/2014 11:22 Performed at [facility]    Culture   Final           BLOOD CULTURE RECEIVED NO GROWTH TO DATE CULTURE WILL BE HELD FOR 5 DAYS BEFORE ISSUING A FINAL NEGATIVE REPORT Performed at [facility]    Report Status PENDING  Incomplete  Surgical pcr screen     Status: None   Collection Time: 03/24/14  5:44 AM  Result Value Ref Range Status   MRSA, PCR NEGATIVE NEGATIVE Final   Staphylococcus aureus NEGATIVE NEGATIVE Final    Comment:        The Xpert SA Assay (FDA approved for NASAL specimens in patients over 45 years of age), is one component of a comprehensive surveillance program.  Test performance has been validated by [employer] for patients greater than or equal to 35 year old. It is not intended to diagnose infection nor to guide or monitor treatment.      Studies: No results found.  Scheduled Meds: . amLODipine  10 mg Oral Daily  . aspirin  81 mg Oral Daily  .  ceFAZolin (ANCEF) IV  2 g Intravenous 3 times per day  . docusate sodium  100 mg Oral BID  . feeding supplement  (GLUCERNA SHAKE)  237 mL Oral BID BM  . feeding supplement (PRO-STAT SUGAR FREE 64)  30 mL Oral BID BM  . heparin subcutaneous  5,000 Units Subcutaneous 3 times per day  . insulin aspart  0-15 Units Subcutaneous 6 times per day  . insulin glargine  10 Units Subcutaneous QHS  . isosorbide mononitrate  30 mg Oral Daily  . lactose free nutrition  237 mL Oral BID WC  . losartan  100 mg Oral Daily  . metoprolol succinate  100 mg Oral BID  . mirtazapine  7.5 mg Oral QHS  . OxyCODONE  10 mg Oral Q12H  . pantoprazole  40 mg Oral Daily  . rosuvastatin  40 mg Oral QPM  . silver sulfADIAZINE  1 application Topical Daily  . warfarin  7.5 mg Oral ONCE-1800  . Warfarin - Pharmacist Dosing Inpatient   Does not apply q1800    Continuous Infusions:     Time spent: 25 minutes   Hollice EspyKRISHNAN,Charde Macfarlane K  [facility] Pager [phone] . If 7PM-7AM, please contact night-coverage at [email or web address], password [email or web address] 03/25/2014, 3:38 PM  LOS: 5 days

## 2014-03-25 NOTE — Progress Notes (Signed)
ANTICOAGULATION CONSULT NOTE - Follow-up Consult  Pharmacy Consult for coumadin Indication: VTE prophylaxis  Allergies  Allergen Reactions  . Cilostazol Other (See Comments)    Feels like feet on fire    Patient Measurements: Height: 5\' 9"  (175.3 cm) Weight: 218 lb 0.6 oz (98.9 kg) IBW/kg (Calculated) : 70.7   Vital Signs: Temp: 99.4 F (37.4 C) (12/25 0536) Temp Source: Oral (12/25 0536) BP: 138/58 mmHg (12/25 0536) Pulse Rate: 76 (12/25 0536)  Labs:  Recent Labs  03/24/14 0640 03/24/14 1833 03/25/14 0430  HGB 8.7* 9.5* 8.7*  HCT 27.7* 30.2* 28.4*  PLT 464* 468* 485*  LABPROT  --   --  16.3*  INR  --   --  1.30  CREATININE 1.24 1.38* 1.29    Estimated Creatinine Clearance: 59.2 mL/min (by C-G formula based on Cr of 1.29).  Assessment: 73 yo man on Coumadin for VTE px s/p BKA. INR 1.3 (baseline 1.22 ~382mos ago). Also on SQ heparin until INR >2. Hgb down to 8.7 - watch, plt elevated. No bleeding noted.  Goal of Therapy:  INR 2-3 Monitor platelets by anticoagulation protocol: Yes   Plan:  Coumadin 7.5 mg po again today Daily PT/INR Continue SQ heparin until INR >2  Thanks for allowing pharmacy to be a part of this patient's care. Christoper Fabianaron Michael Ventresca, PharmD, BCPS Clinical pharmacist, pager [phone] 03/25/2014,10:54 AM

## 2014-03-26 LAB — GLUCOSE, CAPILLARY
Glucose-Capillary: 162 mg/dL — ABNORMAL HIGH (ref 70–99)
Glucose-Capillary: 208 mg/dL — ABNORMAL HIGH (ref 70–99)
Glucose-Capillary: 220 mg/dL — ABNORMAL HIGH (ref 70–99)
Glucose-Capillary: 227 mg/dL — ABNORMAL HIGH (ref 70–99)
Glucose-Capillary: 283 mg/dL — ABNORMAL HIGH (ref 70–99)
Glucose-Capillary: 306 mg/dL — ABNORMAL HIGH (ref 70–99)

## 2014-03-26 LAB — BASIC METABOLIC PANEL
Anion gap: 11 (ref 5–15)
BUN: 11 mg/dL (ref 6–23)
CO2: 26 mmol/L (ref 19–32)
Calcium: 8.3 mg/dL — ABNORMAL LOW (ref 8.4–10.5)
Chloride: 98 mEq/L (ref 96–112)
Creatinine, Ser: 1.42 mg/dL — ABNORMAL HIGH (ref 0.50–1.35)
GFR calc Af Amer: 55 mL/min — ABNORMAL LOW (ref >90)
GFR calc non Af Amer: 47 mL/min — ABNORMAL LOW (ref >90)
Glucose, Bld: 188 mg/dL — ABNORMAL HIGH (ref 70–99)
Potassium: 3.4 mmol/L — ABNORMAL LOW (ref 3.5–5.1)
Sodium: 135 mmol/L (ref 135–145)

## 2014-03-26 LAB — PROTIME-INR
INR: 1.87 — ABNORMAL HIGH (ref 0.00–1.49)
Prothrombin Time: 21.7 seconds — ABNORMAL HIGH (ref 11.6–15.2)

## 2014-03-26 MED ORDER — SODIUM CHLORIDE 0.9 % IJ SOLN
10.0000 mL | INTRAMUSCULAR | Status: DC | PRN
  Administered 2014-03-30 (×2): 10 mL
  Filled 2014-03-26 (×2): qty 40

## 2014-03-26 MED ORDER — SODIUM CHLORIDE 0.9 % IJ SOLN
10.0000 mL | Freq: Two times a day (BID) | INTRAMUSCULAR | Status: DC
  Administered 2014-03-26 – 2014-03-27 (×3): 10 mL
  Administered 2014-03-28: 20 mL

## 2014-03-26 MED ORDER — WARFARIN SODIUM 7.5 MG PO TABS
7.5000 mg | ORAL_TABLET | Freq: Once | ORAL | Status: AC
  Administered 2014-03-26: 7.5 mg via ORAL
  Filled 2014-03-26: qty 1

## 2014-03-26 NOTE — Progress Notes (Signed)
Physical Therapy Evaluation Patient Details Name: Jordan [name] MRN: [id number] DOB: 11-Nov-1940 Today's Date: 03/26/2014   History of Present Illness  Patient is a 73 yo male admitted 03/20/14 with SIRS.  Patient with infection and osteomyelitis in Lt foot.  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  PMH:  DM - poorly controlled, PVD, recent Lt foot partial amputation, CHF, CAD, HLD, Rt shoulder pain  Clinical Impression  Patient presents with problems listed below. Will benefit from acute PT to maximize functional mobility prior to discharge.  Recommend SNF at discharge for continued therapy.    Follow Up Recommendations SNF;Supervision/Assistance - 24 hour    Equipment Recommendations  None recommended by PT    Recommendations for Other Services       Precautions / Restrictions Precautions Precautions: Fall Restrictions Weight Bearing Restrictions: No      Mobility  Bed Mobility Overal bed mobility: Needs Assistance Bed Mobility: Supine to Sit;Sit to Supine     Supine to sit: Max assist;HOB elevated Sit to supine: Max assist   General bed mobility comments: Verbal cues for technique.  Assist to move LLE and hips toward EOB using bed pad.  Assist to bring trunk to sitting position.  Patient with limited use of RUE to assist due to painful shoulder.  Once upright, patient with fair balance in sitting.  Sat EOB x 12 minutes. Patient reports mild lightheadedness in sitting.  Returned to supine with max assist.  Transfers                    Ambulation/Gait                Stairs            Wheelchair Mobility    Modified Rankin (Stroke Patients Only)       Balance Overall balance assessment: Needs assistance Sitting-balance support: Single extremity supported;Feet supported Sitting balance-Leahy Scale: Fair                                       Pertinent Vitals/Pain Pain Assessment: 0-10 Pain Score: 4  Pain Location: Lt stump, Rt  shoulder Pain Descriptors / Indicators: Aching;Sore Pain Intervention(s): Premedicated before session;Limited activity within patient's tolerance;Repositioned    Home Living Family/patient expects to be discharged to:: Skilled nursing facility Living Arrangements: Spouse/significant other             Home Equipment: Walker - 2 wheels;Shower seat;Bedside commode;Wheelchair - manual      Prior Function Level of Independence: Independent with assistive device(s);Needs assistance   Gait / Transfers Assistance Needed: Using RW for gait.  Was receiving HHPT pta.  ADL's / Homemaking Assistance Needed: Assist with bathing/dressing (back and feet), meal prep, housekeeping.        Hand Dominance   Dominant Hand: Right    Extremity/Trunk Assessment   Upper Extremity Assessment: Generalized weakness;RUE deficits/detail RUE Deficits / Details: Decreased ROM shoulder to < 90* flexion.  Strength 3/5 for shoulder movement. RUE: Unable to fully assess due to pain RUE Sensation: history of peripheral neuropathy     Lower Extremity Assessment: RLE deficits/detail;LLE deficits/detail RLE Deficits / Details: Strength grossly 4/5 LLE Deficits / Details: s/p BKA 03/24/14.  Strength at hip and knee grossly 3-/5.  Cervical / Trunk Assessment: Normal  Communication   Communication: No difficulties  Cognition Arousal/Alertness: Awake/alert Behavior During Therapy: Flat affect Overall Cognitive Status: Within Functional  Limits for tasks assessed                      General Comments      Exercises Amputee Exercises Quad Sets: AROM;Left;5 reps;Seated      Assessment/Plan    PT Assessment Patient needs continued PT services  PT Diagnosis Difficulty walking;Generalized weakness;Acute pain   PT Problem List Decreased strength;Decreased activity tolerance;Decreased range of motion;Decreased balance;Decreased mobility;Decreased knowledge of use of DME;Decreased knowledge of  precautions;Impaired sensation;Pain  PT Treatment Interventions DME instruction;Gait training;Functional mobility training;Therapeutic activities;Therapeutic exercise;Balance training;Patient/family education   PT Goals (Current goals can be found in the Care Plan section) Acute Rehab PT Goals Patient Stated Goal: None stated PT Goal Formulation: With patient/family Time For Goal Achievement: 04/09/14 Potential to Achieve Goals: Good    Frequency Min 3X/week   Barriers to discharge Decreased caregiver support Do not feel wife can provide level of assist patient requires at this time.    Co-evaluation               End of Session   Activity Tolerance: Patient limited by fatigue;Patient limited by pain Patient left: in bed;with call bell/phone within reach;with family/visitor present Nurse Communication: Mobility status         Time: [id number] PT Time Calculation (min) (ACUTE ONLY): 24 min   Charges:   PT Evaluation $Initial PT Evaluation Tier I: 1 Procedure PT Treatments $Therapeutic Activity: 8-22 mins   PT G Codes:        Vena AustriaDavis, Georges Victorio H 03/26/2014, 6:11 PM Durenda HurtSusan H. Renaldo Fiddleravis, PT, [facility] CenterMBA Acute Rehab Services Pager [phone]

## 2014-03-26 NOTE — Progress Notes (Signed)
Peripherally Inserted Central Catheter/Midline Placement  The IV Nurse has discussed with the patient and/or persons authorized to consent for the patient, the purpose of this procedure and the potential benefits and risks involved with this procedure.  The benefits include less needle sticks, lab draws from the catheter and patient may be discharged home with the catheter.  Risks include, but not limited to, infection, bleeding, blood clot (thrombus formation), and puncture of an artery; nerve damage and irregular heat beat.  Alternatives to this procedure were also discussed.  PICC/Midline Placement Documentation  PICC / Midline Single Lumen 03/26/14 PICC Cephalic 45 cm 0 cm (Active)  Indication for Insertion or Continuance of Line Poor Vasculature-patient has had multiple peripheral attempts or PIVs lasting less than 24 hours 03/26/2014 11:49 AM  Exposed Catheter (cm) 0 cm 03/26/2014 11:49 AM  Site Assessment Clean;Dry;Intact 03/26/2014 11:49 AM  Line Status Flushed;Saline locked;Blood return noted 03/26/2014 11:49 AM  Dressing Change Due 04/02/14 03/26/2014 11:49 AM       [name], [name] 03/26/2014, 11:51 AM

## 2014-03-26 NOTE — Progress Notes (Signed)
ANTICOAGULATION CONSULT NOTE - Follow Up Consult  Pharmacy Consult for Coumadin Indication: VTE prophylaxis  Allergies  Allergen Reactions  . Cilostazol Other (See Comments)    Feels like feet on fire    Patient Measurements: Height: 5\' 9"  (175.3 cm) Weight: 218 lb 0.6 oz (98.9 kg) IBW/kg (Calculated) : 70.7 Heparin Dosing Weight:   Vital Signs: Temp: 99 F (37.2 C) (12/26 0526) Temp Source: Oral (12/26 0526) BP: 144/58 mmHg (12/26 0526) Pulse Rate: 77 (12/26 0526)  Labs:  Recent Labs  03/24/14 0640 03/24/14 1833 03/25/14 0430 03/26/14 0429  HGB 8.7* 9.5* 8.7*  --   HCT 27.7* 30.2* 28.4*  --   PLT 464* 468* 485*  --   LABPROT  --   --  16.3* 21.7*  INR  --   --  1.30 1.87*  CREATININE 1.24 1.38* 1.29 1.42*    Estimated Creatinine Clearance: 53.7 mL/min (by C-G formula based on Cr of 1.42).   Medications:  Scheduled:  . amLODipine  10 mg Oral Daily  . aspirin  81 mg Oral Daily  .  ceFAZolin (ANCEF) IV  2 g Intravenous 3 times per day  . docusate sodium  100 mg Oral BID  . feeding supplement (GLUCERNA SHAKE)  237 mL Oral BID BM  . feeding supplement (PRO-STAT SUGAR FREE 64)  30 mL Oral BID BM  . heparin subcutaneous  5,000 Units Subcutaneous 3 times per day  . insulin aspart  0-15 Units Subcutaneous 6 times per day  . insulin glargine  10 Units Subcutaneous QHS  . isosorbide mononitrate  30 mg Oral Daily  . lactose free nutrition  237 mL Oral BID WC  . losartan  100 mg Oral Daily  . metoprolol succinate  100 mg Oral BID  . mirtazapine  7.5 mg Oral QHS  . pantoprazole  40 mg Oral Daily  . rosuvastatin  40 mg Oral QPM  . sodium chloride  10-40 mL Intracatheter Q12H  . Warfarin - Pharmacist Dosing Inpatient   Does not apply q1800    Assessment: 73yom on Coumadin for VTE px s/p BKA.  INR 1.87 this AM, moving toward goal.  No CBC today, no bleeding noted.    Goal of Therapy:  INR 2-3 Monitor platelets by anticoagulation protocol: Yes   Plan:  Coumadin  7.5mg  today F/U in AM  Marisue HumbleKendra Jerad Dunlap, PharmD Clinical Pharmacist [facility]

## 2014-03-26 NOTE — Progress Notes (Signed)
Subjective: 2 Days Post-Op Procedure(s) (LRB): AMPUTATION BELOW KNEE (Left) Patient reports pain as moderate.    Objective: Vital signs in last 24 hours: Temp:  [98.5 F (36.9 C)-99.9 F (37.7 C)] 99 F (37.2 C) (12/26 0526) Pulse Rate:  [73-77] 77 (12/26 0526) Resp:  [18-20] 20 (12/26 0526) BP: (128-144)/(55-62) 144/58 mmHg (12/26 0526) SpO2:  [94 %-96 %] 94 % (12/26 0526)  Intake/Output from previous day: 12/25 0701 - 12/26 0700 In: 884 [P.O.:834; IV Piggyback:50] Out: 1325 [Urine:1325] Intake/Output this shift:     Recent Labs  03/24/14 0640 03/24/14 1833 03/25/14 0430  HGB 8.7* 9.5* 8.7*    Recent Labs  03/24/14 1833 03/25/14 0430  WBC 10.0 10.1  RBC 3.69* 3.52*  HCT 30.2* 28.4*  PLT 468* 485*    Recent Labs  03/25/14 0430 03/26/14 0429  NA 137 135  K 3.7 3.4*  CL 101 98  CO2 26 26  BUN 11 11  CREATININE 1.29 1.42*  GLUCOSE 138* 188*  CALCIUM 8.4 8.3*    Recent Labs  03/25/14 0430 03/26/14 0429  INR 1.30 1.87*    Neurologically intact dressing is dry  Assessment/Plan: 2 Days Post-Op Procedure(s) (LRB): AMPUTATION BELOW KNEE (Left) Up with therapy      Need to have leg elevated so his pain will be less and pain med needs decrease so he is more alert etc.   Jordan [name] C 03/26/2014, 8:20 AM

## 2014-03-26 NOTE — Progress Notes (Signed)
PROGRESS NOTE  Jordan [name] ZOX:[id number] DOB: [birth year] DOA: 03/20/2014 PCP: [name], MD  HPI/Recap of past 5 hours: 73 year old male with past history of poorly controlled diabetes mellitus with secondary peripheral vascular disease and CAD status post left arterial bypass in early November followed by partial foot amputation who has been following with vascular and orthopedic surgery and then presented to the emergency room after 1 week of overall malaise and 2 days of fevers, chills and elevated blood sugars as well as some wound dehiscence. Patient presented to the emergency room on 12/20 and noted to be afebrile with no leukocytosis, but foot x-ray noted area of lucency and possible osteomyelitis. Patient's admitted to the hospitalist service, started on broad-spectrum antibiotics.   Patient seen by orthopedist surgery who felt like the findings on x-ray were more related to chronic findings with no osteomyelitis. No abscess, cellulitis or drainage was noted. They felt the ulcerations were ischemic in patients be continued on nitroglycerin patch plus daily dressings. However this afternoon, patient's blood cultures have come back positive for 4/4 bottles of gram-positive cocci in clusters, which came back as staph aureus, not found resistant only to penicillin and tetracycline.  ID consulted & 2D echo ordered.  MRI of foot confirms abscess + osteomyelitis.  Orthopedic surgery performed a BKA on the night of 12/24.    Postop day 2, patient somewhat drowsy, but appears to have pain better controlled. He lost IV access.   Assessment/Plan: Principal Problem:   SIRS (systemic inflammatory response syndrome) in the setting of with secondary diabetic foot ulcer and history of recent midfoot amputation w/osteomyelitis: Resolved. Vancomycin narrowed to Ancef. With BKA, underlying source for infection removed.  Clarified with infectious disease who recommended TEE to determine if patient will  need 4 versus 6 weeks of IV antibiotics. Have spoken with cardiology who will set this up for Monday, 12/28.  Chronic diastolic heart failure: Noted on echocardiogram. BNP stable. Already on ARB plus beta blocker  Active Problems:   DM (diabetes mellitus), type 2, uncontrolled, periph vascular complic: A1c elevated, in the context of osteomyelitis. Now that he has undergone a BKA, likely his insulin requirements will go down significantly.  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 This is making him somewhat drowsy so discontinuing this and continuing on OxyIR when necessary only    Hyperlipidemia: Continue statin   Essential hypertension: Blood pressures overall stable  Mild aortic stenosis: Noted on echocardiogram    Protein-calorie malnutrition, severe: Patient meets criteria for severe malnutrition of acute illness. Seen by nutrition. Recommending boost twice a day during hospitalization  Shoulder pain: Patient complained of this initially. xrays done, no evidence of fracture or abnormal lucency  Code Status: Full code   Family Communication: Wife at the bedside   Disposition Plan: Likely skilled nursing once antibiotic course plan determined after TEE   Consultants:  Orthopedic surgery   ID  Cardiology  Procedures:  TEE planned for Monday 12/28  Status post left BKA done 12/25  2-D echo done 12/23: Grade 1 diastolic dysfunction  PICC line placed 12/26  Antibiotics:  IV Rocephin 12/20-12/21  IV vancomycin 12/20-12/23  IV Cipro 12/20-12/21  IV Ancef 12/23-present   Objective: BP 144/58 mmHg  Pulse 77  Temp(Src) 99 F (37.2 C) (Oral)  Resp 20  Ht 5\' 9"  (1.753 m)  Wt 98.9 kg (  218 lb 0.6 oz)  BMI 32.18 kg/m2  SpO2 94%  Intake/Output Summary (Last 24 hours) at 03/26/14 1227 Last data filed at 03/26/14  0535  Gross per 24 hour  Intake    644 ml  Output    775 ml  Net   -131 ml   Filed Weights   03/20/14 1316 03/20/14 1702 03/22/14 0454  Weight: 91.173 kg (201 lb) 94.7 kg (208 lb 12.4 oz) 98.9 kg (218 lb 0.6 oz)    Exam:   General:  Drowsy, no acute distress  Cardiovascular: Regular rate and rhythm, S1-S2, 3/6 systolic ejection murmur  Respiratory: Clear to auscultation bilaterally  Abdomen: Soft, nontender, nondistended, positive bowel sounds   Musculoskeletal: Status post left BKA, wrapped    Data Reviewed: Basic Metabolic Panel:  Recent Labs Lab 03/21/14 0510 03/22/14 0430 03/23/14 0545 03/24/14 0640 03/24/14 1833 03/25/14 0430 03/26/14 0429  NA 131* 130*  --  131*  --  137 135  K 4.6 3.7  --  3.0*  --  3.7 3.4*  CL 93* 97  --  95*  --  101 98  CO2 23 25  --  25  --  26 26  GLUCOSE 325* 309*  --  164*  --  138* 188*  BUN 11 11  --  12  --  11 11  CREATININE 1.05 1.17 1.28 1.24 1.38* 1.29 1.42*  CALCIUM 8.7 8.3*  --  8.3*  --  8.4 8.3*   Liver Function Tests:  Recent Labs Lab 03/20/14 1839 03/21/14 0510  AST  --  32  ALT  --  25  ALKPHOS  --  117  BILITOT  --  0.4  PROT  --  8.3  ALBUMIN 2.4* 2.3*   No results for input(s): LIPASE, AMYLASE in the last 168 hours. No results for input(s): AMMONIA in the last 168 hours. CBC:  Recent Labs Lab 03/20/14 1320 03/21/14 0510 03/22/14 0430 03/24/14 0640 03/24/14 1833 03/25/14 0430  WBC 11.5* 12.0* 10.5 10.4 10.0 10.1  NEUTROABS 8.5*  --   --   --   --   --   HGB 9.4* 9.7* 8.8* 8.7* 9.5* 8.7*  HCT 29.9* 29.9* 28.5* 27.7* 30.2* 28.4*  MCV 80.4 81.3 79.6 79.4 81.8 80.7  PLT 299 384 403* 464* 468* 485*   Cardiac Enzymes:    Recent Labs Lab 03/20/14 1320  TROPONINI <0.30   BNP (last 3 results) No results for input(s): PROBNP in the last 8760 hours. CBG:  Recent Labs Lab 03/25/14 2000 03/25/14 2337 03/26/14 0405 03/26/14 0814 03/26/14 1204  GLUCAP 362* 305* 162* 208* 283*    Recent  Results (from the past 240 hour(s))  Culture, blood (routine x 2)     Status: None   Collection Time: 03/20/14  1:30 PM  Result Value Ref Range Status   Specimen Description BLOOD RIGHT WRIST  Final   Special Requests BOTTLES DRAWN AEROBIC AND ANAEROBIC 5CCS  Final   Culture  Setup Time   Final    03/20/2014 23:45 Performed at [facility]    Culture   Final    STAPHYLOCOCCUS AUREUS Note: RIFAMPIN AND GENTAMICIN SHOULD NOT BE USED AS SINGLE DRUGS FOR TREATMENT OF STAPH INFECTIONS. Note: Gram Stain Report Called to,Read Back By and Verified With: CAROL HUCKABEE 03/21/14 1025 BY [email or web address] Performed at [facility]    Report Status 03/23/2014 FINAL  Final   Organism ID, Bacteria STAPHYLOCOCCUS AUREUS  Final  Susceptibility   Staphylococcus aureus - MIC*    CLINDAMYCIN <=0.25 SENSITIVE Sensitive     ERYTHROMYCIN <=0.25 SENSITIVE Sensitive     GENTAMICIN <=0.5 SENSITIVE Sensitive     LEVOFLOXACIN <=0.12 SENSITIVE Sensitive     OXACILLIN 0.5 SENSITIVE Sensitive     PENICILLIN >=0.5 RESISTANT Resistant     RIFAMPIN <=0.5 SENSITIVE Sensitive     TRIMETH/SULFA <=10 SENSITIVE Sensitive     VANCOMYCIN <=0.5 SENSITIVE Sensitive     TETRACYCLINE >=16 RESISTANT Resistant     MOXIFLOXACIN <=0.25 SENSITIVE Sensitive     * STAPHYLOCOCCUS AUREUS  Culture, blood (routine x 2)     Status: None   Collection Time: 03/20/14  2:11 PM  Result Value Ref Range Status   Specimen Description BLOOD LEFT ANTECUBITAL  Final   Special Requests BOTTLES DRAWN AEROBIC AND ANAEROBIC 3CCS  Final   Culture  Setup Time   Final    03/20/2014 23:45 Performed at [facility]    Culture   Final    STAPHYLOCOCCUS AUREUS Note: SUSCEPTIBILITIES PERFORMED ON PREVIOUS CULTURE WITHIN THE LAST 5 DAYS. Note: Gram Stain Report Called to,Read Back By and Verified With: CAROL HUCKABEE 03/21/14 1025 BY SMITHERSJ Culture results may be compromised due to an inadequate volume of blood received in  culture bottles. Performed at [facility]    Report Status 03/23/2014 FINAL  Final  Urine culture     Status: None   Collection Time: 03/20/14  3:49 PM  Result Value Ref Range Status   Specimen Description URINE, CLEAN CATCH  Final   Special Requests Normal  Final   Culture  Setup Time   Final    03/20/2014 21:30 Performed at [facility]    Colony Count NO GROWTH Performed at [facility]   Final   Culture NO GROWTH Performed at [facility]   Final   Report Status 03/21/2014 FINAL  Final  Culture, blood (routine x 2)     Status: None (Preliminary result)   Collection Time: 03/23/14  5:45 AM  Result Value Ref Range Status   Specimen Description BLOOD LEFT FOREARM  Final   Special Requests BOTTLES DRAWN AEROBIC AND ANAEROBIC 10CC  Final   Culture  Setup Time   Final    03/23/2014 11:23 Performed at [facility]    Culture   Final           BLOOD CULTURE RECEIVED NO GROWTH TO DATE CULTURE WILL BE HELD FOR 5 DAYS BEFORE ISSUING A FINAL NEGATIVE REPORT Performed at [facility]    Report Status PENDING  Incomplete  Culture, blood (routine x 2)     Status: None (Preliminary result)   Collection Time: 03/23/14  6:10 AM  Result Value Ref Range Status   Specimen Description BLOOD LEFT HAND  Final   Special Requests BOTTLES DRAWN AEROBIC ONLY 10CC  Final   Culture  Setup Time   Final    03/23/2014 11:22 Performed at [facility]    Culture   Final           BLOOD CULTURE RECEIVED NO GROWTH TO DATE CULTURE WILL BE HELD FOR 5 DAYS BEFORE ISSUING A FINAL NEGATIVE REPORT Performed at [facility]    Report Status PENDING  Incomplete  Surgical pcr screen     Status: None   Collection Time: 03/24/14  5:44 AM  Result Value Ref Range Status   MRSA, PCR NEGATIVE NEGATIVE Final  Staphylococcus aureus NEGATIVE NEGATIVE Final    Comment:        The Xpert SA Assay (FDA approved for NASAL specimens in patients over 61  years of age), is one component of a comprehensive surveillance program.  Test performance has been validated by [employer] for patients greater than or equal to 50 year old. It is not intended to diagnose infection nor to guide or monitor treatment.      Studies: No results found.  Scheduled Meds: . amLODipine  10 mg Oral Daily  . aspirin  81 mg Oral Daily  .  ceFAZolin (ANCEF) IV  2 g Intravenous 3 times per day  . docusate sodium  100 mg Oral BID  . feeding supplement (GLUCERNA SHAKE)  237 mL Oral BID BM  . feeding supplement (PRO-STAT SUGAR FREE 64)  30 mL Oral BID BM  . heparin subcutaneous  5,000 Units Subcutaneous 3 times per day  . insulin aspart  0-15 Units Subcutaneous 6 times per day  . insulin glargine  10 Units Subcutaneous QHS  . isosorbide mononitrate  30 mg Oral Daily  . lactose free nutrition  237 mL Oral BID WC  . losartan  100 mg Oral Daily  . metoprolol succinate  100 mg Oral BID  . mirtazapine  7.5 mg Oral QHS  . pantoprazole  40 mg Oral Daily  . rosuvastatin  40 mg Oral QPM  . sodium chloride  10-40 mL Intracatheter Q12H  . Warfarin - Pharmacist Dosing Inpatient   Does not apply q1800    Continuous Infusions:     Time spent: 15 minutes   Hollice Espy  [facility] Pager [phone] . If 7PM-7AM, please contact night-coverage at [email or web address], password [facility] 03/26/2014, 12:27 PM  LOS: 6 days

## 2014-03-27 DIAGNOSIS — M869 Osteomyelitis, unspecified: Secondary | ICD-10-CM

## 2014-03-27 LAB — GLUCOSE, CAPILLARY
Glucose-Capillary: 202 mg/dL — ABNORMAL HIGH (ref 70–99)
Glucose-Capillary: 152 mg/dL — ABNORMAL HIGH (ref 70–99)
Glucose-Capillary: 284 mg/dL — ABNORMAL HIGH (ref 70–99)
Glucose-Capillary: 249 mg/dL — ABNORMAL HIGH (ref 70–99)
Glucose-Capillary: 248 mg/dL — ABNORMAL HIGH (ref 70–99)

## 2014-03-27 LAB — CBC
HCT: 28.7 % — ABNORMAL LOW (ref 39.0–52.0)
Hemoglobin: 8.9 g/dL — ABNORMAL LOW (ref 13.0–17.0)
MCH: 25.4 pg — ABNORMAL LOW (ref 26.0–34.0)
MCHC: 31 g/dL (ref 30.0–36.0)
MCV: 82 fL (ref 78.0–100.0)
Platelets: 504 10*3/uL — ABNORMAL HIGH (ref 150–400)
RBC: 3.5 MIL/uL — ABNORMAL LOW (ref 4.22–5.81)
RDW: 16.5 % — ABNORMAL HIGH (ref 11.5–15.5)
WBC: 10.1 10*3/uL (ref 4.0–10.5)

## 2014-03-27 LAB — BASIC METABOLIC PANEL
Anion gap: 8 (ref 5–15)
BUN: 11 mg/dL (ref 6–23)
CO2: 28 mmol/L (ref 19–32)
Calcium: 8.7 mg/dL (ref 8.4–10.5)
Chloride: 100 mEq/L (ref 96–112)
Creatinine, Ser: 1.24 mg/dL (ref 0.50–1.35)
GFR calc Af Amer: 65 mL/min — ABNORMAL LOW (ref >90)
GFR calc non Af Amer: 56 mL/min — ABNORMAL LOW (ref >90)
Glucose, Bld: 201 mg/dL — ABNORMAL HIGH (ref 70–99)
Potassium: 3.2 mmol/L — ABNORMAL LOW (ref 3.5–5.1)
Sodium: 136 mmol/L (ref 135–145)

## 2014-03-27 LAB — PROTIME-INR
INR: 2.51 — ABNORMAL HIGH (ref 0.00–1.49)
Prothrombin Time: 27.3 seconds — ABNORMAL HIGH (ref 11.6–15.2)

## 2014-03-27 MED ORDER — INSULIN GLARGINE 100 UNIT/ML ~~LOC~~ SOLN
12.0000 [IU] | Freq: Every day | SUBCUTANEOUS | Status: DC
  Administered 2014-03-27: 12 [IU] via SUBCUTANEOUS
  Filled 2014-03-27 (×2): qty 0.12

## 2014-03-27 MED ORDER — WARFARIN SODIUM 1 MG PO TABS
1.0000 mg | ORAL_TABLET | Freq: Once | ORAL | Status: AC
  Administered 2014-03-27: 1 mg via ORAL
  Filled 2014-03-27: qty 1

## 2014-03-27 NOTE — Progress Notes (Signed)
ANTICOAGULATION CONSULT NOTE - Follow Up Consult  Pharmacy Consult for Coumadin Indication: VTE prophylaxis  Allergies  Allergen Reactions  . Cilostazol Other (See Comments)    Feels like feet on fire    Patient Measurements: Height: 5\' 9"  (175.3 cm) Weight: 218 lb 0.6 oz (98.9 kg) IBW/kg (Calculated) : 70.7 Heparin Dosing Weight:   Vital Signs: Temp: 98.9 F (37.2 C) (12/27 0600) BP: 149/61 mmHg (12/27 0600) Pulse Rate: 73 (12/27 0600)  Labs:  Recent Labs  03/24/14 1833 03/25/14 0430 03/26/14 0429 03/27/14 0520  HGB 9.5* 8.7*  --  8.9*  HCT 30.2* 28.4*  --  28.7*  PLT 468* 485*  --  504*  LABPROT  --  16.3* 21.7* 27.3*  INR  --  1.30 1.87* 2.51*  CREATININE 1.38* 1.29 1.42* 1.24    Estimated Creatinine Clearance: 61.5 mL/min (by C-G formula based on Cr of 1.24).   Medications:  Scheduled:  . amLODipine  10 mg Oral Daily  . aspirin  81 mg Oral Daily  .  ceFAZolin (ANCEF) IV  2 g Intravenous 3 times per day  . docusate sodium  100 mg Oral BID  . feeding supplement (GLUCERNA SHAKE)  237 mL Oral BID BM  . feeding supplement (PRO-STAT SUGAR FREE 64)  30 mL Oral BID BM  . insulin aspart  0-15 Units Subcutaneous 6 times per day  . insulin glargine  10 Units Subcutaneous QHS  . isosorbide mononitrate  30 mg Oral Daily  . lactose free nutrition  237 mL Oral BID WC  . losartan  100 mg Oral Daily  . metoprolol succinate  100 mg Oral BID  . mirtazapine  7.5 mg Oral QHS  . pantoprazole  40 mg Oral Daily  . rosuvastatin  40 mg Oral QPM  . sodium chloride  10-40 mL Intracatheter Q12H  . warfarin  1 mg Oral ONCE-1800  . Warfarin - Pharmacist Dosing Inpatient   Does not apply q1800    Assessment: 73yo male s/p L-foot partial amputation, now on Coumadin.  INR 2.51, within goal 2-3.  Heparin SQ until INR > 2.  Hg 8.9, stable.  No bleeding noted.  Goal of Therapy:  INR 2-3 Monitor platelets by anticoagulation protocol: Yes   Plan:  Coumadin 1mg  today D/C Heparin  SQ F/U daily INR and monitor for s/s of bleeding  Marisue HumbleKendra Brianda Beitler, PharmD Clinical Pharmacist [facility]

## 2014-03-27 NOTE — Progress Notes (Signed)
    [facility] has been requested to perform a transesophageal echocardiogram on 03/28/14 for bacteriemia to r/o endocarditis.  After careful review of history and examination, the risks and benefits of transesophageal echocardiogram have been explained including risks of esophageal damage, perforation (1:10,000 risk), bleeding, pharyngeal hematoma as well as other potential complications associated with conscious sedation including aspiration, arrhythmia, respiratory failure and death. Alternatives to treatment were discussed, questions were answered. Patient is willing to proceed. Wife in room as well.  ZOXWRU,EAVWUNGOLD,Benjamin Merrihew R, NP 03/27/2014 5:44 PM

## 2014-03-27 NOTE — Progress Notes (Signed)
PROGRESS NOTE  Jordan [name] FAO:[id number] DOB: [birth year] DOA: 03/20/2014 PCP: [name], MD  HPI/Recap of past 67 hours: 73 year old male with past history of poorly controlled diabetes mellitus with secondary peripheral vascular disease and CAD status post left arterial bypass in early November followed by partial foot amputation who has been following with vascular and orthopedic surgery and then presented to the emergency room after 1 week of overall malaise and 2 days of fevers, chills and elevated blood sugars as well as some wound dehiscence. Patient presented to the emergency room on 12/20 and noted to be afebrile with no leukocytosis, but foot x-ray noted area of lucency and possible osteomyelitis. Patient's admitted to the hospitalist service, started on broad-spectrum antibiotics.   Patient seen by orthopedist surgery who felt like the findings on x-ray were more related to chronic findings with no osteomyelitis. No abscess, cellulitis or drainage was noted. They felt the ulcerations were ischemic in patients be continued on nitroglycerin patch plus daily dressings. However this afternoon, patient's blood cultures have come back positive for 4/4 bottles of gram-positive cocci in clusters, which came back as staph aureus, not found resistant only to penicillin and tetracycline.  ID consulted & 2D echo ordered.  MRI of foot confirms abscess + osteomyelitis.  Orthopedic surgery performed a BKA on the night of 12/24.    Patient was having significant amount of pain postop and initially was put on extended release OxyContin for pain control, but became drowsy so this was discontinued. Today, patient is much more awake with better pain control. No complaints   Assessment/Plan: Principal Problem:   SIRS (systemic inflammatory response syndrome) in the setting of with secondary diabetic foot ulcer and history of recent midfoot amputation w/osteomyelitis: Resolved. Vancomycin narrowed to  Ancef. With BKA, underlying source for infection removed.  Clarified with infectious disease who recommended TEE to determine if patient will need 4 versus 6 weeks of IV antibiotics. Have spoken with cardiology who will set this up for Monday, 12/28.  Chronic diastolic heart failure: Noted on echocardiogram. BNP stable. Already on ARB plus beta blocker  Active Problems:   DM (diabetes mellitus), type 2, uncontrolled, periph vascular complic: A1c elevated, in the context of osteomyelitis. Now that he has undergone a BKA, likely his insulin requirements will go down significantly.  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 This is making him somewhat drowsy so discontinuing this and continuing on OxyIR when necessary only    Hyperlipidemia: Continue statin   Essential hypertension: Blood pressures overall stable  Mild aortic stenosis: Noted on echocardiogram    Protein-calorie malnutrition, severe: Patient meets criteria for severe malnutrition of acute illness. Seen by nutrition. Recommending boost twice a day during hospitalization  Shoulder pain: Patient complained of this initially. xrays done, no evidence of fracture or abnormal lucency  Code Status: Full code   Family Communication: Wife at the bedside   Disposition Plan: Likely skilled nursing once antibiotic course plan determined after TEE   Consultants:  Orthopedic surgery   ID  Cardiology  Procedures:  TEE planned for Monday 12/28  Status post left BKA done 12/25  2-D echo done 12/23: Grade 1 diastolic dysfunction  PICC line placed 12/26  Antibiotics:  IV Rocephin 12/20-12/21  IV vancomycin 12/20-12/23  IV  Cipro 12/20-12/21  IV Ancef 12/23-present   Objective: BP 137/63 mmHg  Pulse  74  Temp(Src) 98.4 F (36.9 C) (Oral)  Resp 18  Ht 5\' 9"  (1.753 m)  Wt 98.9 kg (218 lb 0.6 oz)  BMI 32.18 kg/m2  SpO2 98%  Intake/Output Summary (Last 24 hours) at 03/27/14 1453 Last data filed at 03/27/14 1431  Gross per 24 hour  Intake   1925 ml  Output   4225 ml  Net  -2300 ml   Filed Weights   03/20/14 1316 03/20/14 1702 03/22/14 0454  Weight: 91.173 kg (201 lb) 94.7 kg (208 lb 12.4 oz) 98.9 kg (218 lb 0.6 oz)    Exam:   General:  Alert and oriented 3, no acute distress  Cardiovascular: Regular rate and rhythm, S1-S2, 3/6 systolic ejection murmur  Respiratory: Clear to auscultation bilaterally  Abdomen: Soft, nontender, nondistended, positive bowel sounds   Musculoskeletal: Status post left BKA, wrapped    Data Reviewed: Basic Metabolic Panel:  Recent Labs Lab 03/22/14 0430  03/24/14 0640 03/24/14 1833 03/25/14 0430 03/26/14 0429 03/27/14 0520  NA 130*  --  131*  --  137 135 136  K 3.7  --  3.0*  --  3.7 3.4* 3.2*  CL 97  --  95*  --  101 98 100  CO2 25  --  25  --  26 26 28   GLUCOSE 309*  --  164*  --  138* 188* 201*  BUN 11  --  12  --  11 11 11   CREATININE 1.17  < > 1.24 1.38* 1.29 1.42* 1.24  CALCIUM 8.3*  --  8.3*  --  8.4 8.3* 8.7  < > = values in this interval not displayed. Liver Function Tests:  Recent Labs Lab 03/20/14 1839 03/21/14 0510  AST  --  32  ALT  --  25  ALKPHOS  --  117  BILITOT  --  0.4  PROT  --  8.3  ALBUMIN 2.4* 2.3*   No results for input(s): LIPASE, AMYLASE in the last 168 hours. No results for input(s): AMMONIA in the last 168 hours. CBC:  Recent Labs Lab 03/22/14 0430 03/24/14 0640 03/24/14 1833 03/25/14 0430 03/27/14 0520  WBC 10.5 10.4 10.0 10.1 10.1  HGB 8.8* 8.7* 9.5* 8.7* 8.9*  HCT 28.5* 27.7* 30.2* 28.4* 28.7*  MCV 79.6 79.4 81.8 80.7 82.0  PLT 403* 464* 468* 485* 504*   Cardiac Enzymes:   No results for input(s): CKTOTAL, CKMB, CKMBINDEX, TROPONINI in the last 168 hours. BNP (last 3  results) No results for input(s): PROBNP in the last 8760 hours. CBG:  Recent Labs Lab 03/26/14 2005 03/27/14 0001 03/27/14 0407 03/27/14 0749 03/27/14 1146  GLUCAP 227* 220* 202* 152* 284*    Recent Results (from the past 240 hour(s))  Culture, blood (routine x 2)     Status: None   Collection Time: 03/20/14  1:30 PM  Result Value Ref Range Status   Specimen Description BLOOD RIGHT WRIST  Final   Special Requests BOTTLES DRAWN AEROBIC AND ANAEROBIC 5CCS  Final   Culture  Setup Time   Final    03/20/2014 23:45 Performed at [facility]    Culture   Final    STAPHYLOCOCCUS AUREUS Note: RIFAMPIN AND GENTAMICIN SHOULD NOT BE USED AS SINGLE DRUGS FOR TREATMENT OF STAPH INFECTIONS. Note: Gram Stain Report Called to,Read Back By and Verified With: CAROL HUCKABEE 03/21/14 1025 BY SMITHERSJ Performed at [facility]  Report Status 03/23/2014 FINAL  Final   Organism ID, Bacteria STAPHYLOCOCCUS AUREUS  Final      Susceptibility   Staphylococcus aureus - MIC*    CLINDAMYCIN <=0.25 SENSITIVE Sensitive     ERYTHROMYCIN <=0.25 SENSITIVE Sensitive     GENTAMICIN <=0.5 SENSITIVE Sensitive     LEVOFLOXACIN <=0.12 SENSITIVE Sensitive     OXACILLIN 0.5 SENSITIVE Sensitive     PENICILLIN >=0.5 RESISTANT Resistant     RIFAMPIN <=0.5 SENSITIVE Sensitive     TRIMETH/SULFA <=10 SENSITIVE Sensitive     VANCOMYCIN <=0.5 SENSITIVE Sensitive     TETRACYCLINE >=16 RESISTANT Resistant     MOXIFLOXACIN <=0.25 SENSITIVE Sensitive     * STAPHYLOCOCCUS AUREUS  Culture, blood (routine x 2)     Status: None   Collection Time: 03/20/14  2:11 PM  Result Value Ref Range Status   Specimen Description BLOOD LEFT ANTECUBITAL  Final   Special Requests BOTTLES DRAWN AEROBIC AND ANAEROBIC 3CCS  Final   Culture  Setup Time   Final    03/20/2014 23:45 Performed at [facility]    Culture   Final    STAPHYLOCOCCUS AUREUS Note: SUSCEPTIBILITIES PERFORMED ON PREVIOUS CULTURE WITHIN  THE LAST 5 DAYS. Note: Gram Stain Report Called to,Read Back By and Verified With: CAROL HUCKABEE 03/21/14 1025 BY SMITHERSJ Culture results may be compromised due to an inadequate volume of blood received in culture bottles. Performed at [facility]    Report Status 03/23/2014 FINAL  Final  Urine culture     Status: None   Collection Time: 03/20/14  3:49 PM  Result Value Ref Range Status   Specimen Description URINE, CLEAN CATCH  Final   Special Requests Normal  Final   Culture  Setup Time   Final    03/20/2014 21:30 Performed at [facility]    Colony Count NO GROWTH Performed at [facility]   Final   Culture NO GROWTH Performed at [facility]   Final   Report Status 03/21/2014 FINAL  Final  Culture, blood (routine x 2)     Status: None (Preliminary result)   Collection Time: 03/23/14  5:45 AM  Result Value Ref Range Status   Specimen Description BLOOD LEFT FOREARM  Final   Special Requests BOTTLES DRAWN AEROBIC AND ANAEROBIC 10CC  Final   Culture  Setup Time   Final    03/23/2014 11:23 Performed at [facility]    Culture   Final           BLOOD CULTURE RECEIVED NO GROWTH TO DATE CULTURE WILL BE HELD FOR 5 DAYS BEFORE ISSUING A FINAL NEGATIVE REPORT Performed at [facility]    Report Status PENDING  Incomplete  Culture, blood (routine x 2)     Status: None (Preliminary result)   Collection Time: 03/23/14  6:10 AM  Result Value Ref Range Status   Specimen Description BLOOD LEFT HAND  Final   Special Requests BOTTLES DRAWN AEROBIC ONLY 10CC  Final   Culture  Setup Time   Final    03/23/2014 11:22 Performed at [facility]    Culture   Final           BLOOD CULTURE RECEIVED NO GROWTH TO DATE CULTURE WILL BE HELD FOR 5 DAYS BEFORE ISSUING A FINAL NEGATIVE REPORT Performed at [facility]    Report Status PENDING  Incomplete  Surgical pcr screen     Status: None   Collection  Time: 03/24/14  5:44 AM    Result Value Ref Range Status   MRSA, PCR NEGATIVE NEGATIVE Final   Staphylococcus aureus NEGATIVE NEGATIVE Final    Comment:        The Xpert SA Assay (FDA approved for NASAL specimens in patients over 73 years of age), is one component of a comprehensive surveillance program.  Test performance has been validated by [facility] for patients greater than or equal to 73 year old. It is not intended to diagnose infection nor to guide or monitor treatment.      Studies: No results found.  Scheduled Meds: . amLODipine  10 mg Oral Daily  . aspirin  81 mg Oral Daily  .  ceFAZolin (ANCEF) IV  2 g Intravenous 3 times per day  . docusate sodium  100 mg Oral BID  . feeding supplement (GLUCERNA SHAKE)  237 mL Oral BID BM  . feeding supplement (PRO-STAT SUGAR FREE 64)  30 mL Oral BID BM  . insulin aspart  0-15 Units Subcutaneous 6 times per day  . insulin glargine  10 Units Subcutaneous QHS  . isosorbide mononitrate  30 mg Oral Daily  . lactose free nutrition  237 mL Oral BID WC  . losartan  100 mg Oral Daily  . metoprolol succinate  100 mg Oral BID  . mirtazapine  7.5 mg Oral QHS  . pantoprazole  40 mg Oral Daily  . rosuvastatin  40 mg Oral QPM  . sodium chloride  10-40 mL Intracatheter Q12H  . warfarin  1 mg Oral ONCE-1800  . Warfarin - Pharmacist Dosing Inpatient   Does not apply q1800    Continuous Infusions:     Time spent: 15 minutes   Hollice EspyKRISHNAN,[place] Catarino K  [facility] Pager [phone] . If 7PM-7AM, please contact night-coverage at [email or web address], password [email or web address] 03/27/2014, 2:53 PM  LOS: 7 days

## 2014-03-28 ENCOUNTER — Encounter (HOSPITAL_COMMUNITY): Payer: Self-pay | Admitting: *Deleted

## 2014-03-28 ENCOUNTER — Encounter (HOSPITAL_COMMUNITY)
Admission: EM | Disposition: A | Discharge status: Skilled Nursing Facility | Payer: Self-pay | Source: Home / Self Care | Attending: Internal Medicine

## 2014-03-28 DIAGNOSIS — R7881 Bacteremia: Secondary | ICD-10-CM | POA: Insufficient documentation

## 2014-03-28 DIAGNOSIS — A4901 Methicillin susceptible Staphylococcus aureus infection, unspecified site: Secondary | ICD-10-CM

## 2014-03-28 DIAGNOSIS — I350 Nonrheumatic aortic (valve) stenosis: Secondary | ICD-10-CM

## 2014-03-28 DIAGNOSIS — A4101 Sepsis due to Methicillin susceptible Staphylococcus aureus: Secondary | ICD-10-CM | POA: Insufficient documentation

## 2014-03-28 HISTORY — PX: TEE WITHOUT CARDIOVERSION: SHX5443

## 2014-03-28 LAB — GLUCOSE, CAPILLARY
Glucose-Capillary: 235 mg/dL — ABNORMAL HIGH (ref 70–99)
Glucose-Capillary: 243 mg/dL — ABNORMAL HIGH (ref 70–99)
Glucose-Capillary: 262 mg/dL — ABNORMAL HIGH (ref 70–99)
Glucose-Capillary: 266 mg/dL — ABNORMAL HIGH (ref 70–99)
Glucose-Capillary: 267 mg/dL — ABNORMAL HIGH (ref 70–99)
Glucose-Capillary: 323 mg/dL — ABNORMAL HIGH (ref 70–99)

## 2014-03-28 LAB — PROTIME-INR
INR: 2.37 — ABNORMAL HIGH (ref 0.00–1.49)
Prothrombin Time: 26.1 seconds — ABNORMAL HIGH (ref 11.6–15.2)

## 2014-03-28 SURGERY — ECHOCARDIOGRAM, TRANSESOPHAGEAL
Anesthesia: Moderate Sedation

## 2014-03-28 MED ORDER — LIDOCAINE VISCOUS 2 % MT SOLN
OROMUCOSAL | Status: AC
  Filled 2014-03-28: qty 15

## 2014-03-28 MED ORDER — MIDAZOLAM HCL 5 MG/ML IJ SOLN
INTRAMUSCULAR | Status: AC
  Filled 2014-03-28: qty 2

## 2014-03-28 MED ORDER — INSULIN ASPART 100 UNIT/ML ~~LOC~~ SOLN
0.0000 [IU] | Freq: Every day | SUBCUTANEOUS | Status: DC
  Administered 2014-03-28: 2 [IU] via SUBCUTANEOUS
  Administered 2014-03-29: 5 [IU] via SUBCUTANEOUS

## 2014-03-28 MED ORDER — INSULIN GLARGINE 100 UNIT/ML ~~LOC~~ SOLN
15.0000 [IU] | Freq: Every day | SUBCUTANEOUS | Status: DC
  Administered 2014-03-28: 15 [IU] via SUBCUTANEOUS
  Filled 2014-03-28 (×2): qty 0.15

## 2014-03-28 MED ORDER — BUTAMBEN-TETRACAINE-BENZOCAINE 2-2-14 % EX AERO
INHALATION_SPRAY | CUTANEOUS | Status: DC | PRN
  Administered 2014-03-28: 1 via TOPICAL

## 2014-03-28 MED ORDER — FENTANYL CITRATE 0.05 MG/ML IJ SOLN
INTRAMUSCULAR | Status: AC
  Filled 2014-03-28: qty 2

## 2014-03-28 MED ORDER — MIDAZOLAM HCL 10 MG/2ML IJ SOLN
INTRAMUSCULAR | Status: DC | PRN
  Administered 2014-03-28: 2 mg via INTRAVENOUS

## 2014-03-28 MED ORDER — INSULIN ASPART 100 UNIT/ML ~~LOC~~ SOLN
0.0000 [IU] | Freq: Three times a day (TID) | SUBCUTANEOUS | Status: DC
  Administered 2014-03-29: 5 [IU] via SUBCUTANEOUS
  Administered 2014-03-29: 11 [IU] via SUBCUTANEOUS
  Administered 2014-03-29 – 2014-03-30 (×2): 15 [IU] via SUBCUTANEOUS
  Administered 2014-03-30: 11 [IU] via SUBCUTANEOUS

## 2014-03-28 MED ORDER — DIPHENHYDRAMINE HCL 50 MG/ML IJ SOLN
INTRAMUSCULAR | Status: AC
  Filled 2014-03-28: qty 1

## 2014-03-28 MED ORDER — WARFARIN SODIUM 3 MG PO TABS
3.0000 mg | ORAL_TABLET | Freq: Once | ORAL | Status: AC
  Administered 2014-03-28: 3 mg via ORAL
  Filled 2014-03-28: qty 1

## 2014-03-28 MED ORDER — LIDOCAINE VISCOUS 2 % MT SOLN
OROMUCOSAL | Status: DC | PRN
  Administered 2014-03-28: 5 mL via OROMUCOSAL

## 2014-03-28 MED ORDER — SODIUM CHLORIDE 0.9 % IV SOLN
INTRAVENOUS | Status: DC
Start: 2014-03-28 — End: 2014-03-30

## 2014-03-28 NOTE — Interval H&P Note (Signed)
History and Physical Interval Note:  03/28/2014 11:42 AM  Jordan [name]  has presented today for surgery, with the diagnosis of r/o endocarditis  The various methods of treatment have been discussed with the patient and family. After consideration of risks, benefits and other options for treatment, the patient has consented to  Procedure(s): TRANSESOPHAGEAL ECHOCARDIOGRAM (TEE) (N/A) as a surgical intervention .  The patient's history has been reviewed, patient examined, no change in status, stable for surgery.  I have reviewed the patient's chart and labs.  Questions were answered to the patient's satisfaction.     TURNER,TRACI R

## 2014-03-28 NOTE — Progress Notes (Signed)
Echocardiogram 2D Echocardiogram has been performed.  Dorothey BasemanReel, Demontrae Gilbert M 03/28/2014, 12:13 PM

## 2014-03-28 NOTE — CV Procedure (Signed)
PROCEDURE NOTE  Procedure:Transesophageal echocardiogram Operator:  Armanda Magicraci Sinead Hockman, MD Indications: bacteremia Complications:None IV Meds: Versed 2mg  IV  Results: Low normal LV size and function with EF 50% Normal RV size and function Normal RA Normal LA and LAA Normal TV Normal PV Mild to moderately thickened and calcified trileaflet AV with mildly reduced leaflet excursion consistent with mild aortic stenosis.  No obvious vegetation noted.  There was no AI. Mildly calcified anterior MV leaflet tip with trivial MR Normal interatrial septum with no evidence of intracardiac shunt by colorflow doppler Normal thoracic and ascending aorta No obvious vegetation noted although AV is thickened but most consistent with mild AS.  Clinical correlation recommended.    The patient tolerated the procedure well and was transferred back to his room in stable condition.  Signed: Armanda Magicraci Makinley Muscato, MD [facility] 03/28/2014

## 2014-03-28 NOTE — Progress Notes (Signed)
[facility]    Subjective: No new complaints   Antibiotics:  Anti-infectives    Start     Dose/Rate Route Frequency Ordered Stop   03/24/14 1800  vancomycin (VANCOCIN) IVPB 1000 mg/200 mL premix     1,000 mg200 mL/hr over 60 Minutes Intravenous Every 12 hours 03/24/14 1723 03/24/14 1914   03/23/14 1800  vancomycin (VANCOCIN) IVPB 750 mg/150 ml premix  Status:  Discontinued     750 mg150 mL/hr over 60 Minutes Intravenous Every 12 hours 03/23/14 0846 03/23/14 0906   03/23/14 1000  ceFAZolin (ANCEF) IVPB 2 g/50 mL premix     2 g100 mL/hr over 30 Minutes Intravenous 3 times per day 03/23/14 0916     03/20/14 2200  cefTRIAXone (ROCEPHIN) 2 g in dextrose 5 % 50 mL IVPB - Premix  Status:  Discontinued     2 g100 mL/hr over 30 Minutes Intravenous Every 24 hours 03/20/14 1714 03/21/14 1754   03/20/14 2200  metroNIDAZOLE (FLAGYL) tablet 500 mg  Status:  Discontinued     500 mg Oral 3 times per day 03/20/14 1714 03/21/14 1754   03/20/14 1700  vancomycin (VANCOCIN) 1,250 mg in sodium chloride 0.9 % 250 mL IVPB  Status:  Discontinued     1,250 mg166.7 mL/hr over 90 Minutes Intravenous Every 12 hours 03/20/14 1554 03/22/14 1748   03/20/14 1600  metroNIDAZOLE (FLAGYL) IVPB 500 mg     500 mg100 mL/hr over 60 Minutes Intravenous  Once 03/20/14 1548 03/20/14 1707   03/20/14 1600  ceFEPIme (MAXIPIME) 2 g in dextrose 5 % 50 mL IVPB     2 g100 mL/hr over 30 Minutes Intravenous  Once 03/20/14 1548 03/20/14 1647      Medications: Scheduled Meds: . amLODipine  10 mg Oral Daily  . aspirin  81 mg Oral Daily  .  ceFAZolin (ANCEF) IV  2 g Intravenous 3 times per day  . docusate sodium  100 mg Oral BID  . feeding supplement (PRO-STAT SUGAR FREE 64)  30 mL Oral BID BM  . insulin aspart  0-15 Units Subcutaneous 6 times per day  . insulin glargine  12 Units Subcutaneous QHS  . isosorbide mononitrate  30 mg Oral Daily  . lactose free nutrition  237 mL Oral BID WC  . losartan   100 mg Oral Daily  . metoprolol succinate  100 mg Oral BID  . mirtazapine  7.5 mg Oral QHS  . pantoprazole  40 mg Oral Daily  . rosuvastatin  40 mg Oral QPM  . sodium chloride  10-40 mL Intracatheter Q12H  . warfarin  3 mg Oral ONCE-1800  . Warfarin - Pharmacist Dosing Inpatient   Does not apply q1800   Continuous Infusions: . sodium chloride     PRN Meds:.albuterol, alum & mag hydroxide-simeth, bisacodyl, butamben-tetracaine-benzocaine, lidocaine, metoCLOPramide **OR** metoCLOPramide (REGLAN) injection, midazolam, nitroGLYCERIN, ondansetron **OR** ondansetron (ZOFRAN) IV, ondansetron **OR** ondansetron (ZOFRAN) IV, oxyCODONE, polyethylene glycol, sodium chloride    Objective: Weight change:   Intake/Output Summary (Last 24 hours) at 03/28/14 1206 Last data filed at 03/28/14 0700  Gross per 24 hour  Intake    850 ml  Output   2225 ml  Net  -1375 ml   Blood pressure 129/57, pulse 77, temperature 98.7 F (37.1 C), temperature source Oral, resp. rate 19, height 5\' 9"  (1.753 m), weight 218 lb 0.6 oz (98.9 kg), SpO2 96 %. Temp:  [98.4 F (36.9 C)-99.3 F (37.4 C)] 98.7 F (37.1  C) (12/28 1130) Pulse Rate:  [73-81] 77 (12/28 1155) Resp:  [16-21] 19 (12/28 1155) BP: (125-150)/(57-63) 129/57 mmHg (12/28 1155) SpO2:  [95 %-99 %] 96 % (12/28 1155)  Physical Exam: General: Alert and awake, oriented x3, not in any acute distress. HEENT: anicteric sclera, , EOMI CVS regular rate, normal r,   Chest:  no wheezing Abdomen: soft, nondistended Extremities: BKA site wrapped Neuro: nonfocal  CBC:  Recent Labs Lab 03/22/14 0430 03/24/14 0640 03/24/14 1833 03/25/14 0430 03/26/14 0429 03/27/14 0520 03/28/14 0710  HGB 8.8* 8.7* 9.5* 8.7*  --  8.9*  --   HCT 28.5* 27.7* 30.2* 28.4*  --  28.7*  --   PLT 403* 464* 468* 485*  --  504*  --   INR  --   --   --  1.30 1.87* 2.51* 2.37*     BMET  Recent Labs  03/26/14 0429 03/27/14 0520  NA 135 136  K 3.4* 3.2*  CL 98 100  CO2  26 28  GLUCOSE 188* 201*  BUN 11 11  CREATININE 1.42* 1.24  CALCIUM 8.3* 8.7     Liver Panel  No results for input(s): PROT, ALBUMIN, AST, ALT, ALKPHOS, BILITOT, BILIDIR, IBILI in the last 72 hours.     Sedimentation Rate No results for input(s): ESRSEDRATE in the last 72 hours. C-Reactive Protein No results for input(s): CRP in the last 72 hours.  Micro Results: Recent Results (from the past 720 hour(s))  Culture, blood (routine x 2)     Status: None   Collection Time: 03/20/14  1:30 PM  Result Value Ref Range Status   Specimen Description BLOOD RIGHT WRIST  Final   Special Requests BOTTLES DRAWN AEROBIC AND ANAEROBIC 5CCS  Final   Culture  Setup Time   Final    03/20/2014 23:45 Performed at [facility]    Culture   Final    STAPHYLOCOCCUS AUREUS Note: RIFAMPIN AND GENTAMICIN SHOULD NOT BE USED AS SINGLE DRUGS FOR TREATMENT OF STAPH INFECTIONS. Note: Gram Stain Report Called to,Read Back By and Verified With: CAROL HUCKABEE 03/21/14 1025 BY SMITHERSJ Performed at [facility]    Report Status 03/23/2014 FINAL  Final   Organism ID, Bacteria STAPHYLOCOCCUS AUREUS  Final      Susceptibility   Staphylococcus aureus - MIC*    CLINDAMYCIN <=0.25 SENSITIVE Sensitive     ERYTHROMYCIN <=0.25 SENSITIVE Sensitive     GENTAMICIN <=0.5 SENSITIVE Sensitive     LEVOFLOXACIN <=0.12 SENSITIVE Sensitive     OXACILLIN 0.5 SENSITIVE Sensitive     PENICILLIN >=0.5 RESISTANT Resistant     RIFAMPIN <=0.5 SENSITIVE Sensitive     TRIMETH/SULFA <=10 SENSITIVE Sensitive     VANCOMYCIN <=0.5 SENSITIVE Sensitive     TETRACYCLINE >=16 RESISTANT Resistant     MOXIFLOXACIN <=0.25 SENSITIVE Sensitive     * STAPHYLOCOCCUS AUREUS  Culture, blood (routine x 2)     Status: None   Collection Time: 03/20/14  2:11 PM  Result Value Ref Range Status   Specimen Description BLOOD LEFT ANTECUBITAL  Final   Special Requests BOTTLES DRAWN AEROBIC AND ANAEROBIC 3CCS  Final   Culture   Setup Time   Final    03/20/2014 23:45 Performed at [facility]    Culture   Final    STAPHYLOCOCCUS AUREUS Note: SUSCEPTIBILITIES PERFORMED ON PREVIOUS CULTURE WITHIN THE LAST 5 DAYS. Note: Gram Stain Report Called to,Read Back By and Verified With: CAROL HUCKABEE 03/21/14 1025 BY SMITHERSJ Culture  results may be compromised due to an inadequate volume of blood received in culture bottles. Performed at [facility]    Report Status 03/23/2014 FINAL  Final  Urine culture     Status: None   Collection Time: 03/20/14  3:49 PM  Result Value Ref Range Status   Specimen Description URINE, CLEAN CATCH  Final   Special Requests Normal  Final   Culture  Setup Time   Final    03/20/2014 21:30 Performed at [facility]    Colony Count NO GROWTH Performed at [facility]   Final   Culture NO GROWTH Performed at [facility]   Final   Report Status 03/21/2014 FINAL  Final  Culture, blood (routine x 2)     Status: None (Preliminary result)   Collection Time: 03/23/14  5:45 AM  Result Value Ref Range Status   Specimen Description BLOOD LEFT FOREARM  Final   Special Requests BOTTLES DRAWN AEROBIC AND ANAEROBIC 10CC  Final   Culture  Setup Time   Final    03/23/2014 11:23 Performed at [facility]    Culture   Final           BLOOD CULTURE RECEIVED NO GROWTH TO DATE CULTURE WILL BE HELD FOR 5 DAYS BEFORE ISSUING A FINAL NEGATIVE REPORT Performed at [facility]    Report Status PENDING  Incomplete  Culture, blood (routine x 2)     Status: None (Preliminary result)   Collection Time: 03/23/14  6:10 AM  Result Value Ref Range Status   Specimen Description BLOOD LEFT HAND  Final   Special Requests BOTTLES DRAWN AEROBIC ONLY 10CC  Final   Culture  Setup Time   Final    03/23/2014 11:22 Performed at [facility]    Culture   Final           BLOOD CULTURE RECEIVED NO GROWTH TO DATE CULTURE WILL BE HELD FOR 5 DAYS BEFORE  ISSUING A FINAL NEGATIVE REPORT Performed at [facility]    Report Status PENDING  Incomplete  Surgical pcr screen     Status: None   Collection Time: 03/24/14  5:44 AM  Result Value Ref Range Status   MRSA, PCR NEGATIVE NEGATIVE Final   Staphylococcus aureus NEGATIVE NEGATIVE Final    Comment:        The Xpert SA Assay (FDA approved for NASAL specimens in patients over 73 years of age), is one component of a comprehensive surveillance program.  Test performance has been validated by [facility] for patients greater than or equal to 73 year old. It is not intended to diagnose infection nor to guide or monitor treatment.     Studies/Results: No results found.    Assessment/Plan:  Principal Problem:   SIRS (systemic inflammatory response syndrome) Active Problems:   DM (diabetes mellitus), type 2, uncontrolled, periph vascular complic   Hyperlipidemia   Essential hypertension   Coronary atherosclerosis   PAD (peripheral artery disease)   Diabetic foot ulcer   Infected wound   Protein-calorie malnutrition, severe   Chronic diastolic heart failure   Osteomyelitis of left foot   Foot infection   Bacteremia    Jordan [name] is a 73 y.o. male with MSSA bacteremia and osteomyelitis of foot sp BKA. He had TEE this am to look for endocarditis  #1 MSSA bacteremia:  --IF TEE is POSITIVE FOR ENDOCARDITIS then he will need SIX WEEKS OF IV ANCEF FROM DATE OF  NEGATIVE BLOOD CULTURES AND EVALUATION BY CT SURGERY  --IF TEE NEGATIVE HE CAN BE TREATED WITH  BETWEEN 2- 4 WEEKS OF IV ANTIBIOTICS AND AFTER DISCUSSION HE WOULD LIKE TO GO WITH THE LATTER  #2 Osteomyelitis: should be cured with BKA  I will followup results of the TEE. If TEE negative I will sign off and arrange for HSFU in our clinic in 2-3weeks  Please call with further questions.   LOS: 8 days   Jordan [name] 03/28/2014, 12:06 PM

## 2014-03-28 NOTE — Progress Notes (Signed)
ANTICOAGULATION CONSULT NOTE - Follow Up Consult  Pharmacy Consult for Coumadin Indication: VTE prophylaxis  Allergies  Allergen Reactions  . Cilostazol Other (See Comments)    Feels like feet on fire    Patient Measurements: Height: 5\' 9"  (175.3 cm) Weight: 218 lb 0.6 oz (98.9 kg) IBW/kg (Calculated) : 70.7 Heparin Dosing Weight:   Vital Signs: Temp: 98.8 F (37.1 C) (12/28 0537) Temp Source: Oral (12/28 0537) BP: 150/57 mmHg (12/28 0537) Pulse Rate: 73 (12/28 0537)  Labs:  Recent Labs  03/26/14 0429 03/27/14 0520 03/28/14 0710  HGB  --  8.9*  --   HCT  --  28.7*  --   PLT  --  504*  --   LABPROT 21.7* 27.3* 26.1*  INR 1.87* 2.51* 2.37*  CREATININE 1.42* 1.24  --     Estimated Creatinine Clearance: 61.5 mL/min (by C-G formula based on Cr of 1.24).   Medications:  Scheduled:  . amLODipine  10 mg Oral Daily  . aspirin  81 mg Oral Daily  .  ceFAZolin (ANCEF) IV  2 g Intravenous 3 times per day  . docusate sodium  100 mg Oral BID  . feeding supplement (GLUCERNA SHAKE)  237 mL Oral BID BM  . feeding supplement (PRO-STAT SUGAR FREE 64)  30 mL Oral BID BM  . insulin aspart  0-15 Units Subcutaneous 6 times per day  . insulin glargine  12 Units Subcutaneous QHS  . isosorbide mononitrate  30 mg Oral Daily  . lactose free nutrition  237 mL Oral BID WC  . losartan  100 mg Oral Daily  . metoprolol succinate  100 mg Oral BID  . mirtazapine  7.5 mg Oral QHS  . pantoprazole  40 mg Oral Daily  . rosuvastatin  40 mg Oral QPM  . sodium chloride  10-40 mL Intracatheter Q12H  . Warfarin - Pharmacist Dosing Inpatient   Does not apply q1800    Assessment: 73yo male s/p L-foot partial amputation, now on Coumadin.  INR 2.37, within goal 2-3.   Hg 8.9, stable.  No bleeding noted.  Goal of Therapy:  INR 2-3 Monitor platelets by anticoagulation protocol: Yes   Plan:  Coumadin 3 mg today F/U daily INR and monitor for s/s of bleeding  Thanks for allowing pharmacy to be a  part of this patient's care.  Talbert CageLora Zulema [place], PharmD Clinical Pharmacist, [phone]

## 2014-03-28 NOTE — Progress Notes (Signed)
PROGRESS NOTE  Jordan [name] ZOX:[id number] DOB: 1940-10-01 DOA: 03/20/2014 PCP: [name], MD  HPI/Recap of past 28 hours: 73 year old male with past history of poorly controlled diabetes mellitus with secondary peripheral vascular disease and CAD status post left arterial bypass in early November followed by partial foot amputation who has been following with vascular and orthopedic surgery and then presented to the emergency room after 1 week of overall malaise and 2 days of fevers, chills and elevated blood sugars as well as some wound dehiscence. Patient presented to the emergency room on 12/20 and noted to be afebrile with no leukocytosis, but foot x-ray noted area of lucency and possible osteomyelitis. Patient's admitted to the hospitalist service, started on broad-spectrum antibiotics.   Patient seen by orthopedist surgery who felt like the findings on x-ray were more related to chronic findings with no osteomyelitis. No abscess, cellulitis or drainage was noted. They felt the ulcerations were ischemic in patients be continued on nitroglycerin patch plus daily dressings. However this afternoon, patient's blood cultures have come back positive for 4/4 bottles of gram-positive cocci in clusters, which came back as staph aureus, not found resistant only to penicillin and tetracycline.  ID consulted & 2D echo ordered.  MRI of foot confirms abscess + osteomyelitis.  Orthopedic surgery performed a BKA on the night of 12/24.    Patient underwent TEE on 12/28 to rule out valvular vegetations and this was negative. Patient currently doing well with little if any pain   Assessment/Plan: Principal Problem:   SIRS (systemic inflammatory response syndrome) in the setting of with secondary diabetic foot ulcer and history of recent midfoot amputation w/osteomyelitis: Resolved. Vancomycin narrowed to Ancef. With BKA, underlying source for infection removed. With TEE being normal, patient will need 4  weeks of antibiotics total, stop on 1/16  Chronic diastolic heart failure: Noted on echocardiogram. BNP stable. Already on ARB plus beta blocker  Active Problems:   DM (diabetes mellitus), type 2, uncontrolled, periph vascular complic: A1c elevated, in the context of osteomyelitis. Now that he has undergone a BKA, likely his insulin requirements will go down significantly.   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 This is making him somewhat drowsy so discontinuing this and continuing on OxyIR when necessary only    Hyperlipidemia: Continue statin   Essential hypertension: Blood pressures overall stable  Mild aortic stenosis: Noted on echocardiogram    Protein-calorie malnutrition, severe: Patient meets criteria for severe malnutrition of acute illness. Seen by nutrition. Recommending boost twice a day during hospitalization  Shoulder pain: Patient complained of this initially. xrays done, no evidence of fracture or abnormal lucency  Code Status: Full code   Family Communication: Wife at the bedside   Disposition Plan: Skilled nursing   Consultants:  Orthopedic surgery   ID  Cardiology  Procedures:  TEE 12/28: No evidence of valvular vegetation, mild aortic stenosis noted  Status post left BKA done 12/25  2-D echo done 12/23: Grade 1 diastolic dysfunction, mild aortic stenosis noted  PICC line placed 12/26  Antibiotics:  IV Rocephin 12/20-12/21  IV vancomycin 12/20-12/23  IV Cipro 12/20-12/21  IV Ancef 12/23-1/16   Objective: BP 136/61 mmHg  Pulse 76  Temp(Src) 98.6 F (37 C) (Oral)  Resp 18  Ht 5\' 9"  (1.753 m)  Wt 98.9 kg (218  lb 0.6 oz)  BMI 32.18 kg/m2  SpO2 96%  Intake/Output Summary (Last 24 hours) at 03/28/14 2149 Last data filed at 03/28/14 0700  Gross per  24 hour  Intake    240 ml  Output   1800 ml  Net  -1560 ml   Filed Weights   03/20/14 1316 03/20/14 1702 03/22/14 0454  Weight: 91.173 kg (201 lb) 94.7 kg (208 lb 12.4 oz) 98.9 kg (218 lb 0.6 oz)    Exam:   General:  Alert and oriented 3, no acute distress  Cardiovascular: Regular rate and rhythm, S1-S2, 3/6 systolic ejection murmur  Respiratory: Clear to auscultation bilaterally  Abdomen: Soft, nontender, nondistended, positive bowel sounds   Musculoskeletal: Status post left BKA, wrapped, casted    Data Reviewed: Basic Metabolic Panel:  Recent Labs Lab 03/22/14 0430  03/24/14 0640 03/24/14 1833 03/25/14 0430 03/26/14 0429 03/27/14 0520  NA 130*  --  131*  --  137 135 136  K 3.7  --  3.0*  --  3.7 3.4* 3.2*  CL 97  --  95*  --  101 98 100  CO2 25  --  25  --  26 26 28   GLUCOSE 309*  --  164*  --  138* 188* 201*  BUN 11  --  12  --  11 11 11   CREATININE 1.17  < > 1.24 1.38* 1.29 1.42* 1.24  CALCIUM 8.3*  --  8.3*  --  8.4 8.3* 8.7  < > = values in this interval not displayed. Liver Function Tests: No results for input(s): AST, ALT, ALKPHOS, BILITOT, PROT, ALBUMIN in the last 168 hours. No results for input(s): LIPASE, AMYLASE in the last 168 hours. No results for input(s): AMMONIA in the last 168 hours. CBC:  Recent Labs Lab 03/22/14 0430 03/24/14 0640 03/24/14 1833 03/25/14 0430 03/27/14 0520  WBC 10.5 10.4 10.0 10.1 10.1  HGB 8.8* 8.7* 9.5* 8.7* 8.9*  HCT 28.5* 27.7* 30.2* 28.4* 28.7*  MCV 79.6 79.4 81.8 80.7 82.0  PLT 403* 464* 468* 485* 504*   Cardiac Enzymes:   No results for input(s): CKTOTAL, CKMB, CKMBINDEX, TROPONINI in the last 168 hours. BNP (last 3 results) No results for input(s): PROBNP in the last 8760 hours. CBG:  Recent Labs Lab 03/28/14 0410 03/28/14 0750 03/28/14 1254 03/28/14 1712 03/28/14 2128  GLUCAP 267* 235* 266* 262* 243*    Recent Results (from the past 240 hour(s))  Culture, blood (routine x 2)     Status:  None   Collection Time: 03/20/14  1:30 PM  Result Value Ref Range Status   Specimen Description BLOOD RIGHT WRIST  Final   Special Requests BOTTLES DRAWN AEROBIC AND ANAEROBIC 5CCS  Final   Culture  Setup Time   Final    03/20/2014 23:45 Performed at [facility]    Culture   Final    STAPHYLOCOCCUS AUREUS Note: RIFAMPIN AND GENTAMICIN SHOULD NOT BE USED AS SINGLE DRUGS FOR TREATMENT OF STAPH INFECTIONS. Note: Gram Stain Report Called to,Read Back By and Verified With: CAROL HUCKABEE 03/21/14 1025 BY SMITHERSJ Performed at [facility]    Report Status 03/23/2014 FINAL  Final   Organism ID, Bacteria STAPHYLOCOCCUS AUREUS  Final      Susceptibility   Staphylococcus aureus - MIC*    CLINDAMYCIN <=0.25 SENSITIVE Sensitive     ERYTHROMYCIN <=0.25 SENSITIVE Sensitive     GENTAMICIN <=0.5 SENSITIVE Sensitive     LEVOFLOXACIN <=0.12 SENSITIVE Sensitive  OXACILLIN 0.5 SENSITIVE Sensitive     PENICILLIN >=0.5 RESISTANT Resistant     RIFAMPIN <=0.5 SENSITIVE Sensitive     TRIMETH/SULFA <=10 SENSITIVE Sensitive     VANCOMYCIN <=0.5 SENSITIVE Sensitive     TETRACYCLINE >=16 RESISTANT Resistant     MOXIFLOXACIN <=0.25 SENSITIVE Sensitive     * STAPHYLOCOCCUS AUREUS  Culture, blood (routine x 2)     Status: None   Collection Time: 03/20/14  2:11 PM  Result Value Ref Range Status   Specimen Description BLOOD LEFT ANTECUBITAL  Final   Special Requests BOTTLES DRAWN AEROBIC AND ANAEROBIC 3CCS  Final   Culture  Setup Time   Final    03/20/2014 23:45 Performed at [facility]    Culture   Final    STAPHYLOCOCCUS AUREUS Note: SUSCEPTIBILITIES PERFORMED ON PREVIOUS CULTURE WITHIN THE LAST 5 DAYS. Note: Gram Stain Report Called to,Read Back By and Verified With: CAROL HUCKABEE 03/21/14 1025 BY SMITHERSJ Culture results may be compromised due to an inadequate volume of blood received in culture bottles. Performed at [facility]    Report Status 03/23/2014  FINAL  Final  Urine culture     Status: None   Collection Time: 03/20/14  3:49 PM  Result Value Ref Range Status   Specimen Description URINE, CLEAN CATCH  Final   Special Requests Normal  Final   Culture  Setup Time   Final    03/20/2014 21:30 Performed at [facility]    Colony Count NO GROWTH Performed at [facility]   Final   Culture NO GROWTH Performed at [facility]   Final   Report Status 03/21/2014 FINAL  Final  Culture, blood (routine x 2)     Status: None (Preliminary result)   Collection Time: 03/23/14  5:45 AM  Result Value Ref Range Status   Specimen Description BLOOD LEFT FOREARM  Final   Special Requests BOTTLES DRAWN AEROBIC AND ANAEROBIC 10CC  Final   Culture  Setup Time   Final    03/23/2014 11:23 Performed at [facility]    Culture   Final           BLOOD CULTURE RECEIVED NO GROWTH TO DATE CULTURE WILL BE HELD FOR 5 DAYS BEFORE ISSUING A FINAL NEGATIVE REPORT Performed at [facility]    Report Status PENDING  Incomplete  Culture, blood (routine x 2)     Status: None (Preliminary result)   Collection Time: 03/23/14  6:10 AM  Result Value Ref Range Status   Specimen Description BLOOD LEFT HAND  Final   Special Requests BOTTLES DRAWN AEROBIC ONLY 10CC  Final   Culture  Setup Time   Final    03/23/2014 11:22 Performed at [facility]    Culture   Final           BLOOD CULTURE RECEIVED NO GROWTH TO DATE CULTURE WILL BE HELD FOR 5 DAYS BEFORE ISSUING A FINAL NEGATIVE REPORT Performed at [facility]    Report Status PENDING  Incomplete  Surgical pcr screen     Status: None   Collection Time: 03/24/14  5:44 AM  Result Value Ref Range Status   MRSA, PCR NEGATIVE NEGATIVE Final   Staphylococcus aureus NEGATIVE NEGATIVE Final    Comment:        The Xpert SA Assay (FDA approved for NASAL specimens in patients over 10221 years of age), is one component of a comprehensive surveillance program.  Test  performance has been validated by [facility] for patients greater than or equal to 73 year old. It is not intended to diagnose infection nor to guide or monitor treatment.      Studies: No results found.  Scheduled Meds: . amLODipine  10 mg Oral Daily  . aspirin  81 mg Oral Daily  .  ceFAZolin (ANCEF) IV  2 g Intravenous 3 times per day  . docusate sodium  100 mg Oral BID  . feeding supplement (PRO-STAT SUGAR FREE 64)  30 mL Oral BID BM  . [START ON 03/29/2014] insulin aspart  0-15 Units Subcutaneous TID WC  . insulin aspart  0-5 Units Subcutaneous QHS  . insulin glargine  15 Units Subcutaneous QHS  . isosorbide mononitrate  30 mg Oral Daily  . lactose free nutrition  237 mL Oral BID WC  . losartan  100 mg Oral Daily  . metoprolol succinate  100 mg Oral BID  . mirtazapine  7.5 mg Oral QHS  . pantoprazole  40 mg Oral Daily  . rosuvastatin  40 mg Oral QPM  . sodium chloride  10-40 mL Intracatheter Q12H  . Warfarin - Pharmacist Dosing Inpatient   Does not apply q1800    Continuous Infusions: . sodium chloride       Time spent: 15 minutes   Hollice EspyKRISHNAN,Heberto Sturdevant K  [facility] Pager [phone] . If 7PM-7AM, please contact night-coverage at [email or web address], password [email or web address] 03/28/2014, 9:49 PM  LOS: 8 days

## 2014-03-28 NOTE — Progress Notes (Signed)
NUTRITION FOLLOW UP  Pt meets criteria for severe MALNUTRITION in the context of acute illness as evidenced by >5% weight loss in 1 month with intake insufficient to meet >75% estimated needs for >1 month.  Intervention:   -D/c Glucerna Shake po BID, each supplement provides 220 kcal and 10 grams of protein -Continue Boost Plus BID -Continue 30 ml Prostat BID  Nutrition Dx:   Increased nutrient needs related to healing as evidenced by patient with non-healing ulcer to foot; ongoing  Goal:   Pt will meet >90% of estimated nutritional needs  Monitor:   PO/supplement intake, labs, weight changes, I/O's  Assessment:   Patient admitted with infected wound. Patient with recent hospitalization for non-healing foot ulcer ultimately requiring partial food amputation. Patient now presents with drainage from foot wound with dehiscence.   PROCEDURE: Procedure(s) ON 03/24/14: AMPUTATION BELOW KNEE (LEFT)  Pt is currently NPO due to pending TEE.  Appetite is fair on a heart healthy/ carb modified diet; average meal completion 50%. Multiple supplements have been ordered for pt- Glucerna BID (noted multiple refusals), 30 ml Prostat BID, and Boost Plus BID.  Discharge disposition is likely SNF.  Labs reviewed. K: 3.2, Glucose: 201, CBGS: 235-323.   Height: Ht Readings from Last 1 Encounters:  03/20/14 5\' 9"  (1.753 m)    Weight Status:   Wt Readings from Last 1 Encounters:  03/22/14 218 lb 0.6 oz (98.9 kg)   03/20/14 208 lb 12.4 oz (94.7 kg)   Re-estimated needs:  Kcal: 2100-2300 Protein: 95-105 grams Fluid: 2.1-2.3 L  Skin: closed incision of lt leg, Lt BKA  Diet Order: Diet NPO time specified   Intake/Output Summary (Last 24 hours) at 03/28/14 1057 Last data filed at 03/28/14 0700  Gross per 24 hour  Intake    850 ml  Output   2225 ml  Net  -1375 ml    Last BM: 03/20/14   Labs:   Recent Labs Lab 03/25/14 0430 03/26/14 0429 03/27/14 0520  NA 137 135 136  K  3.7 3.4* 3.2*  CL 101 98 100  CO2 26 26 28   BUN 11 11 11   CREATININE 1.29 1.42* 1.24  CALCIUM 8.4 8.3* 8.7  GLUCOSE 138* 188* 201*    CBG (last 3)   Recent Labs  03/28/14 0004 03/28/14 0410 03/28/14 0750  GLUCAP 323* 267* 235*    Scheduled Meds: . amLODipine  10 mg Oral Daily  . aspirin  81 mg Oral Daily  .  ceFAZolin (ANCEF) IV  2 g Intravenous 3 times per day  . docusate sodium  100 mg Oral BID  . feeding supplement (GLUCERNA SHAKE)  237 mL Oral BID BM  . feeding supplement (PRO-STAT SUGAR FREE 64)  30 mL Oral BID BM  . insulin aspart  0-15 Units Subcutaneous 6 times per day  . insulin glargine  12 Units Subcutaneous QHS  . isosorbide mononitrate  30 mg Oral Daily  . lactose free nutrition  237 mL Oral BID WC  . losartan  100 mg Oral Daily  . metoprolol succinate  100 mg Oral BID  . mirtazapine  7.5 mg Oral QHS  . pantoprazole  40 mg Oral Daily  . rosuvastatin  40 mg Oral QPM  . sodium chloride  10-40 mL Intracatheter Q12H  . warfarin  3 mg Oral ONCE-1800  . Warfarin - Pharmacist Dosing Inpatient   Does not apply q1800    Continuous Infusions:   Sanders Manninen A. Mayford KnifeWilliams, RD, LDN Pager: [phone] After  hours Pager: [phone]

## 2014-03-28 NOTE — Progress Notes (Signed)
PT Cancellation Note  Patient Details Name: Jordan [name] MRN: [id number] DOB: Oct 21, 1940   Cancelled Treatment:    Reason Eval/Treat Not Completed: Patient at procedure or test/unavailable.  Patient out of room for TEE.  Will return tomorrow for PT session.   Vena AustriaDavis, Sher Shampine H 03/28/2014, 11:39 AM Durenda HurtSusan H. Renaldo Fiddleravis, PT, [facility] Acute Rehab Services Pager [phone]

## 2014-03-29 ENCOUNTER — Encounter (HOSPITAL_COMMUNITY): Payer: Self-pay | Admitting: Cardiology

## 2014-03-29 LAB — HEPATITIS PANEL, ACUTE
HCV Ab: NEGATIVE
Hep A IgM: NONREACTIVE
Hep B C IgM: NONREACTIVE
Hepatitis B Surface Ag: NEGATIVE

## 2014-03-29 LAB — GLUCOSE, CAPILLARY
Glucose-Capillary: 303 mg/dL — ABNORMAL HIGH (ref 70–99)
Glucose-Capillary: 363 mg/dL — ABNORMAL HIGH (ref 70–99)
Glucose-Capillary: 244 mg/dL — ABNORMAL HIGH (ref 70–99)
Glucose-Capillary: 399 mg/dL — ABNORMAL HIGH (ref 70–99)

## 2014-03-29 LAB — CULTURE, BLOOD (ROUTINE X 2)
Culture: NO GROWTH
Culture: NO GROWTH

## 2014-03-29 LAB — HIV ANTIBODY (ROUTINE TESTING W REFLEX): HIV 1&2 Ab, 4th Generation: NONREACTIVE

## 2014-03-29 LAB — PROTIME-INR
INR: 2.04 — ABNORMAL HIGH (ref 0.00–1.49)
Prothrombin Time: 23.2 seconds — ABNORMAL HIGH (ref 11.6–15.2)

## 2014-03-29 MED ORDER — OXYCODONE HCL 5 MG PO TABS: 5.0000 mg | ORAL_TABLET | ORAL | Status: DC | PRN

## 2014-03-29 MED ORDER — WARFARIN SODIUM 4 MG PO TABS
4.0000 mg | ORAL_TABLET | Freq: Once | ORAL | Status: AC
  Administered 2014-03-29: 4 mg via ORAL
  Filled 2014-03-29: qty 1

## 2014-03-29 MED ORDER — CEFAZOLIN SODIUM-DEXTROSE 2-3 GM-% IV SOLR: 2.0000 g | Freq: Three times a day (TID) | INTRAVENOUS | Status: AC

## 2014-03-29 MED ORDER — INSULIN GLARGINE 100 UNIT/ML ~~LOC~~ SOLN
20.0000 [IU] | Freq: Every day | SUBCUTANEOUS | Status: DC
  Administered 2014-03-29: 20 [IU] via SUBCUTANEOUS
  Filled 2014-03-29 (×2): qty 0.2

## 2014-03-29 MED ORDER — POLYETHYLENE GLYCOL 3350 17 G PO PACK: 17.0000 g | PACK | Freq: Every day | ORAL | Status: DC | PRN

## 2014-03-29 MED ORDER — INSULIN GLARGINE 100 UNIT/ML ~~LOC~~ SOLN: 20.0000 [IU] | Freq: Every day | SUBCUTANEOUS | Status: DC

## 2014-03-29 NOTE — Clinical Social Work Note (Addendum)
Patient is now agreeable to SNF placement after meeting with PT. Patient chooses to go to [facility] where has been to rehab in the past.  CSW faxed clinicals to [facility] who can admit patient when ready for DC- [facility]  CSW will continue to follow.  Merlyn LotJenna Holoman, LCSWA Clinical Social Worker [phone]

## 2014-03-29 NOTE — Clinical Social Work Note (Signed)
Clinical Social Work Department BRIEF PSYCHOSOCIAL ASSESSMENT 03/29/2014  Patient:  Jordan [name],Jordan [name]     Account Number:  [id number]     Admit date:  03/20/2014  Clinical Social Worker:  Merlyn LotHOLOMAN,Athen Riel, CLINICAL SOCIAL WORKER  Date/Time:  03/28/2014 04:00 PM  Referred by:  Physician  Date Referred:  03/28/2014 Referred for  SNF Placement   Other Referral:   Interview type:  Patient Other interview type:   wife at bedside    PSYCHOSOCIAL DATA Living Status:  WIFE Admitted from facility:   Level of care:   Primary support name:  Jordan [name] Primary support relationship to patient:  SPOUSE Degree of support available:   high level of support    CURRENT CONCERNS Current Concerns  Post-Acute Placement   Other Concerns:    SOCIAL WORK ASSESSMENT / PLAN CSW spoke with patient and wife at bedside concerning recommendation for SNF.  [facility] but he felt that while at SNF he became more and more sick and only improved once he returned home.  Patient and wife feel comfortable going home with home health services and believe this will be more effective.   Assessment/plan status:  Psychosocial Support/Ongoing Assessment of Needs Other assessment/ plan:   none   Information/referral to community resources:   RNCM for home health services    PATIENT'S/FAMILY'S RESPONSE TO PLAN OF CARE: Patient and wife are not agreeable to PT recommendation for SNF and would like to go home with home health services.       Merlyn LotJenna Holoman, LCSWA Clinical Social Worker [phone]

## 2014-03-29 NOTE — Discharge Summary (Addendum)
Discharge Summary  Jordan [name] ZOX:[id number] DOB: 05-26-1940  PCP: [name], MD  Admit date: 03/20/2014 Anticipated Discharge date: 03/30/2014  Time spent: 25 minutes  Recommendations for Outpatient Follow-up:  1. New medication: IV Ancef 2 g every 8 hours to be continued until 04/17/13 2. Medication: OxyIR 5 mg every 4 hours as needed for pain 3. New medication: MiraLAX 17 g by mouth daily when necessary 4. Medication change: Remeron being discontinued 5. Medication change: Patient's insulin regimen being decreased to Lantus 20 units daily at bedtime  6. Patient being discharged to short-term skilled nursing 7. He will follow-up with his PCP in the next one month 8. Patient will follow up with Berna Spare duda, orthopedic surgery in the next 1 weeks  Discharge Diagnoses:  Active Hospital Problems   Diagnosis Date Noted  . SIRS (systemic inflammatory response syndrome) 03/21/2014  . Bacteremia   . Staphylococcus aureus bacteremia with sepsis   . Osteomyelitis of left foot 03/24/2014  . Foot infection 03/24/2014  . Chronic diastolic heart failure 03/23/2014  . Protein-calorie malnutrition, severe 03/21/2014  . Infected wound 03/20/2014  . Diabetic foot ulcer 01/24/2014  . PAD (peripheral artery disease) 03/02/2013  . DM (diabetes mellitus), type 2, uncontrolled, periph vascular complic 02/02/2009  . Hyperlipidemia 02/02/2009  . Essential hypertension 02/02/2009  . Coronary atherosclerosis 02/02/2009    Resolved Hospital Problems   Diagnosis Date Noted Date Resolved  No resolved problems to display.    Discharge Condition: Improved, being discharged to short-term skilled nursing  Diet recommendation: Carb modified, heart healthy  Filed Weights   03/20/14 1316 03/20/14 1702 03/22/14 0454  Weight: 91.173 kg (201 lb) 94.7 kg (208 lb 12.4 oz) 98.9 kg (218 lb 0.6 oz)    History of present illness:  73 year old male with past history of poorly controlled diabetes  mellitus with secondary peripheral vascular disease and CAD status post left arterial bypass in early November followed by partial foot amputation who has been following with vascular and orthopedic surgery and then presented to the emergency room after 1 week of overall malaise and 2 days of fevers, chills and elevated blood sugars as well as some wound dehiscence. Patient presented to the emergency room on 12/20 and noted to be afebrile with no leukocytosis, but foot x-ray noted area of lucency and possible osteomyelitis. Patient's admitted to the hospitalist service, started on broad-spectrum antibiotics.   Hospital Course:  Patient seen by orthopedist surgery who felt like the findings on x-ray were more related to chronic findings with no osteomyelitis. No abscess, cellulitis or drainage was noted. They felt the ulcerations were ischemic in patients be continued on nitroglycerin patch plus daily dressings. However this afternoon, patient's blood cultures have come back positive for 4/4 bottles of gram-positive cocci in clusters, which came back as staph aureus, not found resistant only to penicillin and tetracycline. ID consulted & 2D echo ordered. MRI of foot confirms abscess + osteomyelitis. Orthopedic surgery performed a BKA on the night of 12/24. Patient underwent TEE on 12/28 to rule out valvular vegetations and this was negative. Therefore, he is felt to only need 4 weeks total of IV antibiotics  Principal Problem:  SIRS (systemic inflammatory response syndrome) in the setting of with secondary diabetic foot ulcer and history of recent midfoot amputation w/osteomyelitis: Resolved. Vancomycin narrowed to Ancef. With BKA, underlying source for infection removed. With TEE being normal, patient will need 4 weeks of antibiotics total, stop on 1/16  Chronic diastolic heart failure: Noted on echocardiogram. BNP stable.  Already on ARB plus beta blocker  Active Problems:  DM (diabetes mellitus),  type 2, uncontrolled, periph vascular complic: A1c elevated, in the context of osteomyelitis. Now that he has undergone a BKA, likely his insulin requirements will go down significantly.Initially he was on 25 units of Lantus in the morning and 20 the evening. Blood glucose has been elevated, will change the Lantus to 20 units subcut Q 12 hrs.  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 This is making him somewhat drowsy so discontinuing this and continuing on OxyIR when necessary only. Follow-up with orthopedic surgery as outpatient. Patient will go from here to short-term skilled nursing  DVT prophylaxis- Will continue with aspirin 325 mg po daily. Called and discussed with Dr Ophelia Charter. Will discontinue the coumadin, which was started for VTE prophylaxis.   Hyperlipidemia: Continue statin  Essential hypertension: Blood pressures overall stable  Mild aortic stenosis: Noted on echocardiogram   Protein-calorie malnutrition, severe: Patient meets criteria for severe malnutrition of acute illness. Seen by nutrition. On boost twice a day during hospitalization, he will resume his dietary supplement on discharge  Shoulder pain: Patient complained of this initially. xrays done, no evidence of fracture or abnormal lucency  Consultants:  Orthopedic surgery   ID  Cardiology  Procedures:  TEE 12/28: No evidence of valvular vegetation, mild aortic stenosis noted  Status post left BKA done 12/25  2-D echo done 12/23: Grade 1 diastolic dysfunction, mild aortic stenosis noted  PICC line placed 12/26  Discharge Exam: BP 135/60 mmHg  Pulse 78  Temp(Src) 98.6 F (37 C) (Oral)  Resp 16  Ht 5\' 9"  (1.753 m)  Wt 98.9 kg (218 lb 0.6 oz)  BMI 32.18 kg/m2  SpO2 95%  General: Alert and oriented 3, no acute distress Cardiovascular: Regular rate and  rhythm, S1 and S2 Respiratory: Clear to auscultation bilaterally  Discharge Instructions You were cared for by a hospitalist during your hospital stay. If you have any questions about your discharge medications or the care you received while you were in the hospital after you are discharged, you can call the unit and asked to speak with the hospitalist on call if the hospitalist that took care of you is not available. Once you are discharged, your primary care physician will handle any further medical issues. Please note that NO REFILLS for any discharge medications will be authorized once you are discharged, as it is imperative that you return to your primary care physician (or establish a relationship with a primary care physician if you do not have one) for your aftercare needs so that they can reassess your need for medications and monitor your lab values.     Medication List    STOP taking these medications        insulin aspart 100 UNIT/ML injection  Commonly known as:  novoLOG     mirtazapine 7.5 MG tablet  Commonly known as:  REMERON     oxyCODONE-acetaminophen 5-325 MG per tablet  Commonly known as:  PERCOCET/ROXICET      TAKE these medications        acetaminophen 325 MG tablet  Commonly known as:  TYLENOL  Take 2 tablets (650 mg total) by mouth every 6 (six) hours as needed for mild pain (or Fever >/= 101).     albuterol (2.5 MG/3ML) 0.083% nebulizer solution  Commonly  known as:  PROVENTIL  Take 3 mLs (2.5 mg total) by nebulization every 6 (six) hours as needed for wheezing.     amLODipine 10 MG tablet  Commonly known as:  NORVASC  Take 10 mg by mouth daily.     aspirin 81 MG chewable tablet  Chew 81 mg by mouth daily.     ceFAZolin 2-3 GM-% Solr  Commonly known as:  ANCEF  Inject 50 mLs (2 g total) into the vein every 8 (eight) hours.     DSS 100 MG Caps  Take 100 mg by mouth 2 (two) times daily.     feeding supplement (GLUCERNA SHAKE) Liqd  Take 237 mLs by  mouth 2 (two) times daily between meals.     feeding supplement (PRO-STAT SUGAR FREE 64) Liqd  Take 30 mLs by mouth 2 (two) times daily between meals.     insulin glargine 100 UNIT/ML injection  Commonly known as:  LANTUS  Inject 0.2 mLs (20 Units total) into the skin at bedtime.     isosorbide mononitrate 30 MG 24 hr tablet  Commonly known as:  IMDUR  Take 30 mg by mouth daily.     losartan 100 MG tablet  Commonly known as:  COZAAR  Take 100 mg by mouth daily.     metoprolol succinate 100 MG 24 hr tablet  Commonly known as:  TOPROL-XL  Take 100 mg by mouth 2 (two) times daily.     nitroGLYCERIN 0.4 MG SL tablet  Commonly known as:  NITROSTAT  Place 0.4 mg under the tongue every 5 (five) minutes as needed for chest pain.     ondansetron 4 MG tablet  Commonly known as:  ZOFRAN  Take 1 tablet (4 mg total) by mouth every 6 (six) hours as needed for nausea.     oxyCODONE 5 MG immediate release tablet  Commonly known as:  Oxy IR/ROXICODONE  Take 1 tablet (5 mg total) by mouth every 4 (four) hours as needed for severe pain.     pantoprazole 40 MG tablet  Commonly known as:  PROTONIX  Take 1 tablet (40 mg total) by mouth daily.     polyethylene glycol packet  Commonly known as:  MIRALAX / GLYCOLAX  Take 17 g by mouth daily as needed for mild constipation.     rosuvastatin 40 MG tablet  Commonly known as:  CRESTOR  Take 40 mg by mouth every evening.     silver sulfADIAZINE 1 % cream  Commonly known as:  SILVADENE  Apply 1 application topically daily.       Allergies  Allergen Reactions  . Cilostazol Other (See Comments)    Feels like feet on fire       Follow-up Information    Follow up with [name], MD.   Specialty:  Orthopedic Surgery   Contact information:   [address] [place] [address] [place] [address] [phone]       Follow up with [facility], MD In 1 month.   Specialty:  Internal Medicine   Contact information:   [address] [facility] [place] [address] [phone]        The results of significant diagnostics from this hospitalization (including imaging, microbiology, ancillary and laboratory) are listed below for reference.    Significant Diagnostic Studies: Dg Shoulder Right  03/22/2014   CLINICAL DATA:  Right shoulder pain for 3 days, no known injury, bacteremia, initial encounter  EXAM: RIGHT SHOULDER - 2+  VIEW  COMPARISON:  None.  FINDINGS: Mild degenerative changes of the acromioclavicular joint are seen. No fracture or dislocation is seen. No bony erosions are noted.  IMPRESSION: No acute abnormality seen.   Electronically Signed   By: Alcide Clever M.D.   On: 03/22/2014 16:46   Ct Foot Left Wo Contrast  03/20/2014   CLINICAL DATA:  Patient is status post Chopart amputation of the left foot 02/02/2014 for gangrene. Now with fever and chills.  EXAM: CT OF THE LEFT FOOT WITHOUT CONTRAST  TECHNIQUE: Multidetector CT imaging of the left foot was performed according to the standard protocol. Multiplanar CT image reconstructions were also generated.  COMPARISON:  Plain films left foot 01/24/2014 and 03/20/2014.  FINDINGS: As seen on plain films, the patient is status post amputation of the foot with only small remnants of the cuneiforms and cuboid identified. Soft tissue gas is identified within the stump of the foot extending to the midbody of the plantar surface of the calcaneus. A skin ulceration is seen along the lateral margin of the stump. There appears to be a fluid collection measuring 3.0 cm craniocaudal by 1 cm long by approximately 3.4 cm transverse in the stump centered at the distal cuboid remnant.  There is bony destructive change in the remnant of the cuboid centrally and laterally highly suspicious for osteomyelitis. Multiple small bony fragments are seen in the soft tissues in the stump throughout. Subtle loss of cortical bone is also seen in the navicular distally.  IMPRESSION: Abnormal  gas in the soft tissues of the foot after amputation. Gastric should have resolved 1 month after surgery.  Skin wound at the amputation site with a likely large abscess in the stump centered about the cuboid.  Bony destructive change best seen in the cuboid and navicular highly suspicious for osteomyelitis.   Electronically Signed   By: Drusilla Kanner M.D.   On: 03/20/2014 21:01   Mr Foot Left Wo Contrast  03/22/2014   CLINICAL DATA:  Diabetic patient with a history of Chopart amputation of the left foot 02/02/2014 for gangrene. Fever and chills. Left foot pain and swelling.  EXAM: MRI OF THE LEFT FOREFOOT WITHOUT CONTRAST  TECHNIQUE: Multiplanar, multisequence MR imaging was performed. No intravenous contrast was administered.  COMPARISON:  CT scan of the left foot stress as CT scan of the left hindfoot 03/20/2014.  FINDINGS: There appear to be soft tissue wounds on the medial and lateral side of the patient's stump. Soft tissue edema about the patient's stump appears worse medially. There is a subcutaneous air and fluid collection in the medial plantar soft tissues measuring approximately 3.8 cm long by 1 cm craniocaudal by 3.2 cm transverse highly suspicious for abscess. There appears to be a second fluid collection extending into the abductor hallucis muscle measuring 2.1 cm craniocaudal by 1.2 cm transverse by 5.3 cm long. The muscle is markedly atrophic.  Marrow edema is seen throughout the cuboid. Bone fragment likely representing the remnant of the lateral cuneiform is also markedly edematous. Multiple small bony fragments in the stump more medially also demonstrate edema. Very mild edema is seen in the calcaneus at the calcaneocuboid joint with only trace amount of joint fluid present.  IMPRESSION: Findings most consistent with cellulitis the patient stump with marrow signal change consistent with osteomyelitis in the cuboid and remnants of the cuneiforms.  Air and fluid collection in the medial  plantar subcutaneous tissues is highly suspicious for abscess.  Fluid tracking in the flexor hallucis  muscle belly is also worrisome for abscess although edema could be secondary to denervation atrophy.   Electronically Signed   By: Drusilla Kanner M.D.   On: 03/22/2014 15:19   Dg Chest Port 1 View  03/20/2014   CLINICAL DATA:  Fever and chest pain for 1 week.  EXAM: PORTABLE CHEST - 1 VIEW  COMPARISON:  04/06/2008  FINDINGS: Chronic elevation of the right hemidiaphragm. Mild volume loss at the right lung base. Heart size is stable. Trachea is midline. No acute bone abnormality. No focal lung disease.  IMPRESSION: No acute chest findings.  Chronic elevation of the right hemidiaphragm.   Electronically Signed   By: Richarda Overlie M.D.   On: 03/20/2014 16:23   Dg Foot Complete Left  03/20/2014   CLINICAL DATA:  History of partial foot amputation. Small wound where the small toe was. Low grade fever.  EXAM: LEFT FOOT - COMPLETE 3+ VIEW  COMPARISON:  None.  FINDINGS: Patient had amputation in the mid tarsal region. There are small bone fragments near the amputation site which are probably postsurgical. The cortical margin of the remaining tarsal bones at the amputation site are ill-defined particularly at the cuboid. Difficult to exclude any erosions or cortical destruction in this area. No gross abnormality in the hindfoot.  IMPRESSION: Post amputation in the mid tarsal region. There is lucency along the lateral amputation site involving the cuboid. Findings could be related to osteopenia. Difficult to exclude cortical destruction or osteomyelitis in this area. This could be further evaluated with MRI.   Electronically Signed   By: Richarda Overlie M.D.   On: 03/20/2014 15:19    Microbiology: Recent Results (from the past 240 hour(s))  Culture, blood (routine x 2)     Status: None   Collection Time: 03/20/14  1:30 PM  Result Value Ref Range Status   Specimen Description BLOOD RIGHT WRIST  Final   Special  Requests BOTTLES DRAWN AEROBIC AND ANAEROBIC 5CCS  Final   Culture  Setup Time   Final    03/20/2014 23:45 Performed at [facility]    Culture   Final    STAPHYLOCOCCUS AUREUS Note: RIFAMPIN AND GENTAMICIN SHOULD NOT BE USED AS SINGLE DRUGS FOR TREATMENT OF STAPH INFECTIONS. Note: Gram Stain Report Called to,Read Back By and Verified With: CAROL HUCKABEE 03/21/14 1025 BY [email or web address] Performed at [facility]    Report Status 03/23/2014 FINAL  Final   Organism ID, Bacteria STAPHYLOCOCCUS AUREUS  Final      Susceptibility   Staphylococcus aureus - MIC*    CLINDAMYCIN <=0.25 SENSITIVE Sensitive     ERYTHROMYCIN <=0.25 SENSITIVE Sensitive     GENTAMICIN <=0.5 SENSITIVE Sensitive     LEVOFLOXACIN <=0.12 SENSITIVE Sensitive     OXACILLIN 0.5 SENSITIVE Sensitive     PENICILLIN >=0.5 RESISTANT Resistant     RIFAMPIN <=0.5 SENSITIVE Sensitive     TRIMETH/SULFA <=10 SENSITIVE Sensitive     VANCOMYCIN <=0.5 SENSITIVE Sensitive     TETRACYCLINE >=16 RESISTANT Resistant     MOXIFLOXACIN <=0.25 SENSITIVE Sensitive     * STAPHYLOCOCCUS AUREUS  Culture, blood (routine x 2)     Status: None   Collection Time: 03/20/14  2:11 PM  Result Value Ref Range Status   Specimen Description BLOOD LEFT ANTECUBITAL  Final   Special Requests BOTTLES DRAWN AEROBIC AND ANAEROBIC 3CCS  Final   Culture  Setup Time   Final    03/20/2014 23:45 Performed at [facility]  Culture   Final    STAPHYLOCOCCUS AUREUS Note: SUSCEPTIBILITIES PERFORMED ON PREVIOUS CULTURE WITHIN THE LAST 5 DAYS. Note: Gram Stain Report Called to,Read Back By and Verified With: CAROL HUCKABEE 03/21/14 1025 BY SMITHERSJ Culture results may be compromised due to an inadequate volume of blood received in culture bottles. Performed at [facility]    Report Status 03/23/2014 FINAL  Final  Urine culture     Status: None   Collection Time: 03/20/14  3:49 PM  Result Value Ref Range Status   Specimen  Description URINE, CLEAN CATCH  Final   Special Requests Normal  Final   Culture  Setup Time   Final    03/20/2014 21:30 Performed at [facility]    Colony Count NO GROWTH Performed at [facility]   Final   Culture NO GROWTH Performed at [facility]   Final   Report Status 03/21/2014 FINAL  Final  Culture, blood (routine x 2)     Status: None   Collection Time: 03/23/14  5:45 AM  Result Value Ref Range Status   Specimen Description BLOOD LEFT FOREARM  Final   Special Requests BOTTLES DRAWN AEROBIC AND ANAEROBIC 10CC  Final   Culture  Setup Time   Final    03/23/2014 11:23 Performed at [facility]    Culture   Final    NO GROWTH 5 DAYS Performed at [facility]    Report Status 03/29/2014 FINAL  Final  Culture, blood (routine x 2)     Status: None   Collection Time: 03/23/14  6:10 AM  Result Value Ref Range Status   Specimen Description BLOOD LEFT HAND  Final   Special Requests BOTTLES DRAWN AEROBIC ONLY 10CC  Final   Culture  Setup Time   Final    03/23/2014 11:22 Performed at [facility]    Culture   Final    NO GROWTH 5 DAYS Performed at [facility]    Report Status 03/29/2014 FINAL  Final  Surgical pcr screen     Status: None   Collection Time: 03/24/14  5:44 AM  Result Value Ref Range Status   MRSA, PCR NEGATIVE NEGATIVE Final   Staphylococcus aureus NEGATIVE NEGATIVE Final    Comment:        The Xpert SA Assay (FDA approved for NASAL specimens in patients over 73 years of age), is one component of a comprehensive surveillance program.  Test performance has been validated by [facility] for patients greater than or equal to 73 year old. It is not intended to diagnose infection nor to guide or monitor treatment.      Labs: Basic Metabolic Panel:  Recent Labs Lab 03/24/14 0640 03/24/14 1833 03/25/14 0430 03/26/14 0429 03/27/14 0520  NA 131*  --  137 135 136  K 3.0*  --  3.7 3.4*  3.2*  CL 95*  --  101 98 100  CO2 25  --  26 26 28   GLUCOSE 164*  --  138* 188* 201*  BUN 12  --  11 11 11   CREATININE 1.24 1.38* 1.29 1.42* 1.24  CALCIUM 8.3*  --  8.4 8.3* 8.7   Liver Function Tests: No results for input(s): AST, ALT, ALKPHOS, BILITOT, PROT, ALBUMIN in the last 168 hours. No results for input(s): LIPASE, AMYLASE in the last 168 hours. No results for input(s): AMMONIA in the last 168 hours. CBC:  Recent Labs Lab 03/24/14 0640 03/24/14 1833 03/25/14 0430  03/27/14 0520  WBC 10.4 10.0 10.1 10.1  HGB 8.7* 9.5* 8.7* 8.9*  HCT 27.7* 30.2* 28.4* 28.7*  MCV 79.4 81.8 80.7 82.0  PLT 464* 468* 485* 504*   Cardiac Enzymes: No results for input(s): CKTOTAL, CKMB, CKMBINDEX, TROPONINI in the last 168 hours. BNP: BNP (last 3 results) No results for input(s): PROBNP in the last 8760 hours. CBG:  Recent Labs Lab 03/28/14 1712 03/28/14 2128 03/29/14 0726 03/29/14 1147 03/29/14 1710  GLUCAP 262* 243* 303* 363* 244*       Signed:  KRISHNAN,SENDIL K  [facility] 03/29/2014, 8:16 PM

## 2014-03-29 NOTE — Clinical Social Work Note (Signed)
Patient refusing SNF- states that he declined during previous admission to SNF and only got better when he returned home.  [facility].  CSW signing off for now.  Merlyn LotJenna Holoman, LCSWA Clinical Social Worker [phone]

## 2014-03-29 NOTE — Progress Notes (Signed)
Physical Therapy Treatment Patient Details Name: Jordan [name] MRN: [id number] DOB: July 15, 1940 Today's Date: 03/29/2014    History of Present Illness Patient is a 73 yo male admitted 03/20/14 with SIRS.  Patient with infection and osteomyelitis in Lt foot.  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  PMH:  DM - poorly controlled, PVD, recent Lt foot partial amputation, CHF, CAD, HLD, Rt shoulder pain    PT Comments    Patient was seen this AM to determine if he would be safe to go home with HHPT or if SNF continued to be the preferred and safest discharge plan. At this time patient is +2 assist for transfers and understands that wife cannot assist at this level at home. Patient is agreeable to SNF and is hoping to return back to [facility] in [place]. CSW and Case Manager made aware of patient and wife decision. Will continue with current POC at this time.   Follow Up Recommendations  SNF;Supervision/Assistance - 24 hour     Equipment Recommendations  None recommended by PT    Recommendations for Other Services       Precautions / Restrictions Precautions Precautions: Fall    Mobility  Bed Mobility Overal bed mobility: Needs Assistance Bed Mobility: Supine to Sit     Supine to sit: Mod assist     General bed mobility comments: Verbal cues for technique.  Assist to move LLE and hips toward EOB using bed pad.  Assist to bring trunk to sitting position.  Patient with limited use of RUE to assist due to painful shoulder.    Transfers Overall transfer level: Needs assistance Equipment used: Rolling walker (2 wheeled) Transfers: Sit to/from Stand Sit to Stand: +2 physical assistance;Mod assist Stand pivot transfers: +2 physical assistance;Max assist       General transfer comment: +2 assist required for stand as patient attempting to lean far to L side. A to power up into standing and shift weight anteriorly and to R side. Cues for upright posture and to extend hips. Patient attempting to  shuffle R foot with pivot over to recliner but collasping with weight and requiring +2 Max assist to maintain support and balance to recliner. Patient stood x2 but fatiguing quickly with stand  Ambulation/Gait                 Stairs            Wheelchair Mobility    Modified Rankin (Stroke Patients Only)       Balance     Sitting balance-Leahy Scale: Fair                              Cognition Arousal/Alertness: Awake/alert Behavior During Therapy: WFL for tasks assessed/performed Overall Cognitive Status: Within Functional Limits for tasks assessed                      Exercises Amputee Exercises Quad Sets: AROM;Left;Seated;10 reps Hip ABduction/ADduction: AAROM;Left;10 reps Hip Flexion/Marching: AAROM;Left;10 reps Straight Leg Raises: AAROM;Left;10 reps    General Comments        Pertinent Vitals/Pain Pain Assessment: No/denies pain    Home Living                      Prior Function            PT Goals (current goals can now be found in the care plan section) Progress towards PT  goals: Progressing toward goals    Frequency  Min 3X/week    PT Plan Current plan remains appropriate    Co-evaluation             End of Session Equipment Utilized During Treatment: Gait belt Activity Tolerance: Patient tolerated treatment well Patient left: in chair;with call bell/phone within reach     Time: 6440-34741044-1113 PT Time Calculation (min) (ACUTE ONLY): 29 min  Charges:  $Therapeutic Exercise: 8-22 mins $Therapeutic Activity: 8-22 mins                    G Codes:      Fredrich BirksRobinette, Julia Elizabeth 03/29/2014, 11:18 AM 03/29/2014 Fredrich Birksobinette, Julia Elizabeth PTA [phone] pager [phone] office

## 2014-03-29 NOTE — Progress Notes (Signed)
ANTICOAGULATION CONSULT NOTE - Follow Up Consult  Pharmacy Consult for Coumadin Indication: VTE prophylaxis  Allergies  Allergen Reactions  . Cilostazol Other (See Comments)    Feels like feet on fire    Patient Measurements: Height: 5\' 9"  (175.3 cm) Weight: 218 lb 0.6 oz (98.9 kg) IBW/kg (Calculated) : 70.7 Heparin Dosing Weight:   Vital Signs: Temp: 98.8 F (37.1 C) (12/29 0542) Temp Source: Oral (12/29 0542) BP: 146/59 mmHg (12/29 0542) Pulse Rate: 80 (12/29 0542)  Labs:  Recent Labs  03/27/14 0520 03/28/14 0710 03/29/14 0500  HGB 8.9*  --   --   HCT 28.7*  --   --   PLT 504*  --   --   LABPROT 27.3* 26.1* 23.2*  INR 2.51* 2.37* 2.04*  CREATININE 1.24  --   --     Estimated Creatinine Clearance: 61.5 mL/min (by C-G formula based on Cr of 1.24).   Medications:  Scheduled:  . amLODipine  10 mg Oral Daily  . aspirin  81 mg Oral Daily  .  ceFAZolin (ANCEF) IV  2 g Intravenous 3 times per day  . docusate sodium  100 mg Oral BID  . feeding supplement (PRO-STAT SUGAR FREE 64)  30 mL Oral BID BM  . insulin aspart  0-15 Units Subcutaneous TID WC  . insulin aspart  0-5 Units Subcutaneous QHS  . insulin glargine  15 Units Subcutaneous QHS  . isosorbide mononitrate  30 mg Oral Daily  . lactose free nutrition  237 mL Oral BID WC  . losartan  100 mg Oral Daily  . metoprolol succinate  100 mg Oral BID  . mirtazapine  7.5 mg Oral QHS  . pantoprazole  40 mg Oral Daily  . rosuvastatin  40 mg Oral QPM  . sodium chloride  10-40 mL Intracatheter Q12H  . warfarin  4 mg Oral ONCE-1800  . Warfarin - Pharmacist Dosing Inpatient   Does not apply q1800    Assessment: 73yo male s/p L-foot partial amputation, now on Coumadin.  INR 2.04, within goal 2-3.   Hg 8.9, stable.  No bleeding noted.  Goal of Therapy:  INR 2-3 Monitor platelets by anticoagulation protocol: Yes   Plan:  Coumadin 4 mg today F/U daily INR and monitor for s/s of bleeding  Thanks for allowing  pharmacy to be a part of this patient's care.  Talbert CageLora Carter Kassel, PharmD Clinical Pharmacist, [phone]

## 2014-03-29 NOTE — Progress Notes (Signed)
Went to speak with patient and wife about HH needs for home.  Pt reports having used [facility] in the past and would prefer to remain with them if possible. Pt stated that he felt he wasn't being fed properly at the SNF when he went there after last admission.  Wife stated that the patient felt the PT in the SNF was to vigorous.  I explained to both of them the difference between amounts of nursing and [facility].  Pt had stated that he already knew how to get in/out of a car, in/out of the house, how to manage steps and how to get in/out of bed and anticipates no problems now.  The wife reminded the patient that he had done all of these things by using his left heel to aid in balance. I explained to the patient that he now will only have his LUE to use on that side after L BKA.   Patient has agreed to work with PT today and see how well he is able to do and then discuss with his wife if he really feels safe to maneuver at home and then let me know their decision.  Wife has already said that she can manage the IV antibiotics if that is what he needs for d/c.   Carlyle LipaMichelle Mick Tanguma, RN BSN MHA CCM  Case Manager, Trauma Service/Unit [facility] [phone]

## 2014-03-30 DIAGNOSIS — L97529 Non-pressure chronic ulcer of other part of left foot with unspecified severity: Secondary | ICD-10-CM

## 2014-03-30 LAB — PROTIME-INR
INR: 2.47 — ABNORMAL HIGH (ref 0.00–1.49)
Prothrombin Time: 26.9 seconds — ABNORMAL HIGH (ref 11.6–15.2)

## 2014-03-30 LAB — GLUCOSE, CAPILLARY
Glucose-Capillary: 344 mg/dL — ABNORMAL HIGH (ref 70–99)
Glucose-Capillary: 381 mg/dL — ABNORMAL HIGH (ref 70–99)

## 2014-03-30 MED ORDER — ASPIRIN EC 325 MG PO TBEC: 325.0000 mg | DELAYED_RELEASE_TABLET | Freq: Every day | ORAL | Status: DC

## 2014-03-30 MED ORDER — INSULIN GLARGINE 100 UNIT/ML ~~LOC~~ SOLN
20.0000 [IU] | Freq: Two times a day (BID) | SUBCUTANEOUS | Status: DC
  Filled 2014-03-30: qty 0.2

## 2014-03-30 MED ORDER — INSULIN GLARGINE 100 UNIT/ML ~~LOC~~ SOLN: 20.0000 [IU] | Freq: Two times a day (BID) | SUBCUTANEOUS | Status: DC

## 2014-03-30 MED ORDER — HEPARIN SOD (PORK) LOCK FLUSH 100 UNIT/ML IV SOLN: 250.0000 [IU] | INTRAVENOUS | Status: DC | PRN

## 2014-03-30 MED ORDER — WARFARIN SODIUM 4 MG PO TABS
4.0000 mg | ORAL_TABLET | Freq: Once | ORAL | Status: DC
  Filled 2014-03-30: qty 1

## 2014-03-30 NOTE — Clinical Social Work Note (Signed)
Patient will discharge to [facility] [place] Report #: [phone] Anticipated discharge date: 03/30/14 Family notified: wife at bedside Transportation by [facility]- called at 2:20  CSW signing off.  Merlyn LotJenna Holoman, LCSWA Clinical Social Worker [phone]

## 2014-03-30 NOTE — Progress Notes (Signed)
Physical Therapy Treatment Patient Details Name: Jordan [name] MRN: [id number] DOB: February 19, 1941 Today's Date: 03/30/2014    History of Present Illness Patient is a 73 yo male admitted 03/20/14 with SIRS.  Patient with infection and osteomyelitis in Lt foot.  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  PMH:  DM - poorly controlled, PVD, recent Lt foot partial amputation, CHF, CAD, HLD, Rt shoulder pain    PT Comments    Patient progressing well with transitions and stand this session. Awaiting confirmation from insurance for SNF. Continue with current POC  Follow Up Recommendations  SNF;Supervision/Assistance - 24 hour     Equipment Recommendations  None recommended by PT    Recommendations for Other Services       Precautions / Restrictions Precautions Precautions: Fall    Mobility  Bed Mobility Overal bed mobility: Needs Assistance Bed Mobility: Supine to Sit     Supine to sit: Min assist     General bed mobility comments: Verbal cues for technique.  Assist to move LLE and hips toward EOB using bed pad.  Heavy reliance on rails  Transfers Overall transfer level: Needs assistance Equipment used: Rolling walker (2 wheeled)   Sit to Stand: +2 physical assistance;Mod assist Stand pivot transfers: +2 physical assistance;Max assist       General transfer comment: +2 mod A for stand from bed. Only required Min A for up out of recliner. Patient with less L lean this session and more control with balance. Able to take 3 small pivotal hops to recliner this session  Ambulation/Gait                 Stairs            Wheelchair Mobility    Modified Rankin (Stroke Patients Only)       Balance                                    Cognition Arousal/Alertness: Awake/alert Behavior During Therapy: WFL for tasks assessed/performed Overall Cognitive Status: Within Functional Limits for tasks assessed                      Exercises      General  Comments        Pertinent Vitals/Pain Pain Score: 4  Pain Location: Rt shoulder Pain Descriptors / Indicators: Aching;Sore Pain Intervention(s): Monitored during session;Repositioned    Home Living                      Prior Function            PT Goals (current goals can now be found in the care plan section) Progress towards PT goals: Progressing toward goals    Frequency  Min 3X/week    PT Plan Current plan remains appropriate    Co-evaluation             End of Session Equipment Utilized During Treatment: Gait belt Activity Tolerance: Patient tolerated treatment well Patient left: in chair;with call bell/phone within reach     Time: 1150-1206 PT Time Calculation (min) (ACUTE ONLY): 16 min  Charges:  $Therapeutic Activity: 8-22 mins                    G Codes:      Fredrich BirksRobinette, Julia Elizabeth 03/30/2014, 3:09 PM 03/30/2014 Fredrich Birksobinette, Julia Elizabeth PTA [phone] pager [phone] office

## 2014-04-20 ENCOUNTER — Telehealth: Payer: Self-pay

## 2014-04-20 NOTE — Telephone Encounter (Signed)
Phone call from pt.  Reported he has had a left BKA since he saw Dr. Edilia Boickson in November.  Reported he saw Dr. Lajoyce Cornersuda yesterday, and it was noted there is a small ulcer on shin area of (L) LE; also reported a skin discoloration of approx. 3" length x 1" width in same area.  Was advised by Dr. Lajoyce Cornersuda to have this checked by his vascular doctor.   Denies any pain in left leg / BKA site.  Denies fever/ chills. Stated his right leg is "okay"; denies any problems with (R) LE.  Advised will move his appt. for Vascular studies and with Dr. Edilia Boickson to an earlier appt. Date.  Advised will contact him with this information.  Agrees with plan.  Instructed to call office is symptoms worsen before his appt.  Verb. Understanding.

## 2014-04-21 ENCOUNTER — Telehealth: Payer: Self-pay | Admitting: Vascular Surgery

## 2014-04-21 NOTE — Telephone Encounter (Signed)
notified patient of appt. on 05-04-14 at 11:30 and reminded him that if this area on his skin becomes worse that he is to call us back

## 2014-04-26 ENCOUNTER — Ambulatory Visit: Payer: Medicare HMO | Admitting: Family

## 2014-04-27 ENCOUNTER — Encounter: Payer: Self-pay | Admitting: Infectious Disease

## 2014-04-27 ENCOUNTER — Ambulatory Visit (INDEPENDENT_AMBULATORY_CARE_PROVIDER_SITE_OTHER): Payer: Medicare HMO | Admitting: Infectious Disease

## 2014-04-27 VITALS — BP 117/64 | HR 58 | Temp 98.1°F | Wt 194.0 lb

## 2014-04-27 DIAGNOSIS — E1169 Type 2 diabetes mellitus with other specified complication: Secondary | ICD-10-CM

## 2014-04-27 DIAGNOSIS — E1159 Type 2 diabetes mellitus with other circulatory complications: Secondary | ICD-10-CM

## 2014-04-27 DIAGNOSIS — L97529 Non-pressure chronic ulcer of other part of left foot with unspecified severity: Secondary | ICD-10-CM

## 2014-04-27 DIAGNOSIS — A4101 Sepsis due to Methicillin susceptible Staphylococcus aureus: Secondary | ICD-10-CM

## 2014-04-27 DIAGNOSIS — M869 Osteomyelitis, unspecified: Secondary | ICD-10-CM

## 2014-04-27 DIAGNOSIS — E1165 Type 2 diabetes mellitus with hyperglycemia: Secondary | ICD-10-CM

## 2014-04-27 DIAGNOSIS — E1151 Type 2 diabetes mellitus with diabetic peripheral angiopathy without gangrene: Secondary | ICD-10-CM

## 2014-04-27 DIAGNOSIS — E11621 Type 2 diabetes mellitus with foot ulcer: Secondary | ICD-10-CM | POA: Insufficient documentation

## 2014-04-27 DIAGNOSIS — IMO0002 Reserved for concepts with insufficient information to code with codable children: Secondary | ICD-10-CM

## 2014-04-27 DIAGNOSIS — M9080 Osteopathy in diseases classified elsewhere, unspecified site: Secondary | ICD-10-CM

## 2014-04-27 NOTE — Progress Notes (Signed)
   Subjective:    Patient ID: Jordan [name], male    DOB: [birth year], 74 y.o.   MRN: [id number]  HPI  Jordan [name] is a 74 y.o. male with DM, PVD, Diabetic foot ulcer,  MSSA bacteremia and osteomyelitis of foot sp BKA. He had TEE which did not show endocarditis but did show thickened leaflets of MV and AV.   I had wanted him to have 28 days minimum of abx with Day #1 being December 23rd (day of first negative blood cultures) but he received 26 days.   He has had some skin breakdown on knee and near his BKA site due to problem with fit of prosthesis, but no frank purulence.  Review of Systems  Constitutional: Negative for fever, chills, diaphoresis, activity change, appetite change, fatigue and unexpected weight change.  HENT: Negative for congestion, rhinorrhea, sinus pressure, sneezing, sore throat and trouble swallowing.   Eyes: Negative for photophobia and visual disturbance.  Respiratory: Negative for cough, chest tightness, shortness of breath, wheezing and stridor.   Cardiovascular: Negative for chest pain, palpitations and leg swelling.  Gastrointestinal: Negative for nausea, vomiting, abdominal pain, diarrhea, constipation, blood in stool, abdominal distention and anal bleeding.  Genitourinary: Negative for dysuria, hematuria, flank pain and difficulty urinating.  Musculoskeletal: Positive for myalgias.  Skin: Positive for rash and wound. Negative for color change and pallor.  Neurological: Negative for dizziness, tremors, weakness and light-headedness.  Hematological: Negative for adenopathy. Does not bruise/bleed easily.  Psychiatric/Behavioral: Negative for behavioral problems, confusion, sleep disturbance, dysphoric mood, decreased concentration and agitation.       Objective:   Physical Exam  Constitutional: He is oriented to person, place, and time. He appears well-developed and well-nourished.  HENT:  Head: Normocephalic and atraumatic.  Eyes: Conjunctivae and EOM are normal.   Neck: Normal range of motion. Neck supple.  Cardiovascular: Normal rate and regular rhythm.   Pulmonary/Chest: Effort normal. No respiratory distress. He has no wheezes.  Abdominal: Soft. He exhibits no distension.  Musculoskeletal: Normal range of motion. He exhibits no edema or tenderness.  Neurological: He is alert and oriented to person, place, and time.  Skin: Skin is warm and dry.  Psychiatric: He has a normal mood and affect. His behavior is normal.   Knee with BKA site 04/27/14:          Assessment & Plan:   MSSA bacteremia with septic shock, and osteomyelitis of diabetic foot ulcer, sp TEE (negative for vegetations) and BKA by Orthopedics:  --we will bring pt back in 3 weeks to check on the postop site (he has fu with [facility] and Orthopedics to follow closely)  --we will check surveillance blood cultures for cure of bacteremia  DM poorly controlled with vascular complications to followup with PCP, [facility]

## 2014-05-03 ENCOUNTER — Other Ambulatory Visit: Payer: Self-pay | Admitting: *Deleted

## 2014-05-03 ENCOUNTER — Encounter: Payer: Self-pay | Admitting: Family

## 2014-05-03 DIAGNOSIS — Z48812 Encounter for surgical aftercare following surgery on the circulatory system: Secondary | ICD-10-CM

## 2014-05-03 DIAGNOSIS — I739 Peripheral vascular disease, unspecified: Secondary | ICD-10-CM

## 2014-05-04 ENCOUNTER — Ambulatory Visit: Payer: Medicare HMO | Admitting: Family

## 2014-05-04 ENCOUNTER — Ambulatory Visit (HOSPITAL_COMMUNITY)
Admission: RE | Admit: 2014-05-04 | Discharge: 2014-05-04 | Disposition: A | Discharge status: Home / Self Care | Payer: Medicare HMO | Source: Ambulatory Visit | Attending: Family | Admitting: Family

## 2014-05-04 ENCOUNTER — Ambulatory Visit (INDEPENDENT_AMBULATORY_CARE_PROVIDER_SITE_OTHER): Payer: Medicare HMO | Admitting: Family

## 2014-05-04 ENCOUNTER — Encounter: Payer: Self-pay | Admitting: Family

## 2014-05-04 VITALS — BP 117/63 | HR 60 | Temp 98.0°F | Resp 16 | Ht 69.0 in | Wt 194.0 lb

## 2014-05-04 DIAGNOSIS — I739 Peripheral vascular disease, unspecified: Secondary | ICD-10-CM

## 2014-05-04 DIAGNOSIS — E1165 Type 2 diabetes mellitus with hyperglycemia: Secondary | ICD-10-CM

## 2014-05-04 DIAGNOSIS — Z89522 Acquired absence of left knee: Secondary | ICD-10-CM

## 2014-05-04 DIAGNOSIS — Z89512 Acquired absence of left leg below knee: Secondary | ICD-10-CM

## 2014-05-04 DIAGNOSIS — E1159 Type 2 diabetes mellitus with other circulatory complications: Secondary | ICD-10-CM

## 2014-05-04 DIAGNOSIS — Z48812 Encounter for surgical aftercare following surgery on the circulatory system: Secondary | ICD-10-CM

## 2014-05-04 DIAGNOSIS — IMO0002 Reserved for concepts with insufficient information to code with codable children: Secondary | ICD-10-CM

## 2014-05-04 DIAGNOSIS — Z87891 Personal history of nicotine dependence: Secondary | ICD-10-CM

## 2014-05-04 DIAGNOSIS — E1151 Type 2 diabetes mellitus with diabetic peripheral angiopathy without gangrene: Secondary | ICD-10-CM

## 2014-05-04 NOTE — Progress Notes (Signed)
[facility] [place] HISTORY AND PHYSICAL -PAD  History of Present Illness Jordan [name] is a 74 y.o. male patient of Dr. Edilia Bo who had presented with an extensive diabetic foot infection of the left foot. He had extensive infrainguinal arterial occlusive disease and was not a candidate for an endovascular approach. He underwent left common femoral artery to anterior tibial artery bypass with a composite PTFE left greater saphenous vein graft on 01/28/2014. 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 Subsequently the foot wound was managed by Dr. Aldean Baker. He returns today reffered by Dr. Lajoyce Corners, to check leg circulation. 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 Lajoyce Corners for sepsis and left foot infection. 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  2 blistered areas below left knee are improving with silvadene topical dressing changes. He is starting to use a stump shrinker, has connected with [employer].  The patient denies New Medical or Surgical History. Pt denies any hx of stroke or TIA. He walks with a walker, denies claudication symptoms in right leg, scaling in right heel is improving, macerated area between right 4th and 5th toes is improving with Lamisil use.  Pt Diabetic: Yes, pt states his last A1C was 8.1 Pt smoker: former smoker, quit in the 1980's  Pt meds include: Statin :Yes Betablocker: Yes ASA: Yes Other anticoagulants/antiplatelets: no  Past Medical History  Diagnosis Date  . CAD (coronary artery disease)     a. s/p IMI in past tx with POBA and cath 6 mos later with occluded RCA;  b. h/o Taxus DES to LAD and CFX;  c.  cath 1/10: LM ok, mLAD 40-50%, LAD stent ok, D2 tandem 60-70%, pCFX 30%, mAVCFX stent ok, pRCA 40%, RV 90%, RV marginal 90%, dRCA filled L-R collats tx medically  . Carotid stenosis     dopplers 5/11: 40-59% bilat  . DM2 (diabetes mellitus, type 2)   .  HTN (hypertension)   . HLD (hyperlipidemia)   . Diabetes mellitus   . Staphylococcus aureus bacteremia with sepsis   . Osteomyelitis of left foot   . Diabetic ulcer of left foot   . Anemia   . Heart murmur   . CHF (congestive heart failure)   . Myocardial infarction 1995 and 2005    Heart Attack    Social History History  Substance Use Topics  . Smoking status: Former Smoker    Types: Pipe    Quit date: 02/18/2011  . Smokeless tobacco: Never Used  . Alcohol Use: No    Family History Family History  Problem Relation Age of Onset  . Diabetes Mother   . Cancer Mother     Pancreatic  . Heart disease Mother     After age 2  . Hypertension Mother   . Diabetes Sister   . Cancer Sister     Bone    Past Surgical History  Procedure Laterality Date  . Cardiac catheterization    . Femoral-tibial bypass graft Left 01/28/2014    Procedure:  FEMORAL-ANTERIOR TIBIAL ARTERY Bypass Graft utilizing composite gortex graft and vein graft;  Surgeon: [name], MD;  Location: [facility] OR;  Service: Vascular;  Laterality: Left;  . Amputation Left 01/28/2014    Procedure: AMPUTATION DIGIT- LEFT 5TH TOE;  Surgeon: [name], MD;  Location: [facility] OR;  Service: Vascular;  Laterality: Left;  . Amputation Left 02/02/2014  Procedure: Left 4th Ray Amputation vs Transmetatarsal Amputation;  Surgeon: Nadara MustardMarcus Duda V, MD;  [facility];  Service: Orthopedics;  Laterality: Left;  . Abdominal aortagram N/A 04/07/2013    Procedure: ABDOMINAL Ronny FlurryAORTAGRAM;  Surgeon: Iran OuchMuhammad A Arida, MD;  [facility];  Service: Cardiovascular;  Laterality: N/A;  . Lower extremity angiogram Left 01/26/2014    Procedure: LOWER EXTREMITY ANGIOGRAM;  Surgeon: Iran OuchMuhammad A Arida, MD;  Location: [facility] CATH LAB;  Service: Cardiovascular;  Laterality: Left;  . Abdominal aortagram N/A 01/26/2014    Procedure: ABDOMINAL AORTAGRAM;  Surgeon: Iran OuchMuhammad A Arida, MD;  [facility];  Service: Cardiovascular;   Laterality: N/A;  . Amputation Left 03/24/2014    Procedure: AMPUTATION BELOW KNEE;  Surgeon: Cammy CopaGregory Scott Dean, MD;  [facility];  Service: Orthopedics;  Laterality: Left;  . Tee without cardioversion N/A 03/28/2014    Procedure: TRANSESOPHAGEAL ECHOCARDIOGRAM (TEE);  Surgeon: [name], MD;  [facility];  Service: Cardiovascular;  Laterality: N/A;    Allergies  Allergen Reactions  . Cilostazol Other (See Comments)    Feels like feet on fire    Current Outpatient Prescriptions  Medication Sig Dispense Refill  . acetaminophen (TYLENOL) 325 MG tablet Take 2 tablets (650 mg total) by mouth every 6 (six) hours as needed for mild pain (or Fever >/= 101).    [name]. albuterol (PROVENTIL) (2.5 MG/3ML) 0.083% nebulizer solution Take 3 mLs (2.5 mg total) by nebulization every 6 (six) hours as needed for wheezing. 75 mL 12  . amLODipine (NORVASC) 10 MG tablet Take 10 mg by mouth daily.    [name]. aspirin 81 MG tablet Take 81 mg by mouth daily.    [name]. docusate sodium 100 MG CAPS Take 100 mg by mouth 2 (two) times daily. 10 capsule 0  . escitalopram (LEXAPRO) 10 MG tablet Take 10 mg by mouth at bedtime.     . feeding supplement, GLUCERNA SHAKE, (GLUCERNA SHAKE) LIQD Take 237 mLs by mouth 2 (two) times daily between meals.  0  . gabapentin (NEURONTIN) 300 MG capsule Take 300 mg by mouth as needed.    . insulin aspart (NOVOLOG) 100 UNIT/ML injection Inject into the skin. Sliding Scale    . Insulin Detemir (LEVEMIR ) Inject 25 Units into the skin 2 (two) times daily.    . isosorbide mononitrate (IMDUR) 30 MG 24 hr tablet Take 30 mg by mouth daily.    [name]. losartan (COZAAR) 100 MG tablet Take 100 mg by mouth daily.    . metoprolol (TOPROL-XL) 100 MG 24 hr tablet Take 100 mg by mouth 2 (two) times daily.      . nitroGLYCERIN (NITROSTAT) 0.4 MG SL tablet Place 0.4 mg under the tongue every 5 (five) minutes as needed for chest pain.     [name]. ondansetron (ZOFRAN) 4 MG tablet Take 1 tablet (4 mg total)  by mouth every 6 (six) hours as needed for nausea. 20 tablet 0  . oxyCODONE (OXY IR/ROXICODONE) 5 MG immediate release tablet Take 1 tablet (5 mg total) by mouth every 4 (four) hours as needed for severe pain. 30 tablet 0  . OxyCODONE (OXYCONTIN) 20 mg T12A 12 hr tablet Take 20 mg by mouth as needed.    . polyethylene glycol (MIRALAX / GLYCOLAX) packet Take 17 g by mouth daily as needed for mild constipation. 14 each 0  . rosuvastatin (CRESTOR) 40 MG tablet Take 40 mg by mouth every evening.    . silver sulfADIAZINE (SILVADENE) 1 %  cream Apply 1 application topically daily.  3  . Amino Acids-Protein Hydrolys (FEEDING SUPPLEMENT, PRO-STAT SUGAR FREE 64,) LIQD Take 30 mLs by mouth 2 (two) times daily between meals. (Patient not taking: Reported on 05/04/2014) 900 mL 0  . aspirin EC 325 MG tablet Take 1 tablet (325 mg total) by mouth daily. (Patient not taking: Reported on 05/04/2014) 30 tablet 0  . insulin glargine (LANTUS) 100 UNIT/ML injection Inject 0.2 mLs (20 Units total) into the skin 2 (two) times daily. (Patient not taking: Reported on 05/04/2014) 10 mL 11  . nitroGLYCERIN (NITRODUR - DOSED IN MG/24 HR) 0.2 mg/hr patch   3  . oxyCODONE (OXY IR/ROXICODONE) 5 MG immediate release tablet 1-2 Q4H PRN PAIN    . pantoprazole (PROTONIX) 40 MG tablet Take 1 tablet (40 mg total) by mouth daily. (Patient not taking: Reported on 05/04/2014)     No current facility-administered medications for this visit.    ROS: See HPI for pertinent positives and negatives.   Physical Examination  Filed Vitals:   05/04/14 1247  BP: 117/63  Pulse: 60  Temp: 98 F (36.7 C)  TempSrc: Oral  Resp: 16  Height:  (1.753 m)  Weight: 194 lb (87.998 kg)  SpO2: 99%   Body mass index is 28.64 kg/(m^2).  General: A&O x 3, WDWN. Gait: in wheelchair Eyes: PERRLA. Pulmonary: CTAB, without wheezes , rales or rhonchi. Cardiac: regular Rythm , without detected murmur.         Carotid Bruits Right Left   Negative  Negative  Aorta is not palpable.                       VASCULAR EXAM: Extremities with ischemic changes: 2 dermal layer deep lesions below left knee at anterior BKA stump, appears to be granulating well with no evidence of infection;  Epidermal layer deep abrasion at left patella, granulating well, no evidence of infection. Scaling at right heel with no fissures. Fungal infection between right 4th and 5th toes is minor. without Gangrene. Steri strips in place at left BKA site, dry, no drainage.                                                                                                          LE Pulses Right Left       FEMORAL  faintly palpable  faintly palpable        POPLITEAL  not palpable   monophasic by Doppler       POSTERIOR TIBIAL  non-Dopplerable   BKA        DORSALIS PEDIS      ANTERIOR TIBIAL Monophasic by Doppler    BKA        PERONEAL monophasic by Doppler   BKA   Abdomen: soft, NT, no palpable masses. Skin: no rashes, see extremities. Musculoskeletal: no muscle wasting or atrophy. Left BKA.  Neurologic: A&O X 3; Appropriate Affect ; SENSATION: normal; MOTOR FUNCTION:  moving all extremities equally, motor strength 4/5 throughout. Speech is fluent/normal.  CN 2-12  is grossly intact.    ASSESSMENT: Jordan [name] is a 74 y.o. male  who had presented with an extensive diabetic foot infection of the left foot. He had extensive infrainguinal arterial occlusive disease and was not a candidate for an endovascular approach. He underwent left common femoral artery to anterior tibial artery bypass with a composite PTFE left greater saphenous vein graft on 01/28/2014. 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 Subsequently the foot wound was managed by Dr. Aldean Baker. He returns today reffered by Dr. Lajoyce Corners, to check leg circulation. 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  2 blistered areas below left knee are improving with silvadene topical dressing changes. He is starting to use a stump shrinker, has connected with [employer].  Dr. Edilia Bo examined pt and spoke with pt and wife. Bilateral femoral pulses are faintly palpable, left popliteal pulse is monophasic by Doppler.  Blisters are healing below left knee with Silvadene use, continue this. Right heel scaling is improving. Right foot tinea pedis is improving with Lamisil uses between 4th and 5th toes.  May use stump shrinker, but not too tight, may be snug. Hold off on prosthesis fitting until all wounds are well healed in left LE. Pt expressed wish to continue to be monitored in his right LE for PAD.  Face to face time with patient was 25 minutes. Over 50% of this time was spent on counseling and coordination of care.    PLAN:  Pt is advised to work closely with his medical provider that helps him manage his DM to get his DM under as good control as possible; DM is his most prominent atherosclerotic risk factor.  Pt is also advised to perform the exercises as instructed by physical therapy to help improve his arterial perfusion. I discussed in depth with the patient the nature of atherosclerosis, and emphasized the importance of maximal medical management including strict control of blood pressure, blood glucose, and lipid levels, obtaining regular exercise, and continued cessation of smoking.  The patient is aware that without maximal medical management the underlying atherosclerotic disease process will progress, limiting the benefit of any interventions.  Based on the patient's vascular studies and examination, pt will return to clinic in 1 month with right LE ABI.   The patient was given information about PAD including signs, symptoms, treatment, what symptoms should prompt the patient to seek immediate medical care, and risk reduction measures to  take.  Charisse March, RN, MSN, FNP-C [facility] Phone: [phone]  Clinic MD: Edilia Bo  05/04/2014  12:59 PM

## 2014-05-04 NOTE — Patient Instructions (Signed)

## 2014-05-18 ENCOUNTER — Ambulatory Visit: Payer: Medicare HMO | Admitting: Vascular Surgery

## 2014-05-18 ENCOUNTER — Encounter (HOSPITAL_COMMUNITY): Payer: Medicare HMO

## 2014-05-18 ENCOUNTER — Other Ambulatory Visit (HOSPITAL_COMMUNITY): Payer: Medicare HMO

## 2014-05-19 ENCOUNTER — Ambulatory Visit (INDEPENDENT_AMBULATORY_CARE_PROVIDER_SITE_OTHER): Payer: Medicare HMO | Admitting: Infectious Disease

## 2014-05-19 ENCOUNTER — Encounter: Payer: Self-pay | Admitting: Infectious Disease

## 2014-05-19 VITALS — BP 147/75 | HR 57 | Temp 98.0°F | Ht 69.0 in | Wt 195.0 lb

## 2014-05-19 DIAGNOSIS — M869 Osteomyelitis, unspecified: Secondary | ICD-10-CM

## 2014-05-19 DIAGNOSIS — E08621 Diabetes mellitus due to underlying condition with foot ulcer: Secondary | ICD-10-CM

## 2014-05-19 DIAGNOSIS — E1159 Type 2 diabetes mellitus with other circulatory complications: Secondary | ICD-10-CM

## 2014-05-19 DIAGNOSIS — E1165 Type 2 diabetes mellitus with hyperglycemia: Secondary | ICD-10-CM

## 2014-05-19 DIAGNOSIS — M9080 Osteopathy in diseases classified elsewhere, unspecified site: Secondary | ICD-10-CM

## 2014-05-19 DIAGNOSIS — IMO0002 Reserved for concepts with insufficient information to code with codable children: Secondary | ICD-10-CM

## 2014-05-19 DIAGNOSIS — L97529 Non-pressure chronic ulcer of other part of left foot with unspecified severity: Secondary | ICD-10-CM

## 2014-05-19 DIAGNOSIS — E1169 Type 2 diabetes mellitus with other specified complication: Secondary | ICD-10-CM

## 2014-05-19 DIAGNOSIS — A4101 Sepsis due to Methicillin susceptible Staphylococcus aureus: Secondary | ICD-10-CM

## 2014-05-19 DIAGNOSIS — E1151 Type 2 diabetes mellitus with diabetic peripheral angiopathy without gangrene: Secondary | ICD-10-CM

## 2014-05-19 NOTE — Progress Notes (Signed)
   Subjective:    Patient ID: Jordan [name], male    DOB: [birth year], 74 y.o.   MRN: [id number]  HPI   Spieker is a 74 y.o. male with DM, PVD, Diabetic foot ulcer,  MSSA bacteremia and osteomyelitis of foot sp BKA. He had TEE which did not show endocarditis but did show thickened leaflets of MV and AV.   I had wanted him to have 28 days minimum of abx with Day #1 being December 23rd (day of first negative blood cultures) but he received 26 days.   He has had some skin breakdown on knee and near his BKA site due to problem with fit of prosthesis, but no frank purulence.   Since I saw him last that area has healed up but he still has an area that has developed that has exudative material on it. He is without fevers, nausea or malaise.  Review of Systems  Constitutional: Negative for fever, chills, diaphoresis, activity change, appetite change, fatigue and unexpected weight change.  HENT: Negative for congestion, rhinorrhea, sinus pressure, sneezing, sore throat and trouble swallowing.   Eyes: Negative for photophobia and visual disturbance.  Respiratory: Negative for cough, chest tightness, shortness of breath, wheezing and stridor.   Cardiovascular: Negative for chest pain, palpitations and leg swelling.  Gastrointestinal: Negative for nausea, vomiting, abdominal pain, diarrhea, constipation, blood in stool, abdominal distention and anal bleeding.  Genitourinary: Negative for dysuria, hematuria, flank pain and difficulty urinating.  Musculoskeletal: Positive for myalgias.  Skin: Positive for rash and wound. Negative for color change and pallor.  Neurological: Negative for dizziness, tremors, weakness and light-headedness.  Hematological: Negative for adenopathy. Does not bruise/bleed easily.  Psychiatric/Behavioral: Negative for behavioral problems, confusion, sleep disturbance, dysphoric mood, decreased concentration and agitation.       Objective:   Physical Exam  Constitutional: He  is oriented to person, place, and time. He appears well-developed and well-nourished.  HENT:  Head: Normocephalic and atraumatic.  Eyes: Conjunctivae and EOM are normal.  Neck: Normal range of motion. Neck supple.  Cardiovascular: Normal rate and regular rhythm.   Pulmonary/Chest: Effort normal. No respiratory distress. He has no wheezes.  Abdominal: Soft. He exhibits no distension.  Musculoskeletal: Normal range of motion. He exhibits no edema or tenderness.  Neurological: He is alert and oriented to person, place, and time.  Skin: Skin is warm and dry.  Psychiatric: He has a normal mood and affect. His behavior is normal.   Knee with BKA site 04/27/14:     05/19/14:          Left foot with scaling on heel  05/19/14:          Assessment & Plan:   MSSA bacteremia with septic shock, and osteomyelitis of diabetic foot ulcer, sp TEE (negative for vegetations) and BKA by Orthopedics:  I dont like the new wound but it is NOT purulent.   I want pt to followup with Meridee Score and with [facility] closely and followup with [place] in one month  Will also check ESR and CRP  I spent greater than 25 minutes with the patient including greater than 50% of time in face to face counsel of the patient and in coordination of their care.   DM poorly controlled with vascular complications to followup with PCP, [facility]. Opposite foot without ulcers but scaling dry skin

## 2014-05-20 LAB — C-REACTIVE PROTEIN: CRP: <0.5 mg/dL (ref <0.60)

## 2014-05-20 LAB — SEDIMENTATION RATE: Sed Rate: 27 mm/hr — ABNORMAL HIGH (ref 0–20)

## 2014-06-01 ENCOUNTER — Encounter: Payer: Self-pay | Admitting: Family

## 2014-06-02 ENCOUNTER — Ambulatory Visit (HOSPITAL_COMMUNITY)
Admission: RE | Admit: 2014-06-02 | Discharge: 2014-06-02 | Disposition: A | Discharge status: Home / Self Care | Payer: Medicare PPO | Source: Ambulatory Visit | Attending: Family | Admitting: Family

## 2014-06-02 ENCOUNTER — Encounter: Payer: Self-pay | Admitting: Family

## 2014-06-02 ENCOUNTER — Other Ambulatory Visit: Payer: Self-pay | Admitting: Family

## 2014-06-02 ENCOUNTER — Ambulatory Visit (INDEPENDENT_AMBULATORY_CARE_PROVIDER_SITE_OTHER): Payer: Medicare PPO | Admitting: Family

## 2014-06-02 VITALS — BP 122/60 | HR 57 | Resp 16 | Ht 69.0 in | Wt 194.0 lb

## 2014-06-02 DIAGNOSIS — E1159 Type 2 diabetes mellitus with other circulatory complications: Secondary | ICD-10-CM | POA: Diagnosis not present

## 2014-06-02 DIAGNOSIS — L989 Disorder of the skin and subcutaneous tissue, unspecified: Secondary | ICD-10-CM | POA: Diagnosis present

## 2014-06-02 DIAGNOSIS — R234 Changes in skin texture: Secondary | ICD-10-CM

## 2014-06-02 DIAGNOSIS — I739 Peripheral vascular disease, unspecified: Secondary | ICD-10-CM | POA: Diagnosis not present

## 2014-06-02 DIAGNOSIS — Z87891 Personal history of nicotine dependence: Secondary | ICD-10-CM | POA: Diagnosis not present

## 2014-06-02 DIAGNOSIS — Z89512 Acquired absence of left leg below knee: Secondary | ICD-10-CM

## 2014-06-02 DIAGNOSIS — E1151 Type 2 diabetes mellitus with diabetic peripheral angiopathy without gangrene: Secondary | ICD-10-CM

## 2014-06-02 DIAGNOSIS — Z89522 Acquired absence of left knee: Secondary | ICD-10-CM

## 2014-06-02 DIAGNOSIS — Z48812 Encounter for surgical aftercare following surgery on the circulatory system: Secondary | ICD-10-CM

## 2014-06-02 DIAGNOSIS — L24A9 Irritant contact dermatitis due friction or contact with other specified body fluids: Secondary | ICD-10-CM | POA: Insufficient documentation

## 2014-06-02 DIAGNOSIS — E1165 Type 2 diabetes mellitus with hyperglycemia: Secondary | ICD-10-CM

## 2014-06-02 DIAGNOSIS — T148XXA Other injury of unspecified body region, initial encounter: Secondary | ICD-10-CM | POA: Insufficient documentation

## 2014-06-02 DIAGNOSIS — L8990 Pressure ulcer of unspecified site, unspecified stage: Secondary | ICD-10-CM | POA: Insufficient documentation

## 2014-06-02 DIAGNOSIS — IMO0002 Reserved for concepts with insufficient information to code with codable children: Secondary | ICD-10-CM

## 2014-06-02 NOTE — Progress Notes (Signed)
[facility] [place] HISTORY AND PHYSICAL -PAD  History of Present Illness Jordan [name] is a 74 y.o. male patient of Dr. Edilia Bo who had presented with an extensive diabetic foot infection of the left foot. He had extensive infrainguinal arterial occlusive disease and was not a candidate for an endovascular approach. He underwent left common femoral artery to anterior tibial artery bypass with a composite PTFE left greater saphenous vein graft on 01/28/2014. 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 Subsequently the foot wound was managed by Dr. Aldean Baker. 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 He returns today with for 1 mo check for circulation of Right leg and C/O Right lateral foot/heel 2 fissures, and drainage, 2 days. Home health sees him 3 days/week, he is receiving physical therapy. He saw Dr. Lajoyce Corners this morning and the 2 dark shallow fissures were not present at that time per pt. Dr. Lajoyce Corners is managing his left BKA stump.  Pt denies any hx of stroke or TIA. He walks with a walker, denies claudication symptoms in right leg, scaling in right heel continues, macerated area between right 4th and 5th toes resolved with Lamisil use.  Pt Diabetic: Yes, pt states his last A1C was 8.1 Pt smoker: former smoker, quit in the 1980's  Pt meds include: Statin :Yes Betablocker: Yes ASA: Yes Other anticoagulants/antiplatelets: no   Past Medical History  Diagnosis Date  . CAD (coronary artery disease)     a. s/p IMI in past tx with POBA and cath 6 mos later with occluded RCA;  b. h/o Taxus DES to LAD and CFX;  c.  cath 1/10: LM ok, mLAD 40-50%, LAD stent ok, D2 tandem 60-70%, pCFX 30%, mAVCFX stent ok, pRCA 40%, RV 90%, RV marginal 90%, dRCA filled L-R collats tx medically  . Carotid stenosis     dopplers 5/11: 40-59% bilat  . DM2 (diabetes mellitus, type 2)   . HTN (hypertension)   . HLD (hyperlipidemia)   . Diabetes mellitus   .  Staphylococcus aureus bacteremia with sepsis   . Osteomyelitis of left foot   . Diabetic ulcer of left foot   . Anemia   . Heart murmur   . CHF (congestive heart failure)   . Myocardial infarction 1995 and 2005    Heart Attack    Social History History  Substance Use Topics  . Smoking status: Former Smoker    Types: Pipe    Quit date: 02/18/2011  . Smokeless tobacco: Never Used  . Alcohol Use: No    Family History Family History  Problem Relation Age of Onset  . Diabetes Mother   . Cancer Mother     Pancreatic  . Heart disease Mother     After age 43  . Hypertension Mother   . Diabetes Sister   . Cancer Sister     Bone    Past Surgical History  Procedure Laterality Date  . Cardiac catheterization    . Femoral-tibial bypass graft Left 01/28/2014    Procedure:  FEMORAL-ANTERIOR TIBIAL ARTERY Bypass Graft utilizing composite gortex graft and vein graft;  Surgeon: [name], MD;  Location: [facility] OR;  Service: Vascular;  Laterality: Left;  . Amputation Left 01/28/2014    Procedure: AMPUTATION DIGIT- LEFT 5TH TOE;  Surgeon: [name], MD;  Location: [facility] OR;  Service: Vascular;  Laterality: Left;  . Amputation Left 02/02/2014    Procedure: Left 4th Ray Amputation  vs Transmetatarsal Amputation;  Surgeon: [name], MD;  Location: [facility] OR;  Service: Orthopedics;  Laterality: Left;  . Abdominal aortagram N/A 04/07/2013    Procedure: ABDOMINAL [name];  Surgeon: [name], MD;  Location: [facility] CATH LAB;  Service: Cardiovascular;  Laterality: N/A;  . Lower extremity angiogram Left 01/26/2014    Procedure: LOWER EXTREMITY ANGIOGRAM;  Surgeon: [name], MD;  Location: [facility] CATH LAB;  Service: Cardiovascular;  Laterality: Left;  . Abdominal aortagram N/A 01/26/2014    Procedure: ABDOMINAL AORTAGRAM;  Surgeon: [name], MD;  Location: [facility] CATH LAB;  Service: Cardiovascular;  Laterality: N/A;  . Amputation Left 03/24/2014    Procedure: AMPUTATION  BELOW KNEE;  Surgeon: [name], MD;  Location: [facility] OR;  Service: Orthopedics;  Laterality: Left;  . Tee without cardioversion N/A 03/28/2014    Procedure: TRANSESOPHAGEAL ECHOCARDIOGRAM (TEE);  Surgeon: Quintella Reichert, MD;  Location: [facility] ENDOSCOPY;  Service: Cardiovascular;  Laterality: N/A;    Allergies  Allergen Reactions  . Cilostazol Other (See Comments)    Feels like feet on fire    Current Outpatient Prescriptions  Medication Sig Dispense Refill  . acetaminophen (TYLENOL) 325 MG tablet Take 2 tablets (650 mg total) by mouth every 6 (six) hours as needed for mild pain (or Fever >/= 101).    [name] albuterol (PROVENTIL) (2.5 MG/3ML) 0.083% nebulizer solution Take 3 mLs (2.5 mg total) by nebulization every 6 (six) hours as needed for wheezing. 75 mL 12  . Amino Acids-Protein Hydrolys (FEEDING SUPPLEMENT, PRO-STAT SUGAR FREE 64,) LIQD Take 30 mLs by mouth 2 (two) times daily between meals. 900 mL 0  . amLODipine (NORVASC) 10 MG tablet Take 10 mg by mouth daily.    [name] aspirin 81 MG tablet Take 81 mg by mouth daily.    [name] aspirin EC 325 MG tablet Take 1 tablet (325 mg total) by mouth daily. 30 tablet 0  . docusate sodium 100 MG CAPS Take 100 mg by mouth 2 (two) times daily. 10 capsule 0  . escitalopram (LEXAPRO) 10 MG tablet Take 10 mg by mouth at bedtime.     . feeding supplement, GLUCERNA SHAKE, (GLUCERNA SHAKE) LIQD Take 237 mLs by mouth 2 (two) times daily between meals.  0  . gabapentin (NEURONTIN) 300 MG capsule Take 300 mg by mouth as needed.    . insulin aspart (NOVOLOG) 100 UNIT/ML injection Inject into the skin. Sliding Scale    . insulin glargine (LANTUS) 100 UNIT/ML injection Inject 0.2 mLs (20 Units total) into the skin 2 (two) times daily. 10 mL 11  . isosorbide mononitrate (IMDUR) 30 MG 24 hr tablet Take 30 mg by mouth daily.    [name] losartan (COZAAR) 100 MG tablet Take 100 mg by mouth daily.    . metFORMIN (GLUCOPHAGE) 1000 MG tablet Take 1,000 mg by mouth 2 (two) times daily  with a meal.    . metoprolol (TOPROL-XL) 100 MG 24 hr tablet Take 100 mg by mouth 2 (two) times daily.      . nitroGLYCERIN (NITROSTAT) 0.4 MG SL tablet Place 0.4 mg under the tongue as needed for chest pain.     . pantoprazole (PROTONIX) 40 MG tablet Take 1 tablet (40 mg total) by mouth daily.    . polyethylene glycol (MIRALAX / GLYCOLAX) packet Take 17 g by mouth daily as needed for mild constipation. 14 each 0  . rosuvastatin (CRESTOR) 40 MG tablet Take 40 mg by mouth every evening.    [name]  silver sulfADIAZINE (SILVADENE) 1 % cream Apply 1 application topically daily.  3  . Insulin Detemir (LEVEMIR [place]) Inject 25 Units into the skin 2 (two) times daily.    . nitroGLYCERIN (NITRODUR - DOSED IN MG/24 HR) 0.2 mg/hr patch   3  . ondansetron (ZOFRAN) 4 MG tablet Take 1 tablet (4 mg total) by mouth every 6 (six) hours as needed for nausea. (Patient not taking: Reported on 06/02/2014) 20 tablet 0  . oxyCODONE (OXY IR/ROXICODONE) 5 MG immediate release tablet Take 1 tablet (5 mg total) by mouth every 4 (four) hours as needed for severe pain. (Patient not taking: Reported on 05/19/2014) 30 tablet 0  . OxyCODONE (OXYCONTIN) 20 mg T12A 12 hr tablet Take 20 mg by mouth as needed.     No current facility-administered medications for this visit.    ROS: See HPI for pertinent positives and negatives.   Physical Examination  Filed Vitals:   06/02/14 1557  BP: 122/60  Pulse: 57  Resp: 16  Height: 5\' 9"  (1.753 m)  Weight: 194 lb (87.998 kg)  SpO2: 98%   Body mass index is 28.64 kg/(m^2).  General: A&O x 3, WDWN. Gait: in wheelchair Eyes: pupils equal. Pulmonary: CTAB, without wheezes , rales or rhonchi. Cardiac: regular Rythm , without detected murmur.     Carotid Bruits Right Left   Negative Negative  Aorta is not palpable.    VASCULAR EXAM: Extremities stump shrinker on left BKA stump. Right LE: Right heel with scales as at last visit. No gangrene. 2 dark  shallow small fissures at heel, scant serosanguinous drainage from right heel base of a scale.       LE Pulses Right Left   FEMORAL not palpablept sitting in w/c not palpable pt sitting in w/c    POPLITEAL not palpable  not palpable   POSTERIOR TIBIAL non-Dopplerable  BKA    DORSALIS PEDIS  ANTERIOR TIBIAL Monophasic by Doppler   BKA    PERONEAL biphasic by Doppler   BKA   Abdomen: soft, NT, no palpable masses. Skin: no rashes, see extremities. Musculoskeletal: no muscle wasting or atrophy. Left BKA. Neurologic: A&O X 3; Appropriate Affect, MOTOR FUNCTION: moving all extremities equally, motor strength 4/5 throughout. Speech is fluent/normal.  CN 2-12 is grossly intact.           Non-Invasive Vascular Imaging: DATE: 06/02/2014 ABI: RIGHT: 0.78 (03/21/14 MCH 0.97), Waveforms: biphasic;  LEFT: BKA   ASSESSMENT: Jordan [name] is a 74 y.o. male who  Is s/p left BKA in December 2015. He returns today with for 1 mo check for circulation of Right leg and C/O Right lateral foot/heel 2 fissures, and drainage, 2 days. Home health sees him 3 days/week, he is receiving physical therapy. He saw Dr. Lajoyce Cornersuda this morning and the 2 dark shallow fissures were not present at that time per pt. Dr. Lajoyce Cornersuda is managing his left BKA stump. Right ABI today indicates moderate arterial occlusive disease, biphasic waveforms.  He does not use Silvadene on his right foot, unknown etiology of dark coloring of 2 shallow small fissures on right heel, not gangrenous appearing.   PLAN:  I discussed in depth with the patient the nature of atherosclerosis, and emphasized the importance of maximal medical management including strict control of blood pressure, blood glucose, and lipid levels, obtaining regular exercise,  and continued cessation of smoking.  The patient is aware that without maximal medical management the underlying atherosclerotic disease process will progress, limiting the benefit of  any interventions.  Based on the patient's HPI, vascular studies and physical examination, and after discussing with Dr. Darrick Penna,  pt will return to clinic on June 08, 2014, to discuss with Dr. Edilia Bo whether an arteriogram is necessary; pt does have some degree of CKD; last creatine/GFR on file is from 03/28/15: 1.24/65.  The patient was given information about PAD including signs, symptoms, treatment, what symptoms should prompt the patient to seek immediate medical care, and risk reduction measures to take.  Charisse March, RN, MSN, FNP-C [facility] [place] Phone: [phone]  Clinic MD: Darrick Penna  06/02/2014 4:08 PM

## 2014-06-07 ENCOUNTER — Encounter: Payer: Self-pay | Admitting: Vascular Surgery

## 2014-06-08 ENCOUNTER — Encounter: Payer: Self-pay | Admitting: Vascular Surgery

## 2014-06-08 ENCOUNTER — Ambulatory Visit (INDEPENDENT_AMBULATORY_CARE_PROVIDER_SITE_OTHER): Payer: Medicare PPO | Admitting: Vascular Surgery

## 2014-06-08 ENCOUNTER — Other Ambulatory Visit: Payer: Self-pay

## 2014-06-08 VITALS — BP 142/54 | HR 60 | Ht 69.0 in | Wt 194.0 lb

## 2014-06-08 DIAGNOSIS — I70299 Other atherosclerosis of native arteries of extremities, unspecified extremity: Secondary | ICD-10-CM

## 2014-06-08 DIAGNOSIS — L97909 Non-pressure chronic ulcer of unspecified part of unspecified lower leg with unspecified severity: Secondary | ICD-10-CM

## 2014-06-08 NOTE — Progress Notes (Signed)
[facility]ist of [place]  Patient name: Jordan [name] MRN: [id number] DOB: 04-10-1940 Sex: male  REASON FOR VISIT: Nonhealing wound of left foot  HPI: Jordan Cheekshomas Rodak is a 74 y.o. male who was seen in our office by Charisse MarchSuzanne Nickel on 06/02/2014. He had a wound on the right heel. Arterial Doppler study showed moderate arterial occlusive disease on the right and he was set up for an appointment with me to discuss arteriography. He does not remember any specific injury to the right heel. The wound has been present for approximately a month. There is some blistering on the heel eschar. There has been no significant drainage. He denies any fever. He has undergone previous left below-the-knee amputation and has some wounds which are being managed by Dr. Lajoyce Cornersuda.   He is not a smoker. He does have diabetes. He is on metformin.  I do not get any history of claudication although currently his activity is very limited and he has not yet obtained his prosthesis. As no history of rest pain.   Past Medical History  Diagnosis Date  . CAD (coronary artery disease)     a. s/p IMI in past tx with POBA and cath 6 mos later with occluded RCA;  b. h/o Taxus DES to LAD and CFX;  c.  cath 1/10: LM ok, mLAD 40-50%, LAD stent ok, D2 tandem 60-70%, pCFX 30%, mAVCFX stent ok, pRCA 40%, RV 90%, RV marginal 90%, dRCA filled L-R collats tx medically  . Carotid stenosis     dopplers 5/11: 40-59% bilat  . DM2 (diabetes mellitus, type 2)   . HTN (hypertension)   . HLD (hyperlipidemia)   . Diabetes mellitus   . Staphylococcus aureus bacteremia with sepsis   . Osteomyelitis of left foot   . Diabetic ulcer of left foot   . Anemia   . Heart murmur   . CHF (congestive heart failure)   . Myocardial infarction 1995 and 2005    Heart Attack   Family History  Problem Relation Age of Onset  . Diabetes Mother   . Cancer Mother     Pancreatic  . Heart disease Mother     After age 74  . Hypertension Mother     . Diabetes Sister   . Cancer Sister     Bone   SOCIAL HISTORY: History  Substance Use Topics  . Smoking status: Former Smoker    Types: Pipe    Quit date: 02/18/2011  . Smokeless tobacco: Never Used  . Alcohol Use: No   Allergies  Allergen Reactions  . Cilostazol Other (See Comments)    Feels like feet on fire   Current Outpatient Prescriptions  Medication Sig Dispense Refill  . acetaminophen (TYLENOL) 325 MG tablet Take 2 tablets (650 mg total) by mouth every 6 (six) hours as needed for mild pain (or Fever >/= 101).    [name]. albuterol (PROVENTIL) (2.5 MG/3ML) 0.083% nebulizer solution Take 3 mLs (2.5 mg total) by nebulization every 6 (six) hours as needed for wheezing. 75 mL 12  . Amino Acids-Protein Hydrolys (FEEDING SUPPLEMENT, PRO-STAT SUGAR FREE 64,) LIQD Take 30 mLs by mouth 2 (two) times daily between meals. 900 mL 0  . amLODipine (NORVASC) 10 MG tablet Take 10 mg by mouth daily.    [name]. aspirin 81 MG tablet Take 81 mg by mouth daily.    [name]. aspirin EC 325 MG tablet Take 1 tablet (325 mg total) by mouth daily. 30 tablet 0  .  docusate sodium 100 MG CAPS Take 100 mg by mouth 2 (two) times daily. 10 capsule 0  . escitalopram (LEXAPRO) 10 MG tablet Take 10 mg by mouth at bedtime.     . feeding supplement, GLUCERNA SHAKE, (GLUCERNA SHAKE) LIQD Take 237 mLs by mouth 2 (two) times daily between meals.  0  . gabapentin (NEURONTIN) 300 MG capsule Take 300 mg by mouth as needed.    . insulin aspart (NOVOLOG) 100 UNIT/ML injection Inject into the skin. Sliding Scale    . Insulin Detemir (LEVEMIR [place]) Inject 25 Units into the skin 2 (two) times daily.    . insulin glargine (LANTUS) 100 UNIT/ML injection Inject 0.2 mLs (20 Units total) into the skin 2 (two) times daily. 10 mL 11  . isosorbide mononitrate (IMDUR) 30 MG 24 hr tablet Take 30 mg by mouth daily.    [name] losartan (COZAAR) 100 MG tablet Take 100 mg by mouth daily.    . metFORMIN (GLUCOPHAGE) 1000 MG tablet Take 1,000 mg by mouth 2 (two)  times daily with a meal.    . metoprolol (TOPROL-XL) 100 MG 24 hr tablet Take 100 mg by mouth 2 (two) times daily.      . nitroGLYCERIN (NITRODUR - DOSED IN MG/24 HR) 0.2 mg/hr patch   3  . nitroGLYCERIN (NITROSTAT) 0.4 MG SL tablet Place 0.4 mg under the tongue as needed for chest pain.     [name] ondansetron (ZOFRAN) 4 MG tablet Take 1 tablet (4 mg total) by mouth every 6 (six) hours as needed for nausea. 20 tablet 0  . oxyCODONE (OXY IR/ROXICODONE) 5 MG immediate release tablet Take 1 tablet (5 mg total) by mouth every 4 (four) hours as needed for severe pain. 30 tablet 0  . OxyCODONE (OXYCONTIN) 20 mg T12A 12 hr tablet Take 20 mg by mouth as needed.    . pantoprazole (PROTONIX) 40 MG tablet Take 1 tablet (40 mg total) by mouth daily.    . polyethylene glycol (MIRALAX / GLYCOLAX) packet Take 17 g by mouth daily as needed for mild constipation. 14 each 0  . rosuvastatin (CRESTOR) 40 MG tablet Take 40 mg by mouth every evening.    . silver sulfADIAZINE (SILVADENE) 1 % cream Apply 1 application topically daily.  3   No current facility-administered medications for this visit.   REVIEW OF SYSTEMS: Arly.Keller ] denotes positive finding; [  ] denotes negative finding  CARDIOVASCULAR:   chest pain    chest pressure    palpitations    orthopnea    dyspnea on exertion    claudication    rest pain    DVT    phlebitis PULMONARY:    productive cough    asthma    wheezing NEUROLOGIC:    weakness   paresthesias   aphasia   amaurosis   dizziness HEMATOLOGIC:    bleeding problems    clotting disorders MUSCULOSKELETAL:   joint pain    joint swelling  leg swelling GASTROINTESTINAL:   blood in stool    hematemesis GENITOURINARY:    dysuria    hematuria PSYCHIATRIC:   history of major depression INTEGUMENTARY:   rashes  Arly.Keller ] ulcers CONSTITUTIONAL:   fever    chills  PHYSICAL EXAM: Filed Vitals:   06/08/14 0930  BP: 142/54    Pulse:  60  Height:  (1.753 m)  Weight: 194 lb (87.998 kg)  SpO2: 100%   Body mass index is 28.64 kg/(m^2). GENERAL: The patient is a well-nourished male, in no acute distress. The vital signs are documented above. CARDIOVASCULAR: There is a regular rate and rhythm. He has palpable femoral pulses. I cannot palpate a right dorsalis pedis or posterior tibial pulse. PULMONARY: There is good air exchange bilaterally without wheezing or rales. ABDOMEN: Soft and non-tender with normal pitched bowel sounds.  MUSCULOSKELETAL: He has a left below the knee amputation. There is a small wound on his left patella and also on the pretibial area on the left BKA. NEUROLOGIC: No focal weakness or paresthesias are detected. SKIN: There are no ulcers or rashes noted. PSYCHIATRIC: The patient has a normal affect.  DATA:  I have reviewed his previous arteriogram from August 2015. This shows no significant aortoiliac occlusive disease. The right leg was not studied because of dye considerations.  His creatinine was 1.24 on 03/27/2014.  MEDICAL ISSUES:  ATHEROSCLEROSIS WITH ULCERATION RIGHT LOWER EXTREMITY: given the wound on the right heel and evidence of infrainguinal arterial occlusive disease, I have recommended we proceed with arteriography to evaluate his options for revascularization. I have reviewed with the patient the indications for arteriography. In addition, I have reviewed the potential complications of arteriography including but not limited to: Bleeding, arterial injury, arterial thrombosis, dye action, renal insufficiency, or other unpredictable medical problems. I have explained to the patient that if we find disease amenable to angioplasty we could potentially address this at the same time. I have discussed the potential complications of angioplasty and stenting, including but not limited to: Bleeding, arterial thrombosis, arterial injury, dissection, or the need for surgical intervention. I  will make further recommendations pending these results.   Arthi Mcdonald S [facility] [place] Beeper: [phone]

## 2014-06-13 ENCOUNTER — Ambulatory Visit (HOSPITAL_COMMUNITY)
Admission: RE | Admit: 2014-06-13 | Discharge: 2014-06-13 | Disposition: A | Discharge status: Home / Self Care | Payer: Medicare PPO | Source: Ambulatory Visit | Attending: Vascular Surgery | Admitting: Vascular Surgery

## 2014-06-13 ENCOUNTER — Encounter (HOSPITAL_COMMUNITY): Payer: Self-pay | Admitting: Vascular Surgery

## 2014-06-13 ENCOUNTER — Telehealth: Payer: Self-pay | Admitting: Vascular Surgery

## 2014-06-13 ENCOUNTER — Encounter (HOSPITAL_COMMUNITY)
Admission: RE | Disposition: A | Discharge status: Home / Self Care | Payer: Self-pay | Source: Ambulatory Visit | Attending: Vascular Surgery

## 2014-06-13 DIAGNOSIS — I252 Old myocardial infarction: Secondary | ICD-10-CM | POA: Diagnosis not present

## 2014-06-13 DIAGNOSIS — I70234 Atherosclerosis of native arteries of right leg with ulceration of heel and midfoot: Secondary | ICD-10-CM | POA: Diagnosis not present

## 2014-06-13 DIAGNOSIS — L97419 Non-pressure chronic ulcer of right heel and midfoot with unspecified severity: Secondary | ICD-10-CM | POA: Diagnosis not present

## 2014-06-13 DIAGNOSIS — E785 Hyperlipidemia, unspecified: Secondary | ICD-10-CM | POA: Diagnosis not present

## 2014-06-13 DIAGNOSIS — I10 Essential (primary) hypertension: Secondary | ICD-10-CM | POA: Insufficient documentation

## 2014-06-13 DIAGNOSIS — Z7982 Long term (current) use of aspirin: Secondary | ICD-10-CM | POA: Diagnosis not present

## 2014-06-13 DIAGNOSIS — E119 Type 2 diabetes mellitus without complications: Secondary | ICD-10-CM | POA: Diagnosis not present

## 2014-06-13 DIAGNOSIS — Z79891 Long term (current) use of opiate analgesic: Secondary | ICD-10-CM | POA: Insufficient documentation

## 2014-06-13 DIAGNOSIS — Z79899 Other long term (current) drug therapy: Secondary | ICD-10-CM | POA: Insufficient documentation

## 2014-06-13 DIAGNOSIS — I509 Heart failure, unspecified: Secondary | ICD-10-CM | POA: Diagnosis not present

## 2014-06-13 DIAGNOSIS — Z87891 Personal history of nicotine dependence: Secondary | ICD-10-CM | POA: Diagnosis not present

## 2014-06-13 DIAGNOSIS — I748 Embolism and thrombosis of other arteries: Secondary | ICD-10-CM | POA: Insufficient documentation

## 2014-06-13 DIAGNOSIS — I7789 Other specified disorders of arteries and arterioles: Secondary | ICD-10-CM | POA: Insufficient documentation

## 2014-06-13 DIAGNOSIS — Z955 Presence of coronary angioplasty implant and graft: Secondary | ICD-10-CM | POA: Insufficient documentation

## 2014-06-13 DIAGNOSIS — Z794 Long term (current) use of insulin: Secondary | ICD-10-CM | POA: Insufficient documentation

## 2014-06-13 DIAGNOSIS — I2510 Atherosclerotic heart disease of native coronary artery without angina pectoris: Secondary | ICD-10-CM | POA: Insufficient documentation

## 2014-06-13 DIAGNOSIS — Z792 Long term (current) use of antibiotics: Secondary | ICD-10-CM | POA: Diagnosis not present

## 2014-06-13 DIAGNOSIS — I739 Peripheral vascular disease, unspecified: Secondary | ICD-10-CM | POA: Diagnosis present

## 2014-06-13 HISTORY — PX: ABDOMINAL AORTAGRAM: SHX5454

## 2014-06-13 LAB — POCT I-STAT, CHEM 8
BUN: 24 mg/dL — ABNORMAL HIGH (ref 6–23)
Calcium, Ion: 1.19 mmol/L (ref 1.13–1.30)
Chloride: 106 mmol/L (ref 96–112)
Creatinine, Ser: 1 mg/dL (ref 0.50–1.35)
Glucose, Bld: 124 mg/dL — ABNORMAL HIGH (ref 70–99)
HCT: 41 % (ref 39.0–52.0)
Hemoglobin: 13.9 g/dL (ref 13.0–17.0)
Potassium: 4.4 mmol/L (ref 3.5–5.1)
Sodium: 140 mmol/L (ref 135–145)
TCO2: 20 mmol/L (ref 0–100)

## 2014-06-13 LAB — GLUCOSE, CAPILLARY: Glucose-Capillary: 150 mg/dL — ABNORMAL HIGH (ref 70–99)

## 2014-06-13 SURGERY — ABDOMINAL AORTAGRAM
Anesthesia: LOCAL | Laterality: Right

## 2014-06-13 MED ORDER — SODIUM CHLORIDE 0.9 % IV SOLN: 1.0000 mL/kg/h | INTRAVENOUS | Status: DC

## 2014-06-13 MED ORDER — HEPARIN (PORCINE) IN NACL 2-0.9 UNIT/ML-% IJ SOLN
INTRAMUSCULAR | Status: AC
  Filled 2014-06-13: qty 1000

## 2014-06-13 MED ORDER — LIDOCAINE HCL (PF) 1 % IJ SOLN
INTRAMUSCULAR | Status: AC
  Filled 2014-06-13: qty 30

## 2014-06-13 MED ORDER — FENTANYL CITRATE 0.05 MG/ML IJ SOLN
INTRAMUSCULAR | Status: AC
  Filled 2014-06-13: qty 2

## 2014-06-13 MED ORDER — SODIUM CHLORIDE 0.9 % IV SOLN
INTRAVENOUS | Status: DC
  Administered 2014-06-13: 06:00:00 via INTRAVENOUS

## 2014-06-13 MED ORDER — NITROGLYCERIN 1 MG/10 ML FOR IR/CATH LAB
INTRA_ARTERIAL | Status: AC
  Filled 2014-06-13: qty 10

## 2014-06-13 MED ORDER — MIDAZOLAM HCL 2 MG/2ML IJ SOLN
INTRAMUSCULAR | Status: AC
  Filled 2014-06-13: qty 2

## 2014-06-13 NOTE — Op Note (Signed)
   PATIENT: Jordan [name]   MRN: [id number] DOB: [birth year]    DATE OF PROCEDURE: 06/13/2014  INDICATIONS: Jordan Cheekshomas Schauer is a 74 y.o. male who presented with a nonhealing wound of the right heel. He developed a blister and then subsequently a small eschar. This has been present for approximately a month. He has undergone previous left below-the-knee amputation by Dr. Lajoyce Cornersuda.  PROCEDURE:  1. Ultrasound-guided access to the left common femoral artery 2. Aortogram with bilateral iliac arteriogram 3. Second order catheterization of the right external iliac artery with right lower extremity runoff  SURGEON: Di Kindlehristopher S. Edilia Boickson, MD, FACS  ANESTHESIA: local with sedation   EBL: minimal  TECHNIQUE: The patient was taken to the peripheral vascular lab and received 1 mg of Versed and 50 g of fentanyl. Both groins were prepped and draped in the usual sterile fashion. Under ultrasound guidance, after the skin was anesthetized, the left common femoral artery was cannulated and a guidewire introduced into the infrarenal aorta under fluoroscopic control. The pigtail catheter was positioned at the L1 vertebral body. Flush aortogram was obtained. The catheter was then repositioned above the aortic bifurcation and oblique iliac projection was obtained. The pigtail catheter was then exchanged for a rim catheter which was positioned into the proximal right common iliac artery. The wire was advanced into the external iliac artery and then the rim catheter exchanged for a straight catheter. Selective right external iliac arteriogram was obtained with right lower extremity runoff  FINDINGS:  1. Single renal arteries bilaterally with no significant renal artery stenosis identified. 2. The infrarenal aorta is widely patent. Both common iliac arteries are widely patent. Both external iliac arteries are widely patent. Both internal iliac arteries are widely patent. There is some mild disease right at the bifurcation of the  distal right common iliac artery but no significant stenosis. 3. The right common femoral artery and deep femoral artery are patent. There is mild diffuse disease throughout the right superficial femoral artery. The anterior tibial and posterior tibial arteries are occluded. There is single-vessel runoff on the right via the peroneal artery. There is a focal stenosis in the tibioperoneal trunk proximally some mild disease in the peroneal distally. There are significant collaterals in this area.  CLINICAL NOTE: Has a wound on the right heel appears to be improving I did not elect to proceed with angioplasty of the stenosis of the tibial peroneal trunk given the small risk of making the circulation worse if this was not successful. We will continue with conservative treatment as long as the wound is improving. If it did turn in the wrong direction he could be considered for PTA of the tibial peroneal trunk or vein patch angioplasty of this area.  Waverly Ferrarihristopher Gianmarco Roye, MD, FACS [facility] [place]  DATE OF DICTATION:   06/13/2014

## 2014-06-13 NOTE — Discharge Instructions (Signed)

## 2014-06-13 NOTE — Interval H&P Note (Signed)
History and Physical Interval Note:  06/13/2014 8:33 AM  Jordan [name]  has presented today for surgery, with the diagnosis of pvd with right heel ulcer  The various methods of treatment have been discussed with the patient and family. After consideration of risks, benefits and other options for treatment, the patient has consented to  Procedure(s): ABDOMINAL AORTAGRAM (N/A) ANGIOGRAM EXTREMITY RIGHT (Right) as a surgical intervention .  The patient's history has been reviewed, patient examined, no change in status, stable for surgery.  I have reviewed the patient's chart and labs.  Questions were answered to the patient's satisfaction.     Aland Chestnutt S

## 2014-06-13 NOTE — Interval H&P Note (Signed)
History and Physical Interval Note:  06/13/2014 7:28 AM  Jordan [name]  has presented today for surgery, with the diagnosis of pvd with right heel ulcer  The various methods of treatment have been discussed with the patient and family. After consideration of risks, benefits and other options for treatment, the patient has consented to  Procedure(s): ABDOMINAL AORTAGRAM (N/A) as a surgical intervention .  The patient's history has been reviewed, patient examined, no change in status, stable for surgery.  I have reviewed the patient's chart and labs.  Questions were answered to the patient's satisfaction.     DICKSON,CHRISTOPHER S

## 2014-06-13 NOTE — Telephone Encounter (Addendum)
-----   Message from Phillips Odorarol S Pullins, RN sent at 06/13/2014  1:55 PM EDT ----- Regarding: Tiburcio BashKay log; and needs 3-4 wk f/u with [facility]   ----- Message -----    From: Chuck Hinthristopher S Dickson, MD    Sent: 06/13/2014   8:18 AM      To: [facility] Charge Pool Subject: charge and f/u                                 PROCEDURE:  1. Ultrasound-guided access to the left common femoral artery 2. Aortogram with bilateral iliac arteriogram 3. Second order catheterization of the right external iliac artery with right lower extremity runoff  SURGEON: Di Kindlehristopher S. Edilia Boickson, MD, FACS  He needs a follow up visit in 3-4 weeks to check on his right heel. Thank you. CD  06/13/14: lm for pt re appt, dpm

## 2014-06-13 NOTE — H&P (View-Only) (Signed)
 [facility] [place]  Patient name: Jordan [name] MRN: [id number] DOB: 11/19/1940 Sex: male  REASON FOR VISIT: Nonhealing wound of left foot  HPI: Jordan [name] is a 73 y.o. male who was seen in our office by Suzanne Nickel on 06/02/2014. He had a wound on the right heel. Arterial Doppler study showed moderate arterial occlusive disease on the right and he was set up for an appointment with me to discuss arteriography. He does not remember any specific injury to the right heel. The wound has been present for approximately a month. There is some blistering on the heel eschar. There has been no significant drainage. He denies any fever. He has undergone previous left below-the-knee amputation and has some wounds which are being managed by Dr. Duda.   He is not a smoker. He does have diabetes. He is on metformin.  I do not get any history of claudication although currently his activity is very limited and he has not yet obtained his prosthesis. As no history of rest pain.   Past Medical History  Diagnosis Date  . CAD (coronary artery disease)     a. s/p IMI in past tx with POBA and cath 6 mos later with occluded RCA;  b. h/o Taxus DES to LAD and CFX;  c.  cath 1/10: LM ok, mLAD 40-50%, LAD stent ok, D2 tandem 60-70%, pCFX 30%, mAVCFX stent ok, pRCA 40%, RV 90%, RV marginal 90%, dRCA filled L-R collats tx medically  . Carotid stenosis     dopplers 5/11: 40-59% bilat  . DM2 (diabetes mellitus, type 2)   . HTN (hypertension)   . HLD (hyperlipidemia)   . Diabetes mellitus   . Staphylococcus aureus bacteremia with sepsis   . Osteomyelitis of left foot   . Diabetic ulcer of left foot   . Anemia   . Heart murmur   . CHF (congestive heart failure)   . Myocardial infarction 1995 and 2005    Heart Attack   Family History  Problem Relation Age of Onset  . Diabetes Mother   . Cancer Mother     Pancreatic  . Heart disease Mother     After age 60  . Hypertension Mother     . Diabetes Sister   . Cancer Sister     Bone   SOCIAL HISTORY: History  Substance Use Topics  . Smoking status: Former Smoker    Types: Pipe    Quit date: 02/18/2011  . Smokeless tobacco: Never Used  . Alcohol Use: No   Allergies  Allergen Reactions  . Cilostazol Other (See Comments)    Feels like feet on fire   Current Outpatient Prescriptions  Medication Sig Dispense Refill  . acetaminophen (TYLENOL) 325 MG tablet Take 2 tablets (650 mg total) by mouth every 6 (six) hours as needed for mild pain (or Fever >/= 101).    . albuterol (PROVENTIL) (2.5 MG/3ML) 0.083% nebulizer solution Take 3 mLs (2.5 mg total) by nebulization every 6 (six) hours as needed for wheezing. 75 mL 12  . Amino Acids-Protein Hydrolys (FEEDING SUPPLEMENT, PRO-STAT SUGAR FREE 64,) LIQD Take 30 mLs by mouth 2 (two) times daily between meals. 900 mL 0  . amLODipine (NORVASC) 10 MG tablet Take 10 mg by mouth daily.    . aspirin 81 MG tablet Take 81 mg by mouth daily.    . aspirin EC 325 MG tablet Take 1 tablet (325 mg total) by mouth daily. 30 tablet 0  .   docusate sodium 100 MG CAPS Take 100 mg by mouth 2 (two) times daily. 10 capsule 0  . escitalopram (LEXAPRO) 10 MG tablet Take 10 mg by mouth at bedtime.     . feeding supplement, GLUCERNA SHAKE, (GLUCERNA SHAKE) LIQD Take 237 mLs by mouth 2 (two) times daily between meals.  0  . gabapentin (NEURONTIN) 300 MG capsule Take 300 mg by mouth as needed.    . insulin aspart (NOVOLOG) 100 UNIT/ML injection Inject into the skin. Sliding Scale    . Insulin Detemir (LEVEMIR [place]) Inject 25 Units into the skin 2 (two) times daily.    . insulin glargine (LANTUS) 100 UNIT/ML injection Inject 0.2 mLs (20 Units total) into the skin 2 (two) times daily. 10 mL 11  . isosorbide mononitrate (IMDUR) 30 MG 24 hr tablet Take 30 mg by mouth daily.    . losartan (COZAAR) 100 MG tablet Take 100 mg by mouth daily.    . metFORMIN (GLUCOPHAGE) 1000 MG tablet Take 1,000 mg by mouth 2 (two)  times daily with a meal.    . metoprolol (TOPROL-XL) 100 MG 24 hr tablet Take 100 mg by mouth 2 (two) times daily.      . nitroGLYCERIN (NITRODUR - DOSED IN MG/24 HR) 0.2 mg/hr patch   3  . nitroGLYCERIN (NITROSTAT) 0.4 MG SL tablet Place 0.4 mg under the tongue as needed for chest pain.     . ondansetron (ZOFRAN) 4 MG tablet Take 1 tablet (4 mg total) by mouth every 6 (six) hours as needed for nausea. 20 tablet 0  . oxyCODONE (OXY IR/ROXICODONE) 5 MG immediate release tablet Take 1 tablet (5 mg total) by mouth every 4 (four) hours as needed for severe pain. 30 tablet 0  . OxyCODONE (OXYCONTIN) 20 mg T12A 12 hr tablet Take 20 mg by mouth as needed.    . pantoprazole (PROTONIX) 40 MG tablet Take 1 tablet (40 mg total) by mouth daily.    . polyethylene glycol (MIRALAX / GLYCOLAX) packet Take 17 g by mouth daily as needed for mild constipation. 14 each 0  . rosuvastatin (CRESTOR) 40 MG tablet Take 40 mg by mouth every evening.    . silver sulfADIAZINE (SILVADENE) 1 % cream Apply 1 application topically daily.  3   No current facility-administered medications for this visit.   REVIEW OF SYSTEMS: [X ] denotes positive finding; [  ] denotes negative finding  CARDIOVASCULAR:  [ ] chest pain   [ ] chest pressure   [ ] palpitations   [ ] orthopnea   [ ] dyspnea on exertion   [ ] claudication   [ ] rest pain   [ ] DVT   [ ] phlebitis PULMONARY:   [ ] productive cough   [ ] asthma   [ ] wheezing NEUROLOGIC:   [ ] weakness  [ ] paresthesias  [ ] aphasia  [ ] amaurosis  [ ] dizziness HEMATOLOGIC:   [ ] bleeding problems   [ ] clotting disorders MUSCULOSKELETAL:  [ ] joint pain   [ ] joint swelling [ ] leg swelling GASTROINTESTINAL: [ ]  blood in stool  [ ]  hematemesis GENITOURINARY:  [ ]  dysuria  [ ]  hematuria PSYCHIATRIC:  [ ] history of major depression INTEGUMENTARY:  [ ] rashes  [X ] ulcers CONSTITUTIONAL:  [ ] fever   [ ] chills  PHYSICAL EXAM: Filed Vitals:   06/08/14 0930  BP: 142/54    Pulse:   60  Height: 5' 9" (1.753 m)  Weight: 194 lb (87.998 kg)  SpO2: 100%   Body mass index is 28.64 kg/(m^2). GENERAL: The patient is a well-nourished male, in no acute distress. The vital signs are documented above. CARDIOVASCULAR: There is a regular rate and rhythm. He has palpable femoral pulses. I cannot palpate a right dorsalis pedis or posterior tibial pulse. PULMONARY: There is good air exchange bilaterally without wheezing or rales. ABDOMEN: Soft and non-tender with normal pitched bowel sounds.  MUSCULOSKELETAL: He has a left below the knee amputation. There is a small wound on his left patella and also on the pretibial area on the left BKA. NEUROLOGIC: No focal weakness or paresthesias are detected. SKIN: There are no ulcers or rashes noted. PSYCHIATRIC: The patient has a normal affect.  DATA:  I have reviewed his previous arteriogram from August 2015. This shows no significant aortoiliac occlusive disease. The right leg was not studied because of dye considerations.  His creatinine was 1.24 on 03/27/2014.  MEDICAL ISSUES:  ATHEROSCLEROSIS WITH ULCERATION RIGHT LOWER EXTREMITY: given the wound on the right heel and evidence of infrainguinal arterial occlusive disease, I have recommended we proceed with arteriography to evaluate his options for revascularization. I have reviewed with the patient the indications for arteriography. In addition, I have reviewed the potential complications of arteriography including but not limited to: Bleeding, arterial injury, arterial thrombosis, dye action, renal insufficiency, or other unpredictable medical problems. I have explained to the patient that if we find disease amenable to angioplasty we could potentially address this at the same time. I have discussed the potential complications of angioplasty and stenting, including but not limited to: Bleeding, arterial thrombosis, arterial injury, dissection, or the need for surgical intervention. I  will make further recommendations pending these results.   Charlesia Canaday S [facility] [place] Beeper: [phone]    

## 2014-06-22 ENCOUNTER — Ambulatory Visit: Payer: Medicare HMO | Admitting: Infectious Disease

## 2014-06-23 ENCOUNTER — Encounter
Admit: 2014-06-23 | Disposition: A | Discharge status: Home / Self Care | Payer: Self-pay | Attending: Surgery | Admitting: Surgery

## 2014-06-29 ENCOUNTER — Encounter: Payer: Self-pay | Admitting: Vascular Surgery

## 2014-07-01 ENCOUNTER — Encounter
Admit: 2014-07-01 | Disposition: A | Discharge status: Home / Self Care | Payer: Self-pay | Attending: Surgery | Admitting: Surgery

## 2014-07-05 ENCOUNTER — Encounter: Payer: Self-pay | Admitting: Vascular Surgery

## 2014-07-06 ENCOUNTER — Ambulatory Visit (INDEPENDENT_AMBULATORY_CARE_PROVIDER_SITE_OTHER): Payer: Medicare PPO | Admitting: Vascular Surgery

## 2014-07-06 ENCOUNTER — Encounter: Payer: Self-pay | Admitting: Vascular Surgery

## 2014-07-06 VITALS — BP 132/51 | HR 53 | Ht 69.0 in | Wt 200.0 lb

## 2014-07-06 DIAGNOSIS — I70299 Other atherosclerosis of native arteries of extremities, unspecified extremity: Secondary | ICD-10-CM | POA: Diagnosis not present

## 2014-07-06 DIAGNOSIS — L97909 Non-pressure chronic ulcer of unspecified part of unspecified lower leg with unspecified severity: Secondary | ICD-10-CM

## 2014-07-06 NOTE — Progress Notes (Signed)
Patient name: Jordan Cheekshomas Wrobel MRN: [id number] DOB: May 25, 1940 Sex: male  REASON FOR VISIT: follow up of right foot wound.  HPI: Jordan [name] is a 74 y.o. male who presented with a nonhealing wound of his right heel. He developed a blister and then subsequently a small eschar. He has undergone previous left below the knee amputation by Dr. Lajoyce Cornersuda.   He underwent an arteriogram on 06/13/2014. This showed that on the right side, there was some mild disease in the distal right common iliac artery but no significant stenosis. The right common femoral and deep femoral arteries were patent. There was mild diffuse disease throughout the right superficial femoral artery. The anterior tibial and posterior tibial arteries were occluded. There was single-vessel runoff via the peroneal artery with a focal stenosis in the tibial peroneal trunk proximally and some mild disease in the peroneal artery distally.  Today he has no specific complaints and Dr. Lajoyce Cornersuda has been managing his right heel wound. He denies significant rest pain. He denies fever or chills.  Current Outpatient Prescriptions  Medication Sig Dispense Refill  . acetaminophen (TYLENOL) 325 MG tablet Take 2 tablets (650 mg total) by mouth every 6 (six) hours as needed for mild pain (or Fever >/= 101).    [name]. albuterol (PROVENTIL) (2.5 MG/3ML) 0.083% nebulizer solution Take 3 mLs (2.5 mg total) by nebulization every 6 (six) hours as needed for wheezing. 75 mL 12  . Amino Acids-Protein Hydrolys (FEEDING SUPPLEMENT, PRO-STAT SUGAR FREE 64,) LIQD Take 30 mLs by mouth 2 (two) times daily between meals. 900 mL 0  . amLODipine (NORVASC) 10 MG tablet Take 10 mg by mouth daily.    [name]. aspirin 81 MG tablet Take 81 mg by mouth daily.    . Cinnamon 500 MG TABS Take 1 tablet by mouth daily.    [name]. docusate sodium 100 MG CAPS Take 100 mg by mouth 2 (two) times daily. 10 capsule 0  . escitalopram (LEXAPRO) 10 MG tablet Take 10 mg by mouth at bedtime.     . feeding  supplement, GLUCERNA SHAKE, (GLUCERNA SHAKE) LIQD Take 237 mLs by mouth 2 (two) times daily between meals.  0  . gabapentin (NEURONTIN) 300 MG capsule Take 300 mg by mouth 2 (two) times daily.     . insulin aspart (NOVOLOG) 100 UNIT/ML injection Inject 2-15 Units into the skin 3 (three) times daily with meals. Sliding Scale    . insulin glargine (LANTUS) 100 UNIT/ML injection Inject 0.2 mLs (20 Units total) into the skin 2 (two) times daily. (Patient taking differently: Inject 27 Units into the skin 2 (two) times daily. ) 10 mL 11  . isosorbide mononitrate (IMDUR) 30 MG 24 hr tablet Take 30 mg by mouth daily.    [name]. losartan (COZAAR) 100 MG tablet Take 100 mg by mouth daily.    . metFORMIN (GLUCOPHAGE) 1000 MG tablet Take 1,000 mg by mouth 2 (two) times daily with a meal.    . metoprolol (TOPROL-XL) 100 MG 24 hr tablet Take 100 mg by mouth 2 (two) times daily.      . nitroGLYCERIN (NITRODUR - DOSED IN MG/24 HR) 0.2 mg/hr patch   3  . nitroGLYCERIN (NITROSTAT) 0.4 MG SL tablet Place 0.4 mg under the tongue as needed for chest pain.     . pantoprazole (PROTONIX) 40 MG tablet Take 1 tablet (40 mg total) by mouth daily.    . polyethylene glycol (MIRALAX / GLYCOLAX) packet Take 17 g by mouth daily as needed  for mild constipation. 14 each 0  . rosuvastatin (CRESTOR) 40 MG tablet Take 40 mg by mouth every evening.    . silver sulfADIAZINE (SILVADENE) 1 % cream Apply 1 application topically daily.  3  . aspirin EC 325 MG tablet Take 1 tablet (325 mg total) by mouth daily. (Patient not taking: Reported on 07/06/2014) 30 tablet 0  . Insulin Detemir (LEVEMIR [place]) Inject 25 Units into the skin 2 (two) times daily.    . ondansetron (ZOFRAN) 4 MG tablet Take 1 tablet (4 mg total) by mouth every 6 (six) hours as needed for nausea. (Patient not taking: Reported on 07/06/2014) 20 tablet 0  . oxyCODONE (OXY IR/ROXICODONE) 5 MG immediate release tablet Take 1 tablet (5 mg total) by mouth every 4 (four) hours as needed for  severe pain. (Patient not taking: Reported on 07/06/2014) 30 tablet 0  . OxyCODONE (OXYCONTIN) 20 mg T12A 12 hr tablet Take 20 mg by mouth as needed.     No current facility-administered medications for this visit.   REVIEW OF SYSTEMS: Arly.Keller ] denotes positive finding; [  ] denotes negative finding  CARDIOVASCULAR:   chest pain    dyspnea on exertion    CONSTITUTIONAL:   fever    chills  PHYSICAL EXAM: Filed Vitals:   07/06/14 0900 07/06/14 0901  BP: 136/53 132/51  Pulse: 53   Height:  (1.753 m)   Weight: 200 lb (90.719 kg)   SpO2: 98%    GENERAL: The patient is a well-nourished male, in no acute distress. The vital signs are documented above. CARDIOVASCULAR: There is a regular rate and rhythm. PULMONARY: There is good air exchange bilaterally without wheezing or rales. The right heel wound has improved and currently there are no open ulcers. He has some dry skin. The foot is warm and adequately perfused.  MEDICAL ISSUES:  TIBIAL ARTERY OCCLUSIVE DISEASE: The patient's right heel appears to be gradually improving. We would only consider angioplasty of the tibial peroneal trunk or vein patch angioplasty if the wound turned in the wrong direction. This would be associated with risk given that if the angioplasty failed then he would be in a worse situation than when he started. I plan on seeing him back in 6 months and I have ordered follow up ABIs of the right lower extremity at that time. He knows to call sooner if he has problems.  Danyale Ridinger S [facility] [place] Beeper: [phone]

## 2014-07-06 NOTE — Addendum Note (Signed)
Addended by: Sharee PimpleMCCHESNEY, MARILYN K on: 07/06/2014 09:30 AM   Modules accepted: Orders

## 2014-09-26 ENCOUNTER — Other Ambulatory Visit: Payer: Self-pay

## 2015-01-10 ENCOUNTER — Encounter: Payer: Self-pay | Admitting: Vascular Surgery

## 2015-01-11 ENCOUNTER — Ambulatory Visit (INDEPENDENT_AMBULATORY_CARE_PROVIDER_SITE_OTHER): Payer: Medicare PPO | Admitting: Vascular Surgery

## 2015-01-11 ENCOUNTER — Ambulatory Visit (HOSPITAL_COMMUNITY)
Admission: RE | Admit: 2015-01-11 | Discharge: 2015-01-11 | Disposition: A | Discharge status: Home / Self Care | Payer: Medicare PPO | Source: Ambulatory Visit | Attending: Vascular Surgery | Admitting: Vascular Surgery

## 2015-01-11 ENCOUNTER — Encounter: Payer: Self-pay | Admitting: Vascular Surgery

## 2015-01-11 VITALS — BP 134/59 | HR 77 | Temp 97.8°F | Resp 14 | Ht 69.0 in | Wt 234.0 lb

## 2015-01-11 DIAGNOSIS — L97909 Non-pressure chronic ulcer of unspecified part of unspecified lower leg with unspecified severity: Secondary | ICD-10-CM

## 2015-01-11 DIAGNOSIS — I70299 Other atherosclerosis of native arteries of extremities, unspecified extremity: Secondary | ICD-10-CM | POA: Diagnosis not present

## 2015-01-11 DIAGNOSIS — Z89512 Acquired absence of left leg below knee: Secondary | ICD-10-CM | POA: Insufficient documentation

## 2015-01-11 DIAGNOSIS — I739 Peripheral vascular disease, unspecified: Secondary | ICD-10-CM | POA: Diagnosis not present

## 2015-01-11 NOTE — Progress Notes (Signed)
[facility]  Patient name: Jordan [name] MRN: [id number] DOB: [birth year] Sex: male  REASON FOR VISIT: Peripheral vascular disease with nonhealing ulcer.   HPI: Jordan [name] is a 74 y.o. male, who I last saw on 07/06/2014 with a wound on the right foot. He is undergone previous left below the knee amputation by Dr. Lajoyce Cornersuda. He underwent an arteriogram  On 06/13/2014. This showed that on the right side, there was some mild disease in the distal right common iliac artery but no significant stenosis. The right common femoral and deep femoral arteries were patent. There was mild diffuse disease throughout the right superficial femoral artery. The anterior tibial and posterior tibial arteries were occluded. There was single-vessel runoff via the peroneal artery with a focal stenosis in the tibial peroneal trunk proximally and some mild disease in the peroneal artery distally. At the time of his last visit the right heel wound appeared to be gradually improving. I felt that we would only consider angioplasty of the tibial peroneal trunk or vein patch angioplasty if the wound turned in the wrong direction. I will see him back in 6 months.  Comes in for continued follow up. At this point he does not have any significant ulcers on the right foot but has very dry skin and also some fungal infection on the dorsum of the foot. He denies significant rest pain. He is ambulatory with his prosthesis. He is not a smoker.  Past Medical History  Diagnosis Date  . CAD (coronary artery disease)     a. s/p IMI in past tx with POBA and cath 6 mos later with occluded RCA;  b. h/o Taxus DES to LAD and CFX;  c.  cath 1/10: LM ok, mLAD 40-50%, LAD stent ok, D2 tandem 60-70%, pCFX 30%, mAVCFX stent ok, pRCA 40%, RV 90%, RV marginal 90%, dRCA filled L-R collats tx medically  . Carotid stenosis     dopplers 5/11: 40-59% bilat  . DM2 (diabetes mellitus, type 2) (HCC)   . HTN (hypertension)   . HLD  (hyperlipidemia)   . Diabetes mellitus   . Staphylococcus aureus bacteremia with sepsis (HCC)   . Osteomyelitis of left foot (HCC)   . Diabetic ulcer of left foot (HCC)   . Anemia   . Heart murmur   . CHF (congestive heart failure) (HCC)   . Myocardial infarction [facility](HCC) 1995 and 2005    Heart Attack   Family History  Problem Relation Age of Onset  . Diabetes Mother   . Cancer Mother     Pancreatic  . Heart disease Mother     After age 74  . Hypertension Mother   . Hyperlipidemia Mother   . Diabetes Sister   . Cancer Sister   . Cancer Sister     Bone  . Cancer Father    SOCIAL HISTORY: Social History   Social History  . Marital Status: Married    Spouse Name: N/A  . Number of Children: N/A  . Years of Education: N/A   Occupational History  . Not on file.   Social History Main Topics  . Smoking status: Former Smoker    Types: Pipe    Quit date: 02/18/2011  . Smokeless tobacco: Never Used  . Alcohol Use: No  . Drug Use: No  . Sexual Activity: Not on file   Other Topics Concern  . Not on file   Social History Narrative   Allergies  Allergen Reactions  .  Cilostazol Other (See Comments)    Feels like feet on fire   Current Outpatient Prescriptions  Medication Sig Dispense Refill  . acetaminophen (TYLENOL) 325 MG tablet Take 2 tablets (650 mg total) by mouth every 6 (six) hours as needed for mild pain (or Fever >/= 101).    [name] albuterol (PROVENTIL) (2.5 MG/3ML) 0.083% nebulizer solution Take 3 mLs (2.5 mg total) by nebulization every 6 (six) hours as needed for wheezing. 75 mL 12  . amLODipine (NORVASC) 10 MG tablet Take 10 mg by mouth daily.    [name] aspirin 81 MG tablet Take 81 mg by mouth daily.    . Cinnamon 500 MG TABS Take 1 tablet by mouth daily.    [name] docusate sodium 100 MG CAPS Take 100 mg by mouth 2 (two) times daily. 10 capsule 0  . escitalopram (LEXAPRO) 10 MG tablet Take 10 mg by mouth at bedtime.     . gabapentin (NEURONTIN) 300 MG capsule Take 300 mg  by mouth 2 (two) times daily.     . insulin aspart (NOVOLOG) 100 UNIT/ML injection Inject 2-15 Units into the skin 3 (three) times daily with meals. Sliding Scale    . insulin glargine (LANTUS) 100 UNIT/ML injection Inject 0.2 mLs (20 Units total) into the skin 2 (two) times daily. (Patient taking differently: Inject 27 Units into the skin 2 (two) times daily. ) 10 mL 11  . isosorbide mononitrate (IMDUR) 30 MG 24 hr tablet Take 30 mg by mouth daily.    [name] losartan (COZAAR) 100 MG tablet Take 100 mg by mouth daily.    . metFORMIN (GLUCOPHAGE) 1000 MG tablet Take 1,000 mg by mouth 2 (two) times daily with a meal.    . metoprolol (TOPROL-XL) 100 MG 24 hr tablet Take 100 mg by mouth 2 (two) times daily.      . polyethylene glycol (MIRALAX / GLYCOLAX) packet Take 17 g by mouth daily as needed for mild constipation. 14 each 0  . rosuvastatin (CRESTOR) 40 MG tablet Take 40 mg by mouth every evening.    . Amino Acids-Protein Hydrolys (FEEDING SUPPLEMENT, PRO-STAT SUGAR FREE 64,) LIQD Take 30 mLs by mouth 2 (two) times daily between meals. (Patient not taking: Reported on 01/11/2015) 900 mL 0  . feeding supplement, GLUCERNA SHAKE, (GLUCERNA SHAKE) LIQD Take 237 mLs by mouth 2 (two) times daily between meals. (Patient not taking: Reported on 01/11/2015)  0  . Insulin Detemir (LEVEMIR [place]) Inject 25 Units into the skin 2 (two) times daily.    . nitroGLYCERIN (NITRODUR - DOSED IN MG/24 HR) 0.2 mg/hr patch   3  . ondansetron (ZOFRAN) 4 MG tablet Take 1 tablet (4 mg total) by mouth every 6 (six) hours as needed for nausea. (Patient not taking: Reported on 07/06/2014) 20 tablet 0  . oxyCODONE (OXY IR/ROXICODONE) 5 MG immediate release tablet Take 1 tablet (5 mg total) by mouth every 4 (four) hours as needed for severe pain. (Patient not taking: Reported on 07/06/2014) 30 tablet 0  . OxyCODONE (OXYCONTIN) 20 mg T12A 12 hr tablet Take 20 mg by mouth as needed.    . pantoprazole (PROTONIX) 40 MG tablet Take 1 tablet (40 mg  total) by mouth daily. (Patient not taking: Reported on 01/11/2015)    . silver sulfADIAZINE (SILVADENE) 1 % cream Apply 1 application topically daily.  3   No current facility-administered medications for this visit.   REVIEW OF SYSTEMS:   denotes positive finding,  denotes negative finding Cardiac  Comments:  Chest pain or chest pressure:    Shortness of breath upon exertion: X   Short of breath when lying flat:    Irregular heart rhythm:        Vascular    Pain in calf, thigh, or hip brought on by ambulation:    Pain in feet at night that wakes you up from your sleep:     Blood clot in your veins:    Leg swelling:         Pulmonary    Oxygen at home:    Productive cough:     Wheezing:  X       Neurologic    Sudden weakness in arms or legs:     Sudden numbness in arms or legs:     Sudden onset of difficulty speaking or slurred speech:    Temporary loss of vision in one eye:     Problems with dizziness:         Gastrointestinal    Blood in stool:     Vomited blood:         Genitourinary    Burning when urinating:     Blood in urine:        Psychiatric    Major depression:         Hematologic    Bleeding problems:    Problems with blood clotting too easily:        Skin    Rashes or ulcers:        Constitutional    Fever or chills:      PHYSICAL EXAM: Filed Vitals:   01/11/15 1004  BP: 134/59  Pulse: 77  Temp: 97.8 F (36.6 C)  Resp: 14  Height:  (1.753 m)  Weight: 234 lb (106.142 kg)  SpO2: 100%   GENERAL: The patient is a well-nourished male, in no acute distress. The vital signs are documented above. CARDIAC: There is a regular rate and rhythm.  VASCULAR: I do not detect carotid bruits. He has palpable femoral pulses. I cannot palpate pedal pulses. He does have a fairly brisk peroneal signal on the right with the Doppler. He also has an anterior tibial and posterior tibial signal. PULMONARY: There is good air exchange bilaterally  without wheezing or rales. ABDOMEN: Soft and non-tender with normal pitched bowel sounds.  MUSCULOSKELETAL: his left below the knee amputation site is healed. NEUROLOGIC: No focal weakness or paresthesias are detected. SKIN: He has a fungal rash on the dorsum of his right foot. He has dry skin but no open ulcers. PSYCHIATRIC: The patient has a normal affect.  DATA:  I have independently interpreted his arterial Doppler study today which shows monophasic Doppler signals in the right dorsalis pedis and posterior tibial positions. ABI on the right is 86%. Toe pressure on the right is 78 mmHg.  MEDICAL ISSUES:  ATHEROSCLEROSIS WITH ULCERATION: The wounds on his right foot have healed. He does have some dry skin and I have encouraged him to keep the skin well lubricated. I have also encouraged him to use nystatin cream on the fungal infection on the dorsum of his foot. Given that he has reasonable Doppler flow in the foot, and a toe pressure of 78 mmHg, I would not recommend addressing the disease of his tibial peroneal trunk and lesser developed a nonhealing wound and clearly had a limb threatening situation. Based on his previous arteriogram that would be the only option and would be associated with significant risk  given his small calcified vessels. I plan on seeing him back in 6 months with follow up ABIs which I have ordered. He knows to call sooner if he has problems.   [name] [facility] [place] Beeper: [phone]

## 2015-06-15 ENCOUNTER — Encounter: Payer: Medicare Other | Attending: Surgery | Admitting: Surgery

## 2015-06-15 DIAGNOSIS — E11622 Type 2 diabetes mellitus with other skin ulcer: Secondary | ICD-10-CM | POA: Insufficient documentation

## 2015-06-15 DIAGNOSIS — Z89512 Acquired absence of left leg below knee: Secondary | ICD-10-CM | POA: Insufficient documentation

## 2015-06-15 DIAGNOSIS — F329 Major depressive disorder, single episode, unspecified: Secondary | ICD-10-CM | POA: Diagnosis not present

## 2015-06-15 DIAGNOSIS — I509 Heart failure, unspecified: Secondary | ICD-10-CM | POA: Insufficient documentation

## 2015-06-15 DIAGNOSIS — E1151 Type 2 diabetes mellitus with diabetic peripheral angiopathy without gangrene: Secondary | ICD-10-CM | POA: Diagnosis not present

## 2015-06-15 DIAGNOSIS — I110 Hypertensive heart disease with heart failure: Secondary | ICD-10-CM | POA: Diagnosis not present

## 2015-06-15 DIAGNOSIS — B359 Dermatophytosis, unspecified: Secondary | ICD-10-CM | POA: Insufficient documentation

## 2015-06-15 DIAGNOSIS — Z87891 Personal history of nicotine dependence: Secondary | ICD-10-CM | POA: Insufficient documentation

## 2015-06-15 DIAGNOSIS — Z794 Long term (current) use of insulin: Secondary | ICD-10-CM | POA: Insufficient documentation

## 2015-06-15 DIAGNOSIS — L97211 Non-pressure chronic ulcer of right calf limited to breakdown of skin: Secondary | ICD-10-CM | POA: Diagnosis not present

## 2015-06-15 DIAGNOSIS — E1142 Type 2 diabetes mellitus with diabetic polyneuropathy: Secondary | ICD-10-CM | POA: Insufficient documentation

## 2015-06-15 DIAGNOSIS — I70232 Atherosclerosis of native arteries of right leg with ulceration of calf: Secondary | ICD-10-CM | POA: Insufficient documentation

## 2015-06-15 DIAGNOSIS — I2510 Atherosclerotic heart disease of native coronary artery without angina pectoris: Secondary | ICD-10-CM | POA: Insufficient documentation

## 2015-06-15 NOTE — Progress Notes (Addendum)
Jordan [name], Emanual ([id number]) Visit Report for 06/15/2015 Abuse/Suicide Risk Screen Details Patient Name: Jordan [name], Rayman Date of Service: 06/15/2015 1:30 PM Medical Record Number: [id number] Patient Account Number: [id number] Date of Birth/Sex: March 21, 1941 [age] y.o. Male) Treating RN: Clover MealyAfful, RN, BSN, [place] Sinkita Primary Care Physician: Lindwood QuaHOFFMAN, BYRON Other Clinician: Referring Physician: Lindwood QuaHOFFMAN, BYRON Treating Physician/Extender: Rudene ReBritto, Errol Weeks in Treatment: 0 Abuse/Suicide Risk Screen Items Answer ABUSE/SUICIDE RISK SCREEN: Has anyone close to you tried to hurt or harm you recentlyo No Do you feel uncomfortable with anyone in your familyo No Has anyone forced you do things that you didnot want to doo No Do you have any thoughts of harming yourselfo No Patient displays signs or symptoms of abuse and/or neglect. No Electronic Signature(s) Signed: 06/15/2015 5:44:17 PM By: Elpidio EricAfful, Rita BSN, RN Entered By: Elpidio EricAfful, Rita on 06/15/2015 13:39:37 Jordan [name], Isaias ([id number]) -------------------------------------------------------------------------------- Activities of Daily Living Details Patient Name: Jordan [name], Raymar Date of Service: 06/15/2015 1:30 PM Medical Record Number: [id number] Patient Account Number: [id number] Date of Birth/Sex: March 21, 1941 [age] y.o. Male) Treating RN: Clover MealyAfful, RN, BSN, [place] Sinkita Primary Care Physician: Lindwood QuaHOFFMAN, BYRON Other Clinician: Referring Physician: Lindwood QuaHOFFMAN, BYRON Treating Physician/Extender: Rudene ReBritto, Errol Weeks in Treatment: 0 Activities of Daily Living Items Answer Activities of Daily Living (Please select one for each item) Drive Automobile Completely Able Take Medications Completely Able Use Telephone Completely Able Care for Appearance Completely Able Use Toilet Completely Able Bath / Shower Completely Able Dress Self Completely Able Feed Self Completely Able Walk Completely Able Get In / Out Bed Completely Able Housework Completely Able Prepare Meals Completely  Able Handle Money Completely Able Shop for Self Completely Able Electronic Signature(s) Signed: 06/15/2015 5:44:17 PM By: Elpidio EricAfful, Rita BSN, RN Entered By: Elpidio EricAfful, Rita on 06/15/2015 13:41:38 Jordan [name], Laythan ([id number]) -------------------------------------------------------------------------------- Education Assessment Details Patient Name: Jordan [name], Ameir Date of Service: 06/15/2015 1:30 PM Medical Record Number: [id number] Patient Account Number: [id number] Date of Birth/Sex: March 21, 1941 [age] y.o. Male) Treating RN: Clover MealyAfful, RN, BSN, [place] Sinkita Primary Care Physician: Lindwood QuaHOFFMAN, BYRON Other Clinician: Referring Physician: Lindwood QuaHOFFMAN, BYRON Treating Physician/Extender: Rudene ReBritto, Errol Weeks in Treatment: 0 Primary Learner Assessed: Patient Learning Preferences/Education Level/Primary Language Learning Preference: Explanation Highest Education Level: College or Above Preferred Language: English Cognitive Barrier Assessment/Beliefs Language Barrier: No Physical Barrier Assessment Impaired Vision: No Impaired Hearing: No Decreased Hand dexterity: No Knowledge/Comprehension Assessment Knowledge Level: Medium Comprehension Level: Medium Ability to understand written Medium instructions: Ability to understand verbal Medium instructions: Motivation Assessment Anxiety Level: Calm Cooperation: Cooperative Education Importance: Acknowledges Need Interest in Health Problems: Asks Questions Perception: Coherent Willingness to Engage in Self- Medium Management Activities: Readiness to Engage in Self- Medium Management Activities: Electronic Signature(s) Signed: 06/15/2015 12:41:26 PM By: Elpidio EricAfful, Rita BSN, RN Entered By: Elpidio EricAfful, Rita on 06/15/2015 12:41:26 Jordan [name], Winfred ([id number]) -------------------------------------------------------------------------------- Fall Risk Assessment Details Patient Name: Jordan [name], Nethaniel Date of Service: 06/15/2015 1:30 PM Medical Record Number: [id number] Patient  Account Number: [id number] Date of Birth/Sex: March 21, 1941 [age] y.o. Male) Treating RN: Clover MealyAfful, RN, BSN, Rita Primary Care Physician: Lindwood QuaHOFFMAN, BYRON Other Clinician: Referring Physician: Lindwood QuaHOFFMAN, BYRON Treating Physician/Extender: Rudene ReBritto, Errol Weeks in Treatment: 0 Fall Risk Assessment Items Have you had 2 or more falls in the last 12 monthso 0 No Have you had any fall that resulted in injury in the last 12 monthso 0 No FALL RISK ASSESSMENT: History of falling - immediate or within 3 months 25 Yes Secondary diagnosis 0 No Ambulatory aid None/bed rest/wheelchair/nurse 0 No Crutches/cane/walker 15 Yes Furniture 0 No IV Access/Saline Lock 0 No Gait/Training Normal/bed rest/immobile 0 No Weak 10  Yes Impaired 20 Yes Mental Status Oriented to own ability 0 Yes Electronic Signature(s) Signed: 06/15/2015 5:44:17 PM By: Elpidio Eric BSN, RN Entered By: Elpidio Eric on 06/15/2015 13:40:29 Jordan Cheeks ([id number]) -------------------------------------------------------------------------------- Foot Assessment Details Patient Name: Jordan Cheeks Date of Service: 06/15/2015 1:30 PM Medical Record Number: [id number] Patient Account Number: [id number] Date of Birth/Sex: 01-28-1941 (75 y.o. Male) Treating RN: Clover Mealy, RN, BSN, Rita Primary Care Physician: [name] Other Clinician: Referring Physician: [name] Treating Physician/Extender: Rudene Re in Treatment: 0 Foot Assessment Items Site Locations + = Sensation present, - = Sensation absent, C = Callus, U = Ulcer R = Redness, W = Warmth, M = Maceration, PU = Pre-ulcerative lesion F = Fissure, S = Swelling, D = Dryness Assessment Right: Left: Other Deformity: No No Prior Foot Ulcer: No No Prior Amputation: No No Charcot Joint: No No Ambulatory Status: Ambulatory With Help Assistance Device: Cane Gait: Surveyor, mining) Signed: 06/15/2015 5:44:17 PM By: Elpidio Eric BSN, RN Entered By: Elpidio Eric on  06/15/2015 13:40:58 Jordan Cheeks ([id number]) -------------------------------------------------------------------------------- Nutrition Risk Assessment Details Patient Name: Jordan Cheeks Date of Service: 06/15/2015 1:30 PM Medical Record Number: [id number] Patient Account Number: [id number] Date of Birth/Sex: 1940/08/10 (75 y.o. Male) Treating RN: Clover Mealy, RN, BSN, Rita Primary Care Physician: [name] Other Clinician: Referring Physician: [name] Treating Physician/Extender: Rudene Re in Treatment: 0 Height (in): 69 Weight (lbs): 207 Body Mass Index (BMI): 30.6 Nutrition Risk Assessment Items NUTRITION RISK SCREEN: I have an illness or condition that made me change the kind and/or 0 No amount of food I eat I eat fewer than two meals per day 0 No I eat few fruits and vegetables, or milk products 0 No I have three or more drinks of beer, liquor or wine almost every day 0 No I have tooth or mouth problems that make it hard for me to eat 0 No I don't always have enough money to buy the food I need 0 No I eat alone most of the time 0 No I take three or more different prescribed or over-the-counter drugs a 0 No day Without wanting to, I have lost or gained 10 pounds in the last six 0 No months I am not always physically able to shop, cook and/or feed myself 0 No Nutrition Protocols Good Risk Protocol 0 No interventions needed Moderate Risk Protocol Electronic Signature(s) Signed: 06/15/2015 5:44:17 PM By: Elpidio Eric BSN, RN Entered By: Elpidio Eric on 06/15/2015 13:41:05

## 2015-06-15 NOTE — Progress Notes (Addendum)
THEDORE, PICKEL ([id number]) Visit Report for 06/15/2015 Allergy List Details Patient Name: Jordan [name], Jordan [name] Date of Service: 06/15/2015 1:30 PM Medical Record Number: [id number] Patient Account Number: [id number] Date of Birth/Sex: [birth year] (75 y.o. Male) Treating RN: Clover Mealy, RN, BSN, [place] Sink Primary Care Physician: [name] Other Clinician: Referring Physician: [name] Treating Physician/Extender: Rudene Re in Treatment: 0 Allergies Active Allergies no allergies Allergy Notes Electronic Signature(s) Signed: 06/15/2015 12:38:42 PM By: Elpidio Eric BSN, RN Entered By: Elpidio Eric on 06/15/2015 12:38:42 Jordan [name] ([id number]) -------------------------------------------------------------------------------- Arrival Information Details Patient Name: Jordan [name] Date of Service: 06/15/2015 1:30 PM Medical Record Number: [id number] Patient Account Number: [id number] Date of Birth/Sex: 1940-08-24 (75 y.o. Male) Treating RN: Clover Mealy, RN, BSN, [place] Sink Primary Care Physician: [name] Other Clinician: Referring Physician: [name] Treating Physician/Extender: Rudene Re in Treatment: 0 Visit Information Patient Arrived: [name] Time: 13:38 Accompanied By: wife Transfer Assistance: None Patient Identification Verified: Yes Secondary Verification Process Completed: Yes Patient Requires Transmission-Based No Precautions: Patient Has Alerts: No History Since Last Visit Added or deleted any medications: No Any new allergies or adverse reactions: No Had a fall or experienced change in activities of daily living that may affect risk of falls: No Signs or symptoms of abuse/neglect since last visito No Has Dressing in Place as Prescribed: Yes Electronic Signature(s) Signed: 06/15/2015 5:44:17 PM By: Elpidio Eric BSN, RN Entered By: Elpidio Eric on 06/15/2015 13:38:56 Jordan [name]  ([id number]) -------------------------------------------------------------------------------- Clinic Level of Care Assessment Details Patient Name: Jordan [name] Date of Service: 06/15/2015 1:30 PM Medical Record Number: [id number] Patient Account Number: [id number] Date of Birth/Sex: [birth year] (75 y.o. Male) Treating RN: Clover Mealy, RN, BSN, Rita Primary Care Physician: [name] Other Clinician: Referring Physician: [name] Treating Physician/Extender: Rudene Re in Treatment: 0 Clinic Level of Care Assessment Items TOOL 4 Quantity Score  - Use when only an EandM is performed on FOLLOW-UP visit 0 ASSESSMENTS - Nursing Assessment / Reassessment X - Reassessment of Co-morbidities (includes updates in patient status) 1 10 X - Reassessment of Adherence to Treatment Plan 1 5 ASSESSMENTS - Wound and Skin Assessment / Reassessment  - Simple Wound Assessment / Reassessment - one wound 0 X - Complex Wound Assessment / Reassessment - multiple wounds 2 5  - Dermatologic / Skin Assessment (not related to wound area) 0 ASSESSMENTS - Focused Assessment  - Circumferential Edema Measurements - multi extremities 0  - Nutritional Assessment / Counseling / Intervention 0 X - Lower Extremity Assessment (monofilament, tuning fork, pulses) 1 5  - Peripheral Arterial Disease Assessment (using hand held doppler) 0 ASSESSMENTS - Ostomy and/or Continence Assessment and Care  - Incontinence Assessment and Management 0  - Ostomy Care Assessment and Management (repouching, etc.) 0 PROCESS - Coordination of Care X - Simple Patient / Family Education for ongoing care 1 15  - Complex (extensive) Patient / Family Education for ongoing care 0  - Staff obtains Chiropractor, Records, Test Results / Process Orders 0  - Staff telephones HHA, Nursing Homes / Clarify orders / etc 0  - Routine Transfer to another Facility (non-emergent condition) 0 TERRON, MERFELD ([id number])  -  Routine Hospital Admission (non-emergent condition) 0 X - New Admissions / Manufacturing engineer / Ordering NPWT, Apligraf, etc. 1 15  - Emergency Hospital Admission (emergent condition) 0  - Simple Discharge Coordination 0  - Complex (extensive) Discharge Coordination 0 PROCESS - Special Needs  - Pediatric / Minor Patient Management 0  - Isolation Patient Management 0  - Hearing /  Language / Visual special needs 0  - Assessment of Community assistance (transportation, D/C planning, etc.) 0  - Additional assistance / Altered mentation 0  - Support Surface(s) Assessment (bed, cushion, seat, etc.) 0 INTERVENTIONS - Wound Cleansing / Measurement  - Simple Wound Cleansing - one wound 0 X - Complex Wound Cleansing - multiple wounds 2 5 X - Wound Imaging (photographs - any number of wounds) 1 5  - Wound Tracing (instead of photographs) 0  - Simple Wound Measurement - one wound 0 X - Complex Wound Measurement - multiple wounds 2 5 INTERVENTIONS - Wound Dressings X - Small Wound Dressing one or multiple wounds 2 10  - Medium Wound Dressing one or multiple wounds 0  - Large Wound Dressing one or multiple wounds 0  - Application of Medications - topical 0  - Application of Medications - injection 0 INTERVENTIONS - Miscellaneous  - External ear exam 0 RUTGER, SALTON ([id number])  - Specimen Collection (cultures, biopsies, blood, body fluids, etc.) 0  - Specimen(s) / Culture(s) sent or taken to Lab for analysis 0  - Patient Transfer (multiple staff / Michiel Sites Lift / Similar devices) 0  - Simple Staple / Suture removal (25 or less) 0  - Complex Staple / Suture removal (26 or more) 0  - Hypo / Hyperglycemic Management (close monitor of Blood Glucose) 0 X - Ankle / Brachial Index (ABI) - do not check if billed separately 1 15 X - Vital Signs 1 5 Has the patient been seen at the hospital within the last three years: Yes Total Score: 125 Level Of  Care: New/Established - Level 4 Electronic Signature(s) Signed: 06/15/2015 5:44:17 PM By: Elpidio Eric BSN, RN Entered By: Elpidio Eric on 06/15/2015 14:29:46 Jordan [name] ([id number]) -------------------------------------------------------------------------------- Encounter Discharge Information Details Patient Name: Jordan [name] Date of Service: 06/15/2015 1:30 PM Medical Record Number: [id number] Patient Account Number: [id number] Date of Birth/Sex: May 08, 1940 (75 y.o. Male) Treating RN: Clover Mealy, RN, BSN, [place] Sink Primary Care Physician: [name] Other Clinician: Referring Physician: [name] Treating Physician/Extender: Rudene Re in Treatment: 0 Encounter Discharge Information Items Discharge Pain Level: 0 Discharge Condition: Stable Ambulatory Status: Cane Discharge Destination: Home Transportation: Private Auto Accompanied By: spouse Schedule Follow-up Appointment: No Medication Reconciliation completed No and provided to Patient/Care Mauriana Dann: Provided on Clinical Summary of Care: 06/15/2015 Form Type Recipient Paper Patient TF Electronic Signature(s) Signed: 06/15/2015 2:42:58 PM By: Francie Massing Entered By: Francie Massing on 06/15/2015 14:42:58 Jordan [name] ([id number]) -------------------------------------------------------------------------------- Lower Extremity Assessment Details Patient Name: Jordan [name] Date of Service: 06/15/2015 1:30 PM Medical Record Number: [id number] Patient Account Number: [id number] Date of Birth/Sex: 1940-10-30 (75 y.o. Male) Treating RN: Clover Mealy, RN, BSN, Rita Primary Care Physician: [name] Other Clinician: Referring Physician: [name] Treating Physician/Extender: Rudene Re in Treatment: 0 Edema Assessment Assessed: [Left: No] [Right: No] E[Left: dema] [Right: :] Calf Left: Right: Point of Measurement: 32 cm From Medial Instep cm 38.1 cm Ankle Left: Right: Point of Measurement: 8 cm From  Medial Instep cm 22.1 cm Vascular Assessment Claudication: Claudication Assessment [Right:None] Pulses: Posterior Tibial Palpable: [Right:No] Doppler: [Right:Monophasic] Dorsalis Pedis Palpable: [Right:Yes] Doppler: [Right:Monophasic] Extremity colors, hair growth, and conditions: Extremity Color: [Right:Red] Hair Growth on Extremity: [Right:Yes] Temperature of Extremity: [Right:Warm] Capillary Refill: [Right:< 3 seconds] Blood Pressure: Brachial: [Right:124] Dorsalis Pedis: [Left:Dorsalis Pedis:] Ankle: Posterior Tibial: [Left:Posterior Tibial: 115] [Right:0.93] Toe Nail Assessment Left: Right: Thick: Yes Discolored: Yes ISAURO, SKELLEY ([id number]) Deformed: No Improper Length and Hygiene: No Electronic Signature(s) Signed: 06/15/2015 5:44:17  PM By: Elpidio Eric BSN, RN Entered By: Elpidio Eric on 06/15/2015 14:08:14 Jordan [name] ([id number]) -------------------------------------------------------------------------------- Multi Wound Chart Details Patient Name: Jordan [name] Date of Service: 06/15/2015 1:30 PM Medical Record Number: [id number] Patient Account Number: [id number] Date of Birth/Sex: 02-02-1941 (75 y.o. Male) Treating RN: Clover Mealy, RN, BSN, [place] Sink Primary Care Physician: [name] Other Clinician: Referring Physician: [name] Treating Physician/Extender: Rudene Re in Treatment: 0 Vital Signs Height(in): 69 Pulse(bpm): 53 Weight(lbs): 243 Blood Pressure 124/52 (mmHg): Body Mass Index(BMI): 36 Temperature(F): 97.5 Respiratory Rate 17 (breaths/min): Photos: [3:No Photos] [4:No Photos] [N/A:N/A] Wound Location: [3:Right Lower Leg - Circumfernential] [4:Right Foot - Dorsal] [N/A:N/A] Wounding Event: [3:Gradually Appeared] [4:Gradually Appeared] [N/A:N/A] Primary Etiology: [3:Cellulitis] [4:Cellulitis] [N/A:N/A] Comorbid History: [3:Cataracts, Anemia, Congestive Heart Failure, Coronary Artery Disease, Hypertension, Peripheral Venous  Disease, Type II Diabetes, Osteomyelitis, Neuropathy] [4:Cataracts, Anemia, Congestive Heart Failure, Coronary Artery Disease,  Hypertension, Peripheral Venous Disease, Type II Diabetes, Osteomyelitis, Neuropathy] [N/A:N/A] Date Acquired: [3:04/11/2015] [4:04/04/2015] [N/A:N/A] Weeks of Treatment: [3:0] [4:0] [N/A:N/A] Wound Status: [3:Open] [4:Open] [N/A:N/A] Measurements L x W x D 10x21.5x0.1 [4:7.5x7.5x0.1] [N/A:N/A] (cm) Area (cm) : [7:829.562] [4:44.179] [N/A:N/A] Volume (cm) : [3:16.886] [4:4.418] [N/A:N/A] % Reduction in Area: [3:0.00%] [4:0.00%] [N/A:N/A] % Reduction in Volume: 0.00% [4:0.00%] [N/A:N/A] Classification: [3:Partial Thickness] [4:Partial Thickness] [N/A:N/A] HBO Classification: [3:Grade 0] [4:Grade 0] [N/A:N/A] Exudate Amount: [3:Small] [4:Small] [N/A:N/A] Exudate Type: [3:Serous] [4:N/A] [N/A:N/A] Exudate Color: [3:amber] [4:N/A] [N/A:N/A] Wound Margin: [3:Distinct, outline attached] [4:Distinct, outline attached] [N/A:N/A] Granulation Amount: [3:N/A] [4:Medium (34-66%)] [N/A:N/A] Necrotic Amount: [3:N/A] [4:Small (1-33%)] [N/A:N/A] Exposed Structures: [N/A:N/A] Fascia: No Fascia: No Fat: No Fat: No Tendon: No Tendon: No Muscle: No Muscle: No Joint: No Joint: No Bone: No Bone: No Limited to Skin Limited to Skin Breakdown Breakdown Epithelialization: None None N/A Periwound Skin Texture: Edema: No Edema: Yes N/A Excoriation: No Excoriation: No Induration: No Induration: No Callus: No Callus: No Crepitus: No Crepitus: No Fluctuance: No Fluctuance: No Friable: No Friable: No Rash: No Rash: No Scarring: No Scarring: No Periwound Skin Moist: Yes Moist: Yes N/A Moisture: Maceration: No Maceration: No Dry/Scaly: No Dry/Scaly: No Periwound Skin Color: Erythema: Yes Atrophie Blanche: No N/A Atrophie Blanche: No Cyanosis: No Cyanosis: No Ecchymosis: No Ecchymosis: No Erythema: No Hemosiderin Staining: No Hemosiderin Staining:  No Mottled: No Mottled: No Pallor: No Pallor: No Rubor: No Rubor: No Erythema Location: Circumferential N/A N/A Temperature: No Abnormality No Abnormality N/A Tenderness on No No N/A Palpation: Wound Preparation: Ulcer Cleansing: Other: Ulcer Cleansing: Other: N/A soap and water soap and water Topical Anesthetic Topical Anesthetic Applied: None Applied: None Treatment Notes Electronic Signature(s) Signed: 06/15/2015 5:44:17 PM By: Elpidio Eric BSN, RN Entered By: Elpidio Eric on 06/15/2015 14:23:48 Jordan [name] ([id number]) -------------------------------------------------------------------------------- Multi-Disciplinary Care Plan Details Patient Name: Jordan [name] Date of Service: 06/15/2015 1:30 PM Medical Record Number: [id number] Patient Account Number: [id number] Date of Birth/Sex: 05-28-1940 (75 y.o. Male) Treating RN: Clover Mealy, RN, BSN, Rita Primary Care Physician: [name] Other Clinician: Referring Physician: [name] Treating Physician/Extender: Rudene Re in Treatment: 0 Active Inactive Electronic Signature(s) Signed: 06/15/2015 5:44:17 PM By: Elpidio Eric BSN, RN Entered By: Elpidio Eric on 06/15/2015 14:22:05 Jordan [name] ([id number]) -------------------------------------------------------------------------------- Pain Assessment Details Patient Name: Jordan [name] Date of Service: 06/15/2015 1:30 PM Medical Record Number: [id number] Patient Account Number: [id number] Date of Birth/Sex: 19-Feb-1941 (75 y.o. Male) Treating RN: Clover Mealy, RN, BSN, Rita Primary Care Physician: [name] Other Clinician: Referring Physician: [name] Treating Physician/Extender: Rudene Re in Treatment: 0 Active Problems Location of Pain Severity  and Description of Pain Patient Has Paino No Site Locations Pain Management and Medication Current Pain Management: Electronic Signature(s) Signed: 06/15/2015 5:44:17 PM By: Elpidio Eric BSN,  RN Entered By: Elpidio Eric on 06/15/2015 13:39:26 Jordan [name] ([id number]) -------------------------------------------------------------------------------- Patient/Caregiver Education Details Patient Name: Jordan [name] Date of Service: 06/15/2015 1:30 PM Medical Record Number: [id number] Patient Account Number: [id number] Date of Birth/Gender: Jan 21, 1941 (75 y.o. Male) Treating RN: Clover Mealy, RN, BSN, Rita Primary Care Physician: [name] Other Clinician: Referring Physician: [name] Treating Physician/Extender: Rudene Re in Treatment: 0 Education Assessment Education Provided To: Patient Education Topics Provided Basic Hygiene: Methods: Explain/Verbal Responses: State content correctly Wound/Skin Impairment: Methods: Explain/Verbal Responses: State content correctly Electronic Signature(s) Signed: 06/15/2015 5:44:17 PM By: Elpidio Eric BSN, RN Entered By: Elpidio Eric on 06/15/2015 14:26:31 Jordan [name] ([id number]) -------------------------------------------------------------------------------- Wound Assessment Details Patient Name: Jordan [name] Date of Service: 06/15/2015 1:30 PM Medical Record Number: [id number] Patient Account Number: [id number] Date of Birth/Sex: 10-27-1940 (75 y.o. Male) Treating RN: Clover Mealy, RN, BSN, Rita Primary Care Physician: [name] Other Clinician: Referring Physician: [name] Treating Physician/Extender: Rudene Re in Treatment: 0 Wound Status Wound Number: 3 Primary Cellulitis Etiology: Wound Location: Right Lower Leg - Circumfernential Wound Open Status: Wounding Event: Gradually Appeared Comorbid Cataracts, Anemia, Congestive Heart Date Acquired: 04/11/2015 History: Failure, Coronary Artery Disease, Weeks Of Treatment: 0 Hypertension, Peripheral Venous Clustered Wound: No Disease, Type II Diabetes, Osteomyelitis, Neuropathy Photos Photo Uploaded By: Elpidio Eric on 06/15/2015 16:46:13 Wound  Measurements Length: (cm) 10 Width: (cm) 21.5 Depth: (cm) 0.1 Area: (cm) 168.861 Volume: (cm) 16.886 % Reduction in Area: 0% % Reduction in Volume: 0% Epithelialization: None Tunneling: No Undermining: No Wound Description Classification: Partial Thickness Diabetic Severity [address]): Grade 0 SIMRAN, MANNIS ([id number]) Foul Odor After Cleansing: No Wound Margin: Distinct, outline attached Exudate Amount: Small Exudate Type: Serous Exudate Color: amber Wound Bed Necrotic Amount: None Present (0%) Exposed Structure Fascia Exposed: No Fat Layer Exposed: No Tendon Exposed: No Muscle Exposed: No Joint Exposed: No Bone Exposed: No Limited to Skin Breakdown Periwound Skin Texture Texture Color No Abnormalities Noted: No No Abnormalities Noted: No Callus: No Atrophie Blanche: No Crepitus: No Cyanosis: No Excoriation: No Ecchymosis: No Fluctuance: No Erythema: Yes Friable: No Erythema Location: Circumferential Induration: No Hemosiderin Staining: No Localized Edema: No Mottled: No Rash: No Pallor: No Scarring: No Rubor: No Moisture Temperature / Pain No Abnormalities Noted: No Temperature: No Abnormality Dry / Scaly: No Maceration: No Moist: Yes Wound Preparation Ulcer Cleansing: Other: soap and water, Topical Anesthetic Applied: None Treatment Notes Wound #3 (Right, Circumferential Lower Leg) 1. Cleansed with: Cleanse wound with antibacterial soap and water 3. Peri-wound Care: Antifungal cream 5. Secondary Dressing Applied Kerlix/Conform 7. Secured with Jordan [name] ([id number]) Tape Electronic Signature(s) Signed: 06/15/2015 5:44:17 PM By: Elpidio Eric BSN, RN Entered By: Elpidio Eric on 06/15/2015 13:58:36 Jordan [name] ([id number]) -------------------------------------------------------------------------------- Wound Assessment Details Patient Name: Jordan [name] Date of Service: 06/15/2015 1:30 PM Medical Record Number: [id number] Patient  Account Number: [id number] Date of Birth/Sex: 23-Oct-1940 (75 y.o. Male) Treating RN: Clover Mealy, RN, BSN, [place] Sink Primary Care Physician: [name] Other Clinician: Referring Physician: [name] Treating Physician/Extender: Rudene Re in Treatment: 0 Wound Status Wound Number: 4 Primary Cellulitis Etiology: Wound Location: Right Foot - Dorsal Wound Open Wounding Event: Gradually Appeared Status: Date Acquired: 04/04/2015 Comorbid Cataracts, Anemia, Congestive Heart Weeks Of Treatment: 0 History: Failure, Coronary Artery Disease, Clustered Wound: No Hypertension, Peripheral Venous Disease, Type II Diabetes, Osteomyelitis, Neuropathy Photos Photo Uploaded By:  Elpidio Ericfful, Rita on 06/15/2015 16:47:18 Wound Measurements Length: (cm) 7.5 Width: (cm) 7.5 Depth: (cm) 0.1 Area: (cm) 44.179 Volume: (cm) 4.418 % Reduction in Area: 0% % Reduction in Volume: 0% Epithelialization: None Tunneling: No Undermining: No Wound Description Classification: Partial Thickness Foul O Diabetic Severity ([place]): Grade 0 Wound Margin: Distinct, outline attached Exudate Amount: Small dor After Cleansing: No Wound Bed Granulation Amount: Medium (34-66%) Exposed Structure Necrotic Amount: Small (1-33%) Fascia Exposed: No Fat Layer Exposed: No Jordan CheeksFARRAR, Dillan ([id number]) Tendon Exposed: No Muscle Exposed: No Joint Exposed: No Bone Exposed: No Limited to Skin Breakdown Periwound Skin Texture Texture Color No Abnormalities Noted: No No Abnormalities Noted: No Callus: No Atrophie Blanche: No Crepitus: No Cyanosis: No Excoriation: No Ecchymosis: No Fluctuance: No Erythema: No Friable: No Hemosiderin Staining: No Induration: No Mottled: No Localized Edema: Yes Pallor: No Rash: No Rubor: No Scarring: No Temperature / Pain Moisture Temperature: No Abnormality No Abnormalities Noted: No Dry / Scaly: No Maceration: No Moist: Yes Wound Preparation Ulcer Cleansing: Other:  soap and water, Topical Anesthetic Applied: None Treatment Notes Wound #4 (Right, Dorsal Foot) 1. Cleansed with: Cleanse wound with antibacterial soap and water 3. Peri-wound Care: Antifungal cream 5. Secondary Dressing Applied Kerlix/Conform 7. Secured with Secretary/administratorTape Electronic Signature(s) Signed: 06/15/2015 5:44:17 PM By: Elpidio EricAfful, Rita BSN, RN Entered By: Elpidio EricAfful, Rita on 06/15/2015 14:00:39 Jordan CheeksFARRAR, Hason ([id number]) -------------------------------------------------------------------------------- Vitals Details Patient Name: Jordan CheeksFARRAR, Mariana Date of Service: 06/15/2015 1:30 PM Medical Record Number: [id number] Patient Account Number: [id number] Date of Birth/Sex: 1941-03-29 54(74 y.o. Male) Treating RN: Afful, RN, BSN, Rita Primary Care Physician: Lindwood QuaHOFFMAN, BYRON Other Clinician: Referring Physician: Lindwood QuaHOFFMAN, BYRON Treating Physician/Extender: Rudene ReBritto, Errol Weeks in Treatment: 0 Vital Signs Time Taken: 13:41 Temperature (F): 97.5 Height (in): 69 Pulse (bpm): 53 Source: Stated Respiratory Rate (breaths/min): 17 Weight (lbs): 243 Blood Pressure (mmHg): 124/52 Source: Stated Reference Range: 80 - 120 mg / dl Body Mass Index (BMI): 35.9 Electronic Signature(s) Signed: 06/15/2015 5:44:17 PM By: Elpidio EricAfful, Rita BSN, RN Entered By: Elpidio EricAfful, Rita on 06/15/2015 13:42:25

## 2015-06-16 NOTE — Progress Notes (Signed)
HARLES, EVETTS ([id number]) Visit Report for 06/15/2015 Chief Complaint Document Details Patient Name: Jordan [name], Jordan [name] Date of Service: 06/15/2015 1:30 PM Medical Record Number: [id number] Patient Account Number: [id number] Date of Birth/Sex: December 19, 1940 (75 y.o. Male) Treating RN: Clover Mealy, RN, BSN, [place] Sink Primary Care Physician: [name] Other Clinician: Referring Physician: [name] Treating Physician/Extender: Rudene Re in Treatment: 0 Information Obtained from: Patient Chief Complaint Patient presents to the wound care center for a consult due non healing wound to the right lower extremity which she's had for about 3 months now. Electronic Signature(s) Signed: 06/15/2015 2:54:49 PM By: Evlyn Kanner MD, FACS Entered By: Evlyn Kanner on 06/15/2015 14:54:49 Jordan [name] ([id number]) -------------------------------------------------------------------------------- HPI Details Patient Name: Jordan [name] Date of Service: 06/15/2015 1:30 PM Medical Record Number: [id number] Patient Account Number: [id number] Date of Birth/Sex: [birth year] (75 y.o. Male) Treating RN: Clover Mealy, RN, BSN, [place] Sink Primary Care Physician: [name] Other Clinician: Referring Physician: [name] Treating Physician/Extender: Rudene Re in Treatment: 0 History of Present Illness Location: right lower extremity Quality: Patient reports experiencing a dull pain to affected area(s). Severity: Patient states wound are getting worse. Duration: Patient has had the wound for > 3 months prior to seeking treatment at the wound center Timing: Pain in wound is Intermittent (comes and goes Context: The wound would happen gradually Modifying Factors: Other treatment(s) tried include: the patient was seen by his PCP who put the patient on clindamycin, dressed the wound with Silvadene and applied a 4-layer Profore compression Associated Signs and Symptoms: Patient reports having:redness to the  right lower extremity. HPI Description: Very pleasant 75 year old gentleman who comes along with his wife for a wound care consultation having been sent to [place] by his PCP. he comes for an unrelated problem but had been seen by as about a year ago as a one-time consult and the patient had followed up with his primary surgeon Dr. Lajoyce Corners. 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 He has had most of his surgeries and procedures done at the [facility] in [place] and of note on 03/24/2014 he was seen by Dr. 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 Prior to that his surgeon was Dr. Lajoyce Corners and Dr. Lajoyce Corners has been some procedures on his left foot for several months during the latter part of 2015. Dr. Edilia Bo is the patient's vascular surgeon and on 06/13/2014 Dr. Edilia Bo saw him for a non-healing ulcer on the right heel and did a aortogram and bilateral iliac arteriogram. 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 He also had a stenosis of the tibioperoneal trunk. No angioplasty was done at this stage and Dr. Edilia Bo was of the opinion that if his wound heals well then he would avoid surgery. However if his heel gets worse a PTA of the tibioperoneal trunk would be done and/ or a vein angioplasty. patient's diabetes is better controlled and his last Hemoglobin A1c was 8. has also been put on Bactrim for the last 2 weeks by his primary care doctor. He was last seen by Dr. [name] on 01/11/2015, and the arterial Doppler study  done shows monophasic  Doppler signals in the right dorsalis pedis and posterior tibial. ABI on the right is 0.86 and TBI was 78 mmHg or 0.56 Dr. Enos Fling opinion was at this stage he would not recommend any surgical intervention and if there was significant wounds which would not heal. The patient is a former smoker quit in November 2012. Most recently he was seen at his PCPs office on 06/08/2015 for an ulcer of the right shin which had been treated with Silvadene and oral clindamycin. 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 Electronic Signature(s) Signed: 06/15/2015 2:57:45 PM By: Evlyn Kanner MD, FACS Entered By: Evlyn Kanner on 06/15/2015 14:57:45 Jordan [name] ([id number]) -------------------------------------------------------------------------------- Physical Exam Details Patient Name: Jordan [name] Date of Service: 06/15/2015 1:30 PM Medical Record Number: [id number] Patient Account Number: [id number] Date of Birth/Sex: [birth year] (75 y.o. Male) Treating RN: Clover Mealy, RN, BSN, [place] Sink Primary Care Physician: [name] Other Clinician: Referring Physician: [name] Treating Physician/Extender: Rudene Re in Treatment: 0 Constitutional . Pulse regular. Respirations normal and unlabored. Afebrile. . Eyes Nonicteric. Reactive to light. Ears, Nose, Mouth, and Throat Lips, teeth, and gums WNL.[name] Moist mucosa without lesions. Neck supple and nontender. No palpable supraclavicular or cervical adenopathy. Normal sized without goiter. Respiratory WNL. No retractions.. Cardiovascular Pedal Pulses WNL. ABI on the right was 0.93. No clubbing, cyanosis or edema. Gastrointestinal (GI) Abdomen without masses or tenderness.. No liver or spleen enlargement or  tenderness.. Lymphatic No adneopathy. No adenopathy. No adenopathy. Musculoskeletal Adexa without tenderness or enlargement.. Digits and nails w/o clubbing, cyanosis, infection, petechiae, ischemia, or inflammatory conditions.. Integumentary (Hair, Skin) No suspicious lesions. No crepitus or fluctuance. No peri-wound warmth or erythema. No masses.[name] Psychiatric Judgement and insight Intact.. No evidence of depression, anxiety, or agitation.. Notes on the right lower third of his leg and on his foot he has got shiny pinkish skin which is very much like a fungal infection. He has few micro-ulcerations but no evidence of cross cellulitis. He has also had fungal infection of his nails. His proximal leg does not show any evidence of lymphedema or cellulitis. Electronic Signature(s) Signed: 06/15/2015 2:58:56 PM By: Evlyn Kanner MD, FACS Entered By: Evlyn Kanner on 06/15/2015 14:58:55 Jordan [name] ([id number]) -------------------------------------------------------------------------------- Physician Orders Details Patient Name: Jordan [name] Date of Service: 06/15/2015 1:30 PM Medical Record Number: [id number] Patient Account Number: [id number] Date of Birth/Sex: 1940-06-01 (75 y.o. Male) Treating RN: Clover Mealy, RN, BSN, [place] Sink Primary Care Physician: [name] Other Clinician: Referring Physician: [name] Treating Physician/Extender: Rudene Re in Treatment: 0 Verbal / Phone Orders: Yes Clinician: Afful, RN, BSN, Rita Read Back and Verified: Yes Diagnosis Coding Wound Cleansing Wound #3 Right,Circumferential Lower Leg o Cleanse wound with mild soap and water Wound #4 Right,Dorsal Foot o Cleanse wound with mild soap and water Skin Barriers/Peri-Wound Care Wound #3 Right,Circumferential Lower Leg o Other: - triamcenalone 0.1% Wound #4 Right,Dorsal Foot o Other: - triamcenalone 0.1% Secondary Dressing Wound #3 Right,Circumferential Lower Leg o  Conform/Kerlix Wound #4 Right,Dorsal Foot o Conform/Kerlix Dressing Change Frequency Wound #3 Right,Circumferential Lower Leg o Change dressing every day. Wound #4 Right,Dorsal Foot o Change dressing every day. Follow-up Appointments Wound #3 Right,Circumferential Lower Leg o Return Appointment in 1 week. Wound #4 Right,Dorsal Foot o Return Appointment in 1 week. MALACAI, GRANTZ ([id number]) Electronic Signature(s) Signed: 06/15/2015 4:33:50 PM By: Meyer Russel  Glory Buff MD, FACS Signed: 06/15/2015 5:44:17 PM By: Elpidio Eric BSN, RN Entered By: Elpidio Eric on 06/15/2015 14:28:43 Jordan [name] ([id number]) -------------------------------------------------------------------------------- Problem List Details Patient Name: Jordan [name] Date of Service: 06/15/2015 1:30 PM Medical Record Number: [id number] Patient Account Number: [id number] Date of Birth/Sex: 01-28-1941 (75 y.o. 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/15/2015 Yes breakdown of skin B35.9 Dermatophytosis, unspecified 06/15/2015 Yes I70.232 Atherosclerosis of native arteries of right leg with 06/15/2015 Yes ulceration of calf Z89.512 Acquired absence of left leg below knee 06/15/2015 Yes Inactive Problems Resolved Problems Electronic Signature(s) Signed: 06/15/2015 2:54:21 PM By: Evlyn Kanner MD, FACS Entered By: Evlyn Kanner on 06/15/2015 14:54:21 Jordan [name] ([id number]) -------------------------------------------------------------------------------- Progress Note Details Patient Name: Jordan [name] Date of Service: 06/15/2015 1:30 PM Medical Record  Number: [id number] Patient Account Number: [id number] Date of Birth/Sex: 1940-07-06 (75 y.o. Male) Treating RN: Clover Mealy, RN, BSN, Rita Primary Care Physician: [name] Other Clinician: Referring Physician: [name] Treating Physician/Extender: Rudene Re in Treatment: 0 Subjective Chief Complaint Information obtained from Patient Patient presents to the wound care center for a consult due non healing wound to the right lower extremity which she's had for about 3 months now. History of Present Illness (HPI) The following HPI elements were documented for the patient's wound: Location: right lower extremity Quality: Patient reports experiencing a dull pain to affected area(s). Severity: Patient states wound are getting worse. Duration: Patient has had the wound for > 3 months prior to seeking treatment at the wound center Timing: Pain in wound is Intermittent (comes and goes Context: The wound would happen gradually Modifying Factors: Other treatment(s) tried include: the patient was seen by his PCP who put the patient on clindamycin, dressed the wound with Silvadene and applied a 4-layer Profore compression Associated Signs and Symptoms: Patient reports having:redness to the right lower extremity. Very pleasant 75 year old gentleman who comes along with his wife for a wound care consultation having been sent to [place] by his PCP. he comes for an unrelated problem but had been seen by as about a year ago as a one-time consult and the patient had followed up with his primary surgeon Dr. Lajoyce Corners. 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 He has had most of his surgeries and procedures done at the [facility] in [place] and of note on 03/24/2014 he  was seen by Dr. 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 Prior to that his surgeon was Dr. Lajoyce Corners and Dr. Lajoyce Corners has been some procedures on his left foot for several months during the latter part of 2015. Dr. Edilia Bo is the patient's vascular surgeon and on 06/13/2014 Dr. Edilia Bo saw him for a non-healing ulcer on the right heel and did a aortogram and bilateral iliac arteriogram. 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 He also had  a stenosis of the tibioperoneal trunk. No angioplasty was done at this stage and Dr. Edilia Bo was of the opinion that if his wound heals well then he would avoid surgery. However if his heel gets worse a PTA of the tibioperoneal trunk would be done and/ or a vein angioplasty. patient's diabetes is better controlled and his last Hemoglobin A1c was 8. has also been put on Bactrim for the last 2 weeks by his primary care doctor. He was last seen by Dr. [name] on 01/11/2015, and the arterial Doppler study done shows Jordan [name], Jordan [name] ([id number]) monophasic Doppler signals in the right dorsalis pedis and posterior tibial. ABI on the right is 0.86 and TBI was 78 mmHg or 0.56 Dr. Durwin Nora s opinion was at this stage he would not recommend any surgical intervention and if there was significant wounds which would not heal. The patient is a former smoker quit in November 2012. Most recently he was seen at his PCPs office on 06/08/2015 for an ulcer of the right shin which had been treated with Silvadene and oral clindamycin. Past medical history is significant for diabetes mellitus, hypertension, coronary artery disease, CHF, MI, peripheral neuropathy, neuropathy, status post appendectomy, cardiac stents, amputation of left toes and amputation below  knee on 03/24/2014. Wound History Patient presents with 1 open wound that has been present for approximately 2 month. Patient has been treating wound in the following manner: dry dressing. Laboratory tests have not been performed in the last month. Patient reportedly has not tested positive for an antibiotic resistant organism. Patient reportedly has not tested positive for osteomyelitis. Patient reportedly has had testing performed to evaluate circulation in the legs. Patient History Information obtained from Patient. Allergies no allergies Family History Cancer - Siblings, Father, Diabetes - Mother, Siblings, Heart Disease - Mother, Hypertension - Mother, Kidney Disease - Mother, No family history of Hereditary Spherocytosis, Lung Disease, Seizures, Stroke, Thyroid Problems, Tuberculosis. Social History Former smoker, Marital Status - Married, Alcohol Use - Never, Drug Use - No History, Caffeine Use - Daily. Medical History Eyes Patient has history of Cataracts - 2years ago Endocrine Patient has history of Type II Diabetes Patient is treated with Insulin, Oral Agents. Blood sugar is tested. Blood sugar results noted at the following times: Breakfast - 130. 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 Ear/Nose/Mouth/Throat The patient has no complaints or symptoms. Hematologic/Lymphatic The patient has no complaints or symptoms. Respiratory The patient has no complaints or symptoms. Cardiovascular The patient has no complaints or symptoms. Gastrointestinal The patient has no complaints or symptoms. Endocrine The patient has no complaints or  symptoms. Immunological The patient has no complaints or symptoms. Integumentary (Skin) Complains or has symptoms of Wounds, Breakdown, Swelling. Musculoskeletal The patient has no complaints or symptoms. Neurologic The patient has no complaints or symptoms. Oncologic The patient has no complaints or symptoms. Medications aspirin 81 mg tablet,delayed release oral 1 1 tablet,delayed release (DR/EC) oral daily acetaminophen 325 mg tablet oral tablet oral as directed gabapentin 300 mg capsule oral 1 1 capsule oral three times daily as needed metformin 1,000 mg tablet oral 1 1 tablet oral two times daily with a meal Crestor 40 mg tablet oral 1 1 tablet oral daily losartan 100 mg tablet oral 1 1 tablet oral daily metoprolol  succinate ER 100 mg tablet,extended release 24 hr oral 2 2 tablet extended release 24 hr oral daily amlodipine 10 mg tablet oral 1 1 tablet oral daily cinnamon bark 500 mg capsule oral 1 1 capsule oral daily Lantus 100 unit/mL subcutaneous solution subcutaneous solution subcutaneous 29 units two times daily Novolog 100 unit/mL subcutaneous solution subcutaneous inject three times daily as needed per sliding scale docusate sodium 100 mg capsule oral 1 1 capsule oral two times daily clindamycin 300 mg capsule oral 1 1 capsule oral four times daily for 10 days furosemide 20 mg tablet oral one to two tablets oral daily as needed escitalopram 20 mg tablet oral 1 1 tablet oral daily isosorbide mononitrate ER 30 mg tablet,extended release 24 hr oral 1 1 tablet extended release 24 hr oral daily nitroglycerin 0.4 mg sublingual tablet sublingual 1 1 tablet, sublingual sublingual every five minutes as Jordan [name] ([id number]) needed Objective Constitutional Pulse regular. Respirations normal and unlabored. Afebrile. Vitals Time Taken: 1:41 PM, Height: 69 in, Source: Stated, Weight: 243 lbs, Source: Stated, BMI: 35.9, Temperature: 97.5 F, Pulse: 53 bpm, Respiratory Rate:  17 breaths/min, Blood Pressure: 124/52 mmHg. Eyes Nonicteric. Reactive to light. Ears, Nose, Mouth, and Throat Lips, teeth, and gums WNL.[name] Moist mucosa without lesions. Neck supple and nontender. No palpable supraclavicular or cervical adenopathy. Normal sized without goiter. Respiratory WNL. No retractions.. Cardiovascular Pedal Pulses WNL. ABI on the right was 0.93. No clubbing, cyanosis or edema. Gastrointestinal (GI) Abdomen without masses or tenderness.. No liver or spleen enlargement or tenderness.. Lymphatic No adneopathy. No adenopathy. No adenopathy. Musculoskeletal Adexa without tenderness or enlargement.. Digits and nails w/o clubbing, cyanosis, infection, petechiae, ischemia, or inflammatory conditions.[name] Psychiatric Judgement and insight Intact.. No evidence of depression, anxiety, or agitation.. General Notes: on the right lower third of his leg and on his foot he has got shiny pinkish skin which is very much like a fungal infection. He has few micro-ulcerations but no evidence of cross cellulitis. He has also had fungal infection of his nails. His proximal leg does not show any evidence of lymphedema or cellulitis. Integumentary (Hair, Skin) No suspicious lesions. No crepitus or fluctuance. No peri-wound warmth or erythema. No masses.[name] Jordan [name], Jordan [name] ([id number]) Wound #3 status is Open. Original cause of wound was Gradually Appeared. The wound is located on the Right,Circumferential Lower Leg. The wound measures 10cm length x 21.5cm width x 0.1cm depth; 168.861cm^2 area and 16.886cm^3 volume. The wound is limited to skin breakdown. There is no tunneling or undermining noted. There is a small amount of serous drainage noted. The wound margin is distinct with the outline attached to the wound base. There is no necrotic tissue within the wound bed. The periwound skin appearance exhibited: Moist, Erythema. The periwound skin appearance did not exhibit: Callus, Crepitus,  Excoriation, Fluctuance, Friable, Induration, Localized Edema, Rash, Scarring, Dry/Scaly, Maceration, Atrophie Blanche, Cyanosis, Ecchymosis, Hemosiderin Staining, Mottled, Pallor, Rubor. The surrounding wound skin color is noted with erythema which is circumferential. Periwound temperature was noted as No Abnormality. Wound #4 status is Open. Original cause of wound was Gradually Appeared. The wound is located on the Right,Dorsal Foot. The wound measures 7.5cm length x 7.5cm width x 0.1cm depth; 44.179cm^2 area and 4.418cm^3 volume. The wound is limited to skin breakdown. There is no tunneling or undermining noted. There is a small amount of drainage noted. The wound margin is distinct with the outline attached to the wound base. There is medium (34-66%) granulation within the wound bed. There is a small (  1-33%) amount of necrotic tissue within the wound bed. The periwound skin appearance exhibited: Localized Edema, Moist. The periwound skin appearance did not exhibit: Callus, Crepitus, Excoriation, Fluctuance, Friable, Induration, Rash, Scarring, Dry/Scaly, Maceration, Atrophie Blanche, Cyanosis, Ecchymosis, Hemosiderin Staining, Mottled, Pallor, Rubor, Erythema. Periwound temperature was noted as No Abnormality. 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 His diabetes is fairly well controlled and I believe at this time he has a significant fungal infection of his lower extremity which has led to certain amount of micro-perforations in the skin. I  have asked him to discontinue the application of 4 layer Profore wrap and have recommended Lotrisone cream to be applied daily with a light Kerlix bandage. Jordan [name], Jordan [name] ([id number]) Come back for review and will be seeing his vascular surgeon in early April. Ration and his wife have all questions answered and will be compliant. Plan Wound Cleansing: Wound #3 Right,Circumferential Lower Leg: Cleanse wound with mild soap and water Wound #4 Right,Dorsal Foot: Cleanse wound with mild soap and water Skin Barriers/Peri-Wound Care: Wound #3 Right,Circumferential Lower Leg: Other: - triamcenalone 0.1% Wound #4 Right,Dorsal Foot: Other: - triamcenalone 0.1% Secondary Dressing: Wound #3 Right,Circumferential Lower Leg: Conform/Kerlix Wound #4 Right,Dorsal Foot: Conform/Kerlix Dressing Change Frequency: Wound #3 Right,Circumferential Lower Leg: Change dressing every day. Wound #4 Right,Dorsal Foot: Change dressing every day. Follow-up Appointments: Wound #3 Right,Circumferential Lower Leg: Return Appointment in 1 week. Wound #4 Right,Dorsal Foot: Return Appointment in 1 week. 75 year old diabetic patient who I believe has had appropriate vascular intervention and correction of his atherosclerotic disease of the lower limb. His diabetes is fairly well controlled and I believe at this time he has a significant fungal infection of his lower extremity which has led to certain amount of micro-perforations in the skin. I have asked him to discontinue the application of 4 layer Profore wrap and have recommended Lotrisone cream to be applied daily with a light Kerlix bandage. Come back for review and will be seeing his vascular surgeon in early April. Ration and his wife have all questions answered and will be compliant. Jordan [name], Jordan [name] ([id number]) Electronic Signature(s) Signed: 06/15/2015 3:00:56 PM By: Evlyn Kanner MD, FACS Entered By: Evlyn Kanner on 06/15/2015 15:00:56 Jordan [name]  ([id number]) -------------------------------------------------------------------------------- ROS/PFSH Details Patient Name: Jordan [name] Date of Service: 06/15/2015 1:30 PM Medical Record Number: [id number] Patient Account Number: [id number] Date of Birth/Sex: 06-07-1940 (75 y.o. Male) Treating RN: Clover Mealy, RN, BSN, Rita Primary Care Physician: [name] Other Clinician: Referring Physician: [name] Treating Physician/Extender: Rudene Re in Treatment: 0 Information Obtained From Patient Wound History Do you currently have one or more open woundso Yes How many open wounds do you currently haveo 1 Approximately how long have you had your woundso 2 month How have you been treating your wound(s) until nowo dry dressing Has your wound(s) ever healed and then re-openedo No Have you had any lab work done in the past montho No Have you tested positive for an antibiotic resistant organism (MRSA, VRE)o No Have you tested positive for osteomyelitis (bone infection)o No Have you had any tests for circulation on your legso Yes Eyes  Complaints and Symptoms: Positive for: Vision Changes; Glasses / Contacts Medical History: Positive for: Cataracts - 2years ago Negative for: Glaucoma; Optic Neuritis Integumentary (Skin) Complaints and Symptoms: Positive for: Wounds; Breakdown; Swelling Medical History: Negative for: History of Burn; History of pressure wounds Ear/Nose/Mouth/Throat Complaints and Symptoms: No Complaints or Symptoms Medical History: Negative for: Chronic sinus problems/congestion; Middle ear problems Hematologic/Lymphatic Complaints and Symptoms: No Complaints or Symptoms Jordan [name], Jordan [name] ([id number]) Medical History: Positive for: Anemia Negative for: Hemophilia; Human Immunodeficiency Virus; Lymphedema; Sickle Cell Disease Respiratory Complaints and Symptoms: No Complaints or Symptoms Medical History: Negative for: Aspiration; Asthma; Chronic  Obstructive Pulmonary Disease (COPD); Pneumothorax; Sleep Apnea; Tuberculosis Cardiovascular Complaints and Symptoms: No Complaints or Symptoms Medical History: Positive for: Congestive Heart Failure; Coronary Artery Disease; Hypertension; Peripheral Venous Disease Past Medical History Notes: aortic valve stenosis hyperlipedemia, 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, postsurgical percutaneous transluminal coronary angioplasty status Gastrointestinal Complaints and Symptoms: No Complaints or Symptoms Medical History: Negative for: Cirrhosis ; Colitis; Crohnos; Hepatitis A; Hepatitis B; Hepatitis C Endocrine Complaints and Symptoms: No Complaints or Symptoms Medical History: Positive for: Type II Diabetes Time with diabetes: 3539yrs Treated with: Insulin, Oral agents Blood sugar tested every day: Yes Tested : Blood sugar testing results: Breakfast: [address] Genitourinary Jordan [name], Jordan [name] ([id number]) Medical History: Negative for: End Stage Renal Disease Immunological Complaints and Symptoms: No Complaints or Symptoms Medical History: Negative for: Lupus Erythematosus; Raynaudos; Scleroderma Musculoskeletal Complaints and Symptoms: No Complaints or Symptoms Medical History: Positive for: Osteomyelitis Negative for: Gout; Rheumatoid Arthritis; Osteoarthritis Neurologic Complaints and Symptoms: No Complaints or Symptoms Medical History: Positive for: Neuropathy - diabetic Negative for: Dementia; Quadriplegia; Paraplegia; Seizure Disorder Oncologic Complaints and Symptoms: No Complaints or Symptoms Medical History: Negative for: Received Chemotherapy; Received Radiation Psychiatric Medical History: Past Medical History Notes: Depression HBO Extended History Items Eyes: Cataracts Immunizations Immunization Notes: patient does not remember Jordan [name], Jordan [name] ([id number]) Family and Social History Cancer: Yes - Siblings, Father; Diabetes:  Yes - Mother, Siblings; Heart Disease: Yes - Mother; Hereditary Spherocytosis: No; Hypertension: Yes - Mother; Kidney Disease: Yes - Mother; Lung Disease: No; Seizures: No; Stroke: No; Thyroid Problems: No; Tuberculosis: No; Former smoker; Marital Status - Married; Alcohol Use: Never; Drug Use: No History; Caffeine Use: Daily; Financial Concerns: No; Food, Clothing or Shelter Needs: No; Support System Lacking: No; Transportation Concerns: No; Advanced Directives: No; Patient does not want information on Advanced Directives Physician Affirmation I have reviewed and agree with the above information. Electronic Signature(s) Signed: 06/15/2015 2:59:12 PM By: Evlyn KannerBritto, Mahki Spikes MD, FACS Signed: 06/15/2015 5:44:17 PM By: Elpidio EricAfful, Rita BSN, RN Entered By: Evlyn KannerBritto, Sharie Amorin on 06/15/2015 14:59:10 Jordan [name], Jordan [name] ([id number]) -------------------------------------------------------------------------------- SuperBill Details Patient Name: Jordan [name], Laurance Date of Service: 06/15/2015 Medical Record Number: [id number] Patient Account Number: [id number] Date of Birth/Sex: 1941-01-06 43(74 y.o. 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 Facility Procedures CPT4 Code: [id number] Description: 99214 - WOUND CARE VISIT-LEV 4 EST PT Modifier: Quantity: 1 Physician Procedures CPT4 Code Description: 25366446770424 99214 - WC PHYS LEVEL 4 - EST PT ICD-10 Description Diagnosis E11.622 Type 2 diabetes mellitus with other skin ulcer L97.211  Non-pressure chronic ulcer of right calf limited to br B35.9 Dermatophytosis, unspecified I70.232  Atherosclerosis of native arteries of right leg with u Modifier: eakdown of skin lceration of ca Quantity: 1 lf Electronic Signature(s) Signed: 06/15/2015 3:01:19 PM By: Evlyn Kanner MD, FACS Entered By: Evlyn Kanner on 06/15/2015 15:01:18

## 2015-06-22 ENCOUNTER — Encounter: Payer: Medicare Other | Admitting: Surgery

## 2015-06-22 DIAGNOSIS — E11622 Type 2 diabetes mellitus with other skin ulcer: Secondary | ICD-10-CM | POA: Diagnosis not present

## 2015-06-24 NOTE — Progress Notes (Signed)
Jordan [name], Jordan [name] ([id number]) Visit Report for 06/22/2015 Arrival Information Details Patient Name: Jordan [name] Date of Service: 06/22/2015 11:00 AM Medical Record Number: [id number] Patient Account Number: [id number] Date of Birth/Sex: [birth year] (75 y.o. Male) Treating RN: Curtis Sites Primary Care Physician: [name] Other Clinician: Referring Physician: [name] Treating Physician/Extender: Rudene Re in Treatment: 1 Visit Information History Since Last Visit Added or deleted any medications: No Patient Arrived: [place] Any new allergies or adverse reactions: No Arrival Time: 10:56 Had a fall or experienced change in No Accompanied By: spouse activities of daily living that may affect Transfer Assistance: None risk of falls: Patient Identification Verified: Yes Signs or symptoms of abuse/neglect since last No Secondary Verification Process Completed: Yes visito Patient Requires Transmission-Based No Hospitalized since last visit: No Precautions: Pain Present Now: No Patient Has Alerts: No Electronic Signature(s) Signed: 06/22/2015 5:19:20 PM By: Curtis Sites Entered By: Curtis Sites on 06/22/2015 10:56:45 Jordan [name] ([id number]) -------------------------------------------------------------------------------- Clinic Level of Care Assessment Details Patient Name: Jordan [name] Date of Service: 06/22/2015 11:00 AM Medical Record Number: [id number] Patient Account Number: [id number] Date of Birth/Sex: Aug 04, 1940 (75 y.o. Male) Treating RN: Curtis Sites Primary Care Physician: [name] Other Clinician: Referring Physician: [name] Treating Physician/Extender: Rudene Re in Treatment: 1 Clinic Level of Care Assessment Items TOOL 4 Quantity Score  - Use when only an EandM is performed on FOLLOW-UP visit 0 ASSESSMENTS - Nursing Assessment / Reassessment X - Reassessment of Co-morbidities (includes updates in patient status)  1 10 X - Reassessment of Adherence to Treatment Plan 1 5 ASSESSMENTS - Wound and Skin Assessment / Reassessment  - Simple Wound Assessment / Reassessment - one wound 0 X - Complex Wound Assessment / Reassessment - multiple wounds 2 5  - Dermatologic / Skin Assessment (not related to wound area) 0 ASSESSMENTS - Focused Assessment X - Circumferential Edema Measurements - multi extremities 1 5  - Nutritional Assessment / Counseling / Intervention 0 X - Lower Extremity Assessment (monofilament, tuning fork, pulses) 1 5  - Peripheral Arterial Disease Assessment (using hand held doppler) 0 ASSESSMENTS - Ostomy and/or Continence Assessment and Care  - Incontinence Assessment and Management 0  - Ostomy Care Assessment and Management (repouching, etc.) 0 PROCESS - Coordination of Care X - Simple Patient / Family Education for ongoing care 1 15  - Complex (extensive) Patient / Family Education for ongoing care 0  - Staff obtains Chiropractor, Records, Test Results / Process Orders 0  - Staff telephones HHA, Nursing Homes / Clarify orders / etc 0  - Routine Transfer to another Facility (non-emergent condition) 0 Jordan [name], Jordan [name] ([id number])  - Routine Hospital Admission (non-emergent condition) 0  - New Admissions / Manufacturing engineer / Ordering NPWT, Apligraf, etc. 0  - Emergency Hospital Admission (emergent condition) 0 X - Simple Discharge Coordination 1 10  - Complex (extensive) Discharge Coordination 0 PROCESS - Special Needs  - Pediatric / Minor Patient Management 0  - Isolation Patient Management 0  - Hearing / Language / Visual special needs 0  - Assessment of Community assistance (transportation, D/C planning, etc.) 0  - Additional assistance / Altered mentation 0  - Support Surface(s) Assessment (bed, cushion, seat, etc.) 0 INTERVENTIONS - Wound Cleansing / Measurement  - Simple Wound Cleansing - one wound 0 X - Complex Wound Cleansing -  multiple wounds 2 5 X - Wound Imaging (photographs - any number of wounds) 1 5  - Wound Tracing (instead of photographs) 0  - Simple Wound Measurement -  one wound 0 X - Complex Wound Measurement - multiple wounds 2 5 INTERVENTIONS - Wound Dressings X - Small Wound Dressing one or multiple wounds 2 10 []  - Medium Wound Dressing one or multiple wounds 0 []  - Large Wound Dressing one or multiple wounds 0 X - Application of Medications - topical 1 5 []  - Application of Medications - injection 0 INTERVENTIONS - Miscellaneous []  - External ear exam 0 Jordan [name], Jordan [name] ([id number]) []  - Specimen Collection (cultures, biopsies, blood, body fluids, etc.) 0 []  - Specimen(s) / Culture(s) sent or taken to Lab for analysis 0 []  - Patient Transfer (multiple staff / Michiel Sites Lift / Similar devices) 0 []  - Simple Staple / Suture removal (25 or less) 0 []  - Complex Staple / Suture removal (26 or more) 0 []  - Hypo / Hyperglycemic Management (close monitor of Blood Glucose) 0 []  - Ankle / Brachial Index (ABI) - do not check if billed separately 0 X - Vital Signs 1 5 Has the patient been seen at the hospital within the last three years: Yes Total Score: 115 Level Of Care: New/Established - Level 3 Electronic Signature(s) Signed: 06/22/2015 12:55:52 PM By: Curtis Sites Entered By: Curtis Sites on 06/22/2015 12:55:51 Jordan [name] ([id number]) -------------------------------------------------------------------------------- Encounter Discharge Information Details Patient Name: Jordan [name] Date of Service: 06/22/2015 11:00 AM Medical Record Number: [id number] Patient Account Number: [id number] Date of Birth/Sex: 31-Oct-1940 (75 y.o. Male) Treating RN: Curtis Sites Primary Care Physician: [name] Other Clinician: Referring Physician: [name] Treating Physician/Extender: Rudene Re in Treatment: 1 Encounter Discharge Information Items Discharge Pain Level: 0 Discharge  Condition: Stable Ambulatory Status: Cane Discharge Destination: Home Transportation: Private Auto Accompanied By: wife Schedule Follow-up Appointment: Yes Medication Reconciliation completed Yes and provided to Patient/Care Carston Riedl: Provided on Clinical Summary of Care: 06/22/2015 Form Type Recipient Paper Patient TF Electronic Signature(s) Signed: 06/22/2015 11:23:09 AM By: Gwenlyn Perking Entered By: Gwenlyn Perking on 06/22/2015 11:23:09 Jordan [name] ([id number]) -------------------------------------------------------------------------------- Lower Extremity Assessment Details Patient Name: Jordan [name] Date of Service: 06/22/2015 11:00 AM Medical Record Number: [id number] Patient Account Number: [id number] Date of Birth/Sex: 01-Mar-1941 (75 y.o. Male) Treating RN: Curtis Sites Primary Care Physician: [name] Other Clinician: Referring Physician: [name] Treating Physician/Extender: Rudene Re in Treatment: 1 Edema Assessment Assessed: [Left: No] [Right: No] E[Left: dema] [Right: :] Calf Left: Right: Point of Measurement: cm From Medial Instep cm 40.5 cm Ankle Left: Right: Point of Measurement: cm From Medial Instep cm 25 cm Vascular Assessment Pulses: Posterior Tibial Dorsalis Pedis Palpable: [Right:Yes] Extremity colors, hair growth, and conditions: Extremity Color: [Right:Hyperpigmented] Temperature of Extremity: [Right:Warm] Capillary Refill: [Right:< 3 seconds] Electronic Signature(s) Signed: 06/22/2015 5:19:20 PM By: Curtis Sites Entered By: Curtis Sites on 06/22/2015 11:08:10 Jordan [name] ([id number]) -------------------------------------------------------------------------------- Multi Wound Chart Details Patient Name: Jordan [name] Date of Service: 06/22/2015 11:00 AM Medical Record Number: [id number] Patient Account Number: [id number] Date of Birth/Sex: 1940-07-21 (75 y.o. Male) Treating RN: Curtis Sites Primary Care  Physician: [name] Other Clinician: Referring Physician: [name] Treating Physician/Extender: Rudene Re in Treatment: 1 Vital Signs Height(in): 69 Pulse(bpm): 56 Weight(lbs): 243 Blood Pressure 117/47 (mmHg): Body Mass Index(BMI): 36 Temperature(F): 97.8 Respiratory Rate 18 (breaths/min): Photos: [3:No Photos] [4:No Photos] [N/A:N/A] Wound Location: [3:Right Lower Leg - Circumfernential] [4:Right Foot - Dorsal] [N/A:N/A] Wounding Event: [3:Gradually Appeared] [4:Gradually Appeared] [N/A:N/A] Primary Etiology: [3:Cellulitis] [4:Cellulitis] [N/A:N/A] Comorbid History: [3:Cataracts, Anemia, Congestive Heart Failure, Coronary Artery Disease, Hypertension, Peripheral Venous Disease, Type II Diabetes, Osteomyelitis, Neuropathy] [4:Cataracts, Anemia, Congestive Heart Failure,  Coronary Artery Disease,  Hypertension, Peripheral Venous Disease, Type II Diabetes, Osteomyelitis, Neuropathy] [N/A:N/A] Date Acquired: [3:04/11/2015] [4:04/04/2015] [N/A:N/A] Weeks of Treatment: [3:1] [4:1] [N/A:N/A] Wound Status: [3:Open] [4:Open] [N/A:N/A] Measurements L x W x D 0.1x0.1x0.1 [4:1.3x1x0.1] [N/A:N/A] (cm) Area (cm) : [3:0.008] [4:1.021] [N/A:N/A] Volume (cm) : [3:0.001] [4:0.102] [N/A:N/A] % Reduction in Area: [3:100.00%] [4:97.70%] [N/A:N/A] % Reduction in Volume: 100.00% [4:97.70%] [N/A:N/A] Classification: [3:Partial Thickness] [4:Partial Thickness] [N/A:N/A] HBO Classification: [3:Grade 0] [4:Grade 0] [N/A:N/A] Exudate Amount: [3:Small] [4:Small] [N/A:N/A] Exudate Type: [3:Serous] [4:N/A] [N/A:N/A] Exudate Color: [3:amber] [4:N/A] [N/A:N/A] Wound Margin: [3:Distinct, outline attached] [4:Distinct, outline attached] [N/A:N/A] Granulation Amount: [3:Large (67-100%)] [4:Large (67-100%)] [N/A:N/A] Granulation Quality: [3:Red, Pink] [4:Red, Pink] [N/A:N/A] Necrotic Amount: [3:None Present (0%)] [4:None Present (0%)] [N/A:N/A] Exposed Structures: Fascia: No Fascia: No  N/A Fat: No Fat: No Tendon: No Tendon: No Muscle: No Muscle: No Joint: No Joint: No Bone: No Bone: No Limited to Skin Limited to Skin Breakdown Breakdown Epithelialization: None None N/A Periwound Skin Texture: Edema: No Edema: Yes N/A Excoriation: No Excoriation: No Induration: No Induration: No Callus: No Callus: No Crepitus: No Crepitus: No Fluctuance: No Fluctuance: No Friable: No Friable: No Rash: No Rash: No Scarring: No Scarring: No Periwound Skin Moist: Yes Maceration: No N/A Moisture: Maceration: No Moist: No Dry/Scaly: No Dry/Scaly: No Periwound Skin Color: Erythema: Yes Atrophie Blanche: No N/A Atrophie Blanche: No Cyanosis: No Cyanosis: No Ecchymosis: No Ecchymosis: No Erythema: No Hemosiderin Staining: No Hemosiderin Staining: No Mottled: No Mottled: No Pallor: No Pallor: No Rubor: No Rubor: No Erythema Location: Circumferential N/A N/A Temperature: No Abnormality No Abnormality N/A Tenderness on No No N/A Palpation: Wound Preparation: Ulcer Cleansing: Other: Ulcer Cleansing: Other: N/A soap and water soap and water Topical Anesthetic Topical Anesthetic Applied: None Applied: None Treatment Notes Electronic Signature(s) Signed: 06/22/2015 5:19:20 PM By: Curtis Sites Entered By: Curtis Sites on 06/22/2015 11:11:39 Jordan [name] ([id number]) -------------------------------------------------------------------------------- Multi-Disciplinary Care Plan Details Patient Name: Jordan [name] Date of Service: 06/22/2015 11:00 AM Medical Record Number: [id number] Patient Account Number: [id number] Date of Birth/Sex: May 10, 1940 (75 y.o. Male) Treating RN: Curtis Sites Primary Care Physician: [name] Other Clinician: Referring Physician: [name] Treating Physician/Extender: Rudene Re in Treatment: 1 Active Inactive Electronic Signature(s) Signed: 06/22/2015 5:19:20 PM By: Curtis Sites Entered By: Curtis Sites on 06/22/2015 11:11:26 Jordan [name] ([id number]) -------------------------------------------------------------------------------- Patient/Caregiver Education Details Patient Name: Jordan [name] Date of Service: 06/22/2015 11:00 AM Medical Record Number: [id number] Patient Account Number: [id number] Date of Birth/Gender: Dec 12, 1940 (75 y.o. Male) Treating RN: Curtis Sites Primary Care Physician: [name] Other Clinician: Referring Physician: [name] Treating Physician/Extender: Rudene Re in Treatment: 1 Education Assessment Education Provided To: Patient Education Topics Provided Wound/Skin Impairment: Handouts: Other: change dressing as ordered Methods: Demonstration, Explain/Verbal Responses: State content correctly Electronic Signature(s) Signed: 06/22/2015 5:19:20 PM By: Curtis Sites Entered By: Curtis Sites on 06/22/2015 11:18:54 Jordan [name] ([id number]) -------------------------------------------------------------------------------- Wound Assessment Details Patient Name: Jordan [name] Date of Service: 06/22/2015 11:00 AM Medical Record Number: [id number] Patient Account Number: [id number] Date of Birth/Sex: [birth year] (75 y.o. Male) Treating RN: Curtis Sites Primary Care Physician: [name] Other Clinician: Referring Physician: [name] Treating Physician/Extender: Rudene Re in Treatment: 1 Wound Status Wound Number: 3 Primary Cellulitis Etiology: Wound Location: Right Lower Leg - Circumfernential Wound Open Status: Wounding Event: Gradually Appeared Comorbid Cataracts, Anemia, Congestive Heart Date Acquired: 04/11/2015 History: Failure, Coronary Artery Disease, Weeks Of Treatment: 1 Hypertension, Peripheral Venous Clustered Wound: No Disease, Type II Diabetes, Osteomyelitis, Neuropathy Photos Photo Uploaded By:  Curtis Sites on 06/22/2015 17:06:32 Wound Measurements Length: (cm) 0.1 Width: (cm)  0.1 Depth: (cm) 0.1 Area: (cm) 0.008 Volume: (cm) 0.001 % Reduction in Area: 100% % Reduction in Volume: 100% Epithelialization: None Wound Description Classification: Partial Thickness Foul O Diabetic Severity (Wagner): Grade 0 Wound Margin: Distinct, outline attached Exudate Amount: Small Exudate Type: Serous Exudate Color: amber dor After Cleansing: No Wound Bed Granulation Amount: Large (67-100%) Exposed Structure Jordan [name], Jordan [name] ([id number]) Granulation Quality: Red, Pink Fascia Exposed: No Necrotic Amount: None Present (0%) Fat Layer Exposed: No Tendon Exposed: No Muscle Exposed: No Joint Exposed: No Bone Exposed: No Limited to Skin Breakdown Periwound Skin Texture Texture Color No Abnormalities Noted: No No Abnormalities Noted: No Callus: No Atrophie Blanche: No Crepitus: No Cyanosis: No Excoriation: No Ecchymosis: No Fluctuance: No Erythema: Yes Friable: No Erythema Location: Circumferential Induration: No Hemosiderin Staining: No Localized Edema: No Mottled: No Rash: No Pallor: No Scarring: No Rubor: No Moisture Temperature / Pain No Abnormalities Noted: No Temperature: No Abnormality Dry / Scaly: No Maceration: No Moist: Yes Wound Preparation Ulcer Cleansing: Other: soap and water, Topical Anesthetic Applied: None Electronic Signature(s) Signed: 06/22/2015 5:19:20 PM By: Curtis Sites Entered By: Curtis Sites on 06/22/2015 11:06:53 Jordan [name] ([id number]) -------------------------------------------------------------------------------- Wound Assessment Details Patient Name: Jordan [name] Date of Service: 06/22/2015 11:00 AM Medical Record Number: [id number] Patient Account Number: [id number] Date of Birth/Sex: 15-Mar-1941 (75 y.o. Male) Treating RN: Curtis Sites Primary Care Physician: [name] Other Clinician: Referring Physician: [name] Treating Physician/Extender: Rudene Re in Treatment: 1 Wound  Status Wound Number: 4 Primary Cellulitis Etiology: Wound Location: Right Foot - Dorsal Wound Open Wounding Event: Gradually Appeared Status: Date Acquired: 04/04/2015 Comorbid Cataracts, Anemia, Congestive Heart Weeks Of Treatment: 1 History: Failure, Coronary Artery Disease, Clustered Wound: No Hypertension, Peripheral Venous Disease, Type II Diabetes, Osteomyelitis, Neuropathy Photos Photo Uploaded By: Curtis Sites on 06/22/2015 17:06:32 Wound Measurements Length: (cm) 1.3 Width: (cm) 1 Depth: (cm) 0.1 Area: (cm) 1.021 Volume: (cm) 0.102 % Reduction in Area: 97.7% % Reduction in Volume: 97.7% Epithelialization: None Tunneling: No Undermining: No Wound Description Classification: Partial Thickness Foul O Diabetic Severity (Wagner): Grade 0 Wound Margin: Distinct, outline attached Exudate Amount: Small dor After Cleansing: No Wound Bed Granulation Amount: Large (67-100%) Exposed Structure Granulation Quality: Red, Pink Fascia Exposed: No Necrotic Amount: None Present (0%) Fat Layer Exposed: No Jordan [name], Jordan [name] ([id number]) Tendon Exposed: No Muscle Exposed: No Joint Exposed: No Bone Exposed: No Limited to Skin Breakdown Periwound Skin Texture Texture Color No Abnormalities Noted: No No Abnormalities Noted: No Callus: No Atrophie Blanche: No Crepitus: No Cyanosis: No Excoriation: No Ecchymosis: No Fluctuance: No Erythema: No Friable: No Hemosiderin Staining: No Induration: No Mottled: No Localized Edema: Yes Pallor: No Rash: No Rubor: No Scarring: No Temperature / Pain Moisture Temperature: No Abnormality No Abnormalities Noted: No Dry / Scaly: No Maceration: No Moist: No Wound Preparation Ulcer Cleansing: Other: soap and water, Topical Anesthetic Applied: None Electronic Signature(s) Signed: 06/22/2015 5:19:20 PM By: Curtis Sites Entered By: Curtis Sites on 06/22/2015 11:07:16 Jordan [name]  ([id number]) -------------------------------------------------------------------------------- Vitals Details Patient Name: Jordan [name] Date of Service: 06/22/2015 11:00 AM Medical Record Number: [id number] Patient Account Number: [id number] Date of Birth/Sex: [birth year] (75 y.o. Male) Treating RN: Curtis Sites Primary Care Physician: [name] Other Clinician: Referring Physician: [name] Treating Physician/Extender: Rudene Re in Treatment: 1 Vital Signs Time Taken: 10:58 Temperature (F): 97.8 Height (in): 69 Pulse (bpm): 56 Weight (lbs): 243 Respiratory Rate (breaths/min): 18 Body Mass Index (BMI):  35.9 Blood Pressure (mmHg): 117/47 Reference Range: 80 - 120 mg / dl Electronic Signature(s) Signed: 06/22/2015 5:19:20 PM By: Curtis Sitesorthy, Joanna Entered By: Curtis Sitesorthy, Joanna on 06/22/2015 10:59:03

## 2015-06-24 NOTE — Progress Notes (Addendum)
Jordan [name], Jordan ([id number]) Visit Report for 06/22/2015 Chief Complaint Document Details Patient Name: Jordan [name], Jordan Date of Service: 06/22/2015 11:00 AM Medical Record Number: [id number] Patient Account Number: [id number] Date of Birth/Sex: 1940-05-12 10(75 y.o. Male) Treating RN: Curtis Sitesorthy, Joanna Primary Care Physician: Lindwood QuaHOFFMAN, BYRON Other Clinician: Referring Physician: Lindwood QuaHOFFMAN, BYRON Treating Physician/Extender: Rudene ReBritto, Navarre Diana Weeks in Treatment: 1 Information Obtained from: Patient Chief Complaint Patient presents to the wound care center for a consult due non healing wound to the right lower extremity which she's had for about 3 months now. Electronic Signature(s) Signed: 06/22/2015 11:19:45 AM By: Evlyn KannerBritto, Maedell Hedger MD, FACS Entered By: Evlyn KannerBritto, Audreyanna Butkiewicz on 06/22/2015 11:19:44 Jordan [name], Jordan ([id number]) -------------------------------------------------------------------------------- HPI Details Patient Name: Jordan [name], Jordan Date of Service: 06/22/2015 11:00 AM Medical Record Number: [id number] Patient Account Number: [id number] Date of Birth/Sex: 1940-05-12 53(75 y.o. Male) Treating RN: Curtis Sitesorthy, Joanna Primary Care Physician: Lindwood QuaHOFFMAN, BYRON Other Clinician: Referring Physician: Lindwood QuaHOFFMAN, BYRON Treating Physician/Extender: Rudene ReBritto, Taras Rask Weeks in Treatment: 1 History of Present Illness Location: right lower extremity Quality: Patient reports experiencing a dull pain to affected area(s). Severity: Patient states wound are getting worse. Duration: Patient has had the wound for > 3 months prior to seeking treatment at the wound center Timing: Pain in wound is Intermittent (comes and goes Context: The wound would happen gradually Modifying Factors: Other treatment(s) tried include: the patient was seen by his PCP who put the patient on clindamycin, dressed the wound with Silvadene and applied a 4-layer Profore compression Associated Signs and Symptoms: Patient reports having:redness to the right  lower extremity. HPI Description: Very pleasant 75 year old gentleman who comes along with his wife for a wound care consultation having been sent to us by his PCP. he comes for an unrelated problem but had been seen by as about a year ago as a one-time consult and the patient had followed up with his primary surgeon Dr. Lajoyce Cornersuda. 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 He has [facility] most of [facility] [place] and of note on 03/24/2014 he was 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 Prior to that his surgeon was Dr. Lajoyce Cornersuda and Dr. Lajoyce Cornersuda has been some procedures on his left foot for several months during the latter part of 2015. Dr. Edilia Boickson is the patient's vascular surgeon and on 06/13/2014 Dr. Edilia Boickson saw him for a non-healing ulcer on the right heel and did a aortogram and bilateral iliac arteriogram. 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 He also had a stenosis of the tibioperoneal trunk. No angioplasty was done at this stage and Dr. Edilia Boickson was of the opinion that if his wound heals well then he would avoid surgery. However if his heel gets worse a PTA of the tibioperoneal trunk would be done and/ or a vein angioplasty. patient's diabetes is better controlled and his last Hemoglobin A1c was 8. has also been put on Bactrim for the last 2 weeks by his primary care doctor. He was last seen by Dr. Waverly Ferrarihristopher Dickson on 01/11/2015, and the arterial Doppler study done shows monophasic Doppler  signals in the right dorsalis pedis and posterior tibial. ABI on the right is 0.86 and TBI was 78 mmHg or 0.56 Dr. Enos Fling opinion was at this stage he would not recommend any surgical intervention and if there was significant wounds which would not heal. The patient is a former smoker quit in November 2012. Most recently he was seen at his PCPs office on 06/08/2015 for an ulcer of the right shin which had been treated with Silvadene and oral clindamycin. 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 Electronic Signature(s) Signed: 06/22/2015 11:20:04 AM By: Evlyn Kanner MD, FACS Entered By: Evlyn Kanner on 06/22/2015 11:20:04 Jordan [name] ([id number]) -------------------------------------------------------------------------------- Physical Exam Details Patient Name: Jordan [name] Date of Service: 06/22/2015 11:00 AM Medical Record Number: [id number] Patient Account Number: [id number] Date of Birth/Sex: 31-Oct-1940 (75 y.o. Male) Treating RN: Curtis Sites Primary Care Physician: [name] Other Clinician: Referring Physician: [name] Treating Physician/Extender: Rudene Re in Treatment: 1 Constitutional . Pulse regular. Respirations normal and unlabored. Afebrile. . Eyes Nonicteric. Reactive to light. Ears, Nose, Mouth, and Throat Lips, teeth, and gums WNL.[name] Moist mucosa without lesions. Neck supple and nontender. No palpable supraclavicular or cervical adenopathy. Normal sized without goiter. Respiratory WNL. No retractions.. Cardiovascular Pedal Pulses WNL. No clubbing, cyanosis or edema. Lymphatic No adneopathy. No adenopathy. No adenopathy. Musculoskeletal Adexa without tenderness or enlargement.. Digits and nails w/o  clubbing, cyanosis, infection, petechiae, ischemia, or inflammatory conditions.. Integumentary (Hair, Skin) No suspicious lesions. No crepitus or fluctuance. No peri-wound warmth or erythema. No masses.[name] Psychiatric Judgement and insight Intact.. No evidence of depression, anxiety, or agitation.. 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 Electronic Signature(s) Signed: 06/22/2015 11:21:08 AM By: Evlyn Kanner MD, FACS Entered By: Evlyn Kanner on 06/22/2015 11:21:07 Jordan [name] ([id number]) -------------------------------------------------------------------------------- Physician Orders Details Patient Name: Jordan [name] Date of Service: 06/22/2015 11:00 AM Medical Record Number: [id number] Patient Account Number: [id number] Date of Birth/Sex: 1940-09-17 (75 y.o. 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 Wound #4 Right,Dorsal Foot o Change dressing every day. JOSIEL, GAHM ([id number]) Follow-up Appointments Wound #3 Right,Circumferential Lower Leg o Return Appointment in 1 week. Wound #4 Right,Dorsal Foot o Return Appointment in 1 week. Medications-please add to medication list. Wound #3 Right,Circumferential Lower Leg o Other: - lortisone cream Wound #4 Right,Dorsal Foot o Other: -  lortisone cream Electronic Signature(s) Signed: 06/22/2015 12:11:32 PM By: Curtis Sites Signed: 06/22/2015 3:56:22 PM By: Evlyn Kanner MD, FACS Entered By: Curtis Sites on 06/22/2015 12:11:32 Jordan [name] ([id number]) -------------------------------------------------------------------------------- Problem List Details Patient Name: Jordan [name] Date of Service: 06/22/2015 11:00 AM Medical Record Number: [id number] Patient Account Number: [id number] Date of Birth/Sex: Aug 25, 1940 (75 y.o. Male) Treating RN: Curtis Sites Primary Care Physician: [name] Other Clinician: Referring Physician: [name] Treating Physician/Extender: Rudene Re in Treatment: 1 Active Problems ICD-10 Encounter Code Description Active Date Diagnosis E11.622 Type 2 diabetes mellitus with other skin ulcer 06/15/2015 Yes L97.211 Non-pressure chronic ulcer of right calf limited to 06/15/2015 Yes breakdown of skin B35.9 Dermatophytosis, unspecified 06/15/2015 Yes I70.232 Atherosclerosis of native arteries of right leg with 06/15/2015 Yes ulceration of calf Z89.512 Acquired absence of left leg below knee 06/15/2015 Yes Inactive Problems Resolved Problems Electronic Signature(s) Signed: 06/22/2015 11:19:31 AM By: Evlyn Kanner MD, FACS Entered By: Evlyn Kanner on 06/22/2015 11:19:31 Jordan [name] ([id number]) -------------------------------------------------------------------------------- Progress Note Details Patient Name: Jordan [name] Date of Service: 06/22/2015 11:00 AM Medical Record Number: [id number] Patient Account Number: [id number] Date of Birth/Sex: 10-23-1940 (75 y.o. Male) Treating RN: Curtis Sites Primary Care Physician: [name] Other Clinician: Referring Physician: [name] Treating Physician/Extender: Rudene Re in Treatment: 1 Subjective Chief Complaint Information obtained from Patient Patient presents to the wound care center for a consult due non healing wound to the right lower extremity which she's had for about 3 months now. History of Present Illness (HPI) The following HPI elements were documented for the patient's wound: Location: right lower extremity Quality: Patient reports experiencing a dull pain to affected area(s). Severity: Patient states wound are getting worse. Duration: Patient has had the wound for > 3 months prior to seeking treatment at the wound center Timing: Pain in wound is Intermittent (comes and goes Context: The wound would happen gradually Modifying Factors: Other treatment(s) tried include: the patient was seen by his PCP who put the patient on clindamycin, dressed the wound with Silvadene and applied a 4-layer Profore compression Associated Signs and Symptoms: Patient reports having:redness to the right lower extremity. Very pleasant 75 year old gentleman who comes along with his wife for a wound care consultation having been sent to [place] by his PCP. he comes for an unrelated problem but had been seen by as about a year ago as a one-time consult and the patient had followed up with his primary surgeon Dr. Lajoyce Corners. 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 He has had most of his surgeries and procedures done at the [facility] in [place] and of note on 03/24/2014 he was seen by Dr. 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 Prior to that his surgeon was Dr. Lajoyce Corners and Dr. Lajoyce Corners has been some procedures on his left foot for several months during the latter part of 2015. Dr. Edilia Bo is the patient's vascular surgeon and on 06/13/2014 Dr. Edilia Bo saw him for a non-healing ulcer on the right heel and did a aortogram and bilateral iliac arteriogram. the right lower extremity runoff showed that he had single-vessel runoff of the right peroneal artery and the anterior tibial and posterior tibial were  occluded. He also had a stenosis of the tibioperoneal trunk. No angioplasty was done at this stage and Dr. Edilia Bo was of the opinion that if his wound heals well then he would avoid surgery. However if his heel gets worse a PTA of the tibioperoneal trunk would be done and/ or a vein angioplasty. patient's diabetes is better controlled and his last Hemoglobin A1c was 8. has also been put on Bactrim for the last 2 weeks by his primary care doctor. He was last seen by Dr. [name] on 01/11/2015, and the arterial Doppler study done shows LEOTHA, VOELTZ ([id number]) monophasic Doppler signals in the right dorsalis pedis and posterior tibial. ABI on the right is 0.86 and TBI was 78 mmHg or 0.56 Dr. Durwin Nora s opinion was at this stage he would not recommend any surgical intervention and if there was significant wounds which would not heal. The patient is a former smoker quit in November 2012. Most recently he was seen at his PCPs office on 06/08/2015 for an ulcer of the right shin which had been treated with Silvadene and oral clindamycin. 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 Objective Constitutional Pulse regular. Respirations normal and unlabored. Afebrile. Vitals Time Taken: 10:58 AM, Height: 69 in, Weight: 243 lbs, BMI: 35.9, Temperature: 97.8 F, Pulse: 56 bpm, Respiratory Rate: 18 breaths/min, Blood Pressure: 117/47 mmHg. Eyes Nonicteric. Reactive to light. Ears, Nose, Mouth, and Throat Lips, teeth, and gums WNL.[name] Moist mucosa without lesions. Neck supple and nontender. No palpable supraclavicular or cervical adenopathy. Normal sized without goiter. Respiratory WNL. No retractions.. Cardiovascular Pedal Pulses WNL. No clubbing, cyanosis or edema. Lymphatic No adneopathy. No adenopathy. No adenopathy. Musculoskeletal Adexa without tenderness or enlargement.. Digits and nails w/o clubbing, cyanosis, infection, petechiae, ischemia, or inflammatory conditions.[name] Psychiatric Judgement and insight Intact.. 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 Integumentary (Hair, Skin) No suspicious lesions. No crepitus or fluctuance. No peri-wound warmth or erythema. No masses.. Wound #3 status is Open. Original cause of wound was Gradually Appeared. The wound is located on the Right,Circumferential Lower Leg. The wound measures 0.1cm length x 0.1cm width x 0.1cm depth; 0.008cm^2 area and 0.001cm^3 volume. The wound is limited to skin breakdown. There is a small amount of serous drainage noted. The wound margin is distinct with the outline attached to the wound base. There is large (67-100%) red, pink granulation within the  wound bed. There is no necrotic tissue within the wound bed. The periwound skin appearance exhibited: Moist, Erythema. The periwound skin appearance did not exhibit: Callus, Crepitus, Excoriation, Fluctuance, Friable, Induration, Localized Edema, Rash, Scarring, Dry/Scaly, Maceration, Atrophie Blanche, Cyanosis, Ecchymosis, Hemosiderin Staining, Mottled, Pallor, Rubor. The surrounding wound skin color is noted with erythema which is circumferential. Periwound temperature was noted as No Abnormality. Wound #4 status is Open. Original cause of wound was Gradually Appeared. The wound is located on the Right,Dorsal Foot. The wound measures 1.3cm length x 1cm width x 0.1cm depth; 1.021cm^2 area and 0.102cm^3 volume. The wound is limited to skin breakdown. There is no tunneling or undermining noted. There is a small amount of drainage noted. The wound margin is distinct with the outline attached to the  wound base. There is large (67-100%) red, pink granulation within the wound bed. There is no necrotic tissue within the wound bed. The periwound skin appearance exhibited: Localized Edema. The periwound skin appearance did not exhibit: Callus, Crepitus, Excoriation, Fluctuance, Friable, Induration, Rash, Scarring, Dry/Scaly, Maceration, Moist, Atrophie Blanche, Cyanosis, Ecchymosis, Hemosiderin Staining, Mottled, Pallor, Rubor, Erythema. Periwound temperature was noted as No Abnormality. 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.512 - Acquired absence of left leg below knee Plan Wound Cleansing: Wound #3 Right,Circumferential Lower Leg: JESHURUN, OAXACA ([id number]) Cleanse wound with mild soap and water Wound #4 Right,Dorsal Foot: Cleanse wound with  mild soap and water Skin Barriers/Peri-Wound Care: Wound #3 Right,Circumferential Lower Leg: Other: - triamcenalone 0.1% Wound #4 Right,Dorsal Foot: Other: - triamcenalone 0.1% Secondary Dressing: Wound #3 Right,Circumferential Lower Leg: Conform/Kerlix Wound #4 Right,Dorsal Foot: Conform/Kerlix Dressing Change Frequency: Wound #3 Right,Circumferential Lower Leg: Change dressing every day. Wound #4 Right,Dorsal Foot: Change dressing every day. Follow-up Appointments: Wound #3 Right,Circumferential Lower Leg: Return Appointment in 1 week. Wound #4 Right,Dorsal Foot: Return Appointment in 1 week. Medications-please add to medication list.: Wound #3 Right,Circumferential Lower Leg: Other: - lortisone cream Wound #4 Right,Dorsal Foot: Other: - lortisone cream I have recommended Lotrisone cream to be applied daily with a light Kerlix bandage. Emphasis is to put it between his toes to so that he treats this fungal infection in the webspaces Come back for review and will be seeing his vascular surgeon in early April. The patient and his wife have all questions answered and will be compliant. Electronic Signature(s) Signed: 06/22/2015 3:59:29 PM By: Evlyn Kanner MD, FACS Previous Signature: 06/22/2015 11:22:24 AM Version By: Evlyn Kanner MD, FACS Entered By: Evlyn Kanner on 06/22/2015 15:59:29 Jordan [name] ([id number]) -------------------------------------------------------------------------------- SuperBill Details Patient Name: Jordan [name] Date of Service: 06/22/2015 Medical Record Number: [id number] Patient Account Number: [id number] Date of Birth/Sex: Sep 10, 1940 (75 y.o. 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 Facility Procedures CPT4 Code: 08657846 Description: 99213 - WOUND CARE VISIT-LEV 3 EST PT Modifier: Quantity: 1 Physician Procedures CPT4 Code Description: [id number] 99213 - WC PHYS LEVEL 3 - EST PT ICD-10 Description Diagnosis E11.622 Type 2 diabetes mellitus with other skin ulcer L97.211 Non-pressure chronic ulcer of right calf limited to br B35.9 Dermatophytosis, unspecified I70.232  Atherosclerosis of native arteries of right leg with u Modifier: eakdown of skin lceration of ca Quantity: 1 lf Electronic Signature(s) Signed: 06/22/2015 12:56:02 PM By: Curtis Sites Signed: 06/22/2015 3:56:22 PM By: Evlyn Kanner MD, FACS Previous Signature: 06/22/2015 11:22:43 AM Version By: Evlyn Kanner MD, FACS Entered By: Curtis Sites on 06/22/2015 12:56:01

## 2015-06-29 ENCOUNTER — Encounter: Payer: Medicare Other | Admitting: Surgery

## 2015-06-29 DIAGNOSIS — E11622 Type 2 diabetes mellitus with other skin ulcer: Secondary | ICD-10-CM | POA: Diagnosis not present

## 2015-06-30 NOTE — Progress Notes (Signed)
Jordan [name], Jordan [name] ([id number]) Visit Report for 06/29/2015 Chief Complaint Document Details Patient Name: Jordan [name] Date of Service: 06/29/2015 10:45 AM Medical Record Number: [id number] Patient Account Number: [id number] Date of Birth/Sex: 16-Aug-1940 (75 y.o. Male) Treating RN: Curtis Sites Primary Care Physician: [name] Other Clinician: Referring Physician: [name] Treating Physician/Extender: Rudene Re in Treatment: 2 Information Obtained from: Patient Chief Complaint Patient presents to the wound care center for a consult due non healing wound to the right lower extremity which she's had for about 3 months now. Electronic Signature(s) Signed: 06/29/2015 11:45:44 AM By: Evlyn Kanner MD, FACS Entered By: Evlyn Kanner on 06/29/2015 11:45:44 Jordan [name] ([id number]) -------------------------------------------------------------------------------- HPI Details Patient Name: Jordan [name] Date of Service: 06/29/2015 10:45 AM Medical Record Number: [id number] Patient Account Number: [id number] Date of Birth/Sex: 1941-03-01 (75 y.o. Male) Treating RN: Curtis Sites Primary Care Physician: [name] Other Clinician: Referring Physician: [name] Treating Physician/Extender: Rudene Re in Treatment: 2 History of Present Illness Location: right lower extremity Quality: Patient reports experiencing a dull pain to affected area(s). Severity: Patient states wound are getting worse. Duration: Patient has had the wound for > 3 months prior to seeking treatment at the wound center Timing: Pain in wound is Intermittent (comes and goes Context: The wound would happen gradually Modifying Factors: Other treatment(s) tried include: the patient was seen by his PCP who put the patient on clindamycin, dressed the wound with Silvadene and applied a 4-layer Profore compression Associated Signs and Symptoms: Patient reports having:redness to the right  lower extremity. HPI Description: Very pleasant 75 year old gentleman who comes along with his wife for a wound care consultation having been sent to [place] by his PCP. he comes for an unrelated problem but had been seen by as about a year ago as a one-time consult and the patient had followed up with his primary surgeon Dr. Lajoyce Corners. 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 He has had most of his surgeries and procedures done at the [facility] in [place] and of note on 03/24/2014 he was seen by Dr. 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 Prior to that his surgeon was Dr. Lajoyce Corners and Dr. Lajoyce Corners has been some procedures on his left foot for several months during the latter part of 2015. Dr. Edilia Bo is the patient's vascular surgeon and on 06/13/2014 Dr. Edilia Bo saw him for a non-healing ulcer on the right heel and did a aortogram and bilateral iliac arteriogram. 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 He also had a stenosis of the tibioperoneal trunk. No angioplasty was done at this stage and Dr. Edilia Bo was of the opinion that if his wound heals well then he would avoid surgery. However if his heel gets worse a PTA of the tibioperoneal trunk would be done and/ or a vein angioplasty. patient's diabetes is better controlled and his last Hemoglobin A1c was 8. has also been put on Bactrim for the last 2 weeks by his primary care doctor. He was last seen by Dr. [name] on 01/11/2015, and the arterial Doppler study done shows monophasic Doppler  signals in the right dorsalis pedis and posterior tibial. ABI on the right is 0.86 and TBI was 78 mmHg or 0.56 Dr. Enos Fling opinion was at this stage he would not recommend any surgical intervention and if there was significant wounds which would not heal. The patient is a former smoker quit in November 2012. Most recently he was seen at his PCPs office on 06/08/2015 for an ulcer of the right shin which had been treated with Silvadene and oral clindamycin. 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 Electronic Signature(s) Signed: 06/29/2015 11:46:19 AM By: Evlyn Kanner MD, FACS Entered By: Evlyn Kanner on 06/29/2015 11:46:19 Jordan [name] ([id number]) -------------------------------------------------------------------------------- Physical Exam Details Patient Name: Jordan [name] Date of Service: 06/29/2015 10:45 AM Medical Record Number: [id number] Patient Account Number: [id number] Date of Birth/Sex: Jun 19, 1940 (75 y.o. Male) Treating RN: Curtis Sites Primary Care Physician: [name] Other Clinician: Referring Physician: [name] Treating Physician/Extender: Rudene Re in Treatment: 2 Constitutional . Pulse regular. Respirations normal and unlabored. Afebrile. . Eyes Nonicteric. Reactive to light. Ears, Nose, Mouth, and Throat Lips, teeth, and gums WNL.[name] Moist mucosa without lesions. Neck supple and nontender. No palpable supraclavicular or cervical adenopathy. Normal sized without goiter. Respiratory WNL. No retractions.. Cardiovascular Pedal Pulses WNL. No clubbing, cyanosis or edema. Lymphatic No adneopathy. No adenopathy. No adenopathy. Musculoskeletal Adexa without tenderness or enlargement.. Digits and nails w/o  clubbing, cyanosis, infection, petechiae, ischemia, or inflammatory conditions.. Integumentary (Hair, Skin) No suspicious lesions. No crepitus or fluctuance. No peri-wound warmth or erythema. No masses.[name] Psychiatric Judgement and insight Intact.. No evidence of depression, anxiety, or agitation.. Notes his right lower extremity but for stage I lymphedema, looks excellent with no open ulcerations and all his fungal infections including that between his toes has disappeared. Electronic Signature(s) Signed: 06/29/2015 11:47:04 AM By: Evlyn Kanner MD, FACS Entered By: Evlyn Kanner on 06/29/2015 11:47:03 Jordan [name] ([id number]) -------------------------------------------------------------------------------- Physician Orders Details Patient Name: Jordan [name] Date of Service: 06/29/2015 10:45 AM Medical Record Number: [id number] Patient Account Number: [id number] Date of Birth/Sex: 05/27/1940 (75 y.o. Male) Treating RN: Curtis Sites Primary Care Physician: [name] Other Clinician: Referring Physician: [name] Treating Physician/Extender: Rudene Re in Treatment: 2 Verbal / Phone Orders: Yes Clinician: Curtis Sites Read Back and Verified: Yes Diagnosis Coding Discharge From [facility] Services o Discharge from Wound Care Center Electronic Signature(s) Signed: 06/29/2015 3:40:45 PM By: Evlyn Kanner MD, FACS Signed: 06/29/2015 5:06:11 PM By: Curtis Sites Entered By: Curtis Sites on 06/29/2015 11:17:48 Jordan [name] ([id number]) -------------------------------------------------------------------------------- Problem List Details Patient Name: Jordan [name] Date of Service: 06/29/2015 10:45 AM Medical Record Number: [id number] Patient Account Number: [id number] Date of Birth/Sex: [birth year] (75 y.o. 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/15/2015 Yes breakdown of skin B35.9 Dermatophytosis, unspecified 06/15/2015 Yes I70.232 Atherosclerosis of native arteries of right leg with 06/15/2015 Yes ulceration of calf Z89.512 Acquired absence of left leg below knee 06/15/2015 Yes Inactive Problems Resolved Problems Electronic Signature(s) Signed: 06/29/2015 11:45:34 AM By: Evlyn Kanner MD, FACS Entered By: Evlyn Kanner on 06/29/2015 11:45:34 Jordan [name] ([id number]) -------------------------------------------------------------------------------- Progress Note  Details Patient Name: JONCARLOS, ATKISON Date of Service: 06/29/2015 10:45 AM Medical Record Number: [id number] Patient Account Number: [id number] Date of Birth/Sex: 06/20/1940 (75 y.o. Male) Treating RN: Curtis Sites Primary Care Physician: [name] Other Clinician: Referring Physician: [name] Treating Physician/Extender: Rudene Re in Treatment: 2 Subjective Chief Complaint Information obtained from Patient Patient presents to the wound care center for a consult due non healing wound to the right lower extremity which she's had for about 3 months now. History of Present Illness (HPI) The following HPI elements were documented for the patient's wound: Location: right lower extremity Quality: Patient reports experiencing a dull pain to affected area(s). Severity: Patient states wound are getting worse. Duration: Patient has had the wound for > 3 months prior to seeking treatment at the wound center Timing: Pain in wound is Intermittent (comes and goes Context: The wound would happen gradually Modifying Factors: Other treatment(s) tried include: the patient was seen by his PCP who put the patient  on clindamycin, dressed the wound with Silvadene and applied a 4-layer Profore compression Associated Signs and Symptoms: Patient reports having:redness to the right lower extremity. Very pleasant 75 year old gentleman who comes along with his wife for a wound care consultation having been sent to [place] by his PCP. he comes for an unrelated problem but had been seen by as about a year ago as a one-time consult and the patient had followed up with his primary surgeon Dr. Lajoyce Corners. 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 He has had most of his surgeries and procedures done at the [facility] in [place] and of note on 03/24/2014 he was seen by Dr. 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 Prior to that his surgeon was Dr. Lajoyce Corners and Dr. Lajoyce Corners has been some procedures on his left foot for several months during the latter part of 2015. Dr. Edilia Bo is the patient's vascular surgeon and on 06/13/2014 Dr. Edilia Bo saw him for a non-healing ulcer on the right heel and did a aortogram and bilateral iliac arteriogram. 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 He also had a stenosis of the tibioperoneal trunk. No angioplasty was done at this stage and Dr. Edilia Bo was of the opinion that if his wound heals well then he would avoid surgery. However if his heel gets worse a PTA of the tibioperoneal trunk would be done and/ or a vein angioplasty. patient's diabetes is better controlled and his last Hemoglobin A1c was 8. has also been put on Bactrim for the last 2 weeks  by his primary care doctor. He was last seen by Dr. [name] on 01/11/2015, and the arterial Doppler study done shows COLIE, FUGITT ([id number]) monophasic Doppler signals in the right dorsalis pedis and posterior tibial. ABI on the right is 0.86 and TBI was 78 mmHg or 0.56 Dr. Durwin Nora s opinion was at this stage he would not recommend any surgical intervention and if there was significant wounds which would not heal. The patient is a former smoker quit in November 2012. Most recently he was seen at his PCPs office on 06/08/2015  for an ulcer of the right shin which had been treated with Silvadene and oral clindamycin. 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 Objective Constitutional Pulse regular. Respirations normal and unlabored. Afebrile. Vitals Time Taken: 10:58 AM, Height: 69 in, Weight: 243 lbs, BMI: 35.9, Temperature: 98.0 F, Pulse: 55 bpm, Respiratory Rate: 18 breaths/min, Blood Pressure: 118/48 mmHg. Eyes Nonicteric. Reactive to light. Ears, Nose, Mouth, and Throat Lips, teeth, and gums WNL.[name] Moist mucosa without lesions. Neck supple and nontender. No palpable supraclavicular or cervical adenopathy. Normal sized without goiter. Respiratory WNL. No retractions.. Cardiovascular Pedal Pulses WNL. No clubbing, cyanosis or edema. Lymphatic No adneopathy. No adenopathy. No adenopathy. Musculoskeletal Adexa without tenderness or enlargement.. Digits and nails w/o clubbing, cyanosis, infection, petechiae, ischemia, or inflammatory conditions.[name] Psychiatric Judgement and insight Intact.. No evidence of depression, anxiety, or agitation.[name] MITSURU, DAULT ([id number]) General Notes: his right lower extremity but for stage I lymphedema, looks excellent with no  open ulcerations and all his fungal infections including that between his toes has disappeared. Integumentary (Hair, Skin) No suspicious lesions. No crepitus or fluctuance. No peri-wound warmth or erythema. No masses.. Wound #3 status is Healed - Epithelialized. Original cause of wound was Gradually Appeared. The wound is located on the Right,Circumferential Lower Leg. The wound measures 0cm length x 0cm width x 0cm depth; 0cm^2 area and 0cm^3 volume. Wound #4 status is Healed - Epithelialized. Original cause of wound was Gradually Appeared. The wound is located on the Right,Dorsal Foot. The wound measures 0cm length x 0cm width x 0cm depth; 0cm^2 area and 0cm^3 volume. 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 We will be seeing his vascular surgeon Dr. Edilia Bo in mid April. At this stage I have discharged him from the wound care services and will see him back only as needed. Plan Discharge From [facility] Services: Discharge from [facility] [place], [place] ([id number]) The ulceration and all his other issues with the right lower extremity have completely subsided as far as wounds go. We will be seeing his vascular surgeon Dr. Edilia Bo in mid April. At this stage I have discharged him from the wound care services and will see him back only as needed. Electronic Signature(s) Signed: 06/29/2015 11:48:51 AM By: Evlyn Kanner MD, FACS Entered By: Evlyn Kanner on 06/29/2015 11:48:51 Jordan [name]  ([id number]) -------------------------------------------------------------------------------- SuperBill Details Patient Name: Jordan [name] Date of Service: 06/29/2015 Medical Record Number: [id number] Patient Account Number: [id number] Date of Birth/Sex: [birth year] (75 y.o. 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 Facility Procedures CPT4 Code: [id number] Description: 99213 - WOUND CARE VISIT-LEV 3 EST PT Modifier:  Quantity: 1 Physician Procedures CPT4 Code Description: [id number] 99212 - WC PHYS LEVEL 2 - EST PT ICD-10 Description Diagnosis E11.622 Type 2 diabetes mellitus with other skin ulcer L97.211 Non-pressure chronic ulcer of right calf limited to br B35.9 Dermatophytosis, unspecified I70.232  Atherosclerosis of native arteries of right leg with u Modifier: eakdown of skin lceration of ca Quantity: 1 lf Electronic Signature(s) Signed: 06/29/2015 11:49:15 AM By: Evlyn KannerBritto, Sanaia Jasso MD, FACS Entered By: Evlyn KannerBritto, Caliah Kopke on 06/29/2015 11:49:14

## 2015-06-30 NOTE — Progress Notes (Signed)
Jordan CheeksFARRAR, Whitney ([id number]) Visit Report for 06/29/2015 Arrival Information Details Patient Name: Jordan CheeksFARRAR, Duke Date of Service: 06/29/2015 10:45 AM Medical Record Number: [id number] Patient Account Number: [id number] Date of Birth/Sex: 03/21/1941 8(74 y.o. Male) Treating RN: Curtis Sitesorthy, Joanna Primary Care Physician: Lindwood QuaHOFFMAN, BYRON Other Clinician: Referring Physician: Lindwood QuaHOFFMAN, BYRON Treating Physician/Extender: Rudene ReBritto, Errol Weeks in Treatment: 2 Visit Information History Since Last Visit Added or deleted any medications: No Patient Arrived: Gilmer MorCane Any new allergies or adverse reactions: No Arrival Time: 10:56 Had a fall or experienced change in No Accompanied By: spouse activities of daily living that may affect Transfer Assistance: None risk of falls: Patient Identification Verified: Yes Signs or symptoms of abuse/neglect since last No Secondary Verification Process Yes visito Completed: Hospitalized since last visit: No Patient Requires Transmission-Based No Pain Present Now: No Precautions: Patient Has Alerts: No Electronic Signature(s) Signed: 06/29/2015 5:06:11 PM By: Curtis Sitesorthy, Joanna Entered By: Curtis Sitesorthy, Joanna on 06/29/2015 10:57:37 Jordan CheeksFARRAR, Pasquale ([id number]) -------------------------------------------------------------------------------- Clinic Level of Care Assessment Details Patient Name: Jordan CheeksFARRAR, Edis Date of Service: 06/29/2015 10:45 AM Medical Record Number: [id number] Patient Account Number: [id number] Date of Birth/Sex: 03/21/1941 7(74 y.o. Male) Treating RN: Curtis Sitesorthy, Joanna Primary Care Physician: Lindwood QuaHOFFMAN, BYRON Other Clinician: Referring Physician: Lindwood QuaHOFFMAN, BYRON Treating Physician/Extender: Rudene ReBritto, Errol Weeks in Treatment: 2 Clinic Level of Care Assessment Items TOOL 4 Quantity Score []  - Use when only an EandM is performed on FOLLOW-UP visit 0 ASSESSMENTS - Nursing Assessment / Reassessment X - Reassessment of Co-morbidities (includes updates in patient  status) 1 10 X - Reassessment of Adherence to Treatment Plan 1 5 ASSESSMENTS - Wound and Skin Assessment / Reassessment []  - Simple Wound Assessment / Reassessment - one wound 0 X - Complex Wound Assessment / Reassessment - multiple wounds 2 5 []  - Dermatologic / Skin Assessment (not related to wound area) 0 ASSESSMENTS - Focused Assessment X - Circumferential Edema Measurements - multi extremities 1 5 []  - Nutritional Assessment / Counseling / Intervention 0 X - Lower Extremity Assessment (monofilament, tuning fork, pulses) 1 5 []  - Peripheral Arterial Disease Assessment (using hand held doppler) 0 ASSESSMENTS - Ostomy and/or Continence Assessment and Care []  - Incontinence Assessment and Management 0 []  - Ostomy Care Assessment and Management (repouching, etc.) 0 PROCESS - Coordination of Care X - Simple Patient / Family Education for ongoing care 1 15 []  - Complex (extensive) Patient / Family Education for ongoing care 0 []  - Staff obtains ChiropractorConsents, Records, Test Results / Process Orders 0 []  - Staff telephones HHA, Nursing Homes / Clarify orders / etc 0 []  - Routine Transfer to another Facility (non-emergent condition) 0 Jordan CheeksFARRAR, Nghia ([id number]) []  - Routine Hospital Admission (non-emergent condition) 0 []  - New Admissions / Manufacturing engineernsurance Authorizations / Ordering NPWT, Apligraf, etc. 0 []  - Emergency Hospital Admission (emergent condition) 0 X - Simple Discharge Coordination 1 10 []  - Complex (extensive) Discharge Coordination 0 PROCESS - Special Needs []  - Pediatric / Minor Patient Management 0 []  - Isolation Patient Management 0 []  - Hearing / Language / Visual special needs 0 []  - Assessment of Community assistance (transportation, D/C planning, etc.) 0 []  - Additional assistance / Altered mentation 0 []  - Support Surface(s) Assessment (bed, cushion, seat, etc.) 0 INTERVENTIONS - Wound Cleansing / Measurement []  - Simple Wound Cleansing - one wound 0 []  - Complex Wound  Cleansing - multiple wounds 0 X - Wound Imaging (photographs - any number of wounds) 1 5 []  - Wound Tracing (instead of photographs) 0 []  - Simple Wound Measurement -  one wound 0 X - Complex Wound Measurement - multiple wounds 2 5 INTERVENTIONS - Wound Dressings  - Small Wound Dressing one or multiple wounds 0  - Medium Wound Dressing one or multiple wounds 0  - Large Wound Dressing one or multiple wounds 0  - Application of Medications - topical 0  - Application of Medications - injection 0 INTERVENTIONS - Miscellaneous  - External ear exam 0 DAMAREON, LANNI ([id number])  - Specimen Collection (cultures, biopsies, blood, body fluids, etc.) 0  - Specimen(s) / Culture(s) sent or taken to Lab for analysis 0  - Patient Transfer (multiple staff / Michiel Sites Lift / Similar devices) 0  - Simple Staple / Suture removal (25 or less) 0  - Complex Staple / Suture removal (26 or more) 0  - Hypo / Hyperglycemic Management (close monitor of Blood Glucose) 0  - Ankle / Brachial Index (ABI) - do not check if billed separately 0 X - Vital Signs 1 5 Has the patient been seen at the hospital within the last three years: Yes Total Score: 80 Level Of Care: New/Established - Level 3 Electronic Signature(s) Signed: 06/29/2015 5:06:11 PM By: Curtis Sites Entered By: Curtis Sites on 06/29/2015 11:18:33 Jordan [name] ([id number]) -------------------------------------------------------------------------------- Encounter Discharge Information Details Patient Name: Jordan [name] Date of Service: 06/29/2015 10:45 AM Medical Record Number: [id number] Patient Account Number: [id number] Date of Birth/Sex: 1940-07-07 (75 y.o. Male) Treating RN: Curtis Sites Primary Care Physician: [name] Other Clinician: Referring Physician: [name] Treating Physician/Extender: Rudene Re in Treatment: 2 Encounter Discharge Information Items Discharge Pain Level: 0 Discharge  Condition: Stable Ambulatory Status: Cane Discharge Destination: Home Transportation: Private Auto Accompanied By: spouse Schedule Follow-up Appointment: No Medication Reconciliation completed and provided to Patient/Care No Tennie Grussing: Provided on Clinical Summary of Care: 06/29/2015 Form Type Recipient Paper Patient TF Electronic Signature(s) Signed: 06/29/2015 11:23:05 AM By: Gwenlyn Perking Entered By: Gwenlyn Perking on 06/29/2015 11:23:05 Jordan [name] ([id number]) -------------------------------------------------------------------------------- Lower Extremity Assessment Details Patient Name: Jordan [name] Date of Service: 06/29/2015 10:45 AM Medical Record Number: [id number] Patient Account Number: [id number] Date of Birth/Sex: Feb 08, 1941 (75 y.o. Male) Treating RN: Curtis Sites Primary Care Physician: [name] Other Clinician: Referring Physician: [name] Treating Physician/Extender: Rudene Re in Treatment: 2 Edema Assessment Assessed: [Left: No] [Right: No] Edema: [Left: Ye] [Right: s] Calf Left: Right: Point of Measurement: 38 cm From Medial Instep cm 41.5 cm Ankle Left: Right: Point of Measurement: 8 cm From Medial Instep cm 26.2 cm Vascular Assessment Pulses: Posterior Tibial Dorsalis Pedis Palpable: [Right:Yes] Extremity colors, hair growth, and conditions: Extremity Color: [Right:Hyperpigmented] Hair Growth on Extremity: [Right:No] Temperature of Extremity: [Right:Warm] Capillary Refill: [Right:< 3 seconds] Electronic Signature(s) Signed: 06/29/2015 5:06:11 PM By: Curtis Sites Entered By: Curtis Sites on 06/29/2015 11:03:06 Jordan [name] ([id number]) -------------------------------------------------------------------------------- Patient/Caregiver Education Details Patient Name: Jordan [name] Date of Service: 06/29/2015 10:45 AM Medical Record Number: [id number] Patient Account Number: [id number] Date of Birth/Gender: November 14, 1940  (74 y.o. Male) Treating RN: Curtis Sites Primary Care Physician: [name] Other Clinician: Referring Physician: [name] Treating Physician/Extender: Rudene Re in Treatment: 2 Education Assessment Education Provided To: Patient and Caregiver Education Topics Provided Basic Hygiene: Handouts: Other: skin care of newly healed wound sites Methods: Explain/Verbal Responses: State content correctly Venous: Handouts: Other: leg elevation for edema Methods: Explain/Verbal Responses: State content correctly Electronic Signature(s) Signed: 06/29/2015 11:41:42 AM By: Curtis Sites Entered By: Curtis Sites on 06/29/2015 11:41:42 Jordan [name] ([id number]) -------------------------------------------------------------------------------- Wound Assessment Details Patient Name: Jordan [name] Date of Service:  06/29/2015 10:45 AM Medical Record Number: [id number] Patient Account Number: [id number] Date of Birth/Sex: 1940/09/21 (75 y.o. Male) Treating RN: Curtis Sites Primary Care Physician: [name] Other Clinician: Referring Physician: [name] Treating Physician/Extender: Rudene Re in Treatment: 2 Wound Status Wound Number: 3 Primary Etiology: Cellulitis Wound Location: Right, Circumferential Lower Wound Status: Healed - Epithelialized Leg Wounding Event: Gradually Appeared Date Acquired: 04/11/2015 Weeks Of Treatment: 2 Clustered Wound: No Photos Photo Uploaded By: Curtis Sites on 06/29/2015 11:43:55 Wound Measurements Length: (cm) 0 % Reduction in Width: (cm) 0 % Reduction in Depth: (cm) 0 Area: (cm) 0 Volume: (cm) 0 Area: 100% Volume: 100% Wound Description Classification: Partial Thickness Periwound Skin Texture Texture Color No Abnormalities Noted: No No Abnormalities Noted: No Moisture No Abnormalities Noted: No Electronic Signature(s) Signed: 06/29/2015 5:06:11 PM By: Jacky Kindle, Maisie Fus  ([id number]) Entered By: Curtis Sites on 06/29/2015 11:17:18 Jordan [name] ([id number]) -------------------------------------------------------------------------------- Wound Assessment Details Patient Name: Jordan [name] Date of Service: 06/29/2015 10:45 AM Medical Record Number: [id number] Patient Account Number: [id number] Date of Birth/Sex: July 15, 1940 (75 y.o. Male) Treating RN: Curtis Sites Primary Care Physician: [name] Other Clinician: Referring Physician: [name] Treating Physician/Extender: Rudene Re in Treatment: 2 Wound Status Wound Number: 4 Primary Etiology: Cellulitis Wound Location: Right, Dorsal Foot Wound Status: Healed - Epithelialized Wounding Event: Gradually Appeared Date Acquired: 04/04/2015 Weeks Of Treatment: 2 Clustered Wound: No Photos Photo Uploaded By: Curtis Sites on 06/29/2015 11:44:24 Wound Measurements Length: (cm) 0 % Reduction in Width: (cm) 0 % Reduction in Depth: (cm) 0 Area: (cm) 0 Volume: (cm) 0 Area: 100% Volume: 100% Wound Description Classification: Partial Thickness Periwound Skin Texture Texture Color No Abnormalities Noted: No No Abnormalities Noted: No Moisture No Abnormalities Noted: No Electronic Signature(s) Signed: 06/29/2015 5:06:11 PM By: Jacky Kindle, Maisie Fus ([id number]) Entered By: Curtis Sites on 06/29/2015 11:17:18 Jordan [name] ([id number]) -------------------------------------------------------------------------------- Vitals Details Patient Name: Jordan [name] Date of Service: 06/29/2015 10:45 AM Medical Record Number: [id number] Patient Account Number: [id number] Date of Birth/Sex: June 17, 1940 (75 y.o. Male) Treating RN: Curtis Sites Primary Care Physician: [name] Other Clinician: Referring Physician: [name] Treating Physician/Extender: Rudene Re in Treatment: 2 Vital Signs Time Taken: 10:58 Temperature (F): 98.0 Height (in):  69 Pulse (bpm): 55 Weight (lbs): 243 Respiratory Rate (breaths/min): 18 Body Mass Index (BMI): 35.9 Blood Pressure (mmHg): 118/48 Reference Range: 80 - 120 mg / dl Electronic Signature(s) Signed: 06/29/2015 5:06:11 PM By: Curtis Sites Entered By: Curtis Sites on 06/29/2015 10:58:53

## 2015-07-04 ENCOUNTER — Encounter: Payer: Self-pay | Admitting: Family

## 2015-07-12 ENCOUNTER — Ambulatory Visit (INDEPENDENT_AMBULATORY_CARE_PROVIDER_SITE_OTHER): Payer: Medicare Other | Admitting: Family

## 2015-07-12 ENCOUNTER — Ambulatory Visit (HOSPITAL_COMMUNITY)
Admission: RE | Admit: 2015-07-12 | Discharge: 2015-07-12 | Disposition: A | Discharge status: Home / Self Care | Payer: Medicare Other | Source: Ambulatory Visit | Attending: Family | Admitting: Family

## 2015-07-12 ENCOUNTER — Encounter: Payer: Self-pay | Admitting: Family

## 2015-07-12 VITALS — BP 129/62 | HR 60 | Temp 98.2°F | Resp 12 | Ht 69.0 in | Wt 239.5 lb

## 2015-07-12 DIAGNOSIS — E785 Hyperlipidemia, unspecified: Secondary | ICD-10-CM | POA: Diagnosis not present

## 2015-07-12 DIAGNOSIS — R938 Abnormal findings on diagnostic imaging of other specified body structures: Secondary | ICD-10-CM | POA: Diagnosis not present

## 2015-07-12 DIAGNOSIS — E1165 Type 2 diabetes mellitus with hyperglycemia: Secondary | ICD-10-CM

## 2015-07-12 DIAGNOSIS — I872 Venous insufficiency (chronic) (peripheral): Secondary | ICD-10-CM | POA: Diagnosis not present

## 2015-07-12 DIAGNOSIS — Z89512 Acquired absence of left leg below knee: Secondary | ICD-10-CM

## 2015-07-12 DIAGNOSIS — I110 Hypertensive heart disease with heart failure: Secondary | ICD-10-CM | POA: Insufficient documentation

## 2015-07-12 DIAGNOSIS — I509 Heart failure, unspecified: Secondary | ICD-10-CM | POA: Insufficient documentation

## 2015-07-12 DIAGNOSIS — I739 Peripheral vascular disease, unspecified: Secondary | ICD-10-CM | POA: Insufficient documentation

## 2015-07-12 DIAGNOSIS — I779 Disorder of arteries and arterioles, unspecified: Secondary | ICD-10-CM | POA: Diagnosis not present

## 2015-07-12 DIAGNOSIS — Z87891 Personal history of nicotine dependence: Secondary | ICD-10-CM | POA: Diagnosis not present

## 2015-07-12 DIAGNOSIS — E1151 Type 2 diabetes mellitus with diabetic peripheral angiopathy without gangrene: Secondary | ICD-10-CM | POA: Diagnosis not present

## 2015-07-12 DIAGNOSIS — Z89522 Acquired absence of left knee: Secondary | ICD-10-CM

## 2015-07-12 DIAGNOSIS — E119 Type 2 diabetes mellitus without complications: Secondary | ICD-10-CM | POA: Insufficient documentation

## 2015-07-12 DIAGNOSIS — R0989 Other specified symptoms and signs involving the circulatory and respiratory systems: Secondary | ICD-10-CM | POA: Diagnosis present

## 2015-07-12 DIAGNOSIS — IMO0002 Reserved for concepts with insufficient information to code with codable children: Secondary | ICD-10-CM

## 2015-07-12 NOTE — Patient Instructions (Signed)
Peripheral Vascular Disease Peripheral vascular disease (PVD) is a disease of the blood vessels that are not part of your heart and brain. A simple term for PVD is poor circulation. In most cases, PVD narrows the blood vessels that carry blood from your heart to the rest of your body. This can result in a decreased supply of blood to your arms, legs, and internal organs, like your stomach or kidneys. However, it most often affects a person's lower legs and feet. There are two types of PVD.  Organic PVD. This is the more common type. It is caused by damage to the structure of blood vessels.  Functional PVD. This is caused by conditions that make blood vessels contract and tighten (spasm). Without treatment, PVD tends to get worse over time. PVD can also lead to acute ischemic limb. This is when an arm or limb suddenly has trouble getting enough blood. This is a medical emergency. CAUSES Each type of PVD has many different causes. The most common cause of PVD is buildup of a fatty material (plaque) inside of your arteries (atherosclerosis). Small amounts of plaque can break off from the walls of the blood vessels and become lodged in a smaller artery. This blocks blood flow and can cause acute ischemic limb. Other common causes of PVD include:  Blood clots that form inside of blood vessels.  Injuries to blood vessels.  Diseases that cause inflammation of blood vessels or cause blood vessel spasms.  Health behaviors and health history that increase your risk of developing PVD. RISK FACTORS  You may have a greater risk of PVD if you:  Have a family history of PVD.  Have certain medical conditions, including:  High cholesterol.  Diabetes.  High blood pressure (hypertension).  Coronary heart disease.  Past problems with blood clots.  Past injury, such as burns or a broken bone. These may have damaged blood vessels in your limbs.  Buerger disease. This is caused by inflamed blood  vessels in your hands and feet.  Some forms of arthritis.  Rare birth defects that affect the arteries in your legs.  Use tobacco.  Do not get enough exercise.  Are obese.  Are age 50 or older. SIGNS AND SYMPTOMS  PVD may cause many different symptoms. Your symptoms depend on what part of your body is not getting enough blood. Some common signs and symptoms include:  Cramps in your lower legs. This may be a symptom of poor leg circulation (claudication).  Pain and weakness in your legs while you are physically active that goes away when you rest (intermittent claudication).  Leg pain when at rest.  Leg numbness, tingling, or weakness.  Coldness in a leg or foot, especially when compared with the other leg.  Skin or hair changes. These can include:  Hair loss.  Shiny skin.  Pale or bluish skin.  Thick toenails.  Inability to get or maintain an erection (erectile dysfunction). People with PVD are more prone to developing ulcers and sores on their toes, feet, or legs. These may take longer than normal to heal. DIAGNOSIS Your health care provider may diagnose PVD from your signs and symptoms. The health care provider will also do a physical exam. You may have tests to find out what is causing your PVD and determine its severity. Tests may include:  Blood pressure recordings from your arms and legs and measurements of the strength of your pulses (pulse volume recordings).  Imaging studies using sound waves to take pictures of   the blood flow through your blood vessels (Doppler ultrasound).  Injecting a dye into your blood vessels before having imaging studies using:  X-rays (angiogram or arteriogram).  Computer-generated X-rays (CT angiogram).  A powerful electromagnetic field and a computer (magnetic resonance angiogram or MRA). TREATMENT Treatment for PVD depends on the cause of your condition and the severity of your symptoms. It also depends on your age. Underlying  causes need to be treated and controlled. These include long-lasting (chronic) conditions, such as diabetes, high cholesterol, and high blood pressure. You may need to first try making lifestyle changes and taking medicines. Surgery may be needed if these do not work. Lifestyle changes may include:  Quitting smoking.  Exercising regularly.  Following a low-fat, low-cholesterol diet. Medicines may include:  Blood thinners to prevent blood clots.  Medicines to improve blood flow.  Medicines to improve your blood cholesterol levels. Surgical procedures may include:  A procedure that uses an inflated balloon to open a blocked artery and improve blood flow (angioplasty).  A procedure to put in a tube (stent) to keep a blocked artery open (stent implant).  Surgery to reroute blood flow around a blocked artery (peripheral bypass surgery).  Surgery to remove dead tissue from an infected wound on the affected limb.  Amputation. This is surgical removal of the affected limb. This may be necessary in cases of acute ischemic limb that are not improved through medical or surgical treatments. HOME CARE INSTRUCTIONS  Take medicines only as directed by your health care provider.  Do not use any tobacco products, including cigarettes, chewing tobacco, or electronic cigarettes. If you need help quitting, ask your health care provider.  Lose weight if you are overweight, and maintain a healthy weight as directed by your health care provider.  Eat a diet that is low in fat and cholesterol. If you need help, ask your health care provider.  Exercise regularly. Ask your health care provider to suggest some good activities for you.  Use compression stockings or other mechanical devices as directed by your health care provider.  Take good care of your feet.  Wear comfortable shoes that fit well.  Check your feet often for any cuts or sores. SEEK MEDICAL CARE IF:  You have cramps in your legs  while walking.  You have leg pain when you are at rest.  You have coldness in a leg or foot.  Your skin changes.  You have erectile dysfunction.  You have cuts or sores on your feet that are not healing. SEEK IMMEDIATE MEDICAL CARE IF:  Your arm or leg turns cold and blue.  Your arms or legs become red, warm, swollen, painful, or numb.  You have chest pain or trouble breathing.  You suddenly have weakness in your face, arm, or leg.  You become very confused or lose the ability to speak.  You suddenly have a very bad headache or lose your vision.   This information is not intended to replace advice given to you by your health care provider. Make sure you discuss any questions you have with your health care provider.   Document Released: 04/25/2004 Document Revised: 04/08/2014 Document Reviewed: 08/26/2013 [employer] Interactive Patient Education 2016 [employer]  

## 2015-07-12 NOTE — Progress Notes (Signed)
[facility] [place] HISTORY AND PHYSICAL -PAD  History of Present Illness Jordan [name] is a 75 y.o. male patient of Dr. Edilia Boickson who had presented with an extensive diabetic foot infection of the left foot. He had extensive infrainguinal arterial occlusive disease and was not a candidate for an endovascular approach. He underwent left common femoral artery to anterior tibial artery bypass with a composite PTFE left greater saphenous vein graft on 01/28/2014. 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 Subsequently the foot wound was managed by Dr. Aldean BakerMarcus Duda. 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  Dr. Edilia Boickson last saw pt on 01/11/15. At that time the wounds on his right foot had healed. He does have some dry skin and Dr. Edilia Boickson encouraged him to keep the skin well lubricated and to use nystatin cream on the fungal infection on the dorsum of his foot. Given that he has reasonable Doppler flow in the foot, and a toe pressure of 78 mmHg,  Dr. Edilia Boickson would not recommend addressing the disease of his tibial peroneal trunk unless pt developed a nonhealing wound and clearly had a limb threatening situation. Based on his previous arteriogram that would be the only option and would be associated with significant risk given his small calcified vessels. Pt is to return in 6 months with follow up ABIs. He knows to call sooner if he has problems.   Pt has been treated at [facility] wound care center for venous stasis wounds and wife states a skin bx proved was a fungal and staph infection which was treated. By wife's description of weekly leg wrapping, he seemed to have UNA boot tx.   Pt denies any hx of stroke or TIA. He walks with a cane, denies claudication symptoms in right leg, scaling in right heel continues, macerated area between right 4th and 5th toes resolved with Lamisil use.  Pt Diabetic: Yes, pt states his last A1C was 8.3, states he is seeing  a dietician regularly.  Pt smoker: former smoker, quit in the 1980's  Pt meds include: Statin :Yes Betablocker: Yes ASA: Yes Other anticoagulants/antiplatelets: no    Past Medical History  Diagnosis Date  . CAD (coronary artery disease)     a. s/p IMI in past tx with POBA and cath 6 mos later with occluded RCA;  b. h/o Taxus DES to LAD and CFX;  c.  cath 1/10: LM ok, mLAD 40-50%, LAD stent ok, D2 tandem 60-70%, pCFX 30%, mAVCFX stent ok, pRCA 40%, RV 90%, RV marginal 90%, dRCA filled L-R collats tx medically  . Carotid stenosis     dopplers 5/11: 40-59% bilat  . DM2 (diabetes mellitus, type 2) (HCC)   . HTN (hypertension)   . HLD (hyperlipidemia)   . Diabetes mellitus   . Staphylococcus aureus bacteremia with sepsis (HCC)   . Osteomyelitis of left foot (HCC)   . Diabetic ulcer of left foot (HCC)   . Anemia   . Heart murmur   . CHF (congestive heart failure) (HCC)   . Myocardial infarction [facility](HCC) 1995 and 2005    Heart Attack    Social History Social History  Substance Use Topics  . Smoking status: Former Smoker    Types: Pipe    Quit date: 02/18/2011  . Smokeless tobacco: Never Used  . Alcohol Use: No    Family History Family History  Problem Relation Age of Onset  . Diabetes Mother   .  Cancer Mother     Pancreatic  . Heart disease Mother     After age 30  . Hypertension Mother   . Hyperlipidemia Mother   . Diabetes Sister   . Cancer Sister   . Cancer Sister     Bone  . Cancer Father     Past Surgical History  Procedure Laterality Date  . Cardiac catheterization    . Femoral-tibial bypass graft Left 01/28/2014    Procedure:  FEMORAL-ANTERIOR TIBIAL ARTERY Bypass Graft utilizing composite gortex graft and vein graft;  Surgeon: [name], MD;  Location: [facility] OR;  Service: Vascular;  Laterality: Left;  . Amputation Left 01/28/2014    Procedure: AMPUTATION DIGIT- LEFT 5TH TOE;  Surgeon: [name], MD;  Location: [facility] OR;  Service:  Vascular;  Laterality: Left;  . Amputation Left 02/02/2014    Procedure: Left 4th Ray Amputation vs Transmetatarsal Amputation;  Surgeon: [name], MD;  Location: [facility] OR;  Service: Orthopedics;  Laterality: Left;  . Abdominal aortagram N/A 04/07/2013    Procedure: ABDOMINAL [name];  Surgeon: [name], MD;  Location: [facility] CATH LAB;  Service: Cardiovascular;  Laterality: N/A;  . Lower extremity angiogram Left 01/26/2014    Procedure: LOWER EXTREMITY ANGIOGRAM;  Surgeon: [name], MD;  Location: [facility] CATH LAB;  Service: Cardiovascular;  Laterality: Left;  . Abdominal aortagram N/A 01/26/2014    Procedure: ABDOMINAL AORTAGRAM;  Surgeon: [name], MD;  Location: [facility] CATH LAB;  Service: Cardiovascular;  Laterality: N/A;  . Amputation Left 03/24/2014    Procedure: AMPUTATION BELOW KNEE;  Surgeon: [name], MD;  Location: [facility] OR;  Service: Orthopedics;  Laterality: Left;  . Tee without cardioversion N/A 03/28/2014    Procedure: TRANSESOPHAGEAL ECHOCARDIOGRAM (TEE);  Surgeon: Quintella Reichert, MD;  Location: [facility] ENDOSCOPY;  Service: Cardiovascular;  Laterality: N/A;  . Abdominal aortagram N/A 06/13/2014    Procedure: ABDOMINAL [name];  Surgeon: [name], MD;  Location: [facility] CATH LAB;  Service: Cardiovascular;  Laterality: N/A;    Allergies  Allergen Reactions  . Cilostazol Other (See Comments)    Feels like feet on fire    Current Outpatient Prescriptions  Medication Sig Dispense Refill  . acetaminophen (TYLENOL) 325 MG tablet Take 2 tablets (650 mg total) by mouth every 6 (six) hours as needed for mild pain (or Fever >/= 101).    [name] albuterol (PROVENTIL) (2.5 MG/3ML) 0.083% nebulizer solution Take 3 mLs (2.5 mg total) by nebulization every 6 (six) hours as needed for wheezing. 75 mL 12  . Amino Acids-Protein Hydrolys (FEEDING SUPPLEMENT, PRO-STAT SUGAR FREE 64,) LIQD Take 30 mLs by mouth 2 (two) times daily between meals. 900 mL 0  . amLODipine (NORVASC)  10 MG tablet Take 10 mg by mouth daily.    [name] aspirin 81 MG tablet Take 81 mg by mouth daily.    . Cinnamon 500 MG TABS Take 1 tablet by mouth daily.    [name] docusate sodium 100 MG CAPS Take 100 mg by mouth 2 (two) times daily. 10 capsule 0  . feeding supplement, GLUCERNA SHAKE, (GLUCERNA SHAKE) LIQD Take 237 mLs by mouth 2 (two) times daily between meals.  0  . gabapentin (NEURONTIN) 300 MG capsule Take 300 mg by mouth 2 (two) times daily.     . insulin aspart (NOVOLOG) 100 UNIT/ML injection Inject 2-15 Units into the skin 3 (three) times daily with meals. Sliding Scale    . Insulin Detemir (LEVEMIR Time) Inject 25  Units into the skin 2 (two) times daily.    . insulin glargine (LANTUS) 100 UNIT/ML injection Inject 0.2 mLs (20 Units total) into the skin 2 (two) times daily. (Patient taking differently: Inject 27 Units into the skin 2 (two) times daily. ) 10 mL 11  . isosorbide mononitrate (IMDUR) 30 MG 24 hr tablet Take 30 mg by mouth daily.    [name] losartan (COZAAR) 100 MG tablet Take 100 mg by mouth daily.    . metFORMIN (GLUCOPHAGE) 1000 MG tablet Take 1,000 mg by mouth 2 (two) times daily with a meal.    . metoprolol (TOPROL-XL) 100 MG 24 hr tablet Take 100 mg by mouth 2 (two) times daily.      . nitroGLYCERIN (NITRODUR - DOSED IN MG/24 HR) 0.2 mg/hr patch   3  . ondansetron (ZOFRAN) 4 MG tablet Take 1 tablet (4 mg total) by mouth every 6 (six) hours as needed for nausea. 20 tablet 0  . oxyCODONE (OXY IR/ROXICODONE) 5 MG immediate release tablet Take 1 tablet (5 mg total) by mouth every 4 (four) hours as needed for severe pain. 30 tablet 0  . OxyCODONE (OXYCONTIN) 20 mg T12A 12 hr tablet Take 20 mg by mouth as needed.    . pantoprazole (PROTONIX) 40 MG tablet Take 1 tablet (40 mg total) by mouth daily.    . polyethylene glycol (MIRALAX / GLYCOLAX) packet Take 17 g by mouth daily as needed for mild constipation. 14 each 0  . rosuvastatin (CRESTOR) 40 MG tablet Take 40 mg by mouth every evening.    .  silver sulfADIAZINE (SILVADENE) 1 % cream Apply 1 application topically daily.  3  . escitalopram (LEXAPRO) 10 MG tablet Take 10 mg by mouth at bedtime.      No current facility-administered medications for this visit.    ROS: See HPI for pertinent positives and negatives.   Physical Examination  Filed Vitals:   07/12/15 1509  BP: 129/62  Pulse: 60  Temp: 98.2 F (36.8 C)  TempSrc: Oral  Resp: 12  Height: 5\' 9"  (1.753 m)  Weight: 239 lb 8 oz (108.636 kg)  SpO2: 98%   Body mass index is 35.35 kg/(m^2).  General: A&O x 3, WDWN, obese male. Gait: slow, deliberate, using cane Eyes: pupils equal. Pulmonary: CTAB, without wheezes , rales or rhonchi. Cardiac: regular rhythm, no detected murmur.     Carotid Bruits Right Left   Negative Negative  Aorta is not palpable. Radial pulses are palpable.   VASCULAR EXAM: Extremities  left BKA with prosthesis in place. Right LE: Right heel with scales as at last visit. No gangrene. Healing dermatitis encircling right ankle and at dorsum of foot. No open wounds. 1+ pitting and non pitting edema in right foot and ankle.       LE Pulses Right Left   FEMORAL not palpable(obese) not palpable (obese)    POPLITEAL not palpable  not palpable   POSTERIOR TIBIAL Monophasic Doppler signal  BKA    DORSALIS PEDIS  ANTERIOR TIBIAL Monophasic Doppler signal  BKA    Abdomen: soft, NT, no palpable masses. Skin: no rashes, see extremities. Musculoskeletal: no muscle wasting or atrophy. Left BKA. Neurologic: A&O X 3; Appropriate Affect, MOTOR FUNCTION: moving all extremities equally, motor strength 4/5 throughout. Speech is fluent/normal.  CN 2-12 is grossly intact.               Non-Invasive Vascular  Imaging: DATE: 07/12/2015 ABI: RIGHT: 0.83 (0.86, 01/11/15), Waveforms: monophasic, TBI:  0.35, toe pressure 53;  LEFT: BKA   ASSESSMENT: Jordan [name] is a 75 y.o. male who is s/p left common femoral artery to anterior tibial artery bypass with a composite PTFE left greater saphenous vein graft on 01/28/2014. 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 Subsequently the foot wound was managed by Dr. Aldean Baker. 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 Dr. Edilia Bo would not recommend addressing the disease of his tibial peroneal trunk unless pt developed a nonhealing wound and clearly had a limb threatening situation. Based on his previous arteriogram that would be the only option and would be associated with significant risk given his small calcified vessels.   Pt's primary atherosclerotic risk factor remains his uncontrolled DM. He is also obese and a former smoker, quit in the 1980's.  Today's right ABI remains stable compared to six months ago; but ABI may be falsely elevated as monophasic waveforms do not correspond to 83% arterial perfusion. He has known calcified tibial trunk vessels c/w long term uncontrolled DM.  He has chronic venous insufficiency with stasis ulcers that apparently became infected with a fungal infection and staph, treated by the wound care center at [facility] with weekly medicated compression bandage therapy which pt and wife states healed his stasis ulcers encircling his right ankle and at the dorsal aspect of his foot. He sits in his recliner during the day which does not elevate his foot above his heart, and he is developing recurrent dependent edema; see Plan.  PLAN:  Based on the patient's vascular studies and examination, pt will return to clinic in 1 year with ABI's. Chronic venous insufficiency - Pt and wife instructed in adequate elevation of his right leg (at least 3x/day for 20 minutes and overnight) to minimize edema from venous  insufficiency.  Graduated walking program discussed in detail.   I discussed in depth with the patient the nature of atherosclerosis, and emphasized the importance of maximal medical management including strict control of blood pressure, blood glucose, and lipid levels, obtaining regular exercise, and continued cessation of smoking.  The patient is aware that without maximal medical management the underlying atherosclerotic disease process will progress, limiting the benefit of any interventions.  The patient was given information about PAD including signs, symptoms, treatment, what symptoms should prompt the patient to seek immediate medical care, and risk reduction measures to take.  Charisse March, RN, MSN, FNP-C [facility] [place] Phone: [phone]  Clinic MD: Edilia Bo  07/12/2015 3:54 PM

## 2015-08-08 NOTE — Addendum Note (Signed)
Addended by: Melodye PedMANESS-HARRISON, CHANDA C on: 08/08/2015 02:26 PM   Modules accepted: Orders

## 2015-09-26 ENCOUNTER — Encounter: Payer: Self-pay | Admitting: Cardiology

## 2016-03-01 LAB — HM DIABETES EYE EXAM

## 2016-06-26 ENCOUNTER — Encounter: Payer: Self-pay | Admitting: Adult Health

## 2016-06-26 ENCOUNTER — Ambulatory Visit (INDEPENDENT_AMBULATORY_CARE_PROVIDER_SITE_OTHER): Payer: Medicare Other | Admitting: Adult Health

## 2016-06-26 DIAGNOSIS — Z0000 Encounter for general adult medical examination without abnormal findings: Secondary | ICD-10-CM

## 2016-06-26 NOTE — Progress Notes (Signed)
Subjective:    Patient ID: Jordan [name], male    DOB: 27-Sep-1940, 76 y.o.   MRN: [id number]  HPI:  Jordan [name] is here to establish as a new pt.  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  PMH: Diabetes, CAD, CKD, CHF, PAD, Left BKA. [place] and I was instructed to come to this clinic to get a PCP".  However he already has a well established [name] and he has been treating him "for years and he knows me quite well".  He is compliant on all medications and denies SE.  [facility] and it clearly states that pt. Can only have one PCP.  I advised him to continue with Jordan [name] and request all PCP care through his office.  He may use our clinic for acute issues if needed. He denies urgent refill on any medications.  [facility]  Patient Care Team    Relationship Specialty Notifications Start End  Jordan QuaByron Hoffman, MD PCP - General Internal Medicine  12/24/10    Comment: Merged  Mary SellaMargaret L Brewer, NP Nurse Practitioner Internal Medicine  05/04/14   Nadara MustardMarcus Duda V, MD Consulting Physician Orthopedic Surgery  06/02/14     Patient Active Problem List   Diagnosis Date Noted  . Health care maintenance 06/26/2016  . Wound drainage-Right foot 06/02/2014  . Pressure sore-Right heel 06/02/2014  . Fissure in skin of foot-Right lateral 06/02/2014  . Diabetic ulcer of left foot (HCC)   . Bacteremia   . Staphylococcus aureus bacteremia with sepsis (HCC)   . Osteomyelitis of left foot (HCC) 03/24/2014  . Foot infection 03/24/2014  . Chronic diastolic heart failure (HCC) 03/23/2014  . Protein-calorie malnutrition, severe (HCC) 03/21/2014  . SIRS (systemic inflammatory response syndrome) (HCC) 03/21/2014  . Infected wound 03/20/2014  . Atherosclerosis of native arteries of the extremities with gangrene (HCC) 02/17/2014  . Diabetic ulcer of left foot associated with diabetes mellitus due to underlying condition  (HCC)   . Type 2 diabetes mellitus with diabetic chronic kidney disease (HCC)   . Diabetic foot ulcer (HCC) 01/24/2014  . Diabetic osteomyelitis (HCC) 01/24/2014  . Acute renal failure (HCC) 01/24/2014  . CKD (chronic kidney disease), stage III 01/24/2014  . Leukocytosis 01/24/2014  . Fever presenting with conditions classified elsewhere 01/24/2014  . Diarrhea 01/24/2014  . Gangrene (HCC) 01/24/2014  . PAD (peripheral artery disease) (HCC) 03/02/2013  . Aortic stenosis 01/09/2011  . Right flank pain 12/19/2010  . Murmur 12/19/2010  . DM (diabetes mellitus), type 2, uncontrolled, periph vascular complic (HCC) 02/02/2009  . Hyperlipidemia 02/02/2009  . Essential hypertension 02/02/2009  . MI 02/02/2009  . Coronary atherosclerosis 02/02/2009  . Cerebrovascular disease 02/02/2009  . Peripheral vascular disease (HCC) 02/02/2009  . DYSPNEA 02/02/2009  . CHEST PAIN 02/02/2009     Past Medical History:  Diagnosis Date  . Anemia   . CAD (coronary artery disease)    a. s/p IMI in past tx with POBA and cath 6 mos later with occluded RCA;  b. h/o Taxus DES to LAD and CFX;  c.  cath 1/10: LM ok, mLAD 40-50%, LAD stent ok, D2 tandem 60-70%, pCFX 30%, mAVCFX stent ok, pRCA 40%, RV 90%, RV marginal 90%, dRCA filled L-R collats tx medically  . Carotid stenosis    dopplers 5/11: 40-59% bilat  . CHF (congestive heart failure) (HCC)   . Diabetes mellitus   .  Diabetic ulcer of left foot (HCC)   . DM2 (diabetes mellitus, type 2) (HCC)   . Heart murmur   . HLD (hyperlipidemia)   . HTN (hypertension)   . Myocardial infarction 1995 and 2005   Heart Attack  . Osteomyelitis of left foot (HCC)   . Staphylococcus aureus bacteremia with sepsis [facility])      Past Surgical History:  Procedure Laterality Date  . ABDOMINAL AORTAGRAM N/A 04/07/2013   Procedure: ABDOMINAL [name];  Surgeon: [name], MD;  Location: [facility] CATH LAB;  Service: Cardiovascular;  Laterality: N/A;  . ABDOMINAL AORTAGRAM  N/A 01/26/2014   Procedure: ABDOMINAL [name];  Surgeon: [name], MD;  Location: [facility] CATH LAB;  Service: Cardiovascular;  Laterality: N/A;  . ABDOMINAL AORTAGRAM N/A 06/13/2014   Procedure: ABDOMINAL [name];  Surgeon: [name], MD;  Location: [facility] CATH LAB;  Service: Cardiovascular;  Laterality: N/A;  . AMPUTATION Left 01/28/2014   Procedure: AMPUTATION DIGIT- LEFT 5TH TOE;  Surgeon: [name], MD;  Location: [facility] OR;  Service: Vascular;  Laterality: Left;  . AMPUTATION Left 02/02/2014   Procedure: Left 4th Ray Amputation vs Transmetatarsal Amputation;  Surgeon: [name], MD;  Location: [facility] OR;  Service: Orthopedics;  Laterality: Left;  . AMPUTATION Left 03/24/2014   Procedure: AMPUTATION BELOW KNEE;  Surgeon: [name], MD;  Location: [facility] OR;  Service: Orthopedics;  Laterality: Left;  . APPENDECTOMY    . CARDIAC CATHETERIZATION    . CORONARY ANGIOPLASTY WITH STENT PLACEMENT    . FEMORAL-TIBIAL BYPASS GRAFT Left 01/28/2014   Procedure:  FEMORAL-ANTERIOR TIBIAL ARTERY Bypass Graft utilizing composite gortex graft and vein graft;  Surgeon: [name], MD;  Location[facility] OR;  Service: Vascular;  Laterality: Left;  . LOWER EXTREMITY ANGIOGRAM Left 01/26/2014   Procedure: LOWER EXTREMITY ANGIOGRAM;  Surgeon: [name], MD;  Location: [facility] CATH LAB;  Service: Cardiovascular;  Laterality: Left;  . TEE WITHOUT CARDIOVERSION N/A 03/28/2014   Procedure: TRANSESOPHAGEAL ECHOCARDIOGRAM (TEE);  Surgeon: Quintella Reichert, MD;  Location: [facility] ENDOSCOPY;  Service: Cardiovascular;  Laterality: N/A;     Family History  Problem Relation Age of Onset  . Diabetes Mother   . Cancer Mother     Pancreatic  . Heart disease Mother     After age 49  . Hypertension Mother   . Hyperlipidemia Mother   . Diabetes Sister   . Cancer Sister   . Cancer Sister     Bone  . Cancer Father      History  Drug Use No     History  Alcohol Use No      History  Smoking Status  . Former Smoker  . Types: Pipe  . Quit date: 04/02/1979  Smokeless Tobacco  . Never Used     Outpatient Encounter Prescriptions as of 06/26/2016  Medication Sig Note  . acetaminophen (TYLENOL) 325 MG tablet Take 2 tablets (650 mg total) by mouth every 6 (six) hours as needed for mild pain (or Fever >/= 101).   [name] amLODipine (NORVASC) 10 MG tablet Take 10 mg by mouth daily.   [name] aspirin 81 MG tablet Take 81 mg by mouth daily.   . Cinnamon 500 MG TABS Take 1 tablet by mouth daily.   . furosemide (LASIX) 20 MG tablet Take 1 tablet by mouth daily.   [name] gabapentin (NEURONTIN) 300 MG capsule Take 300 mg by mouth 2 (two) times daily.    . insulin aspart (NOVOLOG) 100  UNIT/ML injection Inject 36 Units into the skin 2 (two) times daily as needed.   . insulin glargine (LANTUS) 100 UNIT/ML injection Inject 36 Units into the skin 2 (two) times daily.   . isosorbide mononitrate (IMDUR) 30 MG 24 hr tablet Take 30 mg by mouth daily.   [name] losartan (COZAAR) 100 MG tablet Take 100 mg by mouth daily. 03/20/2014: Received from: External Pharmacy Received Sig:   . metFORMIN (GLUCOPHAGE-XR) 500 MG 24 hr tablet Take 1,000 mg by mouth 2 (two) times daily.   . metoprolol (TOPROL-XL) 100 MG 24 hr tablet Take 100 mg by mouth 2 (two) times daily.     . nitroGLYCERIN (NITRODUR - DOSED IN MG/24 HR) 0.2 mg/hr patch  05/04/2014: Received from: External Pharmacy  . polyethylene glycol (MIRALAX / GLYCOLAX) packet Take 17 g by mouth daily as needed for mild constipation.   . rosuvastatin (CRESTOR) 40 MG tablet Take 40 mg by mouth every evening.   . [DISCONTINUED] insulin aspart (NOVOLOG) 100 UNIT/ML injection Inject 2-15 Units into the skin 3 (three) times daily with meals. Sliding Scale   . [DISCONTINUED] metFORMIN (GLUCOPHAGE) 1000 MG tablet Take 1,000 mg by mouth 2 (two) times daily with a meal.   . escitalopram (LEXAPRO) 10 MG tablet Take 10 mg by mouth at bedtime.  05/04/2014: Received from: [facility]  . [DISCONTINUED] albuterol (PROVENTIL) (2.5 MG/3ML) 0.083% nebulizer solution Take 3 mLs (2.5 mg total) by nebulization every 6 (six) hours as needed for wheezing.   . [DISCONTINUED] Amino Acids-Protein Hydrolys (FEEDING SUPPLEMENT, PRO-STAT SUGAR FREE 64,) LIQD Take 30 mLs by mouth 2 (two) times daily between meals.   . [DISCONTINUED] docusate sodium 100 MG CAPS Take 100 mg by mouth 2 (two) times daily.   . [DISCONTINUED] feeding supplement, GLUCERNA SHAKE, (GLUCERNA SHAKE) LIQD Take 237 mLs by mouth 2 (two) times daily between meals.   . [DISCONTINUED] Insulin Detemir (LEVEMIR [place]) Inject 25 Units into the skin 2 (two) times daily.   . [DISCONTINUED] insulin glargine (LANTUS) 100 UNIT/ML injection Inject 0.2 mLs (20 Units total) into the skin 2 (two) times daily. (Patient taking differently: Inject 27 Units into the skin 2 (two) times daily. )   . [DISCONTINUED] ondansetron (ZOFRAN) 4 MG tablet Take 1 tablet (4 mg total) by mouth every 6 (six) hours as needed for nausea.   . [DISCONTINUED] oxyCODONE (OXY IR/ROXICODONE) 5 MG immediate release tablet Take 1 tablet (5 mg total) by mouth every 4 (four) hours as needed for severe pain.   . [DISCONTINUED] OxyCODONE (OXYCONTIN) 20 mg T12A 12 hr tablet Take 20 mg by mouth as needed.   . [DISCONTINUED] pantoprazole (PROTONIX) 40 MG tablet Take 1 tablet (40 mg total) by mouth daily.   . [DISCONTINUED] silver sulfADIAZINE (SILVADENE) 1 % cream Apply 1 application topically daily. 03/20/2014: Received from: External Pharmacy Received Sig:    No facility-administered encounter medications on file as of 06/26/2016.     Allergies: Cilostazol  Body mass index is 35.84 kg/m.  Blood pressure 123/63, pulse (!) 59, height 5\' 9"  (1.753 m), weight 242 lb 11.2 oz (110.1 kg).   Review of Systems  Constitutional: Negative for activity change, appetite change, chills, diaphoresis, fatigue, fever and unexpected weight change.  HENT: Negative for  congestion.   Eyes: Negative for visual disturbance.  Respiratory: Positive for shortness of breath. Negative for cough, chest tightness, wheezing and stridor.        SOB with walking > 100 yards.  No SOB  at rest.  Cardiovascular: Negative for chest pain, palpitations and leg swelling.  Gastrointestinal: Negative for abdominal distention, abdominal pain, blood in stool, constipation, diarrhea, nausea and vomiting.  Endocrine: Negative for cold intolerance, heat intolerance, polydipsia, polyphagia and polyuria.  Genitourinary: Negative for difficulty urinating and flank pain.  Skin: Negative for color change, pallor, rash and wound.  Neurological: Positive for tremors. Negative for dizziness and weakness.       Reports recent short term memory difficulties over the last year.  Denies head trauma.  Psychiatric/Behavioral: Positive for confusion. Negative for agitation, decreased concentration, dysphoric mood, hallucinations, self-injury and suicidal ideas. The patient is not nervous/anxious and is not hyperactive.        Objective:   Physical Exam  Constitutional: He is oriented to person, place, and time. He appears well-developed and well-nourished. He appears distressed.  HENT:  Head: Normocephalic and atraumatic.  Right Ear: Hearing, tympanic membrane and external ear normal.  Left Ear: Hearing, tympanic membrane and external ear normal.  Nose: Nose normal.  Mouth/Throat: Uvula is midline.  Pulmonary/Chest: Effort normal and breath sounds normal. No respiratory distress. He has no wheezes. He has no rales. He exhibits no tenderness.  Abdominal: Soft. Bowel sounds are normal. He exhibits no distension and no mass. There is no tenderness. There is no rebound and no guarding.  Musculoskeletal: He exhibits deformity.  Left BKA-ambulates with prosthetic and cane  Neurological: He is alert and oriented to person, place, and time.  Skin: Skin is warm and dry. No rash noted. He is not  diaphoretic. No erythema. No pallor.  Psychiatric: He has a normal mood and affect. His behavior is normal. Judgment and thought content normal.          Assessment & Plan:   1. Health care maintenance     Health care maintenance Continue regular follow-up with pre-existing PCP Dr. Mikey Bussing. Please follow-up with pre-existing Cardiologist Dr. Jens Som. Recommend requesting Neurology referral from Dr. Mikey Bussing. Continue all medications as directed. Follow-up with [place] as needed.    FOLLOW-UP:  Return if symptoms worsen or fail to improve.

## 2016-06-26 NOTE — Assessment & Plan Note (Signed)
Continue regular follow-up with pre-existing PCP Dr. Mikey BussingHoffman. Please follow-up with pre-existing Cardiologist Dr. Jens Somrenshaw. Recommend requesting Neurology referral from Dr. Mikey BussingHoffman. Continue all medications as directed. Follow-up with us as needed.

## 2016-06-26 NOTE — Patient Instructions (Signed)
Heart-Healthy Eating Plan Many factors influence your heart health, including eating and exercise habits. Heart (coronary) risk increases with abnormal blood fat (lipid) levels. Heart-healthy meal planning includes limiting unhealthy fats, increasing healthy fats, and making other small dietary changes. This includes maintaining a healthy body weight to help keep lipid levels within a normal range. What is my plan? Your health care provider recommends that you:  Get no more than _________% of the total calories in your daily diet from fat.  Limit your intake of saturated fat to less than _________% of your total calories each day.  Limit the amount of cholesterol in your diet to less than _________ mg per day. What types of fat should I choose?  Choose healthy fats more often. Choose monounsaturated and polyunsaturated fats, such as olive oil and canola oil, flaxseeds, walnuts, almonds, and seeds.  Eat more omega-3 fats. Good choices include salmon, mackerel, sardines, tuna, flaxseed oil, and ground flaxseeds. Aim to eat fish at least two times each week.  Limit saturated fats. Saturated fats are primarily found in animal products, such as meats, butter, and cream. Plant sources of saturated fats include palm oil, palm kernel oil, and coconut oil.  Avoid foods with partially hydrogenated oils in them. These contain trans fats. Examples of foods that contain trans fats are stick margarine, some tub margarines, cookies, crackers, and other baked goods. What general guidelines do I need to follow?  Check food labels carefully to identify foods with trans fats or high amounts of saturated fat.  Fill one half of your plate with vegetables and green salads. Eat 4-5 servings of vegetables per day. A serving of vegetables equals 1 cup of raw leafy vegetables,  cup of raw or cooked cut-up vegetables, or  cup of vegetable juice.  Fill one fourth of your plate with whole grains. Look for the word  "whole" as the first word in the ingredient list.  Fill one fourth of your plate with lean protein foods.  Eat 4-5 servings of fruit per day. A serving of fruit equals one medium whole fruit,  cup of dried fruit,  cup of fresh, frozen, or canned fruit, or  cup of 100% fruit juice.  Eat more foods that contain soluble fiber. Examples of foods that contain this type of fiber are apples, broccoli, carrots, beans, peas, and barley. Aim to get 20-30 g of fiber per day.  Eat more home-cooked food and less restaurant, buffet, and fast food.  Limit or avoid alcohol.  Limit foods that are high in starch and sugar.  Avoid fried foods.  Cook foods by using methods other than frying. Baking, boiling, grilling, and broiling are all great options. Other fat-reducing suggestions include:  Removing the skin from poultry.  Removing all visible fats from meats.  Skimming the fat off of stews, soups, and gravies before serving them.  Steaming vegetables in water or broth.  Lose weight if you are overweight. Losing just 5-10% of your initial body weight can help your overall health and prevent diseases such as diabetes and heart disease.  Increase your consumption of nuts, legumes, and seeds to 4-5 servings per week. One serving of dried beans or legumes equals  cup after being cooked, one serving of nuts equals 1 ounces, and one serving of seeds equals  ounce or 1 tablespoon.  You may need to monitor your salt (sodium) intake, especially if you have high blood pressure. Talk with your health care provider or dietitian to get more  information about reducing sodium. What foods can I eat? Grains   Breads, including [place], white, pita, wheat, raisin, rye, oatmeal, and [place]. Tortillas that are neither fried nor made with lard or trans fat. Low-fat rolls, including hotdog and hamburger buns and English muffins. Biscuits. Muffins. Waffles. Pancakes. Light popcorn. Whole-grain cereals. Flatbread.  Melba toast. Pretzels. Breadsticks. Rusks. Low-fat snacks and crackers, including oyster, saltine, matzo, graham, animal, and rye. Rice and pasta, including [place] rice and those that are made with whole wheat. Vegetables  All vegetables. Fruits  All fruits, but limit coconut. Meats and Other Protein Sources  Lean, well-trimmed beef, veal, pork, and lamb. Chicken and [place] without skin. All fish and shellfish. Wild duck, rabbit, pheasant, and venison. Egg whites or low-cholesterol egg substitutes. Dried beans, peas, lentils, and tofu.Seeds and most nuts. Dairy  Low-fat or nonfat cheeses, including ricotta, string, and mozzarella. Skim or 1% milk that is liquid, powdered, or evaporated. Buttermilk that is made with low-fat milk. Nonfat or low-fat yogurt. Beverages  Mineral water. Diet carbonated beverages. Sweets and Desserts  Sherbets and fruit ices. Honey, jam, marmalade, jelly, and syrups. Meringues and gelatins. Pure sugar candy, such as hard candy, jelly beans, gumdrops, mints, marshmallows, and small amounts of dark chocolate. [employer] Eat all sweets and desserts in moderation. Fats and Oils  Nonhydrogenated (trans-free) margarines. Vegetable oils, including soybean, sesame, sunflower, olive, peanut, safflower, corn, canola, and cottonseed. Salad dressings or mayonnaise that are made with a vegetable oil. Limit added fats and oils that you use for cooking, baking, salads, and as spreads. Other  Cocoa powder. Coffee and tea. All seasonings and condiments. The items listed above may not be a complete list of recommended foods or beverages. Contact your dietitian for more options.  What foods are not recommended? Grains  Breads that are made with saturated or trans fats, oils, or whole milk. Croissants. Butter rolls. Cheese breads. Sweet rolls. Donuts. Buttered popcorn. Chow mein noodles. High-fat crackers, such as cheese or butter crackers. Meats and Other Protein Sources  Fatty  meats, such as hotdogs, short ribs, sausage, spareribs, bacon, ribeye roast or steak, and mutton. High-fat deli meats, such as salami and bologna. Caviar. Domestic duck and goose. Organ meats, such as kidney, liver, sweetbreads, brains, gizzard, chitterlings, and heart. Dairy  Cream, sour cream, cream cheese, and creamed cottage cheese. Whole milk cheeses, including blue (bleu), Monterey Jack, [place], [place], American, [place], Swiss, [place], [place], and [place]. Whole or 2% milk that is liquid, evaporated, or condensed. Whole buttermilk. Cream sauce or high-fat cheese sauce. Yogurt that is made from whole milk. Beverages  Regular sodas and drinks with added sugar. Sweets and Desserts  Frosting. Pudding. Cookies. Cakes other than angel food cake. Candy that has milk chocolate or white chocolate, hydrogenated fat, butter, coconut, or unknown ingredients. Buttered syrups. Full-fat ice cream or ice cream drinks. Fats and Oils  Gravy that has suet, meat fat, or shortening. Cocoa butter, hydrogenated oils, palm oil, coconut oil, palm kernel oil. These can often be found in baked products, candy, fried foods, nondairy creamers, and whipped toppings. Solid fats and shortenings, including bacon fat, salt pork, lard, and butter. Nondairy cream substitutes, such as coffee creamers and sour cream substitutes. Salad dressings that are made of unknown oils, cheese, or sour cream. The items listed above may not be a complete list of foods and beverages to avoid. Contact your dietitian for more information.  This information is not intended to replace advice given to you by  your health care provider. Make sure you discuss any questions you have with your health care provider. Document Released: 12/26/2007 Document Revised: 10/06/2015 Document Reviewed: 09/09/2013 Elsevier Interactive Patient Education  2017 Elsevier Inc.  Continue regular follow-up with pre-existing PCP Dr. Mikey BussingHoffman. Please follow-up with  pre-existing Cardiologist Dr. Jens Somrenshaw. Recommend requesting Neurology referral from Dr. Mikey BussingHoffman. Continue all medications as directed. Follow-up with us as needed.

## 2016-07-24 ENCOUNTER — Encounter: Payer: Self-pay | Admitting: Family

## 2016-07-31 ENCOUNTER — Ambulatory Visit (INDEPENDENT_AMBULATORY_CARE_PROVIDER_SITE_OTHER): Payer: Medicare Other | Admitting: Family

## 2016-07-31 ENCOUNTER — Encounter: Payer: Self-pay | Admitting: Family

## 2016-07-31 ENCOUNTER — Ambulatory Visit (HOSPITAL_COMMUNITY)
Admission: RE | Admit: 2016-07-31 | Discharge: 2016-07-31 | Disposition: A | Discharge status: Home / Self Care | Payer: Medicare Other | Source: Ambulatory Visit | Attending: Family | Admitting: Family

## 2016-07-31 VITALS — BP 121/55 | HR 63 | Temp 98.1°F | Resp 18 | Ht 69.0 in | Wt 240.0 lb

## 2016-07-31 DIAGNOSIS — I779 Disorder of arteries and arterioles, unspecified: Secondary | ICD-10-CM

## 2016-07-31 DIAGNOSIS — E1151 Type 2 diabetes mellitus with diabetic peripheral angiopathy without gangrene: Secondary | ICD-10-CM | POA: Diagnosis not present

## 2016-07-31 DIAGNOSIS — Z89512 Acquired absence of left leg below knee: Secondary | ICD-10-CM | POA: Diagnosis not present

## 2016-07-31 DIAGNOSIS — E1165 Type 2 diabetes mellitus with hyperglycemia: Secondary | ICD-10-CM | POA: Diagnosis not present

## 2016-07-31 DIAGNOSIS — Z87891 Personal history of nicotine dependence: Secondary | ICD-10-CM | POA: Diagnosis not present

## 2016-07-31 DIAGNOSIS — IMO0002 Reserved for concepts with insufficient information to code with codable children: Secondary | ICD-10-CM

## 2016-07-31 DIAGNOSIS — I872 Venous insufficiency (chronic) (peripheral): Secondary | ICD-10-CM

## 2016-07-31 NOTE — Patient Instructions (Signed)

## 2016-07-31 NOTE — Progress Notes (Signed)
[facility] [place]   CC: Follow up peripheral artery occlusive disease  History of Present Illness Jordan [name] is a 76 y.o. male patient of Dr. Edilia Bo who had presented with an extensive diabetic foot infection of the left foot. He had extensive infrainguinal arterial occlusive disease and was not a candidate for an endovascular approach. He underwent left common femoral artery to anterior tibial artery bypass with a composite PTFE left greater saphenous vein graft on 01/28/2014. 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 Subsequently the foot wound was managed by Dr. Aldean Baker. 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  Dr. Edilia Bo last saw pt on 01/11/15. At that time the wounds on his right foot had healed. He does have some dry skin and Dr. Edilia Bo encouraged him to keep the skin well lubricated and to use nystatin cream on the fungal infection on the dorsum of his foot. Given that he has reasonable Doppler flow in the foot, and a toe pressure of 78 mmHg,  Dr. Edilia Bo would not recommend addressing the disease of his tibial peroneal trunk unless pt developed a nonhealing wound and clearly had a limb threatening situation. Based on his previous arteriogram that would be the only option and would be associated with significant risk given his small calcified vessels. Pt is to return in 6 months with follow up ABIs. He knows to call sooner if he has problems.   Pt has been treated at [facility] wound care center for venous stasis wounds and wife states a skin bx proved was a fungal and staph infection which was treated. By wife's description of weekly leg wrapping, he seemed to have unna boot tx.   Wife has been applying a topical combination of Eucerin and what sounds like mupirocin to his right lower leg where epidermal layer was exposed in several areas, state is has helped.   Pt denies any hx of stroke or TIA. He walks with a cane,  denies claudication symptoms in right leg, scaling in right heel continues, macerated area between right 4th and 5th toes resolved with Lamisil use. His walking is limited by dyspnea; he can walk about 100 feet before he has to stop.   Pt Diabetic: Yes, pt states his last A1C was 8.3, states he is seeing a dietician regularly.  Pt smoker: former smoker, quit in the 1980's  Pt meds include: Statin :Yes Betablocker: Yes ASA: Yes Other anticoagulants/antiplatelets: no     Past Medical History:  Diagnosis Date  . Anemia   . CAD (coronary artery disease)    a. s/p IMI in past tx with POBA and cath 6 mos later with occluded RCA;  b. h/o Taxus DES to LAD and CFX;  c.  cath 1/10: LM ok, mLAD 40-50%, LAD stent ok, D2 tandem 60-70%, pCFX 30%, mAVCFX stent ok, pRCA 40%, RV 90%, RV marginal 90%, dRCA filled L-R collats tx medically  . Carotid stenosis    dopplers 5/11: 40-59% bilat  . CHF (congestive heart failure) (HCC)   . Diabetes mellitus   . Diabetic ulcer of left foot (HCC)   . DM2 (diabetes mellitus, type 2) (HCC)   . Heart murmur   . HLD (hyperlipidemia)   . HTN (hypertension)   . Myocardial infarction (HCC) 1995 and 2005   Heart Attack  . Osteomyelitis of left foot (HCC)   . Staphylococcus aureus bacteremia with sepsis [facility])     Social  History Social History  Substance Use Topics  . Smoking status: Former Smoker    Types: Pipe    Quit date: 04/02/1979  . Smokeless tobacco: Never Used  . Alcohol use No    Family History Family History  Problem Relation Age of Onset  . Diabetes Mother   . Cancer Mother     Pancreatic  . Heart disease Mother     After age 2  . Hypertension Mother   . Hyperlipidemia Mother   . Diabetes Sister   . Cancer Sister   . Cancer Sister     Bone  . Cancer Father     Past Surgical History:  Procedure Laterality Date  . ABDOMINAL AORTAGRAM N/A 04/07/2013   Procedure: ABDOMINAL [name];  Surgeon: [name], MD;  Location: [facility] CATH  LAB;  Service: Cardiovascular;  Laterality: N/A;  . ABDOMINAL AORTAGRAM N/A 01/26/2014   Procedure: ABDOMINAL [name];  Surgeon: [name], MD;  Location: [facility] CATH LAB;  Service: Cardiovascular;  Laterality: N/A;  . ABDOMINAL AORTAGRAM N/A 06/13/2014   Procedure: ABDOMINAL [name];  Surgeon: [name], MD;  Location: [facility] CATH LAB;  Service: Cardiovascular;  Laterality: N/A;  . AMPUTATION Left 01/28/2014   Procedure: AMPUTATION DIGIT- LEFT 5TH TOE;  Surgeon: [name], MD;  Location: [facility] OR;  Service: Vascular;  Laterality: Left;  . AMPUTATION Left 02/02/2014   Procedure: Left 4th Ray Amputation vs Transmetatarsal Amputation;  Surgeon: [name], MD;  Location: [facility] OR;  Service: Orthopedics;  Laterality: Left;  . AMPUTATION Left 03/24/2014   Procedure: AMPUTATION BELOW KNEE;  Surgeon: [name], MD;  Location: [facility] OR;  Service: Orthopedics;  Laterality: Left;  . APPENDECTOMY    . CARDIAC CATHETERIZATION    . CORONARY ANGIOPLASTY WITH STENT PLACEMENT    . FEMORAL-TIBIAL BYPASS GRAFT Left 01/28/2014   Procedure:  FEMORAL-ANTERIOR TIBIAL ARTERY Bypass Graft utilizing composite gortex graft and vein graft;  Surgeon: [name], MD;  Location: [facility] OR;  Service: Vascular;  Laterality: Left;  . LOWER EXTREMITY ANGIOGRAM Left 01/26/2014   Procedure: LOWER EXTREMITY ANGIOGRAM;  Surgeon: [name], MD;  Location: [facility] CATH LAB;  Service: Cardiovascular;  Laterality: Left;  . TEE WITHOUT CARDIOVERSION N/A 03/28/2014   Procedure: TRANSESOPHAGEAL ECHOCARDIOGRAM (TEE);  Surgeon: Quintella Reichert, MD;  Location: [facility] ENDOSCOPY;  Service: Cardiovascular;  Laterality: N/A;    Allergies  Allergen Reactions  . Cilostazol Other (See Comments)    Feels like feet on fire    Current Outpatient Prescriptions  Medication Sig Dispense Refill  . acetaminophen (TYLENOL) 325 MG tablet Take 2 tablets (650 mg total) by mouth every 6 (six) hours as needed for mild  pain (or Fever >/= 101).    [name] amLODipine (NORVASC) 10 MG tablet Take 10 mg by mouth daily.    [name] aspirin 81 MG tablet Take 81 mg by mouth daily.    . Cinnamon 500 MG TABS Take 1 tablet by mouth daily.    . furosemide (LASIX) 20 MG tablet Take 1 tablet by mouth daily.    [name] gabapentin (NEURONTIN) 300 MG capsule Take 300 mg by mouth 2 (two) times daily.     . insulin aspart (NOVOLOG) 100 UNIT/ML injection Inject 36 Units into the skin 2 (two) times daily as needed.    . insulin glargine (LANTUS) 100 UNIT/ML injection Inject 36 Units into the skin 2 (two) times daily.    . isosorbide mononitrate (IMDUR) 30 MG 24 hr tablet  Take 30 mg by mouth daily.    [name] losartan (COZAAR) 100 MG tablet Take 100 mg by mouth daily.    . metFORMIN (GLUCOPHAGE-XR) 500 MG 24 hr tablet Take 1,000 mg by mouth 2 (two) times daily.    . metoprolol (TOPROL-XL) 100 MG 24 hr tablet Take 100 mg by mouth 2 (two) times daily.      . nitroGLYCERIN (NITRODUR - DOSED IN MG/24 HR) 0.2 mg/hr patch   3  . polyethylene glycol (MIRALAX / GLYCOLAX) packet Take 17 g by mouth daily as needed for mild constipation. 14 each 0  . rosuvastatin (CRESTOR) 40 MG tablet Take 40 mg by mouth every evening.    . escitalopram (LEXAPRO) 10 MG tablet Take 10 mg by mouth at bedtime.      No current facility-administered medications for this visit.     ROS: See HPI for pertinent positives and negatives.   Physical Examination  Vitals:   07/31/16 1434  BP: (!) 121/55  Pulse: 63  Resp: 18  Temp: 98.1 F (36.7 C)  SpO2: 96%  Weight: 240 lb (108.9 kg)  Height:  (1.753 m)   Body mass index is 35.44 kg/m.  General: A&O x 3, WDWN, obese male. Gait: slow, deliberate, using cane Eyes: pupils equal. Pulmonary: Respirations are non labored, slightly diminished air movement, CTAB, without wheezes , rales or rhonchi. Cardiac: regular rhythm, no detected murmur.     Carotid Bruits Right Left   Negative Negative  Aorta is  not palpable. Radial pulses are palpable.   VASCULAR EXAM: Extremities  left BKA with prosthesis in place. Right LE: Right heel with hyperkeratotic lesions where there had been ulcers. No gangrene. Healing dermatitis encircling right ankle and at dorsum of foot. No open wounds. 1+ pitting and non pitting edema in right foot and ankle.       LE Pulses Right Left   FEMORAL not palpable(obese) Faintly  palpable     POPLITEAL not palpable  not palpable   POSTERIOR TIBIAL Monophasic Doppler signal  BKA    DORSALIS PEDIS  ANTERIOR TIBIAL Monophasic Doppler signal  BKA    Abdomen: soft, NT, no palpable masses. Skin: no rashes, see extremities. Musculoskeletal: no muscle wasting or atrophy. Left BKA. Neurologic: A&O X 3; Appropriate Affect, MOTOR FUNCTION: moving all extremities equally, motor strength 4/5 throughout. Speech is fluent/normal.  CN 2-12 is grossly intact     Non-Invasive Vascular Imaging: DATE: 07/31/2016 ABI:  RIGHT: 0.69 (0.83, 07-12-15), Waveforms: monophasic; TBI: 0.38 (was 0.35, 07-12-15)   LEFT: BKA   ASSESSMENT: Kane Kusek is a 76 y.o. male who is s/p left common femoral artery to anterior tibial artery bypass with a composite PTFE left greater saphenous vein graft on 01/28/2014. 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 Subsequently the foot wound was managed by Dr. Aldean Baker. 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 Dr. Edilia Bo would not recommend addressing the disease of his tibial peroneal trunk unless pt developed a nonhealing wound and clearly had a limb threatening situation. Based on his previous arteriogram that would be the only option and would be associated with significant risk given his small calcified  vessels.   Pt's primary atherosclerotic risk factor remains his uncontrolled DM; he is working closely with his doctor to get this under control. He is also obese and a former smoker, quit in the 1980's.  Today's right ABI seems to have declined compared to a  year ago; but ABI a year ago may have been falsely elevated as monophasic waveforms do not correspond to 83% arterial perfusion; today's ABI of 0.69 corresponds to monophasic waveforms.  He has known calcified tibial trunk vessels c/w long term uncontrolled DM.  He has chronic venous insufficiency with a history of stasis ulcers that apparently became infected with a fungal infection and staph, treated by the wound care center at [facility] with weekly medicated compression bandage therapy which pt and wife states healed his stasis ulcers encircling his right ankle and at the dorsal aspect of his foot. He sits in his recliner during the day which does not elevate his foot above his heart, and he is developing recurrent dependent edema; see Plan.  PLAN:  Script given for rolling walker with seat.  Based on the patient's vascular studies and examination, pt will return to clinic in 3 months with ABI's. I advised him and his wife to notify us or [place] wound care center if he develops non healing wounds.  Chronic venous insufficiency - Pt and wife instructed in adequate elevation of his right leg (at least 3x/day for 20 minutes and overnight) to minimize edema from venous insufficiency.  Seated daily right leg exercises discussed and demonstrated.    I discussed in depth with the patient the nature of atherosclerosis, and emphasized the importance of maximal medical management including strict control of blood pressure, blood glucose, and lipid levels, obtaining regular exercise, and continued cessation of smoking.  The patient is aware that without maximal medical management the underlying atherosclerotic disease process will  progress, limiting the benefit of any interventions.  The patient was given information about PAD including signs, symptoms, treatment, what symptoms should prompt the patient to seek immediate medical care, and risk reduction measures to take.  Charisse March, RN, MSN, FNP-C [facility] [place] Phone: [phone]  Clinic MD: Edilia Bo  07/31/16 2:46 PM

## 2016-08-05 NOTE — Addendum Note (Signed)
Addended by: Burton ApleyPETTY, Monte Zinni A on: 08/05/2016 09:44 AM   Modules accepted: Orders

## 2016-10-22 ENCOUNTER — Encounter: Payer: Self-pay | Admitting: Family

## 2016-11-06 ENCOUNTER — Ambulatory Visit: Payer: Medicare Other | Admitting: Family

## 2016-11-06 ENCOUNTER — Encounter (HOSPITAL_COMMUNITY): Payer: Medicare Other

## 2016-12-04 ENCOUNTER — Other Ambulatory Visit (HOSPITAL_BASED_OUTPATIENT_CLINIC_OR_DEPARTMENT_OTHER): Payer: Self-pay

## 2016-12-04 DIAGNOSIS — R454 Irritability and anger: Secondary | ICD-10-CM

## 2016-12-04 DIAGNOSIS — R5383 Other fatigue: Secondary | ICD-10-CM

## 2016-12-04 DIAGNOSIS — G4710 Hypersomnia, unspecified: Secondary | ICD-10-CM

## 2016-12-04 DIAGNOSIS — G4730 Sleep apnea, unspecified: Secondary | ICD-10-CM

## 2016-12-04 DIAGNOSIS — G4700 Insomnia, unspecified: Secondary | ICD-10-CM

## 2016-12-04 DIAGNOSIS — R0683 Snoring: Secondary | ICD-10-CM

## 2016-12-15 ENCOUNTER — Ambulatory Visit (HOSPITAL_BASED_OUTPATIENT_CLINIC_OR_DEPARTMENT_OTHER): Payer: Medicare Other | Attending: Internal Medicine | Admitting: Internal Medicine

## 2016-12-15 VITALS — Ht 69.0 in | Wt 240.0 lb

## 2016-12-15 DIAGNOSIS — G4700 Insomnia, unspecified: Secondary | ICD-10-CM

## 2016-12-15 DIAGNOSIS — G4733 Obstructive sleep apnea (adult) (pediatric): Secondary | ICD-10-CM

## 2016-12-15 DIAGNOSIS — R0683 Snoring: Secondary | ICD-10-CM | POA: Insufficient documentation

## 2016-12-15 DIAGNOSIS — R454 Irritability and anger: Secondary | ICD-10-CM | POA: Insufficient documentation

## 2016-12-15 DIAGNOSIS — R400 Somnolence: Secondary | ICD-10-CM | POA: Insufficient documentation

## 2016-12-15 DIAGNOSIS — G4710 Hypersomnia, unspecified: Secondary | ICD-10-CM

## 2016-12-15 DIAGNOSIS — G4730 Sleep apnea, unspecified: Secondary | ICD-10-CM

## 2016-12-15 DIAGNOSIS — R5383 Other fatigue: Secondary | ICD-10-CM | POA: Diagnosis not present

## 2016-12-21 DIAGNOSIS — R0683 Snoring: Secondary | ICD-10-CM | POA: Diagnosis not present

## 2016-12-21 NOTE — Procedures (Signed)
Patient Name: Jordan [name], Jordan [name] [name] Date: 12/15/2016 Gender: Male D.O.B: 23-Jul-1940 Age (years): 51 Referring Provider: Raelene Bott MD Height (inches): 69 Interpreting Physician: Baird Lyons MD, ABSM Weight (lbs): 240 RPSGT: Madelon Lips BMI: 35 MRN: [id number] Neck Size: 18.00 CLINICAL INFORMATION Sleep Study Type: Split Night CPAP  Indication for sleep study: Excessive Daytime Sleepiness, Fatigue, Snoring, Witnessed Apneas  Epworth Sleepiness Score:  12/24  SLEEP STUDY TECHNIQUE As per the AASM Manual for the Scoring of Sleep and Associated Events v2.3 (April 2016) with a hypopnea requiring 4% desaturations.  The channels recorded and monitored were frontal, central and occipital EEG, electrooculogram (EOG), submentalis EMG (chin), nasal and oral airflow, thoracic and abdominal wall motion, anterior tibialis EMG, snore microphone, electrocardiogram, and pulse oximetry. Continuous positive airway pressure (CPAP) was initiated when the patient met split night criteria and was titrated according to treat sleep-disordered breathing.  MEDICATIONS Medications self-administered by patient taken the night of the study : none reported  RESPIRATORY PARAMETERS Diagnostic  Total AHI (/hr): 71.3 RDI (/hr): 72.2 OA Index (/hr): 14.5 CA Index (/hr): 0.0 REM AHI (/hr): N/A NREM AHI (/hr): 71.3 Supine AHI (/hr): 88.4 Non-supine AHI (/hr): 69.66 Min O2 Sat (%): 75.00 Mean O2 (%): 91.96 Time below 88% (min): 25.5   Titration  Optimal Pressure (cm): 12 AHI at Optimal Pressure (/hr): 3.6 Min O2 at Optimal Pressure (%): 89.0 Supine % at Optimal (%): 0 Sleep % at Optimal (%): 98   SLEEP ARCHITECTURE The recording time for the entire night was 393.4 minutes.  During a baseline period of 234.9 minutes, the patient slept for 128.0 minutes in REM and nonREM, yielding a sleep efficiency of 54.5%. Sleep onset after lights out was 34.7 minutes with a REM latency of N/A minutes. The patient spent  63.28% of the night in stage N1 sleep, 36.72% in stage N2 sleep, 0.00% in stage N3 and 0.00% in REM.  During the titration period of 152.9 minutes, the patient slept for 113.0 minutes in REM and nonREM, yielding a sleep efficiency of 73.9%. Sleep onset after CPAP initiation was 6.7 minutes with a REM latency of N/A minutes. The patient spent 27.43% of the night in stage N1 sleep, 72.57% in stage N2 sleep, 0.00% in stage N3 and 0.00% in REM.  CARDIAC DATA The 2 lead EKG demonstrated sinus rhythm. The mean heart rate was 56.64 beats per minute. Other EKG findings include: PVCs.  LEG MOVEMENT DATA The total Periodic Limb Movements of Sleep (PLMS) were 0. The PLMS index was 0.00 .  IMPRESSIONS - Severe obstructive sleep apnea occurred during the diagnostic portion of the study (AHI = 71.3/hour). An optimal PAP pressure was selected for this patient ( 12 cm of water) - No significant central sleep apnea occurred during the diagnostic portion of the study (CAI = 0.0/hour). - Severe oxygen desaturation was noted during the diagnostic portion of the study (Min O2 = 75.00%). Mean at final CPAP 91%. - The patient snored with Loud snoring volume during the diagnostic portion of the study. - EKG findings include PVCs. - Clinically significant periodic limb movements did not occur during sleep.  DIAGNOSIS - Obstructive Sleep Apnea (327.23 [G47.33 ICD-10])  RECOMMENDATIONS - Trial of CPAP therapy on 12 cm H2O with a Medium size Resmed Full Face Mask AirFit F20 mask and heated humidification. - Avoid alcohol, sedatives and other CNS depressants that may worsen sleep apnea and disrupt normal sleep architecture. - Sleep hygiene should be reviewed to assess factors that may improve sleep  quality. - Weight management and regular exercise should be initiated or continued.  [Electronically signed] 12/21/2016 10:05 AM  Baird Lyons MD, ABSM Diplomate, American Board of Sleep Medicine   NPI:  [id number]  [name], American Board of Sleep Medicine  ELECTRONICALLY SIGNED ON:  12/21/2016, 10:05 AM [place] PH: [phone]   FX: [phone] [place]

## 2017-11-24 NOTE — Progress Notes (Signed)
  Referring-Byron Jay Hoffman MD Reason for referral-CAD  HPI: 76-year-old male for evaluation of CAD at request of Byron Jay Hoffman MD. Last cardiac catheterization was performed on April 18, 2008. At that time, he was found to have normal left main. The LAD had a 40-50% lesion after the second diagonal, but the stent was widely patent. In the second diagonal, there was a 60-70% lesion, which did not appear to be flow limiting. There was a 30% circumflex. The right coronary artery was occluded. The distal right filled via left-to-right collaterals. The ejection fraction was 50%. Medical therapy was recommended. Patient seen in this office previously but not since 2015.  Patient was recently evaluated in April of this year for dyspnea by primary care.  A nuclear study was performed at [facility] and by report there was ischemia versus attenuation artifact in the inferior wall as well as ischemia in the anterior and apical wall.  Ejection fraction 51%.  Echocardiogram showed ejection fraction >55% and mild aortic stenosis (mean gradient 13 mmHg).  Carotid Dopplers showed 50 to 69% bilateral stenosis.  He also had abnormal ABIs in the right lower extremity.  Cardiology asked to evaluate.  Patient does have dyspnea on exertion but denies orthopnea, PND, chest pain or syncope.  He does have mild edema right lower extremity.  Current Outpatient Medications  Medication Sig Dispense Refill  . acetaminophen (TYLENOL) 325 MG tablet Take 2 tablets (650 mg total) by mouth every 6 (six) hours as needed for mild pain (or Fever >/= 101).    . amLODipine (NORVASC) 10 MG tablet Take 10 mg by mouth daily.    . aspirin 81 MG tablet Take 81 mg by mouth daily.    . Cinnamon 500 MG TABS Take 1 tablet by mouth daily.    . furosemide (LASIX) 20 MG tablet Take 1 tablet by mouth daily.    . gabapentin (NEURONTIN) 300 MG capsule Take 300 mg by mouth 2 (two) times daily.     . insulin aspart (NOVOLOG) 100 UNIT/ML  injection Inject 36 Units into the skin 2 (two) times daily as needed.    . insulin glargine (LANTUS) 100 UNIT/ML injection Inject 36 Units into the skin 2 (two) times daily.    . isosorbide mononitrate (IMDUR) 30 MG 24 hr tablet Take 30 mg by mouth daily.    . liraglutide (VICTOZA) 18 MG/3ML SOPN Begin with 0.6 mg daily and advance to 1.8 mg daily as directed    . losartan (COZAAR) 100 MG tablet Take 100 mg by mouth daily.    . metoprolol (TOPROL-XL) 100 MG 24 hr tablet Take 100 mg by mouth 2 (two) times daily.      . nitroGLYCERIN (NITRODUR - DOSED IN MG/24 HR) 0.2 mg/hr patch   3  . polyethylene glycol (MIRALAX / GLYCOLAX) packet Take 17 g by mouth daily as needed for mild constipation. 14 each 0  . rosuvastatin (CRESTOR) 40 MG tablet Take 40 mg by mouth every evening.    . Vitamin D, Ergocalciferol, (DRISDOL) 50000 units CAPS capsule Take 1 capsule by mouth every 30 (thirty) days.    . escitalopram (LEXAPRO) 10 MG tablet Take 10 mg by mouth at bedtime.      No current facility-administered medications for this visit.     Allergies  Allergen Reactions  . Cilostazol Other (See Comments)    Feels like feet on fire Other reaction(s): Other (See Comments) Pain in feet     Past Medical   History:  Diagnosis Date  . Anemia   . Aortic stenosis   . CAD (coronary artery disease)    a. s/p IMI in past tx with POBA and cath 6 mos later with occluded RCA;  b. h/o Taxus DES to LAD and CFX;  c.  cath 1/10: LM ok, mLAD 40-50%, LAD stent ok, D2 tandem 60-70%, pCFX 30%, mAVCFX stent ok, pRCA 40%, RV 90%, RV marginal 90%, dRCA filled L-R collats tx medically  . Carotid stenosis    dopplers 5/11: 40-59% bilat  . CHF (congestive heart failure) (HCC)   . Diabetic ulcer of left foot (HCC)   . DM2 (diabetes mellitus, type 2) (HCC)   . Heart murmur   . HLD (hyperlipidemia)   . HTN (hypertension)   . Myocardial infarction (HCC) 1995 and 2005   Heart Attack  . Osteomyelitis of left foot (HCC)   .  Staphylococcus aureus bacteremia with sepsis (HCC)     Past Surgical History:  Procedure Laterality Date  . ABDOMINAL AORTAGRAM N/A 04/07/2013   Procedure: ABDOMINAL AORTAGRAM;  Surgeon: [name], MD;  [facility];  Service: Cardiovascular;  Laterality: N/A;  . ABDOMINAL AORTAGRAM N/A 01/26/2014   Procedure: ABDOMINAL AORTAGRAM;  Surgeon: [name], MD;  [facility]: [facility];  Service: Cardiovascular;  Laterality: N/A;  . ABDOMINAL AORTAGRAM N/A 06/13/2014   Procedure: ABDOMINAL AORTAGRAM;  Surgeon: Christopher S Dickson, MD;  [facility]: [facility];  Service: Cardiovascular;  Laterality: N/A;  . AMPUTATION Left 01/28/2014   Procedure: AMPUTATION DIGIT- LEFT 5TH TOE;  Surgeon: Christopher S Dickson, MD;  Location: [facility] OR;  Service: Vascular;  Laterality: Left;  . AMPUTATION Left 02/02/2014   Procedure: Left 4th Ray Amputation vs Transmetatarsal Amputation;  Surgeon: Marcus Duda V, MD;  Location: [facility] OR;  Service: Orthopedics;  Laterality: Left;  . AMPUTATION Left 03/24/2014   Procedure: AMPUTATION BELOW KNEE;  Surgeon: Gregory Scott Dean, MD;  [facility]: [facility] OR;  Service: Orthopedics;  Laterality: Left;  . APPENDECTOMY    . CARDIAC CATHETERIZATION    . CORONARY ANGIOPLASTY WITH STENT PLACEMENT    . FEMORAL-TIBIAL BYPASS GRAFT Left 01/28/2014   Procedure:  FEMORAL-ANTERIOR TIBIAL ARTERY Bypass Graft utilizing composite gortex graft and vein graft;  Surgeon: Christopher S Dickson, MD;  [facility]: [facility] OR;  Service: Vascular;  Laterality: Left;  . LOWER EXTREMITY ANGIOGRAM Left 01/26/2014   Procedure: LOWER EXTREMITY ANGIOGRAM;  Surgeon: [name], MD;  [facility];  Service: Cardiovascular;  Laterality: Left;  . TEE WITHOUT CARDIOVERSION N/A 03/28/2014   Procedure: TRANSESOPHAGEAL ECHOCARDIOGRAM (TEE);  Surgeon: Traci R Turner, MD;  [facility]: [facility] ENDOSCOPY;  Service: Cardiovascular;  Laterality: N/A;    Social History   Socioeconomic History  . Marital  status: Married    Spouse name: Not on file  . Number of children: 1  . Years of education: Not on file  . Highest education level: Not on file  Occupational History  . Not on file  Social Needs  . Financial resource strain: Not on file  . Food insecurity:    Worry: Not on file    Inability: Not on file  . Transportation needs:    Medical: Not on file    Non-medical: Not on file  Tobacco Use  . Smoking status: Former Smoker    Types: Pipe    Last attempt to quit: 04/02/1979    Years since quitting: 38.6  . Smokeless tobacco: Never Used  Substance and Sexual   Activity  . Alcohol use: No    Alcohol/week: 0.0 standard drinks  . Drug use: No  . Sexual activity: Yes    Birth control/protection: None  Lifestyle  . Physical activity:    Days per week: Not on file    Minutes per session: Not on file  . Stress: Not on file  Relationships  . Social connections:    Talks on phone: Not on file    Gets together: Not on file    Attends religious service: Not on file    Active member of club or organization: Not on file    Attends meetings of clubs or organizations: Not on file    Relationship status: Not on file  . Intimate partner violence:    Fear of current or ex partner: Not on file    Emotionally abused: Not on file    Physically abused: Not on file    Forced sexual activity: Not on file  Other Topics Concern  . Not on file  Social History Narrative  . Not on file    Family History  Problem Relation Age of Onset  . Diabetes Mother   . Cancer Mother        Pancreatic  . Heart disease Mother        After age 60  . Hypertension Mother   . Hyperlipidemia Mother   . Diabetes Sister   . Cancer Sister   . Cancer Sister        Bone  . Cancer Father     ROS: no fevers or chills, productive cough, hemoptysis, dysphasia, odynophagia, melena, hematochezia, dysuria, hematuria, rash, seizure activity, orthopnea, PND, claudication. Remaining systems are negative.  Physical  Exam:   Blood pressure 138/78, pulse 64, height 5' 9" (1.753 m), weight 247 lb 3.2 oz (112.1 kg).  General:  Well developed/well nourished in NAD Skin warm/dry Patient not depressed No peripheral clubbing Back-normal HEENT-normal/normal eyelids Neck supple/normal carotid upstroke bilaterally; no bruits; no JVD; no thyromegaly chest - CTA/ normal expansion CV - RRR/normal S1 and S2; no rubs or gallops;  PMI nondisplaced, 2/6 systolic murmur left sternal border.  S2 is not diminished. Abdomen -NT/ND, no HSM, no mass, + bowel sounds, no bruit 2+ femoral pulses, no bruits Ext-1+ edema on the right, status post amputation on the left. Neuro-grossly nonfocal  ECG -sinus rhythm at a rate of 64.  Prior inferior infarct.  Lateral T wave inversion.  Personally reviewed  A/P  1 dyspnea-patient describes worsening dyspnea on exertion.  He has had an evaluation at [facility] recently.  LV function is normal with mild aortic stenosis.  Nuclear study suggest anterior and inferior ischemia.  Plan definitive evaluation with cardiac catheterization.  The risks and benefits including myocardial infarction, CVA and death discussed and he agrees to proceed.  We also discussed the possibility of contrast nephropathy.  He has baseline stage III renal insufficiency.  Hold Lasix the day prior to and the day of his procedure.  No ventriculogram.  Follow renal function after procedure.  I would like a right heart catheterization as well given mild volume excess on examination and dyspnea.  2 coronary artery disease-plan to continue aspirin and statin.  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  4 history of mild aortic stenosis-patient will need follow-up echoes in the future.  5 chronic stage III renal insufficiency-last creatinine 1.4.  As outlined above we will hold Lasix the day prior to and the day of the   procedure.  Gentle hydration prior to procedure.  No  ventriculogram.  Follow renal function afterwards.  6 hypertension-blood pressure is borderline.  Continue present medications and follow.  7 hyperlipidemia-continue statin.  8 peripheral vascular disease-continue aspirin and statin.  We will arrange follow-up with Dr. Arida.  Ronney Honeywell, MD  

## 2017-11-24 NOTE — H&P (View-Only) (Signed)
Referring-Byron Larwance Rote MD Reason for referral-CAD  HPI: 78 year old male for evaluation of CAD at request of Jarrett Ables MD. Last cardiac catheterization was performed on April 18, 2008. At that time, he was found to have normal left main. The LAD had a 40-50% lesion after the second diagonal, but the stent was widely patent. In the second diagonal, there was a 60-70% lesion, which did not appear to be flow limiting. There was a 30% circumflex. The right coronary artery was occluded. The distal right filled via left-to-right collaterals. The ejection fraction was 50%. Medical therapy was recommended. Patient seen in this office previously but not since 2015.  Patient was recently evaluated in April of this year for dyspnea by primary care.  A nuclear study was performed at [facility] and by report there was ischemia versus attenuation artifact in the inferior wall as well as ischemia in the anterior and apical wall.  Ejection fraction 51%.  Echocardiogram showed ejection fraction >55% and mild aortic stenosis (mean gradient 13 mmHg).  Carotid Dopplers showed 50 to 69% bilateral stenosis.  He also had abnormal ABIs in the right lower extremity.  Cardiology asked to evaluate.  Patient does have dyspnea on exertion but denies orthopnea, PND, chest pain or syncope.  He does have mild edema right lower extremity.  Current Outpatient Medications  Medication Sig Dispense Refill  . acetaminophen (TYLENOL) 325 MG tablet Take 2 tablets (650 mg total) by mouth every 6 (six) hours as needed for mild pain (or Fever >/= 101).    [name] amLODipine (NORVASC) 10 MG tablet Take 10 mg by mouth daily.    [name] aspirin 81 MG tablet Take 81 mg by mouth daily.    . Cinnamon 500 MG TABS Take 1 tablet by mouth daily.    . furosemide (LASIX) 20 MG tablet Take 1 tablet by mouth daily.    [name] gabapentin (NEURONTIN) 300 MG capsule Take 300 mg by mouth 2 (two) times daily.     . insulin aspart (NOVOLOG) 100 UNIT/ML  injection Inject 36 Units into the skin 2 (two) times daily as needed.    . insulin glargine (LANTUS) 100 UNIT/ML injection Inject 36 Units into the skin 2 (two) times daily.    . isosorbide mononitrate (IMDUR) 30 MG 24 hr tablet Take 30 mg by mouth daily.    [name] liraglutide (VICTOZA) 18 MG/3ML SOPN Begin with 0.6 mg daily and advance to 1.8 mg daily as directed    . losartan (COZAAR) 100 MG tablet Take 100 mg by mouth daily.    . metoprolol (TOPROL-XL) 100 MG 24 hr tablet Take 100 mg by mouth 2 (two) times daily.      . nitroGLYCERIN (NITRODUR - DOSED IN MG/24 HR) 0.2 mg/hr patch   3  . polyethylene glycol (MIRALAX / GLYCOLAX) packet Take 17 g by mouth daily as needed for mild constipation. 14 each 0  . rosuvastatin (CRESTOR) 40 MG tablet Take 40 mg by mouth every evening.    . Vitamin D, Ergocalciferol, (DRISDOL) 50000 units CAPS capsule Take 1 capsule by mouth every 30 (thirty) days.    [name] escitalopram (LEXAPRO) 10 MG tablet Take 10 mg by mouth at bedtime.      No current facility-administered medications for this visit.     Allergies  Allergen Reactions  . Cilostazol Other (See Comments)    Feels like feet on fire Other reaction(s): Other (See Comments) Pain in feet     Past Medical  History:  Diagnosis Date  . Anemia   . Aortic stenosis   . CAD (coronary artery disease)    a. s/p IMI in past tx with POBA and cath 6 mos later with occluded RCA;  b. h/o Taxus DES to LAD and CFX;  c.  cath 1/10: LM ok, mLAD 40-50%, LAD stent ok, D2 tandem 60-70%, pCFX 30%, mAVCFX stent ok, pRCA 40%, RV 90%, RV marginal 90%, dRCA filled L-R collats tx medically  . Carotid stenosis    dopplers 5/11: 40-59% bilat  . CHF (congestive heart failure) (HCC)   . Diabetic ulcer of left foot (HCC)   . DM2 (diabetes mellitus, type 2) (HCC)   . Heart murmur   . HLD (hyperlipidemia)   . HTN (hypertension)   . Myocardial infarction (HCC) 1995 and 2005   Heart Attack  . Osteomyelitis of left foot (HCC)   .  Staphylococcus aureus bacteremia with sepsis [facility])     Past Surgical History:  Procedure Laterality Date  . ABDOMINAL AORTAGRAM N/A 04/07/2013   Procedure: ABDOMINAL [name];  Surgeon: [name], MD;  [facility];  Service: Cardiovascular;  Laterality: N/A;  . ABDOMINAL AORTAGRAM N/A 01/26/2014   Procedure: ABDOMINAL [name];  Surgeon: [name], MD;  [facility];  Service: Cardiovascular;  Laterality: N/A;  . ABDOMINAL AORTAGRAM N/A 06/13/2014   Procedure: ABDOMINAL [name];  Surgeon: [name], MD;  Location: [facility] CATH LAB;  Service: Cardiovascular;  Laterality: N/A;  . AMPUTATION Left 01/28/2014   Procedure: AMPUTATION DIGIT- LEFT 5TH TOE;  Surgeon: [name], MD;  Location: [facility] OR;  Service: Vascular;  Laterality: Left;  . AMPUTATION Left 02/02/2014   Procedure: Left 4th Ray Amputation vs Transmetatarsal Amputation;  Surgeon: [name], MD;  [facility] OR;  Service: Orthopedics;  Laterality: Left;  . AMPUTATION Left 03/24/2014   Procedure: AMPUTATION BELOW KNEE;  Surgeon: [name], MD;  Location: [facility] OR;  Service: Orthopedics;  Laterality: Left;  . APPENDECTOMY    . CARDIAC CATHETERIZATION    . CORONARY ANGIOPLASTY WITH STENT PLACEMENT    . FEMORAL-TIBIAL BYPASS GRAFT Left 01/28/2014   Procedure:  FEMORAL-ANTERIOR TIBIAL ARTERY Bypass Graft utilizing composite gortex graft and vein graft;  Surgeon: [name], MD;  Location: [facility] OR;  Service: Vascular;  Laterality: Left;  . LOWER EXTREMITY ANGIOGRAM Left 01/26/2014   Procedure: LOWER EXTREMITY ANGIOGRAM;  Surgeon: [name], MD;  [facility]ATH [facility];  Service: Cardiovascular;  Laterality: Left;  . TEE WITHOUT CARDIOVERSION N/A 03/28/2014   Procedure: TRANSESOPHAGEAL ECHOCARDIOGRAM (TEE);  Surgeon: Quintella Reichert, MD;  Location: [facility] ENDOSCOPY;  Service: Cardiovascular;  Laterality: N/A;    Social History   Socioeconomic History  . Marital  status: Married    Spouse name: Not on file  . Number of children: 1  . Years of education: Not on file  . Highest education level: Not on file  Occupational History  . Not on file  Social Needs  . Financial resource strain: Not on file  . Food insecurity:    Worry: Not on file    Inability: Not on file  . Transportation needs:    Medical: Not on file    Non-medical: Not on file  Tobacco Use  . Smoking status: Former Smoker    Types: Pipe    Last attempt to quit: 04/02/1979    Years since quitting: 38.6  . Smokeless tobacco: Never Used  Substance and Sexual  Activity  . Alcohol use: No    Alcohol/week: 0.0 standard drinks  . Drug use: No  . Sexual activity: Yes    Birth control/protection: None  Lifestyle  . Physical activity:    Days per week: Not on file    Minutes per session: Not on file  . Stress: Not on file  Relationships  . Social connections:    Talks on phone: Not on file    Gets together: Not on file    Attends religious service: Not on file    Active member of club or organization: Not on file    Attends meetings of clubs or organizations: Not on file    Relationship status: Not on file  . Intimate partner violence:    Fear of current or ex partner: Not on file    Emotionally abused: Not on file    Physically abused: Not on file    Forced sexual activity: Not on file  Other Topics Concern  . Not on file  Social History Narrative  . Not on file    Family History  Problem Relation Age of Onset  . Diabetes Mother   . Cancer Mother        Pancreatic  . Heart disease Mother        After age 77  . Hypertension Mother   . Hyperlipidemia Mother   . Diabetes Sister   . Cancer Sister   . Cancer Sister        Bone  . Cancer Father     ROS: no fevers or chills, productive cough, hemoptysis, dysphasia, odynophagia, melena, hematochezia, dysuria, hematuria, rash, seizure activity, orthopnea, PND, claudication. Remaining systems are negative.  Physical  Exam:   Blood pressure 138/78, pulse 64, height 5\' 9"  (1.753 m), weight 247 lb 3.2 oz (112.1 kg).  General:  Well developed/well nourished in NAD Skin warm/dry Patient not depressed No peripheral clubbing Back-normal HEENT-normal/normal eyelids Neck supple/normal carotid upstroke bilaterally; no bruits; no JVD; no thyromegaly chest - CTA/ normal expansion CV - RRR/normal S1 and S2; no rubs or gallops;  PMI nondisplaced, 2/6 systolic murmur left sternal border.  S2 is not diminished. Abdomen -NT/ND, no HSM, no mass, + bowel sounds, no bruit 2+ femoral pulses, no bruits Ext-1+ edema on the right, status post amputation on the left. Neuro-grossly nonfocal  ECG -sinus rhythm at a rate of 64.  Prior inferior infarct.  Lateral T wave inversion.  Personally reviewed  A/P  1 dyspnea-patient describes worsening dyspnea on exertion.  [facility].  LV function is normal with mild aortic stenosis.  Nuclear study suggest anterior and inferior ischemia.  Plan definitive evaluation with cardiac catheterization.  The risks and benefits including myocardial infarction, CVA and death discussed and he agrees to proceed.  We also discussed the possibility of contrast nephropathy.  He has baseline stage III renal insufficiency.  Hold Lasix the day prior to and the day of his procedure.  No ventriculogram.  Follow renal function after procedure.  I would like a right heart catheterization as well given mild volume excess on examination and dyspnea.  2 coronary artery disease-plan to continue aspirin and statin.  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  4 history of mild aortic stenosis-patient will need follow-up echoes in the future.  5 chronic stage III renal insufficiency-last creatinine 1.4.  As outlined above we will hold Lasix the day prior to and the day of the  procedure.  Gentle hydration prior to procedure.  No  ventriculogram.  Follow renal function afterwards.  6 hypertension-blood pressure is borderline.  Continue present medications and follow.  7 hyperlipidemia-continue statin.  8 peripheral vascular disease-continue aspirin and statin.  We will arrange follow-up with Dr. Kirke Corin.  Olga Millers, MD

## 2017-11-27 ENCOUNTER — Ambulatory Visit: Payer: Medicare Other | Admitting: Cardiology

## 2017-11-27 ENCOUNTER — Encounter: Payer: Self-pay | Admitting: Cardiology

## 2017-11-27 VITALS — BP 138/78 | HR 64 | Ht 69.0 in | Wt 247.2 lb

## 2017-11-27 DIAGNOSIS — I2510 Atherosclerotic heart disease of native coronary artery without angina pectoris: Secondary | ICD-10-CM

## 2017-11-27 DIAGNOSIS — R0602 Shortness of breath: Secondary | ICD-10-CM | POA: Diagnosis not present

## 2017-11-27 DIAGNOSIS — I679 Cerebrovascular disease, unspecified: Secondary | ICD-10-CM

## 2017-11-27 DIAGNOSIS — I10 Essential (primary) hypertension: Secondary | ICD-10-CM | POA: Diagnosis not present

## 2017-11-27 DIAGNOSIS — E7800 Pure hypercholesterolemia, unspecified: Secondary | ICD-10-CM

## 2017-11-27 DIAGNOSIS — I739 Peripheral vascular disease, unspecified: Secondary | ICD-10-CM

## 2017-11-27 LAB — BASIC METABOLIC PANEL
BUN/Creatinine Ratio: 17 (ref 10–24)
BUN: 20 mg/dL (ref 8–27)
CO2: 22 mmol/L (ref 20–29)
Calcium: 9.5 mg/dL (ref 8.6–10.2)
Chloride: 100 mmol/L (ref 96–106)
Creatinine, Ser: 1.15 mg/dL (ref 0.76–1.27)
GFR calc Af Amer: 71 mL/min/{1.73_m2} (ref >59)
GFR calc non Af Amer: 61 mL/min/{1.73_m2} (ref >59)
Glucose: 234 mg/dL — ABNORMAL HIGH (ref 65–99)
Potassium: 5 mmol/L (ref 3.5–5.2)
Sodium: 140 mmol/L (ref 134–144)

## 2017-11-27 LAB — CBC
Hematocrit: 36.7 % — ABNORMAL LOW (ref 37.5–51.0)
Hemoglobin: 12.4 g/dL — ABNORMAL LOW (ref 13.0–17.7)
MCH: 28.4 pg (ref 26.6–33.0)
MCHC: 33.8 g/dL (ref 31.5–35.7)
MCV: 84 fL (ref 79–97)
Platelets: 228 10*3/uL (ref 150–450)
RBC: 4.36 x10E6/uL (ref 4.14–5.80)
RDW: 16 % — ABNORMAL HIGH (ref 12.3–15.4)
WBC: 7.2 10*3/uL (ref 3.4–10.8)

## 2017-11-27 NOTE — Patient Instructions (Signed)
    [facility] [address] [place] [address] Dept: [phone] Loc: [phone]  Jordan Cheekshomas [name]  11/27/2017  You are scheduled for a Cardiac Catheterization on Tuesday, September 3 with Dr. Peter [name].  1. Please arrive at the [facility]: [address] [place], [place] [address] at 7:00 AM (This time is two hours before your procedure to ensure your preparation). Free valet parking service is available.   Special note: Every effort is made to have your procedure done on time. Please understand that emergencies sometimes delay scheduled procedures.  2. Diet: Do not eat solid foods after midnight.  The patient may have clear liquids until 5am upon the day of the procedure.  3. Labs: You will need to have blood drawn TODAY  4. Medication instructions in preparation for your procedure:  TAKE 1/2 THE EVENING DOSE OF INSULIN Monday NIGHT AND TAKE NONE Tuesday MORNING  DO NOT TAKE ANY VICTOZA Tuesday MORNING  DO NOT TAKE FUROSEMIDE Monday OR TUESDAY  On the morning of your procedure, take your Aspirin and any morning medicines NOT listed above.  You may use sips of water.  5. Plan for one night stay--bring personal belongings. 6. Bring a current list of your medications and current insurance cards. 7. You MUST have a responsible person to drive you home. 8. Someone MUST be with you the first 24 hours after you arrive home or your discharge will be delayed. 9. Please wear clothes that are easy to get on and off and wear slip-on shoes.  Thank you for allowing us to care for you!   -- [facility]   Your physician recommends that you return for lab work in: 2 DAYS AFTER THE PROCEDURE

## 2017-11-28 ENCOUNTER — Encounter: Payer: Self-pay | Admitting: *Deleted

## 2017-12-02 ENCOUNTER — Ambulatory Visit (HOSPITAL_COMMUNITY)
Admission: RE | Admit: 2017-12-02 | Discharge: 2017-12-02 | Disposition: A | Discharge status: Home / Self Care | Payer: Medicare Other | Source: Ambulatory Visit | Attending: Cardiology | Admitting: Cardiology

## 2017-12-02 ENCOUNTER — Encounter (HOSPITAL_COMMUNITY): Payer: Self-pay | Admitting: Cardiology

## 2017-12-02 ENCOUNTER — Encounter (HOSPITAL_COMMUNITY)
Admission: RE | Disposition: A | Discharge status: Home / Self Care | Payer: Self-pay | Source: Ambulatory Visit | Attending: Cardiology

## 2017-12-02 ENCOUNTER — Other Ambulatory Visit: Payer: Self-pay

## 2017-12-02 DIAGNOSIS — Z7982 Long term (current) use of aspirin: Secondary | ICD-10-CM | POA: Diagnosis not present

## 2017-12-02 DIAGNOSIS — I272 Pulmonary hypertension, unspecified: Secondary | ICD-10-CM | POA: Insufficient documentation

## 2017-12-02 DIAGNOSIS — I350 Nonrheumatic aortic (valve) stenosis: Secondary | ICD-10-CM | POA: Insufficient documentation

## 2017-12-02 DIAGNOSIS — I110 Hypertensive heart disease with heart failure: Secondary | ICD-10-CM | POA: Insufficient documentation

## 2017-12-02 DIAGNOSIS — I6523 Occlusion and stenosis of bilateral carotid arteries: Secondary | ICD-10-CM | POA: Diagnosis not present

## 2017-12-02 DIAGNOSIS — I252 Old myocardial infarction: Secondary | ICD-10-CM | POA: Insufficient documentation

## 2017-12-02 DIAGNOSIS — Z794 Long term (current) use of insulin: Secondary | ICD-10-CM | POA: Insufficient documentation

## 2017-12-02 DIAGNOSIS — Z89512 Acquired absence of left leg below knee: Secondary | ICD-10-CM | POA: Diagnosis not present

## 2017-12-02 DIAGNOSIS — Z955 Presence of coronary angioplasty implant and graft: Secondary | ICD-10-CM | POA: Diagnosis not present

## 2017-12-02 DIAGNOSIS — E1165 Type 2 diabetes mellitus with hyperglycemia: Secondary | ICD-10-CM | POA: Diagnosis present

## 2017-12-02 DIAGNOSIS — IMO0002 Reserved for concepts with insufficient information to code with codable children: Secondary | ICD-10-CM | POA: Diagnosis present

## 2017-12-02 DIAGNOSIS — I10 Essential (primary) hypertension: Secondary | ICD-10-CM | POA: Diagnosis present

## 2017-12-02 DIAGNOSIS — I739 Peripheral vascular disease, unspecified: Secondary | ICD-10-CM | POA: Diagnosis present

## 2017-12-02 DIAGNOSIS — I509 Heart failure, unspecified: Secondary | ICD-10-CM | POA: Insufficient documentation

## 2017-12-02 DIAGNOSIS — R0609 Other forms of dyspnea: Secondary | ICD-10-CM | POA: Diagnosis not present

## 2017-12-02 DIAGNOSIS — I2582 Chronic total occlusion of coronary artery: Secondary | ICD-10-CM | POA: Diagnosis not present

## 2017-12-02 DIAGNOSIS — E785 Hyperlipidemia, unspecified: Secondary | ICD-10-CM | POA: Diagnosis not present

## 2017-12-02 DIAGNOSIS — E1151 Type 2 diabetes mellitus with diabetic peripheral angiopathy without gangrene: Secondary | ICD-10-CM | POA: Insufficient documentation

## 2017-12-02 DIAGNOSIS — I5032 Chronic diastolic (congestive) heart failure: Secondary | ICD-10-CM | POA: Diagnosis present

## 2017-12-02 DIAGNOSIS — Z87891 Personal history of nicotine dependence: Secondary | ICD-10-CM | POA: Insufficient documentation

## 2017-12-02 DIAGNOSIS — R0602 Shortness of breath: Secondary | ICD-10-CM | POA: Diagnosis present

## 2017-12-02 DIAGNOSIS — I2510 Atherosclerotic heart disease of native coronary artery without angina pectoris: Secondary | ICD-10-CM

## 2017-12-02 HISTORY — PX: RIGHT/LEFT HEART CATH AND CORONARY ANGIOGRAPHY: CATH118266

## 2017-12-02 LAB — POCT I-STAT 3, ART BLOOD GAS (G3+)
Acid-base deficit: 4 mmol/L — ABNORMAL HIGH (ref 0.0–2.0)
Bicarbonate: 23.4 mmol/L (ref 20.0–28.0)
O2 Saturation: 98 %
TCO2: 25 mmol/L (ref 22–32)
pCO2 arterial: 50.2 mmHg — ABNORMAL HIGH (ref 32.0–48.0)
pH, Arterial: 7.275 — ABNORMAL LOW (ref 7.350–7.450)
pO2, Arterial: 118 mmHg — ABNORMAL HIGH (ref 83.0–108.0)

## 2017-12-02 LAB — GLUCOSE, CAPILLARY: Glucose-Capillary: 156 mg/dL — ABNORMAL HIGH (ref 70–99)

## 2017-12-02 LAB — POCT I-STAT 3, VENOUS BLOOD GAS (G3P V)
Acid-base deficit: 3 mmol/L — ABNORMAL HIGH (ref 0.0–2.0)
Bicarbonate: 24.5 mmol/L (ref 20.0–28.0)
O2 Saturation: 65 %
TCO2: 26 mmol/L (ref 22–32)
pCO2, Ven: 54 mmHg (ref 44.0–60.0)
pH, Ven: 7.265 (ref 7.250–7.430)
pO2, Ven: 39 mmHg (ref 32.0–45.0)

## 2017-12-02 SURGERY — RIGHT/LEFT HEART CATH AND CORONARY ANGIOGRAPHY
Anesthesia: LOCAL

## 2017-12-02 MED ORDER — SODIUM CHLORIDE 0.9% FLUSH: 3.0000 mL | INTRAVENOUS | Status: DC | PRN

## 2017-12-02 MED ORDER — FENTANYL CITRATE (PF) 100 MCG/2ML IJ SOLN
INTRAMUSCULAR | Status: DC | PRN
  Administered 2017-12-02: 25 ug via INTRAVENOUS

## 2017-12-02 MED ORDER — HEPARIN (PORCINE) IN NACL 1000-0.9 UT/500ML-% IV SOLN
INTRAVENOUS | Status: DC | PRN
  Administered 2017-12-02: 500 mL

## 2017-12-02 MED ORDER — LIDOCAINE HCL (PF) 1 % IJ SOLN
INTRAMUSCULAR | Status: AC
  Filled 2017-12-02: qty 30

## 2017-12-02 MED ORDER — IOHEXOL 350 MG/ML SOLN
INTRAVENOUS | Status: DC | PRN
  Administered 2017-12-02: 65 mL via INTRAVENOUS

## 2017-12-02 MED ORDER — METFORMIN HCL ER 500 MG PO TB24: 1000.0000 mg | ORAL_TABLET | Freq: Two times a day (BID) | ORAL | Status: DC

## 2017-12-02 MED ORDER — SODIUM CHLORIDE 0.9% FLUSH: 3.0000 mL | Freq: Two times a day (BID) | INTRAVENOUS | Status: DC

## 2017-12-02 MED ORDER — HEPARIN SODIUM (PORCINE) 1000 UNIT/ML IJ SOLN
INTRAMUSCULAR | Status: AC
  Filled 2017-12-02: qty 1

## 2017-12-02 MED ORDER — FENTANYL CITRATE (PF) 100 MCG/2ML IJ SOLN
INTRAMUSCULAR | Status: AC
  Filled 2017-12-02: qty 2

## 2017-12-02 MED ORDER — VERAPAMIL HCL 2.5 MG/ML IV SOLN
INTRAVENOUS | Status: AC
  Filled 2017-12-02: qty 2

## 2017-12-02 MED ORDER — SODIUM CHLORIDE 0.9 % WEIGHT BASED INFUSION: 1.0000 mL/kg/h | INTRAVENOUS | Status: DC

## 2017-12-02 MED ORDER — ONDANSETRON HCL 4 MG/2ML IJ SOLN: 4.0000 mg | Freq: Four times a day (QID) | INTRAMUSCULAR | Status: DC | PRN

## 2017-12-02 MED ORDER — SODIUM CHLORIDE 0.9 % WEIGHT BASED INFUSION: 1.0000 mL/kg/h | INTRAVENOUS | Status: AC

## 2017-12-02 MED ORDER — HEPARIN SODIUM (PORCINE) 1000 UNIT/ML IJ SOLN
INTRAMUSCULAR | Status: DC | PRN
  Administered 2017-12-02: 5500 [IU] via INTRAVENOUS

## 2017-12-02 MED ORDER — HEPARIN (PORCINE) IN NACL 1000-0.9 UT/500ML-% IV SOLN
INTRAVENOUS | Status: AC
  Filled 2017-12-02: qty 1000

## 2017-12-02 MED ORDER — VERAPAMIL HCL 2.5 MG/ML IV SOLN
INTRAVENOUS | Status: DC | PRN
  Administered 2017-12-02: 10 mL via INTRA_ARTERIAL

## 2017-12-02 MED ORDER — SODIUM CHLORIDE 0.9 % IV SOLN: 250.0000 mL | INTRAVENOUS | Status: DC | PRN

## 2017-12-02 MED ORDER — MIDAZOLAM HCL 2 MG/2ML IJ SOLN
INTRAMUSCULAR | Status: DC | PRN
  Administered 2017-12-02: 1 mg via INTRAVENOUS

## 2017-12-02 MED ORDER — SODIUM CHLORIDE 0.9 % WEIGHT BASED INFUSION
3.0000 mL/kg/h | INTRAVENOUS | Status: AC
  Administered 2017-12-02: 3 mL/kg/h via INTRAVENOUS

## 2017-12-02 MED ORDER — ASPIRIN 81 MG PO CHEW: 81.0000 mg | CHEWABLE_TABLET | ORAL | Status: DC

## 2017-12-02 MED ORDER — LIDOCAINE HCL (PF) 1 % IJ SOLN
INTRAMUSCULAR | Status: DC | PRN
  Administered 2017-12-02: 1 mL
  Administered 2017-12-02: 2 mL

## 2017-12-02 MED ORDER — ACETAMINOPHEN 325 MG PO TABS: 650.0000 mg | ORAL_TABLET | ORAL | Status: DC | PRN

## 2017-12-02 MED ORDER — MIDAZOLAM HCL 2 MG/2ML IJ SOLN
INTRAMUSCULAR | Status: AC
  Filled 2017-12-02: qty 2

## 2017-12-02 SURGICAL SUPPLY — 12 items
CATH BALLN WEDGE 5F 110CM (CATHETERS) ×1 IMPLANT
CATH IMPULSE 5F ANG/FL3.5 (CATHETERS) ×2 IMPLANT
DEVICE RAD COMP TR BAND LRG (VASCULAR PRODUCTS) ×1 IMPLANT
GLIDESHEATH SLEND SS 6F .021 (SHEATH) ×2 IMPLANT
GUIDEWIRE INQWIRE 1.5J.035X260 (WIRE) ×1 IMPLANT
HOVERMATT SINGLE USE (MISCELLANEOUS) ×1 IMPLANT
INQWIRE 1.5J .035X260CM (WIRE) ×2
KIT HEART LEFT (KITS) ×2 IMPLANT
PACK CARDIAC CATHETERIZATION (CUSTOM PROCEDURE TRAY) ×2 IMPLANT
SHEATH GLIDE SLENDER 4/5FR (SHEATH) ×2 IMPLANT
TRANSDUCER W/STOPCOCK (MISCELLANEOUS) ×2 IMPLANT
TUBING CIL FLEX 10 FLL-RA (TUBING) ×2 IMPLANT

## 2017-12-02 NOTE — Discharge Instructions (Signed)

## 2017-12-02 NOTE — Interval H&P Note (Signed)
History and Physical Interval Note:  12/02/2017 8:22 AM  Jordan [name]  has presented today for surgery, with the diagnosis of SOB  The various methods of treatment have been discussed with the patient and family. After consideration of risks, benefits and other options for treatment, the patient has consented to  Procedure(s): RIGHT/LEFT HEART CATH AND CORONARY ANGIOGRAPHY (N/A) as a surgical intervention .  The patient's history has been reviewed, patient examined, no change in status, stable for surgery.  I have reviewed the patient's chart and labs.  Questions were answered to the patient's satisfaction.     Theron Arista [facility] 12/02/2017 8:23 AM Cath Lab Visit (complete for each Cath Lab visit)  Clinical Evaluation Leading to the Procedure:   ACS: No.  Non-ACS:    Anginal Classification: CCS III  Anti-ischemic medical therapy: Maximal Therapy (2 or more classes of medications)  Non-Invasive Test Results: Intermediate-risk stress test findings: cardiac mortality 1-3%/year  Prior CABG: No previous CABG

## 2017-12-02 NOTE — Progress Notes (Signed)
Right brachial venous sheath pulled.  Pressure held for 10 minutes.  Site covered with gauze and coban. Level zero

## 2017-12-04 LAB — BASIC METABOLIC PANEL
BUN/Creatinine Ratio: 14 (ref 10–24)
BUN: 17 mg/dL (ref 8–27)
CO2: 30 mmol/L — ABNORMAL HIGH (ref 20–29)
Calcium: 9.8 mg/dL (ref 8.6–10.2)
Chloride: 98 mmol/L (ref 96–106)
Creatinine, Ser: 1.23 mg/dL (ref 0.76–1.27)
GFR calc Af Amer: 66 mL/min/{1.73_m2} (ref >59)
GFR calc non Af Amer: 57 mL/min/{1.73_m2} — ABNORMAL LOW (ref >59)
Glucose: 263 mg/dL — ABNORMAL HIGH (ref 65–99)
Potassium: 4.8 mmol/L (ref 3.5–5.2)
Sodium: 136 mmol/L (ref 134–144)

## 2017-12-11 ENCOUNTER — Ambulatory Visit (INDEPENDENT_AMBULATORY_CARE_PROVIDER_SITE_OTHER): Payer: Medicare Other | Admitting: Adult Health

## 2017-12-11 ENCOUNTER — Encounter: Payer: Self-pay | Admitting: Adult Health

## 2017-12-11 VITALS — BP 148/65 | HR 71 | Ht 69.0 in | Wt 239.8 lb

## 2017-12-11 DIAGNOSIS — I358 Other nonrheumatic aortic valve disorders: Secondary | ICD-10-CM

## 2017-12-11 DIAGNOSIS — E7800 Pure hypercholesterolemia, unspecified: Secondary | ICD-10-CM | POA: Diagnosis not present

## 2017-12-11 DIAGNOSIS — I739 Peripheral vascular disease, unspecified: Secondary | ICD-10-CM | POA: Diagnosis not present

## 2017-12-11 DIAGNOSIS — I10 Essential (primary) hypertension: Secondary | ICD-10-CM | POA: Diagnosis not present

## 2017-12-11 DIAGNOSIS — I2510 Atherosclerotic heart disease of native coronary artery without angina pectoris: Secondary | ICD-10-CM

## 2017-12-11 NOTE — Progress Notes (Signed)
Cardiology Office Note   Date:  12/11/2017   ID:  Jordan [name], DOB 12/13/1940, MRN [id number]  PCP:  [name], MD  Cardiologist:  [facility]   Chief Complaint  Patient presents with  . Coronary Artery Disease  . Hypertension  . Aortic Stenosis    mild     History of Present Illness: Jordan [name] is a 77 y.o. male who presents for ongoing assessment and management of CAD, with a stress test completed at [facility] revealing ischemia in the anterior and apical wall. EF of 51%.  He was also found to have abnormal ABI;s in the right lower extremity. He was scheduled for RHC and LHC in the setting of worsening dyspnea  Other history of HTN hypercholesterolemia, and IDDM.   Cardiac cath completed on 12/02/2017 Conclusion     Ost LM lesion is 30% stenosed.  Previously placed Prox LAD to Mid LAD stent (unknown type) is widely patent.  Prox LAD lesion is 40% stenosed.  2nd Diag lesion is 60% stenosed.  Previously placed Prox Cx to Mid Cx stent (unknown type) is widely patent.  Prox Cx lesion is 30% stenosed.  Mid RCA lesion is 99% stenosed.  Dist RCA lesion is 100% stenosed.  LV end diastolic pressure is normal.  Hemodynamic findings consistent with mild pulmonary hypertension.  There is mild aortic valve stenosis.   1. Single vessel occlusive CAD. CTO of the RCA. Stents in the LAD and LCx are patent 2. Normal LV filling pressures 3. Mild pulmonary HTN 4. Mild aortic stenosis. Mean gradient 13 mm Hg. Valve area 1.78 cm squared.  5. Normal cardiac output  Plan: Angiographically there is no significant change from 2010. Continue medical therapy.    He comes today with multiple questions about cath results, his BP medications and his labs. He denies chest pain. Continues to have mild NYHA Class II symptoms. He has been placed on Victoza by his PCP.    Past Medical History:  Diagnosis Date  . Anemia   . Aortic stenosis   . CAD (coronary artery disease)     a. s/p IMI in past tx with POBA and cath 6 mos later with occluded RCA;  b. h/o Taxus DES to LAD and CFX;  c.  cath 1/10: LM ok, mLAD 40-50%, LAD stent ok, D2 tandem 60-70%, pCFX 30%, mAVCFX stent ok, pRCA 40%, RV 90%, RV marginal 90%, dRCA filled L-R collats tx medically  . Carotid stenosis    dopplers 5/11: 40-59% bilat  . CHF (congestive heart failure) (HCC)   . Diabetic ulcer of left foot (HCC)   . DM2 (diabetes mellitus, type 2) (HCC)   . Heart murmur   . HLD (hyperlipidemia)   . HTN (hypertension)   . Myocardial infarction (HCC) 1995 and 2005   Heart Attack  . Osteomyelitis of left foot (HCC)   . Staphylococcus aureus bacteremia with sepsis [facility])     Past Surgical History:  Procedure Laterality Date  . ABDOMINAL AORTAGRAM N/A 04/07/2013   Procedure: ABDOMINAL [name];  Surgeon: [name], MD;  [facility] CATH [facility];  Service: Cardiovascular;  Laterality: N/A;  . ABDOMINAL AORTAGRAM N/A 01/26/2014   Procedure: ABDOMINAL [name];  Surgeon: [name], MD;  [facility]: [facility] CATH LAB;  Service: Cardiovascular;  Laterality: N/A;  . ABDOMINAL AORTAGRAM N/A 06/13/2014   Procedure: ABDOMINAL [name];  Surgeon: [name], MD;  Location: [facility] CATH LAB;  Service: Cardiovascular;  Laterality: N/A;  . AMPUTATION Left 01/28/2014  Procedure: AMPUTATION DIGIT- LEFT 5TH TOE;  Surgeon: Chuck Hinthristopher S Dickson, MD;  [facility];  Service: Vascular;  Laterality: Left;  . AMPUTATION Left 02/02/2014   Procedure: Left 4th Ray Amputation vs Transmetatarsal Amputation;  Surgeon: Nadara MustardMarcus Duda V, MD;  [facility]: [facility] OR;  Service: Orthopedics;  Laterality: Left;  . AMPUTATION Left 03/24/2014   Procedure: AMPUTATION BELOW KNEE;  Surgeon: Cammy CopaGregory Scott Dean, MD;  [facility];  Service: Orthopedics;  Laterality: Left;  . APPENDECTOMY    . CARDIAC CATHETERIZATION    . CORONARY ANGIOPLASTY WITH STENT PLACEMENT    . FEMORAL-TIBIAL BYPASS GRAFT Left 01/28/2014   Procedure:   FEMORAL-ANTERIOR TIBIAL ARTERY Bypass Graft utilizing composite gortex graft and vein graft;  Surgeon: Chuck Hinthristopher S Dickson, MD;  [facility];  Service: Vascular;  Laterality: Left;  . LOWER EXTREMITY ANGIOGRAM Left 01/26/2014   Procedure: LOWER EXTREMITY ANGIOGRAM;  Surgeon: Iran OuchMuhammad A Arida, MD;  [facility] CATH [facility];  Service: Cardiovascular;  Laterality: Left;  . RIGHT/LEFT HEART CATH AND CORONARY ANGIOGRAPHY N/A 12/02/2017   Procedure: RIGHT/LEFT HEART CATH AND CORONARY ANGIOGRAPHY;  Surgeon: [name], [name], MD;  [facility];  Service: Cardiovascular;  Laterality: N/A;  . TEE WITHOUT CARDIOVERSION N/A 03/28/2014   Procedure: TRANSESOPHAGEAL ECHOCARDIOGRAM (TEE);  Surgeon: [name], MD;  [facility];  Service: Cardiovascular;  Laterality: N/A;     Current Outpatient Medications  Medication Sig Dispense Refill  . acetaminophen (TYLENOL) 325 MG tablet Take 2 tablets (650 mg total) by mouth every 6 (six) hours as needed for mild pain (or Fever >/= 101).    [name]. amLODipine (NORVASC) 10 MG tablet Take 10 mg by mouth daily.    [name]. aspirin 81 MG tablet Take 81 mg by mouth daily.    . furosemide (LASIX) 20 MG tablet Take 40 mg by mouth daily.     [name]. gabapentin (NEURONTIN) 300 MG capsule Take 300 mg by mouth 2 (two) times daily.     . insulin aspart (NOVOLOG) 100 UNIT/ML injection Inject 20-40 Units into the skin See admin instructions. Inject 40 units subcutaneously in the morning and 36 units at bedtime. May take additional 20 units at lunch time if blood sugar level is above 150    . insulin glargine (LANTUS) 100 UNIT/ML injection Inject 37-40 Units into the skin 2 (two) times daily. Inject 40 units subcutaneously in the morning and 37 units at night    . isosorbide mononitrate (IMDUR) 30 MG 24 hr tablet Take 30 mg by mouth daily.    [name]. liraglutide (VICTOZA) 18 MG/3ML SOPN Inject 0.6 mg into the skin daily.     [name]. losartan (COZAAR) 100 MG tablet Take 100 mg by mouth  daily.    . metFORMIN (GLUCOPHAGE-XR) 500 MG 24 hr tablet Take 2 tablets (1,000 mg total) by mouth 2 (two) times daily.    . metoprolol (TOPROL-XL) 100 MG 24 hr tablet Take 100 mg by mouth 2 (two) times daily.      . nitroGLYCERIN (NITROSTAT) 0.4 MG SL tablet Place 0.4 mg under the tongue every 5 (five) minutes as needed for chest pain.    . rosuvastatin (CRESTOR) 40 MG tablet Take 40 mg by mouth at bedtime.     . Triamcinolone Acetonide (TRIAMCINOLONE 0.1 % CREAM : EUCERIN) CREA Apply 1 application topically daily.    . Vitamin D, Ergocalciferol, (DRISDOL) 50000 units CAPS capsule Take 50,000 Units by mouth every 30 (thirty) days.     .[name]  escitalopram (LEXAPRO) 10 MG tablet Take 10 mg by mouth at bedtime.     . metFORMIN (GLUCOPHAGE-XR) 500 MG 24 hr tablet Take 1,000 mg by mouth 2 (two) times daily.     No current facility-administered medications for this visit.     Allergies:   Cilostazol    Social History:  The patient  reports that he quit smoking about 38 years ago. His smoking use included pipe. He has never used smokeless tobacco. He reports that he does not drink alcohol or use drugs.   Family History:  The patient's family history includes Cancer in his father, mother, sister, and sister; Diabetes in his mother and sister; Heart disease in his mother; Hyperlipidemia in his mother; Hypertension in his mother.    ROS: All other systems are reviewed and negative. Unless otherwise mentioned in H&P    PHYSICAL EXAM: VS:  BP (!) 148/65   Pulse 71   Ht 5\' 9"  (1.753 m)   Wt 239 lb 12.8 oz (108.8 kg)   BMI 35.41 kg/m  , BMI Body mass index is 35.41 kg/m. GEN: Well nourished, well developed, in no acute distress  HEENT: normal  Neck: no JVD, softcarotid bruits on the right, no masses Cardiac: RRR; 2/6 systolic murmurs, rubs, or gallops,no edema  Respiratory:  clear to auscultation bilaterally, normal work of breathing GI: soft, nontender, nondistended, + BS MS: no deformity or  atrophy Left AKA.  Skin: warm and dry, no rash.Cath insertion site is well healed.  Neuro:  Strength and sensation are intact Psych: euthymic mood, full affect   EKG:  Not competed.   Recent Labs: 11/27/2017: Hemoglobin 12.4; Platelets 228 12/04/2017: BUN 17; Creatinine, Ser 1.23; Potassium 4.8; Sodium 136    Lipid Panel    Component Value Date/Time   CHOL  04/17/2008 0828    171        ATP III CLASSIFICATION:  <200     mg/dL   Desirable  098-119  mg/dL   Borderline High  >=147    mg/dL   High          TRIG 829 (H) 04/17/2008 0828   HDL 24 (L) 04/17/2008 0828   CHOLHDL 7.1 04/17/2008 0828   VLDL 36 04/17/2008 0828   LDLCALC (H) 04/17/2008 0828    111        Total Cholesterol/HDL:CHD Risk Coronary Heart Disease Risk Table                     Men   Women  1/2 Average Risk   3.4   3.3  Average Risk       5.0   4.4  2 X Average Risk   9.6   7.1  3 X Average Risk  23.4   11.0        Use the calculated Patient Ratio above and the CHD Risk Table to determine the patient's CHD Risk.        ATP III CLASSIFICATION (LDL):  <100     mg/dL   Optimal  562-130  mg/dL   Near or Above                    Optimal  130-159  mg/dL   Borderline  865-784  mg/dL   High  >696     mg/dL   Very High      Wt Readings from Last 3 Encounters:  12/11/17 239 lb 12.8 oz (108.8 kg)  12/02/17  245 lb (111.1 kg)  11/27/17 247 lb 3.2 oz (112.1 kg)      Other studies Reviewed: Echocardiogram 04/17/2014.  Left ventricle: Systolic function was normal. The estimated ejection fraction was in the range of 50% to 55%. Wall motion was normal; there were no regional wall motion abnormalities. - Aortic valve: Moderate thickening and calcification. Valve mobility was restricted. No evidence of vegetation. There was mild stenosis. - Mitral valve: Mild focal calcification of the anterior leaflet (medial segment(s)), with mild involvement of chords. No evidence of vegetation. - Left atrium:  No evidence of thrombus in the appendage. - Right atrium: No evidence of thrombus in the atrial cavity or appendage. - Atrial septum: No defect or patent foramen ovale was identified. Echo contrast study showed no right-to-left atrial level shunt, following an increase in RA pressure induced by provocative maneuvers. - Tricuspid valve: No evidence of vegetation. - Pulmonic valve: No evidence of vegetation. - Recommendations: There is no obvious vegetation noted although the AV is thickened and calcified with mild AS so recommend clinical correlation with blood cultures.  ASSESSMENT AND PLAN:  1.  CAD No significant change in his CAD burden per cath. He is to continue secondary prevention. His pulmonary pressures revealed mild pulmonary hypertension. Continue current dose of lasix and amlodipine. He remains on BB and ASA.   2. Hypertension: His BP is elevated today. I have asked him to keep up with his BP at home. Goal of BP 120/60 in diabetic patient. May need medication adjustment if this remains elevated.   3, Carotid artery Disease: He will need annual follow up for ongoing surveillance, as he was found to have bilateral disease of 50-69%. .   4. Aortic Valve Stenosis: Mild per echo and cath. Will need follow up echo's annually for surveillance.     Current medicines are reviewed at length with the patient today Multiple questions are answered 35 minutes spent with the patient today.   Labs/ tests ordered today include: None   Bettey Mare. Liborio Nixon, ANP, AACC   12/11/2017 12:52 PM    [facility] [address] [place], [place] [address] Phone: [phone]; Fax: [phone]

## 2017-12-11 NOTE — Patient Instructions (Signed)
Medication Instructions:  NO CHANGES- Your physician recommends that you continue on your current medications as directed. Please refer to the Current Medication list given to you today. 6If you need a refill on your cardiac medications before your next appointment, please call your pharmacy.  Follow-Up: Your physician wants you to follow-up in: 6 MONTHS WITH DR Jens SomRENSHAW You should receive a reminder letter in the mail two months in advance. If you do not receive a letter, please call our office in JAN 2020 to schedule your MAR 2020 follow-up appointment.   Thank you for choosing [facility]!!

## 2018-01-28 ENCOUNTER — Telehealth: Payer: Self-pay | Admitting: Cardiology

## 2018-01-28 DIAGNOSIS — Z5181 Encounter for therapeutic drug level monitoring: Secondary | ICD-10-CM

## 2018-01-28 DIAGNOSIS — R252 Cramp and spasm: Secondary | ICD-10-CM

## 2018-01-28 NOTE — Telephone Encounter (Addendum)
Received a call from [facility] case manager regarding patients weight. Weight down 5.2 pounds and having leg cramps. The weight is over about a months time but he is working on weight loss. Nurse was concerned about the cramping in legs. He is currently taking Lasix 40 mg daily. Discussed with DNP Samara Deist L and she recommends BMET/MAGNESIUM level,  follow up with Dr Edilia Bo. Advised patient, verbalized understanding. Faxed lab orders to PCP

## 2018-01-28 NOTE — Telephone Encounter (Signed)
New Message °

## 2018-08-13 ENCOUNTER — Ambulatory Visit: Payer: Medicare Other | Admitting: Cardiology

## 2018-09-17 ENCOUNTER — Telehealth: Payer: Self-pay | Admitting: *Deleted

## 2018-09-17 ENCOUNTER — Telehealth: Payer: Self-pay | Admitting: Cardiology

## 2018-09-17 NOTE — Telephone Encounter (Signed)
Left message for patient to call and schedule 6 mos virtual visit with Dr. Stanford Breed (he has openings on 09/18/18)

## 2018-09-17 NOTE — Progress Notes (Signed)
Virtual Visit via Video Note   This visit type was conducted due to national recommendations for restrictions regarding the COVID-19 Pandemic (e.g. social distancing) in an effort to limit this patient's exposure and mitigate transmission in our community.  Due to his co-morbid illnesses, this patient is at least at moderate risk for complications without adequate follow up.  This format is felt to be most appropriate for this patient at this time.  All issues noted in this document were discussed and addressed.  A limited physical exam was performed with this format.  Please refer to the patient's chart for his consent to telehealth for [facility].   Date:  09/18/2018   ID:  Jordan [name], DOB Feb 18, 1941, MRN [id number]  Patient Location: Home Provider Location: Home  PCP:  Raelene Bott, MD  Cardiologist:  Dr Stanford Breed  Evaluation Performed:  Follow-Up Visit  Chief Complaint:  FU CAD  History of Present Illness:    FU CAD. Patient apparently evaluated 4/19 for dyspnea. A nuclear study was performed at [facility] and by report there was ischemia versus attenuation artifact in the inferior wall as well as ischemia in the anterior and apical wall.  Ejection fraction 51%.  Echocardiogram showed ejection fraction >55% and mild aortic stenosis (mean gradient 13 mmHg).  Carotid Dopplers showed 50 to 69% bilateral stenosis.  He also had abnormal ABIs in the right lower extremity.  Cardiac catheterization September 2019 showed occluded right coronary artery, patent stent in proximal LAD, 60% diagonal, patent stent in the circumflex and otherwise nonobstructive disease.  Left ventricular end-diastolic pressure normal.  Mild aortic stenosis with mean gradient 13 mmHg.  Since last seen, dyspnea, chest pain, palpitations or syncope.  The patient does not have symptoms concerning for COVID-19 infection (fever, chills, cough, or new shortness of breath).    Past Medical History:  Diagnosis  Date  . Anemia   . Aortic stenosis   . CAD (coronary artery disease)    a. s/p IMI in past tx with POBA and cath 6 mos later with occluded RCA;  b. h/o Taxus DES to LAD and CFX;  c.  cath 1/10: LM ok, mLAD 40-50%, LAD stent ok, D2 tandem 60-70%, pCFX 30%, mAVCFX stent ok, pRCA 40%, RV 90%, RV marginal 90%, dRCA filled L-R collats tx medically  . Carotid stenosis    dopplers 5/11: 40-59% bilat  . CHF (congestive heart failure) ([place])   . Diabetic ulcer of left foot ([place])   . DM2 (diabetes mellitus, type 2) ([place])   . Heart murmur   . HLD (hyperlipidemia)   . HTN (hypertension)   . Myocardial infarction ([place]) 1995 and 2005   Heart Attack  . Osteomyelitis of left foot ([place])   . Staphylococcus aureus bacteremia with sepsis [facility])    Past Surgical History:  Procedure Laterality Date  . ABDOMINAL AORTAGRAM N/A 04/07/2013   Procedure: ABDOMINAL Maxcine Ham;  Surgeon: Wellington Hampshire, MD;  Location: [place] CATH LAB;  Service: Cardiovascular;  Laterality: N/A;  . ABDOMINAL AORTAGRAM N/A 01/26/2014   Procedure: ABDOMINAL Maxcine Ham;  Surgeon: Wellington Hampshire, MD;  Location: [place] CATH LAB;  Service: Cardiovascular;  Laterality: N/A;  . ABDOMINAL AORTAGRAM N/A 06/13/2014   Procedure: ABDOMINAL Maxcine Ham;  Surgeon: Angelia Mould, MD;  Location: [facility] CATH LAB;  Service: Cardiovascular;  Laterality: N/A;  . AMPUTATION Left 01/28/2014   Procedure: AMPUTATION DIGIT- LEFT 5TH TOE;  Surgeon: Angelia Mould, MD;  Location: [place];  Service: Vascular;  Laterality: Left;  .  AMPUTATION Left 02/02/2014   Procedure: Left 4th Ray Amputation vs Transmetatarsal Amputation;  Surgeon: Nadara MustardMarcus Duda V, MD;  [facility];  Service: Orthopedics;  Laterality: Left;  . AMPUTATION Left 03/24/2014   Procedure: AMPUTATION BELOW KNEE;  Surgeon: Cammy CopaGregory Scott Dean, MD;  [facility];  Service: Orthopedics;  Laterality: Left;  . APPENDECTOMY    . CARDIAC CATHETERIZATION    . CORONARY ANGIOPLASTY WITH STENT PLACEMENT     . FEMORAL-TIBIAL BYPASS GRAFT Left 01/28/2014   Procedure:  FEMORAL-ANTERIOR TIBIAL ARTERY Bypass Graft utilizing composite gortex graft and vein graft;  Surgeon: Chuck Hinthristopher S Dickson, MD;  [facility];  Service: Vascular;  Laterality: Left;  . LOWER EXTREMITY ANGIOGRAM Left 01/26/2014   Procedure: LOWER EXTREMITY ANGIOGRAM;  Surgeon: Iran OuchMuhammad A Arida, MD;  [facility] C[facility];  Service: Cardiovascular;  Laterality: Left;  . RIGHT/LEFT HEART CATH AND CORONARY ANGIOGRAPHY N/A 12/02/2017   Procedure: RIGHT/LEFT HEART CATH AND CORONARY ANGIOGRAPHY;  Surgeon: [name], [name], MD;  [facility];  Service: Cardiovascular;  Laterality: N/A;  . TEE WITHOUT CARDIOVERSION N/A 03/28/2014   Procedure: TRANSESOPHAGEAL ECHOCARDIOGRAM (TEE);  Surgeon: [name], MD;  [facility];  Service: Cardiovascular;  Laterality: N/A;     Current Meds  Medication Sig  . acetaminophen (TYLENOL) 325 MG tablet Take 2 tablets (650 mg total) by mouth every 6 (six) hours as needed for mild pain (or Fever >/= 101).  [name]. amLODipine (NORVASC) 10 MG tablet Take 10 mg by mouth daily.  [name]. aspirin 81 MG tablet Take 81 mg by mouth daily.  . furosemide (LASIX) 20 MG tablet Take 40 mg by mouth daily.   [name]. gabapentin (NEURONTIN) 300 MG capsule Take 300 mg by mouth 2 (two) times daily. Has taken up to 4 tabs a day  . insulin aspart (NOVOLOG) 100 UNIT/ML injection Inject 20-40 Units into the skin See admin instructions. Inject 40 units subcutaneously in the morning and 36 units at bedtime. May take additional 20 units at lunch time if blood sugar level is above 150  . insulin glargine (LANTUS) 100 UNIT/ML injection Inject 37-40 Units into the skin 2 (two) times daily. Inject 40 units subcutaneously in the morning and 37 units at night  . isosorbide mononitrate (IMDUR) 30 MG 24 hr tablet Take 30 mg by mouth daily.  [name]. liraglutide (VICTOZA) 18 MG/3ML SOPN Inject 0.6 mg into the skin daily.   [name]. losartan  (COZAAR) 100 MG tablet Take 100 mg by mouth daily.  . metFORMIN (GLUCOPHAGE-XR) 500 MG 24 hr tablet Take 2 tablets (1,000 mg total) by mouth 2 (two) times daily.  . metoprolol (TOPROL-XL) 100 MG 24 hr tablet Take 100 mg by mouth 2 (two) times daily.    . nitroGLYCERIN (NITROSTAT) 0.4 MG SL tablet Place 0.4 mg under the tongue every 5 (five) minutes as needed for chest pain.  . rosuvastatin (CRESTOR) 40 MG tablet Take 40 mg by mouth at bedtime.   . Triamcinolone Acetonide (TRIAMCINOLONE 0.1 % CREAM : EUCERIN) CREA Apply 1 application topically daily.  . Vitamin D, Ergocalciferol, (DRISDOL) 50000 units CAPS capsule Take 50,000 Units by mouth every 7 (seven) days.      Allergies:   Cilostazol   Social History   Tobacco Use  . Smoking status: Former Smoker    Types: Pipe    Quit date: 04/02/1979    Years since quitting: 39.4  . Smokeless tobacco: Never Used  Substance Use Topics  . Alcohol use:  No    Alcohol/week: 0.0 standard drinks  . Drug use: No     Family Hx: The patient's family history includes Cancer in his father, mother, sister, and sister; Diabetes in his mother and sister; Heart disease in his mother; Hyperlipidemia in his mother; Hypertension in his mother.  ROS:   Please see the history of present illness.    No fevers, chills or productive cough. All other systems reviewed and are negative.  Recent Labs: 11/27/2017: Hemoglobin 12.4; Platelets 228 12/04/2017: BUN 17; Creatinine, Ser 1.23; Potassium 4.8; Sodium 136   Recent Lipid Panel Lab Results  Component Value Date/Time   CHOL  04/17/2008 08:28 AM    171        ATP III CLASSIFICATION:  <200     mg/dL   Desirable  161-096200-239  mg/dL   Borderline High  >=045>=240    mg/dL   High          TRIG 409181 (H) 04/17/2008 08:28 AM   HDL 24 (L) 04/17/2008 08:28 AM   CHOLHDL 7.1 04/17/2008 08:28 AM   LDLCALC (H) 04/17/2008 08:28 AM    111        Total Cholesterol/HDL:CHD Risk Coronary Heart Disease Risk Table                      Men   Women  1/2 Average Risk   3.4   3.3  Average Risk       5.0   4.4  2 X Average Risk   9.6   7.1  3 X Average Risk  23.4   11.0        Use the calculated Patient Ratio above and the CHD Risk Table to determine the patient's CHD Risk.        ATP III CLASSIFICATION (LDL):  <100     mg/dL   Optimal  811-914100-129  mg/dL   Near or Above                    Optimal  130-159  mg/dL   Borderline  782-956160-189  mg/dL   High  >213>190     mg/dL   Very High    Wt Readings from Last 3 Encounters:  09/18/18 225 lb (102.1 kg)  12/11/17 239 lb 12.8 oz (108.8 kg)  12/02/17 245 lb (111.1 kg)     Objective:    Vital Signs:  Ht 5\' 9"  (1.753 m)   Wt 225 lb (102.1 kg)   BMI 33.23 kg/m    VITAL SIGNS:  reviewed  No acute distress Answers questions appropriately Normal affect Remainder physical examination not performed (telehealth visit; coronavirus pandemic)  ASSESSMENT & PLAN:    1. Coronary artery disease-no chest pain.  Plan to continue medical therapy with aspirin and statin. 2. History of mild aortic stenosis-patient will need follow-up echocardiograms in the future. 3. Hypertension-Continue present medications and follow. Check Bmet. 4. Hyperlipidemia-continue statin. Check lipids and liver. 5. Carotid artery disease-we will arrange follow-up carotid Dopplers. 6. Peripheral vascular disease-continue aspirin and statin. 7. Dyspnea-as outlined above recent cardiac catheterization revealed unchanged coronary disease and normal left ventricular end-diastolic pressure.  No plans for further cardiac evaluation.  COVID-19 Education: The importance of social distancing was discussed today.  Time:   Today, I have spent 17 minutes with the patient with telehealth technology discussing the above problems.     Medication Adjustments/Labs and Tests Ordered: Current medicines are reviewed at length with the  patient today.  Concerns regarding medicines are outlined above.   Tests Ordered: No  orders of the defined types were placed in this encounter.   Medication Changes: No orders of the defined types were placed in this encounter.   Follow Up:  Virtual Visit or In Person in 6 month(s)  Signed, Olga MillersBrian Manon Banbury, MD  09/18/2018 9:02 AM    [facility]

## 2018-09-17 NOTE — Telephone Encounter (Signed)
Patient stated that he no longer has [employer] insurance/ smartphone/ consent/ my chart/ pre reg completed

## 2018-09-18 ENCOUNTER — Encounter: Payer: Self-pay | Admitting: Cardiology

## 2018-09-18 ENCOUNTER — Telehealth (INDEPENDENT_AMBULATORY_CARE_PROVIDER_SITE_OTHER): Payer: Medicare Other | Admitting: Cardiology

## 2018-09-18 VITALS — Ht 69.0 in | Wt 225.0 lb

## 2018-09-18 DIAGNOSIS — I679 Cerebrovascular disease, unspecified: Secondary | ICD-10-CM | POA: Diagnosis not present

## 2018-09-18 DIAGNOSIS — I2510 Atherosclerotic heart disease of native coronary artery without angina pectoris: Secondary | ICD-10-CM | POA: Diagnosis not present

## 2018-09-18 DIAGNOSIS — I10 Essential (primary) hypertension: Secondary | ICD-10-CM

## 2018-09-18 DIAGNOSIS — I350 Nonrheumatic aortic (valve) stenosis: Secondary | ICD-10-CM

## 2018-09-18 DIAGNOSIS — E7800 Pure hypercholesterolemia, unspecified: Secondary | ICD-10-CM

## 2018-09-18 NOTE — Patient Instructions (Signed)
Medication Instructions:  NO CHANGE If you need a refill on your cardiac medications before your next appointment, please call your pharmacy.   Lab work: Your physician recommends that you return for lab work PRIOR TO EATING If you have labs (blood work) drawn today and your tests are completely normal, you will receive your results only by: [name] MyChart Message (if you have MyChart) OR . A paper copy in the mail If you have any lab test that is abnormal or we need to change your treatment, we will call you to review the results.  Testing/Procedures: Your physician has requested that you have a carotid duplex. This test is an ultrasound of the carotid arteries in your neck. It looks at blood flow through these arteries that supply the brain with blood. Allow one hour for this exam. There are no restrictions or special instructions.  [facility]  Follow-Up: At [facility], you and your health needs are our priority.  As part of our continuing mission to provide you with exceptional heart care, we have created designated Provider Care Teams.  These Care Teams include your primary Cardiologist (physician) and Advanced Practice Providers (APPs -  Physician Assistants and Nurse Practitioners) who all work together to provide you with the care you need, when you need it. You will need a follow up appointment in 6 months.  Please call our office 2 months in advance to schedule this appointment.  You may see Kirk Ruths MD or one of the following Advanced Practice Providers on your designated Care Team:   Kerin Ransom, PA-C Roby Lofts, [place] . Sande Rives, PA-C

## 2018-10-12 LAB — LIPID PANEL

## 2018-10-13 ENCOUNTER — Telehealth: Payer: Self-pay | Admitting: *Deleted

## 2018-10-13 DIAGNOSIS — E7800 Pure hypercholesterolemia, unspecified: Secondary | ICD-10-CM

## 2018-10-13 LAB — LIPID PANEL
Chol/HDL Ratio: 7 ratio — ABNORMAL HIGH (ref 0.0–5.0)
Cholesterol, Total: 189 mg/dL (ref 100–199)
HDL: 27 mg/dL — ABNORMAL LOW (ref >39)
LDL Calculated: 115 mg/dL — ABNORMAL HIGH (ref 0–99)
Triglycerides: 237 mg/dL — ABNORMAL HIGH (ref 0–149)
VLDL Cholesterol Cal: 47 mg/dL — ABNORMAL HIGH (ref 5–40)

## 2018-10-13 LAB — COMPREHENSIVE METABOLIC PANEL
ALT: 50 IU/L — ABNORMAL HIGH (ref 0–44)
AST: 39 IU/L (ref 0–40)
Albumin/Globulin Ratio: 1.4 (ref 1.2–2.2)
Albumin: 4.1 g/dL (ref 3.7–4.7)
Alkaline Phosphatase: 127 IU/L — ABNORMAL HIGH (ref 39–117)
BUN/Creatinine Ratio: 11 (ref 10–24)
BUN: 12 mg/dL (ref 8–27)
Bilirubin Total: 0.3 mg/dL (ref 0.0–1.2)
CO2: 23 mmol/L (ref 20–29)
Calcium: 9.3 mg/dL (ref 8.6–10.2)
Chloride: 102 mmol/L (ref 96–106)
Creatinine, Ser: 1.12 mg/dL (ref 0.76–1.27)
GFR calc Af Amer: 73 mL/min/{1.73_m2} (ref >59)
GFR calc non Af Amer: 63 mL/min/{1.73_m2} (ref >59)
Globulin, Total: 3 g/dL (ref 1.5–4.5)
Glucose: 220 mg/dL — ABNORMAL HIGH (ref 65–99)
Potassium: 4.4 mmol/L (ref 3.5–5.2)
Sodium: 138 mmol/L (ref 134–144)
Total Protein: 7.1 g/dL (ref 6.0–8.5)

## 2018-10-13 MED ORDER — EZETIMIBE 10 MG PO TABS: 10.0000 mg | ORAL_TABLET | Freq: Every day | ORAL | 3 refills | Status: DC

## 2018-10-13 NOTE — Telephone Encounter (Addendum)
-----   Message from Lelon Perla, MD sent at 10/13/2018  9:20 AM EDT ----- Add zetia 10 mg daily; lipids and liver 8 weeks Kirk Ruths  Results released to my chart  New script sent to the pharmacy  Lab orders mailed to the pt

## 2018-10-15 ENCOUNTER — Other Ambulatory Visit: Payer: Self-pay

## 2018-10-15 ENCOUNTER — Ambulatory Visit (HOSPITAL_COMMUNITY)
Admission: RE | Admit: 2018-10-15 | Discharge: 2018-10-15 | Disposition: A | Discharge status: Home / Self Care | Payer: Medicare Other | Source: Ambulatory Visit | Attending: Cardiology | Admitting: Cardiology

## 2018-10-15 ENCOUNTER — Other Ambulatory Visit (HOSPITAL_COMMUNITY): Payer: Self-pay | Admitting: Cardiology

## 2018-10-15 DIAGNOSIS — I6523 Occlusion and stenosis of bilateral carotid arteries: Secondary | ICD-10-CM

## 2018-10-15 DIAGNOSIS — I679 Cerebrovascular disease, unspecified: Secondary | ICD-10-CM | POA: Diagnosis not present

## 2018-10-16 ENCOUNTER — Telehealth: Payer: Self-pay | Admitting: *Deleted

## 2018-10-16 ENCOUNTER — Other Ambulatory Visit: Payer: Self-pay | Admitting: *Deleted

## 2018-10-16 DIAGNOSIS — I679 Cerebrovascular disease, unspecified: Secondary | ICD-10-CM

## 2018-10-16 DIAGNOSIS — E7800 Pure hypercholesterolemia, unspecified: Secondary | ICD-10-CM

## 2018-10-16 NOTE — Telephone Encounter (Signed)
-----   Message from Lelon Perla, MD sent at 10/13/2018  9:20 AM EDT ----- Add zetia 10 mg daily; lipids and liver 8 weeks Kirk Ruths

## 2018-10-16 NOTE — Telephone Encounter (Signed)
Spoke with pt, Aware of dr crenshaw's recommendations. New script sent to the pharmacy and Lab orders mailed to the pt  
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
## 2019-03-29 ENCOUNTER — Ambulatory Visit (INDEPENDENT_AMBULATORY_CARE_PROVIDER_SITE_OTHER): Payer: Medicare Other | Admitting: Adult Health

## 2019-03-29 ENCOUNTER — Encounter: Payer: Self-pay | Admitting: Adult Health

## 2019-03-29 ENCOUNTER — Other Ambulatory Visit: Payer: Self-pay

## 2019-03-29 VITALS — BP 175/75 | HR 68 | Temp 96.3°F | Ht 69.0 in | Wt 225.0 lb

## 2019-03-29 DIAGNOSIS — I2510 Atherosclerotic heart disease of native coronary artery without angina pectoris: Secondary | ICD-10-CM

## 2019-03-29 DIAGNOSIS — M79605 Pain in left leg: Secondary | ICD-10-CM

## 2019-03-29 DIAGNOSIS — I739 Peripheral vascular disease, unspecified: Secondary | ICD-10-CM | POA: Diagnosis not present

## 2019-03-29 DIAGNOSIS — R200 Anesthesia of skin: Secondary | ICD-10-CM | POA: Diagnosis not present

## 2019-03-29 DIAGNOSIS — Z794 Long term (current) use of insulin: Secondary | ICD-10-CM

## 2019-03-29 DIAGNOSIS — M79604 Pain in right leg: Secondary | ICD-10-CM | POA: Diagnosis not present

## 2019-03-29 DIAGNOSIS — I10 Essential (primary) hypertension: Secondary | ICD-10-CM

## 2019-03-29 DIAGNOSIS — E1122 Type 2 diabetes mellitus with diabetic chronic kidney disease: Secondary | ICD-10-CM

## 2019-03-29 MED ORDER — ROSUVASTATIN CALCIUM 40 MG PO TABS: 40.0000 mg | ORAL_TABLET | Freq: Every day | ORAL | 3 refills | Status: DC

## 2019-03-29 NOTE — Patient Instructions (Signed)
Medication Instructions:  Continue current medications  *If you need a refill on your cardiac medications before your next appointment, please call your pharmacy*  Lab Work: None Ordered  Testing/Procedures: None Ordered  Follow-Up: At [facility], you and your health needs are our priority.  As part of our continuing mission to provide you with exceptional heart care, we have created designated Provider Care Teams.  These Care Teams include your primary Cardiologist (physician) and Advanced Practice Providers (APPs -  Physician Assistants and Nurse Practitioners) who all work together to provide you with the care you need, when you need it.  Your next appointment:   6 month(s)  The format for your next appointment:   In Person  Provider:   Kirk Ruths, MD  Other Instructions You have been referred to Dr Scot Dock at [facility]

## 2019-05-19 ENCOUNTER — Encounter: Payer: Self-pay | Admitting: Vascular Surgery

## 2019-05-19 ENCOUNTER — Ambulatory Visit (HOSPITAL_COMMUNITY)
Admission: RE | Admit: 2019-05-19 | Discharge: 2019-05-19 | Disposition: A | Discharge status: Home / Self Care | Payer: Medicare PPO | Source: Ambulatory Visit | Attending: Surgery | Admitting: Surgery

## 2019-05-19 ENCOUNTER — Other Ambulatory Visit: Payer: Self-pay

## 2019-05-19 ENCOUNTER — Ambulatory Visit: Payer: Medicare PPO | Admitting: Vascular Surgery

## 2019-05-19 VITALS — BP 145/74 | HR 64 | Temp 97.3°F | Resp 20 | Ht 69.0 in | Wt 219.0 lb

## 2019-05-19 DIAGNOSIS — I739 Peripheral vascular disease, unspecified: Secondary | ICD-10-CM | POA: Insufficient documentation

## 2019-05-19 NOTE — Progress Notes (Signed)
REASON FOR CONSULT:    Peripheral vascular disease.  The consult is requested by Joni Reining, NP.  ASSESSMENT & PLAN:   PERIPHERAL VASCULAR DISEASE: Based on his exam he has evidence of infrapopliteal arterial occlusive disease on the right.  However he is essentially asymptomatic.  He has no claudication, rest pain, or nonhealing ulcers.  I encouraged him to stay as active as possible.  Fortunately he is not a smoker.  He is on aspirin and is on a statin.  I have ordered follow-up ABIs in 1 year and I will see him back at that time.  Based on his previous arteriogram his only option for revascularization would be angioplasty of the tibioperoneal trunk and proximal peroneal artery which is his only runoff and therefore would be associated with significant risk.  CHRONIC VENOUS INSUFFICIENCY: Based on his exam he is does have evidence of CEAP C4 venous disease.  Given that he has underlying peripheral vascular disease I have recommended only moderate elevation on 1-2 pillows.  He could use a mild compression stocking only.  [name], MD Office: [phone]   HPI:   Jordan [name] is a pleasant 79 y.o. male, who was referred for evaluation of peripheral vascular disease.  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  He is ambulatory with a prosthesis.  He denies any significant claudication in the right calf.  He denies any rest pain or history of nonhealing ulcers.  His biggest complaint has been he does not have good feeling in his right foot.  When he is driving it is difficult to determine if he is on the break.  I think this is more likely related to his neuropathy.  He underwent an arteriogram in 2016 because of a wound on his heel.  On the right side at that time the common femoral and deep femoral artery were patent.  There was mild disease throughout the right superficial femoral artery.  The anterior tibial and posterior tibial arteries were occluded with  single-vessel runoff via the peroneal artery.  There was some focal disease in the tibial peroneal trunk proximally and mild disease in the peroneal artery distally.  His left below the knee amputation was done by Dr. Aldean Baker.  His risk factors for peripheral vascular disease include type 2 diabetes, hypertension, hypercholesterolemia.  He denies any family history of premature cardiovascular disease.  He is currently not a smoker.  Past Medical History:  Diagnosis Date  . Anemia   . Aortic stenosis   . CAD (coronary artery disease)    a. s/p IMI in past tx with POBA and cath 6 mos later with occluded RCA;  b. h/o Taxus DES to LAD and CFX;  c.  cath 1/10: LM ok, mLAD 40-50%, LAD stent ok, D2 tandem 60-70%, pCFX 30%, mAVCFX stent ok, pRCA 40%, RV 90%, RV marginal 90%, dRCA filled L-R collats tx medically  . Carotid stenosis    dopplers 5/11: 40-59% bilat  . CHF (congestive heart failure) (HCC)   . Diabetic ulcer of left foot (HCC)   . DM2 (diabetes mellitus, type 2) (HCC)   . Heart murmur   . HLD (hyperlipidemia)   . HTN (hypertension)   . Myocardial infarction (HCC) 1995 and 2005   Heart Attack  . Osteomyelitis of left foot (HCC)   . Staphylococcus aureus bacteremia with sepsis [facility])     Family History  Problem Relation Age of Onset  . Diabetes Mother   .  Cancer Mother        Pancreatic  . Heart disease Mother        After age 75  . Hypertension Mother   . Hyperlipidemia Mother   . Diabetes Sister   . Cancer Sister   . Cancer Sister        Bone  . Cancer Father     SOCIAL HISTORY: Social History   Socioeconomic History  . Marital status: Married    Spouse name: Not on file  . Number of children: 1  . Years of education: Not on file  . Highest education level: Not on file  Occupational History  . Not on file  Tobacco Use  . Smoking status: Former Smoker    Types: Pipe    Quit date: 04/02/1979    Years since quitting: 40.1  . Smokeless tobacco: Never Used   Substance and Sexual Activity  . Alcohol use: No    Alcohol/week: 0.0 standard drinks  . Drug use: No  . Sexual activity: Yes    Birth control/protection: None  Other Topics Concern  . Not on file  Social History Narrative  . Not on file   Social Determinants of Health   Financial Resource Strain:   . Difficulty of Paying Living Expenses: Not on file  Food Insecurity:   . Worried About Charity fundraiser in the Last Year: Not on file  . Ran Out of Food in the Last Year: Not on file  Transportation Needs:   . Lack of Transportation (Medical): Not on file  . Lack of Transportation (Non-Medical): Not on file  Physical Activity:   . Days of Exercise per Week: Not on file  . Minutes of Exercise per Session: Not on file  Stress:   . Feeling of Stress : Not on file  Social Connections:   . Frequency of Communication with Friends and Family: Not on file  . Frequency of Social Gatherings with Friends and Family: Not on file  . Attends Religious Services: Not on file  . Active Member of Clubs or Organizations: Not on file  . Attends Archivist Meetings: Not on file  . Marital Status: Not on file  Intimate Partner Violence:   . Fear of Current or Ex-Partner: Not on file  . Emotionally Abused: Not on file  . Physically Abused: Not on file  . Sexually Abused: Not on file    Allergies  Allergen Reactions  . Cilostazol Other (See Comments)    Feels like feet on fire / Pain in feet    Current Outpatient Medications  Medication Sig Dispense Refill  . acetaminophen (TYLENOL) 325 MG tablet Take 2 tablets (650 mg total) by mouth every 6 (six) hours as needed for mild pain (or Fever >/= 101).    [name] amLODipine (NORVASC) 10 MG tablet Take 10 mg by mouth daily.    [name] aspirin 81 MG tablet Take 81 mg by mouth daily.    . furosemide (LASIX) 20 MG tablet Take 40 mg by mouth daily.     [name] gabapentin (NEURONTIN) 300 MG capsule Take 300 mg by mouth 2 (two) times daily. Has taken up to  4 tabs a day    . insulin aspart (NOVOLOG) 100 UNIT/ML injection Inject 20-40 Units into the skin See admin instructions. Inject 40 units subcutaneously in the morning and 36 units at bedtime. May take additional 20 units at lunch time if blood sugar level is above 150    .  insulin glargine (LANTUS) 100 UNIT/ML injection Inject 37-40 Units into the skin 2 (two) times daily. Inject 40 units subcutaneously in the morning and 37 units at night    . isosorbide mononitrate (IMDUR) 30 MG 24 hr tablet Take 30 mg by mouth daily.    [name] liraglutide (VICTOZA) 18 MG/3ML SOPN Inject 0.6 mg into the skin daily.     [name] losartan (COZAAR) 100 MG tablet Take 100 mg by mouth daily.    . metFORMIN (GLUCOPHAGE-XR) 500 MG 24 hr tablet Take 2 tablets (1,000 mg total) by mouth 2 (two) times daily.    . metoprolol (TOPROL-XL) 100 MG 24 hr tablet Take 100 mg by mouth 2 (two) times daily.      . nitroGLYCERIN (NITROSTAT) 0.4 MG SL tablet Place 0.4 mg under the tongue every 5 (five) minutes as needed for chest pain.    . rosuvastatin (CRESTOR) 40 MG tablet Take 1 tablet (40 mg total) by mouth at bedtime. 90 tablet 3  . Triamcinolone Acetonide (TRIAMCINOLONE 0.1 % CREAM : EUCERIN) CREA Apply 1 application topically daily.    . Vitamin D, Ergocalciferol, (DRISDOL) 1.25 MG (50000 UNIT) CAPS capsule     . ezetimibe (ZETIA) 10 MG tablet Take 1 tablet (10 mg total) by mouth daily. 90 tablet 3  . metFORMIN (GLUCOPHAGE-XR) 500 MG 24 hr tablet Take 1,000 mg by mouth 2 (two) times daily.     No current facility-administered medications for this visit.    REVIEW OF SYSTEMS:  [X]  denotes positive finding, [ ]  denotes negative finding Cardiac  Comments:  Chest pain or chest pressure:    Shortness of breath upon exertion: x   Short of breath when lying flat:    Irregular heart rhythm:        Vascular    Pain in calf, thigh, or hip brought on by ambulation: x   Pain in feet at night that wakes you up from your sleep:  x   Blood  clot in your veins:    Leg swelling:         Pulmonary    Oxygen at home:    Productive cough:     Wheezing:         Neurologic    Sudden weakness in arms or legs:     Sudden numbness in arms or legs:     Sudden onset of difficulty speaking or slurred speech:    Temporary loss of vision in one eye:     Problems with dizziness:         Gastrointestinal    Blood in stool:     Vomited blood:         Genitourinary    Burning when urinating:     Blood in urine:        Psychiatric    Major depression:         Hematologic    Bleeding problems:    Problems with blood clotting too easily:        Skin    Rashes or ulcers:        Constitutional    Fever or chills:     PHYSICAL EXAM:   Vitals:   05/19/19 1441  BP: (!) 145/74  Pulse: 64  Resp: 20  Temp: (!) 97.3 F (36.3 C)  SpO2: 96%  Weight: 219 lb (99.3 kg)  Height: 5\' 9"  (1.753 m)    GENERAL: The patient is a well-nourished male, in no acute distress. The vital  signs are documented above. CARDIAC: There is a regular rate and rhythm.  He has a systolic ejection murmur. VASCULAR: I do not detect carotid bruits. On the right side he has a palpable femoral extra and popliteal pulse.  The right foot is warm and well-perfused.  He has no ischemic ulcers.  He has mild right lower extremity swelling. He has hyperpigmentation consistent with chronic venous insufficiency. PULMONARY: There is good air exchange bilaterally without wheezing or rales. ABDOMEN: Soft and non-tender with normal pitched bowel sounds.  MUSCULOSKELETAL: He has a left below the knee amputation. NEUROLOGIC: No focal weakness or paresthesias are detected. SKIN: There are no ulcers or rashes noted. PSYCHIATRIC: The patient has a normal affect.  DATA:    ARTERIAL DOPPLER STUDY: I have independently interpreted the patient's arterial Doppler study today.  Patient has a left below the knee amputation.  On the right side there is a monophasic dorsalis  pedis and posterior tibial signal with an ABI of 77% and a toe pressure of 88 mmHg.

## 2019-05-21 ENCOUNTER — Other Ambulatory Visit: Payer: Self-pay | Admitting: *Deleted

## 2019-05-21 DIAGNOSIS — I739 Peripheral vascular disease, unspecified: Secondary | ICD-10-CM

## 2019-11-03 ENCOUNTER — Ambulatory Visit (HOSPITAL_COMMUNITY)
Admission: RE | Admit: 2019-11-03 | Discharge: 2019-11-03 | Disposition: A | Discharge status: Home / Self Care | Payer: Medicare PPO | Source: Ambulatory Visit | Attending: Cardiovascular Disease | Admitting: Cardiovascular Disease

## 2019-11-03 ENCOUNTER — Other Ambulatory Visit: Payer: Self-pay

## 2019-11-03 DIAGNOSIS — I679 Cerebrovascular disease, unspecified: Secondary | ICD-10-CM

## 2020-03-30 ENCOUNTER — Inpatient Hospital Stay (HOSPITAL_COMMUNITY)
Admission: EM | Admit: 2020-03-30 | Discharge: 2020-04-07 | DRG: 065 | Disposition: A | Discharge status: Rehabilitation Facility | Payer: No Typology Code available for payment source | Attending: Internal Medicine | Admitting: Internal Medicine

## 2020-03-30 ENCOUNTER — Encounter (HOSPITAL_COMMUNITY): Payer: Self-pay | Admitting: Emergency Medicine

## 2020-03-30 ENCOUNTER — Telehealth: Payer: Self-pay | Admitting: Cardiology

## 2020-03-30 DIAGNOSIS — Z7902 Long term (current) use of antithrombotics/antiplatelets: Secondary | ICD-10-CM

## 2020-03-30 DIAGNOSIS — E785 Hyperlipidemia, unspecified: Secondary | ICD-10-CM | POA: Diagnosis present

## 2020-03-30 DIAGNOSIS — Z9181 History of falling: Secondary | ICD-10-CM

## 2020-03-30 DIAGNOSIS — I2510 Atherosclerotic heart disease of native coronary artery without angina pectoris: Secondary | ICD-10-CM | POA: Diagnosis present

## 2020-03-30 DIAGNOSIS — I350 Nonrheumatic aortic (valve) stenosis: Secondary | ICD-10-CM | POA: Diagnosis present

## 2020-03-30 DIAGNOSIS — E1142 Type 2 diabetes mellitus with diabetic polyneuropathy: Secondary | ICD-10-CM | POA: Diagnosis present

## 2020-03-30 DIAGNOSIS — I252 Old myocardial infarction: Secondary | ICD-10-CM

## 2020-03-30 DIAGNOSIS — Z955 Presence of coronary angioplasty implant and graft: Secondary | ICD-10-CM

## 2020-03-30 DIAGNOSIS — E538 Deficiency of other specified B group vitamins: Secondary | ICD-10-CM | POA: Diagnosis present

## 2020-03-30 DIAGNOSIS — E1169 Type 2 diabetes mellitus with other specified complication: Secondary | ICD-10-CM | POA: Diagnosis present

## 2020-03-30 DIAGNOSIS — I639 Cerebral infarction, unspecified: Secondary | ICD-10-CM | POA: Diagnosis present

## 2020-03-30 DIAGNOSIS — I6381 Other cerebral infarction due to occlusion or stenosis of small artery: Secondary | ICD-10-CM | POA: Diagnosis not present

## 2020-03-30 DIAGNOSIS — Z6833 Body mass index (BMI) 33.0-33.9, adult: Secondary | ICD-10-CM

## 2020-03-30 DIAGNOSIS — Z8249 Family history of ischemic heart disease and other diseases of the circulatory system: Secondary | ICD-10-CM

## 2020-03-30 DIAGNOSIS — Z87891 Personal history of nicotine dependence: Secondary | ICD-10-CM

## 2020-03-30 DIAGNOSIS — Z7984 Long term (current) use of oral hypoglycemic drugs: Secondary | ICD-10-CM

## 2020-03-30 DIAGNOSIS — I672 Cerebral atherosclerosis: Secondary | ICD-10-CM | POA: Diagnosis present

## 2020-03-30 DIAGNOSIS — E1122 Type 2 diabetes mellitus with diabetic chronic kidney disease: Secondary | ICD-10-CM | POA: Diagnosis present

## 2020-03-30 DIAGNOSIS — I10 Essential (primary) hypertension: Secondary | ICD-10-CM | POA: Diagnosis present

## 2020-03-30 DIAGNOSIS — R0602 Shortness of breath: Secondary | ICD-10-CM

## 2020-03-30 DIAGNOSIS — R471 Dysarthria and anarthria: Secondary | ICD-10-CM | POA: Diagnosis present

## 2020-03-30 DIAGNOSIS — R600 Localized edema: Secondary | ICD-10-CM | POA: Diagnosis present

## 2020-03-30 DIAGNOSIS — E1165 Type 2 diabetes mellitus with hyperglycemia: Secondary | ICD-10-CM | POA: Diagnosis present

## 2020-03-30 DIAGNOSIS — Z809 Family history of malignant neoplasm, unspecified: Secondary | ICD-10-CM

## 2020-03-30 DIAGNOSIS — R1312 Dysphagia, oropharyngeal phase: Secondary | ICD-10-CM | POA: Diagnosis present

## 2020-03-30 DIAGNOSIS — G8191 Hemiplegia, unspecified affecting right dominant side: Secondary | ICD-10-CM | POA: Diagnosis present

## 2020-03-30 DIAGNOSIS — I63212 Cerebral infarction due to unspecified occlusion or stenosis of left vertebral arteries: Principal | ICD-10-CM

## 2020-03-30 DIAGNOSIS — Z89512 Acquired absence of left leg below knee: Secondary | ICD-10-CM

## 2020-03-30 DIAGNOSIS — R29810 Facial weakness: Secondary | ICD-10-CM | POA: Diagnosis present

## 2020-03-30 DIAGNOSIS — Z79899 Other long term (current) drug therapy: Secondary | ICD-10-CM

## 2020-03-30 DIAGNOSIS — Z794 Long term (current) use of insulin: Secondary | ICD-10-CM

## 2020-03-30 DIAGNOSIS — N179 Acute kidney failure, unspecified: Secondary | ICD-10-CM | POA: Diagnosis present

## 2020-03-30 DIAGNOSIS — Z833 Family history of diabetes mellitus: Secondary | ICD-10-CM

## 2020-03-30 DIAGNOSIS — Z7982 Long term (current) use of aspirin: Secondary | ICD-10-CM

## 2020-03-30 DIAGNOSIS — R296 Repeated falls: Secondary | ICD-10-CM | POA: Diagnosis present

## 2020-03-30 DIAGNOSIS — R32 Unspecified urinary incontinence: Secondary | ICD-10-CM | POA: Diagnosis present

## 2020-03-30 DIAGNOSIS — N1832 Chronic kidney disease, stage 3b: Secondary | ICD-10-CM | POA: Diagnosis present

## 2020-03-30 DIAGNOSIS — I6521 Occlusion and stenosis of right carotid artery: Secondary | ICD-10-CM | POA: Diagnosis present

## 2020-03-30 DIAGNOSIS — R531 Weakness: Secondary | ICD-10-CM | POA: Diagnosis present

## 2020-03-30 DIAGNOSIS — I5032 Chronic diastolic (congestive) heart failure: Secondary | ICD-10-CM | POA: Diagnosis present

## 2020-03-30 DIAGNOSIS — Z888 Allergy status to other drugs, medicaments and biological substances status: Secondary | ICD-10-CM

## 2020-03-30 DIAGNOSIS — Z83438 Family history of other disorder of lipoprotein metabolism and other lipidemia: Secondary | ICD-10-CM

## 2020-03-30 DIAGNOSIS — K5900 Constipation, unspecified: Secondary | ICD-10-CM | POA: Diagnosis not present

## 2020-03-30 DIAGNOSIS — Z20822 Contact with and (suspected) exposure to covid-19: Secondary | ICD-10-CM | POA: Diagnosis present

## 2020-03-30 DIAGNOSIS — I130 Hypertensive heart and chronic kidney disease with heart failure and stage 1 through stage 4 chronic kidney disease, or unspecified chronic kidney disease: Secondary | ICD-10-CM | POA: Diagnosis present

## 2020-03-30 DIAGNOSIS — E669 Obesity, unspecified: Secondary | ICD-10-CM | POA: Diagnosis present

## 2020-03-30 LAB — COMPREHENSIVE METABOLIC PANEL
ALT: 45 U/L — ABNORMAL HIGH (ref 0–44)
AST: 37 U/L (ref 15–41)
Albumin: 3.5 g/dL (ref 3.5–5.0)
Alkaline Phosphatase: 112 U/L (ref 38–126)
Anion gap: 11 (ref 5–15)
BUN: 15 mg/dL (ref 8–23)
CO2: 23 mmol/L (ref 22–32)
Calcium: 9.2 mg/dL (ref 8.9–10.3)
Chloride: 104 mmol/L (ref 98–111)
Creatinine, Ser: 1.43 mg/dL — ABNORMAL HIGH (ref 0.61–1.24)
GFR, Estimated: 50 mL/min — ABNORMAL LOW (ref >60)
Glucose, Bld: 313 mg/dL — ABNORMAL HIGH (ref 70–99)
Potassium: 4.1 mmol/L (ref 3.5–5.1)
Sodium: 138 mmol/L (ref 135–145)
Total Bilirubin: 0.4 mg/dL (ref 0.3–1.2)
Total Protein: 7.5 g/dL (ref 6.5–8.1)

## 2020-03-30 LAB — PROTIME-INR
INR: 1.1 (ref 0.8–1.2)
Prothrombin Time: 13.6 seconds (ref 11.4–15.2)

## 2020-03-30 LAB — CBC
HCT: 40.9 % (ref 39.0–52.0)
Hemoglobin: 12.7 g/dL — ABNORMAL LOW (ref 13.0–17.0)
MCH: 28 pg (ref 26.0–34.0)
MCHC: 31.1 g/dL (ref 30.0–36.0)
MCV: 90.1 fL (ref 80.0–100.0)
Platelets: 223 10*3/uL (ref 150–400)
RBC: 4.54 MIL/uL (ref 4.22–5.81)
RDW: 15.3 % (ref 11.5–15.5)
WBC: 6.2 10*3/uL (ref 4.0–10.5)
nRBC: 0 % (ref 0.0–0.2)

## 2020-03-30 LAB — DIFFERENTIAL
Abs Immature Granulocytes: 0.01 10*3/uL (ref 0.00–0.07)
Basophils Absolute: 0.1 10*3/uL (ref 0.0–0.1)
Basophils Relative: 1 %
Eosinophils Absolute: 0.2 10*3/uL (ref 0.0–0.5)
Eosinophils Relative: 4 %
Immature Granulocytes: 0 %
Lymphocytes Relative: 38 %
Lymphs Abs: 2.4 10*3/uL (ref 0.7–4.0)
Monocytes Absolute: 0.4 10*3/uL (ref 0.1–1.0)
Monocytes Relative: 7 %
Neutro Abs: 3.2 10*3/uL (ref 1.7–7.7)
Neutrophils Relative %: 50 %

## 2020-03-30 LAB — APTT: aPTT: 26 seconds (ref 24–36)

## 2020-03-30 NOTE — Telephone Encounter (Signed)
Spoke with pt wife, they took him to the hospital yesterday because his mouth was drooped and she was afraid he had had a stroke. They checked everything and found nothing wrong but he contuies to have problems. Advised we have seen the patient in over a year and so she is going to call 911 and have him taken to [place].

## 2020-03-30 NOTE — ED Provider Notes (Signed)
[facility] EMERGENCY DEPARTMENT Provider Note  CSN: [id number] Arrival date & time: 03/30/20 1521  Chief Complaint(s) Facial Droop and Hyperglycemia  HPI Jordan [name] is a 79 y.o. male with a past medical history listed below who presents to the emergency department with 2 days of generalized fatigue and recurrent falls. Patient and wife report that the symptoms began 2 days ago after going to a funeral. Wife reports that the patient's has been feeling more sleepy than normal. She is also noted facial changes and feels like his speech is slurred. Patient reports that he feels like his right leg is weaker. Falls have occurred while patient was trying to get out of bed. Patient has a left BKA.  Normally walks with a cane or a walker.  At baseline has some balance and gait instability due to peripheral neuropathy but this has significantly worsened over the past 2 days. The falls have not resulted in any head trauma or loss of consciousness. Patient has not had any fevers or chills. No coughing or congestion. No nausea vomiting. No chest pain or shortness of breath. No abdominal pain or diarrhea. No urinary symptoms.  Patient was actually seen at [facility] for this and had reassuring work-up with a negative CT head.  CBC without anemia or leukocytosis.  Metabolic panel notable for mild hyperglycemia without evidence of DKA or AKI.  UA without infection.   HPI  Past Medical History Past Medical History:  Diagnosis Date  . Anemia   . Aortic stenosis   . CAD (coronary artery disease)    a. s/p IMI in past tx with POBA and cath 6 mos later with occluded RCA;  b. h/o Taxus DES to LAD and CFX;  c.  cath 1/10: LM ok, mLAD 40-50%, LAD stent ok, D2 tandem 60-70%, pCFX 30%, mAVCFX stent ok, pRCA 40%, RV 90%, RV marginal 90%, dRCA filled L-R collats tx medically  . Carotid stenosis    dopplers 5/11: 40-59% bilat  . CHF (congestive heart failure) (HCC)   . Diabetic  ulcer of left foot (HCC)   . DM2 (diabetes mellitus, type 2) (HCC)   . Heart murmur   . HLD (hyperlipidemia)   . HTN (hypertension)   . Myocardial infarction (HCC) 1995 and 2005   Heart Attack  . Osteomyelitis of left foot (HCC)   . Staphylococcus aureus bacteremia with sepsis [facility])    Patient Active Problem List   Diagnosis Date Noted  . Stroke (HCC) 03/31/2020  . Health care maintenance 06/26/2016  . Wound drainage-Right foot 06/02/2014  . Pressure sore-Right heel 06/02/2014  . Fissure in skin of foot-Right lateral 06/02/2014  . Diabetic ulcer of left foot (HCC)   . Bacteremia   . Staphylococcus aureus bacteremia with sepsis (HCC)   . Osteomyelitis of left foot (HCC) 03/24/2014  . Foot infection 03/24/2014  . Chronic diastolic heart failure (HCC) 03/23/2014  . Protein-calorie malnutrition, severe (HCC) 03/21/2014  . SIRS (systemic inflammatory response syndrome) (HCC) 03/21/2014  . Infected wound 03/20/2014  . Atherosclerosis of native arteries of the extremities with gangrene (HCC) 02/17/2014  . Diabetic ulcer of left foot associated with diabetes mellitus due to underlying condition (HCC)   . Type 2 diabetes mellitus with diabetic chronic kidney disease (HCC)   . Diabetic foot ulcer (HCC) 01/24/2014  . Diabetic osteomyelitis (HCC) 01/24/2014  . Acute renal failure (HCC) 01/24/2014  . CKD (chronic kidney disease), stage III (HCC) 01/24/2014  . Leukocytosis 01/24/2014  . Fever presenting  with conditions classified elsewhere 01/24/2014  . Diarrhea 01/24/2014  . Gangrene (HCC) 01/24/2014  . PAD (peripheral artery disease) (HCC) 03/02/2013  . Aortic stenosis 01/09/2011  . Right flank pain 12/19/2010  . Murmur 12/19/2010  . DM (diabetes mellitus), type 2, uncontrolled, periph vascular complic (HCC) 02/02/2009  . Hyperlipidemia 02/02/2009  . Essential hypertension 02/02/2009  . MI 02/02/2009  . Coronary atherosclerosis 02/02/2009  . Cerebrovascular disease 02/02/2009  .  Peripheral vascular disease (HCC) 02/02/2009  . DYSPNEA 02/02/2009  . CHEST PAIN 02/02/2009   Home Medication(s) Prior to Admission medications   Medication Sig Start Date End Date Taking? Authorizing Provider  acetaminophen (TYLENOL) 325 MG tablet Take 2 tablets (650 mg total) by mouth every 6 (six) hours as needed for mild pain (or Fever >/= 101). 02/07/14   Elgergawy, Leana Roeawood S, MD  amLODipine (NORVASC) 10 MG tablet Take 10 mg by mouth daily.    [provider]  aspirin 81 MG tablet Take 81 mg by mouth daily.    [provider]  ezetimibe (ZETIA) 10 MG tablet Take 1 tablet (10 mg total) by mouth daily. 10/13/18 01/11/19  Lewayne Buntingrenshaw, Brian S, MD  furosemide (LASIX) 20 MG tablet Take 40 mg by mouth daily.  05/19/15   [provider]  gabapentin (NEURONTIN) 300 MG capsule Take 300 mg by mouth 2 (two) times daily. Has taken up to 4 tabs a day    [provider]  insulin aspart (NOVOLOG) 100 UNIT/ML injection Inject 20-40 Units into the skin See admin instructions. Inject 40 units subcutaneously in the morning and 36 units at bedtime. May take additional 20 units at lunch time if blood sugar level is above 150    [provider]  insulin glargine (LANTUS) 100 UNIT/ML injection Inject 37-40 Units into the skin 2 (two) times daily. Inject 40 units subcutaneously in the morning and 37 units at night    [provider]  isosorbide mononitrate (IMDUR) 30 MG 24 hr tablet Take 30 mg by mouth daily.    [provider]  liraglutide (VICTOZA) 18 MG/3ML SOPN Inject 0.6 mg into the skin daily.  11/13/17   [provider]  losartan (COZAAR) 100 MG tablet Take 100 mg by mouth daily. 01/23/14   [provider]  metFORMIN (GLUCOPHAGE-XR) 500 MG 24 hr tablet Take 1,000 mg by mouth 2 (two) times daily. 12/19/15 12/18/16  [provider]  metFORMIN (GLUCOPHAGE-XR) 500 MG 24 hr tablet Take 2 tablets (1,000 mg total) by mouth 2 (two) times  daily. 12/05/17   [name], [name], MD  metoprolol (TOPROL-XL) 100 MG 24 hr tablet Take 100 mg by mouth 2 (two) times daily.      [provider]  nitroGLYCERIN (NITROSTAT) 0.4 MG SL tablet Place 0.4 mg under the tongue every 5 (five) minutes as needed for chest pain.    [provider]  rosuvastatin (CRESTOR) 40 MG tablet Take 1 tablet (40 mg total) by mouth at bedtime. 03/29/19   Jodelle GrossLawrence, Kathryn M, NP  Triamcinolone Acetonide (TRIAMCINOLONE 0.1 % CREAM : EUCERIN) CREA Apply 1 application topically daily.    [provider]  Vitamin D, Ergocalciferol, (DRISDOL) 1.25 MG (50000 UNIT) CAPS capsule  05/02/19   [provider]  Past Surgical History Past Surgical History:  Procedure Laterality Date  . ABDOMINAL AORTAGRAM N/A 04/07/2013   Procedure: ABDOMINAL [name];  Surgeon: [name], MD;  Location: [facility] CATH LAB;  Service: Cardiovascular;  Laterality: N/A;  . ABDOMINAL AORTAGRAM N/A 01/26/2014   Procedure: ABDOMINAL [name];  Surgeon: [name], MD;  Location: [facility] CATH LAB;  Service: Cardiovascular;  Laterality: N/A;  . ABDOMINAL AORTAGRAM N/A 06/13/2014   Procedure: ABDOMINAL [name];  Surgeon: [name], MD;  Location: [facility] CATH LAB;  Service: Cardiovascular;  Laterality: N/A;  . AMPUTATION Left 01/28/2014   Procedure: AMPUTATION DIGIT- LEFT 5TH TOE;  Surgeon: [name], MD;  Location: [facility] OR;  Service: Vascular;  Laterality: Left;  . AMPUTATION Left 02/02/2014   Procedure: Left 4th Ray Amputation vs Transmetatarsal Amputation;  Surgeon: [name], MD;  Location: [facility] OR;  Service: Orthopedics;  Laterality: Left;  . AMPUTATION Left 03/24/2014   Procedure: AMPUTATION BELOW KNEE;  Surgeon: [name], MD;  Location: [facility] OR;  Service: Orthopedics;  Laterality: Left;  . APPENDECTOMY    .  CARDIAC CATHETERIZATION    . CORONARY ANGIOPLASTY WITH STENT PLACEMENT    . FEMORAL-TIBIAL BYPASS GRAFT Left 01/28/2014   Procedure:  FEMORAL-ANTERIOR TIBIAL ARTERY Bypass Graft utilizing composite gortex graft and vein graft;  Surgeon: [name], MD;  Location: [facility] OR;  Service: Vascular;  Laterality: Left;  . LOWER EXTREMITY ANGIOGRAM Left 01/26/2014   Procedure: LOWER EXTREMITY ANGIOGRAM;  Surgeon: [name], MD;  Location: [facility] CATH LAB;  Service: Cardiovascular;  Laterality: Left;  . RIGHT/LEFT HEART CATH AND CORONARY ANGIOGRAPHY N/A 12/02/2017   Procedure: RIGHT/LEFT HEART CATH AND CORONARY ANGIOGRAPHY;  Surgeon: [place], [name], MD;  Location: [facility] INVASIVE CV LAB;  Service: Cardiovascular;  Laterality: N/A;  . TEE WITHOUT CARDIOVERSION N/A 03/28/2014   Procedure: TRANSESOPHAGEAL ECHOCARDIOGRAM (TEE);  Surgeon: Quintella Reichert, MD;  Location: [facility] ENDOSCOPY;  Service: Cardiovascular;  Laterality: N/A;   Family History Family History  Problem Relation Age of Onset  . Diabetes Mother   . Cancer Mother        Pancreatic  . Heart disease Mother        After age 50  . Hypertension Mother   . Hyperlipidemia Mother   . Diabetes Sister   . Cancer Sister   . Cancer Sister        Bone  . Cancer Father     Social History Social History   Tobacco Use  . Smoking status: Former Smoker    Types: Pipe    Quit date: 04/02/1979    Years since quitting: 41.0  . Smokeless tobacco: Never Used  Vaping Use  . Vaping Use: Never used  Substance Use Topics  . Alcohol use: No    Alcohol/week: 0.0 standard drinks  . Drug use: No   Allergies Cilostazol  Review of Systems Review of Systems All other systems are reviewed and are negative for acute change except as noted in the HPI  Physical Exam Vital Signs  I have reviewed the triage vital signs BP 137/63 (BP Location: Left Arm)   Pulse 63   Temp 98.6 F (37 C) (Oral)   Resp 17   SpO2 98%   Physical Exam Vitals reviewed.   Constitutional:      General: He is not in acute distress.    Appearance: He is well-developed and well-nourished. He is not diaphoretic.  HENT:     Head: Normocephalic and atraumatic.  Nose: Nose normal.  Eyes:     General: No scleral icterus.       Right eye: No discharge.        Left eye: No discharge.     Extraocular Movements: EOM normal.     Conjunctiva/sclera: Conjunctivae normal.     Pupils: Pupils are equal, round, and reactive to light.  Cardiovascular:     Rate and Rhythm: Normal rate and regular rhythm.     Heart sounds: No murmur heard. No friction rub. No gallop.   Pulmonary:     Effort: Pulmonary effort is normal. No respiratory distress.     Breath sounds: Normal breath sounds. No stridor. No rales.  Abdominal:     General: There is no distension.     Palpations: Abdomen is soft.     Tenderness: There is no abdominal tenderness.  Musculoskeletal:        General: No tenderness or edema.     Cervical back: Normal range of motion and neck supple.       Legs:  Skin:    General: Skin is warm and dry.     Findings: No erythema or rash.  Neurological:     Mental Status: He is alert and oriented to person, place, and time.     Comments: Mental Status:  Alert and oriented to person, place, and time.  Attention and concentration slowed  Speech clear, but slowed  Recent memory is intact  Cranial Nerves:  II Visual Fields: Intact to confrontation. Visual fields intact. III, IV, VI: Pupils equal and reactive to light and near. Full eye movement without nystagmus  V Facial Sensation: Normal. No weakness of masticatory muscles  VII: right facial droop not involving the forehead.  VIII Auditory Acuity: Grossly normal  IX/X: The uvula is midline; the palate elevates symmetrically  XI: Normal sternocleidomastoid and trapezius strength  XII: The tongue is midline. No atrophy or fasciculations.   Motor System: Muscle Strength: 4/5 to RLE. 5/5 to LLE. 5/5 and  symmetric in the upper. No pronation or drift.  Muscle Tone: Tone and muscle bulk are normal in the upper and lower extremities.   Reflexes: DTRs: 1+ and symmetrical in all four extremities. No Clonus Coordination: Intact finger-to-nose, heel-to-shin. No tremor.  Sensation: Intact to light touch.  Gait: unable to assess due his instability.    Psychiatric:        Mood and Affect: Mood and affect normal.     ED Results and Treatments Labs (all labs ordered are listed, but only abnormal results are displayed) Labs Reviewed  CBC - Abnormal; Notable for the following components:      Result Value   Hemoglobin 12.7 (*)    All other components within normal limits  COMPREHENSIVE METABOLIC PANEL - Abnormal; Notable for the following components:   Glucose, Bld 313 (*)    Creatinine, Ser 1.43 (*)    ALT 45 (*)    GFR, Estimated 50 (*)    All other components within normal limits  VITAMIN B12 - Abnormal; Notable for the following components:   Vitamin B-12 163 (*)    All other components within normal limits  CBG MONITORING, ED - Abnormal; Notable for the following components:   Glucose-Capillary 185 (*)    All other components within normal limits  TROPONIN I (HIGH SENSITIVITY) - Abnormal; Notable for the following components:   Troponin I (High Sensitivity) 21 (*)    All other components within normal limits  RESP PANEL  BY RT-PCR (FLU A&B, COVID) ARPGX2  PROTIME-INR  APTT  DIFFERENTIAL  BRAIN NATRIURETIC PEPTIDE  URINALYSIS, ROUTINE W REFLEX MICROSCOPIC  TROPONIN I (HIGH SENSITIVITY)                                                                                                                         EKG  EKG Interpretation  Date/Time:  Thursday March 30 2020 15:22:10 EST Ventricular Rate:  82 PR Interval:  172 QRS Duration: 86 QT Interval:  386 QTC Calculation: 450 R Axis:   -63 Text Interpretation: Normal sinus rhythm Left axis deviation Possible Lateral infarct ,  age undetermined Inferior infarct , age undetermined Abnormal ECG similar to 05/2003 and 05/2009 tracings Confirmed by Drema Pry [phone]) on 03/30/2020 11:00:40 PM      Radiology DG Chest 2 View  Result Date: 03/31/2020 CLINICAL DATA:  Shortness of breath EXAM: CHEST - 2 VIEW COMPARISON:  03/20/2014 FINDINGS: Shallow lung inflation. The heart size and mediastinal contours are within normal limits. Both lungs are clear. The visualized skeletal structures are unremarkable. IMPRESSION: No active cardiopulmonary disease. Electronically Signed   By: Deatra Robinson M.D.   On: 03/31/2020 00:24   MR BRAIN WO CONTRAST  Result Date: 03/31/2020 CLINICAL DATA:  Left-sided facial droop EXAM: MRI HEAD WITHOUT CONTRAST TECHNIQUE: Multiplanar, multiecho pulse sequences of the brain and surrounding structures were obtained without intravenous contrast. COMPARISON:  None. FINDINGS: Brain: There is a small left gangliothalamic acute infarct. No acute or chronic hemorrhage. There is multifocal hyperintense T2-weighted signal within the white matter. Generalized volume loss without a clear lobar predilection. The midline structures are normal. Vascular: Major flow voids are preserved. Skull and upper cervical spine: Normal calvarium and skull base. Visualized upper cervical spine and soft tissues are normal. Sinuses/Orbits:No paranasal sinus fluid levels or advanced mucosal thickening. No mastoid or middle ear effusion. Normal orbits. IMPRESSION: Small left gangliothalamic acute infarct. No hemorrhage or mass effect. Electronically Signed   By: Deatra Robinson M.D.   On: 03/31/2020 03:52    Pertinent labs & imaging results that were available during my care of the patient were reviewed by me and considered in my medical decision making (see chart for details).  Medications Ordered in ED Medications   stroke: mapping our early stages of recovery book (has no administration in time range)  acetaminophen (TYLENOL)  tablet 650 mg (has no administration in time range)    Or  acetaminophen (TYLENOL) 160 MG/5ML solution 650 mg (has no administration in time range)    Or  acetaminophen (TYLENOL) suppository 650 mg (has no administration in time range)  enoxaparin (LOVENOX) injection 40 mg (has no administration in time range)  vitamin B-12 (CYANOCOBALAMIN) tablet 1,000 mcg (has no administration in time range)  aspirin suppository 300 mg (has no administration in time range)    Or  aspirin tablet 325 mg (has no administration in time range)  insulin aspart (novoLOG) injection 0-15 Units (has no administration in time  range)  insulin aspart (novoLOG) injection 0-5 Units (has no administration in time range)                                                                                                                                    Procedures Procedures  (including critical care time)  Medical Decision Making / ED Course I have reviewed the nursing notes for this encounter and the patient's prior records (if available in EHR or on provided paperwork).   Aydden Cumpian was evaluated in Emergency Department on 03/31/2020 for the symptoms described in the history of present illness. He was evaluated in the context of the global COVID-19 pandemic, which necessitated consideration that the patient might be at risk for infection with the SARS-CoV-2 virus that causes COVID-19. Institutional protocols and algorithms that pertain to the evaluation of patients at risk for COVID-19 are in a state of rapid change based on information released by regulatory bodies including the CDC and federal and state organizations. These policies and algorithms were followed during the patient's care in the ED.  On exam patient has subtle deficits on the right.  Given his comorbidities he is high risk for stroke. MRI ordered and revealed an acute left thalamic stroke. Neurology consulted. Patient admitted to medicine for stroke  work-up and management.      Final Clinical Impression(s) / ED Diagnoses Final diagnoses:  SOB (shortness of breath)  Arterial ischemic stroke, vertebrobasilar, thalamic, acute, left (HCC)      This chart was dictated using voice recognition software.  Despite best efforts to proofread,  errors can occur which can change the documentation meaning.   Nira Conn, MD 03/31/20 [phone]

## 2020-03-30 NOTE — Telephone Encounter (Signed)
New Message:    Wife called, she would like for Dr Jens Som to refer pt to a Neurologist right away please.  Pt have been falling, loss control of his bladder, getting strangled when he tries to swallows, fall asleep a lot.

## 2020-03-30 NOTE — ED Triage Notes (Addendum)
Pt arrives via ems from home with c/o facial droop x2 days, also has hyperglycemia with blood sugar in the 300s, also endorses generalized weakness. Took some insulin at home pta. Increased falls at home as well. 12 lead unremarkable. 200CC NS given en route. Ems stroke screen negative other than L sided facial droop. Seen at [facility] hospital yesterday and discharged without any significant findings. CT at [facility] hospital negative.

## 2020-03-31 ENCOUNTER — Emergency Department (HOSPITAL_COMMUNITY): Payer: No Typology Code available for payment source

## 2020-03-31 ENCOUNTER — Inpatient Hospital Stay (HOSPITAL_COMMUNITY): Payer: No Typology Code available for payment source

## 2020-03-31 ENCOUNTER — Other Ambulatory Visit: Payer: Self-pay

## 2020-03-31 DIAGNOSIS — I6322 Cerebral infarction due to unspecified occlusion or stenosis of basilar arteries: Secondary | ICD-10-CM

## 2020-03-31 DIAGNOSIS — R600 Localized edema: Secondary | ICD-10-CM | POA: Diagnosis present

## 2020-03-31 DIAGNOSIS — I6389 Other cerebral infarction: Secondary | ICD-10-CM

## 2020-03-31 DIAGNOSIS — E785 Hyperlipidemia, unspecified: Secondary | ICD-10-CM | POA: Diagnosis present

## 2020-03-31 DIAGNOSIS — K5900 Constipation, unspecified: Secondary | ICD-10-CM | POA: Diagnosis not present

## 2020-03-31 DIAGNOSIS — R471 Dysarthria and anarthria: Secondary | ICD-10-CM | POA: Diagnosis present

## 2020-03-31 DIAGNOSIS — N1832 Chronic kidney disease, stage 3b: Secondary | ICD-10-CM | POA: Diagnosis present

## 2020-03-31 DIAGNOSIS — R1312 Dysphagia, oropharyngeal phase: Secondary | ICD-10-CM | POA: Diagnosis present

## 2020-03-31 DIAGNOSIS — I639 Cerebral infarction, unspecified: Secondary | ICD-10-CM | POA: Diagnosis present

## 2020-03-31 DIAGNOSIS — I672 Cerebral atherosclerosis: Secondary | ICD-10-CM | POA: Diagnosis present

## 2020-03-31 DIAGNOSIS — E669 Obesity, unspecified: Secondary | ICD-10-CM | POA: Diagnosis not present

## 2020-03-31 DIAGNOSIS — E1169 Type 2 diabetes mellitus with other specified complication: Secondary | ICD-10-CM | POA: Diagnosis not present

## 2020-03-31 DIAGNOSIS — I6521 Occlusion and stenosis of right carotid artery: Secondary | ICD-10-CM | POA: Diagnosis present

## 2020-03-31 DIAGNOSIS — E1142 Type 2 diabetes mellitus with diabetic polyneuropathy: Secondary | ICD-10-CM | POA: Diagnosis present

## 2020-03-31 DIAGNOSIS — I2510 Atherosclerotic heart disease of native coronary artery without angina pectoris: Secondary | ICD-10-CM | POA: Diagnosis present

## 2020-03-31 DIAGNOSIS — E538 Deficiency of other specified B group vitamins: Secondary | ICD-10-CM | POA: Diagnosis present

## 2020-03-31 DIAGNOSIS — I350 Nonrheumatic aortic (valve) stenosis: Secondary | ICD-10-CM | POA: Diagnosis present

## 2020-03-31 DIAGNOSIS — Z89512 Acquired absence of left leg below knee: Secondary | ICD-10-CM | POA: Diagnosis not present

## 2020-03-31 DIAGNOSIS — E1122 Type 2 diabetes mellitus with diabetic chronic kidney disease: Secondary | ICD-10-CM | POA: Diagnosis present

## 2020-03-31 DIAGNOSIS — R296 Repeated falls: Secondary | ICD-10-CM | POA: Diagnosis present

## 2020-03-31 DIAGNOSIS — G8191 Hemiplegia, unspecified affecting right dominant side: Secondary | ICD-10-CM | POA: Diagnosis present

## 2020-03-31 DIAGNOSIS — I63212 Cerebral infarction due to unspecified occlusion or stenosis of left vertebral arteries: Secondary | ICD-10-CM | POA: Diagnosis not present

## 2020-03-31 DIAGNOSIS — I6381 Other cerebral infarction due to occlusion or stenosis of small artery: Secondary | ICD-10-CM | POA: Diagnosis present

## 2020-03-31 DIAGNOSIS — I10 Essential (primary) hypertension: Secondary | ICD-10-CM | POA: Diagnosis not present

## 2020-03-31 DIAGNOSIS — I130 Hypertensive heart and chronic kidney disease with heart failure and stage 1 through stage 4 chronic kidney disease, or unspecified chronic kidney disease: Secondary | ICD-10-CM | POA: Diagnosis present

## 2020-03-31 DIAGNOSIS — R29810 Facial weakness: Secondary | ICD-10-CM | POA: Diagnosis present

## 2020-03-31 DIAGNOSIS — I5032 Chronic diastolic (congestive) heart failure: Secondary | ICD-10-CM | POA: Diagnosis present

## 2020-03-31 DIAGNOSIS — R32 Unspecified urinary incontinence: Secondary | ICD-10-CM | POA: Diagnosis present

## 2020-03-31 DIAGNOSIS — R531 Weakness: Secondary | ICD-10-CM | POA: Diagnosis present

## 2020-03-31 DIAGNOSIS — E1165 Type 2 diabetes mellitus with hyperglycemia: Secondary | ICD-10-CM | POA: Diagnosis present

## 2020-03-31 DIAGNOSIS — E7800 Pure hypercholesterolemia, unspecified: Secondary | ICD-10-CM | POA: Diagnosis not present

## 2020-03-31 DIAGNOSIS — Z20822 Contact with and (suspected) exposure to covid-19: Secondary | ICD-10-CM | POA: Diagnosis present

## 2020-03-31 DIAGNOSIS — N179 Acute kidney failure, unspecified: Secondary | ICD-10-CM | POA: Diagnosis present

## 2020-03-31 LAB — GLUCOSE, CAPILLARY
Glucose-Capillary: 280 mg/dL — ABNORMAL HIGH (ref 70–99)
Glucose-Capillary: 291 mg/dL — ABNORMAL HIGH (ref 70–99)
Glucose-Capillary: 283 mg/dL — ABNORMAL HIGH (ref 70–99)
Glucose-Capillary: 290 mg/dL — ABNORMAL HIGH (ref 70–99)

## 2020-03-31 LAB — ECHOCARDIOGRAM COMPLETE
AR max vel: 1.51 cm2
AV Area VTI: 2 cm2
AV Area mean vel: 1.55 cm2
AV Mean grad: 7.7 mmHg
AV Peak grad: 13.8 mmHg
Ao pk vel: 1.86 m/s
Area-P 1/2: 2.83 cm2
S' Lateral: 3.7 cm
Single Plane A2C EF: 42.7 %

## 2020-03-31 LAB — CBG MONITORING, ED
Glucose-Capillary: 185 mg/dL — ABNORMAL HIGH (ref 70–99)
Glucose-Capillary: 200 mg/dL — ABNORMAL HIGH (ref 70–99)
Glucose-Capillary: 223 mg/dL — ABNORMAL HIGH (ref 70–99)

## 2020-03-31 LAB — RESP PANEL BY RT-PCR (FLU A&B, COVID) ARPGX2
Influenza A by PCR: NEGATIVE
Influenza B by PCR: NEGATIVE
SARS Coronavirus 2 by RT PCR: NEGATIVE

## 2020-03-31 LAB — VITAMIN B12: Vitamin B-12: 163 pg/mL — ABNORMAL LOW (ref 180–914)

## 2020-03-31 LAB — BRAIN NATRIURETIC PEPTIDE: B Natriuretic Peptide: 32.2 pg/mL (ref 0.0–100.0)

## 2020-03-31 LAB — TROPONIN I (HIGH SENSITIVITY)
Troponin I (High Sensitivity): 21 ng/L — ABNORMAL HIGH (ref <18)
Troponin I (High Sensitivity): 15 ng/L (ref <18)

## 2020-03-31 MED ORDER — CLOPIDOGREL BISULFATE 75 MG PO TABS
75.0000 mg | ORAL_TABLET | Freq: Every day | ORAL | Status: DC
  Administered 2020-03-31 – 2020-04-07 (×8): 75 mg via ORAL
  Filled 2020-03-31 (×8): qty 1

## 2020-03-31 MED ORDER — EZETIMIBE 10 MG PO TABS
10.0000 mg | ORAL_TABLET | Freq: Every day | ORAL | Status: DC
  Administered 2020-03-31 – 2020-04-06 (×7): 10 mg via ORAL
  Filled 2020-03-31 (×8): qty 1

## 2020-03-31 MED ORDER — STROKE: EARLY STAGES OF RECOVERY BOOK: Freq: Once | Status: AC

## 2020-03-31 MED ORDER — VITAMIN B-12 1000 MCG PO TABS
1000.0000 ug | ORAL_TABLET | Freq: Every day | ORAL | Status: DC
  Administered 2020-03-31 – 2020-04-07 (×8): 1000 ug via ORAL
  Filled 2020-03-31 (×8): qty 1

## 2020-03-31 MED ORDER — ACETAMINOPHEN 160 MG/5ML PO SOLN: 650.0000 mg | ORAL | Status: DC | PRN

## 2020-03-31 MED ORDER — INSULIN GLARGINE 100 UNIT/ML ~~LOC~~ SOLN: 30.0000 [IU] | Freq: Two times a day (BID) | SUBCUTANEOUS | Status: DC

## 2020-03-31 MED ORDER — ACETAMINOPHEN 650 MG RE SUPP: 650.0000 mg | RECTAL | Status: DC | PRN

## 2020-03-31 MED ORDER — ENOXAPARIN SODIUM 40 MG/0.4ML ~~LOC~~ SOLN
40.0000 mg | Freq: Every day | SUBCUTANEOUS | Status: DC
  Administered 2020-03-31 – 2020-04-07 (×8): 40 mg via SUBCUTANEOUS
  Filled 2020-03-31 (×8): qty 0.4

## 2020-03-31 MED ORDER — ASPIRIN 300 MG RE SUPP: 300.0000 mg | Freq: Every day | RECTAL | Status: DC

## 2020-03-31 MED ORDER — INSULIN ASPART 100 UNIT/ML ~~LOC~~ SOLN
0.0000 [IU] | Freq: Three times a day (TID) | SUBCUTANEOUS | Status: DC
  Administered 2020-03-31: 5 [IU] via SUBCUTANEOUS
  Administered 2020-03-31: 8 [IU] via SUBCUTANEOUS
  Administered 2020-03-31: 3 [IU] via SUBCUTANEOUS
  Administered 2020-04-01 (×2): 11 [IU] via SUBCUTANEOUS
  Administered 2020-04-01: 5 [IU] via SUBCUTANEOUS
  Administered 2020-04-02: 8 [IU] via SUBCUTANEOUS
  Administered 2020-04-02 (×2): 5 [IU] via SUBCUTANEOUS
  Administered 2020-04-03: 3 [IU] via SUBCUTANEOUS
  Administered 2020-04-03 (×2): 5 [IU] via SUBCUTANEOUS
  Administered 2020-04-04: 8 [IU] via SUBCUTANEOUS
  Administered 2020-04-04: 15 [IU] via SUBCUTANEOUS

## 2020-03-31 MED ORDER — HYDRALAZINE HCL 20 MG/ML IJ SOLN: 5.0000 mg | Freq: Four times a day (QID) | INTRAMUSCULAR | Status: DC | PRN

## 2020-03-31 MED ORDER — ASPIRIN 325 MG PO TABS
325.0000 mg | ORAL_TABLET | Freq: Every day | ORAL | Status: DC
  Administered 2020-03-31: 325 mg via ORAL
  Filled 2020-03-31: qty 1

## 2020-03-31 MED ORDER — ACETAMINOPHEN 325 MG PO TABS: 650.0000 mg | ORAL_TABLET | ORAL | Status: DC | PRN

## 2020-03-31 MED ORDER — INSULIN GLARGINE 100 UNIT/ML ~~LOC~~ SOLN
25.0000 [IU] | Freq: Two times a day (BID) | SUBCUTANEOUS | Status: DC
  Administered 2020-03-31 – 2020-04-01 (×3): 25 [IU] via SUBCUTANEOUS
  Filled 2020-03-31 (×4): qty 0.25

## 2020-03-31 MED ORDER — INSULIN ASPART 100 UNIT/ML ~~LOC~~ SOLN
0.0000 [IU] | Freq: Every day | SUBCUTANEOUS | Status: DC
  Administered 2020-03-31 – 2020-04-01 (×2): 3 [IU] via SUBCUTANEOUS
  Administered 2020-04-02 – 2020-04-03 (×2): 2 [IU] via SUBCUTANEOUS
  Administered 2020-04-04: 3 [IU] via SUBCUTANEOUS

## 2020-03-31 MED ORDER — ROSUVASTATIN CALCIUM 20 MG PO TABS
40.0000 mg | ORAL_TABLET | Freq: Every day | ORAL | Status: DC
  Administered 2020-03-31 – 2020-04-07 (×8): 40 mg via ORAL
  Filled 2020-03-31 (×8): qty 2

## 2020-03-31 MED ORDER — ASPIRIN EC 81 MG PO TBEC
81.0000 mg | DELAYED_RELEASE_TABLET | Freq: Every day | ORAL | Status: DC
  Administered 2020-04-01 – 2020-04-07 (×7): 81 mg via ORAL
  Filled 2020-03-31 (×7): qty 1

## 2020-03-31 NOTE — H&P (Addendum)
History and Physical    Jordan [name] ZOX:[id number] DOB: 10/16/1940 DOA: 03/30/2020  PCP: Jordan Qua, MD Patient coming from: Home  Chief Complaint: Facial droop,hyperglycemia  HPI: Jordan [name] is a 79 y.o. male with medical history significant of CAD, aortic stenosis, chronic diastolic CHF, insulin-dependent type 2 diabetes, hypertension, hyperlipidemia, CKD stage II-IIIb, carotid stenosis, presenting to the ED via EMS for evaluation of left-sided facial droop and hyperglycemia.   Patient took insulin at home prior to arrival. Reportedly seen at [facility] yesterday for these symptoms and discharged. Wife states for the past 3 days patient has had slurred speech, left-sided facial droop, dysphagia, multiple falls, and urinary incontinence. His blood sugars have been high for the past week. Patient denies history of prior stroke. He reports having chronic shortness of breath and cough, no recent change. Denies fevers, chest pain, nausea, vomiting, abdominal pain, diarrhea, or dysuria. He is fully vaccinated against Covid.  ED Course: Vital signs stable.  WBC 6.2, hemoglobin 12.7 (at baseline), platelet count 223K.  Sodium 138, potassium 4.1, chloride 104, bicarb 23, BUN 15, creatinine 1.4, glucose 313.  INR 1.1.  SARS-CoV-2 PCR test and influenza panel both negative.  BNP normal.  High-sensitivity troponin 21.  B12 163.  Brain MRI showing a small left gangliothalamic acute infarct.  No hemorrhage or mass-effect.  Chest x-ray showing no active cardiopulmonary disease.  Review of Systems:  All systems reviewed and apart from history of presenting illness, are negative.  Past Medical History:  Diagnosis Date  . Anemia   . Aortic stenosis   . CAD (coronary artery disease)    a. s/p IMI in past tx with POBA and cath 6 mos later with occluded RCA;  b. h/o Taxus DES to LAD and CFX;  c.  cath 1/10: LM ok, mLAD 40-50%, LAD stent ok, D2 tandem 60-70%, pCFX 30%, mAVCFX stent ok, pRCA 40%,  RV 90%, RV marginal 90%, dRCA filled L-R collats tx medically  . Carotid stenosis    dopplers 5/11: 40-59% bilat  . CHF (congestive heart failure) (HCC)   . Diabetic ulcer of left foot (HCC)   . DM2 (diabetes mellitus, type 2) (HCC)   . Heart murmur   . HLD (hyperlipidemia)   . HTN (hypertension)   . Myocardial infarction (HCC) 1995 and 2005   Heart Attack  . Osteomyelitis of left foot (HCC)   . Staphylococcus aureus bacteremia with sepsis [facility])     Past Surgical History:  Procedure Laterality Date  . ABDOMINAL AORTAGRAM N/A 04/07/2013   Procedure: ABDOMINAL [name];  Surgeon: [name], MD;  Location: [facility] CATH LAB;  Service: Cardiovascular;  Laterality: N/A;  . ABDOMINAL AORTAGRAM N/A 01/26/2014   Procedure: ABDOMINAL [name];  Surgeon: [name], MD;  Location: [facility] CATH LAB;  Service: Cardiovascular;  Laterality: N/A;  . ABDOMINAL AORTAGRAM N/A 06/13/2014   Procedure: ABDOMINAL [name];  Surgeon: [name], MD;  Location: [facility] CATH LAB;  Service: Cardiovascular;  Laterality: N/A;  . AMPUTATION Left 01/28/2014   Procedure: AMPUTATION DIGIT- LEFT 5TH TOE;  Surgeon: [name], MD;  Location: [facility] OR;  Service: Vascular;  Laterality: Left;  . AMPUTATION Left 02/02/2014   Procedure: Left 4th Ray Amputation vs Transmetatarsal Amputation;  Surgeon: [name], MD;  Location: [facility] OR;  Service: Orthopedics;  Laterality: Left;  . AMPUTATION Left 03/24/2014   Procedure: AMPUTATION BELOW KNEE;  Surgeon: [name], MD;  Location: [facility] OR;  Service: Orthopedics;  Laterality: Left;  .  APPENDECTOMY    . CARDIAC CATHETERIZATION    . CORONARY ANGIOPLASTY WITH STENT PLACEMENT    . FEMORAL-TIBIAL BYPASS GRAFT Left 01/28/2014   Procedure:  FEMORAL-ANTERIOR TIBIAL ARTERY Bypass Graft utilizing composite gortex graft and vein graft;  Surgeon: [name], MD;  Location: [facility] OR;  Service: Vascular;  Laterality: Left;  . LOWER EXTREMITY ANGIOGRAM Left  01/26/2014   Procedure: LOWER EXTREMITY ANGIOGRAM;  Surgeon: [name], MD;  Location: [facility] CATH LAB;  Service: Cardiovascular;  Laterality: Left;  . RIGHT/LEFT HEART CATH AND CORONARY ANGIOGRAPHY N/A 12/02/2017   Procedure: RIGHT/LEFT HEART CATH AND CORONARY ANGIOGRAPHY;  Surgeon: Swaziland, [name], MD;  Location: [facility] INVASIVE CV LAB;  Service: Cardiovascular;  Laterality: N/A;  . TEE WITHOUT CARDIOVERSION N/A 03/28/2014   Procedure: TRANSESOPHAGEAL ECHOCARDIOGRAM (TEE);  Surgeon: Quintella Reichert, MD;  Location: [facility] ENDOSCOPY;  Service: Cardiovascular;  Laterality: N/A;     reports that he quit smoking about 41 years ago. His smoking use included pipe. He has never used smokeless tobacco. He reports that he does not drink alcohol and does not use drugs.  Allergies  Allergen Reactions  . Cilostazol Other (See Comments)    Feels like feet on fire / Pain in feet    Family History  Problem Relation Age of Onset  . Diabetes Mother   . Cancer Mother        Pancreatic  . Heart disease Mother        After age 8  . Hypertension Mother   . Hyperlipidemia Mother   . Diabetes Sister   . Cancer Sister   . Cancer Sister        Bone  . Cancer Father     Prior to Admission medications   Medication Sig Start Date End Date Taking? Authorizing Provider  acetaminophen (TYLENOL) 325 MG tablet Take 2 tablets (650 mg total) by mouth every 6 (six) hours as needed for mild pain (or Fever >/= 101). 02/07/14   Elgergawy, Leana Roe, MD  amLODipine (NORVASC) 10 MG tablet Take 10 mg by mouth daily.    [provider]  aspirin 81 MG tablet Take 81 mg by mouth daily.    [provider]  ezetimibe (ZETIA) 10 MG tablet Take 1 tablet (10 mg total) by mouth daily. 10/13/18 01/11/19  Lewayne Bunting, MD  furosemide (LASIX) 20 MG tablet Take 40 mg by mouth daily.  05/19/15   [provider]  gabapentin (NEURONTIN) 300 MG capsule Take 300 mg by mouth 2 (two) times daily. Has taken up to 4  tabs a day    [provider]  insulin aspart (NOVOLOG) 100 UNIT/ML injection Inject 20-40 Units into the skin See admin instructions. Inject 40 units subcutaneously in the morning and 36 units at bedtime. May take additional 20 units at lunch time if blood sugar level is above 150    [provider]  insulin glargine (LANTUS) 100 UNIT/ML injection Inject 37-40 Units into the skin 2 (two) times daily. Inject 40 units subcutaneously in the morning and 37 units at night    [provider]  isosorbide mononitrate (IMDUR) 30 MG 24 hr tablet Take 30 mg by mouth daily.    [provider]  liraglutide (VICTOZA) 18 MG/3ML SOPN Inject 0.6 mg into the skin daily.  11/13/17   [provider]  losartan (COZAAR) 100 MG tablet Take 100 mg by mouth daily. 01/23/14   [provider]  metFORMIN (GLUCOPHAGE-XR) 500 MG  24 hr tablet Take 1,000 mg by mouth 2 (two) times daily. 12/19/15 12/18/16  [provider]  metFORMIN (GLUCOPHAGE-XR) 500 MG 24 hr tablet Take 2 tablets (1,000 mg total) by mouth 2 (two) times daily. 12/05/17   [place], [name], MD  metoprolol (TOPROL-XL) 100 MG 24 hr tablet Take 100 mg by mouth 2 (two) times daily.      [provider]  nitroGLYCERIN (NITROSTAT) 0.4 MG SL tablet Place 0.4 mg under the tongue every 5 (five) minutes as needed for chest pain.    [provider]  rosuvastatin (CRESTOR) 40 MG tablet Take 1 tablet (40 mg total) by mouth at bedtime. 03/29/19   Jodelle Gross, NP  Triamcinolone Acetonide (TRIAMCINOLONE 0.1 % CREAM : EUCERIN) CREA Apply 1 application topically daily.    [provider]  Vitamin D, Ergocalciferol, (DRISDOL) 1.25 MG (50000 UNIT) CAPS capsule  05/02/19   [provider]    Physical Exam: Vitals:   03/30/20 2201 03/30/20 2310 03/31/20 0103 03/31/20 0334  BP: 117/71 137/63 134/61 (!) 142/68  Pulse: 67 63 65 66  Resp: 18 17 12 14   Temp:      TempSrc:      SpO2: 96%  98% 97% 99%    Physical Exam Constitutional:      General: He is not in acute distress. HENT:     Head: Normocephalic and atraumatic.  Eyes:     Extraocular Movements: Extraocular movements intact.     Conjunctiva/sclera: Conjunctivae normal.  Cardiovascular:     Rate and Rhythm: Normal rate and regular rhythm.     Pulses: Normal pulses.  Pulmonary:     Effort: Pulmonary effort is normal. No respiratory distress.     Breath sounds: Normal breath sounds. No wheezing or rales.  Abdominal:     General: Bowel sounds are normal. There is no distension.     Palpations: Abdomen is soft.     Tenderness: There is no abdominal tenderness.  Musculoskeletal:     Cervical back: Normal range of motion and neck supple.     Comments: Status post left BKA +1 pitting edema of the right lower extremity  Skin:    General: Skin is warm and dry.  Neurological:     Mental Status: He is alert and oriented to person, place, and time.     Comments: Speech slightly dysarthric No focal weakness or sensory deficit.     Labs on Admission: I have personally reviewed following labs and imaging studies  CBC: Recent Labs  Lab 03/30/20 1528  WBC 6.2  NEUTROABS 3.2  HGB 12.7*  HCT 40.9  MCV 90.1  PLT 223   Basic Metabolic Panel: Recent Labs  Lab 03/30/20 1528  NA 138  K 4.1  CL 104  CO2 23  GLUCOSE 313*  BUN 15  CREATININE 1.43*  CALCIUM 9.2   GFR: CrCl cannot be calculated (Unknown ideal weight.). Liver Function Tests: Recent Labs  Lab 03/30/20 1528  AST 37  ALT 45*  ALKPHOS 112  BILITOT 0.4  PROT 7.5  ALBUMIN 3.5   No results for input(s): LIPASE, AMYLASE in the last 168 hours. No results for input(s): AMMONIA in the last 168 hours. Coagulation Profile: Recent Labs  Lab 03/30/20 1528  INR 1.1   Cardiac Enzymes: No results for input(s): CKTOTAL, CKMB, CKMBINDEX, TROPONINI in the last 168 hours. BNP (last 3 results) No results for input(s): PROBNP in the last 8760  hours. HbA1C: No results for input(s):  HGBA1C in the last 72 hours. CBG: Recent Labs  Lab 03/31/20 0031  GLUCAP 185*   Lipid Profile: No results for input(s): CHOL, HDL, LDLCALC, TRIG, CHOLHDL, LDLDIRECT in the last 72 hours. Thyroid Function Tests: No results for input(s): TSH, T4TOTAL, FREET4, T3FREE, THYROIDAB in the last 72 hours. Anemia Panel: Recent Labs    03/31/20 0030  VITAMINB12 163*   Urine analysis:    Component Value Date/Time   COLORURINE YELLOW 03/20/2014 1549   APPEARANCEUR CLEAR 03/20/2014 1549   LABSPEC 1.019 03/20/2014 1549   PHURINE 7.0 03/20/2014 1549   GLUCOSEU >1000 (A) 03/20/2014 1549   GLUCOSEU 250 12/19/2010 1625   HGBUR TRACE (A) 03/20/2014 1549   BILIRUBINUR NEGATIVE 03/20/2014 1549   KETONESUR NEGATIVE 03/20/2014 1549   PROTEINUR 100 (A) 03/20/2014 1549   UROBILINOGEN 0.2 03/20/2014 1549   NITRITE NEGATIVE 03/20/2014 1549   LEUKOCYTESUR NEGATIVE 03/20/2014 1549    Radiological Exams on Admission: DG Chest 2 View  Result Date: 03/31/2020 CLINICAL DATA:  Shortness of breath EXAM: CHEST - 2 VIEW COMPARISON:  03/20/2014 FINDINGS: Shallow lung inflation. The heart size and mediastinal contours are within normal limits. Both lungs are clear. The visualized skeletal structures are unremarkable. IMPRESSION: No active cardiopulmonary disease. Electronically Signed   By: Deatra Robinson M.D.   On: 03/31/2020 00:24   MR BRAIN WO CONTRAST  Result Date: 03/31/2020 CLINICAL DATA:  Left-sided facial droop EXAM: MRI HEAD WITHOUT CONTRAST TECHNIQUE: Multiplanar, multiecho pulse sequences of the brain and surrounding structures were obtained without intravenous contrast. COMPARISON:  None. FINDINGS: Brain: There is a small left gangliothalamic acute infarct. No acute or chronic hemorrhage. There is multifocal hyperintense T2-weighted signal within the white matter. Generalized volume loss without a clear lobar predilection. The midline structures are normal.  Vascular: Major flow voids are preserved. Skull and upper cervical spine: Normal calvarium and skull base. Visualized upper cervical spine and soft tissues are normal. Sinuses/Orbits:No paranasal sinus fluid levels or advanced mucosal thickening. No mastoid or middle ear effusion. Normal orbits. IMPRESSION: Small left gangliothalamic acute infarct. No hemorrhage or mass effect. Electronically Signed   By: Deatra Robinson M.D.   On: 03/31/2020 03:52    EKG: Independently reviewed.  Sinus rhythm, no significant change since prior tracing.  Assessment/Plan Principal Problem:   Stroke [facility]) Active Problems:   Hyperlipidemia   Essential hypertension   Type 2 diabetes mellitus with diabetic chronic kidney disease (HCC)   Chronic diastolic heart failure (HCC)   Acute CVA: Patient here with a 3-day history of multiple falls, slurred speech, left-sided facial droop, and dysphagia.  Brain MRI showing a small left gangliothalamic acute infarct.  No hemorrhage or mass-effect. -Neurology following, appreciate recommendations -Telemetry monitoring -Neurology not recommending permissive hypertension as last known well was 3 days ago -MRA head and neck without contrast -2D echocardiogram -Hemoglobin A1c, fasting lipid panel -Aspirin 325 p.o. now and daily -Already on a high intensity statin, resume after pharmacy med rec is done -Frequent neurochecks -PT, OT, speech therapy. -N.p.o. until cleared by bedside swallow evaluation or formal speech evaluation  Elevated troponin CAD High-sensitivity troponin 21.  EKG without acute ischemic changes.  Patient is not endorsing any anginal symptoms. -Cardiac monitoring, trend troponin.  Resume home aspirin, statin, and beta-blocker after pharmacy med rec is done.  Chronic diastolic CHF: BNP normal and no signs of pulmonary edema on chest x-ray. Has mild peripheral edema on exam. -Resume home diuretic and beta-blocker after pharmacy med rec is  done.  Insulin-dependent  type 2 diabetes with hyperglycemia: Blood glucose 313 on initial labs, most recent 185. -Check A1c.  Sliding scale insulin moderate ACHS.  Resume home basal insulin after pharmacy med rec is done.  Hypertension: Currently normotensive. -No longer in permissive hypertension window.  Resume home antihypertensives after pharmacy med rec is done.  Hyperlipidemia -Resume statin after pharmacy med rec is done.  Vitamin B12 deficiency: Also poses risk of frequent falls.  B12 level low at 163. -Start B12 supplement  CKD stage II-IIIb: Creatinine 1.4, not significantly changed from baseline.  Urinary incontinence -Check UA to rule out UTI  DVT prophylaxis: Lovenox Code Status: Patient wishes to be full code. Family Communication: Wife at bedside. Disposition Plan: Status is: Inpatient  Remains inpatient appropriate because:Inpatient level of care appropriate due to severity of illness and Needs stroke work-up   Dispo: The patient is from: Home              Anticipated d/c is to: SNF              Anticipated d/c date is: 3 days              Patient currently is not medically stable to d/c.  The medical decision making on this patient was of high complexity and the patient is at high risk for clinical deterioration, therefore this is a level 3 visit.  John GiovanniVasundhra Maverik Foot MD [facility]  If 7PM-7AM, please contact night-coverage [email or web address]  03/31/2020, 5:43 AM

## 2020-03-31 NOTE — Progress Notes (Signed)
Patient seen and examined today, admitted by Dr. Loney Loh this morning.  Briefly, 79 year old male with CAD, aortic stenosis, chronic diastolic CHF, IDDM, hypertension, hyperlipidemia, CKD stage IIIb presented with left-sided facial droop, right-sided weakness, dysphagia, multiple falls, slurred speech.  Denies any prior history of stroke.  Also reported hypoglycemia in the past week. Fully vaccinated against Covid  MRI brain showed small left ganglia thalamic acute infarct, no hemorrhage or mass-effect.  BP (!) 175/76 (BP Location: Right Arm)   Pulse 74   Temp 98.1 F (36.7 C) (Oral)   Resp 20   SpO2 100%  On exam, left-sided facial droop, left BKA, slightly dysarthric Labs, imaging reviewed  Acute CVA -Presenting with multiple falls, slurred speech, left-sided facial droop, dysarthria -MRI brain showed small left ganglial thalamic acute infarct, neurology consulted -Continue full stroke work-up, 2D echo, PT OT -Cleared bedside swallow eval, will start carb modified diet -Follow lipid panel, hemoglobin A1c -Per neurology, continue aspirin 325 mg daily, atorvastatin 80 mg daily, follow stroke team recommendations (on aspirin 81 mg daily PTA, Crestor 40 mg daily, Zetia 10 mg daily PTA)  Diabetes mellitus, type II, IDDM -Obtain hemoglobin A1c, resume Lantus, sliding scale insulin -Diabetic coronary consult  Hypertension -Permissive hypertension due to acute stroke, for now holding Toprol-XL, losartan, amlodipine, Imdur  Vitamin B12 deficiency -Placed on B12 replacement  Discussed with wife at the bedside   Synda Bagent M.D.  [facility] 03/31/2020, 11:32 AM

## 2020-03-31 NOTE — Progress Notes (Signed)
STROKE TEAM PROGRESS NOTE   INTERVAL HISTORY Wife at the bedside. Pt lying in bed, awake alert, fully orientated, still has mild dysarthria and mild right facial droop, no significant weakness in BUE and BLE, however, PT/OT still pending.   Vitals:   03/30/20 2310 03/31/20 0103 03/31/20 0334 03/31/20 0905  BP: 137/63 134/61 (!) 142/68 (!) 175/76  Pulse: 63 65 66 74  Resp: 17 12 14 20   Temp:    98.1 F (36.7 C)  TempSrc:    Oral  SpO2: 98% 97% 99% 100%   CBC:  Recent Labs  Lab 03/30/20 1528  WBC 6.2  NEUTROABS 3.2  HGB 12.7*  HCT 40.9  MCV 90.1  PLT 223   Basic Metabolic Panel:  Recent Labs  Lab 03/30/20 1528  NA 138  K 4.1  CL 104  CO2 23  GLUCOSE 313*  BUN 15  CREATININE 1.43*  CALCIUM 9.2   Lipid Panel: No results for input(s): CHOL, TRIG, HDL, CHOLHDL, VLDL, LDLCALC in the last 168 hours. HgbA1c: No results for input(s): HGBA1C in the last 168 hours. Urine Drug Screen: No results for input(s): LABOPIA, COCAINSCRNUR, LABBENZ, AMPHETMU, THCU, LABBARB in the last 168 hours.  Alcohol Level No results for input(s): ETH in the last 168 hours.  IMAGING past 24 hours DG Chest 2 View  Result Date: 03/31/2020 CLINICAL DATA:  Shortness of breath EXAM: CHEST - 2 VIEW COMPARISON:  03/20/2014 FINDINGS: Shallow lung inflation. The heart size and mediastinal contours are within normal limits. Both lungs are clear. The visualized skeletal structures are unremarkable. IMPRESSION: No active cardiopulmonary disease. Electronically Signed   By: Deatra RobinsonKevin  Herman M.D.   On: 03/31/2020 00:24   MR ANGIO HEAD WO CONTRAST  Result Date: 03/31/2020 CLINICAL DATA:  79 year old male with neurologic symptoms found to have acute on chronic small vessel infarct at the deep gray nuclei early this morning. EXAM: MRA HEAD WITHOUT CONTRAST MRA NECK WITHOUT CONTRAST TECHNIQUE: Angiographic images of the Circle of Willis were obtained using MRA technique without intravenous contrast. Angiographic images  of the neck were obtained using MRA technique without intravenous contrast. Carotid stenosis measurements (when applicable) are obtained utilizing NASCET criteria, using the distal internal carotid diameter as the denominator. COMPARISON:  Brain MRI 0312 hours today. FINDINGS: MRA NECK FINDINGS 3D time-of-flight imaging in the neck. Antegrade flow signal in both cervical carotid and vertebral arteries to the skull base. Motion degraded detail of the carotid bifurcations. No hemodynamically significant stenosis on the left. And no strong evidence of hemodynamically significant stenosis of the right ICA, although the ECA origin may be stenotic. Relatively codominant vertebral arteries in the neck. No evidence of cervical vertebral artery stenosis. MRA HEAD FINDINGS Better diagnostic quality of the intracranial MRA portion. Antegrade flow in the posterior circulation with patent distal vertebral arteries. No vertebrobasilar junction stenosis. Patent basilar artery, AICA origins, SCA and PCA origins. Posterior communicating arteries are diminutive or absent. Left PCA P1 and branches are within normal limits. There is mild to moderate right PCA P2 irregularity and stenosis. Distal PCA branches are patent. Antegrade flow in both ICA siphons. Extensive distal siphon irregularity in keeping with intracranial atherosclerosis, and high-grade right ICA stenosis is suspected in the distal petrous and proximal cavernous segment (series 5, image 45). Moderate irregularity and stenosis continues to the anterior genu. On the left there is mild to moderate irregularity and stenosis at the anterior genu. Patent carotid termini, MCA and ACA origins. Anterior communicating artery and visible ACA branches  are within normal limits. MCA M1 segments and MCA bi/trifurcations are patent without stenosis. Visible bilateral MCA branches are within normal limits. IMPRESSION: 1. 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 2. Negative for large vessel occlusion. Generally mild intracranial atherosclerosis elsewhere. 3. No evidence of hemodynamically significant ICA or vertebral artery stenosis in the neck. Electronically Signed   By: Odessa Fleming M.D.   On: 03/31/2020 09:23   MR ANGIO NECK WO CONTRAST  Result Date: 03/31/2020 CLINICAL DATA:  79 year old male with neurologic symptoms found to have acute on chronic small vessel infarct at the deep gray nuclei early this morning. EXAM: MRA HEAD WITHOUT CONTRAST MRA NECK WITHOUT CONTRAST TECHNIQUE: Angiographic images of the Circle of Willis were obtained using MRA technique without intravenous contrast. Angiographic images of the neck were obtained using MRA technique without intravenous contrast. Carotid stenosis measurements (when applicable) are obtained utilizing NASCET criteria, using the distal internal carotid diameter as the denominator. COMPARISON:  Brain MRI 0312 hours today. FINDINGS: MRA NECK FINDINGS 3D time-of-flight imaging in the neck. Antegrade flow signal in both cervical carotid and vertebral arteries to the skull base. Motion degraded detail of the carotid bifurcations. No hemodynamically significant stenosis on the left. And no strong evidence of hemodynamically significant stenosis of the right ICA, although the ECA origin may be stenotic. Relatively codominant vertebral arteries in the neck. No evidence of cervical vertebral artery stenosis. MRA HEAD FINDINGS Better diagnostic quality of the intracranial MRA portion. Antegrade flow in the posterior circulation with patent distal vertebral arteries. No vertebrobasilar junction stenosis. Patent basilar artery, AICA origins, SCA and PCA origins. Posterior communicating arteries are diminutive or absent. Left PCA P1 and branches are within normal limits. There is mild to moderate right PCA P2 irregularity and stenosis. Distal PCA branches are patent. Antegrade flow  in both ICA siphons. Extensive distal siphon irregularity in keeping with intracranial atherosclerosis, and high-grade right ICA stenosis is suspected in the distal petrous and proximal cavernous segment (series 5, image 45). Moderate irregularity and stenosis continues to the anterior genu. On the left there is mild to moderate irregularity and stenosis at the anterior genu. Patent carotid termini, MCA and ACA origins. Anterior communicating artery and visible ACA branches are within normal limits. MCA M1 segments and MCA bi/trifurcations are patent without stenosis. Visible bilateral MCA branches are within normal limits. IMPRESSION: 1. 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 2. Negative for large vessel occlusion. Generally mild intracranial atherosclerosis elsewhere. 3. No evidence of hemodynamically significant ICA or vertebral artery stenosis in the neck. Electronically Signed   By: Odessa Fleming M.D.   On: 03/31/2020 09:23   MR BRAIN WO CONTRAST  Result Date: 03/31/2020 CLINICAL DATA:  Left-sided facial droop EXAM: MRI HEAD WITHOUT CONTRAST TECHNIQUE: Multiplanar, multiecho pulse sequences of the brain and surrounding structures were obtained without intravenous contrast. COMPARISON:  None. FINDINGS: Brain: There is a small left gangliothalamic acute infarct. No acute or chronic hemorrhage. There is multifocal hyperintense T2-weighted signal within the white matter. Generalized volume loss without a clear lobar predilection. The midline structures are normal. Vascular: Major flow voids are preserved. Skull and upper cervical spine: Normal calvarium and skull base. Visualized upper cervical spine and soft tissues are normal. Sinuses/Orbits:No paranasal sinus fluid levels or advanced mucosal thickening. No mastoid or middle ear effusion. Normal orbits. IMPRESSION: Small left gangliothalamic acute infarct. No hemorrhage or mass effect.  Electronically Signed   By: Deatra Robinson M.D.   On: 03/31/2020 03:52    PHYSICAL EXAM  Temp:  [98.1 F (36.7 C)-98.6 F (37 C)] 98.1 F (36.7 C) (12/31 0905) Pulse Rate:  [63-82] 65 (12/31 1224) Resp:  [12-20] 19 (12/31 1224) BP: (104-175)/(61-90) 165/68 (12/31 1224) SpO2:  [96 %-100 %] 97 % (12/31 1224)  General - Well nourished, well developed, in no apparent distress.  Ophthalmologic - fundi not visualized due to noncooperation.  Cardiovascular - Regular rhythm and rate.  Mental Status -  Level of arousal and orientation to time, place, and person were intact. Language including expression, naming, repetition, comprehension was assessed and found intact. Mild dysarthria   Cranial Nerves II - XII - II - Visual field intact OU. III, IV, VI - Extraocular movements intact. V - Facial sensation intact bilaterally. VII - mild right nasolabial fold flattening. VIII - Hearing & vestibular intact bilaterally. X - Palate elevates symmetrically. Mild dysarthria XI - Chin turning & shoulder shrug intact bilaterally. XII - Tongue protrusion intact.  Motor Strength - The patient's strength was normal in all extremities and pronator drift was absent except left BKA.  Bulk was normal and fasciculations were absent.   Motor Tone - Muscle tone was assessed at the neck and appendages and was normal.  Reflexes - The patient's reflexes were symmetrical in all extremities and he had no pathological reflexes.  Sensory - Light touch, temperature/pinprick were assessed and were symmetrical.    Coordination - The patient had grossly normal movements in the hands with no ataxia or dysmetria.  Tremor was absent.  Gait and Station - deferred.   ASSESSMENT/PLAN [name] is a 79 y.o. 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 Sx included weakness w/ multiple falls, slurred speech, difficulty swallowing, L facial droop.   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 moderate to severe B ICA siphon atherosclerosis w/ severe R ICA petrous/cavernous stenosis   MRA neck Unremarkable   2D Echo EF 60-65%  LDL  pending   HgbA1c pending   VTE prophylaxis - Lovenox 40 mg sq daily   aspirin 81 mg daily prior to admission, now on ASA 81 and plavix 75 DAPT for 3 weeks and then plavix alone.   Therapy recommendations:  pending   Disposition:  pending   Hypertension  Stable . Permissive hypertension (OK if < 220/120) but gradually normalize in 2-3 days . Long-term BP goal normotensive  Hyperlipidemia  Home meds:  crestor 40, zetia 10  Resumed crestor and zetia  LDL  pending , goal < 70  Continue statin at discharge  Diabetes type II Uncontrolled  HgbA1c  pending, goal < 7.0  CBGs  SSI  Hyperglycemia  Close PCP follow up for better DM control  Other Stroke Risk Factors  Advanced Age >/= 15   Former Cigarette smoker, quit 41 yrs ago  Obesity, There is no height or weight on file to calculate BMI., BMI >/= 30 associated with increased stroke risk, recommend weight loss, diet and exercise as appropriate   Coronary artery disease s/p MI, DES  Chronic  diastolic congestive heart failure  Carotid stenosis   Aortic stenosis   Other Active Problems  Elevated troponin  Vitamin B 12 deficiency - on supplement  CKD stage II-IIIb, 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 Please call with questions. Pt will follow up with stroke clinic NP at [facility] in about 4 weeks. Thanks for the consult.  Marvel Plan, MD PhD Stroke  Neurology 03/31/2020 1:14 PM    To contact Stroke Continuity provider, please refer to [email or web address]. After hours, contact General Neurology

## 2020-03-31 NOTE — ED Notes (Signed)
Patient CBG was 200.

## 2020-03-31 NOTE — ED Notes (Signed)
Swallow screen done prior to medication administration

## 2020-03-31 NOTE — ED Notes (Signed)
Pt returned from MRI °

## 2020-03-31 NOTE — ED Notes (Signed)
Patient back from scan

## 2020-03-31 NOTE — Progress Notes (Signed)
  Echocardiogram 2D Echocardiogram has been performed.  Delcie Roch 03/31/2020, 10:07 AM

## 2020-03-31 NOTE — Consult Note (Signed)
Neurology Consultation  Reason for Consult: Stroke Referring Physician: Dr. Eudelia Bunch  CC: Falls, loss of bladder, difficulty walking, dysphagia, slurred speech, facial drooping  History is obtained from: Patient, chart  HPI: Jordan [name] is a 79 y.o. 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 The patient's wife reports that they returned from a funeral on 03/28/2020 she noticed that the patient was having a hard time getting out of bed due to weakness.  He had multiple falls.  He also had an episode-more than once of incontinence where he could not get to the bathroom quick enough due to this weakness.  She also noticed that his speech was getting slurred.  He was also having difficulty with swallowing.  She also noticed that he had some left-sided facial drooping. Denies any sick contacts.  Denies any prior history of strokes. The patient's cardiologist and other doctors are in the [facility], so she brought him here after having had another ER visit at [facility] yesterday where work-up was done and family was told to follow-up with outpatient primary care   LKW: Sometime on 03/28/2020 unclear time tpa given?: no, outside the window Premorbid modified Rankin scale (mRS): 2 due to left BKA  ROS: Obtained as an negative except as noted in HPI.  Past Medical History:  Diagnosis Date  . Anemia   . Aortic stenosis   . CAD (coronary artery disease)    a. s/p IMI in past tx with POBA and cath 6 mos later with occluded RCA;  b. h/o Taxus DES to LAD and CFX;  c.  cath 1/10: LM ok, mLAD 40-50%, LAD stent ok, D2 tandem 60-70%, pCFX 30%, mAVCFX stent ok, pRCA 40%, RV 90%, RV marginal 90%, dRCA filled L-R collats tx medically  . Carotid  stenosis    dopplers 5/11: 40-59% bilat  . CHF (congestive heart failure) (HCC)   . Diabetic ulcer of left foot (HCC)   . DM2 (diabetes mellitus, type 2) (HCC)   . Heart murmur   . HLD (hyperlipidemia)   . HTN (hypertension)   . Myocardial infarction (HCC) 1995 and 2005   Heart Attack  . Osteomyelitis of left foot (HCC)   . Staphylococcus aureus bacteremia with sepsis [facility])     Family History  Problem Relation Age of Onset  . Diabetes Mother   . Cancer Mother        Pancreatic  . Heart disease Mother        After age 23  . Hypertension Mother   . Hyperlipidemia Mother   . Diabetes Sister   . Cancer Sister   . Cancer Sister        Bone  . Cancer Father    Social History:   reports that he quit smoking about 41 years ago. His smoking use included pipe. He has never used smokeless tobacco. He reports that he does not drink alcohol and does not use drugs.  Medications No current facility-administered medications for this encounter.  Current Outpatient Medications:  .  acetaminophen (TYLENOL) 325 MG tablet, Take 2 tablets (650 mg total) by mouth every 6 (six) hours as needed for mild pain (or Fever >/= 101)., Disp: , Rfl:  .  amLODipine (NORVASC) 10 MG tablet, Take 10 mg by mouth daily., Disp: , Rfl:  .  aspirin 81 MG tablet, Take 81 mg by mouth daily., Disp: , Rfl:  .  ezetimibe (ZETIA) 10 MG tablet, Take 1 tablet (10 mg total) by mouth daily., Disp: 90 tablet, Rfl: 3 .  furosemide (LASIX) 20 MG tablet, Take 40 mg by mouth daily. , Disp: , Rfl:  .  gabapentin (NEURONTIN) 300 MG capsule, Take 300 mg by mouth 2 (two) times daily. Has taken up to 4 tabs a day, Disp: , Rfl:  .  insulin aspart (NOVOLOG) 100 UNIT/ML injection, Inject 20-40 Units into the skin See admin instructions. Inject 40 units subcutaneously in the morning and 36 units at bedtime. May take additional 20 units at lunch time if blood sugar level is above 150, Disp: , Rfl:  .  insulin glargine (LANTUS) 100 UNIT/ML  injection, Inject 37-40 Units into the skin 2 (two) times daily. Inject 40 units subcutaneously in the morning and 37 units at night, Disp: , Rfl:  .  isosorbide mononitrate (IMDUR) 30 MG 24 hr tablet, Take 30 mg by mouth daily., Disp: , Rfl:  .  liraglutide (VICTOZA) 18 MG/3ML SOPN, Inject 0.6 mg into the skin daily. , Disp: , Rfl:  .  losartan (COZAAR) 100 MG tablet, Take 100 mg by mouth daily., Disp: , Rfl:  .  metFORMIN (GLUCOPHAGE-XR) 500 MG 24 hr tablet, Take 1,000 mg by mouth 2 (two) times daily., Disp: , Rfl:  .  metFORMIN (GLUCOPHAGE-XR) 500 MG 24 hr tablet, Take 2 tablets (1,000 mg total) by mouth 2 (two) times daily., Disp: , Rfl:  .  metoprolol (TOPROL-XL) 100 MG 24 hr tablet, Take 100 mg by mouth 2 (two) times daily.  , Disp: , Rfl:  .  nitroGLYCERIN (NITROSTAT) 0.4 MG SL tablet, Place 0.4 mg under the tongue every 5 (five) minutes as needed for chest pain., Disp: , Rfl:  .  rosuvastatin (CRESTOR) 40 MG tablet, Take 1 tablet (40 mg total) by mouth at bedtime., Disp: 90 tablet, Rfl: 3 .  Triamcinolone Acetonide (TRIAMCINOLONE 0.1 % CREAM : EUCERIN) CREA, Apply 1 application topically daily., Disp: , Rfl:  .  Vitamin D, Ergocalciferol, (DRISDOL) 1.25 MG (50000 UNIT) CAPS capsule, , Disp: , Rfl:   Exam: Current vital signs: BP (!) 142/68   Pulse 66   Temp 98.6 F (37 C) (Oral)   Resp 14   SpO2 99%  Vital signs in last 24 hours: Temp:  [98.6 F (37 C)] 98.6 F (37 C) (12/30 1527) Pulse Rate:  [63-82] 66 (12/31 0334) Resp:  [12-18] 14 (12/31 0334) BP: (104-142)/(61-90) 142/68 (12/31 0334) SpO2:  [96 %-99 %] 99 % (12/31 0334) General: Awake alert in no distress HEENT: Normocephalic, atraumatic, dry mucous membranes CVS: Regular rate rhythm Respiratory: Clear Abdomen nondistended nontender Extremities: Left BKA with prosthesis in place. Neurological exam Awake alert oriented x3 Mild dysarthria No gross aphasia noted Cranial nerves: Pupils equal round react light,  extraocular movements intact, visual fields appear full, facial sensation intact, right nasolabial fold flattening noted on exam although the wife reported that his face looked asymmetric with left facial droop, I could not appreciate left facial droop-rather right nasolabial fold flattening was more obvious, auditory acuity intact, tongue and palate midline. Motor exam: 5/5 in all 4 extremities without drift Sensation intact to light touch without extinction Coordination: No dysmetria NIH stroke scale-2   Labs I have reviewed labs in epic and the results pertinent to this consultation are:  CBC    Component Value Date/Time   WBC  6.2 03/30/2020 1528   RBC 4.54 03/30/2020 1528   HGB 12.7 (L) 03/30/2020 1528   HGB 12.4 (L) 11/27/2017 1115   HCT 40.9 03/30/2020 1528   HCT 36.7 (L) 11/27/2017 1115   PLT 223 03/30/2020 1528   PLT 228 11/27/2017 1115   MCV 90.1 03/30/2020 1528   MCV 84 11/27/2017 1115   MCH 28.0 03/30/2020 1528   MCHC 31.1 03/30/2020 1528   RDW 15.3 03/30/2020 1528   RDW 16.0 (H) 11/27/2017 1115   LYMPHSABS 2.4 03/30/2020 1528   MONOABS 0.4 03/30/2020 1528   EOSABS 0.2 03/30/2020 1528   BASOSABS 0.1 03/30/2020 1528    CMP     Component Value Date/Time   NA 138 03/30/2020 1528   NA 138 10/12/2018 0905   K 4.1 03/30/2020 1528   CL 104 03/30/2020 1528   CO2 23 03/30/2020 1528   GLUCOSE 313 (H) 03/30/2020 1528   BUN 15 03/30/2020 1528   BUN 12 10/12/2018 0905   CREATININE 1.43 (H) 03/30/2020 1528   CREATININE 1.27 12/19/2010 1702   CREATININE 1.27 12/19/2010 1702   CALCIUM 9.2 03/30/2020 1528   PROT 7.5 03/30/2020 1528   PROT 7.1 10/12/2018 0905   ALBUMIN 3.5 03/30/2020 1528   ALBUMIN 4.1 10/12/2018 0905   AST 37 03/30/2020 1528   ALT 45 (H) 03/30/2020 1528   ALKPHOS 112 03/30/2020 1528   BILITOT 0.4 03/30/2020 1528   BILITOT 0.3 10/12/2018 0905   GFRNONAA 50 (L) 03/30/2020 1528   GFRAA 73 10/12/2018 0905    Imaging I have reviewed the images  obtained: MRI brain shows a lacunar infarct in the left basal ganglia/thalamus.  No bleed.  Assessment:  79 year old man with multiple cerebrovascular and cardiovascular risk factors presenting with complaints of weakness, difficulty walking, facial asymmetry, slurred speech and difficulty swallowing for the past 2 to 3 days. MRI brain shows an acute lacunar infarct in the left basal ganglia/thalamus. Likely etiology small vessel disease.  Recommendations: Admit to hospitalist Telemetry Frequent neurochecks His last known well was almost 3 days ago-no need for permissive hypertension. Aspirin 325 Atorvastatin 80 Renal function is somewhat deranged-for head and neck vascular imaging, I would obtain an MRI of the head and neck without contrast. 2D echo A1c Lipid panel PT OT Speech therapy N.p.o. until cleared by bedside swallow screen or formal swallow evaluation. Stroke team will follow with you Plan discussed with Dr. Eudelia Bunch.  -- Milon Dikes, MD [facility] Pager: [phone] If 7pm to 7am, please call on call as listed on AMION.

## 2020-03-31 NOTE — ED Notes (Signed)
MD at bedside. 

## 2020-03-31 NOTE — Progress Notes (Signed)
Inpatient Diabetes Program Recommendations  AACE/ADA: New Consensus Statement on Inpatient Glycemic Control (2015)  Target Ranges:  Prepandial:   less than 140 mg/dL      Peak postprandial:   less than 180 mg/dL (1-2 hours)      Critically ill patients:  140 - 180 mg/dL   Lab Results  Component Value Date   GLUCAP 223 (H) 03/31/2020   HGBA1C 8.4 (H) 03/20/2014    Review of Glycemic Control  Diabetes history: type 2 Outpatient Diabetes medications: Levemir 27 units BID, Novolog 27 units BID, Metformin 1000 mg BID Current orders for Inpatient glycemic control: Lantus 25 units BID, Novolog MODERATE correction scale TID & HS scale.  Inpatient Diabetes Program Recommendations:   Received diabetes coordinator consult. Waiting on HgbA1C. Agree with current insulin orders based on his home dosages. Will continue to monitor blood sugars while in the hospital.  Smith Mince RN BSN CDE Diabetes Coordinator Pager: [phone]  8am-5pm

## 2020-03-31 NOTE — ED Notes (Signed)
Pt going to MRI

## 2020-04-01 DIAGNOSIS — I10 Essential (primary) hypertension: Secondary | ICD-10-CM | POA: Diagnosis not present

## 2020-04-01 LAB — BASIC METABOLIC PANEL
Anion gap: 10 (ref 5–15)
BUN: 15 mg/dL (ref 8–23)
CO2: 24 mmol/L (ref 22–32)
Calcium: 9.2 mg/dL (ref 8.9–10.3)
Chloride: 100 mmol/L (ref 98–111)
Creatinine, Ser: 1.2 mg/dL (ref 0.61–1.24)
GFR, Estimated: >60 mL/min (ref >60)
Glucose, Bld: 279 mg/dL — ABNORMAL HIGH (ref 70–99)
Potassium: 4.5 mmol/L (ref 3.5–5.1)
Sodium: 134 mmol/L — ABNORMAL LOW (ref 135–145)

## 2020-04-01 LAB — LIPID PANEL
Cholesterol: 165 mg/dL (ref 0–200)
HDL: 24 mg/dL — ABNORMAL LOW (ref >40)
LDL Cholesterol: 66 mg/dL (ref 0–99)
Total CHOL/HDL Ratio: 6.9 RATIO
Triglycerides: 374 mg/dL — ABNORMAL HIGH (ref <150)
VLDL: 75 mg/dL — ABNORMAL HIGH (ref 0–40)

## 2020-04-01 LAB — URINALYSIS, ROUTINE W REFLEX MICROSCOPIC
Bacteria, UA: NONE SEEN
Bilirubin Urine: NEGATIVE
Glucose, UA: >=500 mg/dL — AB
Hgb urine dipstick: NEGATIVE
Ketones, ur: NEGATIVE mg/dL
Leukocytes,Ua: NEGATIVE
Nitrite: NEGATIVE
Protein, ur: NEGATIVE mg/dL
Specific Gravity, Urine: 1.014 (ref 1.005–1.030)
pH: 5 (ref 5.0–8.0)

## 2020-04-01 LAB — GLUCOSE, CAPILLARY
Glucose-Capillary: 250 mg/dL — ABNORMAL HIGH (ref 70–99)
Glucose-Capillary: 305 mg/dL — ABNORMAL HIGH (ref 70–99)
Glucose-Capillary: 334 mg/dL — ABNORMAL HIGH (ref 70–99)
Glucose-Capillary: 267 mg/dL — ABNORMAL HIGH (ref 70–99)

## 2020-04-01 LAB — CBC
HCT: 37.8 % — ABNORMAL LOW (ref 39.0–52.0)
Hemoglobin: 12.8 g/dL — ABNORMAL LOW (ref 13.0–17.0)
MCH: 29 pg (ref 26.0–34.0)
MCHC: 33.9 g/dL (ref 30.0–36.0)
MCV: 85.7 fL (ref 80.0–100.0)
Platelets: 235 10*3/uL (ref 150–400)
RBC: 4.41 MIL/uL (ref 4.22–5.81)
RDW: 14.9 % (ref 11.5–15.5)
WBC: 5.6 10*3/uL (ref 4.0–10.5)
nRBC: 0 % (ref 0.0–0.2)

## 2020-04-01 LAB — HEMOGLOBIN A1C
Hgb A1c MFr Bld: 8.5 % — ABNORMAL HIGH (ref 4.8–5.6)
Mean Plasma Glucose: 197.25 mg/dL

## 2020-04-01 MED ORDER — INSULIN GLARGINE 100 UNIT/ML ~~LOC~~ SOLN
27.0000 [IU] | Freq: Two times a day (BID) | SUBCUTANEOUS | Status: DC
  Administered 2020-04-01 – 2020-04-03 (×5): 27 [IU] via SUBCUTANEOUS
  Filled 2020-04-01 (×7): qty 0.27

## 2020-04-01 MED ORDER — INSULIN ASPART 100 UNIT/ML ~~LOC~~ SOLN
5.0000 [IU] | Freq: Three times a day (TID) | SUBCUTANEOUS | Status: DC
  Administered 2020-04-01 – 2020-04-02 (×3): 5 [IU] via SUBCUTANEOUS

## 2020-04-01 MED ORDER — STARCH (THICKENING) PO POWD
ORAL | Status: DC | PRN
  Filled 2020-04-01: qty 227

## 2020-04-01 MED ORDER — CHLORHEXIDINE GLUCONATE CLOTH 2 % EX PADS
6.0000 | MEDICATED_PAD | Freq: Every day | CUTANEOUS | Status: DC
  Administered 2020-04-01 – 2020-04-07 (×5): 6 via TOPICAL

## 2020-04-01 MED ORDER — RESOURCE THICKENUP CLEAR PO POWD
ORAL | Status: DC | PRN
  Filled 2020-04-01: qty 125

## 2020-04-01 NOTE — Evaluation (Signed)
Physical Therapy Evaluation Patient Details Name: Anthonio Mizzell MRN: [id number] DOB: 08-Nov-1940 Today's Date: 04/01/2020   History of Present Illness  80 y.o. 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  Clinical Impression  Pt presents to PT with deficits in functional mobility, gait, balance, strength, power, endurance, safety awareness, and sensation. Pt with generalized weakness resulting in assistance needs for all functional mobility at this time. Pt requires significant assistance to power up into standing and demonstrates poor safety awareness during transfers which place him at a high risk for falls. Pt will benefit from aggressive mobilization and PT POC to reduce falls risk and to aide in a return to a modI level of mobility. PT recommends CIR placement at the time of discharge.    Follow Up Recommendations CIR    Equipment Recommendations  None recommended by PT    Recommendations for Other Services Rehab consult     Precautions / Restrictions Precautions Precautions: Fall Restrictions Weight Bearing Restrictions: No      Mobility  Bed Mobility Overal bed mobility: Needs Assistance Bed Mobility: Supine to Sit     Supine to sit: Min assist;HOB elevated          Transfers Overall transfer level: Needs assistance Equipment used: Rolling walker (2 wheeled);1 person hand held assist Transfers: Sit to/from [employer] Sit to Stand: Mod assist Stand pivot transfers: Max assist       General transfer comment: maxA SPT without RW due to weakness  and physical assistance. maxA SPT with RW due to poor safety awareness and fatigue  Ambulation/Gait Ambulation/Gait assistance: Min assist Gait Distance (Feet): 20 Feet Assistive device: Rolling walker (2 wheeled) Gait Pattern/deviations: Step-to pattern Gait velocity: reduced Gait velocity interpretation: <1.31 ft/sec, indicative of household ambulator General Gait Details: pt with shorrt step-to gait, reduced foot clearance bilaterally, increased trunk flexion with fatigue  Stairs            Wheelchair Mobility    Modified Rankin (Stroke Patients Only) Modified Rankin (Stroke Patients Only) Pre-Morbid Rankin Score: Moderate disability Modified Rankin: Moderately severe disability     Balance Overall balance assessment: Needs assistance Sitting-balance support: No upper extremity supported;Feet supported Sitting balance-Leahy Scale: Poor Sitting balance - Comments: minG-minA without UE support Postural control: Right lateral lean Standing balance support: Bilateral upper extremity supported Standing balance-Leahy Scale: Poor Standing balance comment: minG-minA with BUE support of RW                             Pertinent Vitals/Pain Pain Assessment: No/denies pain    Home Living Family/patient expects to be discharged to:: Private residence Living Arrangements: Spouse/significant other Available Help at Discharge: Family;Available 24 hours/day Type of Home: House Home Access: Ramped entrance     Home Layout: One level Home Equipment: Cane - single point;Walker - 2 wheels;Walker - 4 wheels (electric scooter and power wheelchair ordered through [place] per spouse)      Prior Function Level of Independence: Independent with assistive device(s)         Comments: pt ambulates with use of cane vs RW, does require some  assistance with bathing     Hand Dominance   Dominant Hand: Right    Extremity/Trunk Assessment   Upper Extremity Assessment Upper  Extremity Assessment: Generalized weakness    Lower Extremity Assessment Lower Extremity Assessment: LLE deficits/detail;RLE deficits/detail;Generalized weakness RLE Sensation: history of peripheral neuropathy LLE Deficits / Details: old L BKA LLE Sensation: decreased light touch    Cervical / Trunk Assessment Cervical / Trunk Assessment: Normal  Communication   Communication: No difficulties  Cognition Arousal/Alertness: Awake/alert Behavior During Therapy: WFL for tasks assessed/performed Overall Cognitive Status: Impaired/Different from baseline Area of Impairment: Problem solving                             Problem Solving: Slow processing        General Comments General comments (skin integrity, edema, etc.): VSS on RA    Exercises     Assessment/Plan    PT Assessment Patient needs continued PT services  PT Problem List Decreased strength;Decreased activity tolerance;Decreased balance;Decreased mobility;Decreased safety awareness;Decreased knowledge of precautions;Impaired sensation       PT Treatment Interventions DME instruction;Gait training;Functional mobility training;Therapeutic activities;Therapeutic exercise;Balance training;Neuromuscular re-education;Patient/family education    PT Goals (Current goals can be found in the Care Plan section)  Acute Rehab PT Goals Patient Stated Goal: to return to prior level of function PT Goal Formulation: With patient/family Time For Goal Achievement: 04/15/20 Potential to Achieve Goals: Good    Frequency Min 4X/week   Barriers to discharge        Co-evaluation               AM-PAC PT "6 Clicks" Mobility  Outcome Measure Help needed turning from your back to your side while in a flat bed without using bedrails?: A Little Help needed moving from lying on your back to sitting on the side of a flat bed without using bedrails?: A Little Help needed moving to and from a bed to a chair (including a  wheelchair)?: A Lot Help needed standing up from a chair using your arms (e.g., wheelchair or bedside chair)?: A Lot Help needed to walk in hospital room?: A Little Help needed climbing 3-5 steps with a railing? : A Lot 6 Click Score: 15    End of Session Equipment Utilized During Treatment: Gait belt Activity Tolerance: Patient tolerated treatment well Patient left: in chair;with call bell/phone within reach;with chair alarm set Nurse Communication: Mobility status PT Visit Diagnosis: Other abnormalities of gait and mobility (R26.89);Unsteadiness on feet (R26.81);Muscle weakness (generalized) (M62.81)    Time: 2330-0762 PT Time Calculation (min) (ACUTE ONLY): 34 min   Charges:   PT Evaluation $PT Eval Moderate Complexity: 1 Mod PT Treatments $Gait Training: 8-22 mins        Arlyss Gandy, PT, DPT Acute Rehabilitation Pager: [phone]   Arlyss Gandy 04/01/2020, 10:02 AM

## 2020-04-01 NOTE — Progress Notes (Signed)
Inpatient Diabetes Program Recommendations  AACE/ADA: New Consensus Statement on Inpatient Glycemic Control (2015)  Target Ranges:  Prepandial:   less than 140 mg/dL      Peak postprandial:   less than 180 mg/dL (1-2 hours)      Critically ill patients:  140 - 180 mg/dL   Lab Results  Component Value Date   GLUCAP 250 (H) 04/01/2020   HGBA1C 8.5 (H) 04/01/2020    Review of Glycemic Control Results for [name], [name] (MRN [id number]) as of 04/01/2020 09:29  Ref. Range 03/31/2020 19:31 03/31/2020 22:15 03/31/2020 23:28 04/01/2020 06:10  Glucose-Capillary Latest Ref Range: 70 - 99 mg/dL 409 (H) 811 (H) 914 (H) 250 (H)   Diabetes history: type 2 Outpatient Diabetes medications: Levemir 27 units BID, Novolog 27 units BID, Metformin 1000 mg BID Current orders for Inpatient glycemic control: Lantus 25 units BID, Novolog MODERATE correction scale TID & HS scale.  Inpatient Diabetes Program Recommendations:   Consider increasing Levemir to 27 units BID (home dose) and adding Novolog 5 units TID (assuming pt is consuming >50% of meal).  Thanks, Lujean Rave, MSN, RNC-OB Diabetes Coordinator [phone] (8a-5p)

## 2020-04-01 NOTE — Evaluation (Signed)
Occupational Therapy Evaluation Patient Details Name: Jordan [name] MRN: [id number] DOB: [birth year] Today's Date: 04/01/2020    History of Present Illness 80 y.o. 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   Clinical Impression   PT admitted with L gangliothalamic infarct . Pt currently with functional limitiations due to the deficits listed below (see OT problem list). Pt currently with decreased activity tolerance and focused attention.  Pt will benefit from skilled OT to increase their independence and safety with adls and balance to allow discharge CIR.     Follow Up Recommendations  CIR    Equipment Recommendations  3 in 1 bedside commode    Recommendations for Other Services Rehab consult     Precautions / Restrictions Precautions Precautions: Fall Precaution Comments: L prosthetic Required Braces or Orthoses: Other Brace Restrictions Weight Bearing Restrictions: No      Mobility Bed Mobility Overal bed mobility: Needs Assistance Bed Mobility: Supine to Sit     Supine to sit: Mod assist;HOB elevated     General bed mobility comments: Pt using R bed rail to pull UB off bed surface and mod (A) to keep trunk off bed surface. pt requires mod (A) to bring R hip out to eob.    Transfers Overall transfer level: Needs assistance Equipment used: Rolling walker (2 wheeled) Transfers: Sit to/from [employer] Sit to Stand: Min assist Stand pivot transfers: Min assist       General transfer comment: Pt from bed surface and hand over hand for R UE     Balance Overall balance assessment: Needs assistance Sitting-balance support: Bilateral upper extremity supported;Feet supported Sitting balance-Leahy Scale: Poor Sitting balance - Comments: posterior lean Postural control: Right lateral lean Standing balance support: Bilateral upper extremity supported;During functional activity Standing balance-Leahy Scale: Poor Standing balance comment: minG-minA with BUE support of RW                           ADL either performed or assessed with clinical judgement   ADL Overall ADL's : Needs assistance/impaired     Grooming: Maximal assistance     Upper Body Bathing Details (indicate cue type and reason): wife completed bath on arrival max (A)         Lower Body Dressing: Maximal assistance Lower Body Dressing Details (indicate cue type and reason): pt helping rolling sleeve onto LLE. pt requires total (A) to place prosthetic Toilet Transfer: Minimal assistance;RW             General ADL Comments: pt very lethargic but following commands between arousals techniques. pt noted to have facial droop R side, speech slightly more slurred and decreased arousal. wife reports his face is more droopy. OT informed RN and in room at bedside     Vision Baseline Vision/History: Wears glasses Wears Glasses: Reading only Additional Comments: to be further assessed as pt is more awake     Perception     Praxis      Pertinent Vitals/Pain Pain Assessment: No/denies pain     Hand Dominance Right   Extremity/Trunk Assessment Upper Extremity Assessment Upper Extremity Assessment: RUE deficits/detail  RUE Deficits / Details: noted to have drift unable to sustain full AROM. pt drifting at 3 seconds. RUE Coordination: decreased fine motor   Lower Extremity Assessment Lower Extremity Assessment: Defer to PT evaluation RLE Sensation: history of peripheral neuropathy LLE Deficits / Details: L BKA LLE Sensation: decreased light  touch   Cervical / Trunk Assessment Cervical / Trunk Assessment: Normal   Communication Communication Communication: No difficulties   Cognition Arousal/Alertness: Lethargic Behavior During Therapy: Flat affect Overall Cognitive Status: Impaired/Different from baseline Area of Impairment: Problem solving                             Problem Solving: Slow processing General Comments: pt arouses to name call and following commands. pt opens eyes and says "hey darling" in response to therapist present   General Comments  VSS on RA    Exercises     Shoulder Instructions      Home Living Family/patient expects to be discharged to:: Private residence Living Arrangements: Spouse/significant other Available Help at Discharge: Family;Available 24 hours/day Type of Home: House Home Access: Ramped entrance     Home Layout: One level         Bathroom Toilet: Standard     Home Equipment: Cane - single point;Walker - 2 wheels;Walker - 4 wheels   Additional Comments: wife present and reports pt normally has additional sleeves for prosthetic but didnt grab them in a rush to leave with EMS  Lives With: Spouse    Prior Functioning/Environment Level of Independence: Independent with assistive device(s)    ADL's / Homemaking Assistance Needed: normally completes all adls indep   Comments: pt ambulates with use of cane vs RW, does require some assistance with bathing        OT Problem List: Decreased strength;Decreased activity tolerance;Impaired balance (sitting and/or standing);Decreased knowledge of use of DME or AE;Decreased safety awareness;Decreased knowledge of precautions;Obesity;Impaired UE functional use;Decreased coordination;Decreased cognition      OT Treatment/Interventions: Self-care/ADL training;Therapeutic exercise;Neuromuscular education;Energy conservation;DME and/or AE instruction;Manual therapy;Therapeutic activities;Cognitive  remediation/compensation;Patient/family education;Balance training    OT Goals(Current goals can be found in the care plan section) Acute Rehab OT Goals Patient Stated Goal: pt too tired to answer/ wife concerned over fatigue noted in pateitn with facial droop OT Goal Formulation: With family Time For Goal Achievement: 04/15/20 Potential to Achieve Goals: Good  OT Frequency: Min 2X/week   Barriers to D/C:            Co-evaluation              AM-PAC OT "6 Clicks" Daily Activity     Outcome Measure Help from another person eating meals?: A Lot Help from another person taking care of personal grooming?: A Lot Help from another person toileting, which includes using toliet, bedpan, or urinal?: A Lot Help from another person bathing (including washing, rinsing, drying)?: A Lot Help from another person to put on and taking off regular upper body clothing?: A Lot Help from another person to put on and taking off regular lower body clothing?: A Lot 6 Click Score: 12   End of Session Equipment Utilized During Treatment: Engineer, water Communication: Mobility status;Precautions  Activity Tolerance: Patient tolerated treatment well Patient left: in chair;with call bell/phone within reach;with nursing/sitter in room;with family/visitor present  OT Visit Diagnosis: Unsteadiness on feet (R26.81);Muscle weakness (generalized) (M62.81);Hemiplegia and hemiparesis Hemiplegia - Right/Left: Right Hemiplegia - dominant/non-dominant: Dominant Hemiplegia - caused by: Cerebral  infarction                Time: 1194-1740 OT Time Calculation (min): 21 min Charges:  OT General Charges $OT Visit: 1 Visit OT Evaluation $OT Eval Moderate Complexity: 1 Mod   Brynn, OTR/L  Acute Rehabilitation Services Pager: [phone] Office: [phone] .   Mateo Flow 04/01/2020, 12:26 PM

## 2020-04-01 NOTE — Plan of Care (Signed)
  Problem: SLP Dysphagia Goals Goal: Patient will utilize recommended strategies Description: Patient will utilize recommended strategies during swallow to increase swallowing safety with Flowsheets (Taken 04/01/2020 1110) Patient will utilize recommended strategies during swallow to increase swallowing safety with: min assist   Problem: SLP Cognition Goals Goal: Patient will demonstrate attention to functional Description: Patient will demonstrate attention to functional task with Flowsheets (Taken 04/01/2020 1111) Patient will demonstrate _____ attention to functional task with ____:  sustained  mod assist   Problem: SLP Language Goals Goal: Patient will utilize speech intelligibility Description: Patient will utilize speech intelligibility strategies to  enhance communication with Flowsheets (Taken 04/01/2020 1111) Patient will utilize speech intelligibility strategies to enhance communication with ____: min assist

## 2020-04-01 NOTE — Progress Notes (Signed)
PROGRESS NOTE  Jordan [name] CZY:[id number] DOB: 09-13-1940 DOA: 03/30/2020 PCP: Raelene Bott, MD  HPI/Recap of past 24 hours: HPI: Jordan [name] is a 80 y.o. male with medical history significant of CAD, aortic stenosis, chronic diastolic CHF, insulin-dependent type 2 diabetes, hypertension, hyperlipidemia, CKD stage II-IIIb, carotid stenosis, presenting to the ED via EMS for evaluation of left-sided facial droop and hyperglycemia.   Patient took insulin at home prior to arrival. Reportedly seen at [facility] yesterday for these symptoms and discharged. Wife states for the past 3 days patient has had slurred speech, left-sided facial droop, dysphagia, multiple falls, and urinary incontinence. His blood sugars have been high for the past week. Patient denies history of prior stroke. He reports having chronic shortness of breath and cough, no recent change. Denies fevers, chest pain, nausea, vomiting, abdominal pain, diarrhea, or dysuria. He is fully vaccinated against Covid.  ED Course: Vital signs stable.  WBC 6.2, hemoglobin 12.7 (at baseline), platelet count 223K.  Sodium 138, potassium 4.1, chloride 104, bicarb 23, BUN 15, creatinine 1.4, glucose 313.  INR 1.1.  SARS-CoV-2 PCR test and influenza panel both negative.  BNP normal.  High-sensitivity troponin 21.  B12 163.  Brain MRI showing a small left gangliothalamic acute infarct.  No hemorrhage or mass-effect.  Chest x-ray showing no active cardiopulmonary disease.  04/01/20: Seen and examined with his wife at his bedside.  He has mild dysarthria.  Somnolent but answers questions appropriately.  States he did not get much sleep last night.  He was seen by speech therapist with recommendation for regular consistency diet with nectar thick liquid.  PT recommended CIR.  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 HgbA1c  8.5 with goal less than 7.0. VTE prophylaxis - Lovenox 40 mg sq daily  aspirin 81 mg daily prior to admission, now on ASA 81 and plavix 75 DAPT for 3 weeks and then plavix alone.  Therapy recommendations:  CIR.  Speech therapist recommended regular consistency diet with nectar thick liquid.  Type 2 diabetes uncontrolled with hyperglycemia Hemoglobin A1c 8.5 with goal less than 7.0. Levemir dose increased to 27 units twice daily Added NovoLog 5 units 3 times daily Continue insulin coverage Avoid hypoglycemia Appreciate diabetes coordinator's assistance.  AKI, likely prerenal in the setting of poor oral intake Baseline creatinine appears to be 1.1 with GFR greater than 60 Presented with creatinine of 1.43 with GFR 50 Creatinine is downtrending Continue to avoid nephrotoxins, dehydration, hypotension Repeat renal panel in the morning Monitor urine output.  Dysphagia in the setting of acute stroke Continue to work with speech therapy Aspiration precautions  B12 deficiency Presented with B12 15 Replete  Ambulatory dysfunction post stroke PT OT recommended CIR Continue PT OT with assistance and fall precautions    Code Status: Full code  Family Communication: Wife at bedside  Disposition Plan: Likely will DC to CIR on 04/03/2020   Consultants:  Neurology, signed off on 03/31/2020  CIR  Procedures:  2D echo  Antimicrobials:  None  DVT prophylaxis: Subcu Lovenox daily  Status is: Inpatient    Dispo:  Patient From: Home  Planned Disposition: Home with Health Care Svc  Expected  discharge date: 04/03/2020  Medically stable for discharge: No, ongoing management of acute stroke.         Objective: Vitals:   04/01/20 0435 04/01/20 0812 04/01/20 1151 04/01/20 1215  BP: (!) 156/77 (!) 154/71 (!) 163/79 (!) 144/71  Pulse: 78 74 77 68  Resp: 16 18 20 16   Temp: 98 F (36.7 C) 97.7 F (36.5 C) (!) 97.5 F (36.4 C)   TempSrc: Oral Oral Oral   SpO2: 97% 98% 100% 98%    Intake/Output Summary (Last 24 hours) at 04/01/2020 1221 Last data filed at 04/01/2020 1109 Gross per 24 hour  Intake 390 ml  Output 600 ml  Net -210 ml   There were no vitals filed for this visit.  Exam:  . General: 80 y.o. year-old male well developed well nourished in no acute distress.  Alert and oriented x3.  Mild dysarthria noted. . Cardiovascular: Regular rate and rhythm with no rubs or gallops.  No thyromegaly or JVD noted.   76 Respiratory: Clear to auscultation with no wheezes or rales. Good inspiratory effort. . Abdomen: Soft nontender nondistended with normal bowel sounds x4 quadrants. . Musculoskeletal: No lower extremity edema in the right lower extremity.  Left below the knee amputation.   . Skin: No ulcerative lesions noted or rashes . Psychiatry: Mood is appropriate for condition and setting   Data Reviewed: CBC: Recent Labs  Lab 03/30/20 1528 04/01/20 0154  WBC 6.2 5.6  NEUTROABS 3.2  --   HGB 12.7* 12.8*  HCT 40.9 37.8*  MCV 90.1 85.7  PLT 223 235   Basic Metabolic Panel: Recent Labs  Lab 03/30/20 1528 04/01/20 0154  NA 138 134*  K 4.1 4.5  CL 104 100  CO2 23 24  GLUCOSE 313* 279*  BUN 15 15  CREATININE 1.43* 1.20  CALCIUM 9.2 9.2   GFR: CrCl cannot be calculated (Unknown ideal weight.). Liver Function Tests: Recent Labs  Lab 03/30/20 1528  AST 37  ALT 45*  ALKPHOS 112  BILITOT 0.4  PROT 7.5  ALBUMIN 3.5   No results for input(s): LIPASE, AMYLASE in the last 168 hours. No results for input(s): AMMONIA in the last 168 hours. Coagulation  Profile: Recent Labs  Lab 03/30/20 1528  INR 1.1   Cardiac Enzymes: No results for input(s): CKTOTAL, CKMB, CKMBINDEX, TROPONINI in the last 168 hours. BNP (last 3 results) No results for input(s): PROBNP in the last 8760 hours. HbA1C: Recent Labs    04/01/20 0154  HGBA1C 8.5*   CBG: Recent Labs  Lab 03/31/20 1931 03/31/20 2215 03/31/20 2328 04/01/20 0610 04/01/20 1150  GLUCAP 291* 283* 290* 250* 305*   Lipid Profile: Recent Labs    04/01/20 0154  CHOL 165  HDL 24*  LDLCALC 66  TRIG 05/30/20*  CHOLHDL 6.9   Thyroid Function Tests: No results for input(s): TSH, T4TOTAL, FREET4, T3FREE, THYROIDAB in the last 72 hours. Anemia Panel: Recent Labs    03/31/20 0030  VITAMINB12 163*   Urine analysis:    Component Value Date/Time   COLORURINE YELLOW 04/01/2020 0125   APPEARANCEUR CLEAR 04/01/2020 0125   LABSPEC 1.014 04/01/2020 0125   PHURINE 5.0 04/01/2020 0125   GLUCOSEU >=500 (A) 04/01/2020 0125   GLUCOSEU 250 12/19/2010 1625   HGBUR NEGATIVE 04/01/2020 0125   BILIRUBINUR NEGATIVE 04/01/2020 0125  KETONESUR NEGATIVE 04/01/2020 0125   PROTEINUR NEGATIVE 04/01/2020 0125   UROBILINOGEN 0.2 03/20/2014 1549   NITRITE NEGATIVE 04/01/2020 0125   LEUKOCYTESUR NEGATIVE 04/01/2020 0125   Sepsis Labs: @LABRCNTIP (procalcitonin:4,lacticidven:4)  ) Recent Results (from the past 240 hour(s))  Resp Panel by RT-PCR (Flu A&B, Covid) Nasopharyngeal Swab     Status: None   Collection Time: 03/31/20 12:27 AM   Specimen: Nasopharyngeal Swab; Nasopharyngeal(NP) swabs in vial transport medium  Result Value Ref Range Status   SARS Coronavirus 2 by RT PCR NEGATIVE NEGATIVE Final    Comment: (NOTE) SARS-CoV-2 target nucleic acids are NOT DETECTED.  The SARS-CoV-2 RNA is generally detectable in upper respiratory specimens during the acute phase of infection. The lowest concentration of SARS-CoV-2 viral copies this assay can detect is 138 copies/mL. A negative result does not  preclude SARS-Cov-2 infection and should not be used as the sole basis for treatment or other patient management decisions. A negative result may occur with  improper specimen collection/handling, submission of specimen other than nasopharyngeal swab, presence of viral mutation(s) within the areas targeted by this assay, and inadequate number of viral copies(<138 copies/mL). A negative result must be combined with clinical observations, patient history, and epidemiological information. The expected result is Negative.  Fact Sheet for Patients:  04/02/20  Fact Sheet for Healthcare Providers:  BloggerCourse.com  This test is no t yet approved or cleared by the [employer]ious[employer].[place] FDA and  has been authorized for detection and/or diagnosis of SARS-CoV-2 by FDA under an Emergency Use Authorization (EUA). This EUA will remain  in effect (meaning this test can be used) for the duration of the COVID-19 declaration under Section 564(b)(1) of the Act, 21 U.S.C.section 360bbb-3(b)(1), unless the authorization is terminated  or revoked sooner.       Influenza A by PCR NEGATIVE NEGATIVE Final   Influenza B by PCR NEGATIVE NEGATIVE Final    Comment: (NOTE) The Xpert Xpress SARS-CoV-2/FLU/RSV plus assay is intended as an aid in the diagnosis of influenza from Nasopharyngeal swab specimens and should not be used as a sole basis for treatment. Nasal washings and aspirates are unacceptable for Xpert Xpress SARS-CoV-2/FLU/RSV testing.  Fact Sheet for Patients: [place]  Fact Sheet for Healthcare Providers: BloggerCourse.com  This test is not yet approved or cleared by the [employer]ious[employer]ker.it FDA and has been authorized for detection and/or diagnosis of SARS-CoV-2 by FDA under an Emergency Use Authorization (EUA). This EUA will remain in effect (meaning this test can be used) for the  duration of the COVID-19 declaration under Section 564(b)(1) of the Act, 21 U.S.C. section 360bbb-3(b)(1), unless the authorization is terminated or revoked.  Performed at [facility], [address], [place], [place]       Studies: No results found.  Scheduled Meds: . aspirin EC  81 mg Oral Daily  . Chlorhexidine Gluconate Cloth  6 each Topical Daily  . clopidogrel  75 mg Oral Daily  . enoxaparin (LOVENOX) injection  40 mg Subcutaneous Daily  . ezetimibe  10 mg Oral QHS  . insulin aspart  0-15 Units Subcutaneous TID WC  . insulin aspart  0-5 Units Subcutaneous QHS  . insulin glargine  25 Units Subcutaneous BID  . rosuvastatin  40 mg Oral Daily  . vitamin B-12  1,000 mcg Oral Daily    Continuous Infusions:   LOS: 1 day     [id number], MD [facility] Pager [phone]  If 7PM-7AM, please contact night-coverage [email or web address] Password [email or web address]  04/01/2020, 12:21 PM

## 2020-04-01 NOTE — Evaluation (Signed)
Clinical/Bedside Swallow Evaluation Patient Details  Name: Jordan [name] MRN: [id number] Date of Birth: 07-Dec-1940  Today's Date: 04/01/2020 Time: SLP Start Time (ACUTE ONLY): 1011 SLP Stop Time (ACUTE ONLY): 1035 SLP Time Calculation (min) (ACUTE ONLY): 24 min  Past Medical History:  Past Medical History:  Diagnosis Date  . Anemia   . Aortic stenosis   . CAD (coronary artery disease)    a. s/p IMI in past tx with POBA and cath 6 mos later with occluded RCA;  b. h/o Taxus DES to LAD and CFX;  c.  cath 1/10: LM ok, mLAD 40-50%, LAD stent ok, D2 tandem 60-70%, pCFX 30%, mAVCFX stent ok, pRCA 40%, RV 90%, RV marginal 90%, dRCA filled L-R collats tx medically  . Carotid stenosis    dopplers 5/11: 40-59% bilat  . CHF (congestive heart failure) (HCC)   . Diabetic ulcer of left foot (HCC)   . DM2 (diabetes mellitus, type 2) (HCC)   . Heart murmur   . HLD (hyperlipidemia)   . HTN (hypertension)   . Myocardial infarction (HCC) 1995 and 2005   Heart Attack  . Osteomyelitis of left foot (HCC)   . Staphylococcus aureus bacteremia with sepsis [facility])    Past Surgical History:  Past Surgical History:  Procedure Laterality Date  . ABDOMINAL AORTAGRAM N/A 04/07/2013   Procedure: ABDOMINAL [name];  Surgeon: [name], MD;  Location: [facility] CATH LAB;  Service: Cardiovascular;  Laterality: N/A;  . ABDOMINAL AORTAGRAM N/A 01/26/2014   Procedure: ABDOMINAL [name];  Surgeon: [name], MD;  Location: [facility] CATH LAB;  Service: Cardiovascular;  Laterality: N/A;  . ABDOMINAL AORTAGRAM N/A 06/13/2014   Procedure: ABDOMINAL [name];  Surgeon: [name], MD;  Location: [facility] CATH LAB;  Service: Cardiovascular;  Laterality: N/A;  . AMPUTATION Left 01/28/2014   Procedure: AMPUTATION DIGIT- LEFT 5TH TOE;  Surgeon: [name], MD;  Location: [facility] OR;  Service: Vascular;  Laterality: Left;  . AMPUTATION Left 02/02/2014   Procedure: Left 4th Ray Amputation vs Transmetatarsal  Amputation;  Surgeon: [name], MD;  Location: [facility] OR;  Service: Orthopedics;  Laterality: Left;  . AMPUTATION Left 03/24/2014   Procedure: AMPUTATION BELOW KNEE;  Surgeon: [name], MD;  [facility] OR;  Service: Orthopedics;  Laterality: Left;  . APPENDECTOMY    . CARDIAC CATHETERIZATION    . CORONARY ANGIOPLASTY WITH STENT PLACEMENT    . FEMORAL-TIBIAL BYPASS GRAFT Left 01/28/2014   Procedure:  FEMORAL-ANTERIOR TIBIAL ARTERY Bypass Graft utilizing composite gortex graft and vein graft;  Surgeon: [name], MD;  Location: [facility] OR;  Service: Vascular;  Laterality: Left;  . LOWER EXTREMITY ANGIOGRAM Left 01/26/2014   Procedure: LOWER EXTREMITY ANGIOGRAM;  Surgeon: [name], MD;  Location: [facility] CATH LAB;  Service: Cardiovascular;  Laterality: Left;  . RIGHT/LEFT HEART CATH AND CORONARY ANGIOGRAPHY N/A 12/02/2017   Procedure: RIGHT/LEFT HEART CATH AND CORONARY ANGIOGRAPHY;  Surgeon: [place], [name], MD;  Location: [facility] INVASIVE CV LAB;  Service: Cardiovascular;  Laterality: N/A;  . TEE WITHOUT CARDIOVERSION N/A 03/28/2014   Procedure: TRANSESOPHAGEAL ECHOCARDIOGRAM (TEE);  Surgeon: Quintella Reichert, MD;  Location: [facility] ENDOSCOPY;  Service: Cardiovascular;  Laterality: N/A;   HPI:  [name] is a 80 y.o. 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 Sx included weakness w/ multiple falls, slurred speech, difficulty swallowing, L facial droop. MRI shows small left gangliothalamic infarct. BSE ordered.   Assessment / Plan / Recommendation Clinical Impression  Clinical swallow evaluation completed with Pt seated upright in chair. Pt with mild right facial  asymmetry, slow sluggish movements with oral motor tasks and Pt with reduced awareness of the same. Pt passed [place] in ED, however couging noted with breakfast. 3 oz water challenge elicited strong coughing. Pt cued to take small cup sips, which reduced coughing episodes (also coughing with straw), but did not completely eliminate. Pt with seemingly good tolerance with consumption of NTL, with occasional delayed wet vocal quality and cues to clear. Pt without difficulty masticating solids. Recommend regular textures and NTL with SLP to follow for dysphagia and consider MBSS in the next day or so pending clinical progress/improvement. Pt with reduced awareness of wet vocal quality and needs reminders to cough and swallow. Education completed with wife, who was receptive and appreciative of information. SLP will follow. SLP Visit Diagnosis: Dysphagia, unspecified (R13.10)    Aspiration Risk  Mild aspiration risk    Diet Recommendation Regular;Nectar-thick liquid   Liquid Administration via: Cup;No straw Medication Administration: Whole meds with liquid Supervision: Patient able to self feed;Intermittent supervision to cue for compensatory strategies Compensations: Slow rate;Small sips/bites;Clear throat intermittently Postural Changes: Seated upright at 90 degrees;Remain upright for at least 30 minutes after po intake    Other  Recommendations Oral Care Recommendations: Oral care BID;Staff/trained caregiver to provide oral care Other Recommendations: Order thickener from pharmacy;Clarify dietary restrictions   Follow up Recommendations Inpatient Rehab      Frequency and Duration min 2x/week  1 week       Prognosis Prognosis for Safe Diet Advancement: Good      Swallow Study   General Date of Onset: 03/30/20 HPI: [name] is a 80 y.o. 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 Sx included weakness w/ multiple falls, slurred speech, difficulty swallowing, L facial droop. MRI shows small left gangliothalamic infarct. BSE ordered. Type of Study: Bedside Swallow Evaluation Diet Prior to this Study: Regular;Thin liquids Temperature Spikes Noted: No Respiratory Status: Room air History of Recent Intubation: No Behavior/Cognition: Alert;Cooperative;Pleasant mood Oral Cavity Assessment: Within Functional Limits Oral Care Completed by SLP: No Oral Cavity - Dentition: Adequate natural dentition Vision: Functional for self-feeding Self-Feeding Abilities: Able to feed self Patient Positioning: Upright in chair Baseline Vocal Quality: Wet;Low vocal intensity Volitional Cough: Strong;Congested Volitional Swallow: Able to elicit    Oral/Motor/Sensory Function Overall Oral Motor/Sensory Function: Mild impairment Facial ROM: Reduced right Facial Symmetry: Abnormal symmetry right Facial Sensation:  (Pt reports h/o bilateral lip and tongue numbness prior to admission) Lingual ROM: Reduced right Lingual Symmetry: Within Functional Limits Lingual Strength: Reduced Lingual Sensation: Within Functional Limits Velum: Within Functional Limits Mandible: Within Functional Limits   Ice Chips Ice chips: Within functional limits Presentation: Spoon   Thin Liquid Thin Liquid: Impaired Presentation: Cup;Self Fed;Straw Pharyngeal  Phase Impairments: Suspected delayed Swallow;Multiple swallows;Cough - Immediate;Cough - Delayed    Nectar Thick Nectar Thick Liquid: Impaired Presentation: Cup;Self Fed Pharyngeal Phase Impairments: Suspected delayed Swallow;Throat Clearing - Delayed  Honey Thick Honey Thick Liquid: Not tested   Puree Puree: Within functional  limits Presentation: Spoon   Solid     Solid: Within functional limits Presentation: Self Fed     Thank you,  Havery Moros, CCC-SLP [phone]  Ashlon Lottman 04/01/2020,11:31 AM

## 2020-04-01 NOTE — Consult Note (Signed)
Physical Medicine and Rehabilitation Consult Reason for Consult: Slurred speech, multiple falls with gait abnormality Referring Physician: [facility]   HPI: Jordan [name] is a 80 y.o. right-handed male with history of CAD maintained on aspirin, aortic stenosis, chronic diastolic congestive heart failure, insulin-dependent diabetes mellitus hypertension hyperlipidemia, CKD stage III, carotid stenosis.  Per chart review lives with spouse independent with assistive device 1 level home ramped entrance.  Good family support.  Presented 03/31/2020 with gait abnormality slurred speech, left facial droop and multiple falls.  MRI showed small left gangliothalamic acute infarct no hemorrhage or mass-effect.  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  Negative for large vessel occlusion.  No evidence of hemodynamically significant ICA or vertebral artery stenosis in the neck.  Admission chemistries unremarkable except glucose 313, creatinine 1.43, hemoglobin 12.7, BNP 32, troponin 21-15.  Echocardiogram ejection fraction of 60 to 65% no wall motion abnormalities grade 1 diastolic dysfunction.  Currently maintained on aspirin and Plavix for CVA prophylaxis x3 weeks then Plavix alone.  Subcutaneous Lovenox for DVT prophylaxis.  Carb modified diet with nectar thick liquids.  Physical medicine rehab consult requested due to patient's decreased functional mobility and slurred speech.  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 He uses a cane when ambulating. His wife does assist him with donning sock and shoe on the right side. The patient also needs assist washing his back and otherwise provided set up assistance for clothing. The patient was otherwise modified independent. He has not driven for a couple years. The patient notes visual problems due to his diabetes and has some difficulty reading  although he can still read.  Review of Systems  Constitutional: Negative for chills and fever.  HENT: Negative for hearing loss.   Eyes: Negative for blurred vision and double vision.  Respiratory: Negative for cough and shortness of breath.   Cardiovascular: Negative for chest pain, palpitations and leg swelling.  Gastrointestinal: Positive for constipation. Negative for heartburn, nausea and vomiting.  Genitourinary: Negative for dysuria, flank pain and hematuria.  Musculoskeletal: Positive for falls and myalgias.  Skin: Negative for rash.  Neurological: Positive for speech change and weakness.  Psychiatric/Behavioral: Positive for depression.  All other systems reviewed and are negative.  Past Medical History:  Diagnosis Date  . Anemia   . Aortic stenosis   . CAD (coronary artery disease)    a. s/p IMI in past tx with POBA and cath 6 mos later with occluded RCA;  b. h/o Taxus DES to LAD and CFX;  c.  cath 1/10: LM ok, mLAD 40-50%, LAD stent ok, D2 tandem 60-70%, pCFX 30%, mAVCFX stent ok, pRCA 40%, RV 90%, RV marginal 90%, dRCA filled L-R collats tx medically  . Carotid stenosis    dopplers 5/11: 40-59% bilat  . CHF (congestive heart failure) (HCC)   . Diabetic ulcer of left foot (HCC)   . DM2 (diabetes mellitus, type 2) (HCC)   . Heart murmur   . HLD (hyperlipidemia)   . HTN (hypertension)   . Myocardial infarction (HCC) 1995 and 2005   Heart Attack  . Osteomyelitis of left foot (HCC)   . Staphylococcus aureus bacteremia with sepsis [facility])    Past Surgical History:  Procedure Laterality Date  . ABDOMINAL AORTAGRAM N/A 04/07/2013   Procedure: ABDOMINAL [name];  Surgeon: [name], MD;  Location: [facility] CATH LAB;  Service:  Cardiovascular;  Laterality: N/A;  . ABDOMINAL AORTAGRAM N/A 01/26/2014   Procedure: ABDOMINAL Maxcine Ham;  Surgeon: Wellington Hampshire, MD;  Location: [place] CATH LAB;  Service: Cardiovascular;  Laterality: N/A;  . ABDOMINAL AORTAGRAM N/A 06/13/2014    Procedure: ABDOMINAL Maxcine Ham;  Surgeon: Angelia Mould, MD;  Location: [facility] CATH LAB;  Service: Cardiovascular;  Laterality: N/A;  . AMPUTATION Left 01/28/2014   Procedure: AMPUTATION DIGIT- LEFT 5TH TOE;  Surgeon: Angelia Mould, MD;  Location: [place];  Service: Vascular;  Laterality: Left;  . AMPUTATION Left 02/02/2014   Procedure: Left 4th Ray Amputation vs Transmetatarsal Amputation;  Surgeon: Newt Minion, MD;  Location: [place];  Service: Orthopedics;  Laterality: Left;  . AMPUTATION Left 03/24/2014   Procedure: AMPUTATION BELOW KNEE;  Surgeon: Meredith Pel, MD;  Location: [place];  Service: Orthopedics;  Laterality: Left;  . APPENDECTOMY    . CARDIAC CATHETERIZATION    . CORONARY ANGIOPLASTY WITH STENT PLACEMENT    . FEMORAL-TIBIAL BYPASS GRAFT Left 01/28/2014   Procedure:  FEMORAL-ANTERIOR TIBIAL ARTERY Bypass Graft utilizing composite gortex graft and vein graft;  Surgeon: Angelia Mould, MD;  Location: [place];  Service: Vascular;  Laterality: Left;  . LOWER EXTREMITY ANGIOGRAM Left 01/26/2014   Procedure: LOWER EXTREMITY ANGIOGRAM;  Surgeon: Wellington Hampshire, MD;  Location: [place] CATH LAB;  Service: Cardiovascular;  Laterality: Left;  . RIGHT/LEFT HEART CATH AND CORONARY ANGIOGRAPHY N/A 12/02/2017   Procedure: RIGHT/LEFT HEART CATH AND CORONARY ANGIOGRAPHY;  Surgeon: Martinique, [name], MD;  Location: [employer] CV LAB;  Service: Cardiovascular;  Laterality: N/A;  . TEE WITHOUT CARDIOVERSION N/A 03/28/2014   Procedure: TRANSESOPHAGEAL ECHOCARDIOGRAM (TEE);  Surgeon: Sueanne Margarita, MD;  Location: [facility] ENDOSCOPY;  Service: Cardiovascular;  Laterality: N/A;   Family History  Problem Relation Age of Onset  . Diabetes Mother   . Cancer Mother        Pancreatic  . Heart disease Mother        After age 76  . Hypertension Mother   . Hyperlipidemia Mother   . Diabetes Sister   . Cancer Sister   . Cancer Sister        Bone  . Cancer Father    Social History:  reports  that he quit smoking about 41 years ago. His smoking use included pipe. He has never used smokeless tobacco. He reports that he does not drink alcohol and does not use drugs. Allergies:  Allergies  Allergen Reactions  . Cilostazol Other (See Comments)    Feels like feet on fire / Pain in feet   Medications Prior to Admission  Medication Sig Dispense Refill  . amLODipine (NORVASC) 10 MG tablet Take 10 mg by mouth at bedtime.    [name] aspirin EC 81 MG tablet Take 81 mg by mouth daily. Swallow whole.    . cyanocobalamin (,VITAMIN B-12,) 1000 MCG/ML injection Inject 1,000 mcg into the muscle every 30 (thirty) days.    [name] escitalopram (LEXAPRO) 10 MG tablet Take 10 mg by mouth daily.    [name] ezetimibe (ZETIA) 10 MG tablet Take 1 tablet (10 mg total) by mouth daily. (Patient taking differently: Take 10 mg by mouth at bedtime.) 90 tablet 3  . furosemide (LASIX) 20 MG tablet Take 40 mg by mouth daily as needed for fluid or edema.    . Ibuprofen-Acetaminophen (ADVIL DUAL ACTION) 125-250 MG TABS Take 2 tablets by mouth at bedtime as needed (pain).    . insulin aspart (NOVOLOG  FLEXPEN) 100 UNIT/ML FlexPen Inject 27 Units into the skin 2 (two) times daily.    . insulin detemir (LEVEMIR) 100 unit/ml SOLN Inject 27 Units into the skin 2 (two) times daily.    . isosorbide mononitrate (IMDUR) 30 MG 24 hr tablet Take 30 mg by mouth at bedtime.    [name] losartan (COZAAR) 100 MG tablet Take 100 mg by mouth daily.    . metFORMIN (GLUCOPHAGE) 1000 MG tablet Take 1,000 mg by mouth 2 (two) times daily with a meal.    . metoprolol (TOPROL-XL) 100 MG 24 hr tablet Take 100 mg by mouth daily.    . nitroGLYCERIN (NITROSTAT) 0.4 MG SL tablet Place 0.4 mg under the tongue every 5 (five) minutes as needed for chest pain.    . rosuvastatin (CRESTOR) 40 MG tablet Take 1 tablet (40 mg total) by mouth at bedtime. 90 tablet 3  . Vitamin D, Ergocalciferol, (DRISDOL) 1.25 MG (50000 UNIT) CAPS capsule Take 50,000 Units by mouth every 30  (thirty) days. On the 1st Monday of each month      Home: Home Living Family/patient expects to be discharged to:: Private residence Living Arrangements: Spouse/significant other Available Help at Discharge: Family,Available 24 hours/day Type of Home: House Home Access: Ramped entrance Home Layout: One level Bathroom Toilet: Standard Home Equipment: Cane - single point,Walker - 2 wheels,Walker - 4 wheels Additional Comments: wife present and reports pt normally has additional sleeves for prosthetic but didnt grab them in a rush to leave with EMS  Lives With: Spouse  Functional History: Prior Function Level of Independence: Independent with assistive device(s) ADL's / Homemaking Assistance Needed: normally completes all adls indep Comments: pt ambulates with use of cane vs RW, does require some assistance with bathing Functional Status:  Mobility: Bed Mobility Overal bed mobility: Needs Assistance Bed Mobility: Supine to Sit Supine to sit: Mod assist,HOB elevated General bed mobility comments: Pt using R bed rail to pull UB off bed surface and mod (A) to keep trunk off bed surface. pt requires mod (A) to bring R hip out to eob. Transfers Overall transfer level: Needs assistance Equipment used: Rolling walker (2 wheeled) Transfers: Sit to/from [employer] Sit to Stand: Min assist Stand pivot transfers: Min assist General transfer comment: Pt from bed surface and hand over hand for R UE Ambulation/Gait Ambulation/Gait assistance: Min assist Gait Distance (Feet): 20 Feet Assistive device: Rolling walker (2 wheeled) Gait Pattern/deviations: Step-to pattern General Gait Details: pt with shorrt step-to gait, reduced foot clearance bilaterally, increased trunk flexion with fatigue Gait velocity: reduced Gait velocity interpretation: <1.31 ft/sec, indicative of household ambulator    ADL: ADL Overall ADL's : Needs assistance/impaired Grooming: Maximal  assistance Upper Body Bathing Details (indicate cue type and reason): wife completed bath on arrival max (A) Lower Body Dressing: Maximal assistance Lower Body Dressing Details (indicate cue type and reason): pt helping rolling sleeve onto LLE. pt requires total (A) to place prosthetic Toilet Transfer: Minimal assistance,RW General ADL Comments: pt very lethargic but following commands between arousals techniques. pt noted to have facial droop R side, speech slightly more slurred and decreased arousal. wife reports his face is more droopy. OT informed RN and in room at bedside  Cognition: Cognition Overall Cognitive Status: Impaired/Different from baseline Arousal/Alertness: Awake/alert Orientation Level: Oriented to person Attention: Sustained Sustained Attention: Impaired Sustained Attention Impairment: Verbal basic Memory: Impaired Memory Impairment: Retrieval deficit,Storage deficit (1/4 delayed recall, 4/4 immediate) Awareness: Impaired Awareness Impairment: Emergent impairment (appears intact for speech,  not swallow) Problem Solving: Appears intact Executive Function: Self Monitoring Self Monitoring: Impaired Self Monitoring Impairment: Verbal basic Safety/Judgment: Appears intact Cognition Arousal/Alertness: Lethargic Behavior During Therapy: Flat affect Overall Cognitive Status: Impaired/Different from baseline Area of Impairment: Problem solving Problem Solving: Slow processing General Comments: pt arouses to name call and following commands. pt opens eyes and says "hey darling" in response to therapist present  Blood pressure (!) 163/79, pulse (!) 105, temperature 98.9 F (37.2 C), temperature source Oral, resp. rate 16, height 5\' 9"  (1.753 m), weight 104.3 kg, SpO2 97 %. Physical Exam Vitals and nursing note reviewed.  Constitutional:      Appearance: He is obese.  HENT:     Head: Normocephalic and atraumatic.     Mouth/Throat:     Mouth: Mucous membranes are dry.      Pharynx: Oropharynx is clear.  Eyes:     Extraocular Movements: Extraocular movements intact.     Conjunctiva/sclera: Conjunctivae normal.     Pupils: Pupils are equal, round, and reactive to light.  Cardiovascular:     Rate and Rhythm: Normal rate and regular rhythm.     Heart sounds: Normal heart sounds.  Pulmonary:     Effort: Pulmonary effort is normal. No respiratory distress.     Breath sounds: Normal breath sounds. No wheezing.  Abdominal:     General: Abdomen is flat. Bowel sounds are normal. There is no distension.     Palpations: Abdomen is soft. There is no mass.  Musculoskeletal:        General: No swelling or tenderness.  Skin:    General: Skin is warm and dry.  Neurological:     Mental Status: He is alert and oriented to person, place, and time.     Cranial Nerves: Dysarthria present.     Motor: No tremor or abnormal muscle tone.     Comments: Patient is alert in no acute distress.  Mild dysarthria.  Oriented x3 and follows commands.  Fair awareness of deficits.  Sensation absent at the supramalleolar area on the right side and distal. Motor strength is 4 - at the right deltoid bicep tricep finger flexors and extensors 4 at the right hip flexor knee extensor and 4 - right ankle dorsiflexor Left upper extremity 5/5 in the deltoid bicep tricep finger flexors and extensors 5/5 in the left hip flexor     Results for orders placed or performed during the hospital encounter of 03/30/20 (from the past 24 hour(s))  Glucose, capillary     Status: Abnormal   Collection Time: 04/02/20  6:43 AM  Result Value Ref Range   Glucose-Capillary 260 (H) 70 - 99 mg/dL   Comment 1 Notify RN    Comment 2 Document in Chart   Glucose, capillary     Status: Abnormal   Collection Time: 04/02/20 11:31 AM  Result Value Ref Range   Glucose-Capillary 222 (H) 70 - 99 mg/dL  Glucose, capillary     Status: Abnormal   Collection Time: 04/02/20  4:17 PM  Result Value Ref Range    Glucose-Capillary 238 (H) 70 - 99 mg/dL  Glucose, capillary     Status: Abnormal   Collection Time: 04/02/20  9:07 PM  Result Value Ref Range   Glucose-Capillary 232 (H) 70 - 99 mg/dL   No results found.  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 Does the need for close, 24 hr/day medical supervision in concert with the patient's rehab needs make it unreasonable  for this patient to be served in a less intensive setting? Yes 2. Co-Morbidities requiring supervision/potential complications: Left below-knee amputation, insulin-dependent diabetes mellitus, history of aortic stenosis, history of hypertension, chronic diastolic CHF, CKD 3 3. Due to bladder management, bowel management, safety, skin/wound care, disease management, medication administration, pain management and patient education, does the patient require 24 hr/day rehab nursing? Yes 4. Does the patient require coordinated care of a physician, rehab nurse, therapy disciplines of PT, OT, speech to address physical and functional deficits in the context of the above medical diagnosis(es)? Yes Addressing deficits in the following areas: balance, endurance, locomotion, strength, transferring, bowel/bladder control, bathing, dressing, toileting, cognition, speech and psychosocial support 5. Can the patient actively participate in an intensive therapy program of at least 3 hrs of therapy per day at least 5 days per week? Yes 6. The potential for patient to make measurable gains while on inpatient rehab is good 7. Anticipated functional outcomes upon discharge from inpatient rehab are supervision  with PT, supervision with OT, supervision with SLP. 8. Estimated rehab length of stay to reach the above functional goals is: 10 to 14 days 9. Anticipated discharge destination: Home 10. Overall Rehab/Functional Prognosis: good  RECOMMENDATIONS: This patient's condition is appropriate for continued  rehabilitative care in the following setting: CIR Patient has agreed to participate in recommended program. Yes Note that insurance prior authorization may be required for reimbursement for recommended care.  Comment:    Charlton Amor, PA-C 04/03/2020   "I have personally performed a face to face diagnostic evaluation of this patient.  Additionally, I have reviewed and concur with the physician assistant's documentation above." Erick Colace M.D. [facility] FAAPM&R (Neuromuscular Med) Diplomate Am Board of Electrodiagnostic Med Fellow Am Board of Interventional Pain

## 2020-04-01 NOTE — Evaluation (Signed)
Speech Language Pathology Evaluation Patient Details Name: Jordan [name] MRN: [id number] DOB: 09/18/1940 Today's Date: 04/01/2020 Time: 2993-7169 SLP Time Calculation (min) (ACUTE ONLY): 18 min  Problem List:  Patient Active Problem List   Diagnosis Date Noted  . Stroke ([place]) 03/31/2020  . Health care maintenance 06/26/2016  . Wound drainage-Right foot 06/02/2014  . Pressure sore-Right heel 06/02/2014  . Fissure in skin of foot-Right lateral 06/02/2014  . Diabetic ulcer of left foot ([place])   . Bacteremia   . Staphylococcus aureus bacteremia with sepsis ([place])   . Osteomyelitis of left foot ([place]) 03/24/2014  . Foot infection 03/24/2014  . Chronic diastolic heart failure ([place]) 03/23/2014  . Protein-calorie malnutrition, severe ([place]) 03/21/2014  . SIRS (systemic inflammatory response syndrome) ([place]) 03/21/2014  . Infected wound 03/20/2014  . Atherosclerosis of native arteries of the extremities with gangrene ([place]) 02/17/2014  . Diabetic ulcer of left foot associated with diabetes mellitus due to underlying condition ([place])   . Type 2 diabetes mellitus with diabetic chronic kidney disease ([place])   . Diabetic foot ulcer ([place]) 01/24/2014  . Diabetic osteomyelitis ([place]) 01/24/2014  . Acute renal failure ([place]) 01/24/2014  . CKD (chronic kidney disease), stage III ([place]) 01/24/2014  . Leukocytosis 01/24/2014  . Fever presenting with conditions classified elsewhere 01/24/2014  . Diarrhea 01/24/2014  . Gangrene ([place]) 01/24/2014  . PAD (peripheral artery disease) ([place]) 03/02/2013  . Aortic stenosis 01/09/2011  . Right flank pain 12/19/2010  . Murmur 12/19/2010  . DM (diabetes mellitus), type 2, uncontrolled, periph vascular complic ([place]) 67/89/3810  . Hyperlipidemia 02/02/2009  . Essential hypertension 02/02/2009  . MI 02/02/2009  . Coronary atherosclerosis 02/02/2009  . Cerebrovascular disease 02/02/2009  . Peripheral vascular disease ([place]) 02/02/2009  . DYSPNEA 02/02/2009  . CHEST PAIN  02/02/2009   Past Medical History:  Past Medical History:  Diagnosis Date  . Anemia   . Aortic stenosis   . CAD (coronary artery disease)    a. s/p IMI in past tx with POBA and cath 6 mos later with occluded RCA;  b. h/o Taxus DES to LAD and CFX;  c.  cath 1/10: LM ok, mLAD 40-50%, LAD stent ok, D2 tandem 60-70%, pCFX 30%, mAVCFX stent ok, pRCA 40%, RV 90%, RV marginal 90%, dRCA filled L-R collats tx medically  . Carotid stenosis    dopplers 5/11: 40-59% bilat  . CHF (congestive heart failure) ([place])   . Diabetic ulcer of left foot ([place])   . DM2 (diabetes mellitus, type 2) ([place])   . Heart murmur   . HLD (hyperlipidemia)   . HTN (hypertension)   . Myocardial infarction ([place]) 1995 and 2005   Heart Attack  . Osteomyelitis of left foot ([place])   . Staphylococcus aureus bacteremia with sepsis [facility])    Past Surgical History:  Past Surgical History:  Procedure Laterality Date  . ABDOMINAL AORTAGRAM N/A 04/07/2013   Procedure: ABDOMINAL Maxcine Ham;  Surgeon: Wellington Hampshire, MD;  Location: [place] CATH LAB;  Service: Cardiovascular;  Laterality: N/A;  . ABDOMINAL AORTAGRAM N/A 01/26/2014   Procedure: ABDOMINAL Maxcine Ham;  Surgeon: Wellington Hampshire, MD;  Location: [place] CATH LAB;  Service: Cardiovascular;  Laterality: N/A;  . ABDOMINAL AORTAGRAM N/A 06/13/2014   Procedure: ABDOMINAL Maxcine Ham;  Surgeon: Angelia Mould, MD;  Location[facility]ATH LAB;  Service: Cardiovascular;  Laterality: N/A;  . AMPUTATION Left 01/28/2014   Procedure: AMPUTATION DIGIT- LEFT 5TH TOE;  Surgeon: Angelia Mould, MD;  Location: [place];  Service: Vascular;  Laterality:  Left;  . AMPUTATION Left 02/02/2014   Procedure: Left 4th Ray Amputation vs Transmetatarsal Amputation;  Surgeon: [name], MD;  Location: [facility] OR;  Service: Orthopedics;  Laterality: Left;  . AMPUTATION Left 03/24/2014   Procedure: AMPUTATION BELOW KNEE;  Surgeon: [name], MD;  Location: [facility] OR;  Service: Orthopedics;  Laterality: Left;   . APPENDECTOMY    . CARDIAC CATHETERIZATION    . CORONARY ANGIOPLASTY WITH STENT PLACEMENT    . FEMORAL-TIBIAL BYPASS GRAFT Left 01/28/2014   Procedure:  FEMORAL-ANTERIOR TIBIAL ARTERY Bypass Graft utilizing composite gortex graft and vein graft;  Surgeon: [name], MD;  Location: [facility] OR;  Service: Vascular;  Laterality: Left;  . LOWER EXTREMITY ANGIOGRAM Left 01/26/2014   Procedure: LOWER EXTREMITY ANGIOGRAM;  Surgeon: [name], MD;  Location: [facility] CATH LAB;  Service: Cardiovascular;  Laterality: Left;  . RIGHT/LEFT HEART CATH AND CORONARY ANGIOGRAPHY N/A 12/02/2017   Procedure: RIGHT/LEFT HEART CATH AND CORONARY ANGIOGRAPHY;  Surgeon: Sw[place], [name], MD;  Location: [facility] INVASIVE CV LAB;  Service: Cardiovascular;  Laterality: N/A;  . TEE WITHOUT CARDIOVERSION N/A 03/28/2014   Procedure: TRANSESOPHAGEAL ECHOCARDIOGRAM (TEE);  Surgeon: Quintella Reichert, MD;  Location: [facility] ENDOSCOPY;  Service: Cardiovascular;  Laterality: N/A;   HPI:  [name] is a 80 y.o. 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 Sx included weakness w/ multiple falls, slurred speech, difficulty swallowing, L facial droop. MRI shows small left gangliothalamic infarct. SLE requested.   Assessment / Plan / Recommendation Clinical Impression  Cognitive linguistic evaluation completed in room with spouse present. Pt presents with mild to mild/mod cognitive linguistic deficits and mild dysarthria characterized by reduced breath support, mild dysphonia with mild wet vocal quality, imprecise articulation, impaired attention and memory negatively impacting independence with communication. His wife  indicates that he is typically soft spoken, but does acknowledge that he sounds "different". She also indicates that Pt was coughing during breakfast meal and seemed "get strangled". Will complete BSE with MD order (Pt passed [facility]) given dysarthria, wet vocal quality, and coughing with thin liquids when SLP screened in room. Recommend f/u SLP services for cognitive linguistic deficits in acute and at next venue of care (?CIR). Pt and spouse are in agreement with plan of care.    SLP Assessment  SLP Recommendation/Assessment: Patient needs continued Speech Lanaguage Pathology Services SLP Visit Diagnosis: Dysarthria and anarthria (R47.1);Cognitive communication deficit (R41.841)    Follow Up Recommendations  Inpatient Rehab    Frequency and Duration min 2x/week  1 week      SLP Evaluation Cognition  Overall Cognitive Status: Impaired/Different from baseline Arousal/Alertness: Awake/alert Orientation Level: Oriented X4 Attention: Sustained Sustained Attention: Impaired Sustained Attention Impairment: Verbal basic Memory: Impaired Memory Impairment: Retrieval deficit;Storage deficit (1/4 delayed recall, 4/4 immediate) Awareness: Impaired Awareness Impairment: Emergent impairment (appears intact for speech, not swallow) Problem Solving: Appears intact Executive Function: Self Monitoring Self Monitoring: Impaired Self Monitoring Impairment: Verbal basic Safety/Judgment: Appears intact       Comprehension  Auditory Comprehension Overall Auditory Comprehension: Appears within functional limits for tasks assessed Yes/No Questions: Within Functional Limits Commands: Within Functional Limits Conversation: Complex Interfering Components: Attention;Working Theatre manager: Within [employer] Reading Comprehension Reading Status: Not  tested    Expression Expression Primary Mode of Expression: Verbal Verbal Expression Overall Verbal  Expression: Appears within functional limits for tasks assessed Initiation: No impairment Automatic Speech: Name;Social Response;Month of year Level of Generative/Spontaneous Verbalization: Conversation Repetition: No impairment Naming: No impairment Pragmatics: No impairment Interfering Components: Attention;Speech intelligibility Non-Verbal Means of Communication: Not applicable Written Expression Dominant Hand: Right Written Expression: Not tested   Oral / Motor  Oral Motor/Sensory Function Overall Oral Motor/Sensory Function: Mild impairment Facial ROM: Reduced right Facial Symmetry: Abnormal symmetry right Facial Sensation:  (Pt reports h/o bilateral lip and tongue numbness prior to admission) Lingual ROM: Reduced right Lingual Symmetry: Within Functional Limits Lingual Strength: Reduced Lingual Sensation: Within Functional Limits Velum: Within Functional Limits Mandible: Within Functional Limits Motor Speech Overall Motor Speech: Impaired Respiration: Impaired Level of Impairment: Sentence Phonation: Low vocal intensity;Wet;Breathy Resonance: Within functional limits Articulation: Within functional limitis Intelligibility: Intelligibility reduced Word: 75-100% accurate Phrase: 75-100% accurate Sentence: 75-100% accurate Conversation: 75-100% accurate Motor Planning: Witnin functional limits Motor Speech Errors: Aware Effective Techniques: Slow rate;Increased vocal intensity;Over-articulate   Thank you,  Havery Moros, CCC-SLP [phone]                     Alexanderjames Berg 04/01/2020, 11:09 AM

## 2020-04-02 DIAGNOSIS — I10 Essential (primary) hypertension: Secondary | ICD-10-CM | POA: Diagnosis not present

## 2020-04-02 LAB — GLUCOSE, CAPILLARY
Glucose-Capillary: 260 mg/dL — ABNORMAL HIGH (ref 70–99)
Glucose-Capillary: 222 mg/dL — ABNORMAL HIGH (ref 70–99)
Glucose-Capillary: 238 mg/dL — ABNORMAL HIGH (ref 70–99)
Glucose-Capillary: 232 mg/dL — ABNORMAL HIGH (ref 70–99)

## 2020-04-02 MED ORDER — METOPROLOL SUCCINATE ER 25 MG PO TB24
12.5000 mg | ORAL_TABLET | Freq: Every day | ORAL | Status: DC
  Administered 2020-04-02 – 2020-04-03 (×2): 12.5 mg via ORAL
  Filled 2020-04-02 (×2): qty 1

## 2020-04-02 MED ORDER — INSULIN ASPART 100 UNIT/ML ~~LOC~~ SOLN
7.0000 [IU] | Freq: Three times a day (TID) | SUBCUTANEOUS | Status: DC
  Administered 2020-04-02 – 2020-04-03 (×4): 7 [IU] via SUBCUTANEOUS

## 2020-04-02 NOTE — Progress Notes (Signed)
PROGRESS NOTE  Delford Wingert XNA:[id number] DOB: 1940/09/10 DOA: 03/30/2020 PCP: [name], MD  HPI/Recap of past 24 hours: HPI: Jordan [name] is a 80 y.o. male with medical history significant of CAD, aortic stenosis, chronic diastolic CHF, insulin-dependent type 2 diabetes, hypertension, hyperlipidemia, CKD stage II-IIIb, carotid stenosis, presenting to the ED from home via EMS for evaluation of left-sided facial droop and hyperglycemia.   Patient took insulin at home prior to arrival. Reportedly seen at [facility] yesterday for these symptoms and discharged. Wife states for the past 3 days patient has had slurred speech, left-sided facial droop, dysphagia, multiple falls, and urinary incontinence. His blood sugars have been high for the past week. Patient denies history of prior stroke. He reports having chronic shortness of breath and cough, no recent change.  He is fully vaccinated against Covid.  ED Course: Vital signs stable.  WBC 6.2, hemoglobin 12.7 (at baseline), platelet count 223K.  Sodium 138, potassium 4.1, chloride 104, bicarb 23, BUN 15, creatinine 1.4, glucose 313.  INR 1.1.  SARS-CoV-2 PCR test and influenza panel both negative.  BNP normal.  High-sensitivity troponin 21.  B12 163.  Brain MRI 03/31/2020 showing a small left gangliothalamic acute infarct.  Chest x-ray showing no active cardiopulmonary disease.  Seen by PT OT with recommendation for CIR.  Speech therapist recommended regular consistency diet with nectar thick liquid.  04/02/20: Patient was seen and examined at his bedside.  He has no new complaints.  Ongoing permissive hypertension with gradual normalization of BP.  Restarted lower dose of home Toprol-XL at 12.5 mg daily.  Long-term goal BP normotensive.  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 HgbA1c  8.5 with goal less than 7.0. VTE prophylaxis - Lovenox 40 mg sq daily  aspirin 81 mg daily prior to admission, now on ASA 81 and plavix 75 DAPT for 3 weeks and then plavix alone.  Therapy recommendations:  CIR.  Speech therapist recommended regular consistency diet with nectar thick liquid.  Type 2 diabetes uncontrolled with hyperglycemia Hemoglobin A1c 8.5 with goal less than 7.0. Levemir dose increased to 27 units twice daily, continue Increase NovoLog to 7 units 3 times daily  Continue insulin sliding scale Avoid hypoglycemia Appreciate diabetes coordinator's assistance.  AKI, likely prerenal in the setting of poor oral intake Baseline creatinine appears to be 1.1 with GFR greater than 60 Presented with creatinine of 1.43 with GFR 50 Creatinine is downtrending 1.2 from 1.4. Continue to avoid nephrotoxins, dehydration, hypotension Continue to monitor urine output. Repeat renal panel in the morning  Dysphagia in the setting of acute stroke Continue to work with speech therapy Continue aspiration precautions  B12 deficiency Presented with B12 163 Continue B12 supplementation.  Ambulatory dysfunction post stroke PT OT recommended CIR Continue PT OT with assistance and fall precautions    Code Status: Full code  Family Communication: Updated his wife at bedside on 04/01/2020.  Disposition Plan: Likely will DC to CIR on 04/03/2020   Consultants:  Neurology, signed off on 03/31/2020  CIR  Procedures:  2D echo  Antimicrobials:  None  DVT prophylaxis: Subcu Lovenox daily  Status is: Inpatient    Dispo:  Patient  From: Home  Planned Disposition: Home with Health Care Svc  Expected discharge date: 04/03/2020  Medically stable for discharge: No, ongoing management of acute stroke.        Objective: Vitals:   04/01/20 2352 04/02/20 0405 04/02/20 0733 04/02/20 1135  BP: (!) 165/77 (!) 158/72 (!) 156/81 129/66  Pulse: 75 77 77 70  Resp: 16 16 18 18   Temp: 98.3 F (36.8 C) 98.2 F (36.8 C) 98.1 F (36.7 C) 97.7 F (36.5 C)  TempSrc: Oral Oral Oral Oral  SpO2: 99% 97% 100% 95%  Weight:      Height:        Intake/Output Summary (Last 24 hours) at 04/02/2020 1535 Last data filed at 04/02/2020 1100 Gross per 24 hour  Intake 60 ml  Output 1050 ml  Net -990 ml   Filed Weights   04/01/20 1300  Weight: 104.3 kg    Exam:  . General: 80 y.o. year-old male pleasant well-developed well-nourished no distress.  Alert oriented x3.  Mild dysarthria. . Cardiovascular: Regular rate and rhythm no rubs or gallops..   . Respiratory: Clear to auscultation no wheezes or rales.  .  Abdomen: Soft nontender no bowel sounds present.  . Musculoskeletal: No lower extremity edema bilaterally.   . Skin: No ulcerative lesions noted. 76 Psychiatry: Mood is appropriate for condition and setting.   Data Reviewed: CBC: Recent Labs  Lab 03/30/20 1528 04/01/20 0154  WBC 6.2 5.6  NEUTROABS 3.2  --   HGB 12.7* 12.8*  HCT 40.9 37.8*  MCV 90.1 85.7  PLT 223 235   Basic Metabolic Panel: Recent Labs  Lab 03/30/20 1528 04/01/20 0154  NA 138 134*  K 4.1 4.5  CL 104 100  CO2 23 24  GLUCOSE 313* 279*  BUN 15 15  CREATININE 1.43* 1.20  CALCIUM 9.2 9.2   GFR: Estimated Creatinine Clearance: 59.4 mL/min (by C-G formula based on SCr of 1.2 mg/dL). Liver Function Tests: Recent Labs  Lab 03/30/20 1528  AST 37  ALT 45*  ALKPHOS 112  BILITOT 0.4  PROT 7.5  ALBUMIN 3.5   No results for input(s): LIPASE, AMYLASE in the last 168 hours. No results for input(s): AMMONIA in the last 168 hours. Coagulation  Profile: Recent Labs  Lab 03/30/20 1528  INR 1.1   Cardiac Enzymes: No results for input(s): CKTOTAL, CKMB, CKMBINDEX, TROPONINI in the last 168 hours. BNP (last 3 results) No results for input(s): PROBNP in the last 8760 hours. HbA1C: Recent Labs    04/01/20 0154  HGBA1C 8.5*   CBG: Recent Labs  Lab 04/01/20 1150 04/01/20 1705 04/01/20 2139 04/02/20 0643 04/02/20 1131  GLUCAP 305* 334* 267* 260* 222*   Lipid Profile: Recent Labs    04/01/20 0154  CHOL 165  HDL 24*  LDLCALC 66  TRIG 05/30/20*  CHOLHDL 6.9   Thyroid Function Tests: No results for input(s): TSH, T4TOTAL, FREET4, T3FREE, THYROIDAB in the last 72 hours. Anemia Panel: Recent Labs    03/31/20 0030  VITAMINB12 163*   Urine analysis:    Component Value Date/Time   COLORURINE YELLOW 04/01/2020 0125   APPEARANCEUR CLEAR 04/01/2020 0125   LABSPEC 1.014 04/01/2020 0125   PHURINE 5.0 04/01/2020 0125   GLUCOSEU >=500 (A) 04/01/2020 0125   GLUCOSEU 250 12/19/2010 1625   HGBUR NEGATIVE 04/01/2020 0125  BILIRUBINUR NEGATIVE 04/01/2020 0125   KETONESUR NEGATIVE 04/01/2020 0125   PROTEINUR NEGATIVE 04/01/2020 0125   UROBILINOGEN 0.2 03/20/2014 1549   NITRITE NEGATIVE 04/01/2020 0125   LEUKOCYTESUR NEGATIVE 04/01/2020 0125   Sepsis Labs: @LABRCNTIP (procalcitonin:4,lacticidven:4)  ) Recent Results (from the past 240 hour(s))  Resp Panel by RT-PCR (Flu A&B, Covid) Nasopharyngeal Swab     Status: None   Collection Time: 03/31/20 12:27 AM   Specimen: Nasopharyngeal Swab; Nasopharyngeal(NP) swabs in vial transport medium  Result Value Ref Range Status   SARS Coronavirus 2 by RT PCR NEGATIVE NEGATIVE Final    Comment: (NOTE) SARS-CoV-2 target nucleic acids are NOT DETECTED.  The SARS-CoV-2 RNA is generally detectable in upper respiratory specimens during the acute phase of infection. The lowest concentration of SARS-CoV-2 viral copies this assay can detect is 138 copies/mL. A negative result does not  preclude SARS-Cov-2 infection and should not be used as the sole basis for treatment or other patient management decisions. A negative result may occur with  improper specimen collection/handling, submission of specimen other than nasopharyngeal swab, presence of viral mutation(s) within the areas targeted by this assay, and inadequate number of viral copies(<138 copies/mL). A negative result must be combined with clinical observations, patient history, and epidemiological information. The expected result is Negative.  Fact Sheet for Patients:  04/02/20  Fact Sheet for Healthcare Providers:  BloggerCourse.com  This test is no t yet approved or cleared by the SeriousBroker.it FDA and  has been authorized for detection and/or diagnosis of SARS-CoV-2 by FDA under an Emergency Use Authorization (EUA). This EUA will remain  in effect (meaning this test can be used) for the duration of the COVID-19 declaration under Section 564(b)(1) of the Act, 21 U.S.C.section 360bbb-3(b)(1), unless the authorization is terminated  or revoked sooner.       Influenza A by PCR NEGATIVE NEGATIVE Final   Influenza B by PCR NEGATIVE NEGATIVE Final    Comment: (NOTE) The Xpert Xpress SARS-CoV-2/FLU/RSV plus assay is intended as an aid in the diagnosis of influenza from Nasopharyngeal swab specimens and should not be used as a sole basis for treatment. Nasal washings and aspirates are unacceptable for Xpert Xpress SARS-CoV-2/FLU/RSV testing.  Fact Sheet for Patients: [place]  Fact Sheet for Healthcare Providers: BloggerCourse.com  This test is not yet approved or cleared by the SeriousBroker.it FDA and has been authorized for detection and/or diagnosis of SARS-CoV-2 by FDA under an Emergency Use Authorization (EUA). This EUA will remain in effect (meaning this test can be used) for the  duration of the COVID-19 declaration under Section 564(b)(1) of the Act, 21 U.S.C. section 360bbb-3(b)(1), unless the authorization is terminated or revoked.  Performed at [facility], [address] [place], [place]       Studies: No results found.  Scheduled Meds: . aspirin EC  81 mg Oral Daily  . Chlorhexidine Gluconate Cloth  6 each Topical Daily  . clopidogrel  75 mg Oral Daily  . enoxaparin (LOVENOX) injection  40 mg Subcutaneous Daily  . ezetimibe  10 mg Oral QHS  . insulin aspart  0-15 Units Subcutaneous TID WC  . insulin aspart  0-5 Units Subcutaneous QHS  . insulin aspart  5 Units Subcutaneous TID WC  . insulin glargine  27 Units Subcutaneous BID  . metoprolol succinate  12.5 mg Oral Daily  . rosuvastatin  40 mg Oral Daily  . vitamin B-12  1,000 mcg Oral Daily    Continuous Infusions:  LOS: 2 days     Kayleen Memos, MD [facility] Pager [phone]  If 7PM-7AM, please contact night-coverage [email or web address] Password [facility] 04/02/2020, 3:35 PM

## 2020-04-02 NOTE — Progress Notes (Signed)
Inpatient Rehab Admissions:  Inpatient Rehab Consult received.  I met with patient and wife at the bedside for rehabilitation assessment and to discuss goals and expectations of an inpatient rehab admission.  Pt acknowledged understanding.  Pt would like to review CIR brochure with wife and then make a decision regarding ongoing therapy in CIR.  Will f/u at later date.  Signed: Gayland Curry, [place], [place] Admissions Coordinator [phone]

## 2020-04-03 ENCOUNTER — Inpatient Hospital Stay (HOSPITAL_COMMUNITY): Payer: No Typology Code available for payment source

## 2020-04-03 DIAGNOSIS — I639 Cerebral infarction, unspecified: Secondary | ICD-10-CM

## 2020-04-03 DIAGNOSIS — I10 Essential (primary) hypertension: Secondary | ICD-10-CM | POA: Diagnosis not present

## 2020-04-03 LAB — GLUCOSE, CAPILLARY
Glucose-Capillary: 191 mg/dL — ABNORMAL HIGH (ref 70–99)
Glucose-Capillary: 233 mg/dL — ABNORMAL HIGH (ref 70–99)
Glucose-Capillary: 234 mg/dL — ABNORMAL HIGH (ref 70–99)
Glucose-Capillary: 201 mg/dL — ABNORMAL HIGH (ref 70–99)

## 2020-04-03 MED ORDER — SENNOSIDES-DOCUSATE SODIUM 8.6-50 MG PO TABS
2.0000 | ORAL_TABLET | Freq: Two times a day (BID) | ORAL | Status: DC
  Administered 2020-04-03 – 2020-04-07 (×9): 2 via ORAL
  Filled 2020-04-03 (×10): qty 2

## 2020-04-03 MED ORDER — POLYETHYLENE GLYCOL 3350 17 G PO PACK
17.0000 g | PACK | Freq: Every day | ORAL | Status: DC | PRN
  Administered 2020-04-03: 17 g via ORAL
  Filled 2020-04-03: qty 1

## 2020-04-03 MED ORDER — METOPROLOL SUCCINATE ER 25 MG PO TB24
12.5000 mg | ORAL_TABLET | Freq: Once | ORAL | Status: AC
  Administered 2020-04-03: 12.5 mg via ORAL
  Filled 2020-04-03: qty 1

## 2020-04-03 MED ORDER — METOPROLOL SUCCINATE ER 25 MG PO TB24
25.0000 mg | ORAL_TABLET | Freq: Every day | ORAL | Status: DC
  Administered 2020-04-04 – 2020-04-07 (×4): 25 mg via ORAL
  Filled 2020-04-03 (×4): qty 1

## 2020-04-03 MED ORDER — BISACODYL 10 MG RE SUPP
10.0000 mg | Freq: Once | RECTAL | Status: AC
  Administered 2020-04-03: 10 mg via RECTAL
  Filled 2020-04-03: qty 1

## 2020-04-03 NOTE — H&P (Signed)
Physical Medicine and Rehabilitation Admission H&P    Chief Complaint  Patient presents with  . Facial Droop  . 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  Per chart review lives with spouse independent with assistive device.  His wife does assist with some basic ADLs.  He has not driven for a couple of years.  1 level home ramped entrance.  Good family support.  Presented 03/31/2020 with gait abnormality slurred speech left facial droop multiple falls.  MRI showed small left gangliothalamic acute infarction no hemorrhage or mass-effect.  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  Negative for large vessel occlusion.  No evidence of hemodynamically significant ICA or vertebral artery stenosis in the neck.  Admission chemistries unremarkable except glucose 313 creatinine 1.43 hemoglobin 12.7 BNP 32 troponin 21-15.  Echocardiogram with ejection fraction of 60 to 65% no wall motion abnormalities grade 1 diastolic dysfunction.  Maintained on aspirin and Plavix x3 weeks then Plavix alone for CVA prophylaxis.  Subcutaneous Lovenox for DVT prophylaxis.  Carb modified diet with nectar liquids.  Physical medicine rehabilitation consulted due to patient's decrease in functional ability and slurred speech and was admitted for a comprehensive rehab program.  Review of Systems  Constitutional: Negative for chills and fever.  HENT: Negative for hearing loss.   Eyes: Negative for blurred vision and double vision.  Respiratory: Negative for cough and shortness of breath.   Cardiovascular: Positive for leg swelling. Negative for chest pain and palpitations.   Gastrointestinal: Positive for constipation. Negative for heartburn, nausea and vomiting.  Genitourinary: Negative for dysuria, flank pain and hematuria.  Musculoskeletal: Positive for falls and myalgias.  Skin: Negative for rash.  Neurological: Positive for speech change and weakness.  Psychiatric/Behavioral: Positive for depression.  All other systems reviewed and are negative.  Past Medical History:  Diagnosis Date  . Anemia   . Aortic stenosis   . CAD (coronary artery disease)    a. s/p IMI in past tx with POBA and cath 6 mos later with occluded RCA;  b. h/o Taxus DES to LAD and CFX;  c.  cath 1/10: LM ok, mLAD 40-50%, LAD stent ok, D2 tandem 60-70%, pCFX 30%, mAVCFX stent ok, pRCA 40%, RV 90%, RV marginal 90%, dRCA filled L-R collats tx medically  . Carotid stenosis    dopplers 5/11: 40-59% bilat  . CHF (congestive heart failure) (HCC)   . Diabetic ulcer of left foot (HCC)   . DM2 (diabetes mellitus, type 2) (HCC)   . Heart murmur   . HLD (hyperlipidemia)   . HTN (hypertension)   . Myocardial infarction (HCC) 1995 and 2005   Heart Attack  . Osteomyelitis of left foot (HCC)   . Staphylococcus aureus bacteremia with sepsis [facility])    Past Surgical History:  Procedure Laterality Date  . ABDOMINAL AORTAGRAM N/A 04/07/2013   Procedure: ABDOMINAL [name];  Surgeon: [name], MD;  Location: [facility] CATH LAB;  Service: Cardiovascular;  Laterality: N/A;  . ABDOMINAL AORTAGRAM N/A 01/26/2014   Procedure: ABDOMINAL [name];  Surgeon: [name], MD;  Location: [facility] CATH LAB;  Service: Cardiovascular;  Laterality: N/A;  . ABDOMINAL AORTAGRAM N/A 06/13/2014   Procedure: ABDOMINAL [name];  Surgeon: [name],  MD;  Location: [facility] CATH LAB;  Service: Cardiovascular;  Laterality: N/A;  . AMPUTATION Left 01/28/2014   Procedure: AMPUTATION DIGIT- LEFT 5TH TOE;  Surgeon: [name], MD;  Location: [facility] OR;  Service: Vascular;  Laterality: Left;  . AMPUTATION Left  02/02/2014   Procedure: Left 4th Ray Amputation vs Transmetatarsal Amputation;  Surgeon: [name], MD;  Location: [facility] OR;  Service: Orthopedics;  Laterality: Left;  . AMPUTATION Left 03/24/2014   Procedure: AMPUTATION BELOW KNEE;  Surgeon: [name], MD;  Location: [facility] OR;  Service: Orthopedics;  Laterality: Left;  . APPENDECTOMY    . CARDIAC CATHETERIZATION    . CORONARY ANGIOPLASTY WITH STENT PLACEMENT    . FEMORAL-TIBIAL BYPASS GRAFT Left 01/28/2014   Procedure:  FEMORAL-ANTERIOR TIBIAL ARTERY Bypass Graft utilizing composite gortex graft and vein graft;  Surgeon: [name], MD;  Location: [facility] OR;  Service: Vascular;  Laterality: Left;  . LOWER EXTREMITY ANGIOGRAM Left 01/26/2014   Procedure: LOWER EXTREMITY ANGIOGRAM;  Surgeon: [name], MD;  Location: [facility] CATH LAB;  Service: Cardiovascular;  Laterality: Left;  . RIGHT/LEFT HEART CATH AND CORONARY ANGIOGRAPHY N/A 12/02/2017   Procedure: RIGHT/LEFT HEART CATH AND CORONARY ANGIOGRAPHY;  Surgeon: [place], [name], MD;  Location: [facility] INVASIVE CV LAB;  Service: Cardiovascular;  Laterality: N/A;  . TEE WITHOUT CARDIOVERSION N/A 03/28/2014   Procedure: TRANSESOPHAGEAL ECHOCARDIOGRAM (TEE);  Surgeon: Quintella Reichert, MD;  Location: [facility] ENDOSCOPY;  Service: Cardiovascular;  Laterality: N/A;   Family History  Problem Relation Age of Onset  . Diabetes Mother   . Cancer Mother        Pancreatic  . Heart disease Mother        After age 18  . Hypertension Mother   . Hyperlipidemia Mother   . Diabetes Sister   . Cancer Sister   . Cancer Sister        Bone  . Cancer Father    Social History:  reports that he quit smoking about 41 years ago. His smoking use included pipe. He has never used smokeless tobacco. He reports that he does not drink alcohol and does not use drugs. Allergies:  Allergies  Allergen Reactions  . Cilostazol Other (See Comments)    Feels like feet on fire / Pain in feet   Medications Prior to  Admission  Medication Sig Dispense Refill  . amLODipine (NORVASC) 10 MG tablet Take 10 mg by mouth at bedtime.    [name] aspirin EC 81 MG tablet Take 81 mg by mouth daily. Swallow whole.    . cyanocobalamin (,VITAMIN B-12,) 1000 MCG/ML injection Inject 1,000 mcg into the muscle every 30 (thirty) days.    [name] escitalopram (LEXAPRO) 10 MG tablet Take 10 mg by mouth daily.    [name] ezetimibe (ZETIA) 10 MG tablet Take 1 tablet (10 mg total) by mouth daily. (Patient taking differently: Take 10 mg by mouth at bedtime.) 90 tablet 3  . furosemide (LASIX) 20 MG tablet Take 40 mg by mouth daily as needed for fluid or edema.    . Ibuprofen-Acetaminophen (ADVIL DUAL ACTION) 125-250 MG TABS Take 2 tablets by mouth at bedtime as needed (pain).    . insulin aspart (NOVOLOG FLEXPEN) 100 UNIT/ML FlexPen Inject 27 Units into the skin 2 (two) times daily.    . insulin detemir (LEVEMIR) 100 unit/ml SOLN Inject 27 Units into the skin 2 (two) times daily.    . isosorbide mononitrate (IMDUR) 30 MG 24 hr tablet Take 30  mg by mouth at bedtime.    [name] losartan (COZAAR) 100 MG tablet Take 100 mg by mouth daily.    . metFORMIN (GLUCOPHAGE) 1000 MG tablet Take 1,000 mg by mouth 2 (two) times daily with a meal.    . metoprolol (TOPROL-XL) 100 MG 24 hr tablet Take 100 mg by mouth daily.    . nitroGLYCERIN (NITROSTAT) 0.4 MG SL tablet Place 0.4 mg under the tongue every 5 (five) minutes as needed for chest pain.    . rosuvastatin (CRESTOR) 40 MG tablet Take 1 tablet (40 mg total) by mouth at bedtime. 90 tablet 3  . Vitamin D, Ergocalciferol, (DRISDOL) 1.25 MG (50000 UNIT) CAPS capsule Take 50,000 Units by mouth every 30 (thirty) days. On the 1st Monday of each month      Drug Regimen Review Drug regimen was reviewed and remains appropriate with no significant issues identified  Home: Home Living Family/patient expects to be discharged to:: Private residence Living Arrangements: Spouse/significant other Available Help at Discharge:  Family,Available 24 hours/day Type of Home: House Home Access: Ramped entrance Home Layout: One level Bathroom Shower/Tub: Health visitor: Handicapped height Bathroom Accessibility: Yes Home Equipment: Cane - single point,Walker - 2 wheels,Walker - 4 wheels Additional Comments: wife present and reports pt normally has additional sleeves for prosthetic but didnt grab them in a rush to leave with EMS  Lives With: Spouse   Functional History: Prior Function Level of Independence: Independent with assistive device(s) ADL's / Homemaking Assistance Needed: normally completes all adls indep Comments: pt ambulates with use of cane vs RW, does require some assistance with bathing  Functional Status:  Mobility: Bed Mobility Overal bed mobility: Needs Assistance Bed Mobility: Supine to Sit Supine to sit: Mod assist,HOB elevated General bed mobility comments: Pt using R bed rail to pull UB off bed surface and mod (A) to keep trunk off bed surface. pt requires mod (A) to bring R hip out to eob. Transfers Overall transfer level: Needs assistance Equipment used: Rolling walker (2 wheeled) Transfers: Sit to/from [employer] Sit to Stand: Min assist Stand pivot transfers: Min assist General transfer comment: Pt from bed surface and hand over hand for R UE Ambulation/Gait Ambulation/Gait assistance: Min assist Gait Distance (Feet): 20 Feet Assistive device: Rolling walker (2 wheeled) Gait Pattern/deviations: Step-to pattern General Gait Details: pt with shorrt step-to gait, reduced foot clearance bilaterally, increased trunk flexion with fatigue Gait velocity: reduced Gait velocity interpretation: <1.31 ft/sec, indicative of household ambulator    ADL: ADL Overall ADL's : Needs assistance/impaired Grooming: Maximal assistance Upper Body Bathing Details (indicate cue type and reason): wife completed bath on arrival max (A) Lower Body Dressing: Maximal  assistance Lower Body Dressing Details (indicate cue type and reason): pt helping rolling sleeve onto LLE. pt requires total (A) to place prosthetic Toilet Transfer: Minimal assistance,RW General ADL Comments: pt very lethargic but following commands between arousals techniques. pt noted to have facial droop R side, speech slightly more slurred and decreased arousal. wife reports his face is more droopy. OT informed RN and in room at bedside  Cognition: Cognition Overall Cognitive Status: Impaired/Different from baseline Arousal/Alertness: Awake/alert Orientation Level: Oriented X4 Attention: Sustained Sustained Attention: Impaired Sustained Attention Impairment: Verbal basic Memory: Impaired Memory Impairment: Retrieval deficit,Storage deficit (1/4 delayed recall, 4/4 immediate) Awareness: Impaired Awareness Impairment: Emergent impairment (appears intact for speech, not swallow) Problem Solving: Appears intact Executive Function: Self Monitoring Self Monitoring: Impaired Self Monitoring Impairment: Verbal basic Safety/Judgment: Appears intact Cognition Arousal/Alertness:  Lethargic Behavior During Therapy: Flat affect Overall Cognitive Status: Impaired/Different from baseline Area of Impairment: Problem solving Problem Solving: Slow processing General Comments: pt arouses to name call and following commands. pt opens eyes and says "hey darling" in response to therapist present  Physical Exam: Blood pressure (!) 169/89, pulse (!) 109, temperature 97.8 F (36.6 C), temperature source Oral, resp. rate 20, height 5\' 9"  (1.753 m), weight 104.3 kg, SpO2 96 %. Physical Exam Constitutional:      General: He is not in acute distress.    Appearance: He is obese.  HENT:     Head: Normocephalic.     Right Ear: External ear normal.     Left Ear: External ear normal.     Nose: Nose normal.     Mouth/Throat:     Mouth: Mucous membranes are moist.     Pharynx: No oropharyngeal exudate.   Eyes:     Comments: Slow to track to all fields  Cardiovascular:     Rate and Rhythm: Regular rhythm. Tachycardia present.     Heart sounds: No murmur heard. No gallop.   Pulmonary:     Effort: Pulmonary effort is normal. No respiratory distress.     Breath sounds: No wheezing.  Abdominal:     General: Bowel sounds are normal. There is no distension.     Tenderness: There is no abdominal tenderness.  Musculoskeletal:     Cervical back: Normal range of motion and neck supple.     Comments: Left BK stump well formed. Wearing shrinker. BK prosthesis appears intact  Skin:    General: Skin is warm.     Comments: Left BKA site well-healed.  Neurological:     Comments: Patient is alert but appears fatigued.  Mild dysarthria but intelligible. Mild right central 7.   Oriented x3 and follows commands.  Reasonable insight and awareness. Functional memory. RUE 4-/5 prox to distal with PD. LUE 5/5. RLE 4/5 prox to distal. LLE 4+ to 5/5. Senses pain and LT fairly equally in all 4's   Psychiatric:        Mood and Affect: Mood normal.        Behavior: Behavior normal.        Thought Content: Thought content normal.        Judgment: Judgment normal.     Results for orders placed or performed during the hospital encounter of 03/30/20 (from the past 48 hour(s))  Glucose, capillary     Status: Abnormal   Collection Time: 04/01/20  5:05 PM  Result Value Ref Range   Glucose-Capillary 334 (H) 70 - 99 mg/dL    Comment: Glucose reference range applies only to samples taken after fasting for at least 8 hours.   Comment 1 Notify RN    Comment 2 Document in Chart   Glucose, capillary     Status: Abnormal   Collection Time: 04/01/20  9:39 PM  Result Value Ref Range   Glucose-Capillary 267 (H) 70 - 99 mg/dL    Comment: Glucose reference range applies only to samples taken after fasting for at least 8 hours.   Comment 1 Notify RN    Comment 2 Document in Chart   Glucose, capillary     Status: Abnormal    Collection Time: 04/02/20  6:43 AM  Result Value Ref Range   Glucose-Capillary 260 (H) 70 - 99 mg/dL    Comment: Glucose reference range applies only to samples taken after fasting for at least 8 hours.  Comment 1 Notify RN    Comment 2 Document in Chart   Glucose, capillary     Status: Abnormal   Collection Time: 04/02/20 11:31 AM  Result Value Ref Range   Glucose-Capillary 222 (H) 70 - 99 mg/dL    Comment: Glucose reference range applies only to samples taken after fasting for at least 8 hours.  Glucose, capillary     Status: Abnormal   Collection Time: 04/02/20  4:17 PM  Result Value Ref Range   Glucose-Capillary 238 (H) 70 - 99 mg/dL    Comment: Glucose reference range applies only to samples taken after fasting for at least 8 hours.  Glucose, capillary     Status: Abnormal   Collection Time: 04/02/20  9:07 PM  Result Value Ref Range   Glucose-Capillary 232 (H) 70 - 99 mg/dL    Comment: Glucose reference range applies only to samples taken after fasting for at least 8 hours.  Glucose, capillary     Status: Abnormal   Collection Time: 04/03/20  7:57 AM  Result Value Ref Range   Glucose-Capillary 191 (H) 70 - 99 mg/dL    Comment: Glucose reference range applies only to samples taken after fasting for at least 8 hours.  Glucose, capillary     Status: Abnormal   Collection Time: 04/03/20 11:38 AM  Result Value Ref Range   Glucose-Capillary 233 (H) 70 - 99 mg/dL    Comment: Glucose reference range applies only to samples taken after fasting for at least 8 hours.   No results found.     Medical Problem List and Plan: 1.  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  Antithrombotics: -DVT/anticoagulation: Lovenox  -antiplatelet therapy: Aspirin 81 mg daily and Plavix 75 mg daily x3 weeks then Plavix alone 3. Pain Management: Tylenol as needed 4. Mood: Resume  Lexapro 10 mg daily  -antipsychotic agents: N/A 5. Neuropsych: This patient is capable of making decisions on his own behalf. 6. Skin/Wound Care: Routine skin checks 7. Fluids/Electrolytes/Nutrition: Routine in and outs with follow-up chemistries 8.  Dysphagia.  Carb modified nectar thick liquid.  Follow-up speech therapy 9.  Diabetes mellitus with obesity, peripheral neuropathy.  Hemoglobin A1c 8.5.  NovoLog 12 units 3 times daily, Lantus insulin 40 units twice daily.   -Check blood sugars before meals and at bedtime 10.  AKI on CKD stage III.  Baseline creatinine 1.1-1.4.  Follow-up chemistries 11.  Permissive hypertension.  Presently on Toprol-XL 25 mg daily.  Patient on Norvasc 10 mg nightly, Cozaar 100 mg daily, Imdur 30 mg nightly prior to admission.    -Resume meds as needed 12.  Hyperlipidemia.  Zetia/Crestor 13.  CAD with stenting.  Continue aspirin and Plavix 14.  Left BKA 2015.  Patient does have a prosthesis which appears to be in working order but is do for replacement [employer])     Cathlyn Parsons, PA-C 04/03/2020

## 2020-04-03 NOTE — Progress Notes (Signed)
Modified Barium Swallow Progress Note  Patient Details  Name: Jordan [name] MRN: [id number] Date of Birth: July 17, 1940  Today's Date: 04/03/2020  Modified Barium Swallow completed.  Full report located under Chart Review in the Imaging Section.  Brief recommendations include the following:  Clinical Impression  Pt presents with oropharyngeal dysphagia characterized by reduced bolus cohesion and a pharyngeal delay. He demonstrated premature spillage to the valleculae and pyriform sinuses. Penetration (PAS 3, 5) was noted with consecutive swallows of nectar thick liquids via cup administered by SLP and aspiration (PAS 7, 8)  of thin liquids was observed via cup and straw. Episodes of aspiration inconsistently resulted in an ineffective cough. Postural modifications were attempted but without adequate functional benefit. It is recommended that the pt's current diet of regular texture solids and nectar thick liquids be continued and SLP will follow pt for dysphagia treatment.   Swallow Evaluation Recommendations       SLP Diet Recommendations: Regular solids;Nectar thick liquid   Liquid Administration via: Cup;Straw   Medication Administration: Whole meds with liquid (with nectar thick liquids)   Supervision: Staff to assist with self feeding   Compensations: Slow rate;Small sips/bites;Clear throat intermittently   Postural Changes: Seated upright at 90 degrees       Other Recommendations: Order thickener from pharmacy;Clarify dietary restrictions   Jeffifer Rabold I. Vear Clock, MS, CCC-SLP Acute Rehabilitation Services Office number [phone] Pager [phone]  Scheryl Marten 04/03/2020,2:32 PM

## 2020-04-03 NOTE — Progress Notes (Signed)
  Speech Language Pathology Treatment: Dysphagia  Patient Details Name: Jordan [name] MRN: [id number] DOB: 1940-11-26 Today's Date: 04/03/2020 Time: 2263-3354 SLP Time Calculation (min) (ACUTE ONLY): 15 min  Assessment / Plan / Recommendation Clinical Impression  Pt was seen for dysphagia treatment with his wife present. Pt's wife reported that the pt "gets strangled" sometimes during meals, but that she has noticed this less frequently since thickened liquids were started. He was seen during breakfast and consumed a meal of eggs, toast, and grits. He tolerated all solids and nectar thick liquids without overt s/sx of aspiration, but inconsistently exhibited coughing with thin liquids via cup/straw. Oral phase appeared [facility] for trials. A modified barium swallow study is recommended to further assess physiology.    HPI HPI: [name] is a 80 y.o. male with history of coronary artery disease, aortic stenosis, congestive heart failure, diabetes, left BKA as a complication of diabetes, hypertension, hyperlipidemia, presented to the emergency room for evaluation of Multiple neurological symptoms with last known well sometime 03/28/2020 after multiple ER visits to [facility] with OP PCP f/u recommended. Sx included weakness w/ multiple falls, slurred speech, difficulty swallowing, L facial droop. MRI shows small left gangliothalamic infarct. BSE ordered.      SLP Plan  MBS       Recommendations  Diet recommendations: Regular;Nectar-thick liquid Liquids provided via: Cup;Straw Medication Administration: Whole meds with liquid Supervision: Staff to assist with self feeding Compensations: Slow rate;Small sips/bites;Clear throat intermittently Postural Changes and/or Swallow Maneuvers: Seated upright 90 degrees                Oral Care Recommendations: Oral care BID;Staff/trained caregiver to provide oral care Follow up Recommendations: Inpatient Rehab SLP Visit Diagnosis:  Dysphagia, unspecified (R13.10) Plan: MBS       Mckinsley Koelzer I. Vear Clock, MS, CCC-SLP Acute Rehabilitation Services Office number [phone] Pager [phone]               Scheryl Marten 04/03/2020, 9:24 AM

## 2020-04-03 NOTE — Progress Notes (Signed)
Occupational Therapy Treatment Patient Details Name: Jordan [name] MRN: [id number] DOB: 01-Jul-1940 Today's Date: 04/03/2020    History of present illness 80 y.o. 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   OT comments  Session dovetailed with PT as pt more lethargic this session needing MAX A +2 for bed mobility and initial MOD A for static sitting balance EOB. Pt currently requires supervision- set- up assist for seated UB ADLs from EOB and cues to attend and sequence ADL tasks d/t lethargy. Pt alert and oriented x4 and able to follow commands with increased time. Pt would continue to benefit from skilled occupational therapy while admitted and after d/c to address the below listed limitations in order to improve overall functional mobility and facilitate independence with BADL participation. DC plan remains appropriate, will follow acutely per POC.    Follow Up Recommendations  CIR    Equipment Recommendations  3 in 1 bedside commode    Recommendations for Other Services      Precautions / Restrictions Precautions Precautions: Fall Precaution Comments: L prosthetic Required Braces or Orthoses: Other Brace Other Brace: L prosthetic Restrictions Weight Bearing Restrictions: No       Mobility Bed Mobility Overal bed mobility: Needs Assistance Bed Mobility: Sit to Supine     Sit to supine: Max assist;+2 for physical assistance   General bed mobility comments: pt requried MAX A +2 to return to supine needing max multimodal cues to sequence lowering trunk into sidelying and  elevate BLEs back to bed  Transfers Overall transfer level: Needs assistance    Balance Overall balance assessment: Needs assistance Sitting-balance support: Bilateral upper extremity supported;Feet supported Sitting balance-Leahy Scale: Poor Sitting balance - Comments: posterior lean       Standing balance comment: unable to stand today                           ADL either performed or assessed with clinical judgement   ADL Overall ADL's : Needs assistance/impaired     Grooming: Wash/dry face;Oral care;Sitting;Supervision/safety;Set up;Cueing for sequencing Grooming Details (indicate cue type and reason): cues for sequencing as pt lethargic during session, supervision- set up assist from EOB                   Toilet Transfer Details (indicate cue type and reason): defer d/t level of arousal         Functional mobility during ADLs: Maximal assistance;+2 for physical assistance (bed mobility only) General ADL Comments: pt more lethargic this session, following commands with increased time. session focus on BADL reeducation and bed mobility as precursor to higher level functional mobility     Vision       Perception     Praxis      Cognition Arousal/Alertness: Lethargic Behavior During Therapy: Flat affect Overall Cognitive Status: Impaired/Different from baseline Area of Impairment: Problem solving                             Problem Solving: Slow processing General Comments: pt very lethargic and falling asleep, more alert  at EOB, AxOx4        Exercises     Shoulder Instructions       General Comments VSS on RA, pts wife present during session    Pertinent Vitals/ Pain       Pain Assessment: No/denies pain  Home Living                                          Prior Functioning/Environment              Frequency  Min 2X/week        Progress Toward Goals  OT Goals(current goals can now be  found in the care plan section)  Progress towards OT goals: Progressing toward goals  Acute Rehab OT Goals Patient Stated Goal: pt too tired to answer/ wife concerned over fatigue noted in pateitn with facial droop OT Goal Formulation: With family Time For Goal Achievement: 04/15/20 Potential to Achieve Goals: Good  Plan Discharge plan remains appropriate;Frequency remains appropriate    Co-evaluation                 AM-PAC OT "6 Clicks" Daily Activity     Outcome Measure   Help from another person eating meals?: A Lot Help from another person taking care of personal grooming?: A Lot Help from another person toileting, which includes using toliet, bedpan, or urinal?: A Lot Help from another person bathing (including washing, rinsing, drying)?: A Lot Help from another person to put on and taking off regular upper body clothing?: A Lot Help from another person to put on and taking off regular lower body clothing?: A Lot 6 Click Score: 12    End of Session    OT Visit Diagnosis: Unsteadiness on feet (R26.81);Muscle weakness (generalized) (M62.81);Hemiplegia and hemiparesis Hemiplegia - Right/Left: Right Hemiplegia - dominant/non-dominant: Dominant Hemiplegia - caused by: Cerebral infarction   Activity Tolerance Patient tolerated treatment well   Patient Left in bed;with call bell/phone within reach;with bed alarm set;with family/visitor present;Other (comment) (bed in chair position)   Nurse Communication Mobility status        Time: 1315-1330 OT Time Calculation (min): 15 min  Charges: OT General Charges $OT Visit: 1 Visit OT Treatments $Self Care/Home Management : 8-22 mins  Lenor Derrick., COTA/L Acute Rehabilitation Services [phone]    Barron Schmid 04/03/2020, 4:24 PM

## 2020-04-03 NOTE — Progress Notes (Signed)
Inpatient Diabetes Program Recommendations  AACE/ADA: New Consensus Statement on Inpatient Glycemic Control (2015)  Target Ranges:  Prepandial:   less than 140 mg/dL      Peak postprandial:   less than 180 mg/dL (1-2 hours)      Critically ill patients:  140 - 180 mg/dL   Lab Results  Component Value Date   GLUCAP 233 (H) 04/03/2020   HGBA1C 8.5 (H) 04/01/2020    Review of Glycemic Control Results for Jordan [name], Jordan [name] (MRN [id number]) as of 04/03/2020 14:29  Ref. Range 04/02/2020 11:31 04/02/2020 16:17 04/02/2020 21:07 04/03/2020 07:57 04/03/2020 11:38  Glucose-Capillary Latest Ref Range: 70 - 99 mg/dL 786 (H) 767 (H) 209 (H) 191 (H) 233 (H)    Inpatient Diabetes Program Recommendations:     If post prandial blood glucose remain elevated might consider increasing meal coverage to 10 units TID if eats at least 50%.  Will continue to follow while inpatient.  Thank you, Dulce Sellar, RN, BSN Diabetes Coordinator Inpatient Diabetes Program [phone] (team pager from 8a-5p)

## 2020-04-03 NOTE — Progress Notes (Signed)
Patient placed on CPAP for the night. CPAP on auto tirate with 2L bled in. Patient tolerating well at this time.

## 2020-04-03 NOTE — Progress Notes (Signed)
Inpatient Rehab Admissions Coordinator:  Saw pt and wife at bedside.  Pt lethargic today.  He and wife told AC that they have decided to pursue CIR for pt.  Will begin insurance authorization for potential admission.   Wolfgang Phoenix, MS, CCC-SLP Admissions Coordinator [phone]

## 2020-04-03 NOTE — Progress Notes (Addendum)
PROGRESS NOTE  Jordan [name] TKW:[id number] DOB: 06/06/1940 DOA: 03/30/2020 PCP: Jordan Qua, MD  HPI/Recap of past 24 hours: HPI: Jordan [name] is a 80 y.o. [name] with medical history significant of CAD, aortic stenosis, chronic diastolic CHF, insulin-dependent type 2 diabetes, hypertension, hyperlipidemia, CKD stage II-IIIb, carotid stenosis, presenting to the ED from home via EMS for evaluation of left-sided facial droop and hyperglycemia.   Patient took insulin at home prior to arrival. Reportedly seen at [facility] yesterday for these symptoms and discharged. Wife states for the past 3 days patient has had slurred speech, left-sided facial droop, dysphagia, multiple falls, and urinary incontinence. His blood sugars have been high for the past week. Patient denies history of prior stroke. He reports having chronic shortness of breath and cough, no recent change.  He is fully vaccinated against Covid.  ED Course: Vital signs stable.  WBC 6.2, hemoglobin 12.7 (at baseline), platelet count 223K.  Sodium 138, potassium 4.1, chloride 104, bicarb 23, BUN 15, creatinine 1.4, glucose 313.  INR 1.1.  SARS-CoV-2 PCR test and influenza panel both negative.  BNP normal.  High-sensitivity troponin 21.  B12 163.  Brain MRI 03/31/2020 showing a small left gangliothalamic acute infarct.  Chest x-ray showing no active cardiopulmonary disease.  Seen by PT OT with recommendation for CIR.  Speech therapist recommended regular consistency diet with nectar thick liquid.  04/03/20: Patient was seen and examined with his spouse at his bedside.  He reports feeling constipated, bowel regimen started.  Mild dysarthria noted.  Ongoing gradual normalization of BP.  Toprol-XL dose increased to 25 mg daily.  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 HgbA1c  8.5 with goal less than 7.0. VTE prophylaxis - Lovenox 40 mg sq daily  aspirin 81 mg daily prior to admission, now on ASA 81 and plavix 75 DAPT for 3 weeks and then plavix alone.  Therapy recommendations:  CIR.  Speech therapist recommended regular consistency diet with nectar thick liquid.  Type 2 diabetes uncontrolled with hyperglycemia Hemoglobin A1c 8.5 with goal less than 7.0. Levemir dose increased to 27 units twice daily, continue Increase NovoLog to 7 units 3 times daily  Continue insulin sliding scale Continue to avoid hypoglycemia Appreciate diabetes coordinator's assistance.  AKI, likely prerenal in the setting of poor oral intake Baseline creatinine appears to be 1.1 with GFR greater than 60 Presented with creatinine of 1.43 with GFR 50 Creatinine is downtrending 1.2 from 1.4. Continue to avoid nephrotoxins, dehydration, hypotension Continue to monitor urine output. Repeat renal panel in the morning  Dysphagia/mild dysarthria in the setting of acute stroke Continue to work with speech therapy Continue aspiration precautions  B12 deficiency Presented with B12 163 Continue B12 supplementation.  Ambulatory dysfunction post stroke PT OT recommended CIR Continue PT OT with assistance and fall precautions  Constipation Started bowel regimen with Senokot 2 tablets twice daily, Dulcolax suppository 10 mg x 1, MiraLAX once daily as needed.    Code Status: Full code  Family Communication: Updated his wife  at bedside on 04/01/2020.  Disposition Plan: Likely will DC to CIR on 04/04/2020   Consultants:  Neurology, signed off on  03/31/2020  CIR  Procedures:  2D echo  Antimicrobials:  None  DVT prophylaxis: Subcu Lovenox daily  Status is: Inpatient    Dispo:  Patient From: Home  Planned Disposition: Inpatient Rehab  Expected discharge date: 04/04/20  Medically stable for discharge: Yes, awaiting CIR placement.       Objective: Vitals:   04/03/20 0749 04/03/20 1153 04/03/20 1323 04/03/20 1422  BP: (!) 166/78 (!) 169/89 (!) 144/90 (!) 152/77  Pulse: (!) 105 (!) 109 (!) 109 (!) 106  Resp: 20 20 (!) 22 18  Temp: 98.6 F (37 C) 97.8 F (36.6 C) 99.1 F (37.3 C) 99.6 F (37.6 C)  TempSrc: Oral Oral Oral Oral  SpO2: 99% Jordan% 98% 97%  Weight:      Height:        Intake/Output Summary (Last 24 hours) at 04/03/2020 1425 Last data filed at 04/02/2020 2040 Gross per 24 hour  Intake 30 ml  Output 400 ml  Net -370 ml   Filed Weights   04/01/20 1300  Weight: 104.3 kg    Exam:  . General: Jordan [name] pleasant well-nourished well-developed in no acute distress.  Alert oriented x3.  . Cardiovascular: Regular rate and rhythm no rubs or gallops. [name] Respiratory: Clear to auscultation no wheeze or rales.   . Abdomen: Soft nontender no bowel sounds present.  . Musculoskeletal: No lower extremity edema bilaterally.   . Skin: No ulcerative lesions noted.   [name] Psychiatry: Mood is appropriate for condition and setting.  Data Reviewed: CBC: Recent Labs  Lab 03/30/20 1528 04/01/20 0154  WBC 6.2 5.6  NEUTROABS 3.2  --   HGB 12.7* 12.8*  HCT 40.9 37.8*  MCV 90.1 85.7  PLT 223 235   Basic Metabolic Panel: Recent Labs  Lab 03/30/20 1528 04/01/20 0154  NA 138 134*  K 4.1 4.5  CL 104 100  CO2 23 24  GLUCOSE 313* 279*  BUN 15 15  CREATININE 1.43* 1.20  CALCIUM 9.2 9.2   GFR: Estimated Creatinine Clearance: 59.4 mL/min (by C-G formula based on SCr of 1.2 mg/dL). Liver Function Tests: Recent Labs  Lab 03/30/20 1528  AST 37  ALT 45*  ALKPHOS 112  BILITOT 0.4  PROT 7.5   ALBUMIN 3.5   No results for input(s): LIPASE, AMYLASE in the last 168 hours. No results for input(s): AMMONIA in the last 168 hours. Coagulation Profile: Recent Labs  Lab 03/30/20 1528  INR 1.1   Cardiac Enzymes: No results for input(s): CKTOTAL, CKMB, CKMBINDEX, TROPONINI in the last 168 hours. BNP (last 3 results) No results for input(s): PROBNP in the last 8760 hours. HbA1C: Recent Labs    04/01/20 0154  HGBA1C 8.5*   CBG: Recent Labs  Lab 04/02/20 1131 04/02/20 1617 04/02/20 2107 04/03/20 0757 04/03/20 1138  GLUCAP 222* 238* 232* 191* 233*   Lipid Profile: Recent Labs    04/01/20 0154  CHOL 165  HDL 24*  LDLCALC 66  TRIG 694*  CHOLHDL 6.9   Thyroid Function Tests: No results for input(s): TSH, T4TOTAL, FREET4, T3FREE, THYROIDAB in the last 72 hours. Anemia Panel: No results for input(s): VITAMINB12, FOLATE, FERRITIN, TIBC, IRON, RETICCTPCT in the last 72 hours. Urine analysis:    Component Value Date/Time   COLORURINE YELLOW 04/01/2020 0125   APPEARANCEUR CLEAR 04/01/2020 0125   LABSPEC 1.014 04/01/2020 0125  PHURINE 5.0 04/01/2020 0125   GLUCOSEU >=500 (A) 04/01/2020 0125   GLUCOSEU 250 12/19/2010 1625   HGBUR NEGATIVE 04/01/2020 0125   BILIRUBINUR NEGATIVE 04/01/2020 0125   KETONESUR NEGATIVE 04/01/2020 0125   PROTEINUR NEGATIVE 04/01/2020 0125   UROBILINOGEN 0.2 03/20/2014 1549   NITRITE NEGATIVE 04/01/2020 0125   LEUKOCYTESUR NEGATIVE 04/01/2020 0125   Sepsis Labs: @LABRCNTIP (procalcitonin:4,lacticidven:4)  ) Recent Results (from the past 240 hour(s))  Resp Panel by RT-PCR (Flu A&B, Covid) Nasopharyngeal Swab     Status: None   Collection Time: 03/31/20 12:27 AM   Specimen: Nasopharyngeal Swab; Nasopharyngeal(NP) swabs in vial transport medium  Result Value Ref Range Status   SARS Coronavirus 2 by RT PCR NEGATIVE NEGATIVE Final    Comment: (NOTE) SARS-CoV-2 target nucleic acids are NOT DETECTED.  The SARS-CoV-2 RNA is generally  detectable in upper respiratory specimens during the acute phase of infection. The lowest concentration of SARS-CoV-2 viral copies this assay can detect is 138 copies/mL. A negative result does not preclude SARS-Cov-2 infection and should not be used as the sole basis for treatment or other patient management decisions. A negative result may occur with  improper specimen collection/handling, submission of specimen other than nasopharyngeal swab, presence of viral mutation(s) within the areas targeted by this assay, and inadequate number of viral copies(<138 copies/mL). A negative result must be combined with clinical observations, patient history, and epidemiological information. The expected result is Negative.  Fact Sheet for Patients:  04/02/20  Fact Sheet for Healthcare Providers:  BloggerCourse.com  This test is no t yet approved or cleared by the [employer] FDA and  has been authorized for detection and/or diagnosis of SARS-CoV-2 by FDA under an Emergency Use Authorization (EUA). This EUA will remain  in effect (meaning this test can be used) for the duration of the COVID-19 declaration under Section 564(b)(1) of the Act, 21 U.S.C.section 360bbb-3(b)(1), unless the authorization is terminated  or revoked sooner.       Influenza A by PCR NEGATIVE NEGATIVE Final   Influenza B by PCR NEGATIVE NEGATIVE Final    Comment: (NOTE) The Xpert Xpress SARS-CoV-2/FLU/RSV plus assay is intended as an aid in the diagnosis of influenza from Nasopharyngeal swab specimens and should not be used as a sole basis for treatment. Nasal washings and aspirates are unacceptable for Xpert Xpress SARS-CoV-2/FLU/RSV testing.  Fact Sheet for Patients: [place]  Fact Sheet for Healthcare Providers: BloggerCourse.com  This test is not yet approved or cleared by the [employer] FDA  and has been authorized for detection and/or diagnosis of SARS-CoV-2 by FDA under an Emergency Use Authorization (EUA). This EUA will remain in effect (meaning this test can be used) for the duration of the COVID-19 declaration under Section 564(b)(1) of the Act, 21 U.S.C. section 360bbb-3(b)(1), unless the authorization is terminated or revoked.  Performed at [facility], [address], [place], [place]       Studies: No results found.  Scheduled Meds: . aspirin EC  81 mg Oral Daily  . Chlorhexidine Gluconate Cloth  6 each Topical Daily  . clopidogrel  75 mg Oral Daily  . enoxaparin (LOVENOX) injection  40 mg Subcutaneous Daily  . ezetimibe  10 mg Oral QHS  . insulin aspart  0-15 Units Subcutaneous TID WC  . insulin aspart  0-5 Units Subcutaneous QHS  . insulin aspart  7 Units Subcutaneous TID WC  . insulin glargine  27 Units Subcutaneous BID  . metoprolol succinate  12.5 mg  Oral Daily  . rosuvastatin  40 mg Oral Daily  . senna-docusate  2 tablet Oral BID  . vitamin B-12  1,000 mcg Oral Daily    Continuous Infusions:   LOS: 3 days     Kayleen Memos, MD [facility] Pager [phone]  If 7PM-7AM, please contact night-coverage [email or web address] Password [email or web address] 04/03/2020, 2:25 PM

## 2020-04-03 NOTE — PMR Pre-admission (Signed)
PMR Admission Coordinator Pre-Admission Assessment  Patient: Jordan [name] is an 80 y.o., male MRN: [id number] DOB: April 30, 1940 Height: _0  (175.3 cm) Weight: 104.3 kg              Insurance Information HMO:     PPO: yes     PCP:      IPA:      80/20:      OTHER:  PRIMARY: [place]; not contracted  SECONDARY: [employer]      Policy#: [id number]      Subscriber: patient CM Name: approved by [name] with f/u by [facility] Phone#: [phone]     Fax#: clinical updates due to [facility] 5 days from Admission. Call [name] if admitted after 1/5. Clinical updates a's fax is  [phone] Pre-Cert#: [id number]  For 5 days    Employer Benefits:  Phone #: n/[email or web address] at [email or web address]     Name:  [name] Date: 04/02/19-present     Deduct: [place] not have for [place]      Out of Pocket Max: $4,000 ($0 met)      Life Max: NA  CIR: $160/day co-pay for days 1-10, $0/day co-pay for days 11-90 SNF: 100% for days 1-20, 0% co-insurance; $50 co-pay per day for days 21-100; after 100 days coverage pays $0 Outpatient: $20/visit co-pay pending service; visits limited to medical necessity     Co-Pay:  Home Health: 100% coverage, 0% co-insurance; limited by medical necessity      Co-Pay:  DME: 80%     Co-Pay: 20% Providers: Writer:       Phone#:   [employer]" for patients in Inpatient Rehabilitation Facilities with attached "Privacy Act [place] Records" was provided and verbally reviewed with: Patient  Emergency Contact Information Contact Information    Name Relation Home Work [place] Spouse [phone]  [phone]     Current Medical History  Patient Admitting Diagnosis: 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 male with history of CAD maintained on aspirin, aortic stenosis, chronic diastolic congestive heart failure, insulin-dependent  diabetes mellitus hypertension hyperlipidemia, CKD stage III, carotid stenosis.  Per chart review lives with spouse independent with assistive device 1 level home ramped entrance.  Good family support.  Presented 03/31/2020 with gait abnormality slurred speech, left facial droop and multiple falls.  MRI showed small left gangliothalamic acute infarct no hemorrhage or mass-effect.  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  Negative for large vessel occlusion.  No evidence of hemodynamically significant ICA or vertebral artery stenosis in the neck.  Admission chemistries unremarkable except glucose 313, creatinine 1.43, hemoglobin 12.7, BNP 32, troponin 21-15.  Echocardiogram ejection fraction of 60 to 65% no wall motion abnormalities grade 1 diastolic dysfunction.  Currently maintained on aspirin and Plavix for CVA prophylaxis x3 weeks then Plavix alone.  Subcutaneous Lovenox for DVT prophylaxis.  Carb modified diet with nectar thick liquids.  Physical medicine rehab consult requested due to patient's decreased functional mobility and slurred speech.  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 He uses a cane when ambulating. His wife does assist him with donning sock and shoe on the right side. The patient also needs assist washing his back and otherwise provided set up assistance for clothing. The patient was otherwise modified independent. He has not  driven for a couple years. The patient notes visual problems due to his diabetes and has some difficulty reading although he can still read.  Complete NIHSS TOTAL: 1 Glasgow Coma Scale Score: 15  Past Medical History  Past Medical History:  Diagnosis Date  . Anemia   . Aortic stenosis   . CAD (coronary artery disease)    a. s/p IMI in past tx with POBA and cath 6 mos later with occluded RCA;  b. h/o Taxus DES to LAD and CFX;  c.  cath  1/10: LM ok, mLAD 40-50%, LAD stent ok, D2 tandem 60-70%, pCFX 30%, mAVCFX stent ok, pRCA 40%, RV 90%, RV marginal 90%, dRCA filled L-R collats tx medically  . Carotid stenosis    dopplers 5/11: 40-59% bilat  . CHF (congestive heart failure) ([place])   . Diabetic ulcer of left foot ([place])   . DM2 (diabetes mellitus, type 2) ([place])   . Heart murmur   . HLD (hyperlipidemia)   . HTN (hypertension)   . Myocardial infarction ([place]) 1995 and 2005   Heart Attack  . Osteomyelitis of left foot ([place])   . Staphylococcus aureus bacteremia with sepsis [facility])     Family History  family history includes Cancer in his father, mother, sister, and sister; Diabetes in his mother and sister; Heart disease in his mother; Hyperlipidemia in his mother; Hypertension in his mother.  Prior Rehab/Hospitalizations:  Has the patient had prior rehab or hospitalizations prior to admission? No  Has the patient had major surgery during 100 days prior to admission? No  Current Medications   Current Facility-Administered Medications:  .  acetaminophen (TYLENOL) tablet 650 mg, 650 mg, Oral, Q4H PRN **OR** acetaminophen (TYLENOL) 160 MG/5ML solution 650 mg, 650 mg, Per Tube, Q4H PRN **OR** acetaminophen (TYLENOL) suppository 650 mg, 650 mg, Rectal, Q4H PRN, [name], MD .  aspirin EC tablet 81 mg, 81 mg, Oral, Daily, Rosalin Hawking, MD, 81 mg at 04/07/20 0905 .  Chlorhexidine Gluconate Cloth 2 % PADS 6 each, 6 each, Topical, Daily, Kayleen Memos, DO, 6 each at 04/07/20 8119 .  clopidogrel (PLAVIX) tablet 75 mg, 75 mg, Oral, Daily, Rosalin Hawking, MD, 75 mg at 04/07/20 0905 .  enoxaparin (LOVENOX) injection 40 mg, 40 mg, Subcutaneous, Daily, [name], MD, 40 mg at 04/07/20 0906 .  ezetimibe (ZETIA) tablet 10 mg, 10 mg, Oral, QHS, Rosalin Hawking, MD, 10 mg at 04/06/20 2222 .  hydrALAZINE (APRESOLINE) injection 5 mg, 5 mg, Intravenous, Q6H PRN, Rai, Ripudeep K, MD .  insulin aspart (novoLOG) injection 0-20 Units, 0-20  Units, Subcutaneous, TID WC, [place] Eric J, DO, 4 Units at 04/07/20 [phone] .  insulin aspart (novoLOG) injection 0-5 Units, 0-5 Units, Subcutaneous, QHS, [name], MD, 3 Units at 04/04/20 2142 .  insulin aspart (novoLOG) injection 14 Units, 14 Units, Subcutaneous, TID WC, [place] Eric J, DO, [place] Units at 04/07/20 [phone] .  insulin glargine (LANTUS) injection 50 Units, 50 Units, Subcutaneous, BID, [place] Eric J, DO, 50 Units at 04/07/20 2130 .  metoprolol succinate (TOPROL-XL) 24 hr tablet 25 mg, 25 mg, Oral, Daily, Hall, Carole N, DO, 25 mg at 04/07/20 0905 .  polyethylene glycol (MIRALAX / GLYCOLAX) packet 17 g, 17 g, Oral, Daily PRN, Irene Pap N, DO, 17 g at 04/03/20 0836 .  [employer], , Oral, PRN, Kayleen Memos, DO .  rosuvastatin (CRESTOR) tablet 40 mg, 40 mg, Oral, Daily, Rosalin Hawking, MD, 40 mg at 04/07/20 0907 .  senna-docusate (  Senokot-S) tablet 2 tablet, 2 tablet, Oral, BID, Kayleen Memos, DO, 2 tablet at 04/07/20 0905 .  vitamin B-12 (CYANOCOBALAMIN) tablet 1,000 mcg, 1,000 mcg, Oral, Daily, [name], MD, 1,000 mcg at 04/07/20 0905  Patients Current Diet:  Diet Order            Diet - low sodium heart healthy           Diet Carb Modified Fluid consistency: Nectar Thick; Room service appropriate? Yes  Diet effective now                 Precautions / Restrictions Precautions Precautions: Fall Precaution Comments: L prosthetic Other Brace: L prosthetic Restrictions Weight Bearing Restrictions: No   Has the patient had 2 or more falls or a fall with injury in the past year?Yes  Prior Activity Level Limited Community (1-2x/wk): trips to [employer] and church  Prior Functional Level Prior Function Level of Independence: Independent with assistive device(s) ADL's / Homemaking Assistance Needed: normally completes all adls indep Comments: pt ambulates with use of cane vs RW, does require some assistance with bathing  Self Care: Did the  patient need help bathing, dressing, using the toilet or eating?  Needed some help  Indoor Mobility: Did the patient need assistance with walking from room to room (with or without device)? Independent  Stairs: Did the patient need assistance with internal or external stairs (with or without device)? Needed some help  Functional Cognition: Did the patient need help planning regular tasks such as shopping or remembering to take medications? Independent  Home Assistive Devices / Equipment Home Assistive Devices/Equipment: Prosthesis Home Equipment: Cane - single point,Walker - 2 wheels,Walker - 4 wheels  Prior Device Use: Indicate devices/aids used by the patient prior to current illness, exacerbation or injury? Cane. Occassionally used a walker or rollator  Current Functional Level Cognition  Arousal/Alertness: Awake/alert Overall Cognitive Status: Impaired/Different from baseline Current Attention Level: Sustained Orientation Level: Oriented X4 Following Commands: Follows one step commands with increased time Safety/Judgement: Decreased awareness of safety,Decreased awareness of deficits General Comments: pt with slow response times this session, more alert this session, awake and eating his lunch. Pt oriented x4 Attention: Sustained Sustained Attention: Impaired Sustained Attention Impairment: Verbal basic Memory: Impaired Memory Impairment: Retrieval deficit,Storage deficit (1/4 delayed recall, 4/4 immediate) Awareness: Impaired Awareness Impairment: Emergent impairment (appears intact for speech, not swallow) Problem Solving: Appears intact Executive Function: Self Monitoring Self Monitoring: Impaired Self Monitoring Impairment: Verbal basic Safety/Judgment: Appears intact    Extremity Assessment (includes Sensation/Coordination)  Upper Extremity Assessment: RUE deficits/detail RUE Deficits / Details: utilizing RUE to self-feed, weak greasp on fork and demonstrated  decreased proprioception when scooping/stabbing food, demonstrated decreased fine motor coordination and poor inhand manipulation skills RUE Coordination: decreased fine motor  Lower Extremity Assessment: Defer to PT evaluation RLE Sensation: history of peripheral neuropathy LLE Deficits / Details: L BKA LLE Sensation: decreased light touch    ADLs  Overall ADL's : Needs assistance/impaired Eating/Feeding: Set up,Sitting,Bed level Eating/Feeding Details (indicate cue type and reason): pt eating lunch from tray after setup assistance;pt demonstrated fine motor limitations in RUE, required increased effort and time to scoop food. Grooming: Wash/dry face,Oral care,Sitting,Supervision/safety,Set up,Cueing for sequencing Grooming Details (indicate cue type and reason): cues for sequencing as pt lethargic during session, supervision- set up assist from EOB Upper Body Bathing Details (indicate cue type and reason): wife completed bath on arrival max (A) Lower Body Dressing: Maximal assistance Lower Body Dressing Details (indicate cue type and  reason): pt helping rolling sleeve onto LLE. pt requires total (A) to place prosthetic Toilet Transfer: Minimal Technical brewer Details (indicate cue type and reason): defer d/t level of arousal Functional mobility during ADLs: Maximal assistance,+2 for physical assistance (bed mobility only) General ADL Comments: pt declined mobility at this time requesting to finish his lunch    Mobility  Overal bed mobility: Needs Assistance Bed Mobility: Supine to Sit Supine to sit: HOB elevated,Mod assist Sit to supine: Max assist,+2 for physical assistance General bed mobility comments: modA to elevate trunk into sitting    Transfers  Overall transfer level: Needs assistance Equipment used: Rolling walker (2 wheeled),1 person hand held assist Transfer via Lift Equipment: Stedy Transfers: Sit to/from [employer] to Stand: Mod  assist,From elevated surface Stand pivot transfers: Mod assist,Min assist General transfer comment: modA for sit to stand transfers, pt requires cues for hand placement and sequencing. MinA for step and turn transfers with RW initially, modA with fatigue    Ambulation / Gait / Stairs / Wheelchair Mobility  Ambulation/Gait Ambulation/Gait assistance: Herbalist (Feet): 20 Feet Assistive device: Rolling walker (2 wheeled) Gait Pattern/deviations: Step-to pattern General Gait Details: pt with short step-to gait, reduced foot clearance of RLE, increased trunk flexion Gait velocity: reduced Gait velocity interpretation: <1.31 ft/sec, indicative of household ambulator    Posture / Balance Dynamic Sitting Balance Sitting balance - Comments: reliant on UE support of bed and minG Balance Overall balance assessment: Needs assistance Sitting-balance support: Single extremity supported,Bilateral upper extremity supported,Feet supported Sitting balance-Leahy Scale: Poor Sitting balance - Comments: reliant on UE support of bed and minG Postural control: Right lateral lean Standing balance support: Bilateral upper extremity supported Standing balance-Leahy Scale: Poor Standing balance comment: reliant on BUE support of RW and minG-minA for static standing High Level Balance Comments: worked on maintaining midline in perched position    Special needs/care consideration Skin amputation: Left leg; Cracking: Right leg, Diabetic management novoLOG: 0-15 units 3x daily with meals; novoLOG: 7 units 3x daily with meals; Lantus: 27 units 2x/day; novoLOG: 0-5 units daily at bedtime, Bladder incontinence; external urinary catheter and Designated visitor Jordan [name], wife     Previous Home Environment  Living Arrangements: Spouse/significant other  Lives With: Spouse Available Help at Discharge: Family,Available 24 hours/day Type of Home: House Home Layout: One level Home Access: Ramped  entrance Bathroom Shower/Tub: Multimedia programmer: Handicapped height Bathroom Accessibility: Yes How Accessible: Accessible via walker [place]: No Additional Comments: wife present and reports pt normally has additional sleeves for prosthetic but didnt grab them in a rush to leave with EMS  Discharge Living Setting Plans for Discharge Living Setting: Patient's home Type of Home at Discharge: House Discharge Home Layout: One level Discharge Home Access: [place] entrance Discharge Bathroom Shower/Tub: Walk-in shower Discharge Bathroom Toilet: Handicapped height Discharge Bathroom Accessibility: Yes How Accessible: Accessible via walker Does the patient have any problems obtaining your medications?: No  Social/Family/Support Systems Anticipated Caregiver: Harce Volden, wife Anticipated Caregiver's Contact Information: [phone] Caregiver Availability: 24/7 Discharge Plan Discussed with Primary Caregiver: Yes Is Caregiver In Agreement with Plan?: Yes Does Caregiver/Family have Issues with Lodging/Transportation while Pt is in Rehab?: No   Goals Patient/Family Goal for Rehab: supervision PT/OT/ST Expected length of stay: 10-14 days Pt/Family Agrees to Admission and willing to participate: Yes Program Orientation Provided & Reviewed with Pt/Caregiver Including Roles  & Responsibilities: Yes   Decrease burden of Care through IP rehab admission: NA  Possible need for SNF placement upon discharge:NA   Patient Condition: This patient's medical and functional status has changed since the consult dated: 04/03/2020  in which the Rehabilitation Physician determined and documented that the patient's condition is appropriate for intensive rehabilitative care in an inpatient rehabilitation facility. See "History of Present Illness" (above) for medical update. Functional changes are: lethargy resolving, mod-max A with therapies. Patient's medical and functional status  update has been discussed with the Rehabilitation physician and patient remains appropriate for inpatient rehabilitation. Will admit to inpatient rehab today.  Preadmission Screen Completed By: Clemens Catholic with updates by Cleatrice Burke, RN, 04/07/2020 11:02 AM ______________________________________________________________________   Discussed status with Dr. Naaman Plummer on 04/07/2020 at  1103 and received approval for admission today.  Admission Coordinator: Clemens Catholic with updates by  Cleatrice Burke, time 4287 Date 04/07/2020

## 2020-04-03 NOTE — Progress Notes (Signed)
Physical Therapy Treatment Patient Details Name: Jordan [name] MRN: [id number] DOB: 28-May-1940 Today's Date: 04/03/2020    History of Present Illness 80 y.o. 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    PT Comments    Pt with increased lethargy today requiring increased assist of maxAX2 for mobility this date. Pt unable to stand today when he was able to last PT session on 04/01/2020. Pt remains orientedx4 however has difficulty staying awake. Pt with significant posterior lean requiring modA initially to maintain EOB sitting, pt progressed to close min guard. RN aware this is a change in status for patient compared to yesterday. Acute PT to cont to follow.    Follow Up Recommendations  CIR     Equipment Recommendations  None recommended by PT    Recommendations for Other Services Rehab consult     Precautions / Restrictions Precautions Precautions: Fall Precaution Comments: L prosthetic Required Braces or Orthoses: Other Brace Restrictions Weight Bearing Restrictions: No    Mobility  Bed Mobility Overal bed mobility: Needs Assistance Bed Mobility: Supine to Sit;Sit to Supine     Supine to sit: Max assist Sit to supine: Max assist;+2 for physical assistance   General bed mobility comments: max directional verbal and tactile cues to reach across with R UE to railing and pull self to EOB, ultimately requiring maxA for trunk elevation and then control of trunk and LE management for return to supine  Transfers Overall transfer level: Needs assistance Equipment used:  Rolling walker (2 wheeled) Transfers: Sit to/from Stand Sit to Stand: Total assist         General transfer comment: attempted to stand x2 wtih maxAX2 however unable to clear bottom from bed  Ambulation/Gait                 Stairs             Wheelchair Mobility    Modified Rankin (Stroke Patients Only) Modified Rankin (Stroke Patients Only) Pre-Morbid Rankin Score: Moderate disability Modified Rankin: Moderately severe disability     Balance Overall balance assessment: Needs assistance Sitting-balance support: Bilateral upper extremity supported;Feet supported Sitting balance-Leahy Scale: Poor Sitting balance - Comments: posterior lean       Standing balance comment: unable to stand today                            Cognition Arousal/Alertness: Lethargic Behavior During Therapy: Flat affect Overall Cognitive Status: Impaired/Different from baseline Area of Impairment: Problem solving                             Problem Solving: Slow processing General Comments: pt very lethargic and falling asleep, more alert at EOB,      Exercises      General Comments General comments (skin integrity, edema, etc.): VSS on RA      Pertinent Vitals/Pain Pain Assessment: No/denies pain    Home Living   Living Arrangements: Spouse/significant other Available Help at Discharge: Family;Available 24 hours/day Type of Home: House Home Access: Ramped entrance   Home Layout:  One level        Prior Function            PT Goals (current goals can now be found in the care plan section) Progress towards PT goals: Not progressing toward goals - comment (pt with increased lethargy)    Frequency    Min 4X/week      PT Plan Current plan remains appropriate    Co-evaluation              AM-PAC PT "6 Clicks" Mobility   Outcome Measure  Help needed turning from your back to your side while in a flat bed without using  bedrails?: A Lot Help needed moving from lying on your back to sitting on the side of a flat bed without using bedrails?: A Lot Help needed moving to and from a bed to a chair (including a wheelchair)?: A Lot Help needed standing up from a chair using your arms (e.g., wheelchair or bedside chair)?: Total Help needed to walk in hospital room?: Total Help needed climbing 3-5 steps with a railing? : Total 6 Click Score: 9    End of Session Equipment Utilized During Treatment: Gait belt Activity Tolerance: Patient limited by lethargy Patient left: in bed;with call bell/phone within reach;with bed alarm set;with nursing/sitter in room;with family/visitor present Nurse Communication: Mobility status PT Visit Diagnosis: Other abnormalities of gait and mobility (R26.89);Unsteadiness on feet (R26.81);Muscle weakness (generalized) (M62.81)     Time: 2751-7001 PT Time Calculation (min) (ACUTE ONLY): 19 min  Charges:  $Therapeutic Activity: 8-22 mins                     Jordan [name], PT, DPT Acute Rehabilitation Services Pager #: [phone] Office #: [phone]    Jordan [name] 04/03/2020, 2:06 PM

## 2020-04-04 DIAGNOSIS — Z794 Long term (current) use of insulin: Secondary | ICD-10-CM

## 2020-04-04 DIAGNOSIS — E1122 Type 2 diabetes mellitus with diabetic chronic kidney disease: Secondary | ICD-10-CM | POA: Diagnosis not present

## 2020-04-04 DIAGNOSIS — I5032 Chronic diastolic (congestive) heart failure: Secondary | ICD-10-CM

## 2020-04-04 DIAGNOSIS — I10 Essential (primary) hypertension: Secondary | ICD-10-CM

## 2020-04-04 DIAGNOSIS — E7800 Pure hypercholesterolemia, unspecified: Secondary | ICD-10-CM | POA: Diagnosis not present

## 2020-04-04 LAB — GLUCOSE, CAPILLARY
Glucose-Capillary: 267 mg/dL — ABNORMAL HIGH (ref 70–99)
Glucose-Capillary: 356 mg/dL — ABNORMAL HIGH (ref 70–99)
Glucose-Capillary: 301 mg/dL — ABNORMAL HIGH (ref 70–99)
Glucose-Capillary: 270 mg/dL — ABNORMAL HIGH (ref 70–99)

## 2020-04-04 MED ORDER — INSULIN ASPART 100 UNIT/ML ~~LOC~~ SOLN
0.0000 [IU] | Freq: Three times a day (TID) | SUBCUTANEOUS | Status: DC
  Administered 2020-04-05 (×3): 7 [IU] via SUBCUTANEOUS
  Administered 2020-04-06: 4 [IU] via SUBCUTANEOUS
  Administered 2020-04-06: 3 [IU] via SUBCUTANEOUS
  Administered 2020-04-07: 4 [IU] via SUBCUTANEOUS
  Administered 2020-04-07: 3 [IU] via SUBCUTANEOUS

## 2020-04-04 MED ORDER — INSULIN GLARGINE 100 UNIT/ML ~~LOC~~ SOLN
35.0000 [IU] | Freq: Two times a day (BID) | SUBCUTANEOUS | Status: DC
  Administered 2020-04-04 (×2): 35 [IU] via SUBCUTANEOUS
  Filled 2020-04-04 (×4): qty 0.35

## 2020-04-04 MED ORDER — INSULIN ASPART 100 UNIT/ML ~~LOC~~ SOLN
10.0000 [IU] | Freq: Three times a day (TID) | SUBCUTANEOUS | Status: DC
  Administered 2020-04-04 (×2): 10 [IU] via SUBCUTANEOUS

## 2020-04-04 NOTE — Progress Notes (Signed)
PROGRESS NOTE    Jordan [name]  YWV:[id number] DOB: 1940-10-06 DOA: 03/30/2020 PCP: [name], MD    Brief Narrative:  Jordan [name] is a 80 year old male with past medical history significant for CAD, aortic stenosis, chronic diastolic congestive heart failure, type 2 diabetes mellitus, essential hypertension, hyperlipidemia, CKD stage IIIb, carotid artery stenosis who presented to the ED via EMS for evaluation of left-sided facial droop and hyperglycemia.  Patient spouse reports for the past 3 days patient has had slurred speech, left-sided facial droop, dysphagia with multiple falls and urinary incontinence.  Blood sugars have been elevated for the past week.  Denies history of prior stroke.  Fully vaccinated gets Covid-19.  Was reportedly seen at [facility] day prior for the symptoms and discharged home.  In the ED, vital signs stable.  WBC 6.2, hemoglobin 12.7, platelet 223, sodium 138, potassium 4.1, chloride 104, bicarb 23, BUN 15, creatinine 1.4, glucose 313, INR 1.1.  SARS-CoV-2 PCR and influenza a/B PCR negative.  BNP within normal limits.  High-sensitivity troponin XX 1.  B12 163.  Chest x-ray with no acute cardiopulmonary disease process.  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  Neurology was consulted.  Hospital service consulted for further evaluation and management of acute stroke.   Assessment & Plan:   Principal Problem:   Stroke [facility]) Active Problems:   Hyperlipidemia   Essential hypertension   Type 2 diabetes mellitus with diabetic chronic kidney disease (HCC)   Chronic diastolic heart failure (HCC)   Acute ischemic CVA Patient presenting to the ED with 3-day history of multiple falls, slurred speech, left-sided facial droop and dysphagia.  MR brain showing small left gangliothalamic acute infarct without hemorrhage or mass-effect.  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  TTE with LVEF 60 to 65%, no regional wall motion abnormalities, grade 1 diastolic dysfunction, LA mildly dilated, moderate aortic valve sclerosis, IVC normal in size.  Total cholesterol 165, LDL 66, triglycerides 374, HDL 24.  Hemoglobin A1c 8.5.  Neurology was consulted and followed during hospital course. --PT: CIR --OT: CIR --SLP: Solids, nectar thick liquids, home meds with liquid, aspiration precautions --Continue DAPT with aspirin 81 mg p.o. daily, Plavix 75 mg p.o. daily x3 weeks followed by Plavix alone  Dysphagia/Mild dysarthria in the setting of acute CVA --Continue SLP efforts --Pending CIR admission  Acute renal failure Baseline creatinine 1.1 with a GFR greater than 60.  Presented with a creatinine of 1.43 with a GFR of 50, likely secondary to prerenal azotemia poor oral intake take in the days preceding hospitalization. --Cr 1.43>1.2 --Avoid nephrotoxins, renally dose all medications --BMP in the a.m.  Essential hypertension BP 127/56 --Metoprolol succinate 25 mg p.o. daily --Hydralazine 5mg  IV q6h prn SBP >175   Hyperlipidemia --Zetia 10 mg p.o. nightly --Crestor 40 mg p.o. daily  B12 deficiency B12 level 163. --Continue B12 supplementation  Chronic diastolic congestive heart failure TTE with LVEF 60 to 65% with grade 1 diastolic dysfunction. --Metoprolol succinate 25 mg p.o. daily  Type 2 diabetes mellitus Hemoglobin A1c 8.5, poorly controlled. --Diabetic educator following, appreciate assistance --Lantus 35 units BID --NovoLog 10 units TIDAC --ISS for further coverage --CBGs QAC/HS   DVT prophylaxis: Lovenox Code Status: Full code Family Communication: Updated patient extensively at bedside  Disposition Plan:  Status is: Inpatient  Remains  inpatient appropriate  because:Unsafe d/c plan and Inpatient level of care appropriate due to severity of illness   Dispo:  Patient From: Home  Planned Disposition: Inpatient Rehab  Expected discharge date: 04/05/2020  Medically stable for discharge: Yes    Consultants:   Neurology  Procedures:   TTE  Antimicrobials:   None   Subjective: Patient seen and examined bedside, resting comfortably.  No specific complaints this morning.  Awaiting CIR for insurance authorization and bed.  Denies headache, no fever/chills/night sweats, no nausea/vomiting/diarrhea, no chest pain, no palpitations, no abdominal pain, no weakness, no fatigue, no paresthesias.  No acute events overnight per nursing staff.  Objective: Vitals:   04/04/20 0405 04/04/20 0805 04/04/20 1216 04/04/20 1449  BP: (!) 163/100 (!) 150/80 104/65 (!) 127/56  Pulse: 98 94 90 80  Resp: 20 18 18 16   Temp: 98.2 F (36.8 C) 99.1 F (37.3 C) 98.5 F (36.9 C) 98 F (36.7 C)  TempSrc: Oral Oral Oral Oral  SpO2: 96% 99% 94% 97%  Weight:      Height:        Intake/Output Summary (Last 24 hours) at 04/04/2020 1602 Last data filed at 04/04/2020 0100 Gross per 24 hour  Intake --  Output 950 ml  Net -950 ml   Filed Weights   04/01/20 1300  Weight: 104.3 kg    Examination:  General exam: Appears calm and comfortable  Respiratory system: Clear to auscultation. Respiratory effort normal.  Oxygen well on room air Cardiovascular system: S1 & S2 heard, RRR. No JVD, murmurs, rubs, gallops or clicks. No pedal edema. Gastrointestinal system: Abdomen is nondistended, soft and nontender. No organomegaly or masses felt. Normal bowel sounds heard. Central nervous system: Alert and oriented. No focal neurological deficits. Extremities: Symmetric 5 x 5 power. Skin: No rashes, lesions or ulcers Psychiatry: Judgement and insight appear normal. Mood & affect appropriate.     Data Reviewed: I have personally reviewed following labs and imaging  studies  CBC: Recent Labs  Lab 03/30/20 1528 04/01/20 0154  WBC 6.2 5.6  NEUTROABS 3.2  --   HGB 12.7* 12.8*  HCT 40.9 37.8*  MCV 90.1 85.7  PLT 223 235   Basic Metabolic Panel: Recent Labs  Lab 03/30/20 1528 04/01/20 0154  NA 138 134*  K 4.1 4.5  CL 104 100  CO2 23 24  GLUCOSE 313* 279*  BUN 15 15  CREATININE 1.43* 1.20  CALCIUM 9.2 9.2   GFR: Estimated Creatinine Clearance: 59.4 mL/min (by C-G formula based on SCr of 1.2 mg/dL). Liver Function Tests: Recent Labs  Lab 03/30/20 1528  AST 37  ALT 45*  ALKPHOS 112  BILITOT 0.4  PROT 7.5  ALBUMIN 3.5   No results for input(s): LIPASE, AMYLASE in the last 168 hours. No results for input(s): AMMONIA in the last 168 hours. Coagulation Profile: Recent Labs  Lab 03/30/20 1528  INR 1.1   Cardiac Enzymes: No results for input(s): CKTOTAL, CKMB, CKMBINDEX, TROPONINI in the last 168 hours. BNP (last 3 results) No results for input(s): PROBNP in the last 8760 hours. HbA1C: No results for input(s): HGBA1C in the last 72 hours. CBG: Recent Labs  Lab 04/03/20 1138 04/03/20 1550 04/03/20 2124 04/04/20 0616 04/04/20 1111  GLUCAP 233* 234* 201* 267* 356*   Lipid Profile: No results for input(s): CHOL, HDL, LDLCALC, TRIG, CHOLHDL, LDLDIRECT in the last 72 hours. Thyroid Function Tests: No results for input(s): TSH, T4TOTAL, FREET4, T3FREE, THYROIDAB in the last 72 hours. Anemia Panel:  No results for input(s): VITAMINB12, FOLATE, FERRITIN, TIBC, IRON, RETICCTPCT in the last 72 hours. Sepsis Labs: No results for input(s): PROCALCITON, LATICACIDVEN in the last 168 hours.  Recent Results (from the past 240 hour(s))  Resp Panel by RT-PCR (Flu A&B, Covid) Nasopharyngeal Swab     Status: None   Collection Time: 03/31/20 12:27 AM   Specimen: Nasopharyngeal Swab; Nasopharyngeal(NP) swabs in vial transport medium  Result Value Ref Range Status   SARS Coronavirus 2 by RT PCR NEGATIVE NEGATIVE Final    Comment:  (NOTE) SARS-CoV-2 target nucleic acids are NOT DETECTED.  The SARS-CoV-2 RNA is generally detectable in upper respiratory specimens during the acute phase of infection. The lowest concentration of SARS-CoV-2 viral copies this assay can detect is 138 copies/mL. A negative result does not preclude SARS-Cov-2 infection and should not be used as the sole basis for treatment or other patient management decisions. A negative result may occur with  improper specimen collection/handling, submission of specimen other than nasopharyngeal swab, presence of viral mutation(s) within the areas targeted by this assay, and inadequate number of viral copies(<138 copies/mL). A negative result must be combined with clinical observations, patient history, and epidemiological information. The expected result is Negative.  Fact Sheet for Patients:  EntrepreneurPulse.com.au  Fact Sheet for Healthcare Providers:  IncredibleEmployment.be  This test is no t yet approved or cleared by the [place] FDA and  has been authorized for detection and/or diagnosis of SARS-CoV-2 by FDA under an Emergency Use Authorization (EUA). This EUA will remain  in effect (meaning this test can be used) for the duration of the COVID-19 declaration under Section 564(b)(1) of the Act, 21 U.S.C.section 360bbb-3(b)(1), unless the authorization is terminated  or revoked sooner.       Influenza A by PCR NEGATIVE NEGATIVE Final   Influenza B by PCR NEGATIVE NEGATIVE Final    Comment: (NOTE) The Xpert Xpress SARS-CoV-2/FLU/RSV plus assay is intended as an aid in the diagnosis of influenza from Nasopharyngeal swab specimens and should not be used as a sole basis for treatment. Nasal washings and aspirates are unacceptable for Xpert Xpress SARS-CoV-2/FLU/RSV testing.  Fact Sheet for Patients: EntrepreneurPulse.com.au  Fact Sheet for Healthcare  Providers: IncredibleEmployment.be  This test is not yet approved or cleared by the [place] FDA and has been authorized for detection and/or diagnosis of SARS-CoV-2 by FDA under an Emergency Use Authorization (EUA). This EUA will remain in effect (meaning this test can be used) for the duration of the COVID-19 declaration under Section 564(b)(1) of the Act, 21 U.S.C. section 360bbb-3(b)(1), unless the authorization is terminated or revoked.  Performed at [facility], [place] [address] [place], [place] [address]          Radiology Studies: No results found.      Scheduled Meds: . aspirin EC  81 mg Oral Daily  . Chlorhexidine Gluconate Cloth  6 each Topical Daily  . clopidogrel  75 mg Oral Daily  . enoxaparin (LOVENOX) injection  40 mg Subcutaneous Daily  . ezetimibe  10 mg Oral QHS  . insulin aspart  0-15 Units Subcutaneous TID WC  . insulin aspart  0-5 Units Subcutaneous QHS  . insulin aspart  10 Units Subcutaneous TID WC  . insulin glargine  35 Units Subcutaneous BID  . metoprolol succinate  25 mg Oral Daily  . rosuvastatin  40 mg Oral Daily  . senna-docusate  2 tablet Oral BID  . vitamin B-12  1,000 mcg Oral Daily  Continuous Infusions:   LOS: 4 days    Time spent: 39 minutes spent on chart review, discussion with nursing staff, consultants, updating family and interview/physical exam; more than 50% of that time was spent in counseling and/or coordination of care.    Alvira Philips [place], DO [facility] Available via Epic secure chat 7am-7pm After these hours, please refer to coverage provider listed on [email or web address] 04/04/2020, 4:02 PM

## 2020-04-04 NOTE — Progress Notes (Signed)
Inpatient Rehab Admissions Coordinator:   Met with Pt. And family for ongoing discussion regarding CIR admit. Pt. Remains interested in CIR. I do not have insurance auth or a bed available for Pt. Today. Will continue to pursue for potential admit pending insurance auth and bed availability.   Clemens Catholic, [place], [place] Admissions Coordinator  [phone] ([place]) [phone] (office)

## 2020-04-04 NOTE — Progress Notes (Signed)
Inpatient Diabetes Program Recommendations  AACE/ADA: New Consensus Statement on Inpatient Glycemic Control (2015)  Target Ranges:  Prepandial:   less than 140 mg/dL      Peak postprandial:   less than 180 mg/dL (1-2 hours)      Critically ill patients:  140 - 180 mg/dL   Lab Results  Component Value Date   GLUCAP 267 (H) 04/04/2020   HGBA1C 8.5 (H) 04/01/2020    Review of Glycemic Control Results for [name], Jordan (MRN [id number]) as of 04/04/2020 07:08  Ref. Range 04/03/2020 07:57 04/03/2020 11:38 04/03/2020 15:50 04/03/2020 21:24 04/04/2020 06:16  Glucose-Capillary Latest Ref Range: 70 - 99 mg/dL 242 (H) 683 (H) 419 (H) 201 (H) 267 (H)   Diabetes history:type 2 Outpatient Diabetes medications:Levemir 27 units BID, Novolog 27 units BID, Metformin 1000 mg BID Current orders for Inpatient glycemic control:Lantus 27 units BID, Novolog MODERATE correction scale TID & HS scale, Novolog 7 units TID with meals  Inpatient Diabetes Program Recommendations:    Consider: 1-Lantus 32 units BID 2-Novolog 10 units TID with meals if eats at least 50% of meal  Will continue to follow while inpatient.  Thank you, Dulce Sellar, RN, BSN Diabetes Coordinator Inpatient Diabetes Program [phone] (team pager from 8a-5p)

## 2020-04-04 NOTE — Progress Notes (Signed)
Physical Therapy Treatment Patient Details Name: Jordan [name] MRN: [id number] DOB: Oct 27, 1940 Today's Date: 04/04/2020    History of Present Illness 80 y.o. 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    PT Comments    Pt more easily arousable today , though returns to sleep when not being actively stimulated. Assist to come to EOB and then wife assisted him with donning of L prosthesis. Mod A +2 to stand to stedy from bed and then min A +2 to stand from perched position. Pt performed multiple rounds of sit<>stand from this position as well as pregait activities. Pt seated in recliner after session. PT will continue to follow.    Follow Up Recommendations  CIR     Equipment Recommendations  None recommended by PT    Recommendations for Other Services Rehab consult     Precautions / Restrictions Precautions Precautions: Fall Required Braces or Orthoses: Other Brace Other Brace: L prosthetic Restrictions Weight Bearing Restrictions: No    Mobility  Bed Mobility Overal bed mobility: Needs Assistance Bed Mobility: Supine to Sit     Supine to sit: Mod assist     General bed mobility comments: pt able to initiate sup to sit, hand over hand guidance of RUE to L rail. Min A for LE's off front of bed, mod A for elevation of trunk into sitting. Min A to scoot hips to EOB  Transfers Overall transfer level: Needs assistance Equipment used: Ambulation equipment used Transfers: Sit to/from [employer] Sit to Stand: Min assist;+2 safety/equipment;+2 physical  assistance;Mod assist Stand pivot transfers: Total assist       General transfer comment: stedy used today since pt was unable to stand yesterday. Mod A +2 for power up from bed. Min A for sit>stand from flaps of stedy 3x.  Ambulation/Gait                 Stairs             Wheelchair Mobility    Modified Rankin (Stroke Patients Only) Modified Rankin (Stroke Patients Only) Pre-Morbid Rankin Score: Moderate disability Modified Rankin: Moderately severe disability     Balance Overall balance assessment: Needs assistance Sitting-balance support: Bilateral upper extremity supported;Feet supported Sitting balance-Leahy Scale: Poor Sitting balance - Comments: min A needed in sitting, progressed to close guarding. Pt's wife assisted pt in donning L prosthesis with pt in sitting Postural control: Right lateral lean Standing balance support: Bilateral upper extremity supported;During functional activity Standing balance-Leahy Scale: Poor Standing balance comment: UE support and external assist needed to stand               High Level Balance Comments: worked on maintaining midline in perched position            Cognition Arousal/Alertness: Lethargic Behavior During Therapy: Flat affect Overall Cognitive Status: Impaired/Different from baseline Area of Impairment: Problem solving                             Problem Solving: Slow processing General Comments: pt alert when stimulated, falls asleep as soon as he is not stimulated  Exercises General Exercises - Lower Extremity Ankle Circles/Pumps: AROM;Both;10 reps;Seated Quad Sets: AROM;Both;10 reps;Seated    General Comments General comments (skin integrity, edema, etc.): VSS. Pt expresses appreciation to be up and out of bed. Falling back asleep as soon as seated in recliner though      Pertinent Vitals/Pain Pain Assessment: No/denies pain    Home Living                       Prior Function            PT Goals (current goals can now be found in the care plan section) Acute Rehab PT Goals Patient Stated Goal: mobilize PT Goal Formulation: With patient/family Time For Goal Achievement: 04/15/20 Potential to Achieve Goals: Good Progress towards PT goals: Progressing toward goals    Frequency    Min 4X/week      PT Plan Current plan remains appropriate    Co-evaluation              AM-PAC PT "6 Clicks" Mobility   Outcome Measure  Help needed turning from your back to your side while in a flat bed without using bedrails?: A Lot Help needed moving from lying on your back to sitting on the side of a flat bed without using bedrails?: A Lot Help needed moving to and from a bed to a chair (including a wheelchair)?: A Lot Help needed standing up from a chair using your arms (e.g., wheelchair or bedside chair)?: A Lot Help needed to walk in hospital room?: A Lot Help needed climbing 3-5 steps with a railing? : Total 6 Click Score: 11    End of Session Equipment Utilized During Treatment: Gait belt Activity Tolerance: Patient tolerated treatment well Patient left: with family/visitor present;in chair;with chair alarm set;with call bell/phone within reach Nurse Communication: Mobility status PT Visit Diagnosis: Other abnormalities of gait and mobility (R26.89);Unsteadiness on feet (R26.81);Muscle weakness (generalized) (M62.81)     Time: 7062-3762 PT Time Calculation (min) (ACUTE ONLY): 24 min  Charges:  $Gait Training: 8-22 mins $Therapeutic Activity: 8-22 mins                     Lyanne Co, PT  Acute Rehab Services  Pager [phone] Office [phone]    Lawana Chambers Darreld Hoffer 04/04/2020, 2:16 PM

## 2020-04-05 DIAGNOSIS — E1122 Type 2 diabetes mellitus with diabetic chronic kidney disease: Secondary | ICD-10-CM | POA: Diagnosis not present

## 2020-04-05 DIAGNOSIS — E7800 Pure hypercholesterolemia, unspecified: Secondary | ICD-10-CM | POA: Diagnosis not present

## 2020-04-05 DIAGNOSIS — I5032 Chronic diastolic (congestive) heart failure: Secondary | ICD-10-CM | POA: Diagnosis not present

## 2020-04-05 DIAGNOSIS — I10 Essential (primary) hypertension: Secondary | ICD-10-CM | POA: Diagnosis not present

## 2020-04-05 LAB — BASIC METABOLIC PANEL
Anion gap: 12 (ref 5–15)
BUN: 20 mg/dL (ref 8–23)
CO2: 26 mmol/L (ref 22–32)
Calcium: 9 mg/dL (ref 8.9–10.3)
Chloride: 99 mmol/L (ref 98–111)
Creatinine, Ser: 1.38 mg/dL — ABNORMAL HIGH (ref 0.61–1.24)
GFR, Estimated: 52 mL/min — ABNORMAL LOW (ref >60)
Glucose, Bld: 208 mg/dL — ABNORMAL HIGH (ref 70–99)
Potassium: 3.9 mmol/L (ref 3.5–5.1)
Sodium: 137 mmol/L (ref 135–145)

## 2020-04-05 LAB — GLUCOSE, CAPILLARY
Glucose-Capillary: 232 mg/dL — ABNORMAL HIGH (ref 70–99)
Glucose-Capillary: 245 mg/dL — ABNORMAL HIGH (ref 70–99)
Glucose-Capillary: 262 mg/dL — ABNORMAL HIGH (ref 70–99)
Glucose-Capillary: 188 mg/dL — ABNORMAL HIGH (ref 70–99)

## 2020-04-05 MED ORDER — INSULIN ASPART 100 UNIT/ML ~~LOC~~ SOLN
12.0000 [IU] | Freq: Three times a day (TID) | SUBCUTANEOUS | Status: DC
  Administered 2020-04-05 – 2020-04-06 (×5): 12 [IU] via SUBCUTANEOUS

## 2020-04-05 MED ORDER — SODIUM CHLORIDE 0.9 % IV BOLUS
1000.0000 mL | Freq: Once | INTRAVENOUS | Status: AC
  Administered 2020-04-05: 1000 mL via INTRAVENOUS

## 2020-04-05 MED ORDER — INSULIN GLARGINE 100 UNIT/ML ~~LOC~~ SOLN
40.0000 [IU] | Freq: Two times a day (BID) | SUBCUTANEOUS | Status: DC
  Administered 2020-04-05 – 2020-04-06 (×3): 40 [IU] via SUBCUTANEOUS
  Filled 2020-04-05 (×4): qty 0.4

## 2020-04-05 NOTE — Progress Notes (Signed)
PROGRESS NOTE    Jordan [name]  YPP:[id number] DOB: 08-Mar-1941 DOA: 03/30/2020 PCP: [name], MD    Brief Narrative:  Jordan [name] is a 80 year old male with past medical history significant for CAD, aortic stenosis, chronic diastolic congestive heart failure, type 2 diabetes mellitus, essential hypertension, hyperlipidemia, CKD stage IIIb, carotid artery stenosis who presented to the ED via EMS for evaluation of left-sided facial droop and hyperglycemia.  Jordan [name] spouse reports for the past 3 days Jordan [name] has had slurred speech, left-sided facial droop, dysphagia with multiple falls and urinary incontinence.  Blood sugars have been elevated for the past week.  Denies history of prior stroke.  Fully vaccinated gets Covid-19.  Was reportedly seen at [facility] day prior for the symptoms and discharged home.  In the ED, vital signs stable.  WBC 6.2, hemoglobin 12.7, platelet 223, sodium 138, potassium 4.1, chloride 104, bicarb 23, BUN 15, creatinine 1.4, glucose 313, INR 1.1.  SARS-CoV-2 PCR and influenza a/B PCR negative.  BNP within normal limits.  High-sensitivity troponin XX 1.  B12 163.  Chest x-ray with no acute cardiopulmonary disease process.  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  Neurology was consulted.  Hospital service consulted for further evaluation and management of acute stroke.   Assessment & Plan:   Principal Problem:   Stroke [facility]) Active Problems:   Hyperlipidemia   Essential hypertension   Type 2 diabetes mellitus with diabetic chronic kidney disease (HCC)   Chronic diastolic heart failure (HCC)   Acute ischemic CVA Jordan [name] presenting to the ED with 3-day history of multiple falls, slurred speech, left-sided facial droop and dysphagia.  MR brain showing small left gangliothalamic acute infarct without hemorrhage or mass-effect.  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  TTE with LVEF 60 to 65%, no regional wall motion abnormalities, grade 1 diastolic dysfunction, LA mildly dilated, moderate aortic valve sclerosis, IVC normal in size.  Total cholesterol 165, LDL 66, triglycerides 374, HDL 24.  Hemoglobin A1c 8.5.  Neurology was consulted and followed during hospital course. --PT: CIR --OT: CIR --SLP: Solids, nectar thick liquids, home meds with liquid, aspiration precautions --Continue DAPT with aspirin 81 mg p.o. daily, Plavix 75 mg p.o. daily x3 weeks followed by Plavix alone  Dysphagia/Mild dysarthria in the setting of acute CVA --Continue SLP efforts --Pending CIR admission  Acute renal failure Baseline creatinine 1.1 with a GFR greater than 60.  Presented with a creatinine of 1.43 with a GFR of 50, likely secondary to prerenal azotemia poor oral intake take in the days preceding hospitalization. --Cr 1.43>1.2>1.38 --Avoid nephrotoxins, renally dose all medications --will give 1L NS bolus today --BMP in the a.m.  Essential hypertension BP 151/77 --Metoprolol succinate 25 mg p.o. daily --Hydralazine 5mg  IV q6h prn SBP >175   Hyperlipidemia --Zetia 10 mg p.o. nightly --Crestor 40 mg p.o. daily  B12 deficiency B12 level 163. --Continue B12 supplementation  Chronic diastolic congestive heart failure TTE with LVEF 60 to 65% with grade 1 diastolic dysfunction. --Metoprolol succinate 25 mg p.o. daily  Type 2 diabetes mellitus Hemoglobin A1c 8.5, poorly controlled. --Diabetic educator following, appreciate assistance --Lantus 40 units BID --NovoLog 12 units TIDAC --ISS for further coverage --CBGs QAC/HS   DVT prophylaxis: Lovenox Code Status: Full code Family Communication: Updated Jordan [name] extensively at bedside  Disposition Plan:  Status is: Inpatient  Remains  inpatient appropriate because:Unsafe d/c plan and Inpatient level of care appropriate due to severity of illness   Dispo:  Jordan [name] From: Home  Planned Disposition: Inpatient Rehab  Expected discharge date: 04/05/2020  Medically stable for discharge: Yes    Consultants:   Neurology  Procedures:   TTE  Antimicrobials:   None   Subjective: Jordan [name] seen and examined bedside, resting comfortably.  No specific complaints this morning.  Performed peer-to-peer with [place] this morning, now approved for CIR; no beds available today.  Denies headache, no fever/chills/night sweats, no nausea/vomiting/diarrhea, no chest pain, no palpitations, no abdominal pain, no weakness, no fatigue, no paresthesias.  No acute events overnight per nursing staff.  Objective: Vitals:   04/05/20 0754 04/05/20 0800 04/05/20 1149 04/05/20 1149  BP: 137/66  117/63 117/63  Pulse: 99  88 88  Resp: 18 16 16 16   Temp: (!) 97.5 F (36.4 C)  98.5 F (36.9 C) 98.5 F (36.9 C)  TempSrc: Oral  Oral Oral  SpO2: 99% 95% 95% 95%  Weight:      Height:       No intake or output data in the 24 hours ending 04/05/20 1332 Filed Weights   04/01/20 1300  Weight: 104.3 kg    Examination:  General exam: Appears calm and comfortable  Respiratory system: Clear to auscultation. Respiratory effort normal.  Oxygen well on room air Cardiovascular system: S1 & S2 heard, RRR. No JVD, murmurs, rubs, gallops or clicks. No pedal edema. Gastrointestinal system: Abdomen is nondistended, soft and nontender. No organomegaly or masses felt. Normal bowel sounds heard. Central nervous system: Alert and oriented. No focal neurological deficits. Extremities: Symmetric 5 x 5 power.  Left BKA site noted Skin: No rashes, lesions or ulcers Psychiatry: Judgement and insight appear normal. Mood & affect appropriate.     Data Reviewed: I have personally reviewed following labs and imaging studies  CBC: Recent Labs  Lab  03/30/20 1528 04/01/20 0154  WBC 6.2 5.6  NEUTROABS 3.2  --   HGB 12.7* 12.8*  HCT 40.9 37.8*  MCV 90.1 85.7  PLT 223 790   Basic Metabolic Panel: Recent Labs  Lab 03/30/20 1528 04/01/20 0154 04/05/20 0320  NA 138 134* 137  K 4.1 4.5 3.9  CL 104 100 99  CO2 23 24 26   GLUCOSE 313* 279* 208*  BUN 15 15 20   CREATININE 1.43* 1.20 1.38*  CALCIUM 9.2 9.2 9.0   GFR: Estimated Creatinine Clearance: 51.6 mL/min (A) (by C-G formula based on SCr of 1.38 mg/dL (H)). Liver Function Tests: Recent Labs  Lab 03/30/20 1528  AST 37  ALT 45*  ALKPHOS 112  BILITOT 0.4  PROT 7.5  ALBUMIN 3.5   No results for input(s): LIPASE, AMYLASE in the last 168 hours. No results for input(s): AMMONIA in the last 168 hours. Coagulation Profile: Recent Labs  Lab 03/30/20 1528  INR 1.1   Cardiac Enzymes: No results for input(s): CKTOTAL, CKMB, CKMBINDEX, TROPONINI in the last 168 hours. BNP (last 3 results) No results for input(s): PROBNP in the last 8760 hours. HbA1C: No results for input(s): HGBA1C in the last 72 hours. CBG: Recent Labs  Lab 04/04/20 1111 04/04/20 1640 04/04/20 2123 04/05/20 0614 04/05/20 1145  GLUCAP 356* 301* 270* 232* 245*   Lipid Profile: No results for input(s): CHOL, HDL, LDLCALC, TRIG, CHOLHDL, LDLDIRECT in the last 72 hours. Thyroid Function Tests: No results for input(s): TSH, T4TOTAL, FREET4, T3FREE, THYROIDAB in the  last 72 hours. Anemia Panel: No results for input(s): VITAMINB12, FOLATE, FERRITIN, TIBC, IRON, RETICCTPCT in the last 72 hours. Sepsis Labs: No results for input(s): PROCALCITON, LATICACIDVEN in the last 168 hours.  Recent Results (from the past 240 hour(s))  Resp Panel by RT-PCR (Flu A&B, Covid) Nasopharyngeal Swab     Status: None   Collection Time: 03/31/20 12:27 AM   Specimen: Nasopharyngeal Swab; Nasopharyngeal(NP) swabs in vial transport medium  Result Value Ref Range Status   SARS Coronavirus 2 by RT PCR NEGATIVE NEGATIVE Final     Comment: (NOTE) SARS-CoV-2 target nucleic acids are NOT DETECTED.  The SARS-CoV-2 RNA is generally detectable in upper respiratory specimens during the acute phase of infection. The lowest concentration of SARS-CoV-2 viral copies this assay can detect is 138 copies/mL. A negative result does not preclude SARS-Cov-2 infection and should not be used as the sole basis for treatment or other Jordan [name] management decisions. A negative result may occur with  improper specimen collection/handling, submission of specimen other than nasopharyngeal swab, presence of viral mutation(s) within the areas targeted by this assay, and inadequate number of viral copies(<138 copies/mL). A negative result must be combined with clinical observations, Jordan [name] history, and epidemiological information. The expected result is Negative.  Fact Sheet for Patients:  BloggerCourse.com  Fact Sheet for Healthcare Providers:  SeriousBroker.it  This test is no t yet approved or cleared by the [place] FDA and  has been authorized for detection and/or diagnosis of SARS-CoV-2 by FDA under an Emergency Use Authorization (EUA). This EUA will remain  in effect (meaning this test can be used) for the duration of the COVID-19 declaration under Section 564(b)(1) of the Act, 21 U.S.C.section 360bbb-3(b)(1), unless the authorization is terminated  or revoked sooner.       Influenza A by PCR NEGATIVE NEGATIVE Final   Influenza B by PCR NEGATIVE NEGATIVE Final    Comment: (NOTE) The Xpert Xpress SARS-CoV-2/FLU/RSV plus assay is intended as an aid in the diagnosis of influenza from Nasopharyngeal swab specimens and should not be used as a sole basis for treatment. Nasal washings and aspirates are unacceptable for Xpert Xpress SARS-CoV-2/FLU/RSV testing.  Fact Sheet for Patients: BloggerCourse.com  Fact Sheet for Healthcare  Providers: SeriousBroker.it  This test is not yet approved or cleared by the [place] FDA and has been authorized for detection and/or diagnosis of SARS-CoV-2 by FDA under an Emergency Use Authorization (EUA). This EUA will remain in effect (meaning this test can be used) for the duration of the COVID-19 declaration under Section 564(b)(1) of the Act, 21 U.S.C. section 360bbb-3(b)(1), unless the authorization is terminated or revoked.  Performed at [facility], [address], [place], [place] [address]          Radiology Studies: No results found.      Scheduled Meds: . aspirin EC  81 mg Oral Daily  . Chlorhexidine Gluconate Cloth  6 each Topical Daily  . clopidogrel  75 mg Oral Daily  . enoxaparin (LOVENOX) injection  40 mg Subcutaneous Daily  . ezetimibe  10 mg Oral QHS  . insulin aspart  0-20 Units Subcutaneous TID WC  . insulin aspart  0-5 Units Subcutaneous QHS  . insulin aspart  12 Units Subcutaneous TID WC  . insulin glargine  40 Units Subcutaneous BID  . metoprolol succinate  25 mg Oral Daily  . rosuvastatin  40 mg Oral Daily  . senna-docusate  2 tablet Oral BID  . vitamin B-12  1,000  mcg Oral Daily   Continuous Infusions:   LOS: 5 days    Time spent: 38 minutes spent on chart review, discussion with nursing staff, consultants, updating family and interview/physical exam; more than 50% of that time was spent in counseling and/or coordination of care.    Alvira Philips [place], DO [facility] Available via Epic secure chat 7am-7pm After these hours, please refer to coverage provider listed on [email or web address] 04/05/2020, 1:32 PM

## 2020-04-05 NOTE — Progress Notes (Signed)
Inpatient Rehab Admissions Coordinator:   Peer to peer was completed and [employer] approved Pt.'s CIR stay. I have notified family of the decision. I do not have a CIR bed for Pt. Today. Will continue to follow for potential admit pending bed availability.   Megan Salon, MS, CCC-SLP Rehab Admissions Coordinator  [phone] (celll) [phone] (office)

## 2020-04-05 NOTE — Progress Notes (Signed)
  Speech Language Pathology Treatment: Cognitive-Linquistic  Patient Details Name: Jordan [name] MRN: [id number] DOB: March 13, 1941 Today's Date: 04/05/2020 Time: 8242-3536 SLP Time Calculation (min) (ACUTE ONLY): 25 min  Assessment / Plan / Recommendation Clinical Impression  Patient seen at bedside with wife present in room. Patient reported that he had just finished lunch but didnt eat much because he did not care for the food. He then said, "I can make it until dinner". Patient exhibits a mild dysarthria but a mild-mod voice disorder secondary to poor breath support, poor coordination of respiration with phonation. Voice is low in intensity and mildly hoarse. Patient able to sustain vowel "ahh" for average of 5.5 seconds on one breath. SLP demonstrated and directed patient to complete diaphragmatic breathing exercise. He was able to approximate but continues to use diaphragm and accessory muscles for breathing. Voice and speech intelligiblity did improve slightly when patient's HOB elevated. Patient and wife educated on importance of continuing to practice slow, controlled breathing, diaphragmatic breathing and to pause and breath more frequently when talking. Patient stated he was approved to go to CIR; awaiting bed availability.   HPI HPI: Jordan [name] is a 80 y.o. 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 Sx included weakness w/ multiple falls, slurred speech, difficulty swallowing, L facial droop. MRI shows small left gangliothalamic infarct.      SLP Plan  Continue with current plan of care       Recommendations   CIR                Follow up Recommendations:  Inpatient Rehab SLP Visit Diagnosis: Dysarthria and anarthria (R47.1) Plan: Continue with current plan of care       Angela Nevin, MA, CCC-SLP 04/05/20 2:51 PM

## 2020-04-05 NOTE — Progress Notes (Signed)
Inpatient Rehab Admissions Coordinator:   Spoke with Pt.'s wife over the phone to update her. Peer to peer is scheduled with [employer] today. Discussed what she would like to do in the event that [facility] denies CIR stay. She is not interested in SNF at this time and prefers HH. I do have some concerns that she would not be able to provide the amount of physical assist needed (Pt. Currently mod-total A with mobility and transfers and has not attempted gait) if he were discharged with [facility] today. Discussed the option of sending info out to [place] since they have a contract with the [place] and Pt. Could use his VA insurance. She states that she wants to think this over first.   Megan Salon, MS, CCC-SLP Rehab Admissions Coordinator  [phone] (celll) [phone] (office)

## 2020-04-05 NOTE — Progress Notes (Signed)
Occupational Therapy Treatment Patient Details Name: Josua Ferrebee MRN: [id number] DOB: [birth year] Today's Date: 04/05/2020    History of present illness 80 y.o. 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   OT comments  Pt more alert this session, upon arrival pt sitting upright in bed eating lunch. Pt demonstrated decreased fine motor coordination, decreased proprioception, and decreased in-hand manipulation skills while eating. He required increased time to eat and scoop food. Pt declined further mobility at this time, requesting to eat. Pt oriented x4 reports no changes in vision, did not note any visual limitations functionally this session. Pt will continue to benefit from skilled OT services to maximize safety and independence with ADL/IADL and functional mobility. Will continue to follow acutely and progress as tolerated.    Follow Up Recommendations  CIR    Equipment Recommendations  3 in 1 bedside commode    Recommendations for Other Services Rehab consult    Precautions / Restrictions Precautions Precautions: Fall Precaution Comments: L prosthetic Required Braces or Orthoses: Other Brace Other Brace: L prosthetic Restrictions Weight Bearing Restrictions: No       Mobility Bed Mobility               General bed mobility comments: pt seated upright in bed  Transfers                      Balance                                           ADL either performed or assessed with clinical judgement   ADL Overall ADL's : Needs  assistance/impaired Eating/Feeding: Set up;Sitting;Bed level Eating/Feeding Details (indicate cue type and reason): pt eating lunch from tray after setup assistance;pt demonstrated fine motor limitations in RUE, required increased effort and time to scoop food.                                   General ADL Comments: pt declined mobility at this time requesting to finish his lunch     Vision       Perception     Praxis      Cognition Arousal/Alertness: Lethargic Behavior During Therapy: Flat affect Overall Cognitive Status: Impaired/Different from baseline Area of Impairment: Problem solving                             Problem Solving: Slow processing General Comments: pt with slow response times this session, more alert this session, awake and eating his lunch. Pt oriented x4        Exercises     Shoulder Instructions       General Comments vss    Pertinent Vitals/ Pain       Pain Assessment: No/denies pain  Home Living  Prior Functioning/Environment              Frequency  Min 2X/week        Progress Toward Goals  OT Goals(current goals can now be found in the care plan section)  Progress towards OT goals: Progressing toward goals  Acute Rehab OT Goals Patient Stated Goal: mobilize OT Goal Formulation: With family Time For Goal Achievement: 04/15/20 Potential to Achieve Goals: Good ADL Goals Pt Will Perform Grooming: with set-up;sitting Pt Will Transfer to Toilet: with min assist;bedside commode;stand pivot transfer Additional ADL Goal #1: Pt will complete bed mobility supervision as precursor to adls Additional ADL Goal #2: Pt will don doff L LE prosthetic min (A) as precursor to adls.  Plan Discharge plan remains appropriate;Frequency remains appropriate    Co-evaluation                 AM-PAC OT "6 Clicks" Daily Activity     Outcome Measure    Help from another person eating meals?: A Little Help from another person taking care of personal grooming?: A Little Help from another person toileting, which includes using toliet, bedpan, or urinal?: A Lot Help from another person bathing (including washing, rinsing, drying)?: A Lot Help from another person to put on and taking off regular upper body clothing?: A Lot Help from another person to put on and taking off regular lower body clothing?: A Lot 6 Click Score: 14    End of Session    OT Visit Diagnosis: Unsteadiness on feet (R26.81);Muscle weakness (generalized) (M62.81);Hemiplegia and hemiparesis Hemiplegia - Right/Left: Right Hemiplegia - dominant/non-dominant: Dominant Hemiplegia - caused by: Cerebral infarction   Activity Tolerance Patient tolerated treatment well   Patient Left in bed;with call bell/phone within reach;with bed alarm set;with family/visitor present;Other (comment) (bed in chair position)   Nurse Communication Mobility status        Time: 0017-4944 OT Time Calculation (min): 16 min  Charges: OT General Charges $OT Visit: 1 Visit OT Treatments $Self Care/Home Management : 8-22 mins  Rosey Bath OTR/L Acute Rehabilitation Services Office: [phone]    Rebeca Alert 04/05/2020, 4:33 PM

## 2020-04-06 DIAGNOSIS — I5032 Chronic diastolic (congestive) heart failure: Secondary | ICD-10-CM | POA: Diagnosis not present

## 2020-04-06 DIAGNOSIS — E1122 Type 2 diabetes mellitus with diabetic chronic kidney disease: Secondary | ICD-10-CM | POA: Diagnosis not present

## 2020-04-06 DIAGNOSIS — I10 Essential (primary) hypertension: Secondary | ICD-10-CM | POA: Diagnosis not present

## 2020-04-06 DIAGNOSIS — E7800 Pure hypercholesterolemia, unspecified: Secondary | ICD-10-CM | POA: Diagnosis not present

## 2020-04-06 LAB — BASIC METABOLIC PANEL
Anion gap: 10 (ref 5–15)
BUN: 15 mg/dL (ref 8–23)
CO2: 26 mmol/L (ref 22–32)
Calcium: 8.5 mg/dL — ABNORMAL LOW (ref 8.9–10.3)
Chloride: 102 mmol/L (ref 98–111)
Creatinine, Ser: 1.25 mg/dL — ABNORMAL HIGH (ref 0.61–1.24)
GFR, Estimated: 59 mL/min — ABNORMAL LOW (ref >60)
Glucose, Bld: 108 mg/dL — ABNORMAL HIGH (ref 70–99)
Potassium: 3.7 mmol/L (ref 3.5–5.1)
Sodium: 138 mmol/L (ref 135–145)

## 2020-04-06 LAB — GLUCOSE, CAPILLARY
Glucose-Capillary: 97 mg/dL (ref 70–99)
Glucose-Capillary: 198 mg/dL — ABNORMAL HIGH (ref 70–99)
Glucose-Capillary: 147 mg/dL — ABNORMAL HIGH (ref 70–99)
Glucose-Capillary: 183 mg/dL — ABNORMAL HIGH (ref 70–99)

## 2020-04-06 MED ORDER — INSULIN GLARGINE 100 UNIT/ML ~~LOC~~ SOLN
44.0000 [IU] | Freq: Two times a day (BID) | SUBCUTANEOUS | Status: DC
  Administered 2020-04-06: 44 [IU] via SUBCUTANEOUS
  Filled 2020-04-06 (×3): qty 0.44

## 2020-04-06 MED ORDER — INSULIN ASPART 100 UNIT/ML ~~LOC~~ SOLN
14.0000 [IU] | Freq: Three times a day (TID) | SUBCUTANEOUS | Status: DC
  Administered 2020-04-06 – 2020-04-07 (×2): 14 [IU] via SUBCUTANEOUS

## 2020-04-06 NOTE — Plan of Care (Signed)
  Problem: Education: Goal: Knowledge of General Education information will improve Description Including pain rating scale, medication(s)/side effects and non-pharmacologic comfort measures Outcome: Progressing   Problem: Health Behavior/Discharge Planning: Goal: Ability to manage health-related needs will improve Outcome: Progressing   Problem: Clinical Measurements: Goal: Ability to maintain clinical measurements within normal limits will improve Outcome: Progressing Goal: Will remain free from infection Outcome: Progressing Goal: Diagnostic test results will improve Outcome: Progressing Goal: Respiratory complications will improve Outcome: Progressing Goal: Cardiovascular complication will be avoided Outcome: Progressing   Problem: Activity: Goal: Risk for activity intolerance will decrease Outcome: Progressing   Problem: Nutrition: Goal: Adequate nutrition will be maintained Outcome: Progressing   Problem: Coping: Goal: Level of anxiety will decrease Outcome: Progressing   Problem: Elimination: Goal: Will not experience complications related to bowel motility Outcome: Progressing Goal: Will not experience complications related to urinary retention Outcome: Progressing   Problem: Pain Managment: Goal: General experience of comfort will improve Outcome: Progressing   Problem: Safety: Goal: Ability to remain free from injury will improve Outcome: Progressing   Problem: Skin Integrity: Goal: Risk for impaired skin integrity will decrease Outcome: Progressing   Problem: Education: Goal: Knowledge of disease or condition will improve Outcome: Progressing Goal: Knowledge of secondary prevention will improve Outcome: Progressing Goal: Knowledge of patient specific risk factors addressed and post discharge goals established will improve Outcome: Progressing Goal: Individualized Educational Video(s) Outcome: Progressing   Problem: Ischemic Stroke/TIA Tissue  Perfusion: Goal: Complications of ischemic stroke/TIA will be minimized Outcome: Progressing   

## 2020-04-06 NOTE — Progress Notes (Signed)
Inpatient Rehabilitation Admissions Coordinator  I met at bedside with patient and his wife. I do not have a CIR for this patient today. I will follow up tomorrow.  Danne Baxter, RN, MSN Rehab Admissions Coordinator [phone] 04/06/2020 12:11 PM

## 2020-04-06 NOTE — Progress Notes (Signed)
PROGRESS NOTE    Jordan [name]  YCX:[id number] DOB: [birth year] DOA: 03/30/2020 PCP: [name], MD    Brief Narrative:  Jordan [name] is a 80 year old male with past medical history significant for CAD, aortic stenosis, chronic diastolic congestive heart failure, type 2 diabetes mellitus, essential hypertension, hyperlipidemia, CKD stage IIIb, carotid artery stenosis who presented to the ED via EMS for evaluation of left-sided facial droop and hyperglycemia.  Patient spouse reports for the past 3 days patient has had slurred speech, left-sided facial droop, dysphagia with multiple falls and urinary incontinence.  Blood sugars have been elevated for the past week.  Denies history of prior stroke.  Fully vaccinated gets Covid-19.  Was reportedly seen at [facility] day prior for the symptoms and discharged home.  In the ED, vital signs stable.  WBC 6.2, hemoglobin 12.7, platelet 223, sodium 138, potassium 4.1, chloride 104, bicarb 23, BUN 15, creatinine 1.4, glucose 313, INR 1.1.  SARS-CoV-2 PCR and influenza a/B PCR negative.  BNP within normal limits.  High-sensitivity troponin XX 1.  B12 163.  Chest x-ray with no acute cardiopulmonary disease process.  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  Neurology was consulted.  Hospital service consulted for further evaluation and management of acute stroke.   Assessment & Plan:   Principal Problem:   Stroke [facility]) Active Problems:   Hyperlipidemia   Essential hypertension   Type 2 diabetes mellitus with diabetic chronic kidney disease (HCC)   Chronic diastolic heart failure (HCC)   Acute ischemic CVA Patient presenting to the ED with 3-day history of multiple falls, slurred speech, left-sided facial droop and dysphagia.  MR brain showing small left gangliothalamic acute infarct without hemorrhage or mass-effect.  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  TTE with LVEF 60 to 65%, no regional wall motion abnormalities, grade 1 diastolic dysfunction, LA mildly dilated, moderate aortic valve sclerosis, IVC normal in size.  Total cholesterol 165, LDL 66, triglycerides 374, HDL 24.  Hemoglobin A1c 8.5.  Neurology was consulted and followed during hospital course. --PT: CIR --OT: CIR --SLP: Solids, nectar thick liquids, home meds with liquid, aspiration precautions --Continue DAPT with aspirin 81 mg p.o. daily, Plavix 75 mg p.o. daily x3 weeks followed by Plavix alone  Dysphagia/Mild dysarthria in the setting of acute CVA --Continue SLP efforts --Pending CIR admission  Acute renal failure Baseline creatinine 1.1 with a GFR greater than 60.  Presented with a creatinine of 1.43 with a GFR of 50, likely secondary to prerenal azotemia poor oral intake take in the days preceding hospitalization. --Cr 1.43>1.2>1.38>1.25 --Avoid nephrotoxins, renally dose all medications --BMP in the a.m.  Essential hypertension BP 151/77 --Metoprolol succinate 25 mg p.o. daily --Hydralazine 5mg  IV q6h prn SBP >175   Hyperlipidemia --Zetia 10 mg p.o. nightly --Crestor 40 mg p.o. daily  B12 deficiency B12 level 163. --Continue B12 supplementation  Chronic diastolic congestive heart failure TTE with LVEF 60 to 65% with grade 1 diastolic dysfunction. --Metoprolol succinate 25 mg p.o. daily  Type 2 diabetes mellitus Hemoglobin A1c 8.5, poorly controlled. --Diabetic educator following, appreciate assistance --Lantus 40 units BID --NovoLog 12 units TIDAC --ISS for further coverage --CBGs QAC/HS   DVT prophylaxis: Lovenox Code Status: Full code Family Communication: Updated patient extensively at bedside  Disposition Plan:  Status is: Inpatient  Remains  inpatient appropriate  because:Unsafe d/c plan and Inpatient level of care appropriate due to severity of illness   Dispo:  Patient From: Home  Planned Disposition: Inpatient Rehab, awaiting bed  Expected discharge date: 04/07/2020  Medically stable for discharge: Yes    Consultants:   Neurology  Procedures:   TTE  Antimicrobials:   None   Subjective: Patient seen and examined bedside, resting comfortably.  Spouse present at bedside.  No specific complaints this morning.  Awaiting CIR bed, none available today.  Denies headache, no fever/chills/night sweats, no nausea/vomiting/diarrhea, no chest pain, no palpitations, no abdominal pain, no weakness, no fatigue, no paresthesias.  No acute events overnight per nursing staff.  Objective: Vitals:   04/05/20 2321 04/06/20 0412 04/06/20 0832 04/06/20 1216  BP: (!) 144/86 (!) 147/83 136/66 123/71  Pulse:  82 87 75  Resp: 20 20 17 20   Temp: 99.2 F (37.3 C) 99.1 F (37.3 C) 98.4 F (36.9 C) 97.6 F (36.4 C)  TempSrc: Oral Axillary Oral Oral  SpO2:  98% 100% 97%  Weight:      Height:        Intake/Output Summary (Last 24 hours) at 04/06/2020 1435 Last data filed at 04/06/2020 0833 Gross per 24 hour  Intake 1358 ml  Output --  Net 1358 ml   Filed Weights   04/01/20 1300  Weight: 104.3 kg    Examination:  General exam: Appears calm and comfortable  Respiratory system: Clear to auscultation. Respiratory effort normal.  Oxygen well on room air Cardiovascular system: S1 & S2 heard, RRR. No JVD, murmurs, rubs, gallops or clicks. No pedal edema. Gastrointestinal system: Abdomen is nondistended, soft and nontender. No organomegaly or masses felt. Normal bowel sounds heard. Central nervous system: Alert and oriented. No focal neurological deficits. Extremities: Symmetric 5 x 5 power.  Left BKA site noted Skin: No rashes, lesions or ulcers Psychiatry: Judgement and insight appear normal. Mood & affect appropriate.     Data Reviewed: I have  personally reviewed following labs and imaging studies  CBC: Recent Labs  Lab 03/30/20 1528 04/01/20 0154  WBC 6.2 5.6  NEUTROABS 3.2  --   HGB 12.7* 12.8*  HCT 40.9 37.8*  MCV 90.1 85.7  PLT 223 235   Basic Metabolic Panel: Recent Labs  Lab 03/30/20 1528 04/01/20 0154 04/05/20 0320 04/06/20 0305  NA 138 134* 137 138  K 4.1 4.5 3.9 3.7  CL 104 100 99 102  CO2 23 24 26 26   GLUCOSE 313* 279* 208* 108*  BUN 15 15 20 15   CREATININE 1.43* 1.20 1.38* 1.25*  CALCIUM 9.2 9.2 9.0 8.5*   GFR: Estimated Creatinine Clearance: 57 mL/min (A) (by C-G formula based on SCr of 1.25 mg/dL (H)). Liver Function Tests: Recent Labs  Lab 03/30/20 1528  AST 37  ALT 45*  ALKPHOS 112  BILITOT 0.4  PROT 7.5  ALBUMIN 3.5   No results for input(s): LIPASE, AMYLASE in the last 168 hours. No results for input(s): AMMONIA in the last 168 hours. Coagulation Profile: Recent Labs  Lab 03/30/20 1528  INR 1.1   Cardiac Enzymes: No results for input(s): CKTOTAL, CKMB, CKMBINDEX, TROPONINI in the last 168 hours. BNP (last 3 results) No results for input(s): PROBNP in the last 8760 hours. HbA1C: No results for input(s): HGBA1C in the last 72 hours. CBG: Recent Labs  Lab 04/05/20 1145 04/05/20 1626 04/05/20 2107 04/06/20 0652 04/06/20 1214  GLUCAP 245* 262* 188* 97 198*   Lipid Profile: No results  for input(s): CHOL, HDL, LDLCALC, TRIG, CHOLHDL, LDLDIRECT in the last 72 hours. Thyroid Function Tests: No results for input(s): TSH, T4TOTAL, FREET4, T3FREE, THYROIDAB in the last 72 hours. Anemia Panel: No results for input(s): VITAMINB12, FOLATE, FERRITIN, TIBC, IRON, RETICCTPCT in the last 72 hours. Sepsis Labs: No results for input(s): PROCALCITON, LATICACIDVEN in the last 168 hours.  Recent Results (from the past 240 hour(s))  Resp Panel by RT-PCR (Flu A&B, Covid) Nasopharyngeal Swab     Status: None   Collection Time: 03/31/20 12:27 AM   Specimen: Nasopharyngeal Swab;  Nasopharyngeal(NP) swabs in vial transport medium  Result Value Ref Range Status   SARS Coronavirus 2 by RT PCR NEGATIVE NEGATIVE Final    Comment: (NOTE) SARS-CoV-2 target nucleic acids are NOT DETECTED.  The SARS-CoV-2 RNA is generally detectable in upper respiratory specimens during the acute phase of infection. The lowest concentration of SARS-CoV-2 viral copies this assay can detect is 138 copies/mL. A negative result does not preclude SARS-Cov-2 infection and should not be used as the sole basis for treatment or other patient management decisions. A negative result may occur with  improper specimen collection/handling, submission of specimen other than nasopharyngeal swab, presence of viral mutation(s) within the areas targeted by this assay, and inadequate number of viral copies(<138 copies/mL). A negative result must be combined with clinical observations, patient history, and epidemiological information. The expected result is Negative.  Fact Sheet for Patients:  EntrepreneurPulse.com.au  Fact Sheet for Healthcare Providers:  IncredibleEmployment.be  This test is no t yet approved or cleared by the [place] FDA and  has been authorized for detection and/or diagnosis of SARS-CoV-2 by FDA under an Emergency Use Authorization (EUA). This EUA will remain  in effect (meaning this test can be used) for the duration of the COVID-19 declaration under Section 564(b)(1) of the Act, 21 U.S.C.section 360bbb-3(b)(1), unless the authorization is terminated  or revoked sooner.       Influenza A by PCR NEGATIVE NEGATIVE Final   Influenza B by PCR NEGATIVE NEGATIVE Final    Comment: (NOTE) The Xpert Xpress SARS-CoV-2/FLU/RSV plus assay is intended as an aid in the diagnosis of influenza from Nasopharyngeal swab specimens and should not be used as a sole basis for treatment. Nasal washings and aspirates are unacceptable for Xpert Xpress  SARS-CoV-2/FLU/RSV testing.  Fact Sheet for Patients: EntrepreneurPulse.com.au  Fact Sheet for Healthcare Providers: IncredibleEmployment.be  This test is not yet approved or cleared by the [place] FDA and has been authorized for detection and/or diagnosis of SARS-CoV-2 by FDA under an Emergency Use Authorization (EUA). This EUA will remain in effect (meaning this test can be used) for the duration of the COVID-19 declaration under Section 564(b)(1) of the Act, 21 U.S.C. section 360bbb-3(b)(1), unless the authorization is terminated or revoked.  Performed at [facility], [place] [address], [place], [place] [address]          Radiology Studies: No results found.      Scheduled Meds: . aspirin EC  81 mg Oral Daily  . Chlorhexidine Gluconate Cloth  6 each Topical Daily  . clopidogrel  75 mg Oral Daily  . enoxaparin (LOVENOX) injection  40 mg Subcutaneous Daily  . ezetimibe  10 mg Oral QHS  . insulin aspart  0-20 Units Subcutaneous TID WC  . insulin aspart  0-5 Units Subcutaneous QHS  . insulin aspart  12 Units Subcutaneous TID WC  . insulin glargine  40 Units Subcutaneous BID  . metoprolol succinate  25 mg Oral Daily  . rosuvastatin  40 mg Oral Daily  . senna-docusate  2 tablet Oral BID  . vitamin B-12  1,000 mcg Oral Daily   Continuous Infusions:   LOS: 6 days    Time spent: 38 minutes spent on chart review, discussion with nursing staff, consultants, updating family and interview/physical exam; more than 50% of that time was spent in counseling and/or coordination of care.    Alvira Philips [place], DO [facility] Available via Epic secure chat 7am-7pm After these hours, please refer to coverage provider listed on [email or web address] 04/06/2020, 2:35 PM

## 2020-04-06 NOTE — Progress Notes (Signed)
Physical Therapy Treatment Patient Details Name: Jordan [name] MRN: [id number] DOB: Aug 19, 1940 Today's Date: 04/06/2020    History of Present Illness 80 y.o. 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    PT Comments    Pt tolerates treatment well with improved transfer quality this session. Pt with some motor planning deficits and requires cues for transfer sequencing and to assist with scooting forward in recliner. Pt demonstrates a R sided lean which he is aware of but has difficulty correcting at this time. Pt will benefit from continued aggressive mobilization and PT POC to reduce falls risk and to aide in a return to a supervision level of mobility. PT continues to recommend CIR placement at this time.   Follow Up Recommendations  CIR     Equipment Recommendations  None recommended by PT    Recommendations for Other Services       Precautions / Restrictions Precautions Precautions: Fall Precaution Comments: L prosthetic Required Braces or Orthoses: Other Brace Other Brace: L prosthetic Restrictions Weight Bearing Restrictions: No    Mobility  Bed Mobility Overal bed mobility: Needs Assistance Bed Mobility: Supine to Sit     Supine to sit: HOB elevated;Mod assist     General bed mobility comments: modA to elevate trunk into sitting  Transfers Overall transfer level: Needs assistance Equipment used: Rolling walker (2 wheeled);1 person hand held assist Transfers: Sit to/from [employer] Sit to Stand: Mod assist;From elevated surface Stand pivot  transfers: Mod assist;Min assist       General transfer comment: modA for sit to stand transfers, pt requires cues for hand placement and sequencing. MinA for step and turn transfers with RW initially, modA with fatigue  Ambulation/Gait Ambulation/Gait assistance: Min assist Gait Distance (Feet): 20 Feet Assistive device: Rolling walker (2 wheeled) Gait Pattern/deviations: Step-to pattern Gait velocity: reduced Gait velocity interpretation: <1.31 ft/sec, indicative of household ambulator General Gait Details: pt with short step-to gait, reduced foot clearance of RLE, increased trunk flexion   Stairs             Wheelchair Mobility    Modified Rankin (Stroke Patients Only) Modified Rankin (Stroke Patients Only) Pre-Morbid Rankin Score: Moderate disability Modified Rankin: Moderately severe disability     Balance Overall balance assessment: Needs assistance Sitting-balance support: Single extremity supported;Bilateral upper extremity supported;Feet supported Sitting balance-Leahy Scale: Poor Sitting balance - Comments: reliant on UE support of bed and minG   Standing balance support: Bilateral upper extremity supported Standing balance-Leahy Scale: Poor Standing balance comment: reliant on BUE support of RW and minG-minA for static standing                            Cognition Arousal/Alertness: Awake/alert Behavior During Therapy: Flat affect Overall Cognitive Status: Impaired/Different from baseline Area of Impairment: Attention;Memory;Following commands;Awareness;Safety/judgement;Problem solving                   Current Attention Level: Sustained Memory: Decreased short-term memory Following Commands: Follows one step commands with increased time Safety/Judgement: Decreased awareness of safety;Decreased awareness of deficits Awareness: Emergent Problem Solving: Slow processing;Requires verbal cues;Requires tactile  cues;Difficulty  sequencing        Exercises      General Comments General comments (skin integrity, edema, etc.): VSS on RA      Pertinent Vitals/Pain Pain Assessment: No/denies pain    Home Living                      Prior Function            PT Goals (current goals can now be found in the care plan section) Acute Rehab PT Goals Patient Stated Goal: mobilize Progress towards PT goals: Progressing toward goals    Frequency    Min 4X/week      PT Plan Current plan remains appropriate    Co-evaluation              AM-PAC PT "6 Clicks" Mobility   Outcome Measure  Help needed turning from your back to your side while in a flat bed without using bedrails?: A Little Help needed moving from lying on your back to sitting on the side of a flat bed without using bedrails?: A Lot Help needed moving to and from a bed to a chair (including a wheelchair)?: A Lot Help needed standing up from a chair using your arms (e.g., wheelchair or bedside chair)?: A Lot Help needed to walk in hospital room?: A Little Help needed climbing 3-5 steps with a railing? : Total 6 Click Score: 13    End of Session Equipment Utilized During Treatment: Gait belt Activity Tolerance: Patient tolerated treatment well Patient left: in chair;with call bell/phone within reach;with chair alarm set Nurse Communication: Mobility status;Need for lift equipment (STEDY) PT Visit Diagnosis: Other abnormalities of gait and mobility (R26.89);Unsteadiness on feet (R26.81);Muscle weakness (generalized) (M62.81)     Time: 1191-4782 PT Time Calculation (min) (ACUTE ONLY): 32 min  Charges:  $Gait Training: 8-22 mins $Therapeutic Activity: 8-22 mins                     Arlyss Gandy, PT, DPT Acute Rehabilitation Pager: [phone]    Arlyss Gandy 04/06/2020, 3:29 PM

## 2020-04-07 ENCOUNTER — Inpatient Hospital Stay (HOSPITAL_COMMUNITY)
Admission: RE | Admit: 2020-04-07 | Discharge: 2020-04-26 | DRG: 057 | Disposition: A | Discharge status: Home Health | Payer: Medicare PPO | Source: Intra-hospital | Attending: Physical Medicine & Rehabilitation | Admitting: Physical Medicine & Rehabilitation

## 2020-04-07 ENCOUNTER — Other Ambulatory Visit: Payer: Self-pay

## 2020-04-07 DIAGNOSIS — Z7982 Long term (current) use of aspirin: Secondary | ICD-10-CM

## 2020-04-07 DIAGNOSIS — I130 Hypertensive heart and chronic kidney disease with heart failure and stage 1 through stage 4 chronic kidney disease, or unspecified chronic kidney disease: Secondary | ICD-10-CM | POA: Diagnosis present

## 2020-04-07 DIAGNOSIS — I2510 Atherosclerotic heart disease of native coronary artery without angina pectoris: Secondary | ICD-10-CM | POA: Diagnosis present

## 2020-04-07 DIAGNOSIS — E1122 Type 2 diabetes mellitus with diabetic chronic kidney disease: Secondary | ICD-10-CM | POA: Diagnosis present

## 2020-04-07 DIAGNOSIS — Z794 Long term (current) use of insulin: Secondary | ICD-10-CM

## 2020-04-07 DIAGNOSIS — R1310 Dysphagia, unspecified: Secondary | ICD-10-CM | POA: Diagnosis present

## 2020-04-07 DIAGNOSIS — E669 Obesity, unspecified: Secondary | ICD-10-CM | POA: Diagnosis present

## 2020-04-07 DIAGNOSIS — I5032 Chronic diastolic (congestive) heart failure: Secondary | ICD-10-CM | POA: Diagnosis present

## 2020-04-07 DIAGNOSIS — I69391 Dysphagia following cerebral infarction: Secondary | ICD-10-CM

## 2020-04-07 DIAGNOSIS — R339 Retention of urine, unspecified: Secondary | ICD-10-CM | POA: Diagnosis not present

## 2020-04-07 DIAGNOSIS — Z89512 Acquired absence of left leg below knee: Secondary | ICD-10-CM

## 2020-04-07 DIAGNOSIS — B958 Unspecified staphylococcus as the cause of diseases classified elsewhere: Secondary | ICD-10-CM | POA: Diagnosis not present

## 2020-04-07 DIAGNOSIS — N390 Urinary tract infection, site not specified: Secondary | ICD-10-CM | POA: Diagnosis not present

## 2020-04-07 DIAGNOSIS — Z79899 Other long term (current) drug therapy: Secondary | ICD-10-CM

## 2020-04-07 DIAGNOSIS — Z833 Family history of diabetes mellitus: Secondary | ICD-10-CM | POA: Diagnosis not present

## 2020-04-07 DIAGNOSIS — N183 Chronic kidney disease, stage 3 unspecified: Secondary | ICD-10-CM | POA: Diagnosis present

## 2020-04-07 DIAGNOSIS — Z8249 Family history of ischemic heart disease and other diseases of the circulatory system: Secondary | ICD-10-CM

## 2020-04-07 DIAGNOSIS — I69322 Dysarthria following cerebral infarction: Secondary | ICD-10-CM

## 2020-04-07 DIAGNOSIS — I69351 Hemiplegia and hemiparesis following cerebral infarction affecting right dominant side: Secondary | ICD-10-CM | POA: Diagnosis present

## 2020-04-07 DIAGNOSIS — E1169 Type 2 diabetes mellitus with other specified complication: Secondary | ICD-10-CM

## 2020-04-07 DIAGNOSIS — E7800 Pure hypercholesterolemia, unspecified: Secondary | ICD-10-CM

## 2020-04-07 DIAGNOSIS — Z6832 Body mass index (BMI) 32.0-32.9, adult: Secondary | ICD-10-CM

## 2020-04-07 DIAGNOSIS — I252 Old myocardial infarction: Secondary | ICD-10-CM

## 2020-04-07 DIAGNOSIS — E1142 Type 2 diabetes mellitus with diabetic polyneuropathy: Secondary | ICD-10-CM | POA: Diagnosis present

## 2020-04-07 DIAGNOSIS — Z800 Family history of malignant neoplasm of digestive organs: Secondary | ICD-10-CM

## 2020-04-07 DIAGNOSIS — E785 Hyperlipidemia, unspecified: Secondary | ICD-10-CM | POA: Diagnosis present

## 2020-04-07 DIAGNOSIS — I69392 Facial weakness following cerebral infarction: Secondary | ICD-10-CM | POA: Diagnosis not present

## 2020-04-07 DIAGNOSIS — I639 Cerebral infarction, unspecified: Secondary | ICD-10-CM | POA: Diagnosis not present

## 2020-04-07 DIAGNOSIS — Z808 Family history of malignant neoplasm of other organs or systems: Secondary | ICD-10-CM

## 2020-04-07 DIAGNOSIS — Z83438 Family history of other disorder of lipoprotein metabolism and other lipidemia: Secondary | ICD-10-CM

## 2020-04-07 DIAGNOSIS — K5900 Constipation, unspecified: Secondary | ICD-10-CM | POA: Diagnosis not present

## 2020-04-07 DIAGNOSIS — I350 Nonrheumatic aortic (valve) stenosis: Secondary | ICD-10-CM | POA: Diagnosis present

## 2020-04-07 DIAGNOSIS — N179 Acute kidney failure, unspecified: Secondary | ICD-10-CM | POA: Diagnosis present

## 2020-04-07 DIAGNOSIS — B952 Enterococcus as the cause of diseases classified elsewhere: Secondary | ICD-10-CM | POA: Diagnosis not present

## 2020-04-07 DIAGNOSIS — I10 Essential (primary) hypertension: Secondary | ICD-10-CM

## 2020-04-07 DIAGNOSIS — I6300 Cerebral infarction due to thrombosis of unspecified precerebral artery: Secondary | ICD-10-CM

## 2020-04-07 DIAGNOSIS — Z888 Allergy status to other drugs, medicaments and biological substances status: Secondary | ICD-10-CM

## 2020-04-07 DIAGNOSIS — Z09 Encounter for follow-up examination after completed treatment for conditions other than malignant neoplasm: Secondary | ICD-10-CM

## 2020-04-07 DIAGNOSIS — Z955 Presence of coronary angioplasty implant and graft: Secondary | ICD-10-CM

## 2020-04-07 DIAGNOSIS — Z87891 Personal history of nicotine dependence: Secondary | ICD-10-CM

## 2020-04-07 LAB — GLUCOSE, CAPILLARY
Glucose-Capillary: 159 mg/dL — ABNORMAL HIGH (ref 70–99)
Glucose-Capillary: 128 mg/dL — ABNORMAL HIGH (ref 70–99)
Glucose-Capillary: 132 mg/dL — ABNORMAL HIGH (ref 70–99)
Glucose-Capillary: 211 mg/dL — ABNORMAL HIGH (ref 70–99)

## 2020-04-07 LAB — CREATININE, SERUM
Creatinine, Ser: 1.28 mg/dL — ABNORMAL HIGH (ref 0.61–1.24)
GFR, Estimated: 57 mL/min — ABNORMAL LOW (ref >60)

## 2020-04-07 MED ORDER — INSULIN ASPART 100 UNIT/ML ~~LOC~~ SOLN: 14.0000 [IU] | Freq: Three times a day (TID) | SUBCUTANEOUS | 11 refills | Status: DC

## 2020-04-07 MED ORDER — ROSUVASTATIN CALCIUM 20 MG PO TABS
40.0000 mg | ORAL_TABLET | Freq: Every day | ORAL | Status: DC
  Administered 2020-04-08 – 2020-04-26 (×19): 40 mg via ORAL
  Filled 2020-04-07 (×19): qty 2

## 2020-04-07 MED ORDER — INSULIN GLARGINE 100 UNIT/ML ~~LOC~~ SOLN
50.0000 [IU] | Freq: Two times a day (BID) | SUBCUTANEOUS | Status: DC
  Administered 2020-04-07: 50 [IU] via SUBCUTANEOUS
  Filled 2020-04-07 (×3): qty 0.5

## 2020-04-07 MED ORDER — ACETAMINOPHEN 325 MG PO TABS
650.0000 mg | ORAL_TABLET | ORAL | Status: DC | PRN
  Administered 2020-04-17 – 2020-04-26 (×9): 650 mg via ORAL
  Filled 2020-04-07 (×10): qty 2

## 2020-04-07 MED ORDER — METOPROLOL SUCCINATE ER 25 MG PO TB24
25.0000 mg | ORAL_TABLET | Freq: Every day | ORAL | Status: DC
  Administered 2020-04-08 – 2020-04-26 (×19): 25 mg via ORAL
  Filled 2020-04-07 (×19): qty 1

## 2020-04-07 MED ORDER — ESCITALOPRAM OXALATE 10 MG PO TABS
10.0000 mg | ORAL_TABLET | Freq: Every day | ORAL | Status: DC
  Administered 2020-04-07 – 2020-04-26 (×20): 10 mg via ORAL
  Filled 2020-04-07 (×20): qty 1

## 2020-04-07 MED ORDER — ENOXAPARIN SODIUM 40 MG/0.4ML ~~LOC~~ SOLN
40.0000 mg | Freq: Every day | SUBCUTANEOUS | Status: DC
  Administered 2020-04-08 – 2020-04-26 (×19): 40 mg via SUBCUTANEOUS
  Filled 2020-04-07 (×19): qty 0.4

## 2020-04-07 MED ORDER — CLOPIDOGREL BISULFATE 75 MG PO TABS
75.0000 mg | ORAL_TABLET | Freq: Every day | ORAL | Status: DC
  Administered 2020-04-08 – 2020-04-26 (×19): 75 mg via ORAL
  Filled 2020-04-07 (×19): qty 1

## 2020-04-07 MED ORDER — INSULIN GLARGINE 100 UNIT/ML ~~LOC~~ SOLN: 50.0000 [IU] | Freq: Two times a day (BID) | SUBCUTANEOUS | 11 refills | Status: DC

## 2020-04-07 MED ORDER — CLOPIDOGREL BISULFATE 75 MG PO TABS: 75.0000 mg | ORAL_TABLET | Freq: Every day | ORAL | 0 refills | Status: DC

## 2020-04-07 MED ORDER — POLYETHYLENE GLYCOL 3350 17 G PO PACK
17.0000 g | PACK | Freq: Every day | ORAL | Status: DC | PRN
  Administered 2020-04-07 – 2020-04-17 (×2): 17 g via ORAL
  Filled 2020-04-07 (×3): qty 1

## 2020-04-07 MED ORDER — ENOXAPARIN SODIUM 40 MG/0.4ML ~~LOC~~ SOLN: 40.0000 mg | SUBCUTANEOUS | Status: DC

## 2020-04-07 MED ORDER — VITAMIN B-12 1000 MCG PO TABS
1000.0000 ug | ORAL_TABLET | Freq: Every day | ORAL | Status: DC
  Administered 2020-04-08 – 2020-04-26 (×19): 1000 ug via ORAL
  Filled 2020-04-07 (×19): qty 1

## 2020-04-07 MED ORDER — ASPIRIN EC 81 MG PO TBEC: 81.0000 mg | DELAYED_RELEASE_TABLET | Freq: Every day | ORAL | 0 refills | Status: DC

## 2020-04-07 MED ORDER — INSULIN ASPART 100 UNIT/ML ~~LOC~~ SOLN
0.0000 [IU] | Freq: Three times a day (TID) | SUBCUTANEOUS | Status: DC
  Administered 2020-04-07: 3 [IU] via SUBCUTANEOUS
  Administered 2020-04-08: 11 [IU] via SUBCUTANEOUS
  Administered 2020-04-08: 7 [IU] via SUBCUTANEOUS
  Administered 2020-04-08: 4 [IU] via SUBCUTANEOUS
  Administered 2020-04-09: 7 [IU] via SUBCUTANEOUS
  Administered 2020-04-09 – 2020-04-10 (×3): 11 [IU] via SUBCUTANEOUS
  Administered 2020-04-10: 7 [IU] via SUBCUTANEOUS
  Administered 2020-04-10: 11 [IU] via SUBCUTANEOUS
  Administered 2020-04-11: 7 [IU] via SUBCUTANEOUS
  Administered 2020-04-11: 15 [IU] via SUBCUTANEOUS
  Administered 2020-04-11: 11 [IU] via SUBCUTANEOUS
  Administered 2020-04-12: 4 [IU] via SUBCUTANEOUS
  Administered 2020-04-12: 11 [IU] via SUBCUTANEOUS
  Administered 2020-04-12: 15 [IU] via SUBCUTANEOUS
  Administered 2020-04-13: 7 [IU] via SUBCUTANEOUS
  Administered 2020-04-13: 11 [IU] via SUBCUTANEOUS
  Administered 2020-04-13 – 2020-04-14 (×2): 4 [IU] via SUBCUTANEOUS
  Administered 2020-04-14: 7 [IU] via SUBCUTANEOUS
  Administered 2020-04-14: 3 [IU] via SUBCUTANEOUS
  Administered 2020-04-15 (×2): 11 [IU] via SUBCUTANEOUS
  Administered 2020-04-16 – 2020-04-17 (×3): 7 [IU] via SUBCUTANEOUS
  Administered 2020-04-17: 4 [IU] via SUBCUTANEOUS
  Administered 2020-04-17 – 2020-04-18 (×2): 7 [IU] via SUBCUTANEOUS
  Administered 2020-04-18: 4 [IU] via SUBCUTANEOUS
  Administered 2020-04-18: 3 [IU] via SUBCUTANEOUS
  Administered 2020-04-19: 7 [IU] via SUBCUTANEOUS
  Administered 2020-04-19: 4 [IU] via SUBCUTANEOUS
  Administered 2020-04-20: 3 [IU] via SUBCUTANEOUS
  Administered 2020-04-20: 11 [IU] via SUBCUTANEOUS
  Administered 2020-04-21: 7 [IU] via SUBCUTANEOUS
  Administered 2020-04-21: 4 [IU] via SUBCUTANEOUS
  Administered 2020-04-22: 7 [IU] via SUBCUTANEOUS
  Administered 2020-04-22 – 2020-04-23 (×2): 4 [IU] via SUBCUTANEOUS
  Administered 2020-04-23: 3 [IU] via SUBCUTANEOUS
  Administered 2020-04-24: 4 [IU] via SUBCUTANEOUS
  Administered 2020-04-24: 7 [IU] via SUBCUTANEOUS
  Administered 2020-04-25: 3 [IU] via SUBCUTANEOUS
  Administered 2020-04-26: 4 [IU] via SUBCUTANEOUS

## 2020-04-07 MED ORDER — ACETAMINOPHEN 160 MG/5ML PO SOLN
650.0000 mg | ORAL | Status: DC | PRN
Start: 2020-04-07 — End: 2020-04-26

## 2020-04-07 MED ORDER — EZETIMIBE 10 MG PO TABS
10.0000 mg | ORAL_TABLET | Freq: Every day | ORAL | Status: DC
  Administered 2020-04-07 – 2020-04-25 (×19): 10 mg via ORAL
  Filled 2020-04-07 (×19): qty 1

## 2020-04-07 MED ORDER — ACETAMINOPHEN 650 MG RE SUPP
650.0000 mg | RECTAL | Status: DC | PRN
Start: 2020-04-07 — End: 2020-04-26

## 2020-04-07 MED ORDER — LIVING WELL WITH DIABETES BOOK
Freq: Once | Status: AC
  Filled 2020-04-07: qty 1

## 2020-04-07 MED ORDER — ASPIRIN EC 81 MG PO TBEC
81.0000 mg | DELAYED_RELEASE_TABLET | Freq: Every day | ORAL | Status: AC
  Administered 2020-04-08 – 2020-04-21 (×14): 81 mg via ORAL
  Filled 2020-04-07 (×14): qty 1

## 2020-04-07 MED ORDER — CYANOCOBALAMIN 1000 MCG PO TABS: 1000.0000 ug | ORAL_TABLET | Freq: Every day | ORAL | Status: DC

## 2020-04-07 MED ORDER — SENNOSIDES-DOCUSATE SODIUM 8.6-50 MG PO TABS
2.0000 | ORAL_TABLET | Freq: Two times a day (BID) | ORAL | Status: DC
  Administered 2020-04-07 – 2020-04-18 (×20): 2 via ORAL
  Filled 2020-04-07 (×22): qty 2

## 2020-04-07 MED ORDER — BLOOD PRESSURE CONTROL BOOK
Freq: Once | Status: AC
  Filled 2020-04-07: qty 1

## 2020-04-07 NOTE — Progress Notes (Addendum)
Inpatient Rehabilitation Medication Review by a Pharmacist  A complete drug regimen review was completed for this patient to identify any potential clinically significant medication issues.  Clinically significant medication issues were identified:  yes   Type of Medication Issue Identified Description of Issue Urgent (address now) Non-Urgent (address on AM team rounds) Plan Plan Accepted by Provider? (Yes / No / Pending AM Rounds)  Drug Interaction(s) (clinically significant)       Duplicate Therapy       Allergy       No Medication Administration End Date  Per Neuro: DAPT x 3 weeks then Plavix alone.  Need to add 04/21/20 stop date to ASA order. Non-Urgent Add stop date to ASA of 04/21/20 - Messaged PA   Incorrect Dose       Additional Drug Therapy Needed  Lantus 50 units BID and Novolog 14 units TID with meals not reordered.  Pt currently only on resistant SSI.   Urgent Resume Lantus - Messaged PA   Other  PTA meds not resumed: Amlodipine 10mg  qhs, Imdur 30mg  qhs, Losartan 100mg  daily, Metformin 1000mg  BID with meals, Toprol XL 100mg  daily (currently on 25mg  dose) Non-Urgent Follow BP/HR and restart as able. Currently BP 150-160 / 60-80, HR 70s BP ok so some meds are on hold along with metformin    Name of provider notified for urgent issues identified: , PA-C  Provider Method of Notification: secure chat message   For non-urgent medication issues to be resolved on team rounds tomorrow morning a [facility] Secure Chat Handoff was sent to: n/a   Pharmacist comments: see above  Time spent performing this drug regimen review (minutes):  20

## 2020-04-07 NOTE — Progress Notes (Signed)
Inpatient Rehabilitation Admissions Coordinator  CIR bed is available to admit patient to today. I met with patient at bedside, spoke to his wife by phone and confirmed d/c with Dr. [place] Acute team and TOC made aware. I will make the arrangements to admit today.  Danne Baxter, RN, MSN Rehab Admissions Coordinator [phone] 04/07/2020 10:59 AM

## 2020-04-07 NOTE — Progress Notes (Signed)
  Speech Language Pathology Treatment: Dysphagia;Cognitive-Linquistic  Patient Details Name: Jordan [name] MRN: [id number] DOB: [birth year] Today's Date: 04/07/2020 Time: 3009-2330 SLP Time Calculation (min) (ACUTE ONLY): 24 min  Assessment / Plan / Recommendation Clinical Impression  Pt seen for skilled treatment with intake of thin liquids via cup (lunch tray completed when SLP arrived) with slow bolus formation and suspected delay in the initation of the swallow.  Pt expressed dislike of nectar-thickened liquids and SLP provided education re: need for nectar-thickened liquids at this time.  He was in agreement and understood rationale behind thickening liquids.  Pt was given successive sips via cup of nectar-thickened liquids and no overt s/s of aspiration noted.  Continue these with current diet with f/u in CIR.   Pt educated and instituted slow, precise articulation, pausing and pacing speech within simple-complex conversation with monotone, decreased intensity voice noted throughout treatment session.  Discussed implementation of diaphragmatic breathing vs clavicular breathing pattern when speaking d/t dysarthria and low volume/imprecise articulation during speaking tasks.  Pt also exhibited a flat affect and stated he "feels a lot different since the stroke."  Discussed impact of stroke and changes noticed by Mr. Larkey. Simple problem solving functional tasks discussed with min verbal cues required and 80% accuracy noted. He is anticipating rehab and appeared content to progress to CIR.   HPI HPI: Jordan [name] is a 80 y.o. 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 Sx  included weakness w/ multiple falls, slurred speech, difficulty swallowing, L facial droop. MRI shows small left gangliothalamic infarct.      SLP Plan  Continue with current plan of care       Recommendations  Diet recommendations: Regular;Nectar-thick liquid Liquids provided via: Cup Medication Administration: Other (Comment) (whole meds with nectar-thickened liquids) Supervision: Patient able to self feed Compensations: Slow rate;Small sips/bites                General recommendations: Rehab consult Oral Care Recommendations: Oral care BID Follow up Recommendations: Inpatient Rehab SLP Visit Diagnosis: Dysphagia, oropharyngeal phase (R13.12);Cognitive communication deficit (R41.841);Dysarthria and anarthria (R47.1) Plan: Continue with current plan of care                       Tressie Stalker, M.S., CCC-SLP 04/07/2020, 1:34 PM

## 2020-04-07 NOTE — Progress Notes (Signed)
[facility] PHYSICAL MEDICINE & REHABILITATION PROGRESS NOTE   Subjective/Complaints: Pt had a fair night. Used CPAP. Ready to get started with therapy today  ROS: Patient denies fever, rash, sore throat, blurred vision, nausea, vomiting, diarrhea, cough, shortness of breath or chest pain, joint or back pain, headache, or mood change.   Objective:   No results found. No results for input(s): WBC, HGB, HCT, PLT in the last 72 hours. Recent Labs    04/05/20 0320 04/06/20 0305 04/07/20 1518  NA 137 138  --   K 3.9 3.7  --   CL 99 102  --   CO2 26 26  --   GLUCOSE 208* 108*  --   BUN 20 15  --   CREATININE 1.38* 1.25* 1.28*  CALCIUM 9.0 8.5*  --     Intake/Output Summary (Last 24 hours) at 04/07/2020 1928 Last data filed at 04/07/2020 1817 Gross per 24 hour  Intake 120 ml  Output 550 ml  Net -430 ml        Physical Exam: Vital Signs Blood pressure (!) 153/58, pulse 67, temperature 98 F (36.7 C), resp. rate 17, height 5\' 9"  (1.753 m), weight 98.3 kg, SpO2 92 %.  General: Alert and oriented x 3, No apparent distress. obese HEENT: Head is normocephalic, atraumatic, PERRLA, EOMI, sclera anicteric, oral mucosa pink and moist, dentition intact, ext ear canals clear,  Neck: Supple without JVD or lymphadenopathy Heart: Reg rate and rhythm. No murmurs rubs or gallops Chest: CTA bilaterally without wheezes, rales, or rhonchi; no distress Abdomen: Soft, non-tender, non-distended, bowel sounds positive. Extremities: No clubbing, cyanosis, or edema. Pulses are 2+ Skin: Clean and intact without signs of breakdown Neuro: Pt is cognitively appropriate with normal insight, memory, and awareness. Right central 7 with dysarthria.  Sensory exam is normal. Reflexes are 2+ in all 4's. Fine motor coordination is intact. No tremors. RUE 4-/5 prox to distal with PD. RLE 4/5. 5/5 LUE and LLE.   Musculoskeletal: Full ROM, No pain with AROM or PROM in the neck, trunk, or extremities. Posture  appropriate Psych: Pt's affect is appropriate. Pt is cooperative     Assessment/Plan: 1. Functional deficits which require 3+ hours per day of interdisciplinary therapy in a comprehensive inpatient rehab setting.  Physiatrist is providing close team supervision and 24 hour management of active medical problems listed below.  Physiatrist and rehab team continue to assess barriers to discharge/monitor patient progress toward functional and medical goals  Care Tool:  Bathing              Bathing assist       Upper Body Dressing/Undressing Upper body dressing        Upper body assist      Lower Body Dressing/Undressing Lower body dressing            Lower body assist       Toileting Toileting    Toileting assist       Transfers Chair/bed transfer  Transfers assist           Locomotion Ambulation   Ambulation assist              Walk 10 feet activity   Assist           Walk 50 feet activity   Assist           Walk 150 feet activity   Assist           Walk 10 feet on uneven  surface  activity   Assist           Wheelchair     Assist               Wheelchair 50 feet with 2 turns activity    Assist            Wheelchair 150 feet activity     Assist          Blood pressure (!) 153/58, pulse 67, temperature 98 F (36.7 C), resp. rate 17, height 5\' 9"  (1.753 m), weight 98.3 kg, SpO2 92 %.  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 Antithrombotics: -DVT/anticoagulation:Lovenox -antiplatelet therapy: Aspirin 81 mg daily and Plavix 75 mg daily x3 weeks then Plavix alone 3. Pain Management:Tylenol as needed 4. Mood:Resume Lexapro 10 mg daily -antipsychotic agents: N/A 5. Neuropsych:  This patientiscapable of making decisions on hisown behalf. 6. Skin/Wound Care:Routine skin checks 7. Fluids/Electrolytes/Nutrition:Routine in and outs with follow-up chemistries 8. Dysphagia. Carb modified nectar thick liquid. Follow-up speech therapy 9. Diabetes mellitus with obesity,peripheral neuropathy. Hemoglobin A1c 8.5. NovoLog 12units 3 times daily, Lantus insulin 40units twice daily.  -labile readings so far. Monitor for pattern 10. AKI on CKD stage III. Baseline creatinine 1.1-1.4. Follow-up chemistries 11. Permissive hypertension. Presently on Toprol-XL 25 mg daily. Patient on Norvasc 10 mg nightly, Cozaar 100 mg daily, Imdur 30 mg nightly prior to admission.  -1/8 bp in range. No changes today 12. Hyperlipidemia. Zetia/Crestor 13. CAD with stenting. Continue aspirin and Plavix 14. Left BKA 2015. Patient does have a prosthesis which appears to be in working order but is due for replacement soon per pt([employer])    LOS: 0 days A FACE TO FACE EVALUATION WAS PERFORMED  04/07/2020, 7:28 PM

## 2020-04-07 NOTE — Discharge Summary (Signed)
Physician Discharge Summary  Jordan [name] JJO:[id number] DOB: [birth year] DOA: 03/30/2020  PCP: [name], MD  Admit date: 03/30/2020 Discharge date: 04/07/2020  Admitted From: Home Disposition: CIR  Recommendations for Outpatient Follow-up:  1. Follow up with PCP in 1-2 weeks 2. Follow-up with [facility]/stroke clinic in 4 weeks 3. Continue DAPT with Plavix and aspirin x3 weeks followed by plavix alone 4. Continue to monitor glucose closely and may need further titration of insulin 5. Please obtain BMP in one week to assess renal function   Discharge Condition: Stable CODE STATUS: Full code Diet recommendation: Heart healthy/consistent carbohydrate diet  History of present illness:  Jordan [name] is a 80 year old male with past medical history significant for CAD, aortic stenosis, chronic diastolic congestive heart failure, type 2 diabetes mellitus, essential hypertension, hyperlipidemia, CKD stage IIIb, carotid artery stenosis who presented to the ED via EMS for evaluation of left-sided facial droop and hyperglycemia.  Patient spouse reports for the past 3 days patient has had slurred speech, left-sided facial droop, dysphagia with multiple falls and urinary incontinence.  Blood sugars have been elevated for the past week.  Denies history of prior stroke.  Fully vaccinated gets Covid-19.  Was reportedly seen at [facility] day prior for the symptoms and discharged home.  In the ED, vital signs stable.  WBC 6.2, hemoglobin 12.7, platelet 223, sodium 138, potassium 4.1, chloride 104, bicarb 23, BUN 15, creatinine 1.4, glucose 313, INR 1.1.  SARS-CoV-2 PCR and influenza a/B PCR negative.  BNP within normal limits.  High-sensitivity troponin XX 1.  B12 163.  Chest x-ray with no acute cardiopulmonary disease process.  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  Neurology was consulted.  Hospital service  consulted for further evaluation and management of acute stroke.  Hospital course:  Acute ischemic CVA Patient presenting to the ED with 3-day history of multiple falls, slurred speech, left-sided facial droop and dysphagia.  MR brain showing small left gangliothalamic acute infarct without hemorrhage or mass-effect.  MRA head/neck with moderate to severe ICA siphon atherosclerosis with severe right ICA petrous/cavernous segment stenosis, negative for large vessel occlusion, no evidence of hemodynamically significant ICA or vertebral artery stenosis in the neck.  TTE with LVEF 60 to 65%, no regional wall motion abnormalities, grade 1 diastolic dysfunction, LA mildly dilated, moderate aortic valve sclerosis, IVC normal in size. Total cholesterol 165, LDL 66, triglycerides 374, HDL 24.  Hemoglobin A1c 8.5.  Neurology was consulted and followed during hospital course.  Patient was started on DAPT with aspirin and Plavix in which he will continue for 3 weeks followed by Plavix alone.  Speech therapy recommended diet with regular solids, nectar thick liquids, home meds with liquid, aspiration precautions. Continue DAPT with aspirin 81 mg p.o. daily, Plavix 75 mg p.o. daily x3 weeks followed by Plavix alone.  Outpatient follow-up with [facility]/stroke clinic 4 weeks after discharge.  Dysphagia/Mild dysarthria in the setting of acute CVA Continue SLP efforts, discharging to inpatient rehab today.  Acute renal failure Baseline creatinine 1.1 with a GFR greater than 60.  Presented with a creatinine of 1.43 with a GFR of 50, likely secondary to prerenal azotemia poor oral intake take in the days preceding hospitalization.  Patient's creatinine 1.25 at time of discharge.  Recommend repeat BMP 1 week.  Essential hypertension Metoprolol succinate 100 mg p.o. daily, amlodipine 10 mg p.o. daily, losartan 100 mg p.o. daily, isosorbide mononitrate 30 mg p.o. daily.  Furosemide as needed  for  lower extremity edema.  Hyperlipidemia Zetia 10 mg p.o. nightly, Crestor 40 mg p.o. daily  B12 deficiency B12 level 163. Continue B12 supplementation  Chronic diastolic congestive heart failure TTE with LVEF 60 to 65% with grade 1 diastolic dysfunction. Metoprolol succinate 100 mg p.o. daily  Type 2 diabetes mellitus Hemoglobin A1c 8.5, poorly controlled.  Resume home Metformin.  Lantus uptitrated to 50 units twice daily and NovoLog 14 units 3 times daily AC.  Continue to monitor glucose closely and may need further adjustments.  Discharge Diagnoses:  Principal Problem:   Stroke [facility](HCC) Active Problems:   Hyperlipidemia   Essential hypertension   Type 2 diabetes mellitus with diabetic chronic kidney disease (HCC)   Chronic diastolic heart failure [facility](HCC)    Discharge Instructions  Discharge Instructions    Ambulatory referral to Neurology   Complete by: As directed    Follow up with stroke clinic NP (Jessica Vanschaick or Darrol Angelarolyn Martin, if both not available, consider Manson AllanSethi, Penumali, or Ahern) [facility] in about 4 weeks. Thanks.   Diet - low sodium heart healthy   Complete by: As directed    Increase activity slowly   Complete by: As directed      Allergies as of 04/07/2020      Reactions   Cilostazol Other (See Comments)   Feels like feet on fire / Pain in feet      Medication List    STOP taking these medications   insulin detemir 100 unit/ml Soln Commonly known as: LEVEMIR     TAKE these medications   Advil Dual Action 125-250 MG Tabs Generic drug: Ibuprofen-Acetaminophen Take 2 tablets by mouth at bedtime as needed (pain).   amLODipine 10 MG tablet Commonly known as: NORVASC Take 10 mg by mouth at bedtime.   aspirin EC 81 MG tablet Take 1 tablet (81 mg total) by mouth daily for 14 days. Swallow whole.   clopidogrel 75 MG tablet Commonly known as: PLAVIX Take 1 tablet (75 mg total) by mouth daily. Start taking on: April 08, 2020   cyanocobalamin  1000 MCG/ML injection Commonly known as: (VITAMIN B-12) Inject 1,000 mcg into the muscle every 30 (thirty) days. What changed: Another medication with the same name was added. Make sure you understand how and when to take each.   cyanocobalamin 1000 MCG tablet Take 1 tablet (1,000 mcg total) by mouth daily. Start taking on: April 08, 2020 What changed: You were already taking a medication with the same name, and this prescription was added. Make sure you understand how and when to take each.   escitalopram 10 MG tablet Commonly known as: LEXAPRO Take 10 mg by mouth daily.   ezetimibe 10 MG tablet Commonly known as: ZETIA Take 1 tablet (10 mg total) by mouth daily. What changed: when to take this   furosemide 20 MG tablet Commonly known as: LASIX Take 40 mg by mouth daily as needed for fluid or edema.   insulin glargine 100 UNIT/ML injection Commonly known as: LANTUS Inject 0.5 mLs (50 Units total) into the skin 2 (two) times daily.   isosorbide mononitrate 30 MG 24 hr tablet Commonly known as: IMDUR Take 30 mg by mouth at bedtime.   losartan 100 MG tablet Commonly known as: COZAAR Take 100 mg by mouth daily.   metFORMIN 1000 MG tablet Commonly known as: GLUCOPHAGE Take 1,000 mg by mouth 2 (two) times daily with a meal.   metoprolol succinate 100 MG 24 hr tablet Commonly known as:  TOPROL-XL Take 100 mg by mouth daily.   nitroGLYCERIN 0.4 MG SL tablet Commonly known as: NITROSTAT Place 0.4 mg under the tongue every 5 (five) minutes as needed for chest pain.   NovoLOG FlexPen 100 UNIT/ML FlexPen Generic drug: insulin aspart Inject 27 Units into the skin 2 (two) times daily. What changed: Another medication with the same name was added. Make sure you understand how and when to take each.   insulin aspart 100 UNIT/ML injection Commonly known as: novoLOG Inject 14 Units into the skin 3 (three) times daily with meals. What changed: You were already taking a medication  with the same name, and this prescription was added. Make sure you understand how and when to take each.   rosuvastatin 40 MG tablet Commonly known as: CRESTOR Take 1 tablet (40 mg total) by mouth at bedtime.   Vitamin D (Ergocalciferol) 1.25 MG (50000 UNIT) Caps capsule Commonly known as: DRISDOL Take 50,000 Units by mouth every 30 (thirty) days. On the 1st Monday of each month            Durable Medical Equipment  (From admission, onward)         Start     Ordered   04/02/20 0545  For home use only DME 3 n 1  Once        04/02/20 0544          Follow-up Information    [facility]. Schedule an appointment as soon as possible for a visit in 4 week(s).   Specialty: Neurology Contact information: [address] [place] [address] [phone]       [name], MD. Schedule an appointment as soon as possible for a visit in 1 week(s).   Specialty: Internal Medicine Contact information: [address] [place] [address] [phone]              Allergies  Allergen Reactions  . Cilostazol Other (See Comments)    Feels like feet on fire / Pain in feet    Consultations:  Neurology   Procedures/Studies: DG Chest 2 View  Result Date: 03/31/2020 CLINICAL DATA:  Shortness of breath EXAM: CHEST - 2 VIEW COMPARISON:  03/20/2014 FINDINGS: Shallow lung inflation. The heart size and mediastinal contours are within normal limits. Both lungs are clear. The visualized skeletal structures are unremarkable. IMPRESSION: No active cardiopulmonary disease. Electronically Signed   By: Deatra Robinson M.D.   On: 03/31/2020 00:24   MR ANGIO HEAD WO CONTRAST  Result Date: 03/31/2020 CLINICAL DATA:  80 year old male with neurologic symptoms found to have acute on chronic small vessel infarct at the deep gray nuclei early this morning. EXAM: MRA HEAD WITHOUT CONTRAST MRA NECK WITHOUT CONTRAST TECHNIQUE: Angiographic images of  the Circle of Willis were obtained using MRA technique without intravenous contrast. Angiographic images of the neck were obtained using MRA technique without intravenous contrast. Carotid stenosis measurements (when applicable) are obtained utilizing NASCET criteria, using the distal internal carotid diameter as the denominator. COMPARISON:  Brain MRI 0312 hours today. FINDINGS: MRA NECK FINDINGS 3D time-of-flight imaging in the neck. Antegrade flow signal in both cervical carotid and vertebral arteries to the skull base. Motion degraded detail of the carotid bifurcations. No hemodynamically significant stenosis on the left. And no strong evidence of hemodynamically significant stenosis of the right ICA, although the ECA origin may be stenotic. Relatively codominant vertebral arteries in the neck. No evidence of cervical vertebral artery stenosis. MRA HEAD FINDINGS  Better diagnostic quality of the intracranial MRA portion. Antegrade flow in the posterior circulation with patent distal vertebral arteries. No vertebrobasilar junction stenosis. Patent basilar artery, AICA origins, SCA and PCA origins. Posterior communicating arteries are diminutive or absent. Left PCA P1 and branches are within normal limits. There is mild to moderate right PCA P2 irregularity and stenosis. Distal PCA branches are patent. Antegrade flow in both ICA siphons. Extensive distal siphon irregularity in keeping with intracranial atherosclerosis, and high-grade right ICA stenosis is suspected in the distal petrous and proximal cavernous segment (series 5, image 45). Moderate irregularity and stenosis continues to the anterior genu. On the left there is mild to moderate irregularity and stenosis at the anterior genu. Patent carotid termini, MCA and ACA origins. Anterior communicating artery and visible ACA branches are within normal limits. MCA M1 segments and MCA bi/trifurcations are patent without stenosis. Visible bilateral MCA branches are  within normal limits. IMPRESSION: 1. 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 2. Negative for large vessel occlusion. Generally mild intracranial atherosclerosis elsewhere. 3. No evidence of hemodynamically significant ICA or vertebral artery stenosis in the neck. Electronically Signed   By: Genevie Ann M.D.   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 EXAM: MRA HEAD WITHOUT CONTRAST MRA NECK WITHOUT CONTRAST TECHNIQUE: Angiographic images of the Circle of Willis were obtained using MRA technique without intravenous contrast. Angiographic images of the neck were obtained using MRA technique without intravenous contrast. Carotid stenosis measurements (when applicable) are obtained utilizing NASCET criteria, using the distal internal carotid diameter as the denominator. COMPARISON:  Brain MRI 0312 hours today. FINDINGS: MRA NECK FINDINGS 3D time-of-flight imaging in the neck. Antegrade flow signal in both cervical carotid and vertebral arteries to the skull base. Motion degraded detail of the carotid bifurcations. No hemodynamically significant stenosis on the left. And no strong evidence of hemodynamically significant stenosis of the right ICA, although the ECA origin may be stenotic. Relatively codominant vertebral arteries in the neck. No evidence of cervical vertebral artery stenosis. MRA HEAD FINDINGS Better diagnostic quality of the intracranial MRA portion. Antegrade flow in the posterior circulation with patent distal vertebral arteries. No vertebrobasilar junction stenosis. Patent basilar artery, AICA origins, SCA and PCA origins. Posterior communicating arteries are diminutive or absent. Left PCA P1 and branches are within normal limits. There is  mild to moderate right PCA P2 irregularity and stenosis. Distal PCA branches are patent. Antegrade flow in both ICA siphons. Extensive distal siphon irregularity in keeping with intracranial atherosclerosis, and high-grade right ICA stenosis is suspected in the distal petrous and proximal cavernous segment (series 5, image 45). Moderate irregularity and stenosis continues to the anterior genu. On the left there is mild to moderate irregularity and stenosis at the anterior genu. Patent carotid termini, MCA and ACA origins. Anterior communicating artery and visible ACA branches are within normal limits. MCA M1 segments and MCA bi/trifurcations are patent without stenosis. Visible bilateral MCA branches are within normal limits. IMPRESSION: 1. 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 2. Negative for large vessel occlusion. Generally mild intracranial atherosclerosis elsewhere. 3. No evidence of hemodynamically significant ICA or vertebral artery stenosis in the neck. Electronically Signed   By: Herminio Heads.D.  On: 03/31/2020 09:23   MR BRAIN WO CONTRAST  Result Date: 03/31/2020 CLINICAL DATA:  Left-sided facial droop EXAM: MRI HEAD WITHOUT CONTRAST TECHNIQUE: Multiplanar, multiecho pulse sequences of the brain and surrounding structures were obtained without intravenous contrast. COMPARISON:  None. FINDINGS: Brain: There is a small left gangliothalamic acute infarct. No acute or chronic hemorrhage. There is multifocal hyperintense T2-weighted signal within the white matter. Generalized volume loss without a clear lobar predilection. The midline structures are normal. Vascular: Major flow voids are preserved. Skull and upper cervical spine: Normal calvarium and skull base. Visualized upper cervical spine and soft tissues are normal. Sinuses/Orbits:No paranasal sinus fluid levels or advanced mucosal thickening. No mastoid or middle ear  effusion. Normal orbits. IMPRESSION: Small left gangliothalamic acute infarct. No hemorrhage or mass effect. Electronically Signed   By: Deatra Robinson M.D.   On: 03/31/2020 03:52   ECHOCARDIOGRAM COMPLETE  Result Date: 03/31/2020    ECHOCARDIOGRAM REPORT   Patient Name:   YIANNIS TULLOCH Date of Exam: 03/31/2020 Medical Rec #:  [id number]     Height:       69.0 in Accession #:    [id number]    Weight:       219.0 lb Date of Birth:  [birth year]     BSA:          2.147 m Patient Age:    79 years      BP:           175/76 mmHg Patient Gender: M             HR:           65 bpm. Exam Location:  Inpatient Procedure: 2D Echo Indications:    stroke 434.91  History:        Patient has prior history of Echocardiogram examinations, most                 recent 03/28/2014. CAD, chronic kidney disease, Aortic Valve                 Disease; Risk Factors:Diabetes, Hypertension, Dyslipidemia and                 Sleep Apnea.  Sonographer:    Delcie Roch Referring Phys: [id number] VASUNDHRA RATHORE IMPRESSIONS  1. Left ventricular ejection fraction, by estimation, is 60 to 65%. The left ventricle has normal function. The left ventricle has no regional wall motion abnormalities. Left ventricular diastolic parameters are consistent with Grade I diastolic dysfunction (impaired relaxation).  2. Right ventricular systolic function is normal. The right ventricular size is normal.  3. Left atrial size was mildly dilated.  4. The mitral valve is normal in structure. No evidence of mitral valve regurgitation. No evidence of mitral stenosis.  5. The aortic valve is tricuspid. There is mild calcification of the aortic valve. There is moderate thickening of the aortic valve. Aortic valve regurgitation is not visualized. Mild to moderate aortic valve sclerosis/calcification is present, without any evidence of aortic stenosis.  6. The inferior vena cava is normal in size with greater than 50% respiratory variability, suggesting right atrial  pressure of 3 mmHg. FINDINGS  Left Ventricle: Left ventricular ejection fraction, by estimation, is 60 to 65%. The left ventricle has normal function. The left ventricle has no regional wall motion abnormalities. The left ventricular internal cavity size was normal in size. There is  no left ventricular hypertrophy. Left ventricular diastolic parameters are consistent with Grade I diastolic dysfunction (impaired relaxation). Normal  left ventricular filling pressure. Right Ventricle: The right ventricular size is normal. No increase in right ventricular wall thickness. Right ventricular systolic function is normal. Left Atrium: Left atrial size was mildly dilated. Right Atrium: Right atrial size was normal in size. Pericardium: There is no evidence of pericardial effusion. Mitral Valve: The mitral valve is normal in structure. There is mild thickening of the mitral valve leaflet(s). Mild mitral annular calcification. No evidence of mitral valve regurgitation. No evidence of mitral valve stenosis. Tricuspid Valve: The tricuspid valve is normal in structure. Tricuspid valve regurgitation is not demonstrated. No evidence of tricuspid stenosis. Aortic Valve: The aortic valve is tricuspid. There is mild calcification of the aortic valve. There is moderate thickening of the aortic valve. Aortic valve regurgitation is not visualized. Mild to moderate aortic valve sclerosis/calcification is present, without any evidence of aortic stenosis. Aortic valve mean gradient measures 7.7 mmHg. Aortic valve peak gradient measures 13.8 mmHg. Aortic valve area, by VTI measures 2.00 cm. Pulmonic Valve: The pulmonic valve was normal in structure. Pulmonic valve regurgitation is not visualized. No evidence of pulmonic stenosis. Aorta: The aortic root and ascending aorta are structurally normal, with no evidence of dilitation. Venous: The inferior vena cava is normal in size with greater than 50% respiratory variability, suggesting right  atrial pressure of 3 mmHg. IAS/Shunts: No atrial level shunt detected by color flow Doppler.  LEFT VENTRICLE PLAX 2D LVIDd:         4.90 cm     Diastology LVIDs:         3.70 cm     LV e' medial:    4.90 cm/s LV PW:         1.10 cm     LV E/e' medial:  11.3 LV IVS:        1.10 cm     LV e' lateral:   6.64 cm/s LVOT diam:     2.18 cm     LV E/e' lateral: 8.3 LV SV:         82 LV SV Index:   38 LVOT Area:     3.73 cm  LV Volumes (MOD) LV vol d, MOD A2C: 93.7 ml LV vol s, MOD A2C: 53.7 ml LV SV MOD A2C:     40.0 ml RIGHT VENTRICLE             IVC RV S prime:     13.60 cm/s  IVC diam: 1.40 cm TAPSE (M-mode): 1.9 cm LEFT ATRIUM             Index LA diam:        4.10 cm 1.91 cm/m LA Vol (A2C):   49.7 ml 23.15 ml/m LA Vol (A4C):   77.5 ml 36.09 ml/m LA Biplane Vol: 62.5 ml 29.11 ml/m  AORTIC VALVE AV Area (Vmax):    1.51 cm AV Area (Vmean):   1.55 cm AV Area (VTI):     2.00 cm AV Vmax:           185.96 cm/s AV Vmean:          131.924 cm/s AV VTI:            0.413 m AV Peak Grad:      13.8 mmHg AV Mean Grad:      7.7 mmHg LVOT Vmax:         75.00 cm/s LVOT Vmean:        54.900 cm/s LVOT VTI:  0.221 m LVOT/AV VTI ratio: 0.53  AORTA Ao Root diam: 3.10 cm Ao Asc diam:  3.40 cm MITRAL VALVE MV Area (PHT): 2.83 cm     SHUNTS MV Decel Time: 268 msec     Systemic VTI:  0.22 m MV E velocity: 55.20 cm/s   Systemic Diam: 2.18 cm MV A velocity: 128.00 cm/s MV E/A ratio:  0.43 Mihai Croitoru MD Electronically signed by Thurmon Fair MD Signature Date/Time: 03/31/2020/1:05:25 PM    Final      Subjective: Be patient seen and examined at bedside, resting comfortably.  About to eat breakfast.  No family present.  CIR with bed available today with plan to discharge.  No specific complaints or concerns at this time.  Patient denies headache, no fever/chills/night sweats, no nausea/vomiting/diarrhea, no chest pain, no palpitations, no shortness of breath, no abdominal pain, no fatigue.  No acute events overnight per  nursing staff.  Discharge Exam: Vitals:   04/07/20 0300 04/07/20 0756  BP: (!) 160/84 (!) 159/74  Pulse: 92 75  Resp: 18 18  Temp: 99.2 F (37.3 C) 97.8 F (36.6 C)  SpO2: 92% 97%   Vitals:   04/06/20 2318 04/06/20 2324 04/07/20 0300 04/07/20 0756  BP:  138/76 (!) 160/84 (!) 159/74  Pulse: 71 67 92 75  Resp: 19 20 18 18   Temp:  98.8 F (37.1 C) 99.2 F (37.3 C) 97.8 F (36.6 C)  TempSrc:  Axillary Axillary Oral  SpO2: 94% 96% 92% 97%  Weight:      Height:        General: Pt is alert, awake, not in acute distress Cardiovascular: RRR, S1/S2 +, no rubs, no gallops Respiratory: CTA bilaterally, no wheezing, no rhonchi, oxygenating well on room air Abdominal: Soft, NT, ND, bowel sounds + Extremities: no edema, no cyanosis, left BKA site noted    The results of significant diagnostics from this hospitalization (including imaging, microbiology, ancillary and laboratory) are listed below for reference.     Microbiology: Recent Results (from the past 240 hour(s))  Resp Panel by RT-PCR (Flu A&B, Covid) Nasopharyngeal Swab     Status: None   Collection Time: 03/31/20 12:27 AM   Specimen: Nasopharyngeal Swab; Nasopharyngeal(NP) swabs in vial transport medium  Result Value Ref Range Status   SARS Coronavirus 2 by RT PCR NEGATIVE NEGATIVE Final    Comment: (NOTE) SARS-CoV-2 target nucleic acids are NOT DETECTED.  The SARS-CoV-2 RNA is generally detectable in upper respiratory specimens during the acute phase of infection. The lowest concentration of SARS-CoV-2 viral copies this assay can detect is 138 copies/mL. A negative result does not preclude SARS-Cov-2 infection and should not be used as the sole basis for treatment or other patient management decisions. A negative result may occur with  improper specimen collection/handling, submission of specimen other than nasopharyngeal swab, presence of viral mutation(s) within the areas targeted by this assay, and inadequate  number of viral copies(<138 copies/mL). A negative result must be combined with clinical observations, patient history, and epidemiological information. The expected result is Negative.  Fact Sheet for Patients:  04/02/20  Fact Sheet for Healthcare Providers:  BloggerCourse[employer]com  This test is no t yet approved or cleared by the [employer] FDA and  has been authorized for detection and/or diagnosis of SARS-CoV-2 by FDA under an Emergency Use Authorization (EUA). This EUA will remain  in effect (meaning this test can be used) for the duration of the COVID-19 declaration under Section 564(b)(1) of the Act, 21 U.S.C.section  360bbb-3(b)(1), unless the authorization is terminated  or revoked sooner.       Influenza A by PCR NEGATIVE NEGATIVE Final   Influenza B by PCR NEGATIVE NEGATIVE Final    Comment: (NOTE) The Xpert Xpress SARS-CoV-2/FLU/RSV plus assay is intended as an aid in the diagnosis of influenza from Nasopharyngeal swab specimens and should not be used as a sole basis for treatment. Nasal washings and aspirates are unacceptable for Xpert Xpress SARS-CoV-2/FLU/RSV testing.  Fact Sheet for Patients: BloggerCourse.com  Fact Sheet for Healthcare Providers: SeriousBroker.it  This test is not yet approved or cleared by the [place] FDA and has been authorized for detection and/or diagnosis of SARS-CoV-2 by FDA under an Emergency Use Authorization (EUA). This EUA will remain in effect (meaning this test can be used) for the duration of the COVID-19 declaration under Section 564(b)(1) of the Act, 21 U.S.C. section 360bbb-3(b)(1), unless the authorization is terminated or revoked.  Performed at [facility], [address], [place], [place] [address]      Labs: BNP (last 3 results) Recent Labs    03/31/20 0030  BNP 32.2   Basic Metabolic  Panel: Recent Labs  Lab 04/01/20 0154 04/05/20 0320 04/06/20 0305  NA 134* 137 138  K 4.5 3.9 3.7  CL 100 99 102  CO2 24 26 26   GLUCOSE 279* 208* 108*  BUN 15 20 15   CREATININE 1.20 1.38* 1.25*  CALCIUM 9.2 9.0 8.5*   Liver Function Tests: No results for input(s): AST, ALT, ALKPHOS, BILITOT, PROT, ALBUMIN in the last 168 hours. No results for input(s): LIPASE, AMYLASE in the last 168 hours. No results for input(s): AMMONIA in the last 168 hours. CBC: Recent Labs  Lab 04/01/20 0154  WBC 5.6  HGB 12.8*  HCT 37.8*  MCV 85.7  PLT 235   Cardiac Enzymes: No results for input(s): CKTOTAL, CKMB, CKMBINDEX, TROPONINI in the last 168 hours. BNP: Invalid input(s): POCBNP CBG: Recent Labs  Lab 04/06/20 0652 04/06/20 1214 04/06/20 1610 04/06/20 2203 04/07/20 0648  GLUCAP 97 198* 147* 183* 159*   D-Dimer No results for input(s): DDIMER in the last 72 hours. Hgb A1c No results for input(s): HGBA1C in the last 72 hours. Lipid Profile No results for input(s): CHOL, HDL, LDLCALC, TRIG, CHOLHDL, LDLDIRECT in the last 72 hours. Thyroid function studies No results for input(s): TSH, T4TOTAL, T3FREE, THYROIDAB in the last 72 hours.  Invalid input(s): FREET3 Anemia work up No results for input(s): VITAMINB12, FOLATE, FERRITIN, TIBC, IRON, RETICCTPCT in the last 72 hours. Urinalysis    Component Value Date/Time   COLORURINE YELLOW 04/01/2020 0125   APPEARANCEUR CLEAR 04/01/2020 0125   LABSPEC 1.014 04/01/2020 0125   PHURINE 5.0 04/01/2020 0125   GLUCOSEU >=500 (A) 04/01/2020 0125   GLUCOSEU 250 12/19/2010 1625   HGBUR NEGATIVE 04/01/2020 0125   BILIRUBINUR NEGATIVE 04/01/2020 0125   KETONESUR NEGATIVE 04/01/2020 0125   PROTEINUR NEGATIVE 04/01/2020 0125   UROBILINOGEN 0.2 03/20/2014 1549   NITRITE NEGATIVE 04/01/2020 0125   LEUKOCYTESUR NEGATIVE 04/01/2020 0125   Sepsis Labs Invalid input(s): PROCALCITONIN,  WBC,  LACTICIDVEN Microbiology Recent Results (from the  past 240 hour(s))  Resp Panel by RT-PCR (Flu A&B, Covid) Nasopharyngeal Swab     Status: None   Collection Time: 03/31/20 12:27 AM   Specimen: Nasopharyngeal Swab; Nasopharyngeal(NP) swabs in vial transport medium  Result Value Ref Range Status   SARS Coronavirus 2 by RT PCR NEGATIVE NEGATIVE Final    Comment: (NOTE) SARS-CoV-2 target  nucleic acids are NOT DETECTED.  The SARS-CoV-2 RNA is generally detectable in upper respiratory specimens during the acute phase of infection. The lowest concentration of SARS-CoV-2 viral copies this assay can detect is 138 copies/mL. A negative result does not preclude SARS-Cov-2 infection and should not be used as the sole basis for treatment or other patient management decisions. A negative result may occur with  improper specimen collection/handling, submission of specimen other than nasopharyngeal swab, presence of viral mutation(s) within the areas targeted by this assay, and inadequate number of viral copies(<138 copies/mL). A negative result must be combined with clinical observations, patient history, and epidemiological information. The expected result is Negative.  Fact Sheet for Patients:  BloggerCourse.comhttps://www.fda.gov/media/152166/download  Fact Sheet for Healthcare Providers:  SeriousBroker.ithttps://www.fda.gov/media/152162/download  This test is no t yet approved or cleared by the [place] FDA and  has been authorized for detection and/or diagnosis of SARS-CoV-2 by FDA under an Emergency Use Authorization (EUA). This EUA will remain  in effect (meaning this test can be used) for the duration of the COVID-19 declaration under Section 564(b)(1) of the Act, 21 U.S.C.section 360bbb-3(b)(1), unless the authorization is terminated  or revoked sooner.       Influenza A by PCR NEGATIVE NEGATIVE Final   Influenza B by PCR NEGATIVE NEGATIVE Final    Comment: (NOTE) The Xpert Xpress SARS-CoV-2/FLU/RSV plus assay is intended as an aid in the diagnosis of  influenza from Nasopharyngeal swab specimens and should not be used as a sole basis for treatment. Nasal washings and aspirates are unacceptable for Xpert Xpress SARS-CoV-2/FLU/RSV testing.  Fact Sheet for Patients: BloggerCourse.comhttps://www.fda.gov/media/152166/download  Fact Sheet for Healthcare Providers: SeriousBroker.ithttps://www.fda.gov/media/152162/download  This test is not yet approved or cleared by the [place] FDA and has been authorized for detection and/or diagnosis of SARS-CoV-2 by FDA under an Emergency Use Authorization (EUA). This EUA will remain in effect (meaning this test can be used) for the duration of the COVID-19 declaration under Section 564(b)(1) of the Act, 21 U.S.C. section 360bbb-3(b)(1), unless the authorization is terminated or revoked.  Performed at [facility], [address] [place], [place] [id number]      Time coordinating discharge: Over 30 minutes  SIGNED:   Alvira PhilipsEric J [place], DO  [facility] 04/07/2020, 11:06 AM

## 2020-04-07 NOTE — H&P (Signed)
Physical Medicine and Rehabilitation Admission H&P        Chief Complaint  Patient presents with  . Facial Droop  . 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  Per chart review lives with spouse independent with assistive device.  His wife does assist with some basic ADLs.  He has not driven for a couple of years.  1 level home ramped entrance.  Good family support.  Presented 03/31/2020 with gait abnormality slurred speech left facial droop multiple falls.  MRI showed small left gangliothalamic acute infarction no hemorrhage or mass-effect.  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  Negative for large vessel occlusion.  No evidence of hemodynamically significant ICA or vertebral artery stenosis in the neck.  Admission chemistries unremarkable except glucose 313 creatinine 1.43 hemoglobin 12.7 BNP 32 troponin 21-15.  Echocardiogram with ejection fraction of 60 to 65% no wall motion abnormalities grade 1 diastolic dysfunction.  Maintained on aspirin and Plavix x3 weeks then Plavix alone for CVA prophylaxis.  Subcutaneous Lovenox for DVT prophylaxis.  Carb modified diet with nectar liquids.  Physical medicine rehabilitation consulted due to patient's decrease in functional ability and slurred speech and was admitted for a comprehensive rehab program.   Review of Systems  Constitutional: Negative for chills and fever.  HENT: Negative for hearing loss.   Eyes: Negative for blurred vision and double vision.  Respiratory: Negative for cough and shortness of breath.   Cardiovascular: Positive for leg swelling. Negative for chest pain and palpitations.   Gastrointestinal: Positive for constipation. Negative for heartburn, nausea and vomiting.  Genitourinary: Negative for dysuria, flank pain and hematuria.  Musculoskeletal: Positive for falls and myalgias.  Skin: Negative for rash.  Neurological: Positive for speech change and weakness.  Psychiatric/Behavioral: Positive for depression.  All other systems reviewed and are negative.       Past Medical History:  Diagnosis Date  . Anemia    . Aortic stenosis    . CAD (coronary artery disease)      a. s/p IMI in past tx with POBA and cath 6 mos later with occluded RCA;  b. h/o Taxus DES to LAD and CFX;  c.  cath 1/10: LM ok, mLAD 40-50%, LAD stent ok, D2 tandem 60-70%, pCFX 30%, mAVCFX stent ok, pRCA 40%, RV 90%, RV marginal 90%, dRCA filled L-R collats tx medically  . Carotid stenosis      dopplers 5/11: 40-59% bilat  . CHF (congestive heart failure) (HCC)    . Diabetic ulcer of left foot (HCC)    . DM2 (diabetes mellitus, type 2) (HCC)    . Heart murmur    . HLD (hyperlipidemia)    . HTN (hypertension)    . Myocardial infarction (HCC) 1995 and 2005    Heart Attack  . Osteomyelitis of left foot (HCC)    . Staphylococcus aureus bacteremia with sepsis [facility])           Past Surgical History:  Procedure Laterality Date  . ABDOMINAL AORTAGRAM N/A 04/07/2013    Procedure: ABDOMINAL [name];  Surgeon: [name], MD;  Location: [facility] CATH LAB;  Service: Cardiovascular;  Laterality: N/A;  . ABDOMINAL AORTAGRAM N/A 01/26/2014    Procedure:  ABDOMINAL AORTAGRAM;  Surgeon: Iran OuchMuhammad A Arida, MD;  [facility];  Service: Cardiovascular;  Laterality: N/A;  . ABDOMINAL AORTAGRAM N/A 06/13/2014    Procedure: ABDOMINAL Ronny FlurryAORTAGRAM;  Surgeon: Chuck Hinthristopher S Dickson, MD;  [facility];  Service: Cardiovascular;  Laterality: N/A;  . AMPUTATION Left 01/28/2014    Procedure: AMPUTATION DIGIT- LEFT 5TH TOE;  Surgeon: Chuck Hinthristopher S Dickson, MD;  [facility];  Service: Vascular;   Laterality: Left;  . AMPUTATION Left 02/02/2014    Procedure: Left 4th Ray Amputation vs Transmetatarsal Amputation;  Surgeon: Nadara MustardMarcus Duda V, MD;  Location: [facility] OR;  Service: Orthopedics;  Laterality: Left;  . AMPUTATION Left 03/24/2014    Procedure: AMPUTATION BELOW KNEE;  Surgeon: Cammy CopaGregory Scott Dean, MD;  [facility];  Service: Orthopedics;  Laterality: Left;  . APPENDECTOMY      . CARDIAC CATHETERIZATION      . CORONARY ANGIOPLASTY WITH STENT PLACEMENT      . FEMORAL-TIBIAL BYPASS GRAFT Left 01/28/2014    Procedure:  FEMORAL-ANTERIOR TIBIAL ARTERY Bypass Graft utilizing composite gortex graft and vein graft;  Surgeon: Chuck Hinthristopher S Dickson, MD;  [facility];  Service: Vascular;  Laterality: Left;  . LOWER EXTREMITY ANGIOGRAM Left 01/26/2014    Procedure: LOWER EXTREMITY ANGIOGRAM;  Surgeon: Iran OuchMuhammad A Arida, MD;  Location: [facility] CATH LAB;  Service: Cardiovascular;  Laterality: Left;  . RIGHT/LEFT HEART CATH AND CORONARY ANGIOGRAPHY N/A 12/02/2017    Procedure: RIGHT/LEFT HEART CATH AND CORONARY ANGIOGRAPHY;  Surgeon: [name], [name], MD;  [facility];  Service: Cardiovascular;  Laterality: N/A;  . TEE WITHOUT CARDIOVERSION N/A 03/28/2014    Procedure: TRANSESOPHAGEAL ECHOCARDIOGRAM (TEE);  Surgeon: [name], MD;  [facility];  Service: Cardiovascular;  Laterality: N/A;         Family History  Problem Relation Age of Onset  . Diabetes Mother    . Cancer Mother          Pancreatic  . Heart disease Mother          After age 80  . Hypertension Mother    . Hyperlipidemia Mother    . Diabetes Sister    . Cancer Sister    . Cancer Sister          Bone  . Cancer Father      Social History:  reports that he quit smoking about 41 years ago. His smoking use included pipe. He has never used smokeless tobacco. He reports that he does not drink alcohol and does not use drugs. Allergies:       Allergies  Allergen Reactions  . Cilostazol Other (See  Comments)      Feels like feet on fire / Pain in feet          Medications Prior to Admission  Medication Sig Dispense Refill  . amLODipine (NORVASC) 10 MG tablet Take 10 mg by mouth at bedtime.      [name]. aspirin EC 81 MG tablet Take 81 mg by mouth daily. Swallow whole.      . cyanocobalamin (,VITAMIN B-12,) 1000 MCG/ML injection Inject 1,000 mcg into the muscle every 30 (thirty) days.      [name]. escitalopram (LEXAPRO) 10 MG tablet Take 10 mg by mouth daily.      [name]. ezetimibe (ZETIA) 10 MG tablet Take 1 tablet (10 mg total) by mouth daily. (Patient taking differently: Take 10 mg by mouth at bedtime.) 90 tablet 3  . furosemide (LASIX) 20  MG tablet Take 40 mg by mouth daily as needed for fluid or edema.      . Ibuprofen-Acetaminophen (ADVIL DUAL ACTION) 125-250 MG TABS Take 2 tablets by mouth at bedtime as needed (pain).      . insulin aspart (NOVOLOG FLEXPEN) 100 UNIT/ML FlexPen Inject 27 Units into the skin 2 (two) times daily.      . insulin detemir (LEVEMIR) 100 unit/ml SOLN Inject 27 Units into the skin 2 (two) times daily.      . isosorbide mononitrate (IMDUR) 30 MG 24 hr tablet Take 30 mg by mouth at bedtime.      [name] losartan (COZAAR) 100 MG tablet Take 100 mg by mouth daily.      . metFORMIN (GLUCOPHAGE) 1000 MG tablet Take 1,000 mg by mouth 2 (two) times daily with a meal.      . metoprolol (TOPROL-XL) 100 MG 24 hr tablet Take 100 mg by mouth daily.      . nitroGLYCERIN (NITROSTAT) 0.4 MG SL tablet Place 0.4 mg under the tongue every 5 (five) minutes as needed for chest pain.      . rosuvastatin (CRESTOR) 40 MG tablet Take 1 tablet (40 mg total) by mouth at bedtime. 90 tablet 3  . Vitamin D, Ergocalciferol, (DRISDOL) 1.25 MG (50000 UNIT) CAPS capsule Take 50,000 Units by mouth every 30 (thirty) days. On the 1st Monday of each month          Drug Regimen Review Drug regimen was reviewed and remains appropriate with no significant issues identified   Home: Home Living Family/patient expects  to be discharged to:: Private residence Living Arrangements: Spouse/significant other Available Help at Discharge: Family,Available 24 hours/day Type of Home: House Home Access: Ramped entrance Home Layout: One level Bathroom Shower/Tub: Multimedia programmer: Handicapped height Bathroom Accessibility: Yes Home Equipment: [place] - single point,Walker - 2 wheels,Walker - 4 wheels Additional Comments: wife present and reports pt normally has additional sleeves for prosthetic but didnt grab them in a rush to leave with EMS  Lives With: Spouse   Functional History: Prior Function Level of Independence: Independent with assistive device(s) ADL's / Homemaking Assistance Needed: normally completes all adls indep Comments: pt ambulates with use of cane vs RW, does require some assistance with bathing   Functional Status:  Mobility: Bed Mobility Overal bed mobility: Needs Assistance Bed Mobility: Supine to Sit Supine to sit: Mod assist,HOB elevated General bed mobility comments: Pt using R bed rail to pull UB off bed surface and mod (A) to keep trunk off bed surface. pt requires mod (A) to bring R hip out to eob. Transfers Overall transfer level: Needs assistance Equipment used: Rolling walker (2 wheeled) Transfers: Sit to/from [employer] Sit to Stand: Min assist Stand pivot transfers: Min assist General transfer comment: Pt from bed surface and hand over hand for R UE Ambulation/Gait Ambulation/Gait assistance: Min assist Gait Distance (Feet): 20 Feet Assistive device: Rolling walker (2 wheeled) Gait Pattern/deviations: Step-to pattern General Gait Details: pt with shorrt step-to gait, reduced foot clearance bilaterally, increased trunk flexion with fatigue Gait velocity: reduced Gait velocity interpretation: <1.31 ft/sec, indicative of household ambulator   ADL: ADL Overall ADL's : Needs assistance/impaired Grooming: Maximal assistance Upper Body Bathing  Details (indicate cue type and reason): wife completed bath on arrival max (A) Lower Body Dressing: Maximal assistance Lower Body Dressing Details (indicate cue type and reason): pt helping rolling sleeve onto LLE. pt requires total (A) to place prosthetic Toilet Transfer: Minimal  assistance,RW General ADL Comments: pt very lethargic but following commands between arousals techniques. pt noted to have facial droop R side, speech slightly more slurred and decreased arousal. wife reports his face is more droopy. OT informed RN and in room at bedside   Cognition: Cognition Overall Cognitive Status: Impaired/Different from baseline Arousal/Alertness: Awake/alert Orientation Level: Oriented X4 Attention: Sustained Sustained Attention: Impaired Sustained Attention Impairment: Verbal basic Memory: Impaired Memory Impairment: Retrieval deficit,Storage deficit (1/4 delayed recall, 4/4 immediate) Awareness: Impaired Awareness Impairment: Emergent impairment (appears intact for speech, not swallow) Problem Solving: Appears intact Executive Function: Self Monitoring Self Monitoring: Impaired Self Monitoring Impairment: Verbal basic Safety/Judgment: Appears intact Cognition Arousal/Alertness: Lethargic Behavior During Therapy: Flat affect Overall Cognitive Status: Impaired/Different from baseline Area of Impairment: Problem solving Problem Solving: Slow processing General Comments: pt arouses to name call and following commands. pt opens eyes and says "hey darling" in response to therapist present   Physical Exam: Blood pressure (!) 169/89, pulse (!) 109, temperature 97.8 F (36.6 C), temperature source Oral, resp. rate 20, height 5\' 9"  (1.753 m), weight 104.3 kg, SpO2 96 %. Physical Exam Constitutional:      General: He is not in acute distress.    Appearance: He is obese.  HENT:     Head: Normocephalic.     Right Ear: External ear normal.     Left Ear: External ear normal.     Nose:  Nose normal.     Mouth/Throat:     Mouth: Mucous membranes are moist.     Pharynx: No oropharyngeal exudate.  Eyes:     Comments: Slow to track to all fields  Cardiovascular:     Rate and Rhythm: Regular rhythm. Tachycardia present.     Heart sounds: No murmur heard. No gallop.   Pulmonary:     Effort: Pulmonary effort is normal. No respiratory distress.     Breath sounds: No wheezing.  Abdominal:     General: Bowel sounds are normal. There is no distension.     Tenderness: There is no abdominal tenderness.  Musculoskeletal:     Cervical back: Normal range of motion and neck supple.     Comments: Left BK stump well formed. Wearing shrinker. BK prosthesis appears intact  Skin:    General: Skin is warm.     Comments: Left BKA site well-healed.  Neurological:     Comments: Patient is alert but appears fatigued.  Mild dysarthria but intelligible. Mild right central 7.   Oriented x3 and follows commands.  Reasonable insight and awareness. Functional memory. RUE 4-/5 prox to distal with PD. LUE 5/5. RLE 4/5 prox to distal. LLE 4+ to 5/5. Senses pain and LT fairly equally in all 4's   Psychiatric:        Mood and Affect: Mood normal.        Behavior: Behavior normal.        Thought Content: Thought content normal.        Judgment: Judgment normal.        Lab Results Last 48 Hours        Results for orders placed or performed during the hospital encounter of 03/30/20 (from the past 48 hour(s))  Glucose, capillary     Status: Abnormal    Collection Time: 04/01/20  5:05 PM  Result Value Ref Range    Glucose-Capillary 334 (H) 70 - 99 mg/dL      Comment: Glucose reference range applies only to samples taken after fasting for at least  8 hours.    Comment 1 Notify RN      Comment 2 Document in Chart    Glucose, capillary     Status: Abnormal    Collection Time: 04/01/20  9:39 PM  Result Value Ref Range    Glucose-Capillary 267 (H) 70 - 99 mg/dL      Comment: Glucose reference range  applies only to samples taken after fasting for at least 8 hours.    Comment 1 Notify RN      Comment 2 Document in Chart    Glucose, capillary     Status: Abnormal    Collection Time: 04/02/20  6:43 AM  Result Value Ref Range    Glucose-Capillary 260 (H) 70 - 99 mg/dL      Comment: Glucose reference range applies only to samples taken after fasting for at least 8 hours.    Comment 1 Notify RN      Comment 2 Document in Chart    Glucose, capillary     Status: Abnormal    Collection Time: 04/02/20 11:31 AM  Result Value Ref Range    Glucose-Capillary 222 (H) 70 - 99 mg/dL      Comment: Glucose reference range applies only to samples taken after fasting for at least 8 hours.  Glucose, capillary     Status: Abnormal    Collection Time: 04/02/20  4:17 PM  Result Value Ref Range    Glucose-Capillary 238 (H) 70 - 99 mg/dL      Comment: Glucose reference range applies only to samples taken after fasting for at least 8 hours.  Glucose, capillary     Status: Abnormal    Collection Time: 04/02/20  9:07 PM  Result Value Ref Range    Glucose-Capillary 232 (H) 70 - 99 mg/dL      Comment: Glucose reference range applies only to samples taken after fasting for at least 8 hours.  Glucose, capillary     Status: Abnormal    Collection Time: 04/03/20  7:57 AM  Result Value Ref Range    Glucose-Capillary 191 (H) 70 - 99 mg/dL      Comment: Glucose reference range applies only to samples taken after fasting for at least 8 hours.  Glucose, capillary     Status: Abnormal    Collection Time: 04/03/20 11:38 AM  Result Value Ref Range    Glucose-Capillary 233 (H) 70 - 99 mg/dL      Comment: Glucose reference range applies only to samples taken after fasting for at least 8 hours.      Imaging Results (Last 48 hours)  No results found.           Medical Problem List and Plan: 1.  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  Antithrombotics: -DVT/anticoagulation: Lovenox             -antiplatelet therapy: Aspirin 81 mg daily and Plavix 75 mg daily x3 weeks then Plavix alone 3. Pain Management: Tylenol as needed 4. Mood: Resume Lexapro 10 mg daily             -antipsychotic agents: N/A 5. Neuropsych: This patient is capable of making decisions on his own behalf. 6. Skin/Wound Care: Routine skin checks 7. Fluids/Electrolytes/Nutrition: Routine in and outs with  follow-up chemistries 8.  Dysphagia.  Carb modified nectar thick liquid.  Follow-up speech therapy 9.  Diabetes mellitus with obesity, peripheral neuropathy.  Hemoglobin A1c 8.5.  NovoLog 12 units 3 times daily, Lantus insulin 40 units twice daily.              -Check blood sugars before meals and at bedtime 10.  AKI on CKD stage III.  Baseline creatinine 1.1-1.4.  Follow-up chemistries 11.  Permissive hypertension.  Presently on Toprol-XL 25 mg daily.  Patient on Norvasc 10 mg nightly, Cozaar 100 mg daily, Imdur 30 mg nightly prior to admission.               -Resume meds as needed 12.  Hyperlipidemia.  Zetia/Crestor 13.  CAD with stenting.  Continue aspirin and Plavix 14.  Left BKA 2015.  Patient does have a prosthesis which appears to be in working order but is due for replacement soon per pt([employer])       Charlton Amor, PA-C 04/03/2020  I have personally performed a face to face diagnostic evaluation of this patient and formulated the key components of the plan.  Additionally, I have personally reviewed laboratory data, imaging studies, as well as relevant notes and concur with the physician assistant's documentation above.  The patient's status has not changed from the original H&P.  Any changes in documentation from the acute care chart have been noted above.  Ranelle Oyster, MD, [place]

## 2020-04-07 NOTE — Progress Notes (Signed)
[name], Jordan Sparrow, MD  Physician  Physical Medicine and Rehabilitation  Consult Note     Signed  Date of Service:  04/03/2020  5:15 AM      Related encounter: ED to Hosp-Admission (Discharged) from 03/30/2020 in [facility]       Signed      Expand All Collapse All     Show:Clear all [x] Manual[x] Template[] Copied  Added by: [x] Angiulli, , PA-C[x] [name], , MD   [] Hover for details           Physical Medicine and Rehabilitation Consult Reason for Consult: Slurred speech, multiple falls with gait abnormality Referring Physician: [facility]     HPI: Jordan [name] is a 80 y.o. right-handed male with history of CAD maintained on aspirin, aortic stenosis, chronic diastolic congestive heart failure, insulin-dependent diabetes mellitus hypertension hyperlipidemia, CKD stage III, carotid stenosis.  Per chart review lives with spouse independent with assistive device 1 level home ramped entrance.  Good family support.  Presented 03/31/2020 with gait abnormality slurred speech, left facial droop and multiple falls.  MRI showed small left gangliothalamic acute infarct no hemorrhage or mass-effect.  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  Negative for large vessel occlusion.  No evidence of hemodynamically significant ICA or vertebral artery stenosis in the neck.  Admission chemistries unremarkable except glucose 313, creatinine 1.43, hemoglobin 12.7, BNP 32, troponin 21-15.  Echocardiogram ejection fraction of 60 to 65% no wall motion abnormalities grade 1 diastolic dysfunction.  Currently maintained on aspirin and Plavix for CVA prophylaxis x3 weeks then Plavix alone.  Subcutaneous Lovenox for DVT prophylaxis.  Carb modified diet with nectar thick liquids.  Physical medicine rehab consult requested due to patient's decreased functional mobility and slurred  speech.   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 He uses a cane when ambulating. His wife does assist him with donning sock and shoe on the right side. The patient also needs assist washing his back and otherwise provided set up assistance for clothing. The patient was otherwise modified independent. He has not driven for a couple years. The patient notes visual problems due to his diabetes and has some difficulty reading although he can still read.   Review of Systems  Constitutional: Negative for chills and fever.  HENT: Negative for hearing loss.   Eyes: Negative for blurred vision and double vision.  Respiratory: Negative for cough and shortness of breath.   Cardiovascular: Negative for chest pain, palpitations and leg swelling.  Gastrointestinal: Positive for constipation. Negative for heartburn, nausea and vomiting.  Genitourinary: Negative for dysuria, flank pain and hematuria.  Musculoskeletal: Positive for falls and myalgias.  Skin: Negative for rash.  Neurological: Positive for speech change and weakness.  Psychiatric/Behavioral: Positive for depression.  All other systems reviewed and are negative.       Past Medical History:  Diagnosis Date  . Anemia    . Aortic stenosis    . CAD (coronary artery disease)      a. s/p IMI in past tx with POBA and cath 6 mos later with occluded RCA;  b. h/o Taxus DES to LAD and CFX;  c.  cath 1/10: LM ok, mLAD 40-50%, LAD stent ok, D2 tandem 60-70%, pCFX 30%, mAVCFX stent ok, pRCA 40%, RV 90%, RV marginal 90%, dRCA filled L-R collats tx medically  . Carotid stenosis      dopplers  5/11: 40-59% bilat  . CHF (congestive heart failure) (HCC)    . Diabetic ulcer of left foot (HCC)    . DM2 (diabetes mellitus, type 2) (HCC)    . Heart murmur    . HLD (hyperlipidemia)    . HTN (hypertension)    . Myocardial infarction (HCC) 1995 and 2005    Heart Attack  . Osteomyelitis of left foot (HCC)    . Staphylococcus aureus  bacteremia with sepsis [facility](HCC)           Past Surgical History:  Procedure Laterality Date  . ABDOMINAL AORTAGRAM N/A 04/07/2013    Procedure: ABDOMINAL Ronny FlurryAORTAGRAM;  Surgeon: Iran OuchMuhammad A Arida, MD;  Location: [facility] CATH LAB;  Service: Cardiovascular;  Laterality: N/A;  . ABDOMINAL AORTAGRAM N/A 01/26/2014    Procedure: ABDOMINAL Ronny FlurryAORTAGRAM;  Surgeon: Iran OuchMuhammad A Arida, MD;  Location: [facility] CATH LAB;  Service: Cardiovascular;  Laterality: N/A;  . ABDOMINAL AORTAGRAM N/A 06/13/2014    Procedure: ABDOMINAL Ronny FlurryAORTAGRAM;  Surgeon: Chuck Hinthristopher S Dickson, MD;  [facility];  Service: Cardiovascular;  Laterality: N/A;  . AMPUTATION Left 01/28/2014    Procedure: AMPUTATION DIGIT- LEFT 5TH TOE;  Surgeon: Chuck Hinthristopher S Dickson, MD;  [facility];  Service: Vascular;  Laterality: Left;  . AMPUTATION Left 02/02/2014    Procedure: Left 4th Ray Amputation vs Transmetatarsal Amputation;  Surgeon: Nadara MustardMarcus Duda V, MD;  Location: [facility] OR;  Service: Orthopedics;  Laterality: Left;  . AMPUTATION Left 03/24/2014    Procedure: AMPUTATION BELOW KNEE;  Surgeon: Cammy CopaGregory Scott Dean, MD;  [facility];  Service: Orthopedics;  Laterality: Left;  . APPENDECTOMY      . CARDIAC CATHETERIZATION      . CORONARY ANGIOPLASTY WITH STENT PLACEMENT      . FEMORAL-TIBIAL BYPASS GRAFT Left 01/28/2014    Procedure:  FEMORAL-ANTERIOR TIBIAL ARTERY Bypass Graft utilizing composite gortex graft and vein graft;  Surgeon: Chuck Hinthristopher S Dickson, MD;  [facility];  Service: Vascular;  Laterality: Left;  . LOWER EXTREMITY ANGIOGRAM Left 01/26/2014    Procedure: LOWER EXTREMITY ANGIOGRAM;  Surgeon: Iran OuchMuhammad A Arida, MD;  Location: [facility] CATH LAB;  Service: Cardiovascular;  Laterality: Left;  . RIGHT/LEFT HEART CATH AND CORONARY ANGIOGRAPHY N/A 12/02/2017    Procedure: RIGHT/LEFT HEART CATH AND CORONARY ANGIOGRAPHY;  Surgeon: [name], [name], MD;  [facility];  Service: Cardiovascular;  Laterality: N/A;  . TEE WITHOUT CARDIOVERSION  N/A 03/28/2014    Procedure: TRANSESOPHAGEAL ECHOCARDIOGRAM (TEE);  Surgeon: [name], MD;  [facility];  Service: Cardiovascular;  Laterality: N/A;         Family History  Problem Relation Age of Onset  . Diabetes Mother    . Cancer Mother          Pancreatic  . Heart disease Mother          After age 80  . Hypertension Mother    . Hyperlipidemia Mother    . Diabetes Sister    . Cancer Sister    . Cancer Sister          Bone  . Cancer Father      Social History:  reports that he quit smoking about 41 years ago. His smoking use included pipe. He has never used smokeless tobacco. He reports that he does not drink alcohol and does not use drugs. Allergies:       Allergies  Allergen Reactions  . Cilostazol Other (See Comments)      Feels  like feet on fire / Pain in feet          Medications Prior to Admission  Medication Sig Dispense Refill  . amLODipine (NORVASC) 10 MG tablet Take 10 mg by mouth at bedtime.      [name] aspirin EC 81 MG tablet Take 81 mg by mouth daily. Swallow whole.      . cyanocobalamin (,VITAMIN B-12,) 1000 MCG/ML injection Inject 1,000 mcg into the muscle every 30 (thirty) days.      [name] escitalopram (LEXAPRO) 10 MG tablet Take 10 mg by mouth daily.      [name] ezetimibe (ZETIA) 10 MG tablet Take 1 tablet (10 mg total) by mouth daily. (Patient taking differently: Take 10 mg by mouth at bedtime.) 90 tablet 3  . furosemide (LASIX) 20 MG tablet Take 40 mg by mouth daily as needed for fluid or edema.      . Ibuprofen-Acetaminophen (ADVIL DUAL ACTION) 125-250 MG TABS Take 2 tablets by mouth at bedtime as needed (pain).      . insulin aspart (NOVOLOG FLEXPEN) 100 UNIT/ML FlexPen Inject 27 Units into the skin 2 (two) times daily.      . insulin detemir (LEVEMIR) 100 unit/ml SOLN Inject 27 Units into the skin 2 (two) times daily.      . isosorbide mononitrate (IMDUR) 30 MG 24 hr tablet Take 30 mg by mouth at bedtime.      [name] losartan (COZAAR) 100 MG tablet Take  100 mg by mouth daily.      . metFORMIN (GLUCOPHAGE) 1000 MG tablet Take 1,000 mg by mouth 2 (two) times daily with a meal.      . metoprolol (TOPROL-XL) 100 MG 24 hr tablet Take 100 mg by mouth daily.      . nitroGLYCERIN (NITROSTAT) 0.4 MG SL tablet Place 0.4 mg under the tongue every 5 (five) minutes as needed for chest pain.      . rosuvastatin (CRESTOR) 40 MG tablet Take 1 tablet (40 mg total) by mouth at bedtime. 90 tablet 3  . Vitamin D, Ergocalciferol, (DRISDOL) 1.25 MG (50000 UNIT) CAPS capsule Take 50,000 Units by mouth every 30 (thirty) days. On the 1st Monday of each month          Home: Home Living Family/patient expects to be discharged to:: Private residence Living Arrangements: Spouse/significant other Available Help at Discharge: Family,Available 24 hours/day Type of Home: House Home Access: Ramped entrance Home Layout: One level Bathroom Toilet: Standard Home Equipment: Cane - single point,Walker - 2 wheels,Walker - 4 wheels Additional Comments: wife present and reports pt normally has additional sleeves for prosthetic but didnt grab them in a rush to leave with EMS  Lives With: Spouse  Functional History: Prior Function Level of Independence: Independent with assistive device(s) ADL's / Homemaking Assistance Needed: normally completes all adls indep Comments: pt ambulates with use of cane vs RW, does require some assistance with bathing Functional Status:  Mobility: Bed Mobility Overal bed mobility: Needs Assistance Bed Mobility: Supine to Sit Supine to sit: Mod assist,HOB elevated General bed mobility comments: Pt using R bed rail to pull UB off bed surface and mod (A) to keep trunk off bed surface. pt requires mod (A) to bring R hip out to eob. Transfers Overall transfer level: Needs assistance Equipment used: Rolling walker (2 wheeled) Transfers: Sit to/from [employer] Sit to Stand: Min assist Stand pivot transfers: Min assist General  transfer comment: Pt from bed surface and hand over hand for R  UE Ambulation/Gait Ambulation/Gait assistance: Min assist Gait Distance (Feet): 20 Feet Assistive device: Rolling walker (2 wheeled) Gait Pattern/deviations: Step-to pattern General Gait Details: pt with shorrt step-to gait, reduced foot clearance bilaterally, increased trunk flexion with fatigue Gait velocity: reduced Gait velocity interpretation: <1.31 ft/sec, indicative of household ambulator   ADL: ADL Overall ADL's : Needs assistance/impaired Grooming: Maximal assistance Upper Body Bathing Details (indicate cue type and reason): wife completed bath on arrival max (A) Lower Body Dressing: Maximal assistance Lower Body Dressing Details (indicate cue type and reason): pt helping rolling sleeve onto LLE. pt requires total (A) to place prosthetic Toilet Transfer: Minimal assistance,RW General ADL Comments: pt very lethargic but following commands between arousals techniques. pt noted to have facial droop R side, speech slightly more slurred and decreased arousal. wife reports his face is more droopy. OT informed RN and in room at bedside   Cognition: Cognition Overall Cognitive Status: Impaired/Different from baseline Arousal/Alertness: Awake/alert Orientation Level: Oriented to person Attention: Sustained Sustained Attention: Impaired Sustained Attention Impairment: Verbal basic Memory: Impaired Memory Impairment: Retrieval deficit,Storage deficit (1/4 delayed recall, 4/4 immediate) Awareness: Impaired Awareness Impairment: Emergent impairment (appears intact for speech, not swallow) Problem Solving: Appears intact Executive Function: Self Monitoring Self Monitoring: Impaired Self Monitoring Impairment: Verbal basic Safety/Judgment: Appears intact Cognition Arousal/Alertness: Lethargic Behavior During Therapy: Flat affect Overall Cognitive Status: Impaired/Different from baseline Area of Impairment: Problem  solving Problem Solving: Slow processing General Comments: pt arouses to name call and following commands. pt opens eyes and says "hey darling" in response to therapist present   Blood pressure (!) 163/79, pulse (!) 105, temperature 98.9 F (37.2 C), temperature source Oral, resp. rate 16, height 5\' 9"  (1.753 m), weight 104.3 kg, SpO2 97 %. Physical Exam Vitals and nursing note reviewed.  Constitutional:      Appearance: He is obese.  HENT:     Head: Normocephalic and atraumatic.     Mouth/Throat:     Mouth: Mucous membranes are dry.     Pharynx: Oropharynx is clear.  Eyes:     Extraocular Movements: Extraocular movements intact.     Conjunctiva/sclera: Conjunctivae normal.     Pupils: Pupils are equal, round, and reactive to light.  Cardiovascular:     Rate and Rhythm: Normal rate and regular rhythm.     Heart sounds: Normal heart sounds.  Pulmonary:     Effort: Pulmonary effort is normal. No respiratory distress.     Breath sounds: Normal breath sounds. No wheezing.  Abdominal:     General: Abdomen is flat. Bowel sounds are normal. There is no distension.     Palpations: Abdomen is soft. There is no mass.  Musculoskeletal:        General: No swelling or tenderness.  Skin:    General: Skin is warm and dry.  Neurological:     Mental Status: He is alert and oriented to person, place, and time.     Cranial Nerves: Dysarthria present.     Motor: No tremor or abnormal muscle tone.     Comments: Patient is alert in no acute distress.  Mild dysarthria.  Oriented x3 and follows commands.  Fair awareness of deficits.  Sensation absent at the supramalleolar area on the right side and distal. Motor strength is 4 - at the right deltoid bicep tricep finger flexors and extensors 4 at the right hip flexor knee extensor and 4 - right ankle dorsiflexor Left upper extremity 5/5 in the deltoid bicep tricep finger flexors and extensors  5/5 in the left hip flexor       Lab Results Last 24  Hours       Results for orders placed or performed during the hospital encounter of 03/30/20 (from the past 24 hour(s))  Glucose, capillary     Status: Abnormal    Collection Time: 04/02/20  6:43 AM  Result Value Ref Range    Glucose-Capillary 260 (H) 70 - 99 mg/dL    Comment 1 Notify RN      Comment 2 Document in Chart    Glucose, capillary     Status: Abnormal    Collection Time: 04/02/20 11:31 AM  Result Value Ref Range    Glucose-Capillary 222 (H) 70 - 99 mg/dL  Glucose, capillary     Status: Abnormal    Collection Time: 04/02/20  4:17 PM  Result Value Ref Range    Glucose-Capillary 238 (H) 70 - 99 mg/dL  Glucose, capillary     Status: Abnormal    Collection Time: 04/02/20  9:07 PM  Result Value Ref Range    Glucose-Capillary 232 (H) 70 - 99 mg/dL      Imaging Results (Last 48 hours)  No results found.     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 Does the need for close, 24 hr/day medical supervision in concert with the patient's rehab needs make it unreasonable for this patient to be served in a less intensive setting? Yes 2. Co-Morbidities requiring supervision/potential complications: Left below-knee amputation, insulin-dependent diabetes mellitus, history of aortic stenosis, history of hypertension, chronic diastolic CHF, CKD 3 3. Due to bladder management, bowel management, safety, skin/wound care, disease management, medication administration, pain management and patient education, does the patient require 24 hr/day rehab nursing? Yes 4. Does the patient require coordinated care of a physician, rehab nurse, therapy disciplines of PT, OT, speech to address physical and functional deficits in the context of the above medical diagnosis(es)? Yes Addressing deficits in the following areas: balance, endurance, locomotion, strength, transferring, bowel/bladder control, bathing, dressing, toileting, cognition, speech and psychosocial  support 5. Can the patient actively participate in an intensive therapy program of at least 3 hrs of therapy per day at least 5 days per week? Yes 6. The potential for patient to make measurable gains while on inpatient rehab is good 7. Anticipated functional outcomes upon discharge from inpatient rehab are supervision  with PT, supervision with OT, supervision with SLP. 8. Estimated rehab length of stay to reach the above functional goals is: 10 to 14 days 9. Anticipated discharge destination: Home 10. Overall Rehab/Functional Prognosis: good   RECOMMENDATIONS: This patient's condition is appropriate for continued rehabilitative care in the following setting: CIR Patient has agreed to participate in recommended program. Yes Note that insurance prior authorization may be required for reimbursement for recommended care.   Comment:      Cathlyn Parsons, PA-C 04/03/2020    "I have personally performed a face to face diagnostic evaluation of this patient.  Additionally, I have reviewed and concur with the physician assistant's documentation above." Charlett Blake M.D. [facility] FAAPM&R (Neuromuscular Med) Diplomate Am Board of Electrodiagnostic Med Fellow Am Board of Interventional Pain              Revision History                        Routing History  Note Details  Author Erick Colace, MD File Time 04/03/2020  1:00 PM  Author Type Physician Status Signed  Last Editor Erick Colace, MD Service Physical Medicine and Rehabilitation  Hospital Acct # [id number] Admit Date 04/07/2020

## 2020-04-07 NOTE — Progress Notes (Signed)
Jordan Gong, RN  Rehab Admission Coordinator  Physical Medicine and Rehabilitation  PMR Pre-admission     Signed  Date of Service:  04/03/2020 11:32 AM      Related encounter: ED to Hosp-Admission (Discharged) from 03/30/2020 in [facility]       Signed          Show:Clear all '[x]' Manual'[x]' Template'[x]' Copied  Added by: '[x]' Jordan Gong, RN'[x]' Lind Covert, Jordan [name], CCC-SLP'[x]' Jordan [name], CCC-SLP   '[]' Hover for details  PMR Admission Coordinator Pre-Admission Assessment   Patient: Jordan [name] is an 80 y.o., male MRN: [id number] DOB: 02-09-1941 Height: '5\' 9"'  (175.3 cm) Weight: 104.3 kg                                                                                                                                                  Insurance Information HMO:     PPO: yes     PCP:      IPA:      80/20:      OTHER:  PRIMARY: [place]; not contracted   SECONDARY: [employer]      Policy#: [id number]      Subscriber: patient CM Name: approved by Jordan [name] with f/u by [facility] Phone#: [phone]     Fax#: clinical updates due to [facility] 5 days from Admission. Call Jordan [name] if admitted after 1/5. Clinical updates a's fax is  [phone] Pre-Cert#: [id number]  For 5 days    Employer Benefits:  Phone #: [email or web address] at [email or web address]     Name:  [name] Date: 04/02/19-present     Deduct: [place] not have for [place]      Out of Pocket Max: $4,000 ($0 met)      Life Max: NA  CIR: $160/day co-pay for days 1-10, $0/day co-pay for days 11-90 SNF: 100% for days 1-20, 0% co-insurance; $50 co-pay per day for days 21-100; after 100 days coverage pays $0 Outpatient: $20/visit co-pay pending service; visits limited to medical necessity     Co-Pay:  Home Health: 100% coverage, 0% co-insurance; limited by medical necessity      Co-Pay:  DME: 80%     Co-Pay: 20% Providers: TEFL teacher:       Phone#:    The Customer service manager" for patients in Inpatient Rehabilitation Facilities with attached "Privacy Act [place] Records" was provided and verbally reviewed with: Patient   Emergency Contact Information Contact Information     Name Relation Home Work [name] Spouse [phone]   [phone]       Current Medical History  Patient Admitting Diagnosis: 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 male with history of CAD maintained on aspirin, aortic stenosis, chronic diastolic congestive heart failure, insulin-dependent  diabetes mellitus hypertension hyperlipidemia, CKD stage III, carotid stenosis.  Per chart review lives with spouse independent with assistive device 1 level home ramped entrance.  Good family support.  Presented 03/31/2020 with gait abnormality slurred speech, left facial droop and multiple falls.  MRI showed small left gangliothalamic acute infarct no hemorrhage or mass-effect.  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  Negative for large vessel occlusion.  No evidence of hemodynamically significant ICA or vertebral artery stenosis in the neck.  Admission chemistries unremarkable except glucose 313, creatinine 1.43, hemoglobin 12.7, BNP 32, troponin 21-15.  Echocardiogram ejection fraction of 60 to 65% no wall motion abnormalities grade 1 diastolic dysfunction.  Currently maintained on aspirin and Plavix for CVA prophylaxis x3 weeks then Plavix alone.  Subcutaneous Lovenox for DVT prophylaxis.  Carb modified diet with nectar thick liquids.  Physical medicine rehab consult requested due to patient's decreased functional mobility and slurred speech.   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 He uses a cane when ambulating. His wife does assist him with donning sock and shoe on  the right side. The patient also needs assist washing his back and otherwise provided set up assistance for clothing. The patient was otherwise modified independent. He has not driven for a couple years. The patient notes visual problems due to his diabetes and has some difficulty reading although he can still read.   Complete NIHSS TOTAL: 1 Glasgow Coma Scale Score: 15   Past Medical History      Past Medical History:  Diagnosis Date  . Anemia    . Aortic stenosis    . CAD (coronary artery disease)      a. s/p IMI in past tx with POBA and cath 6 mos later with occluded RCA;  b. h/o Taxus DES to LAD and CFX;  c.  cath 1/10: LM ok, mLAD 40-50%, LAD stent ok, D2 tandem 60-70%, pCFX 30%, mAVCFX stent ok, pRCA 40%, RV 90%, RV marginal 90%, dRCA filled L-R collats tx medically  . Carotid stenosis      dopplers 5/11: 40-59% bilat  . CHF (congestive heart failure) ([place])    . Diabetic ulcer of left foot ([place])    . DM2 (diabetes mellitus, type 2) ([place])    . Heart murmur    . HLD (hyperlipidemia)    . HTN (hypertension)    . Myocardial infarction ([place]) 1995 and 2005    Heart Attack  . Osteomyelitis of left foot ([place])    . Staphylococcus aureus bacteremia with sepsis [facility])        Family History  family history includes Cancer in his father, mother, sister, and sister; Diabetes in his mother and sister; Heart disease in his mother; Hyperlipidemia in his mother; Hypertension in his mother.   Prior Rehab/Hospitalizations:  Has the patient had prior rehab or hospitalizations prior to admission? No   Has the patient had major surgery during 100 days prior to admission? No   Current Medications    Current Facility-Administered Medications:  .  acetaminophen (TYLENOL) tablet 650 mg, 650 mg, Oral, Q4H PRN **OR** acetaminophen (TYLENOL) 160 MG/5ML solution 650 mg, 650 mg, Per Tube, Q4H PRN **OR** acetaminophen (TYLENOL) suppository 650 mg, 650 mg, Rectal, Q4H PRN, [name], MD .  aspirin  EC tablet 81 mg, 81 mg, Oral, Daily, Rosalin Hawking, MD, 81 mg at 04/07/20 0905 .  Chlorhexidine Gluconate  Cloth 2 % PADS 6 each, 6 each, Topical, Daily, Kayleen Memos, DO, 6 each at 04/07/20 1610 .  clopidogrel (PLAVIX) tablet 75 mg, 75 mg, Oral, Daily, Rosalin Hawking, MD, 75 mg at 04/07/20 0905 .  enoxaparin (LOVENOX) injection 40 mg, 40 mg, Subcutaneous, Daily, [name], MD, 40 mg at 04/07/20 0906 .  ezetimibe (ZETIA) tablet 10 mg, 10 mg, Oral, QHS, Rosalin Hawking, MD, 10 mg at 04/06/20 2222 .  hydrALAZINE (APRESOLINE) injection 5 mg, 5 mg, Intravenous, Q6H PRN, Rai, Ripudeep K, MD .  insulin aspart (novoLOG) injection 0-20 Units, 0-20 Units, Subcutaneous, TID WC, [place] Eric J, DO, 4 Units at 04/07/20 [phone] .  insulin aspart (novoLOG) injection 0-5 Units, 0-5 Units, Subcutaneous, QHS, [name], MD, 3 Units at 04/04/20 2142 .  insulin aspart (novoLOG) injection 14 Units, 14 Units, Subcutaneous, TID WC, [place] Eric J, DO, [place] Units at 04/07/20 [phone] .  insulin glargine (LANTUS) injection 50 Units, 50 Units, Subcutaneous, BID, [place] Eric J, DO, 50 Units at 04/07/20 8119 .  metoprolol succinate (TOPROL-XL) 24 hr tablet 25 mg, 25 mg, Oral, Daily, Hall, Carole N, DO, 25 mg at 04/07/20 0905 .  polyethylene glycol (MIRALAX / GLYCOLAX) packet 17 g, 17 g, Oral, Daily PRN, Irene Pap N, DO, 17 g at 04/03/20 0836 .  [employer], , Oral, PRN, Kayleen Memos, DO .  rosuvastatin (CRESTOR) tablet 40 mg, 40 mg, Oral, Daily, Rosalin Hawking, MD, 40 mg at 04/07/20 0907 .  senna-docusate (Senokot-S) tablet 2 tablet, 2 tablet, Oral, BID, Kayleen Memos, DO, 2 tablet at 04/07/20 0905 .  vitamin B-12 (CYANOCOBALAMIN) tablet 1,000 mcg, 1,000 mcg, Oral, Daily, [name], MD, 1,000 mcg at 04/07/20 0905   Patients Current Diet:     Diet Order                      Diet - low sodium heart healthy              Diet Carb Modified Fluid consistency: Nectar Thick; Room service  appropriate? Yes  Diet effective now                      Precautions / Restrictions Precautions Precautions: Fall Precaution Comments: L prosthetic Other Brace: L prosthetic Restrictions Weight Bearing Restrictions: No    Has the patient had 2 or more falls or a fall with injury in the past year?Yes   Prior Activity Level Limited Community (1-2x/wk): trips to [employer] and church   Prior Functional Level Prior Function Level of Independence: Independent with assistive device(s) ADL's / Homemaking Assistance Needed: normally completes all adls indep Comments: pt ambulates with use of cane vs RW, does require some assistance with bathing   Self Care: Did the patient need help bathing, dressing, using the toilet or eating?  Needed some help   Indoor Mobility: Did the patient need assistance with walking from room to room (with or without device)? Independent   Stairs: Did the patient need assistance with internal or external stairs (with or without device)? Needed some help   Functional Cognition: Did the patient need help planning regular tasks such as shopping or remembering to take medications? Independent   Home Assistive Devices / Equipment Home Assistive Devices/Equipment: Prosthesis Home Equipment: Cane - single point,Walker - 2 wheels,Walker - 4 wheels   Prior Device Use: Indicate devices/aids used by the patient prior to current illness, exacerbation or injury? Cane. Occassionally used a  walker or rollator   Current Functional Level Cognition   Arousal/Alertness: Awake/alert Overall Cognitive Status: Impaired/Different from baseline Current Attention Level: Sustained Orientation Level: Oriented X4 Following Commands: Follows one step commands with increased time Safety/Judgement: Decreased awareness of safety,Decreased awareness of deficits General Comments: pt with slow response times this session, more alert this session, awake and eating his lunch. Pt oriented  x4 Attention: Sustained Sustained Attention: Impaired Sustained Attention Impairment: Verbal basic Memory: Impaired Memory Impairment: Retrieval deficit,Storage deficit (1/4 delayed recall, 4/4 immediate) Awareness: Impaired Awareness Impairment: Emergent impairment (appears intact for speech, not swallow) Problem Solving: Appears intact Executive Function: Self Monitoring Self Monitoring: Impaired Self Monitoring Impairment: Verbal basic Safety/Judgment: Appears intact    Extremity Assessment (includes Sensation/Coordination)   Upper Extremity Assessment: RUE deficits/detail RUE Deficits / Details: utilizing RUE to self-feed, weak greasp on fork and demonstrated decreased proprioception when scooping/stabbing food, demonstrated decreased fine motor coordination and poor inhand manipulation skills RUE Coordination: decreased fine motor  Lower Extremity Assessment: Defer to PT evaluation RLE Sensation: history of peripheral neuropathy LLE Deficits / Details: L BKA LLE Sensation: decreased light touch     ADLs   Overall ADL's : Needs assistance/impaired Eating/Feeding: Set up,Sitting,Bed level Eating/Feeding Details (indicate cue type and reason): pt eating lunch from tray after setup assistance;pt demonstrated fine motor limitations in RUE, required increased effort and time to scoop food. Grooming: Wash/dry face,Oral care,Sitting,Supervision/safety,Set up,Cueing for sequencing Grooming Details (indicate cue type and reason): cues for sequencing as pt lethargic during session, supervision- set up assist from EOB Upper Body Bathing Details (indicate cue type and reason): wife completed bath on arrival max (A) Lower Body Dressing: Maximal assistance Lower Body Dressing Details (indicate cue type and reason): pt helping rolling sleeve onto LLE. pt requires total (A) to place prosthetic Toilet Transfer: Minimal Technical brewer Details (indicate cue type and reason): defer  d/t level of arousal Functional mobility during ADLs: Maximal assistance,+2 for physical assistance (bed mobility only) General ADL Comments: pt declined mobility at this time requesting to finish his lunch     Mobility   Overal bed mobility: Needs Assistance Bed Mobility: Supine to Sit Supine to sit: HOB elevated,Mod assist Sit to supine: Max assist,+2 for physical assistance General bed mobility comments: modA to elevate trunk into sitting     Transfers   Overall transfer level: Needs assistance Equipment used: Rolling walker (2 wheeled),1 person hand held assist Transfer via Lift Equipment: St[place] Transfers: Sit to/from [employer] to Stand: Mod assist,From elevated surface Stand pivot transfers: Mod assist,Min assist General transfer comment: modA for sit to stand transfers, pt requires cues for hand placement and sequencing. MinA for step and turn transfers with RW initially, modA with fatigue     Ambulation / Gait / Stairs / Wheelchair Mobility   Ambulation/Gait Ambulation/Gait assistance: Herbalist (Feet): 20 Feet Assistive device: Rolling walker (2 wheeled) Gait Pattern/deviations: Step-to pattern General Gait Details: pt with short step-to gait, reduced foot clearance of RLE, increased trunk flexion Gait velocity: reduced Gait velocity interpretation: <1.31 ft/sec, indicative of household ambulator     Posture / Balance Dynamic Sitting Balance Sitting balance - Comments: reliant on UE support of bed and minG Balance Overall balance assessment: Needs assistance Sitting-balance support: Single extremity supported,Bilateral upper extremity supported,Feet supported Sitting balance-Leahy Scale: Poor Sitting balance - Comments: reliant on UE support of bed and minG Postural control: Right lateral lean Standing balance support: Bilateral upper extremity supported Standing balance-Leahy Scale: Poor Standing  balance comment: reliant on BUE  support of RW and minG-minA for static standing High Level Balance Comments: worked on maintaining midline in perched position     Special needs/care consideration Skin amputation: Left leg; Cracking: Right leg, Diabetic management novoLOG: 0-15 units 3x daily with meals; novoLOG: 7 units 3x daily with meals; Lantus: 27 units 2x/day; novoLOG: 0-5 units daily at bedtime, Bladder incontinence; external urinary catheter and Designated visitor Brannen Koppen, wife        Previous Home Environment  Living Arrangements: Spouse/significant other  Lives With: Spouse Available Help at Discharge: Family,Available 24 hours/day Type of Home: House Home Layout: One level Home Access: Ramped entrance Bathroom Shower/Tub: Multimedia programmer: Handicapped height Bathroom Accessibility: Yes How Accessible: Accessible via walker [place]: No Additional Comments: wife present and reports pt normally has additional sleeves for prosthetic but didnt grab them in a rush to leave with EMS   Discharge Living Setting Plans for Discharge Living Setting: Patient's home Type of Home at Discharge: House Discharge Home Layout: One level Discharge Home Access: [place] entrance Discharge Bathroom Shower/Tub: Walk-in shower Discharge Bathroom Toilet: Handicapped height Discharge Bathroom Accessibility: Yes How Accessible: Accessible via walker Does the patient have any problems obtaining your medications?: No   Social/Family/Support Systems Anticipated Caregiver: Natanael Saladin, wife Anticipated Caregiver's Contact Information: [phone] Caregiver Availability: 24/7 Discharge Plan Discussed with Primary Caregiver: Yes Is Caregiver In Agreement with Plan?: Yes Does Caregiver/Family have Issues with Lodging/Transportation while Pt is in Rehab?: No     Goals Patient/Family Goal for Rehab: supervision PT/OT/ST Expected length of stay: 10-14 days Pt/Family Agrees to Admission and willing  to participate: Yes Program Orientation Provided & Reviewed with Pt/Caregiver Including Roles  & Responsibilities: Yes     Decrease burden of Care through IP rehab admission: NA     Possible need for SNF placement upon discharge:NA     Patient Condition: This patient's medical and functional status has changed since the consult dated: 04/03/2020  in which the Rehabilitation Physician determined and documented that the patient's condition is appropriate for intensive rehabilitative care in an inpatient rehabilitation facility. See "History of Present Illness" (above) for medical update. Functional changes are: lethargy resolving, mod-max A with therapies. Patient's medical and functional status update has been discussed with the Rehabilitation physician and patient remains appropriate for inpatient rehabilitation. Will admit to inpatient rehab today.   Preadmission Screen Completed By: Clemens Catholic with updates by Cleatrice Burke, RN, 04/07/2020 11:02 AM ______________________________________________________________________   Discussed status with Dr. Naaman Plummer on 04/07/2020 at  1103 and received approval for admission today.   Admission Coordinator: Clemens Catholic with updates by  Cleatrice Burke, time 6503 Date 04/07/2020             Cosigned by: Meredith Staggers, MD at 04/07/2020 11:26 AM    Revision History                                       Note Details  Author Jordan Gong, RN File Time 04/07/2020 11:02 AM  Author Type Rehab Admission Coordinator Status Signed  Last Editor Jordan Gong, RN Service Physical Medicine and [place] # [phone] Admit Date 04/07/2020

## 2020-04-07 NOTE — TOC Transition Note (Signed)
Transition of Care [facility]) - CM/SW Discharge Note   Patient Details  Name: Jordan [name] MRN: [id number] Date of Birth: Dec 01, 1940  Transition of Care [facility] CM/SW Contact:  Kermit Balo, RN Phone Number: 04/07/2020, 11:44 AM   Clinical Narrative:    Pt is discharging to CIR today. CM signing off.    Final next level of care: IP Rehab Facility Barriers to Discharge: No Barriers Identified   Patient Goals and CMS Choice   CMS Medicare.gov Compare Post Acute Care list provided to:: Patient Choice offered to / list presented to : Patient  Discharge Placement                       Discharge Plan and Services                                     Social Determinants of Health (SDOH) Interventions     Readmission Risk Interventions No flowsheet data found.

## 2020-04-07 NOTE — Progress Notes (Signed)
Pt admitted to room 4W06. Oriented to floor and rehab policy. Denies any pain or discomfort at this time. Pt currently resting in bed with all needs within reach.   Marylu Lund, RN

## 2020-04-08 ENCOUNTER — Inpatient Hospital Stay (HOSPITAL_COMMUNITY): Payer: No Typology Code available for payment source | Admitting: Occupational Therapy

## 2020-04-08 ENCOUNTER — Inpatient Hospital Stay (HOSPITAL_COMMUNITY): Payer: No Typology Code available for payment source

## 2020-04-08 ENCOUNTER — Inpatient Hospital Stay (HOSPITAL_COMMUNITY): Payer: No Typology Code available for payment source | Admitting: Physical Therapy

## 2020-04-08 LAB — GLUCOSE, CAPILLARY
Glucose-Capillary: 156 mg/dL — ABNORMAL HIGH (ref 70–99)
Glucose-Capillary: 278 mg/dL — ABNORMAL HIGH (ref 70–99)
Glucose-Capillary: 242 mg/dL — ABNORMAL HIGH (ref 70–99)
Glucose-Capillary: 233 mg/dL — ABNORMAL HIGH (ref 70–99)

## 2020-04-08 NOTE — Evaluation (Addendum)
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 Anemia   . Aortic stenosis   . CAD (coronary artery disease)    a. s/p IMI in past tx with POBA and cath 6 mos later with occluded RCA;  b. h/o Taxus DES to LAD and CFX;  c.  cath 1/10: LM ok, mLAD 40-50%, LAD stent ok, D2 tandem 60-70%, pCFX 30%, mAVCFX stent ok, pRCA 40%, RV 90%, RV marginal 90%, dRCA filled L-R collats tx medically  . Carotid stenosis    dopplers 5/11: 40-59% bilat  . CHF (congestive heart failure) ([place])   . Diabetic ulcer of left foot ([place])   . DM2 (diabetes mellitus, type 2) ([place])   . Heart murmur   . HLD (hyperlipidemia)   . HTN (hypertension)   . Myocardial infarction ([place]) 1995 and 2005   Heart Attack  . Osteomyelitis of left foot ([place])   . Staphylococcus aureus bacteremia with sepsis [facility])    Past Surgical History:  Past Surgical History:  Procedure Laterality Date  . ABDOMINAL AORTAGRAM N/A 04/07/2013   Procedure: ABDOMINAL Maxcine Ham;  Surgeon: Wellington Hampshire, MD;  Location: [place] CATH LAB;  Service: Cardiovascular;  Laterality: N/A;  . ABDOMINAL AORTAGRAM N/A 01/26/2014   Procedure: ABDOMINAL Maxcine Ham;  Surgeon: Wellington Hampshire, MD;  Location: [place] CATH LAB;  Service: Cardiovascular;  Laterality: N/A;  . ABDOMINAL AORTAGRAM N/A 06/13/2014   Procedure: ABDOMINAL Maxcine Ham;  Surgeon: Angelia Mould, MD;  Location: [facility] CATH LAB;  Service: Cardiovascular;  Laterality: N/A;  .  AMPUTATION Left 01/28/2014   Procedure: AMPUTATION DIGIT- LEFT 5TH TOE;  Surgeon: Angelia Mould, MD;  Location: [place];  Service: Vascular;  Laterality: Left;  . AMPUTATION Left 02/02/2014   Procedure: Left 4th Ray Amputation vs Transmetatarsal Amputation;  Surgeon: Newt Minion, MD;  Location: [place];  Service: Orthopedics;  Laterality: Left;  . AMPUTATION Left 03/24/2014   Procedure: AMPUTATION BELOW KNEE;  Surgeon: Meredith Pel, MD;  Location: [place];  Service: Orthopedics;  Laterality: Left;  . APPENDECTOMY    . CARDIAC CATHETERIZATION    . CORONARY ANGIOPLASTY WITH STENT PLACEMENT    . FEMORAL-TIBIAL BYPASS GRAFT Left 01/28/2014   Procedure:  FEMORAL-ANTERIOR TIBIAL ARTERY Bypass Graft utilizing composite gortex graft and vein graft;  Surgeon: Angelia Mould, MD;  Location: [place];  Service: Vascular;  Laterality: Left;  . LOWER EXTREMITY ANGIOGRAM Left 01/26/2014   Procedure: LOWER EXTREMITY ANGIOGRAM;  Surgeon: Wellington Hampshire, MD;  Location: [place] CATH LAB;  Service: Cardiovascular;  Laterality: Left;  . RIGHT/LEFT HEART CATH AND CORONARY ANGIOGRAPHY N/A 12/02/2017   Procedure: RIGHT/LEFT HEART CATH AND CORONARY ANGIOGRAPHY;  Surgeon: Martinique, [name], MD;  Location: [place] CV LAB;  Service: Cardiovascular;  Laterality: N/A;  . 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 Per chart review lives with spouse independent with assistive device. His wife does assist with some basic ADLs. He has not driven for a  couple of years. 1 level home ramped entrance. Good family support. Presented 03/31/2020 with gait abnormality slurred speech left facial droop multiple falls. MRI showed small left gangliothalamicacute infarction no hemorrhage or mass-effect. 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 Negative for large vessel occlusion. No evidence of hemodynamically significant ICA or vertebral artery stenosis in the neck. Admission chemistries unremarkable except glucose 313 creatinine 1.43 hemoglobin 12.7 BNP 32 troponin 21-15. Echocardiogram with ejection fraction of 60 to 65% no wall motion abnormalities grade 1 diastolic dysfunction. Maintained on aspirin and Plavix x3 weeks then Plavix alone for CVA prophylaxis. Subcutaneous Lovenox for DVT prophylaxis. Carb modified diet with nectar liquids..  Patient transferred to CIR on 04/07/2020 .    Patient currently requires mod with basic self-care skills secondary to muscle weakness, decreased cardiorespiratoy endurance, decreased coordination and decreased motor planning, decreased awareness, decreased problem solving, decreased memory and delayed processing and decreased sitting balance, decreased standing balance and decreased balance strategies.  Prior to hospitalization, patient could complete ADLs with min.  Patient will benefit from skilled intervention to increase independence with basic self-care skills prior to discharge home with care partner.  Anticipate patient will require 24 hour supervision and follow up home health.  OT - End of Session Activity Tolerance: Tolerates 30+ min activity with multiple rests Endurance Deficit: Yes Endurance Deficit Description: Pt requires intermittent RBs during self care sitting EOB. OT Assessment Rehab Potential (ACUTE ONLY): Good OT Patient demonstrates impairments in the following area(s):  Balance;Safety;Cognition;Sensory;Skin Integrity;Endurance;Motor OT Basic ADL's Functional Problem(s): Grooming;Bathing;Dressing;Toileting OT Transfers Functional Problem(s): Toilet;Tub/Shower OT Additional Impairment(s): Fuctional Use of Upper Extremity OT Plan OT Intensity: Minimum of 1-2 x/day, 45 to 90 minutes OT Frequency: 5 out of 7 days OT Duration/Estimated Length of Stay: 2 weeks OT Treatment/Interventions: Balance/vestibular training;Discharge planning;Self Care/advanced ADL retraining;Therapeutic Activities;UE/LE Coordination activities;Cognitive remediation/compensation;Disease mangement/prevention;Functional mobility training;Patient/family education;Skin care/wound managment;Therapeutic Exercise;DME/adaptive equipment instruction;Neuromuscular re-education;Psychosocial support;UE/LE Strength taining/ROM;Wheelchair propulsion/positioning OT Basic Self-Care Anticipated Outcome(s): supervision OT Toileting Anticipated Outcome(s): supervision OT Bathroom Transfers Anticipated Outcome(s): supervision OT Recommendation Patient destination: Home Follow Up Recommendations: Home health OT Equipment Recommended: To be determined Equipment Details: Pt reports he owns a shower chair and a RW.   OT Evaluation Precautions/Restrictions  Precautions Precautions: Fall Precaution Comments: L prosthetic Restrictions Weight Bearing Restrictions: No General Chart Reviewed: Yes Pain Pain Assessment Pain Scale: 0-10 Pain Score: 0-No pain Home Living/Prior Functioning Home Living Available Help at Discharge: Family,Available 24 hours/day Type of Home: House Home Access: Ramped entrance Home Layout: One level Bathroom Shower/Tub: Multimedia programmer: Handicapped height Bathroom Accessibility: Yes  Lives With: Spouse Prior Function Level of Independence: Requires assistive device for independence,Needs assistance with ADLs,Needs assistance with homemaking  Able to Take  Stairs?: Yes Driving: No Vocation: Retired Comments: Pt reports, wife helps with LB dressing/bathing PLOF Vision Baseline Vision/History: Wears glasses Wears Glasses: At all times Patient Visual Report: No change from baseline Vision Assessment?: No apparent visual deficits Perception  Perception: Within Functional Limits Praxis Praxis: Intact Cognition Overall Cognitive Status: Impaired/Different from baseline Arousal/Alertness: Awake/alert Orientation Level: Person;Place;Situation Person: Oriented Place: Oriented  Situation: Oriented Year: 2022 Month: January Day of Week: Correct Memory: Impaired Memory Impairment: Retrieval deficit;Storage deficit Immediate Memory Recall: Sock;Blue;Bed Memory Recall Sock: Without Cue Memory Recall Blue: Without Cue Memory Recall Bed: Not able to recall Attention: Sustained;Alternating Sustained Attention: Appears intact Awareness: Impaired Awareness Impairment: Emergent impairment Problem Solving: Impaired Problem Solving Impairment: Functional basic Executive Function: Sequencing;Initiating Sequencing: Appears intact Initiating: Appears intact Self Monitoring: Appears intact Safety/Judgment: Appears intact Sensation Sensation Light Touch: Impaired Detail Peripheral sensation comments: 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 Pt reports tingling in bilateral hands Light Touch Impaired Details: Impaired LLE;Impaired RLE Hot/Cold: Appears Intact Proprioception: Appears Intact Stereognosis: Not tested Coordination Gross Motor Movements are Fluid and Coordinated: No Fine Motor Movements are Fluid and Coordinated: Yes Coordination and Movement Description: decreased speed of movements in all 4 extermities, Finger Nose Finger Test: intact BUE Motor  Motor Motor: Within Functional Limits  Trunk/Postural Assessment  Cervical Assessment Cervical Assessment: Within Functional Limits Thoracic  Assessment Thoracic Assessment: Exceptions to [facility] (rounded shoulders) Lumbar Assessment Lumbar Assessment: Exceptions to [facility] (mild posterior pelvic tilt) Postural Control Postural Control: Deficits on evaluation Righting Reactions: delayed in standing; posterior weight shifting preference  Balance Balance Balance Assessed: Yes Static Sitting Balance Static Sitting - Balance Support: Feet supported Static Sitting - Level of Assistance: 5: Stand by assistance Dynamic Sitting Balance Dynamic Sitting - Balance Support: Feet supported Dynamic Sitting - Level of Assistance: 4: Min assist Static Standing Balance Static Standing - Balance Support: Bilateral upper extremity supported Static Standing - Level of Assistance: 4: Min assist Dynamic Standing Balance Dynamic Standing - Balance Support: Bilateral upper extremity supported Dynamic Standing - Level of Assistance: 3: Mod assist Extremity/Trunk Assessment RUE Assessment RUE Assessment: Exceptions to [facility] Passive Range of Motion (PROM) Comments: WNL Active Range of Motion (AROM) Comments: WFL General Strength Comments: RUE 4-/5 LUE Assessment LUE Assessment: Within Functional Limits  Care Tool Care Tool Self Care Eating   Eating Assist Level: Supervision/Verbal cueing    Oral Care    Oral Care Assist Level: Supervision/Verbal cueing    Bathing         Assist Level: Moderate Assistance - Patient 50 - 74%    Upper Body Dressing(including orthotics)   What is the patient wearing?: Pull over shirt   Assist Level: Contact Guard/Touching assist    Lower Body Dressing (excluding footwear)   What is the patient wearing?: Incontinence brief;Pants Assist for lower body dressing: Moderate Assistance - Patient 50 - 74%    Putting on/Taking off footwear   What is the patient wearing?: Socks;Shoes;Orthosis (prosthesis) Assist for footwear: Moderate Assistance - Patient 50 - 74%       Care Tool Toileting Toileting activity    Assist for toileting: Moderate Assistance - Patient 50 - 74%     Care Tool Bed Mobility Roll left and right activity        Sit to lying activity        Lying to sitting edge of bed activity         Care Tool Transfers Sit to stand transfer        Chair/bed transfer         Toilet transfer   Assist Level: Minimal Assistance - Patient > 75%     Care Tool Cognition Expression of Ideas and Wants Expression of Ideas and Wants: Some difficulty - exhibits some difficulty with expressing needs and ideas (e.g, some words or finishing thoughts) or  speech is not clear   Understanding Verbal and Non-Verbal Content Understanding Verbal and Non-Verbal Content: Usually understands - understands most conversations, but misses some part/intent of message. Requires cues at times to understand   Memory/Recall Ability *first 3 days only Memory/Recall Ability *first 3 days only: Current season;Location of own room;That he or she is in a hospital/hospital unit    Refer to Care Plan for [place] 1 OT Short Term Goal 1 (Week 1): Pt will complete LB dressing with min assist using AE as needed. OT Short Term Goal 2 (Week 1): Pt will complete UB/LB bathing with min assist using shower bench and AE as needed. OT Short Term Goal 3 (Week 1): Pt will complete toileting with min assist. OT Short Term Goal 4 (Week 1): Pt will complete toilet transfer with CGA.  Recommendations for other services: None    Skilled Therapeutic Intervention ADL ADL Grooming: Supervision/safety Where Assessed-Grooming: Sitting at sink Upper Body Bathing: Minimal assistance Where Assessed-Upper Body Bathing: Edge of bed Lower Body Bathing: Moderate assistance Where Assessed-Lower Body Bathing: Edge of bed Upper Body Dressing: Setup Where Assessed-Upper Body Dressing: Edge of bed Lower Body Dressing: Moderate assistance Where Assessed-Lower Body Dressing: Edge of bed (sit/stand level  using RW) Mobility  Bed Mobility Bed Mobility: Rolling Right;Right Sidelying to Sit Rolling Right: Supervision/verbal cueing Right Sidelying to Sit: Moderate Assistance - Patient 50-74% Transfers Sit to Stand: Minimal Assistance - Patient > 75% Stand to Sit: Minimal Assistance - Patient > 75%  Skilled Intervention:  Pt semi upright in bed, no c/o pain, agreeable to OT session.  Initial evaluation complete per above.  Educated pt regarding OT scope of practice, and collaborated with pt regarding OT POC.  Self care and functional mobility completed per above levels of assist required. Assessed vitals at rest and were the following: BP: 141/65, SPO2 on RA 99%, pulse 84 bpm.  Pt requesting to toilet at end of session, therefore nurse tech educated on pt's current functional and mobility status to ensure safe assist with toileting.  Pt sitting in w/c with nurse tech present at end of session.    Discharge Criteria: Patient will be discharged from OT if patient refuses treatment 3 consecutive times without medical reason, if treatment goals not met, if there is a change in medical status, if patient makes no progress towards goals or if patient is discharged from hospital.  The above assessment, treatment plan, treatment alternatives and goals were discussed and mutually agreed upon: by patient  Ezekiel Slocumb 04/08/2020, 12:41 PM

## 2020-04-08 NOTE — Evaluation (Signed)
Speech Language Pathology Assessment and Plan  Patient Details  Name: Jordan [name] MRN: [id number] Date of Birth: 1940-12-13  SLP Diagnosis: Voice disorder;Cognitive Impairments;Dysphagia  Rehab Potential: Good ELOS: 17-20 days    Today's Date: 04/08/2020 SLP Individual Time:  815-900 and 920-930. 110 minutes      Hospital Problem: Principal Problem:   Infarction of left basal ganglia ([place]) Active Problems:   Diabetes mellitus type 2 in obese [facility])  Past Medical History:  Past Medical History:  Diagnosis Date  . Anemia   . Aortic stenosis   . CAD (coronary artery disease)    a. s/p IMI in past tx with POBA and cath 6 mos later with occluded RCA;  b. h/o Taxus DES to LAD and CFX;  c.  cath 1/10: LM ok, mLAD 40-50%, LAD stent ok, D2 tandem 60-70%, pCFX 30%, mAVCFX stent ok, pRCA 40%, RV 90%, RV marginal 90%, dRCA filled L-R collats tx medically  . Carotid stenosis    dopplers 5/11: 40-59% bilat  . CHF (congestive heart failure) ([place])   . Diabetic ulcer of left foot ([place])   . DM2 (diabetes mellitus, type 2) ([place])   . Heart murmur   . HLD (hyperlipidemia)   . HTN (hypertension)   . Myocardial infarction ([place]) 1995 and 2005   Heart Attack  . Osteomyelitis of left foot ([place])   . Staphylococcus aureus bacteremia with sepsis [facility])    Past Surgical History:  Past Surgical History:  Procedure Laterality Date  . ABDOMINAL AORTAGRAM N/A 04/07/2013   Procedure: ABDOMINAL Maxcine Ham;  Surgeon: Wellington Hampshire, MD;  Location: [place] CATH LAB;  Service: Cardiovascular;  Laterality: N/A;  . ABDOMINAL AORTAGRAM N/A 01/26/2014   Procedure: ABDOMINAL Maxcine Ham;  Surgeon: Wellington Hampshire, MD;  Location: [place] CATH LAB;  Service: Cardiovascular;  Laterality: N/A;  . ABDOMINAL AORTAGRAM N/A 06/13/2014   Procedure: ABDOMINAL Maxcine Ham;  Surgeon: Angelia Mould, MD;  Location: [facility] CATH LAB;  Service: Cardiovascular;  Laterality: N/A;  . AMPUTATION Left 01/28/2014   Procedure: AMPUTATION DIGIT-  LEFT 5TH TOE;  Surgeon: Angelia Mould, MD;  Location: [place];  Service: Vascular;  Laterality: Left;  . AMPUTATION Left 02/02/2014   Procedure: Left 4th Ray Amputation vs Transmetatarsal Amputation;  Surgeon: Newt Minion, MD;  Location: [place];  Service: Orthopedics;  Laterality: Left;  . AMPUTATION Left 03/24/2014   Procedure: AMPUTATION BELOW KNEE;  Surgeon: Meredith Pel, MD;  Location: [place];  Service: Orthopedics;  Laterality: Left;  . APPENDECTOMY    . CARDIAC CATHETERIZATION    . CORONARY ANGIOPLASTY WITH STENT PLACEMENT    . FEMORAL-TIBIAL BYPASS GRAFT Left 01/28/2014   Procedure:  FEMORAL-ANTERIOR TIBIAL ARTERY Bypass Graft utilizing composite gortex graft and vein graft;  Surgeon: Angelia Mould, MD;  Location: [place];  Service: Vascular;  Laterality: Left;  . LOWER EXTREMITY ANGIOGRAM Left 01/26/2014   Procedure: LOWER EXTREMITY ANGIOGRAM;  Surgeon: Wellington Hampshire, MD;  Location: [place] CATH LAB;  Service: Cardiovascular;  Laterality: Left;  . RIGHT/LEFT HEART CATH AND CORONARY ANGIOGRAPHY N/A 12/02/2017   Procedure: RIGHT/LEFT HEART CATH AND CORONARY ANGIOGRAPHY;  Surgeon: Martinique, [name], MD;  Location: [place] CV LAB;  Service: Cardiovascular;  Laterality: N/A;  . 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 Per chart review lives with spouse independent with assistive device. His wife does assist with some basic ADLs. He has not driven for a couple of years. 1 level home ramped entrance.  Good family support. Presented 03/31/2020 with gait abnormality slurred speech left facial droop multiple falls. MRI showed small left gangliothalamicacute infarction no hemorrhage or mass-effect. 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 Negative for large vessel occlusion. No evidence of hemodynamically significant ICA or vertebral artery stenosis in the neck. Admission chemistries unremarkable except glucose 313 creatinine 1.43 hemoglobin 12.7 BNP 32 troponin 21-15. Echocardiogram with ejection fraction of 60 to 65% no wall motion abnormalities grade 1 diastolic dysfunction. Maintained on aspirin and Plavix x3 weeks then Plavix alone for CVA prophylaxis. Subcutaneous Lovenox for DVT prophylaxis. Carb modified diet with nectar liquids. Physical medicine rehabilitation consulted due to patient's decrease in functional ability and slurred speech and was admitted for a comprehensive rehab program.  Pt presents with mild-moderate cognitive impairments, deficits include delayed processing, mildly complex problem solving, short term recall, sustained attention, emergent awareness, further impacted by fatigue. Formal cognitive linguistic assessment utilizing SLUMS, pt scored 20 out 30 (n=>27) and subsection of construction task from [employer] support the above-mentioned deficits. Pt presents with mild-moderate impairments in coordinating respiration and phonation, resulting in a breathy vocal quality with reduced vocal intensity at the phrase level. Pt is 90% intelligible at the phrase level. Oral motor examination noted mild reduction in lingual ROM and strength on right side. Pt consumed thin liquids (via TSP, cup and straw) and nectar thick liquids via cup with appropriate anterior containment, piecemeal swallowing and no overt s/s aspiration. MBS completed on 04/03/20 noted moderate sensed and silent  aspiration on thin liquids. Consumption of regular textures resulted in very mild lingual residue which was cleared with a liquid wash. SLP recommends continuing regular textures and nectar thick liquid diet, with intermittent supervision (for small bites/sips, intermittent throat clear and assistance if needed). Pt would benefit from skilled ST services in order to maximize functional independence and reduce burden of care, requiring supervision at discharge with continued skilled ST services.   Skilled Therapeutic Interventions          Skilled ST services focused on cognitive skills. SLP facilitated administration of cognitive linguistic formal assessment and provided education of results. SLP and pt collaborated to set goals for cognitive linguistic needs during length of stay. All questions answered to satisfaction.  Pt was left in room with call bell within reach and bed alarm set. SLP recommends to continue skilled services.  SLP Assessment  Patient will need skilled Speech Lanaguage Pathology Services during CIR admission    Recommendations  SLP Diet Recommendations: Age appropriate regular solids;Nectar Liquid Administration via: Cup;Straw Medication Administration: Whole meds with liquid Supervision: Patient able to self feed Compensations: Slow rate;Small sips/bites;Clear throat intermittently Postural Changes and/or Swallow Maneuvers: Seated upright 90 degrees Oral Care Recommendations: Oral care BID Patient destination: Home Follow up Recommendations: Home Health SLP;Outpatient SLP;24 hour supervision/assistance Equipment Recommended: None recommended by SLP    SLP Frequency 3 to 5 out of 7 days   SLP Duration  SLP Intensity  SLP Treatment/Interventions 17-20 days  Minumum of 1-2 x/day, 30 to 90 minutes  Cueing hierarchy;Functional tasks;Medication managment;Cognitive remediation/compensation;Environmental controls;Internal/external  aids;Patient/family education     Pain Pain Assessment Pain Score: 0-No pain  Prior Functioning Cognitive/Linguistic Baseline: Within functional limits Type of Home: House  Lives With: Spouse Available Help at Discharge: Family;Available 24 hours/day Vocation: Retired  Programmer, systems Overall Cognitive Status: Impaired/Different from baseline Arousal/Alertness: Lethargic Orientation Level: Oriented X4 Attention: Sustained Sustained Attention: Impaired Sustained Attention Impairment: Verbal basic;Functional basic (impacted by fatigue) Memory: Impaired Memory Impairment: Retrieval deficit;Storage deficit Awareness: Impaired Awareness Impairment: Emergent impairment Problem Solving: Impaired Problem Solving Impairment:  (mildly complex) Safety/Judgment: Appears intact  Comprehension Auditory Comprehension Overall Auditory Comprehension: Appears within functional limits for tasks assessed Expression Expression Primary Mode of Expression: Verbal Verbal Expression Overall Verbal Expression: Appears within functional limits for tasks assessed Oral Motor Oral Motor/Sensory Function Overall Oral Motor/Sensory Function: Mild impairment Facial ROM: Reduced right Facial Symmetry: Abnormal symmetry right Lingual ROM: Reduced right Lingual Symmetry: Within Functional Limits Lingual Strength: Reduced Lingual Sensation: Within Functional Limits Velum: Within Functional Limits Mandible: Within Functional Limits Motor Speech Overall Motor Speech: Impaired Respiration: Impaired Level of Impairment: Phrase Phonation: Low vocal intensity;Breathy Resonance: Within functional limits Articulation: Within functional limitis Intelligibility: Intelligibility reduced Word: 75-100% accurate Phrase: 75-100% accurate Sentence: 75-100% accurate Conversation: 25-49% accurate Motor Planning: Witnin functional limits Motor Speech Errors: Aware Effective Techniques: Slow rate;Increased vocal  intensity;Over-articulate  Care Tool Care Tool Cognition Expression of Ideas and Wants Expression of Ideas and Wants: Some difficulty - exhibits some difficulty with expressing needs and ideas (e.g, some words or finishing thoughts) or speech is not clear   Understanding Verbal and Non-Verbal Content Understanding Verbal and Non-Verbal Content: Usually understands - understands most conversations, but misses some part/intent of message. Requires cues at times to understand   Memory/Recall Ability *first 3 days only       PMSV Assessment  PMSV Trial Intelligibility: Intelligibility reduced Word: 75-100% accurate Phrase: 75-100% accurate Sentence: 75-100% accurate Conversation: 25-49% accurate  Bedside Swallowing Assessment General Previous Swallow Assessment: MBS 1/3 Nectar thick and regular textures Diet Prior to this Study: Regular;Nectar-thick liquids Respiratory Status: Room air History of Recent Intubation: No Behavior/Cognition: Alert;Cooperative Oral Cavity - Dentition: Adequate natural dentition Self-Feeding Abilities: Able to feed self Vision: Functional for self-feeding Patient Positioning: Upright in bed Baseline Vocal Quality: Low vocal intensity Volitional Cough: Strong Volitional Swallow: Able to elicit  Oral Care Assessment Does patient have any of the following "high(er) risk" factors?: None of the above Does patient have any of the following "at risk" factors?: None of the above Patient is LOW RISK: Follow universal precautions (see row information) Ice Chips Ice chips: Not tested Thin Liquid Thin Liquid: Impaired Presentation: Cup;Straw;Spoon Pharyngeal  Phase Impairments: Multiple swallows Other Comments: However silent and sensed aspiration noted on MBS Nectar Thick Presentation: Cup;Self Fed Honey Thick Honey Thick Liquid: Not tested Puree Puree: Not tested Solid Solid: Within functional limits Presentation: Self Fed BSE Assessment Risk for  Aspiration Impact on safety and function: Mild aspiration risk Other Related Risk Factors: Lethargy  Short Term Goals: Week 1: SLP Short Term Goal 1 (Week 1): Pt will participate in pharyngeal exercises (Mendelsohn and Supraglottic swallow) to increse timing of swallow initation and consume trials of thin liquids with vital s/s remaining WFL and minimal overt s/s aspiration prior to repeat instrumental swallow assessment. SLP Short Term Goal 2 (Week 1): Pt will demonstrate sustained attention in 30 minute intervals with supervision A verbal cues for redrirection to task. SLP Short Term Goal 3 (Week 1): Pt will coordinate phonation and respiration  at the phrase level with supervision A verbal cues. SLP Short Term Goal 4 (Week 1): Pt will complete functional mildly complex problem solving tasks with min A verbal cues. SLP Short Term Goal 5 (Week 1): Pt will self-monitor and self-correct functional errors with supervision A verbal cues. SLP Short Term Goal 6 (Week 1): Pt will demonstrate recall of novel information with supervision A verbal cues.  Refer to Care Plan for Long Term Goals  Recommendations for other services: None   Discharge Criteria: Patient will be discharged from SLP if patient refuses treatment 3 consecutive times without medical reason, if treatment goals not met, if there is a change in medical status, if patient makes no progress towards goals or if patient is discharged from hospital.  The above assessment, treatment plan, treatment alternatives and goals were discussed and mutually agreed upon: by patient  Yaslyn Cumby  [facility] 04/08/2020, 5:13 PM

## 2020-04-08 NOTE — Plan of Care (Signed)
  Problem: RH BLADDER ELIMINATION Goal: RH STG MANAGE BLADDER WITH ASSISTANCE Description: STG Manage Bladder With mod I Assistance Outcome: Not Progressing; incontinent . Using condom cath

## 2020-04-08 NOTE — Evaluation (Signed)
Physical Therapy Assessment and Plan  Patient Details  Name: Jordan [name] MRN: [id number] Date of Birth: 1941-03-04  PT Diagnosis: Abnormal posture, Abnormality of gait, Coordination disorder and Impaired cognition Rehab Potential: Good ELOS: 16-20 days   Today's Date: 04/08/2020 PT Individual Time: 1105-1200 PT Individual Time Calculation (min): 55 min    Hospital Problem: Principal Problem:   Infarction of left basal ganglia ([place]) Active Problems:   Diabetes mellitus type 2 in obese [facility])   Past Medical History:  Past Medical History:  Diagnosis Date  . Anemia   . Aortic stenosis   . CAD (coronary artery disease)    a. s/p IMI in past tx with POBA and cath 6 mos later with occluded RCA;  b. h/o Taxus DES to LAD and CFX;  c.  cath 1/10: LM ok, mLAD 40-50%, LAD stent ok, D2 tandem 60-70%, pCFX 30%, mAVCFX stent ok, pRCA 40%, RV 90%, RV marginal 90%, dRCA filled L-R collats tx medically  . Carotid stenosis    dopplers 5/11: 40-59% bilat  . CHF (congestive heart failure) ([place])   . Diabetic ulcer of left foot ([place])   . DM2 (diabetes mellitus, type 2) ([place])   . Heart murmur   . HLD (hyperlipidemia)   . HTN (hypertension)   . Myocardial infarction ([place]) 1995 and 2005   Heart Attack  . Osteomyelitis of left foot ([place])   . Staphylococcus aureus bacteremia with sepsis [facility])    Past Surgical History:  Past Surgical History:  Procedure Laterality Date  . ABDOMINAL AORTAGRAM N/A 04/07/2013   Procedure: ABDOMINAL Maxcine Ham;  Surgeon: Wellington Hampshire, MD;  Location: [place] CATH LAB;  Service: Cardiovascular;  Laterality: N/A;  . ABDOMINAL AORTAGRAM N/A 01/26/2014   Procedure: ABDOMINAL Maxcine Ham;  Surgeon: Wellington Hampshire, MD;  Location: [place] CATH LAB;  Service: Cardiovascular;  Laterality: N/A;  . ABDOMINAL AORTAGRAM N/A 06/13/2014   Procedure: ABDOMINAL Maxcine Ham;  Surgeon: Angelia Mould, MD;  Location: [facility] CATH LAB;  Service: Cardiovascular;  Laterality: N/A;  . AMPUTATION Left  01/28/2014   Procedure: AMPUTATION DIGIT- LEFT 5TH TOE;  Surgeon: Angelia Mould, MD;  Location: [place];  Service: Vascular;  Laterality: Left;  . AMPUTATION Left 02/02/2014   Procedure: Left 4th Ray Amputation vs Transmetatarsal Amputation;  Surgeon: Newt Minion, MD;  Location: [place];  Service: Orthopedics;  Laterality: Left;  . AMPUTATION Left 03/24/2014   Procedure: AMPUTATION BELOW KNEE;  Surgeon: Meredith Pel, MD;  Location: [place];  Service: Orthopedics;  Laterality: Left;  . APPENDECTOMY    . CARDIAC CATHETERIZATION    . CORONARY ANGIOPLASTY WITH STENT PLACEMENT    . FEMORAL-TIBIAL BYPASS GRAFT Left 01/28/2014   Procedure:  FEMORAL-ANTERIOR TIBIAL ARTERY Bypass Graft utilizing composite gortex graft and vein graft;  Surgeon: Angelia Mould, MD;  Location: [place];  Service: Vascular;  Laterality: Left;  . LOWER EXTREMITY ANGIOGRAM Left 01/26/2014   Procedure: LOWER EXTREMITY ANGIOGRAM;  Surgeon: Wellington Hampshire, MD;  Location: [place] CATH LAB;  Service: Cardiovascular;  Laterality: Left;  . RIGHT/LEFT HEART CATH AND CORONARY ANGIOGRAPHY N/A 12/02/2017   Procedure: RIGHT/LEFT HEART CATH AND CORONARY ANGIOGRAPHY;  Surgeon: Martinique, [name], MD;  Location: [place] CV LAB;  Service: Cardiovascular;  Laterality: N/A;  . 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 Per chart review lives with spouse independent with assistive device. His wife does assist with some basic ADLs. He has not driven for a couple of years. 1 level  home ramped entrance. Good family support. Presented 03/31/2020 with gait abnormality slurred speech left facial droop multiple falls. MRI showed small left gangliothalamicacute infarction no hemorrhage or mass-effect. 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 Negative for large vessel occlusion. No evidence of hemodynamically significant ICA or vertebral artery stenosis in the neck. Admission chemistries unremarkable except glucose 313 creatinine 1.43 hemoglobin 12.7 BNP 32 troponin 21-15. Echocardiogram with ejection fraction of 60 to 65% no wall motion abnormalities grade 1 diastolic dysfunction. Patient transferred to CIR on 04/07/2020 .   Patient currently requires mod with mobility secondary to muscle weakness and muscle joint tightness, decreased cardiorespiratoy endurance, decreased coordination and decreased motor planning, decreased midline orientation, decreased attention to left and ideational apraxia, decreased initiation, decreased attention, decreased awareness, decreased problem solving, decreased safety awareness and delayed processing and decreased sitting balance, decreased standing balance, decreased postural control and decreased balance strategies.  Prior to hospitalization, patient was modified independent  with mobility and lived with Spouse in a House home.  Home access is  Ramped entrance.  Patient will benefit from skilled PT intervention to maximize safe functional mobility, minimize fall risk and decrease caregiver burden for planned discharge home with 24 hour supervision.  Anticipate patient will benefit from follow up [place] at discharge.  PT - End of Session Activity Tolerance: Tolerates < 10 min activity, no significant change in vital signs Endurance Deficit: Yes Endurance Deficit Description: Pt requires intermittent RBs during self care sitting EOB. PT Assessment Rehab  Potential (ACUTE/IP ONLY): Good PT Barriers to Discharge: Decreased caregiver support;Home environment access/layout;Insurance for SNF coverage;Behavior PT Patient demonstrates impairments in the following area(s): Balance;Behavior;Edema;Motor;Endurance;Nutrition;Perception;Safety;Sensory;Skin Integrity PT Transfers Functional Problem(s): Bed Mobility;Bed to Chair;Car;Furniture PT Locomotion Functional Problem(s): Ambulation;Wheelchair Mobility;Stairs PT Plan PT Intensity: Minimum of 1-2 x/day ,45 to 90 minutes PT Frequency: 5 out of 7 days PT Duration Estimated Length of Stay: 16-20 days PT Treatment/Interventions: Ambulation/gait training;Balance/vestibular training;Cognitive remediation/compensation;Community reintegration;Discharge planning;DME/adaptive equipment instruction;Disease management/prevention;Neuromuscular re-education;Functional mobility training;Patient/family education;Pain management;Skin care/wound management;Psychosocial support;Splinting/orthotics;Stair training;Therapeutic Activities;Therapeutic Exercise;UE/LE Strength taining/ROM;Wheelchair propulsion/positioning;Visual/perceptual remediation/compensation;UE/LE Coordination activities PT Transfers Anticipated Outcome(s): Supervision assist with LRAD PT Locomotion Anticipated Outcome(s): supevision assist ambulatory with LRAD PT Recommendation Follow Up Recommendations: Home health PT Patient destination: Home Equipment Recommended: Wheelchair cushion (measurements);Wheelchair (measurements);To be determined   PT Evaluation Precautions/Restrictions Precautions Precautions: Fall Precaution Comments: L prosthetic Restrictions Weight Bearing Restrictions: No General   Vital Signs Pain Pain Assessment Pain Scale: 0-10 Pain Score: 0-No pain Home Living/Prior Functioning Home Living Available Help at Discharge: Family;Available 24 hours/day Type of Home: House Home Access: Ramped entrance Home Layout: One  level Bathroom Shower/Tub: Multimedia programmer: Handicapped height Bathroom Accessibility: Yes  Lives With: Spouse Prior Function Level of Independence: Requires assistive device for independence;Needs assistance with ADLs;Needs assistance with homemaking  Able to Take Stairs?: Yes Driving: No Vocation: Retired Comments: Pt reports, wife helps with LB dressing/bathing PLOF Vision/Perception  Vision - Assessment Additional Comments: noted to have Lside inattention when reaching of RW. WFL with testing visual fields Perception Perception: Within Functional Limits Inattention/Neglect: Does not attend  to left visual field Praxis Praxis: Intact Praxis Impairment Details: Motor planning Praxis-Other Comments: mild motor planning deficits  Cognition Overall Cognitive Status: Impaired/Different from baseline Arousal/Alertness: Awake/alert Orientation Level: Oriented X4 Attention: Sustained;Alternating Sustained Attention: Appears intact Memory: Impaired Memory Impairment: Retrieval deficit;Storage deficit Immediate Memory Recall: Sock;Blue;Bed Memory Recall Sock: Without Cue Memory Recall Blue: Without Cue Memory Recall Bed: Not able to recall Awareness: Impaired Awareness Impairment: Emergent impairment Problem Solving: Impaired Problem Solving Impairment: Functional basic Executive Function: Sequencing;Initiating Sequencing: Appears intact Initiating: Appears intact Self Monitoring: Appears intact Safety/Judgment: Appears intact Sensation Sensation Light Touch: Impaired Detail Peripheral sensation comments: 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 Pt reports tingling in bilateral hands Light Touch Impaired Details: Impaired LLE;Impaired RLE Hot/Cold: Appears Intact Proprioception: Appears Intact Stereognosis: Not tested Coordination Gross Motor Movements are Fluid and Coordinated: No Fine Motor Movements are Fluid and  Coordinated: Yes Coordination and Movement Description: decreased speed of movements in all 4 extermities, Finger Nose Finger Test: intact BUE Motor  Motor Motor: Motor apraxia Motor - Skilled Clinical Observations: mild motor planning deficits with gait transfers and bed mobility   Trunk/Postural Assessment  Cervical Assessment Cervical Assessment: Within Functional Limits Thoracic Assessment Thoracic Assessment: Exceptions to [facility] (rounded shoulders) Lumbar Assessment Lumbar Assessment: Exceptions to [facility] (mild posterior pelvic tilt) Postural Control Postural Control: Deficits on evaluation Righting Reactions: delayed in standing; posterior weight shifting preference  Balance Balance Balance Assessed: Yes Static Sitting Balance Static Sitting - Balance Support: Feet supported Static Sitting - Level of Assistance: 5: Stand by assistance Dynamic Sitting Balance Dynamic Sitting - Balance Support: Feet supported Dynamic Sitting - Level of Assistance: 4: Min assist Static Standing Balance Static Standing - Balance Support: Bilateral upper extremity supported Static Standing - Level of Assistance: 4: Min assist Dynamic Standing Balance Dynamic Standing - Balance Support: Bilateral upper extremity supported Dynamic Standing - Level of Assistance: 3: Mod assist Extremity Assessment  RUE Assessment RUE Assessment: Exceptions to [facility] Passive Range of Motion (PROM) Comments: WNL Active Range of Motion (AROM) Comments: WFL General Strength Comments: RUE 4-/5 LUE Assessment LUE Assessment: Within Functional Limits RLE Assessment RLE Assessment: Exceptions to [facility] General Strength Comments: grossly 4+/5 proximal to distal with delayed activation LLE Assessment LLE Assessment: Exceptions to [facility] General Strength Comments: grossly 4+/5 with below knee amputation from [place] Bed Mobility Roll left and right activity   Roll left and right assist level: Moderate  Assistance - Patient 50 - 74%    Sit to lying activity   Sit to lying assist level: Moderate Assistance - Patient 50 - 74%    Lying to sitting edge of bed activity   Lying to sitting edge of bed assist level: Moderate Assistance - Patient 50 - 74%     Care Tool Transfers Sit to stand transfer   Sit to stand assist level: Moderate Assistance - Patient 50 - 74%    Chair/bed transfer   Chair/bed transfer assist level: Moderate Assistance - Patient 50 - 74%     Toilet transfer   Assist Level: Minimal Assistance - Patient > 75%    Car transfer Car transfer activity did not occur: Safety/medical concerns        Care Tool Locomotion Ambulation   Assist level: Moderate Assistance - Patient 50 - 74% Assistive device: Walker-rolling Max distance: 20  Walk 10 feet activity   Assist level: Moderate Assistance - Patient - 50 - 74% Assistive device: Walker-rolling  Walk 50 feet with 2 turns activity Walk 50 feet with 2 turns activity did not occur: Safety/medical concerns      Walk 150 feet activity Walk 150 feet activity did not occur: Safety/medical concerns      Walk 10 feet on uneven surfaces activity Walk 10 feet on uneven surfaces activity did not occur: Safety/medical concerns      Stairs Stair activity did not occur: Safety/medical concerns        Walk up/down 1 step activity Walk up/down 1 step or curb (drop down) activity did not occur: Safety/medical concerns     Walk up/down 4 steps activity did not occuR: Safety/medical concerns  Walk up/down 4 steps activity      Walk up/down 12 steps activity Walk up/down 12 steps activity did not occur: Safety/medical concerns      Pick up small objects from floor Pick up small object from the floor (from standing position) activity did not occur: Safety/medical concerns      Wheelchair   Type of Wheelchair: Manual   Wheelchair assist level: Minimal Assistance - Patient > 75% Max wheelchair distance: 100  Wheel 50 feet  with 2 turns activity   Assist Level: Minimal Assistance - Patient > 75%  Wheel 150 feet activity   Assist Level: Moderate Assistance - Patient 50 - 74%    Refer to Care Plan for Long Term Goals  SHORT TERM GOAL WEEK 1 PT Short Term Goal 1 (Week 1): Pt will performed bed mobiltiy with min assist PT Short Term Goal 2 (Week 1): Pt will trasnfer to and from [facility] with min assist PT Short Term Goal 3 (Week 1): Pt will propell WC 165f with min assist PT Short Term Goal 4 (Week 1): Pt will ambulate 521fwith min assist and LRAD  Recommendations for other services: None   Skilled Therapeutic Intervention  Pt received sitting in WC and agreeable to PT. PT instructed patient in PT Evaluation and initiated treatment intervention; see above for results. PT educated patient in PODupontrehab potential, rehab goals, and discharge recommendations along with recommendation for follow-up rehabilitation services. Pt unable to performed stair management without UE support see below for stair training with BUE support. Gait training with increasing R lateral pushing with fatigue and in turns. Pt returned to room and performed stand pivot transfer to bed with RW and mod assist as listed. Sit>supine completed with mod assist for BLE management as listed. Pt  left supine in bed with call bell in reach and all needs met.     Mobility Bed Mobility Bed Mobility: Rolling Right;Right Sidelying to Sit;Supine to Sit;Rolling Left;Sit to Supine Rolling Right: Minimal Assistance - Patient > 75% Rolling Left: Minimal Assistance - Patient > 75% Right Sidelying to Sit: Moderate Assistance - Patient 50-74% Supine to Sit: Moderate Assistance - Patient 50-74% Sit to Supine: Moderate Assistance - Patient 50-74% Transfers Transfers: Sit to Stand;Stand to Sit;Stand Pivot Transfers Sit to Stand: Moderate Assistance - Patient 50-74% Stand to Sit: Moderate Assistance - Patient 50-74% Stand Pivot Transfers: Moderate Assistance -  Patient 50 - 74% Stand Pivot Transfer Details: Verbal cues for precautions/safety;Verbal cues for safe use of DME/AE Transfer (Assistive device): Rolling walker Locomotion  Gait Ambulation: Yes Gait Assistance: Moderate Assistance - Patient 50-74% Gait Distance (Feet): 20 Feet Assistive device: Rolling walker Gait Assistance Details: Visual cues for safe use of DME/AE;Verbal cues for technique;Verbal cues for precautions/safety;Verbal cues for gait pattern;Verbal cues for safe use of DME/AE;Manual facilitation for weight  shifting Gait Assistance Details: noted lateral lean/push to the R, increased in turnsm requiring constant assist to shift weight L and advance the RLE Gait Gait: Yes Gait Pattern: Impaired Gait Pattern: Step-to pattern;Decreased stance time - left;Lateral trunk lean to right;Shuffle;Poor foot clearance - left;Poor foot clearance - right;Abducted - left Stairs / Additional Locomotion Stairs: Yes Stairs Assistance: Moderate Assistance - Patient 50 - 74% Stair Management Technique: Two rails Number of Stairs: 1 Height of Stairs: 3 Wheelchair Mobility Wheelchair Mobility: Yes Wheelchair Assistance: Minimal assistance - Patient >75% Wheelchair Propulsion: Both upper extremities Wheelchair Parts Management: Needs assistance Distance: 100   Discharge Criteria: Patient will be discharged from PT if patient refuses treatment 3 consecutive times without medical reason, if treatment goals not met, if there is a change in medical status, if patient makes no progress towards goals or if patient is discharged from hospital.  The above assessment, treatment plan, treatment alternatives and goals were discussed and mutually agreed upon: by patient  Lorie Phenix 04/08/2020, 1:00 PM

## 2020-04-08 NOTE — Progress Notes (Signed)
RT placed patient on CPAP. Patient tolerating settings well at this time. RT will monitor as needed. °

## 2020-04-08 NOTE — Plan of Care (Signed)
  Problem: RH Balance Goal: LTG Patient will maintain dynamic sitting balance (PT) Description: LTG:  Patient will maintain dynamic sitting balance with assistance during mobility activities (PT) Flowsheets (Taken 04/08/2020 1604) LTG: Pt will maintain dynamic sitting balance during mobility activities with:: Supervision/Verbal cueing Goal: LTG Patient will maintain dynamic standing balance (PT) Description: LTG:  Patient will maintain dynamic standing balance with assistance during mobility activities (PT) Flowsheets (Taken 04/08/2020 1604) LTG: Pt will maintain dynamic standing balance during mobility activities with:: Supervision/Verbal cueing   Problem: RH Bed Mobility Goal: LTG Patient will perform bed mobility with assist (PT) Description: LTG: Patient will perform bed mobility with assistance, with/without cues (PT). Flowsheets (Taken 04/08/2020 1604) LTG: Pt will perform bed mobility with assistance level of: Supervision/Verbal cueing   Problem: RH Bed to Chair Transfers Goal: LTG Patient will perform bed/chair transfers w/assist (PT) Description: LTG: Patient will perform bed to chair transfers with assistance (PT). Flowsheets (Taken 04/08/2020 1604) LTG: Pt will perform Bed to Chair Transfers with assistance level: Supervision/Verbal cueing   Problem: RH Car Transfers Goal: LTG Patient will perform car transfers with assist (PT) Description: LTG: Patient will perform car transfers with assistance (PT). Flowsheets (Taken 04/08/2020 1604) LTG: Pt will perform car transfers with assist:: Minimal Assistance - Patient > 75%   Problem: RH Ambulation Goal: LTG Patient will ambulate in controlled environment (PT) Description: LTG: Patient will ambulate in a controlled environment, # of feet with assistance (PT). Flowsheets (Taken 04/08/2020 1604) LTG: Pt will ambulate in controlled environ  assist needed:: Supervision/Verbal cueing LTG: Ambulation distance in controlled environment: 128ft with  LRAD Goal: LTG Patient will ambulate in home environment (PT) Description: LTG: Patient will ambulate in home environment, # of feet with assistance (PT). Flowsheets (Taken 04/08/2020 1604) LTG: Pt will ambulate in home environ  assist needed:: Supervision/Verbal cueing LTG: Ambulation distance in home environment: 25ft with LRAD   Problem: RH Wheelchair Mobility Goal: LTG Patient will propel w/c in controlled environment (PT) Description: LTG: Patient will propel wheelchair in controlled environment, # of feet with assist (PT) Flowsheets (Taken 04/08/2020 1604) LTG: Pt will propel w/c in controlled environ  assist needed:: Supervision/Verbal cueing LTG: Propel w/c distance in controlled environment: 161ft

## 2020-04-09 LAB — GLUCOSE, CAPILLARY
Glucose-Capillary: 219 mg/dL — ABNORMAL HIGH (ref 70–99)
Glucose-Capillary: 271 mg/dL — ABNORMAL HIGH (ref 70–99)
Glucose-Capillary: 297 mg/dL — ABNORMAL HIGH (ref 70–99)
Glucose-Capillary: 260 mg/dL — ABNORMAL HIGH (ref 70–99)

## 2020-04-09 MED ORDER — INSULIN GLARGINE 100 UNIT/ML ~~LOC~~ SOLN
10.0000 [IU] | Freq: Two times a day (BID) | SUBCUTANEOUS | Status: DC
  Administered 2020-04-09 – 2020-04-10 (×3): 10 [IU] via SUBCUTANEOUS
  Filled 2020-04-09 (×4): qty 0.1

## 2020-04-09 NOTE — Plan of Care (Signed)
  Problem: RH BLADDER ELIMINATION Goal: RH STG MANAGE BLADDER WITH ASSISTANCE Description: STG Manage Bladder With mod I Assistance Outcome: Not Progressing; incontinence; condom cath

## 2020-04-09 NOTE — Progress Notes (Addendum)
[place] PHYSICAL MEDICINE & REHABILITATION PROGRESS NOTE   Subjective/Complaints: Slept well, used CPAP. Pleased with therapies yesterday but happy to have break today  ROS: Patient denies fever, rash, sore throat, blurred vision, nausea, vomiting, diarrhea, cough, shortness of breath or chest pain, joint or back pain, headache, or mood change. .   Objective:   No results found. No results for input(s): WBC, HGB, HCT, PLT in the last 72 hours. Recent Labs    04/07/20 1518  CREATININE 1.28*    Intake/Output Summary (Last 24 hours) at 04/09/2020 0936 Last data filed at 04/09/2020 0539 Gross per 24 hour  Intake 440 ml  Output 1050 ml  Net -610 ml        Physical Exam: Vital Signs Blood pressure (!) 155/78, pulse 79, temperature 98.4 F (36.9 C), temperature source Oral, resp. rate 20, height 5\' 9"  (1.753 m), weight 98.3 kg, SpO2 95 %.  Constitutional: No distress . Vital signs reviewed. obese HEENT: EOMI, oral membranes moist Neck: supple Cardiovascular: RRR without murmur. No JVD    Respiratory/Chest: CTA Bilaterally without wheezes or rales. Normal effort    GI/Abdomen: BS +, non-tender, non-distended Ext: no clubbing, cyanosis, or edema Psych: pleasant and cooperative Skin: Clean and intact without signs of breakdown Neuro: Pt is cognitively appropriate with reasonable insight, memory, and awareness. Right central 7 with dysarthria ongoing.  Sensory exam is normal. Reflexes are 2+ in all 4's. Fine motor coordination is intact. No tremors. RUE 4-/5 prox to distal with PD. RLE 4/5--motor exam unchanged today. 5/5 LUE and LLE.   Musculoskeletal: Full ROM, No pain with AROM or PROM in the neck, trunk, or extremities. Posture appropriate       Assessment/Plan: 1. Functional deficits which require 3+ hours per day of interdisciplinary therapy in a comprehensive inpatient rehab setting.  Physiatrist is providing close team supervision and 24 hour management of active  medical problems listed below.  Physiatrist and rehab team continue to assess barriers to discharge/monitor patient progress toward functional and medical goals  Care Tool:  Bathing              Bathing assist Assist Level: Moderate Assistance - Patient 50 - 74%     Upper Body Dressing/Undressing Upper body dressing   What is the patient wearing?: Pull over shirt    Upper body assist Assist Level: Contact Guard/Touching assist    Lower Body Dressing/Undressing Lower body dressing      What is the patient wearing?: Incontinence brief     Lower body assist Assist for lower body dressing: Moderate Assistance - Patient 50 - 74%     Toileting Toileting    Toileting assist Assist for toileting: Moderate Assistance - Patient 50 - 74%     Transfers Chair/bed transfer  Transfers assist     Chair/bed transfer assist level: Moderate Assistance - Patient 50 - 74%     Locomotion Ambulation   Ambulation assist      Assist level: Moderate Assistance - Patient 50 - 74% Assistive device: Walker-rolling Max distance: 20   Walk 10 feet activity   Assist     Assist level: Moderate Assistance - Patient - 50 - 74% Assistive device: Walker-rolling   Walk 50 feet activity   Assist Walk 50 feet with 2 turns activity did not occur: Safety/medical concerns         Walk 150 feet activity   Assist Walk 150 feet activity did not occur: Safety/medical concerns  Walk 10 feet on uneven surface  activity   Assist Walk 10 feet on uneven surfaces activity did not occur: Safety/medical concerns         Wheelchair     Assist   Type of Wheelchair: Manual    Wheelchair assist level: Minimal Assistance - Patient > 75% Max wheelchair distance: 100    Wheelchair 50 feet with 2 turns activity    Assist        Assist Level: Minimal Assistance - Patient > 75%   Wheelchair 150 feet activity     Assist      Assist Level: Moderate  Assistance - Patient 50 - 74%   Blood pressure (!) 155/78, pulse 79, temperature 98.4 F (36.9 C), temperature source Oral, resp. rate 20, height 5\' 9"  (1.753 m), weight 98.3 kg, SpO2 95 %.  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 Antithrombotics: -DVT/anticoagulation:Lovenox -antiplatelet therapy: Aspirin 81 mg daily and Plavix 75 mg daily x3 weeks then Plavix alone 3. Pain Management:Tylenol as needed 4. Mood:Resume Lexapro 10 mg daily -antipsychotic agents: N/A 5. Neuropsych: This patientiscapable of making decisions on hisown behalf. 6. Skin/Wound Care:Routine skin checks 7. Fluids/Electrolytes/Nutrition:Routine in and outs with follow-up chemistries 8. Dysphagia. Carb modified nectar thick liquid.   -advance per speech therapy 9. Diabetes mellitus with obesity,peripheral neuropathy. Hemoglobin A1c 8.5.   -Home regimen:NovoLog 12units 3 times daily, Lantus insulin 40units twice daily.  -1/9 sugars remain elevated    -begin lantus 10u bid 10. AKI on CKD stage III. Baseline creatinine 1.1-1.4. Follow-up chemistries 11. Permissive hypertension. Presently on Toprol-XL 25 mg daily. Patient on Norvasc 10 mg nightly, Cozaar 100 mg daily, Imdur 30 mg nightly prior to admission.  -1/8-9: bp in range. No changes today 12. Hyperlipidemia. Zetia/Crestor 13. CAD with stenting. Continue aspirin and Plavix 14. Left BKA 2015. Patient does have a prosthesis which appears to be in working order but is due for replacement soon per pt([employer])    LOS: 2 days A FACE TO FACE EVALUATION WAS PERFORMED  08-02-1985 04/09/2020, 9:36 AM

## 2020-04-09 NOTE — Progress Notes (Signed)
Wife concern re: left eye discharges. RN placed warm compress; continued to monitor.

## 2020-04-10 ENCOUNTER — Inpatient Hospital Stay (HOSPITAL_COMMUNITY): Payer: No Typology Code available for payment source | Admitting: Speech Pathology

## 2020-04-10 ENCOUNTER — Encounter (HOSPITAL_COMMUNITY): Payer: Self-pay | Admitting: Physical Medicine & Rehabilitation

## 2020-04-10 ENCOUNTER — Inpatient Hospital Stay (HOSPITAL_COMMUNITY): Payer: No Typology Code available for payment source

## 2020-04-10 ENCOUNTER — Inpatient Hospital Stay (HOSPITAL_COMMUNITY): Payer: No Typology Code available for payment source | Admitting: Occupational Therapy

## 2020-04-10 LAB — CBC WITH DIFFERENTIAL/PLATELET
Abs Immature Granulocytes: 0.02 10*3/uL (ref 0.00–0.07)
Basophils Absolute: 0 10*3/uL (ref 0.0–0.1)
Basophils Relative: 0 %
Eosinophils Absolute: 0.2 10*3/uL (ref 0.0–0.5)
Eosinophils Relative: 2 %
HCT: 38.2 % — ABNORMAL LOW (ref 39.0–52.0)
Hemoglobin: 13 g/dL (ref 13.0–17.0)
Immature Granulocytes: 0 %
Lymphocytes Relative: 24 %
Lymphs Abs: 2.4 10*3/uL (ref 0.7–4.0)
MCH: 29.2 pg (ref 26.0–34.0)
MCHC: 34 g/dL (ref 30.0–36.0)
MCV: 85.8 fL (ref 80.0–100.0)
Monocytes Absolute: 0.8 10*3/uL (ref 0.1–1.0)
Monocytes Relative: 8 %
Neutro Abs: 6.7 10*3/uL (ref 1.7–7.7)
Neutrophils Relative %: 66 %
Platelets: 240 10*3/uL (ref 150–400)
RBC: 4.45 MIL/uL (ref 4.22–5.81)
RDW: 14 % (ref 11.5–15.5)
WBC: 10.2 10*3/uL (ref 4.0–10.5)
nRBC: 0 % (ref 0.0–0.2)

## 2020-04-10 LAB — COMPREHENSIVE METABOLIC PANEL
ALT: 39 U/L (ref 0–44)
AST: 30 U/L (ref 15–41)
Albumin: 2.8 g/dL — ABNORMAL LOW (ref 3.5–5.0)
Alkaline Phosphatase: 85 U/L (ref 38–126)
Anion gap: 11 (ref 5–15)
BUN: 13 mg/dL (ref 8–23)
CO2: 25 mmol/L (ref 22–32)
Calcium: 8.9 mg/dL (ref 8.9–10.3)
Chloride: 101 mmol/L (ref 98–111)
Creatinine, Ser: 1.14 mg/dL (ref 0.61–1.24)
GFR, Estimated: >60 mL/min (ref >60)
Glucose, Bld: 243 mg/dL — ABNORMAL HIGH (ref 70–99)
Potassium: 3.9 mmol/L (ref 3.5–5.1)
Sodium: 137 mmol/L (ref 135–145)
Total Bilirubin: 0.4 mg/dL (ref 0.3–1.2)
Total Protein: 7.2 g/dL (ref 6.5–8.1)

## 2020-04-10 LAB — GLUCOSE, CAPILLARY
Glucose-Capillary: 220 mg/dL — ABNORMAL HIGH (ref 70–99)
Glucose-Capillary: 273 mg/dL — ABNORMAL HIGH (ref 70–99)
Glucose-Capillary: 271 mg/dL — ABNORMAL HIGH (ref 70–99)
Glucose-Capillary: 284 mg/dL — ABNORMAL HIGH (ref 70–99)

## 2020-04-10 MED ORDER — INSULIN GLARGINE 100 UNIT/ML ~~LOC~~ SOLN
12.0000 [IU] | Freq: Two times a day (BID) | SUBCUTANEOUS | Status: DC
  Administered 2020-04-10 – 2020-04-11 (×2): 12 [IU] via SUBCUTANEOUS
  Filled 2020-04-10 (×3): qty 0.12

## 2020-04-10 NOTE — Progress Notes (Signed)
[facility] PHYSICAL MEDICINE & REHABILITATION PROGRESS NOTE   Subjective/Complaints:  Seen in PT, co Right hip pain, cannot state if groin,side  or buttocks pain No prior hx of hip pain, no hx of hip surgery   ROS: Patient deniesCP, SOB, N/V/DObjective:   No results found. Recent Labs    04/10/20 0621  WBC 10.2  HGB 13.0  HCT 38.2*  PLT 240   Recent Labs    04/07/20 1518 04/10/20 0621  NA  --  137  K  --  3.9  CL  --  101  CO2  --  25  GLUCOSE  --  243*  BUN  --  13  CREATININE 1.28* 1.14  CALCIUM  --  8.9    Intake/Output Summary (Last 24 hours) at 04/10/2020 0858 Last data filed at 04/10/2020 0851 Gross per 24 hour  Intake 600 ml  Output 950 ml  Net -350 ml        Physical Exam: Vital Signs Blood pressure (!) 144/83, pulse 80, temperature 98 F (36.7 C), temperature source Oral, resp. rate 18, height 5\' 9"  (1.753 m), weight 98.3 kg, SpO2 98 %.   General: No acute distress Mood and affect are appropriate Heart: Regular rate and rhythm no rubs murmurs or extra sounds Lungs: Clear to auscultation, breathing unlabored, no rales or wheezes Abdomen: Positive bowel sounds, soft nontender to palpation, nondistended Extremities: No clubbing, cyanosis, or edema Skin: No evidence of breakdown, no evidence of rash   Neuro: Pt is cognitively appropriate with reasonable insight, memory, and awareness. Right central 7 with dysarthria ongoing.  Sensory exam is normal. Reflexes are 2+ in all 4's. Fine motor coordination is intact. No tremors. RUE 4-/5 prox to distal with PD. RLE 4/5--motor exam unchanged today. 5/5 LUE and LLE.   Musculoskeletal: no pain with Right hip ROM or with AROM, no pain to palp over greater troch       Assessment/Plan: 1. Functional deficits which require 3+ hours per day of interdisciplinary therapy in a comprehensive inpatient rehab setting.  Physiatrist is providing close team supervision and 24 hour management of active medical problems  listed below.  Physiatrist and rehab team continue to assess barriers to discharge/monitor patient progress toward functional and medical goals  Care Tool:  Bathing              Bathing assist Assist Level: Moderate Assistance - Patient 50 - 74%     Upper Body Dressing/Undressing Upper body dressing   What is the patient wearing?: Pull over shirt    Upper body assist Assist Level: Contact Guard/Touching assist    Lower Body Dressing/Undressing Lower body dressing      What is the patient wearing?: Incontinence brief     Lower body assist Assist for lower body dressing: Moderate Assistance - Patient 50 - 74%     Toileting Toileting    Toileting assist Assist for toileting: Moderate Assistance - Patient 50 - 74%     Transfers Chair/bed transfer  Transfers assist     Chair/bed transfer assist level: Moderate Assistance - Patient 50 - 74%     Locomotion Ambulation   Ambulation assist      Assist level: Moderate Assistance - Patient 50 - 74% Assistive device: Walker-rolling Max distance: 20   Walk 10 feet activity   Assist     Assist level: Moderate Assistance - Patient - 50 - 74% Assistive device: Walker-rolling   Walk 50 feet activity   Assist Walk 50  feet with 2 turns activity did not occur: Safety/medical concerns         Walk 150 feet activity   Assist Walk 150 feet activity did not occur: Safety/medical concerns         Walk 10 feet on uneven surface  activity   Assist Walk 10 feet on uneven surfaces activity did not occur: Safety/medical concerns         Wheelchair     Assist   Type of Wheelchair: Manual    Wheelchair assist level: Minimal Assistance - Patient > 75% Max wheelchair distance: 100    Wheelchair 50 feet with 2 turns activity    Assist        Assist Level: Minimal Assistance - Patient > 75%   Wheelchair 150 feet activity     Assist      Assist Level: Moderate Assistance -  Patient 50 - 74%   Blood pressure (!) 144/83, pulse 80, temperature 98 F (36.7 C), temperature source Oral, resp. rate 18, height 5\' 9"  (1.753 m), weight 98.3 kg, SpO2 98 %.  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 Antithrombotics: -DVT/anticoagulation:Lovenox -antiplatelet therapy: Aspirin 81 mg daily and Plavix 75 mg daily x3 weeks then Plavix alone 3. Pain Management:Tylenol as needed- ? R hip pain , cannot reproduce PT will monitor while pt in therapy  4. Mood:Resume Lexapro 10 mg daily -antipsychotic agents: N/A 5. Neuropsych: This patientiscapable of making decisions on hisown behalf. 6. Skin/Wound Care:Routine skin checks 7. Fluids/Electrolytes/Nutrition:Routine in and outs with follow-up chemistries 8. Dysphagia. Carb modified nectar thick liquid.   -advance per speech therapy 9. Diabetes mellitus with obesity,peripheral neuropathy. Hemoglobin A1c 8.5.   -Home regimen:NovoLog 12units 3 times daily, Lantus insulin 40units twice daily.  -1/9 sugars remain elevated    -begin lantus 10u bid CBG (last 3)  Recent Labs    04/09/20 1633 04/09/20 2056 04/10/20 0600  GLUCAP 297* 260* 220*  increase lantus to 12U BID  10. AKI on CKD stage III. Baseline creatinine 1.1-1.4. Follow-up chemistries 11. Permissive hypertension. Presently on Toprol-XL 25 mg daily. Patient on Norvasc 10 mg nightly, Cozaar 100 mg daily, Imdur 30 mg nightly prior to admission.   Vitals:   04/09/20 2303 04/10/20 0419  BP:  (!) 144/83  Pulse: 75 80  Resp: 18 18  Temp:  98 F (36.7 C)  SpO2: 96% 98%  fair control cont current meds    12. Hyperlipidemia. Zetia/Crestor 13. CAD with stenting. Continue aspirin and Plavix 14. Left BKA 2015. Patient does have a  prosthesis which appears to be in working order but is due for replacement soon per pt([employer])    LOS: 3 days A FACE TO FACE EVALUATION WAS PERFORMED  06/08/20 04/10/2020, 8:58 AM

## 2020-04-10 NOTE — Progress Notes (Signed)
Inpatient Rehabilitation  Patient information reviewed and entered into eRehab system by Shiquan Mathieu M. Karysa Heft, M.A., CCC/SLP, PPS Coordinator.  Information including medical coding, functional ability and quality indicators will be reviewed and updated through discharge.    

## 2020-04-10 NOTE — Progress Notes (Signed)
Speech Language Pathology Daily Session Note  Patient Details  Name: Leland Raver MRN: [id number] Date of Birth: 05-13-1940  Today's Date: 04/11/2020 SLP Individual Time: 8469-6295 SLP Individual Time Calculation (min): 55 min  Short Term Goals: Week 1: SLP Short Term Goal 1 (Week 1): Pt will participate in pharyngeal exercises (Mendelsohn and Supraglottic swallow) to increse timing of swallow initation and consume trials of thin liquids with vital s/s remaining WFL and minimal overt s/s aspiration prior to repeat instrumental swallow assessment. SLP Short Term Goal 2 (Week 1): Pt will demonstrate sustained attention in 30 minute intervals with supervision A verbal cues for redrirection to task. SLP Short Term Goal 3 (Week 1): Pt will coordinate phonation and respiration at the phrase level with supervision A verbal cues. SLP Short Term Goal 4 (Week 1): Pt will complete functional mildly complex problem solving tasks with min A verbal cues. SLP Short Term Goal 5 (Week 1): Pt will self-monitor and self-correct functional errors with supervision A verbal cues. SLP Short Term Goal 6 (Week 1): Pt will demonstrate recall of novel information with supervision A verbal cues.  Skilled Therapeutic Interventions: Pt was seen for skilled ST targeting dysphagia and cognitive goals. Pt's wife present at bedside for session. After oral care, pt accepted 10 ice chips without overt s/sx aspiration. Pt also consumed self-fed cup sips of thin H2O totaling ~6oz, with 2 cough responses after consecutive sips. Single sips eliminated overt s/sx aspiration, but multiple swallows still observed occasionally, and pt required Min-Mod verbal cues to utilize a strategy to remember to take effective single sips (physically taking cup away from lips after each sip). Recommend continue current diet, however pt is progressing well toward readiness for repeat MBSS. SLP further facilitated session with basic medication management  tasks. He required overall Min A verbal cues for redirection in a quiet environment, and Mod A verbal and visual cues for error awareness and functional problem solving when interpreting basic medication labels and organizing a QID pill chart. Pt with better awareness of level of difficulty this task presented him today, in comparison to money management task completed yesterday. Pt left sitting upright in bed with alarm set and needs within reach, wife still present. Continue per current plan of care.        Pain Pain Assessment Pain Scale: 0-10 Pain Score: 0-No pain  Therapy/Group: Individual Therapy  [name] 04/11/2020, 12:47 PM

## 2020-04-10 NOTE — IPOC Note (Signed)
Overall Plan of Care [facility]) Patient Details Name: Jordan [name] MRN: [id number] DOB: [birth year]  Admitting Diagnosis: Infarction of left basal ganglia [facility])  Hospital Problems: Principal Problem:   Infarction of left basal ganglia (HCC) Active Problems:   Diabetes mellitus type 2 in obese [facility])     Functional Problem List: Nursing Bladder,Bowel,Edema,Endurance,Medication Management,Motor  PT Balance,Behavior,Edema,Motor,Endurance,Nutrition,Perception,Safety,Sensory,Skin Integrity  OT Balance,Safety,Cognition,Sensory,Skin Integrity,Endurance,Motor  SLP Cognition  TR         Basic ADL's: OT Grooming,Bathing,Dressing,Toileting     Advanced  ADL's: OT       Transfers: PT Bed Mobility,Bed to Chair,Car,Furniture  OT Toilet,Tub/Shower     Locomotion: PT Ambulation,Wheelchair Mobility,Stairs     Additional Impairments: OT Fuctional Use of Upper Extremity  SLP Swallowing,Communication,Social Cognition expression Attention,Memory,Problem Solving,Awareness  TR      Anticipated Outcomes Item Anticipated Outcome  Self Feeding    Swallowing  Supervision A   Basic self-care  supervision  Toileting  supervision   Bathroom Transfers supervision  Bowel/Bladder  manage bowel and bladder with mod I assist  Transfers  Supervision assist with LRAD  Locomotion  supevision assist ambulatory with LRAD  Communication     Cognition  Supervision-Min A  Pain  remain free of pain  Safety/Judgment  remain free of injury, prevent falls with mod I assist   Therapy Plan: PT Intensity: Minimum of 1-2 x/day ,45 to 90 minutes PT Frequency: 5 out of 7 days PT Duration Estimated Length of Stay: 16-20 days OT Intensity: Minimum of 1-2 x/day, 45 to 90 minutes OT Frequency: 5 out of 7 days OT Duration/Estimated Length of Stay: 16-20 days SLP Intensity: Minumum of 1-2 x/day, 30 to 90 minutes SLP Frequency: 3 to 5 out of 7 days SLP Duration/Estimated Length of Stay: 17-20 days    Due to the current state of emergency, patients may not be receiving their 3-hours of Medicare-mandated therapy.   Team Interventions: Nursing Interventions Patient/Family Education,Medication Management,Discharge Planning,Bladder Management,Psychosocial Support,Bowel Management  PT interventions Ambulation/gait training,Balance/vestibular training,Cognitive remediation/compensation,Community reintegration,Discharge planning,DME/adaptive equipment instruction,Disease management/prevention,Neuromuscular re-education,Functional mobility training,Patient/family education,Pain management,Skin care/wound management,Psychosocial support,Splinting/orthotics,Stair training,Therapeutic Activities,Therapeutic Exercise,UE/LE Strength taining/ROM,Wheelchair propulsion/positioning,Visual/perceptual remediation/compensation,UE/LE Coordination activities  OT Interventions Balance/vestibular training,Discharge planning,Self Care/advanced ADL retraining,Therapeutic Activities,UE/LE Coordination activities,Cognitive remediation/compensation,Disease mangement/prevention,Functional mobility training,Patient/family education,Skin care/wound Market researcher re-education,Psychosocial support,UE/LE Strength taining/ROM,Wheelchair propulsion/positioning  SLP Interventions Cueing hierarchy,Functional tasks,Medication managment,Cognitive remediation/compensation,Environmental controls,Internal/external aids,Patient/family education  TR Interventions    SW/CM Interventions     Barriers to Discharge MD  Medical stability  Nursing      PT Decreased caregiver support,Home environment Soil scientist for SNF coverage,Behavior    OT      SLP      SW       Team Discharge Planning: Destination: PT-Home ,OT- Home , SLP-Home Projected Follow-up: PT-Home health PT, OT-  Home health OT, SLP-Home Health SLP,Outpatient SLP,24 hour  supervision/assistance Projected Equipment Needs: PT-Wheelchair cushion (measurements),Wheelchair (measurements),To be determined, OT- To be determined, SLP-None recommended by SLP Equipment Details: PT- , OT-Pt reports he owns a shower chair and a RW. Patient/family involved in discharge planning: PT- Patient,  OT-Patient, SLP-Patient  MD ELOS: 10-14d Medical Rehab Prognosis:  Good Assessment:  80 year old right-handed male with history of left BKA 2015 and has a prosthesis, CAD/stenting maintained on aspirin, aortic stenosis chronic diastolic congestive heart failure, diabetes mellitus, hypertension hyperlipidemia, CKD stage III, carotid stenosis. Per chart review lives with spouse independent with assistive device. His wife does assist with some basic ADLs. He has not driven for a couple of years. 1  level home ramped entrance. Good family support. Presented 03/31/2020 with gait abnormality slurred speech left facial droop multiple falls. MRI showed small left gangliothalamicacute infarction no hemorrhage or mass-effect. 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 Negative for large vessel occlusion. No evidence of hemodynamically significant ICA or vertebral artery stenosis in the neck. Admission chemistries unremarkable except glucose 313 creatinine 1.43 hemoglobin 12.7 BNP 32 troponin 21-15. Echocardiogram with ejection fraction of 60 to 65% no wall motion abnormalities grade 1 diastolic dysfunction. Maintained on aspirin and Plavix x3 weeks then Plavix alone for CVA prophylaxis. Subcutaneous Lovenox for DVT prophylaxis. Carb modified diet with nectar liquids. Physical medicine rehabilitation consulted due to patient's decrease in functional ability and slurred speech and was admitted for a comprehensive rehab program   Now requiring 24/7 Rehab RN,MD, as well as CIR level PT, OT and SLP.   Treatment team will focus on ADLs and mobility with goals set at Supervision  See Team Conference Notes for weekly updates to the plan of care

## 2020-04-10 NOTE — Progress Notes (Signed)
Speech Language Pathology Daily Session Note  Patient Details  Name: Jordan [name] MRN: [id number] Date of Birth: January 17, 1941  Today's Date: 04/10/2020 SLP Individual Time: 0730-0825 SLP Individual Time Calculation (min): 55 min  Short Term Goals: Week 1: SLP Short Term Goal 1 (Week 1): Pt will participate in pharyngeal exercises (Mendelsohn and Supraglottic swallow) to increse timing of swallow initation and consume trials of thin liquids with vital s/s remaining WFL and minimal overt s/s aspiration prior to repeat instrumental swallow assessment. SLP Short Term Goal 2 (Week 1): Pt will demonstrate sustained attention in 30 minute intervals with supervision A verbal cues for redrirection to task. SLP Short Term Goal 3 (Week 1): Pt will coordinate phonation and respiration at the phrase level with supervision A verbal cues. SLP Short Term Goal 4 (Week 1): Pt will complete functional mildly complex problem solving tasks with min A verbal cues. SLP Short Term Goal 5 (Week 1): Pt will self-monitor and self-correct functional errors with supervision A verbal cues. SLP Short Term Goal 6 (Week 1): Pt will demonstrate recall of novel information with supervision A verbal cues.  Skilled Therapeutic Interventions: Pt was seen for skilled ST targeting dysphagia and cognitive goals. SLP facilitated session with skilled observation and of pt consuming current diet (regular/nectar thick liquid) breakfast tray. Supervision A verbal cues provided for oral clearance of trace residue at end of meal. One delayed cough noted at end of intake, however did not appear directly linked to PO intake. Discussed CIR dysphagia goals with pt. Recommend continue current diet with intermittent supervision. SLP further facilitated session with a basic money management task using coins and cash. He required Min A verbal and visual cueing for problem solving, use of verbal repetition for working memory, and Moderate verbal cues for  awareness of his errors. Pt also required some extra time to complete tasks, due to slower than anticipated processing. Overall Min A verbal cues provided for redirection to money management task when there was an increase in environmental distractions. Pt left laying in bed with alarm set and needs within reach. Continue per current plan of care.          Pain Pain Assessment Pain Scale: 0-10 Pain Score: 0-No pain  Therapy/Group: Individual Therapy  [name] 04/10/2020, 7:25 AM

## 2020-04-10 NOTE — Progress Notes (Signed)
Physical Therapy Session Note  Patient Details  Name: Jordan [name] MRN: [id number] Date of Birth: 05/03/1940  Today's Date: 04/10/2020 PT Individual Time: 1030-1125 PT Individual Time Calculation (min): 55 min   Short Term Goals: Week 1:  PT Short Term Goal 1 (Week 1): Pt will performed bed mobiltiy with min assist PT Short Term Goal 2 (Week 1): Pt will trasnfer to and from [facility] with min assist PT Short Term Goal 3 (Week 1): Pt will propell WC 128ft with min assist PT Short Term Goal 4 (Week 1): Pt will ambulate 41ft with min assist and LRAD  Skilled Therapeutic Interventions/Progress Updates:    Patient received sitting up in bed, asleep, initially difficult to rouse. Wet washcloth provided to assist with awakening patient. He was agreeable to PT and denied pain. TotalA to don shoes supine in bed, MinA to come sit edge of bed. PT attempting stand pivot transfer, however, patient unable to fully come to standing and soe was able to transfer with ModA to wc via squat pivot. MinA wc mobility x100 feet. PT propelling patient in wc remainder of way to therapy gym for time management. He was able to come to stand with ModA and RW, MinA to remain standing to match playing cards with emphasis on R attention and midline posture. With fatigue, patient drifting to the R requiring up to ModA to maintain balance. Forward flexed posture maintained and patient with difficulty coming to fully erect posture despite tactile cuing from PT. Possibly due to increased tone in B hip flexors. Increased cuing needed for patient to attend to playing cards on R side of board. MD present for assessment. Patient complaining of R hip pain, however, unable to reproduce this pain in sitting. Patient reports increase in R hip pain with prolonged standing, but points to region of R superior iliac crest and not greater trochanter/hip complex. He was able to remain standing for ~2 mins at a time. Extended seated rest breaks needed due  to fatigue. HR 96BPM and O2 98% upon sitting. Patient returning to room in wc, seatbelt alarm on, call light within reach.   Therapy Documentation Precautions:  Precautions Precautions: Fall Precaution Comments: L prosthetic Restrictions Weight Bearing Restrictions: No    Therapy/Group: Individual Therapy  Elizebeth Koller, PT, DPT, CBIS  04/10/2020, 7:46 AM

## 2020-04-10 NOTE — Progress Notes (Signed)
Occupational Therapy Session Note  Patient Details  Name: Jordan [name] MRN: [id number] Date of Birth: Apr 12, 1940  Today's Date: 04/10/2020 OT Individual Time: 1302-1401 OT Individual Time Calculation (min): 59 min    Short Term Goals: Week 1:  OT Short Term Goal 1 (Week 1): Pt will complete LB dressing with min assist using AE as needed. OT Short Term Goal 2 (Week 1): Pt will complete UB/LB bathing with min assist using shower bench and AE as needed. OT Short Term Goal 3 (Week 1): Pt will complete toileting with min assist. OT Short Term Goal 4 (Week 1): Pt will complete toilet transfer with CGA.  Skilled Therapeutic Interventions/Progress Updates:    Pt received in w/c, agreeable to therapy. Session focus on toileting + seated bathing/dressing. C/o of mild R hip pain at start and throughout session. Stand-pivot to toilet with RW + min A for balance. No void of bowels. Req min A for balance for LB clothing management. Seated in w/c at sink, doffed shirt with close S, doffed brief with total A, doffed pants with mod A to remove from BLE, min A for sit<>stand. Req min A to push release button to remove LLE prosthetic. Bathed full-body with overall min A to bathe R foot. Donned shirt and LLE prosthetic with close S, donned brief/pants with mod A to don over RLE + to pull over R hip in standing. Req mod A to control descent upon last stand > sit after pulling up pants.  Pt with increased knee flexion in standing as well as posterior LOB into the chair as he fatigues.  Doffed/donned R shoe with total A. Pt left in w/c with safety alarm engaged, call bell in reach, and all immediate needs met.   Therapy Documentation Precautions:  Precautions Precautions: Fall Precaution Comments: L prosthetic Restrictions Weight Bearing Restrictions: No Pain: Pain Assessment Pain Scale: Faces Pain Score: 0-No pain Faces Pain Scale: Hurts a little bit Pain Type: Acute pain Pain Location: Hip Pain  Orientation: Right ADL: See Care Tool for more details.  Therapy/Group: Individual Therapy  Volanda Napoleon, MS, OTR/L 04/10/2020, 3:42 PM   Clyda Greener OTR/L

## 2020-04-11 ENCOUNTER — Inpatient Hospital Stay (HOSPITAL_COMMUNITY): Payer: No Typology Code available for payment source | Admitting: Physical Therapy

## 2020-04-11 ENCOUNTER — Inpatient Hospital Stay (HOSPITAL_COMMUNITY): Payer: No Typology Code available for payment source | Admitting: Speech Pathology

## 2020-04-11 ENCOUNTER — Inpatient Hospital Stay (HOSPITAL_COMMUNITY): Payer: No Typology Code available for payment source | Admitting: Occupational Therapy

## 2020-04-11 LAB — GLUCOSE, CAPILLARY
Glucose-Capillary: 232 mg/dL — ABNORMAL HIGH (ref 70–99)
Glucose-Capillary: 329 mg/dL — ABNORMAL HIGH (ref 70–99)
Glucose-Capillary: 300 mg/dL — ABNORMAL HIGH (ref 70–99)
Glucose-Capillary: 236 mg/dL — ABNORMAL HIGH (ref 70–99)

## 2020-04-11 MED ORDER — ISOSORBIDE MONONITRATE ER 30 MG PO TB24
30.0000 mg | ORAL_TABLET | Freq: Every day | ORAL | Status: DC
  Administered 2020-04-11 – 2020-04-26 (×16): 30 mg via ORAL
  Filled 2020-04-11 (×17): qty 1

## 2020-04-11 MED ORDER — INSULIN GLARGINE 100 UNIT/ML ~~LOC~~ SOLN
20.0000 [IU] | Freq: Two times a day (BID) | SUBCUTANEOUS | Status: DC
  Administered 2020-04-11 – 2020-04-12 (×2): 20 [IU] via SUBCUTANEOUS
  Filled 2020-04-11 (×5): qty 0.2

## 2020-04-11 NOTE — Progress Notes (Signed)
Inpatient Rehabilitation Care Coordinator Assessment and Plan Patient Details  Name: Jordan [name] MRN: [id number] Date of Birth: 10/04/1940  Today's Date: 04/11/2020  Hospital Problems: Principal Problem:   Infarction of left basal ganglia ([place]) Active Problems:   Diabetes mellitus type 2 in obese [facility])  Past Medical History:  Past Medical History:  Diagnosis Date  . Anemia   . Aortic stenosis   . CAD (coronary artery disease)    a. s/p IMI in past tx with POBA and cath 6 mos later with occluded RCA;  b. h/o Taxus DES to LAD and CFX;  c.  cath 1/10: LM ok, mLAD 40-50%, LAD stent ok, D2 tandem 60-70%, pCFX 30%, mAVCFX stent ok, pRCA 40%, RV 90%, RV marginal 90%, dRCA filled L-R collats tx medically  . Carotid stenosis    dopplers 5/11: 40-59% bilat  . CHF (congestive heart failure) ([place])   . Diabetic ulcer of left foot ([place])   . DM2 (diabetes mellitus, type 2) ([place])   . Heart murmur   . HLD (hyperlipidemia)   . HTN (hypertension)   . Myocardial infarction ([place]) 1995 and 2005   Heart Attack  . Osteomyelitis of left foot ([place])   . Staphylococcus aureus bacteremia with sepsis [facility])    Past Surgical History:  Past Surgical History:  Procedure Laterality Date  . ABDOMINAL AORTAGRAM N/A 04/07/2013   Procedure: ABDOMINAL Maxcine Ham;  Surgeon: Wellington Hampshire, MD;  Location: [place] CATH LAB;  Service: Cardiovascular;  Laterality: N/A;  . ABDOMINAL AORTAGRAM N/A 01/26/2014   Procedure: ABDOMINAL Maxcine Ham;  Surgeon: Wellington Hampshire, MD;  Location: [place] CATH LAB;  Service: Cardiovascular;  Laterality: N/A;  . ABDOMINAL AORTAGRAM N/A 06/13/2014   Procedure: ABDOMINAL Maxcine Ham;  Surgeon: Angelia Mould, MD;  Location: [facility] CATH LAB;  Service: Cardiovascular;  Laterality: N/A;  . AMPUTATION Left 01/28/2014   Procedure: AMPUTATION DIGIT- LEFT 5TH TOE;  Surgeon: Angelia Mould, MD;  Location: [place];  Service: Vascular;  Laterality: Left;  . AMPUTATION Left 02/02/2014   Procedure:  Left 4th Ray Amputation vs Transmetatarsal Amputation;  Surgeon: Newt Minion, MD;  Location: [place];  Service: Orthopedics;  Laterality: Left;  . AMPUTATION Left 03/24/2014   Procedure: AMPUTATION BELOW KNEE;  Surgeon: Meredith Pel, MD;  Location: [place];  Service: Orthopedics;  Laterality: Left;  . APPENDECTOMY    . CARDIAC CATHETERIZATION    . CORONARY ANGIOPLASTY WITH STENT PLACEMENT    . FEMORAL-TIBIAL BYPASS GRAFT Left 01/28/2014   Procedure:  FEMORAL-ANTERIOR TIBIAL ARTERY Bypass Graft utilizing composite gortex graft and vein graft;  Surgeon: Angelia Mould, MD;  Location: [place];  Service: Vascular;  Laterality: Left;  . LOWER EXTREMITY ANGIOGRAM Left 01/26/2014   Procedure: LOWER EXTREMITY ANGIOGRAM;  Surgeon: Wellington Hampshire, MD;  Location: [place] CATH LAB;  Service: Cardiovascular;  Laterality: Left;  . RIGHT/LEFT HEART CATH AND CORONARY ANGIOGRAPHY N/A 12/02/2017   Procedure: RIGHT/LEFT HEART CATH AND CORONARY ANGIOGRAPHY;  Surgeon: Martinique, [name], MD;  Location: [place] CV LAB;  Service: Cardiovascular;  Laterality: N/A;  . TEE WITHOUT CARDIOVERSION N/A 03/28/2014   Procedure: TRANSESOPHAGEAL ECHOCARDIOGRAM (TEE);  Surgeon: Sueanne Margarita, MD;  Location: [facility] ENDOSCOPY;  Service: Cardiovascular;  Laterality: N/A;   Social History:  reports that he quit smoking about 41 years ago. His smoking use included pipe. He has never used smokeless tobacco. He reports that he does not drink alcohol and does not use drugs.  Family / Support Systems Marital Status:  Married Patient Roles: Spouse Spouse/Significant Other: Art gallery manager Children: 1 son Anticipated Caregiver: [name] (Spouse) Ability/Limitations of Caregiver: None Caregiver Availability: 24/7  Social History Preferred language: English Religion: Non-Denominational Read: Yes Write: Yes Employment Status: Retired   Abuse/Neglect Abuse/Neglect Assessment Can Be Completed: Yes Physical Abuse: Denies Verbal Abuse:  Denies Sexual Abuse: Denies Exploitation of patient/patient's resources: Denies Self-Neglect: Denies  Emotional Status Pt's affect, behavior and adjustment status: no Recent Psychosocial Issues: no Psychiatric History: no Substance Abuse History: no  Patient / Family Perceptions, Expectations & Goals Pt/Family understanding of illness & functional limitations: none Premorbid pt/family roles/activities: Previosuly independent with assitive device Anticipated changes in roles/activities/participation: Requires some assistance Pt/family expectations/goals: Supervision PT/OT/SPH  [employer]: None Premorbid Home Care/DME Agencies: Other (Comment) ([employer] [place], Conservation officer, nature, Marketing executive) Transportation available at discharge: Family able to transport  Discharge Planning Living Arrangements: Spouse/significant other Support Systems: Spouse/significant other Type of Residence: Private residence (1 level home, ramp entrance) Insurance Resources: Multimedia programmer (specify) Personnel officer Medicare) Financial Screen Referred: No Money Management: Patient Does the patient have any problems obtaining your medications?: No Home Management: Independent Patient/Family Preliminary Plans: Some assistance Care Coordinator Anticipated Follow Up Needs: HH/OP Expected length of stay: 10-14 days  Clinical Impression SW met patient and spouse at bedside. Introduced self, explained role. Patient is a English as a second language teacher. Goal is for patient to discharge home, 1 level home ramp entrance. No additional questions or concerns, SW will continue to follow up.   [name] 04/11/2020, 12:13 PM

## 2020-04-11 NOTE — Progress Notes (Signed)
[place] PHYSICAL MEDICINE & REHABILITATION PROGRESS NOTE   Subjective/Complaints: Patient sleeping but awakens easily.  According to pharmacy took Imdur at home.  Patient has tolerated therapy.  ROS: Patient deniesCP, SOB, N/V/DObjective:   No results found. Recent Labs    04/10/20 0621  WBC 10.2  HGB 13.0  HCT 38.2*  PLT 240   Recent Labs    04/10/20 0621  NA 137  K 3.9  CL 101  CO2 25  GLUCOSE 243*  BUN 13  CREATININE 1.14  CALCIUM 8.9    Intake/Output Summary (Last 24 hours) at 04/11/2020 0907 Last data filed at 04/11/2020 0733 Gross per 24 hour  Intake 440 ml  Output -  Net 440 ml        Physical Exam: Vital Signs Blood pressure (!) 163/70, pulse 74, temperature (!) 97.5 F (36.4 C), resp. rate 18, height 5\' 9"  (1.753 m), weight 98.3 kg, SpO2 94 %.   General: No acute distress Mood and affect are appropriate Heart: Regular rate and rhythm no rubs murmurs or extra sounds Lungs: Clear to auscultation, breathing unlabored, no rales or wheezes Abdomen: Positive bowel sounds, soft nontender to palpation, nondistended Extremities: No clubbing, cyanosis, or edema Skin: No evidence of breakdown, no evidence of rash   Neuro: Pt is groggy from sleep but answers appropriatelyRight central 7 with dysarthria ongoing.  Sensory exam is normal. Reflexes are 2+ in all 4's. Fine motor coordination is intact. No tremors. RUE 4-/5 prox to distal with PD. RLE 4/5--motor exam unchanged today. 5/5 LUE and LLE.   Musculoskeletal: no pain with Right hip ROM or with AROM, no pain to palp over greater troch       Assessment/Plan: 1. Functional deficits which require 3+ hours per day of interdisciplinary therapy in a comprehensive inpatient rehab setting.  Physiatrist is providing close team supervision and 24 hour management of active medical problems listed below.  Physiatrist and rehab team continue to assess barriers to discharge/monitor patient progress toward  functional and medical goals  Care Tool:  Bathing    Body parts bathed by patient: Right arm,Right upper leg,Left arm,Left upper leg,Chest,Right lower leg,Abdomen,Front perineal area,Face,Buttocks     Body parts n/a: Left lower leg (LLE BKA)   Bathing assist Assist Level: Minimal Assistance - Patient > 75%     Upper Body Dressing/Undressing Upper body dressing   What is the patient wearing?: Pull over shirt    Upper body assist Assist Level: Supervision/Verbal cueing    Lower Body Dressing/Undressing Lower body dressing      What is the patient wearing?: Pants,Incontinence brief     Lower body assist Assist for lower body dressing: Moderate Assistance - Patient 50 - 74%     Toileting Toileting    Toileting assist Assist for toileting: Minimal Assistance - Patient > 75%     Transfers Chair/bed transfer  Transfers assist     Chair/bed transfer assist level: Moderate Assistance - Patient 50 - 74%     Locomotion Ambulation   Ambulation assist      Assist level: Moderate Assistance - Patient 50 - 74% Assistive device: Walker-rolling Max distance: 20   Walk 10 feet activity   Assist     Assist level: Moderate Assistance - Patient - 50 - 74% Assistive device: Walker-rolling   Walk 50 feet activity   Assist Walk 50 feet with 2 turns activity did not occur: Safety/medical concerns         Walk 150 feet activity  Assist Walk 150 feet activity did not occur: Safety/medical concerns         Walk 10 feet on uneven surface  activity   Assist Walk 10 feet on uneven surfaces activity did not occur: Safety/medical concerns         Wheelchair     Assist Will patient use wheelchair at discharge?: Yes Type of Wheelchair: Manual    Wheelchair assist level: Contact Guard/Touching assist Max wheelchair distance: 100    Wheelchair 50 feet with 2 turns activity    Assist        Assist Level: Contact Guard/Touching assist    Wheelchair 150 feet activity     Assist      Assist Level: Moderate Assistance - Patient 50 - 74%   Blood pressure (!) 163/70, pulse 74, temperature (!) 97.5 F (36.4 C), resp. rate 18, height 5\' 9"  (1.753 m), weight 98.3 kg, SpO2 94 %.  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 Antithrombotics: -DVT/anticoagulation:Lovenox -antiplatelet therapy: Aspirin 81 mg daily and Plavix 75 mg daily x3 weeks then Plavix alone 3. Pain Management:Tylenol as needed- ? R hip pain , cannot reproduce PT will monitor while pt in therapy  4. Mood:Resume Lexapro 10 mg daily -antipsychotic agents: N/A 5. Neuropsych: This patientiscapable of making decisions on hisown behalf. 6. Skin/Wound Care:Routine skin checks 7. Fluids/Electrolytes/Nutrition:Routine in and outs with follow-up chemistries 8. Dysphagia. Carb modified nectar thick liquid.   -advance per speech therapy 9. Diabetes mellitus with obesity,peripheral neuropathy. Hemoglobin A1c 8.5.   -Home regimen:NovoLog 12units 3 times daily, Lantus insulin 40units twice daily.  -1/9 sugars remain elevated    -begin lantus 10u bid CBG (last 3)  Recent Labs    04/10/20 1656 04/10/20 2041 04/11/20 0558  GLUCAP 271* 284* 232*  increase lantus to 20U BID  10. AKI on CKD stage III. Baseline creatinine 1.1-1.4. Follow-up chemistries 11. Permissive hypertension. Presently on Toprol-XL 25 mg daily. Patient on Norvasc 10 mg nightly, Cozaar 100 mg daily, Imdur 30 mg nightly prior to admission.   Vitals:   04/10/20 1953 04/11/20 0503  BP: (!) 142/60 (!) 163/70  Pulse: 72 74  Resp: 18 18  Temp: 98.3 F (36.8 C) (!) 97.5 F (36.4 C)  SpO2: 99% 94%  poor control of systolic will add Imdur  which should help   12. Hyperlipidemia. Zetia/Crestor 13. CAD with stenting. Continue aspirin and Plavix 14. Left BKA 2015. Patient does have a prosthesis which appears to be in working order but is due for replacement soon per pt([employer])    LOS: 4 days A FACE TO FACE EVALUATION WAS PERFORMED  06/09/20 04/11/2020, 9:07 AM

## 2020-04-11 NOTE — Progress Notes (Signed)
Occupational Therapy Session Note  Patient Details  Name: Jordan [name] MRN: [id number] Date of Birth: 1940/08/26  Today's Date: 04/11/2020 OT Individual Time: 3007-6226 OT Individual Time Calculation (min): 70 min    Short Term Goals: Week 1:  OT Short Term Goal 1 (Week 1): Pt will complete LB dressing with min assist using AE as needed. OT Short Term Goal 2 (Week 1): Pt will complete UB/LB bathing with min assist using shower bench and AE as needed. OT Short Term Goal 3 (Week 1): Pt will complete toileting with min assist. OT Short Term Goal 4 (Week 1): Pt will complete toilet transfer with CGA.  Skilled Therapeutic Interventions/Progress Updates:    Pt received in w/c with wife present, agreeable to therapy. Session focus on dynamic standing balance, activity tolerance, and LB dressing AE. Transported to gym 2/2 time management. Stand-pivot to mat with min A for initial lift + RW. In standing, crossed midline to retrieve and place velcro cards at head and knee height with overall min A for balance + to facilitate R knee extension during weight shift + RW. No noted R inattention. Pt able to tolerate standing for ~1 min before req seated rest break. Completed standing marches for 2 bouts, tolerating first 30 seconds, then 1 min in standing with min A for balance. Seated, caught and tossed ball outside of BOS with distant S, initial difficulty coordinating BUE to toss ball at appropriate height. Transitioned to standing to catch/toss ball within BOS, intermittent mod A for balance as pt with noted posterior weight shift + intermittent TCs to facilitate R knee extension. Pt able to stand for total of ~1.5 min before req seated rest break. Finally, trialed reacher to assist with LB dressing, donned scrub pants over R leg with overall mod A to bring pant leg past R heel. Stand-pivot back to w/c then back to bed same manner as before. Doffed LLE prosthetic with min A to push button, doffed R shoe with  total A. Sit > supine with mod A to bring RLE onto bed + to readjust trunk in bed. Left semi-reclined in bed, call bell in reach, bed alarm engaged, and wife present. Overall noted improved upright posture this session.   Therapy Documentation Precautions:  Precautions Precautions: Fall Precaution Comments: L prosthetic Restrictions Weight Bearing Restrictions: No Pain: Pain Assessment Pain Scale: 0-10 Pain Score: 0-No pain ADL: See Care Tool for more details. Therapy/Group: Individual Therapy  Claudie Revering, MS, OTR/L Perrin Maltese OTR/L  04/11/2020, 3:30 PM

## 2020-04-11 NOTE — Progress Notes (Signed)
Inpatient Rehabilitation Center Individual Statement of Services  Patient Name:  Jordan [name]  Date:  04/11/2020  Welcome to the Inpatient Rehabilitation Center.  Our goal is to provide you with an individualized program based on your diagnosis and situation, designed to meet your specific needs.  With this comprehensive rehabilitation program, you will be expected to participate in at least 3 hours of rehabilitation therapies Monday-Friday, with modified therapy programming on the weekends.  Your rehabilitation program will include the following services:  Physical Therapy (PT), Occupational Therapy (OT), Speech Therapy (ST), 24 hour per day rehabilitation nursing, Therapeutic Recreaction (TR), Neuropsychology, Care Coordinator, Rehabilitation Medicine, Nutrition Services, Pharmacy Services and Other  Weekly team conferences will be held on Wednesdays to discuss your progress.  Your Inpatient Rehabilitation Care Coordinator will talk with you frequently to get your input and to update you on team discussions.  Team conferences with you and your family in attendance may also be held.  Expected length of stay: 10-14 Days  Overall anticipated outcome: Supervision  Depending on your progress and recovery, your program may change. Your Inpatient Rehabilitation Care Coordinator will coordinate services and will keep you informed of any changes. Your Inpatient Rehabilitation Care Coordinator's name and contact numbers are listed  below.  The following services may also be recommended but are not provided by the Inpatient Rehabilitation Center:    Home Health Rehabiltiation Services  Outpatient Rehabilitation Services    Arrangements will be made to provide these services after discharge if needed.  Arrangements include referral to agencies that provide these services.  Your insurance has been verified to be:  [employer] Your primary doctor is:  [name]  Pertinent information will  be shared with your doctor and your insurance company.  Inpatient Rehabilitation Care Coordinator:  Lavera Guise, [place] [phone] or [phone]  Information discussed with and copy given to patient by: Andria Rhein, 04/11/2020, 11:01 AM

## 2020-04-11 NOTE — Progress Notes (Signed)
Physical Therapy Session Note  Patient Details  Name: Jordan [name] MRN: [id number] Date of Birth: 01-19-1941  Today's Date: 04/11/2020 PT Individual Time: 1110-1205 PT Individual Time Calculation (min): 55 min   Short Term Goals: Week 1:  PT Short Term Goal 1 (Week 1): Pt will performed bed mobiltiy with min assist PT Short Term Goal 2 (Week 1): Pt will trasnfer to and from [facility] with min assist PT Short Term Goal 3 (Week 1): Pt will propell WC 155ft with min assist PT Short Term Goal 4 (Week 1): Pt will ambulate 68ft with min assist and LRAD  Skilled Therapeutic Interventions/Progress Updates:    Pt received supine in bed with his wife present and pt agreeable to therapy session. Pt noted to have soiled brief and upon questioning pt states it is "clean" - therapist made pt aware and assisted with peri-care at bed level via R/L rolling using bedrails with mod assist during total assist LB clothing management and peri-care - attempted to engage pt in peri-care but he was very lethargic quickly falling asleep during this task - noted to have small skin tear on [name] RN aware. When pt more awake/alert he demonstrates some impulsivity via initiating mobility prior to therapist being in position to assist as well as decreased safety awareness by not scooting hips over prior to initiating rolling in bed causing him to be too close to EOB requiring max cuing for correction. Supine>sitting R EOB, flat bed but using bedrail for trunk support, heavy min assist for trunk upright. Sitting EOB with close supervision/intermittent CGA for sitting balance donned pants min assist, shirt set-up assist with increased time to sequence/orient shirt, and L LE prosthetic with set-up assist (pt already wearing sleeve and 1 sock). Sit>stand EOB>RW with min assist for balance with pt demoing slight R lean but able to maintain balance with min assist while pulling pants up over hips. Gait ~74ft x2 in/out bathroom  using RW with mod assist of 1 for balance and AD management to prevent pt from pushing it too far forward, +2 present for safety - demos decreased R weight shift during stance causing decreased L LE foot clearance and step length with occasional shuffled gait. Max cuing for sequencing and assist for AD management when stepping up/down bathroom door threshold. Standing using RW support as needed with min assist performed LB clothing management with min assist - attempted to void further on BSC over toilet as felt pt still voiding BM during supine peri-care but pt unable to continently void further. Therapist retrieved pt a roho w/c cushion to provide improved pressure relief and increased upright, OOB activity tolerance. Pt left sitting in w/c with needs in reach, seat belt alarm on, and his wife present.  Therapy Documentation Precautions:  Precautions Precautions: Fall Precaution Comments: L prosthetic Restrictions Weight Bearing Restrictions: No  Pain: Upon questioning, pt reports R hip pain is "much better" today with no reports of pain during session.  Therapy/Group: Individual Therapy  Ginny Forth , PT, DPT, CSRS  04/11/2020, 12:51 PM

## 2020-04-12 ENCOUNTER — Inpatient Hospital Stay (HOSPITAL_COMMUNITY): Payer: No Typology Code available for payment source | Admitting: Occupational Therapy

## 2020-04-12 ENCOUNTER — Inpatient Hospital Stay (HOSPITAL_COMMUNITY): Payer: No Typology Code available for payment source | Admitting: Physical Therapy

## 2020-04-12 ENCOUNTER — Inpatient Hospital Stay (HOSPITAL_COMMUNITY): Payer: No Typology Code available for payment source | Admitting: Speech Pathology

## 2020-04-12 LAB — GLUCOSE, CAPILLARY
Glucose-Capillary: 199 mg/dL — ABNORMAL HIGH (ref 70–99)
Glucose-Capillary: 345 mg/dL — ABNORMAL HIGH (ref 70–99)
Glucose-Capillary: 288 mg/dL — ABNORMAL HIGH (ref 70–99)
Glucose-Capillary: 184 mg/dL — ABNORMAL HIGH (ref 70–99)

## 2020-04-12 MED ORDER — INSULIN GLARGINE 100 UNIT/ML ~~LOC~~ SOLN
25.0000 [IU] | Freq: Two times a day (BID) | SUBCUTANEOUS | Status: DC
  Administered 2020-04-12 – 2020-04-14 (×4): 25 [IU] via SUBCUTANEOUS
  Filled 2020-04-12 (×7): qty 0.25

## 2020-04-12 NOTE — Progress Notes (Signed)
Speech Language Pathology Daily Session Note  Patient Details  Name: Jordan [name] MRN: [id number] Date of Birth: [birth year]  Today's Date: 04/12/2020 SLP Individual Time: 1104-1200 SLP Individual Time Calculation (min): 56 min  Short Term Goals: Week 1: SLP Short Term Goal 1 (Week 1): Pt will participate in pharyngeal exercises (Mendelsohn and Supraglottic swallow) to increse timing of swallow initation and consume trials of thin liquids with vital s/s remaining WFL and minimal overt s/s aspiration prior to repeat instrumental swallow assessment. SLP Short Term Goal 2 (Week 1): Pt will demonstrate sustained attention in 30 minute intervals with supervision A verbal cues for redrirection to task. SLP Short Term Goal 3 (Week 1): Pt will coordinate phonation and respiration at the phrase level with supervision A verbal cues. SLP Short Term Goal 4 (Week 1): Pt will complete functional mildly complex problem solving tasks with min A verbal cues. SLP Short Term Goal 5 (Week 1): Pt will self-monitor and self-correct functional errors with supervision A verbal cues. SLP Short Term Goal 6 (Week 1): Pt will demonstrate recall of novel information with supervision A verbal cues.  Skilled Therapeutic Interventions: Pt was seen for skilled ST targeting dysphagia and cognitive goals. Pt was more fatigued this session, initially, requiring cues for sequencing and awareness of errors during oral care at the sink. However, after extra time, oral care, and repositioning, he was alert enough for trials of thin liquids. No overt s/sx aspiration noted across 15 ice chips, however pt exhibited some coughing when using thin H2O during oral care, in addition to 2 immediate coughs and 1 delayed cough when taking sips from the cup. He required Mod A cues to recall and Min A cues for implement strategies to limit himself to 1 sip at a time (as opposed to consecutive sips). Recommend continue current diet, and ST will continue  to assess readiness for repeat MBSS. Pt also required Min A verbal cueing to recall target of last ST session (medication management). SLP further facilitated session with fluctuating Min-Mod levels of verbal and visual cueing for verbal problem solving and error awareness to complete semi-complex functional daily math problems from the ALFA. Pt left sitting upright in wheelchair with seatbelt alarm set and needs within reach. Continue per current plan of care.       Pain Pain Assessment Pain Scale: 0-10 Pain Score: 0-No pain  Therapy/Group: Individual Therapy  [name] 04/12/2020, 7:20 AM

## 2020-04-12 NOTE — Progress Notes (Signed)
Physical Therapy Session Note  Patient Details  Name: Jordan [name] MRN: [id number] Date of Birth: 1940/07/14  Today's Date: 04/12/2020 PT Individual Time: 0811-0920 PT Individual Time Calculation (min): 69 min   Short Term Goals: Week 1:  PT Short Term Goal 1 (Week 1): Pt will performed bed mobiltiy with min assist PT Short Term Goal 2 (Week 1): Pt will trasnfer to and from [facility] with min assist PT Short Term Goal 3 (Week 1): Pt will propell WC 17ft with min assist PT Short Term Goal 4 (Week 1): Pt will ambulate 13ft with min assist and LRAD  Skilled Therapeutic Interventions/Progress Updates:    Pt received supine in bed having finished breakfast and agreeable to therapy session. Upon inquiring pt if he needed to use bathroom pt declined unaware that he had soiled through to the bed sheets - therapist made pt aware and educated on importance of notifying nursing staff in event of an incontinent episodes to maintain skin integrity. Supine>sitting R EOB, HOB flat and not using bedrails, with heavy min assist for trunk upright and pivoting hips - therapist cuing for sequencing to increase pt independence. Donned L LE prosthetic with min assist - appears pt had worn L amputee sleeve and sock throughout hte night - educated pt on removing at night and redonning in AM (NT made aware to assist pt with remembering to do this). Sit>stand EOB>RW with min assist for lifting to stand. Gait training ~80ft, ~62ft in/out of bathroom using RW with min/mod assist for balance and AD management as pt pushes RW too far forward, continues to demo shuffled gait pattern with decreased L weight shift and poor R LE foot clearance and step length - max cuing for sequencing AD and LE stepping over doorway threshold. Sit<>stands BSC<>RW using grab bar support with min assist for lifting/lowering and balance due to R lean. Pt unable to further void continently on BSC over toilet. Pt unaware that he had been incontinent of bowels  and reports being unable to feel the stool. Sitting on BSC over toilet performed peri-care and hygiene to clean soiled areas with max assist for time management - completed in standing with pt requiring L UE support on grab bar and min/mod assist for balance to prevent R lateral LOB. Sitting on BSC over toilet donned clean shirt set-up assist and clean pants with min/mod assist - returned to standing with same UE support on grab bar and assist while pulling pants up over hips with mod assist. Gait training to sink and performed standing hand hygiene with pt demoing poor dual-task abilities causing him to progress into a squat posture while standing with poor standing tolerance/actiivty tolerance requiring seated rest break prior to being able to complete hand hygiene.  Performed standing oral care with pt continuing to demonstrate impaired standing tolerance. Seated face washing with set-up assist. Transported to/from gym in w/c for time management and energy conservation. Gait training 63ft, 48ft x2 using RW with min assist for balance and AD management - continues to demo decreased L weight shift and decreased R LE foot clearance and step length - demos shuffled stepping bilaterally with toes hitting on initial contact - cuing and facilitation for increased L weight shift and increased R step length (used blue theraband on R front of RW as external target to increase step length with minimal improvement). Standing R LE NMR via L UE support on RW only while performing repeated R LE foot taps on/off 3" step - min/light mod assist  for balance and cuing for sustained L weight shift while stepping R foot - did not allow pt to use R UE support on RW to prevent UE compensation - performed this prior to last ambulation trial (76ft) with pt demoing significant improvement with reciprocal stepping and increased step lengths. Transported back to room and pt agreeable to remain sitting in w/c - left with needs in reach and  seat belt alarm on.  Therapy Documentation Precautions:  Precautions Precautions: Fall Precaution Comments: L prosthetic Restrictions Weight Bearing Restrictions: No  Pain:  Denies pain during session.   Therapy/Group: Individual Therapy  Ginny Forth , PT, DPT, CSRS  04/12/2020, 7:43 AM

## 2020-04-12 NOTE — Patient Care Conference (Signed)
Inpatient RehabilitationTeam Conference and Plan of Care Update Date: 04/12/2020   Time: 10:38 AM    Patient Name: Jordan [name]      Medical Record Number: [id number]  Date of Birth: 1941-03-18 Sex: Male         Room/Bed: 4W06C/4W06C-01 Payor Info: Payor: [employer] MEDICARE / Plan: [employer] MEDICARE CHOICE PPO / Product Type: *No Product type* /    Admit Date/Time:  04/07/2020  2:32 PM  Primary Diagnosis:  Infarction of left basal ganglia [facility])  Hospital Problems: Principal Problem:   Infarction of left basal ganglia (HCC) Active Problems:   Diabetes mellitus type 2 in obese [facility])    Expected Discharge Date: Expected Discharge Date: 04/26/20  Team Members Present: Physician leading conference: Dr. Claudette Laws Care Coodinator Present: Chana Bode, RN, BSN, CRRN;Christina Vita Barley, BSW Nurse Present: Other (comment) Freddrick March, RN) PT Present: Casimiro Needle, PT OT Present: Perrin Maltese, OT SLP Present: Suzzette Righter, CF-SLP PPS Coordinator present : Fae Pippin, [place], PT     Current Status/Progress Goal Weekly Team Focus  Bowel/Bladder   pt is incont x2, LBM 04/11/20  Pt will be continent  Q2h toileting/PRN   Swallow/Nutrition/ Hydration   regular textures, nectar thick liquids, Min A  Supervision least restrictive diet  thin trials, tolerance current diet, repeat MBSS, carryover swallow strategies   ADL's   mod-max A LB ADLs; supervision UB ADLs; min A functional transfers  Supervision  RUE NMR, selfcare retraining, balance retraining, activity tolerance, pt/fam edu, DME/AE edu, functional transfer retraining, functional cognition   Mobility   mod assist bed mobility, min/mod assist sit<>stand and stand pivot transfers using RW, mod assist gait up to 86ft using RW  supervision overall  bed mobility training, transfer training, gait training, activity tolerance, dynamic standing balance, pt/family education   Communication   Mod A increased vocal  intensity  Supervision  compensatory strategies for intelligibility   Safety/Cognition/ Behavioral Observations  Min-Mod A dependig on task  Supervision  basic to mildly complex problem solving, daily recall, selective attention, emergent and anticipatory awareness   Pain   Pt denies pain at this time  Pt will remain free of pain  Assess pain Qshift/PRN   Skin   Pt ahs sm abrasion to penis from previous condom cath.  Pt will be free of further breakdown and infection.  Assess skin qshift/PRN     Discharge Planning:  Goal to d/c home with spouse. 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 Incontinent of bowel and bladder with abrasion to penis. Progress limited by poor dual tasks management and stooped posture with poor endurance and slow processing. Patient on target to meet rehab goals: Currently mod - max assist for lower body care and supervision for upper body care. Min - mod assist for pivot transfers and leaning right with loss of balance to the right. Min assist for basic and mod assist for complex problem solving error awareness, anticipatory awareness and memory.  *See Care Plan and progress notes for long and short-term goals.   Revisions to Treatment Plan:  Timed toileting schedule initiated Thin liquid trials with MBS later in the week Encourage vocal intensity for intelligibility   Teaching Needs: Transfers, toileting, medications, problem solving assistance, etc.  Current Barriers to Discharge: None noted, wife able to provide 24/7 care and ramped entry to home available  Possible Resolutions to Barriers: Family education     Medical Summary Current  Status: bowel and bladder incont, poor awareness, hx of Left BKA, severe diabetic neuropathy  Barriers to Discharge: Incontinence;Medical stability   Possible Resolutions to [employer] Focus: adjust diabetic meds to control CBG, timed  toileting   Continued Need for Acute Rehabilitation Level of Care: The patient requires daily medical management by a physician with specialized training in physical medicine and rehabilitation for the following reasons: Direction of a multidisciplinary physical rehabilitation program to maximize functional independence : Yes Medical management of patient stability for increased activity during participation in an intensive rehabilitation regime.: Yes Analysis of laboratory values and/or radiology reports with any subsequent need for medication adjustment and/or medical intervention. : Yes   I attest that I was present, lead the team conference, and concur with the assessment and plan of the team.   Pamelia Hoit 04/12/2020, 3:24 PM

## 2020-04-12 NOTE — Progress Notes (Signed)
[place] PHYSICAL MEDICINE & REHABILITATION PROGRESS NOTE   Subjective/Complaints:  Pt working with PT, difficulty with Wt shifting , fatigues easily incont bowel and bladder  ROS: Patient deniesCP, SOB, N/V/DObjective:   No results found. Recent Labs    04/10/20 0621  WBC 10.2  HGB 13.0  HCT 38.2*  PLT 240   Recent Labs    04/10/20 0621  NA 137  K 3.9  CL 101  CO2 25  GLUCOSE 243*  BUN 13  CREATININE 1.14  CALCIUM 8.9    Intake/Output Summary (Last 24 hours) at 04/12/2020 0912 Last data filed at 04/12/2020 0830 Gross per 24 hour  Intake 440 ml  Output --  Net 440 ml        Physical Exam: Vital Signs Blood pressure 136/74, pulse 74, temperature 99.5 F (37.5 C), resp. rate 19, height '5\' 9"'  (1.753 m), weight 98.3 kg, SpO2 97 %.   General: No acute distress Mood and affect are appropriate Heart: Regular rate and rhythm no rubs murmurs or extra sounds Lungs: Clear to auscultation, breathing unlabored, no rales or wheezes Abdomen: Positive bowel sounds, soft nontender to palpation, nondistended Extremities: No clubbing, cyanosis, or edema Skin: No evidence of breakdown, no evidence of rash   Neuro: Pt is groggy from sleep but answers appropriatelyRight central 7 with dysarthria ongoing.  Sensory exam is normal. Reflexes are 2+ in all 4's. No tremors. RUE 4-/5 prox to distal with PD. RLE 4/5--motor exam unchanged today. 5/5 LUE and LLE.   Musculoskeletal: no pain with Right hip ROM or with AROM, no pain to palp over greater troch       Assessment/Plan: 1. Functional deficits which require 3+ hours per day of interdisciplinary therapy in a comprehensive inpatient rehab setting.  Physiatrist is providing close team supervision and 24 hour management of active medical problems listed below.  Physiatrist and rehab team continue to assess barriers to discharge/monitor patient progress toward functional and medical goals  Care Tool:  Bathing    Body  parts bathed by patient: Right arm,Right upper leg,Left arm,Left upper leg,Chest,Right lower leg,Abdomen,Front perineal area,Face,Buttocks     Body parts n/a: Left lower leg (LLE BKA)   Bathing assist Assist Level: Minimal Assistance - Patient > 75%     Upper Body Dressing/Undressing Upper body dressing   What is the patient wearing?: Pull over shirt    Upper body assist Assist Level: Supervision/Verbal cueing    Lower Body Dressing/Undressing Lower body dressing      What is the patient wearing?: Pants     Lower body assist Assist for lower body dressing: Moderate Assistance - Patient 50 - 74%     Toileting Toileting    Toileting assist Assist for toileting: Minimal Assistance - Patient > 75%     Transfers Chair/bed transfer  Transfers assist     Chair/bed transfer assist level: Minimal Assistance - Patient > 75% Chair/bed transfer assistive device: Programmer, multimedia   Ambulation assist      Assist level: Moderate Assistance - Patient 50 - 74% Assistive device: Walker-rolling Max distance: 39f   Walk 10 feet activity   Assist     Assist level: Moderate Assistance - Patient - 50 - 74% Assistive device: Walker-rolling   Walk 50 feet activity   Assist Walk 50 feet with 2 turns activity did not occur: Safety/medical concerns         Walk 150 feet activity   Assist Walk 150 feet activity did not  occur: Safety/medical concerns         Walk 10 feet on uneven surface  activity   Assist Walk 10 feet on uneven surfaces activity did not occur: Safety/medical concerns         Wheelchair     Assist Will patient use wheelchair at discharge?: Yes Type of Wheelchair: Manual    Wheelchair assist level: Contact Guard/Touching assist Max wheelchair distance: 100    Wheelchair 50 feet with 2 turns activity    Assist        Assist Level: Contact Guard/Touching assist   Wheelchair 150 feet activity      Assist      Assist Level: Moderate Assistance - Patient 50 - 74%   Blood pressure 136/74, pulse 74, temperature 99.5 F (37.5 C), resp. rate 19, height '5\' 9"'  (1.753 m), weight 98.3 kg, SpO2 97 %.  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 Formulized individual treatment plan based on medical history, underlying problem and comorbidities.  2. Antithrombotics: -DVT/anticoagulation:Lovenox -antiplatelet therapy: Aspirin 81 mg daily and Plavix 75 mg daily x3 weeks then Plavix alone 3. Pain Management:Tylenol as needed- ? R hip pain , cannot reproduce PT will monitor while pt in therapy  4. Mood:Resume Lexapro 10 mg daily -antipsychotic agents: N/A 5. Neuropsych: This patientiscapable of making decisions on hisown behalf. 6. Skin/Wound Care:Routine skin checks 7. Fluids/Electrolytes/Nutrition:Routine in and outs with follow-up chemistries 8. Dysphagia. Carb modified nectar thick liquid.   -advance per speech therapy 9. Diabetes mellitus with obesity,peripheral neuropathy. Hemoglobin A1c 8.5.   -Home regimen:NovoLog 12units 3 times daily, Lantus insulin 40units twice daily.  -1/9 sugars remain elevated    -restarted  lantus at 10u bid CBG (last 3)  Recent Labs    04/11/20 1638 04/11/20 2102 04/12/20 0702  GLUCAP 300* 236* 199*  increase lantus to 25U BID- in hospital CBG goal 110-180  10. AKI on CKD stage III. Baseline creatinine 1.1-1.4. Follow-up chemistries 11. Permissive hypertension. Presently on Toprol-XL 25 mg daily. Patient on Norvasc 10 mg nightly, Cozaar 100 mg daily, Imdur 30 mg nightly prior to  admission.   Vitals:   04/11/20 1930 04/12/20 0416  BP: 110/66 136/74  Pulse: 84 74  Resp: 18 19  Temp: 98.5 F (36.9 C) 99.5 F (37.5 C)  SpO2: 92% 97%  good control 1/12 12. Hyperlipidemia. Zetia/Crestor 13. CAD with stenting. Continue aspirin and Plavix 14. Left BKA 2015. Patient does have a prosthesis which appears to be in working order but is due for replacement soon per pt([employer])    LOS: 5 days A FACE TO FACE EVALUATION WAS PERFORMED  Charlett Blake 04/12/2020, 9:12 AM

## 2020-04-12 NOTE — Progress Notes (Signed)
Occupational Therapy Session Note  Patient Details  Name: Jordan [name] MRN: [id number] Date of Birth: 03/13/1941  Today's Date: 04/12/2020 OT Individual Time: 1421-1535 OT Individual Time Calculation (min): 74 min    Short Term Goals: Week 1:  OT Short Term Goal 1 (Week 1): Pt will complete LB dressing with min assist using AE as needed. OT Short Term Goal 2 (Week 1): Pt will complete UB/LB bathing with min assist using shower bench and AE as needed. OT Short Term Goal 3 (Week 1): Pt will complete toileting with min assist. OT Short Term Goal 4 (Week 1): Pt will complete toilet transfer with CGA.  Skilled Therapeutic Interventions/Progress Updates:    Pt received asleep in w/c, easily awakened with TCs, agreeable to therapy. Session focus on showering, toileting, dressing, seated grooming. Sit> stand with RW + min A for initial lift. Amb to shower with RW + mod A for balance + max VCs to advance RLE, safe RW use (pt has tendency to push RW too far forward). Pt with noted difficulty with R foot clearance and decreased step length despite VCs. TTB transfer with RW + R grab bar + mod A for balance. Doffed shirt, LLE prosthetic, pants, and brief with close S in sitting with lateral leans. Total A to doff/donn R sock/shoe. Bathed UB with long-handled sponge with close S, LB with long-handled sponge and min A to bathe buttocks with lateral leans. Redoffed LLE prosthetic with close S. Amb to toilet and completed toilet transfer with RW + mod A for balance. Continent void of bowels. Mod A for balance during posterior peri care + thoroughness. Pt has difficulty coming into fully erect posture, with noted trunk flexion and difficulty extending R knee. Has difficulty anterior shifting weight. Seated in w/c at sink, donned shirt with S, donned brief/pants with min A for sit<>stand + to pull over B hips. Shaved face seated in w/c with close S, unable to maintain upright posture off of back of w/c despite  consistent VCs. Amb back to bed with min A + RW. Applied lotion to R ankle with min A to hold figure-four position. Sitting EOB > supine with min A to bring RLE onto bed. Pt with noted difficulty in recalling PLOF with sequencing ADL tasks (unable to ind recall in what order he took off his prosthetic to bathe/dress req mod VCs to sequence). Pt left semi-reclined in bed with bed alarm engaged, call bell in reach, and all immediate needs met.    Therapy Documentation Precautions:  Precautions Precautions: Fall Precaution Comments: L prosthetic Restrictions Weight Bearing Restrictions: No   Pain: Pain Assessment Pain Scale: 0-10 Pain Score: 0-No pain ADL: See Care Tool for more details.  Therapy/Group: Individual Therapy  Volanda Napoleon, MS, OTR/L 04/12/2020, 12:52 PM   Clyda Greener OTR/L

## 2020-04-12 NOTE — Progress Notes (Signed)
Patient ID: Jordan [name], male   DOB: Oct 22, 1940, 80 y.o.   MRN: [id number] Team Conference Report to Patient/Family  Team Conference discussion was reviewed with the patient and caregiver, including goals, any changes in plan of care and target discharge date.  Patient and caregiver express understanding and are in agreement.  The patient has a target discharge date of 04/26/20.  Andria Rhein 04/12/2020, 1:12 PM

## 2020-04-13 ENCOUNTER — Inpatient Hospital Stay (HOSPITAL_COMMUNITY): Payer: No Typology Code available for payment source | Admitting: Occupational Therapy

## 2020-04-13 ENCOUNTER — Inpatient Hospital Stay (HOSPITAL_COMMUNITY): Payer: No Typology Code available for payment source

## 2020-04-13 ENCOUNTER — Inpatient Hospital Stay (HOSPITAL_COMMUNITY): Payer: No Typology Code available for payment source | Admitting: Speech Pathology

## 2020-04-13 LAB — GLUCOSE, CAPILLARY
Glucose-Capillary: 166 mg/dL — ABNORMAL HIGH (ref 70–99)
Glucose-Capillary: 262 mg/dL — ABNORMAL HIGH (ref 70–99)
Glucose-Capillary: 209 mg/dL — ABNORMAL HIGH (ref 70–99)
Glucose-Capillary: 232 mg/dL — ABNORMAL HIGH (ref 70–99)

## 2020-04-13 MED ORDER — AMLODIPINE BESYLATE 5 MG PO TABS
5.0000 mg | ORAL_TABLET | Freq: Every day | ORAL | Status: DC
  Administered 2020-04-13 – 2020-04-26 (×14): 5 mg via ORAL
  Filled 2020-04-13 (×14): qty 1

## 2020-04-13 NOTE — Progress Notes (Signed)
[place] [facility] PHYSICAL MEDICINE & REHABILITATION PROGRESS NOTE   Subjective/Complaints:  Discussed BP meds and d/c date  ROS: Patient deniesCP, SOB, N/V/DObjective:   No results found. No results for input(s): WBC, HGB, HCT, PLT in the last 72 hours. No results for input(s): NA, K, CL, CO2, GLUCOSE, BUN, CREATININE, CALCIUM in the last 72 hours.  Intake/Output Summary (Last 24 hours) at 04/13/2020 0846 Last data filed at 04/13/2020 0800 Gross per 24 hour  Intake 560 ml  Output -  Net 560 ml        Physical Exam: Vital Signs Blood pressure (!) 154/95, pulse 66, temperature 97.8 F (36.6 C), resp. rate 18, height 5\' 9"  (1.753 m), weight 98.3 kg, SpO2 95 %.  General: No acute distress Mood and affect are appropriate Heart: Regular rate and rhythm no rubs murmurs or extra sounds Lungs: Clear to auscultation, breathing unlabored, no rales or wheezes Abdomen: Positive bowel sounds, soft nontender to palpation, nondistended Extremities: No clubbing, cyanosis, or edema Skin: No evidence of breakdown, no evidence of rash   Neuro: Pt is groggy from sleep but answers appropriatelyRight central 7 with dysarthria ongoing.  Sensory exam is normal. Reflexes are 2+ in all 4's. No tremors. RUE 4-/5 prox to distal with PD. RLE 4/5--motor exam unchanged today. 5/5 LUE and LLE.   Musculoskeletal: no pain with Right hip ROM or with AROM, no pain to palp over greater troch       Assessment/Plan: 1. Functional deficits which require 3+ hours per day of interdisciplinary therapy in a comprehensive inpatient rehab setting.  Physiatrist is providing close team supervision and 24 hour management of active medical problems listed below.  Physiatrist and rehab team continue to assess barriers to discharge/monitor patient progress toward functional and medical goals  Care Tool:  Bathing    Body parts bathed by patient: Right arm,Right upper leg,Left arm,Left upper leg,Chest,Right lower  leg,Abdomen,Front perineal area,Face,Buttocks     Body parts n/a: Left lower leg (LLE BKA)   Bathing assist Assist Level: Minimal Assistance - Patient > 75%     Upper Body Dressing/Undressing Upper body dressing   What is the patient wearing?: Pull over shirt    Upper body assist Assist Level: Supervision/Verbal cueing    Lower Body Dressing/Undressing Lower body dressing      What is the patient wearing?: Pants,Incontinence brief     Lower body assist Assist for lower body dressing: Minimal Assistance - Patient > 75%     Toileting Toileting    Toileting assist Assist for toileting: Moderate Assistance - Patient 50 - 74%     Transfers Chair/bed transfer  Transfers assist     Chair/bed transfer assist level: Minimal Assistance - Patient > 75% Chair/bed transfer assistive device:   Ambulation assist      Assist level: Moderate Assistance - Patient 50 - 74% Assistive device: Walker-rolling Max distance: 7ft   Walk 10 feet activity   Assist     Assist level: Moderate Assistance - Patient - 50 - 74% Assistive device: Walker-rolling   Walk 50 feet activity   Assist Walk 50 feet with 2 turns activity did not occur: Safety/medical concerns  Assist level: Moderate Assistance - Patient - 50 - 74% Assistive device: Walker-rolling    Walk 150 feet activity   Assist Walk 150 feet activity did not occur: Safety/medical concerns         Walk 10 feet on uneven surface  activity   Assist Walk  10 feet on uneven surfaces activity did not occur: Safety/medical concerns         Wheelchair     Assist Will patient use wheelchair at discharge?: Yes Type of Wheelchair: Manual    Wheelchair assist level: Contact Guard/Touching assist Max wheelchair distance: 100    Wheelchair 50 feet with 2 turns activity    Assist        Assist Level: Contact Guard/Touching assist   Wheelchair 150 feet activity      Assist      Assist Level: Moderate Assistance - Patient 50 - 74%   Blood pressure (!) 154/95, pulse 66, temperature 97.8 F (36.6 C), resp. rate 18, height 5\' 9"  (1.753 m), weight 98.3 kg, SpO2 95 %.  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 Antithrombotics: -DVT/anticoagulation:Lovenox -antiplatelet therapy: Aspirin 81 mg daily and Plavix 75 mg daily x3 weeks then Plavix alone 3. Pain Management:Tylenol as needed- ? R hip pain , cannot reproduce PT will monitor while pt in therapy  4. Mood:Resume Lexapro 10 mg daily -antipsychotic agents: N/A 5. Neuropsych: This patientiscapable of making decisions on hisown behalf. 6. Skin/Wound Care:Routine skin checks 7. Fluids/Electrolytes/Nutrition:Routine in and outs with follow-up chemistries 8. Dysphagia. Carb modified nectar thick liquid.   -advance per speech therapy 9. Diabetes mellitus with obesity,peripheral neuropathy. Hemoglobin A1c 8.5.   -Home regimen:NovoLog 12units 3 times daily, Lantus insulin 40units twice daily.  -1/9 sugars remain elevated     CBG (last 3)  Recent Labs    04/12/20 1631 04/12/20 2103 04/13/20 0604  GLUCAP 288* 184* 166*  increase lantus to 25U BID 1/12- in hospital CBG goal 110-180  10. AKI on CKD stage III. Baseline creatinine 1.1-1.4. Follow-up chemistries 11. Permissive hypertension. Presently on Toprol-XL 25 mg daily. Patient on Norvasc 10 mg nightly, Cozaar 100 mg daily, Imdur 30 mg nightly prior to admission.   Vitals:   04/12/20 2149 04/13/20 0403  BP:  (!) 154/95  Pulse: 74 66  Resp: 17 18  Temp:  97.8 F (36.6 C)  SpO2: 98% 95%  mild elevation 1/13 Resume amlodipine 5mg   12. Hyperlipidemia. Zetia/Crestor 13. CAD with stenting.  Continue aspirin and Plavix 14. 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

## 2020-04-13 NOTE — Progress Notes (Signed)
Physical Therapy Session Note  Patient Details  Name: Jordan [name] MRN: [id number] Date of Birth: 21-Jul-1940  Today's Date: 04/13/2020 PT Individual Time: 5366-4403 PT Individual Time Calculation (min): 70 min   Short Term Goals: Week 1:  PT Short Term Goal 1 (Week 1): Pt will performed bed mobiltiy with min assist PT Short Term Goal 2 (Week 1): Pt will trasnfer to and from [facility] with min assist PT Short Term Goal 3 (Week 1): Pt will propell WC 143ft with min assist PT Short Term Goal 4 (Week 1): Pt will ambulate 29ft with min assist and LRAD  Skilled Therapeutic Interventions/Progress Updates:    Patient received sitting up in bed, agreeable to PT. He denies pain when asked. Patient able to come sit edge of bed with MinA. MinA to don pants to knees, supervision with verbal cues to don prosthetic. Patient able to come to stand with ModA to continue pulling pants up. MinA stand pivot to wc. Patient requesting to use bathroom. He was able to transfer to toilet via stand pivot with ModA, clothing management in standing with MinA. Patient found to have been incontinent of bowel in brief. Perihygiene in standing, initially ModA regressing to MaxA as patient fatigued. Progressing L lateral lean in standing with fatigue requiring increasing assist from PT to maintain balance. He required multiple seated rest breaks in order to complete this task. MinA clothing management in standing. Patient able to propel himself in wc x110ft with very short and inefficient strokes. PT providing multimodal cues to produce larger UE strokes for increased efficiency, however, patient unable to maintain this for longer than ~5 strokes. Patient able to ambulate ~49ft before requiring extended seated rest break. Decreased B step length, R >L with forward flexed posture, poor B foot clearance. Patient ambulating additional 68ft back to wc with progressively flexing B knees, likely due to fatigue. Patient transferring to NuStep via  stand pivot with MinA and completed 8 mins for improved B LE strength, muscle coordination and activation. Patient returning to room in wc, propelling himself ~132ft with CGA. Patient remaining up in wc, seatbelt alarm on, call light within reach.   Therapy Documentation Precautions:  Precautions Precautions: Fall Precaution Comments: L prosthetic Restrictions Weight Bearing Restrictions: No    Therapy/Group: Individual Therapy  Elizebeth Koller, PT, DPT, CBIS  04/13/2020, 7:43 AM

## 2020-04-13 NOTE — Progress Notes (Signed)
Speech Language Pathology Daily Session Note  Patient Details  Name: Jordan [name] MRN: [id number] Date of Birth: Mar 15, 1941  Today's Date: 04/13/2020 SLP Individual Time: 5638-7564 SLP Individual Time Calculation (min): 55 min  Short Term Goals: Week 1: SLP Short Term Goal 1 (Week 1): Pt will participate in pharyngeal exercises (Mendelsohn and Supraglottic swallow) to increse timing of swallow initation and consume trials of thin liquids with vital s/s remaining WFL and minimal overt s/s aspiration prior to repeat instrumental swallow assessment. SLP Short Term Goal 2 (Week 1): Pt will demonstrate sustained attention in 30 minute intervals with supervision A verbal cues for redrirection to task. SLP Short Term Goal 3 (Week 1): Pt will coordinate phonation and respiration at the phrase level with supervision A verbal cues. SLP Short Term Goal 4 (Week 1): Pt will complete functional mildly complex problem solving tasks with min A verbal cues. SLP Short Term Goal 5 (Week 1): Pt will self-monitor and self-correct functional errors with supervision A verbal cues. SLP Short Term Goal 6 (Week 1): Pt will demonstrate recall of novel information with supervision A verbal cues.  Skilled Therapeutic Interventions: Pt was seen for skilled ST targeting dysphagia and cognitive goals. SLP facilitated session with set up assist for completing oral care. Pt then accepted trials of ice chips and ~6oz thin H2O with 1 cough response noted after a large sip of thin. He did recall and implement 1-sip at a time strategy (inclduing physically setting cup down in between sips) with only Supervision  A level cues today. Recommend moving forward with repeat MBSS tomorrow 1/14 to reassess potential for advancement. Continue current diet for now. SLP further facilitated session with semi-complex time management based scenarios, during which pt required overall Min A verbal and visual cues for problem solving and error  awareness. He selectively attended to tasks with 30 minutes with overall Min A verbal cues for redirection. Pt left laying in bed with alarm set and needs within reach. Continue per current plan of care.      Pain Pain Assessment Pain Scale: 0-10 Pain Score: 0-No pain  Therapy/Group: Individual Therapy  [name] 04/13/2020, 7:25 AM

## 2020-04-13 NOTE — Progress Notes (Signed)
Occupational Therapy Session Note  Patient Details  Name: Jordan [name] MRN: [id number] Date of Birth: 02/14/1941  Today's Date: 04/13/2020 OT Individual Time: 1497-0263 OT Individual Time Calculation (min): 57 min    Short Term Goals: Week 1:  OT Short Term Goal 1 (Week 1): Pt will complete LB dressing with min assist using AE as needed. OT Short Term Goal 2 (Week 1): Pt will complete UB/LB bathing with min assist using shower bench and AE as needed. OT Short Term Goal 3 (Week 1): Pt will complete toileting with min assist. OT Short Term Goal 4 (Week 1): Pt will complete toilet transfer with CGA.  Skilled Therapeutic Interventions/Progress Updates:    Pt in the wheelchair to start session, just finishing his lunch.  Had him first work on wheelchair mobility for increased strengthening of the RUE.  He demonstrated decreased stroke length on the right side with increased turning into the wall on the right side as well.  Occasional min assist needed to help steer the wheelchair up to the nurses station.  Therapist then assisted him to the ADL apartment where he practiced walk-in shower transfers simulated.  He was able to step into the simulated shower with mod assist, but did not replicate stepping back with the walker as therapist demonstrated.  He instead turned sideways and stepped over with the LLE first and then tried to sit down before lifting the RLE in.  Therapist helped re-direct pt and then recommended use of the tub bench instead of the shower seat.  Therapist brought in tub bench and then had him complete mod assist transfer to and from the tub bench with use of the RW.  Both pt and therapist felt this was a better option.  Took pt back to the room for last part of session to work on toileting.   He was able to complete short distance functional mobility to the 3:1 with mod assist.  He needed mod assist for toilet hygiene with max assist for toilet clothing management.  He then completed  ambulation to the bed with mod assist.  Increased knee flexion and trunk flexion noted in standing.  He was able to remove his RLE prosthesis and then transfer to supine at supervision level.  Call button and phone in reach with safety alarm in place.    Therapy Documentation Precautions:  Precautions Precautions: Fall Precaution Comments: L prosthetic Required Braces or Orthoses: Other Brace Other Brace: L prosthetic Restrictions Weight Bearing Restrictions: No  Pain: Pain Assessment Pain Scale: Faces Pain Score: 0-No pain ADL: See Care Tool Section for some details of mobility and selfcare  Therapy/Group: Individual Therapy  Emerson Schreifels OTR/L 04/13/2020, 4:08 PM

## 2020-04-14 ENCOUNTER — Inpatient Hospital Stay (HOSPITAL_COMMUNITY): Payer: No Typology Code available for payment source | Admitting: Occupational Therapy

## 2020-04-14 ENCOUNTER — Encounter (HOSPITAL_COMMUNITY): Payer: No Typology Code available for payment source | Admitting: Speech Pathology

## 2020-04-14 ENCOUNTER — Inpatient Hospital Stay (HOSPITAL_COMMUNITY): Payer: No Typology Code available for payment source | Admitting: Physical Therapy

## 2020-04-14 ENCOUNTER — Inpatient Hospital Stay (HOSPITAL_COMMUNITY): Payer: Medicare PPO

## 2020-04-14 ENCOUNTER — Inpatient Hospital Stay (HOSPITAL_COMMUNITY): Payer: No Typology Code available for payment source

## 2020-04-14 LAB — GLUCOSE, CAPILLARY
Glucose-Capillary: 155 mg/dL — ABNORMAL HIGH (ref 70–99)
Glucose-Capillary: 147 mg/dL — ABNORMAL HIGH (ref 70–99)
Glucose-Capillary: 243 mg/dL — ABNORMAL HIGH (ref 70–99)
Glucose-Capillary: 166 mg/dL — ABNORMAL HIGH (ref 70–99)

## 2020-04-14 LAB — CREATININE, SERUM
Creatinine, Ser: 1.12 mg/dL (ref 0.61–1.24)
GFR, Estimated: >60 mL/min (ref >60)

## 2020-04-14 MED ORDER — INSULIN GLARGINE 100 UNIT/ML ~~LOC~~ SOLN
27.0000 [IU] | Freq: Two times a day (BID) | SUBCUTANEOUS | Status: DC
  Administered 2020-04-14 – 2020-04-15 (×2): 27 [IU] via SUBCUTANEOUS
  Filled 2020-04-14 (×4): qty 0.27

## 2020-04-14 NOTE — Progress Notes (Addendum)
Occupational Therapy Weekly Progress Note  Patient Details  Name: Jordan [name] MRN: [id number] Date of Birth: [birth year]  Beginning of progress report period: April 08, 2020 End of progress report period: April 17, 2020  Today's Date: 04/17/2020 OT Individual Time: 0272-5366 OT Individual Time Calculation (min): 27 min    Patient has met 2 of 4 short term goals.  Pt has made moderate progress with OT at this time, pt cont to be primarily limited by decreased dynamic standing balance and decreased ability to maintain upright posture during standing ADLs and functional transfers. Pt currently demonstrates increased trunk and R knee flexion in standing with R posterior weight shift. He also exhibits decreased strength in the LLE with decreased step length noted during transfers to the bathroom with use of the RW.  RUE functional use is at a diminished level with decreased coordination noted when attempting to complete BUE use such as propelling the wheelchair.  During selfcare tasks he is able to use it for assisting with pulling up pants and washing, but at a slower level compared to the LUE.  Pt is currently min A for LB ADLs; supervision seated UB ADLs; and min to mod A for functional transfers.   Patient continues to demonstrate the following deficits: muscle weakness, decreased cardiorespiratoy endurance, impaired timing and sequencing, unbalanced muscle activation, decreased coordination and decreased motor planning and decreased problem solving, decreased safety awareness and decreased memory and therefore will continue to benefit from skilled OT intervention to enhance overall performance with BADL and Reduce care partner burden.  Patient progressing toward long term goals..  Continue plan of care.  OT Short Term Goals Week 2:  OT Short Term Goal 1 (Week 2): Pt will complete toilet transfer with min guard A. OT Short Term Goal 2 (Week 2): Pt will complete toileting with min assist. OT  Short Term Goal 3 (Week 2): Pt will complete standing grooming for ~2 min with min A.  Skilled Therapeutic Interventions/Progress Updates:    Session 1: (1005-1100)  Pt in wheelchair to start session.  Had him focus on RUE strengthening throughout with wheelchair mobility to start.  Pt still with increased difficulty coordinating pushing the wheelchair as he will veer to the right secondary to not pushing hard or far enough with the RUE.  He pushed about 76' with overall min assist before therapist took over and pushed him down to the ortho gym.  He was then place on the UE ergonometer for further UE strengthening.  He was able to complete 3 sets of 5 mins each on level 7 resistance.  He completed all sets with RPMs maintained at 10-13 with one set peddling forward and then backwards.  Pt's O2 sats at 97% on room air with HR in the mid 70's.  Took him back the room at conclusion of arm bike and focused on use of the reacher and LH shoe horn for donning and donning his shoes.  He demonstrated increased difficulty coordinating use of the combination reacher shoe horn requiring overall mod to max assist to doff and donn.  Finished session with pt in the wheelchair and with the call button and phone in reach and safety belt in place.  Pt's nurse in the room as well.   Session 2: [phone])  Pt  Up in wheelchair to start session.  He was agreeable to try toilet transfer with use of the RW for support.  Noted decreased step length on the left side when ambulating as well  as increased trunk flexion with the RW positioned too far out in front of him.  He was able to complete clothing management and hygiene for toileting with mod assist but exhibited one LOB to the right when attempting secondary to right knee buckling.  He was able to ambulate back out to the EOB with min assist though once toileting was completed.  He worked on short distance mobility in the room to finish session with emphasis on leading with the  RLE and completing more reciprical gait.  He was able to walk from the bed to the door and back with min assist two times with min assist and greater step length.  He was then able to remove his LLE prosthesis as well as his right shoe with setup and transfer to supine at the same level.  Call button and phone in reach with safety alarm in place.   Therapy Documentation Precautions:  Precautions Precautions: Fall Precaution Comments: L prosthetic Required Braces or Orthoses: Other Brace Other Brace: L prosthetic Restrictions Weight Bearing Restrictions: No Pain: Pain Assessment Pain Scale: 0-10 Pain Score: 0-No pain ADL: See Care Tool for more details.   Therapy/Group: Individual Therapy  Evonda Enge, MS, OTR/L Clyda Greener OTR/L  04/17/2020, 4:11 PM

## 2020-04-14 NOTE — Progress Notes (Signed)
Modified Barium Swallow Progress Note  Patient Details  Name: Jordan [name] MRN: [id number] Date of Birth: 1940/11/01  Today's Date: 04/14/2020  Modified Barium Swallow completed.  Full report located under Chart Review in the Imaging Section.  Brief recommendations include the following:  Clinical Impression   Pt presents with mild oral and moderate pharyngeal dysphagia that is slightly improved from last MBS conducted 04/03/20. However, he still remains at moderate risk for aspiration. Thin barium was consistently penetrated to the level of the vocal folds (PAS score 5) and silently aspirated X2 (PAS score 8) throughout study today, primarily due to a delay in swallow initiation and incomplete laryngeal vestibule closure. Chin tuck compensatory maneuver was attempted with thin barium, however it was ineffective to provide additional airway closure, and actually resulted in larger volume of aspiration in comparison to when aspirated via neutral head position. Pt's volitional cough was weak, and only partially effective to clear penetrates and aspirates. Pt also exhibited decreased cohesion of boluses, as well as intermittent premature spillage into pharynx. Although, his oral control does appear improved since last MBS. Nectar thick barium also reached as low as the level of the pyriform sinuses prior to swallow initiation, however no aspiration or penetration into was observed. Recommend pt continue current diet with intermittent supervision - regular texture solids, nectar liquids, pills whole with nectar. ST will continue to provide skilled interventions to address oral and pharyngeal deficits and work toward diet advancement while inpatient.    Swallow Evaluation Recommendations       SLP Diet Recommendations: Nectar thick liquid;Regular solids   Liquid Administration via: Cup;Straw   Medication Administration: Whole meds with liquid (nectar liquids)   Supervision: Patient able to self  feed;Intermittent supervision to cue for compensatory strategies   Compensations: Slow rate;Small sips/bites;Clear throat intermittently   Postural Changes: Remain semi-upright after after feeds/meals (Comment)   Oral Care Recommendations: Oral care BID   Other Recommendations: Order thickener from pharmacy    [name] 04/14/2020,9:30 AM

## 2020-04-14 NOTE — Progress Notes (Signed)
Patient is daily weight but has not been weighed consistently. Last weight  01/07 98.3kg and today's was 71kg. Call bell at bedside.

## 2020-04-14 NOTE — Progress Notes (Signed)
[facility] PHYSICAL MEDICINE & REHABILITATION PROGRESS NOTE   Subjective/Complaints: The patient completed a modified barium swallow today.   ROS: Patient deniesCP, SOB, N/V/DObjective:   No results found. No results for input(s): WBC, HGB, HCT, PLT in the last 72 hours. Recent Labs    04/14/20 0009  CREATININE 1.12    Intake/Output Summary (Last 24 hours) at 04/14/2020 0843 Last data filed at 04/13/2020 1847 Gross per 24 hour  Intake 240 ml  Output -  Net 240 ml        Physical Exam: Vital Signs Blood pressure (!) 173/83, pulse 70, temperature 97.8 F (36.6 C), temperature source Oral, resp. rate 18, height 5\' 9"  (1.753 m), weight 71 kg, SpO2 97 %.  General: No acute distress Mood and affect are appropriate Heart: Regular rate and rhythm no rubs murmurs or extra sounds Lungs: Clear to auscultation, breathing unlabored, no rales or wheezes Abdomen: Positive bowel sounds, soft nontender to palpation, nondistended Extremities: No clubbing, cyanosis, or edema Skin: No evidence of breakdown, no evidence of rash  Neuro: Pt is groggy from sleep but answers appropriatelyRight central 7 with dysarthria ongoing.  Sensory exam is normal. Reflexes are 2+ in all 4's. No tremors. RUE 4-/5 prox to distal with PD. RLE 4/5--motor exam unchanged today. 5/5 LUE and LLE.   Musculoskeletal: no pain with Right hip ROM or with AROM, no pain to palp over greater troch       Assessment/Plan: 1. Functional deficits which require 3+ hours per day of interdisciplinary therapy in a comprehensive inpatient rehab setting.  Physiatrist is providing close team supervision and 24 hour management of active medical problems listed below.  Physiatrist and rehab team continue to assess barriers to discharge/monitor patient progress toward functional and medical goals  Care Tool:  Bathing    Body parts bathed by patient: Right arm,Right upper leg,Left arm,Left upper leg,Chest,Right lower  leg,Abdomen,Front perineal area,Face,Buttocks     Body parts n/a: Left lower leg (LLE BKA)   Bathing assist Assist Level: Minimal Assistance - Patient > 75%     Upper Body Dressing/Undressing Upper body dressing   What is the patient wearing?: Pull over shirt    Upper body assist Assist Level: Supervision/Verbal cueing    Lower Body Dressing/Undressing Lower body dressing      What is the patient wearing?: Incontinence brief     Lower body assist Assist for lower body dressing: Total Assistance - Patient < 25%     Toileting Toileting    Toileting assist Assist for toileting: Moderate Assistance - Patient 50 - 74%     Transfers Chair/bed transfer  Transfers assist     Chair/bed transfer assist level: Moderate Assistance - Patient 50 - 74% Chair/bed transfer assistive device:   Ambulation assist      Assist level: Moderate Assistance - Patient 50 - 74% Assistive device: Walker-rolling Max distance: 10   Walk 10 feet activity   Assist     Assist level: Moderate Assistance - Patient - 50 - 74% Assistive device: Walker-rolling   Walk 50 feet activity   Assist Walk 50 feet with 2 turns activity did not occur: Safety/medical concerns  Assist level: Moderate Assistance - Patient - 50 - 74% Assistive device: Walker-rolling    Walk 150 feet activity   Assist Walk 150 feet activity did not occur: Safety/medical concerns         Walk 10 feet on uneven surface  activity   Assist Walk  10 feet on uneven surfaces activity did not occur: Safety/medical concerns         Wheelchair     Assist Will patient use wheelchair at discharge?: Yes Type of Wheelchair: Manual    Wheelchair assist level: Contact Guard/Touching assist Max wheelchair distance: 100    Wheelchair 50 feet with 2 turns activity    Assist        Assist Level: Contact Guard/Touching assist   Wheelchair 150 feet activity      Assist      Assist Level: Moderate Assistance - Patient 50 - 74%   Blood pressure (!) 173/83, pulse 70, temperature 97.8 F (36.6 C), temperature source Oral, resp. rate 18, height 5\' 9"  (1.753 m), weight 71 kg, SpO2 97 %.  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 Antithrombotics: -DVT/anticoagulation:Lovenox -antiplatelet therapy: Aspirin 81 mg daily and Plavix 75 mg daily x3 weeks then Plavix alone 3. Pain Management:Tylenol as needed- ? R hip pain , cannot reproduce PT will monitor while pt in therapy  4. Mood:Resume Lexapro 10 mg daily -antipsychotic agents: N/A 5. Neuropsych: This patientiscapable of making decisions on hisown behalf. 6. Skin/Wound Care:Routine skin checks 7. Fluids/Electrolytes/Nutrition:Routine in and outs with follow-up chemistries 8. Dysphagia. Carb modified nectar thick liquid.   -advance per speech therapy 9. Diabetes mellitus with obesity,peripheral neuropathy. Hemoglobin A1c 8.5.   -Home regimen:NovoLog 12units 3 times daily, Lantus insulin 40units twice daily.  -1/9 sugars remain elevated     CBG (last 3)  Recent Labs    04/13/20 1622 04/13/20 1944 04/14/20 0558  GLUCAP 209* 232* 155*  increase lantus to 25U BID 1/12- in hospital CBG goal 110-180, will increase to 27 units on 1/14  10. AKI on CKD stage III. Baseline creatinine 1.1-1.4. Follow-up chemistries 11. Permissive hypertension. Presently on Toprol-XL 25 mg daily. Patient on Norvasc 10 mg nightly, Cozaar 100 mg daily, Imdur 30 mg nightly prior to admission.   Vitals:   04/13/20 2235 04/14/20 0501  BP:  (!) 173/83  Pulse: 74 70  Resp: 17 18  Temp:  97.8 F  (36.6 C)  SpO2: 94% 97%  Systolic elevation 1/14 Resume amlodipine 5mg   12. Hyperlipidemia. Zetia/Crestor 13. CAD with stenting. Continue aspirin and Plavix 14. Left BKA 2015. Patient does have a prosthesis which appears to be in working order but is due for replacement soon per pt([employer])    LOS: 7 days A FACE TO FACE EVALUATION WAS PERFORMED  2/14 04/14/2020, 8:43 AM

## 2020-04-14 NOTE — Progress Notes (Signed)
Occupational Therapy Session Note  Patient Details  Name: Mauri Tolen MRN: [id number] Date of Birth: 1940-11-01  Today's Date: 04/14/2020 OT Individual Time: 1300-1357 OT Individual Time Calculation (min): 57 min   Short Term Goals: Week 1:  OT Short Term Goal 1 (Week 1): Pt will complete LB dressing with min assist using AE as needed. OT Short Term Goal 2 (Week 1): Pt will complete UB/LB bathing with min assist using shower bench and AE as needed. OT Short Term Goal 3 (Week 1): Pt will complete toileting with min assist. OT Short Term Goal 4 (Week 1): Pt will complete toilet transfer with CGA.  Skilled Therapeutic Interventions/Progress Updates:     Pt greeted in the w/c, agreeable to participate in bathing/dressing tasks at the sink today, already toileted per pt report. OT placed bathing/clothing items on his Rt side to promote reaching with the affected Rt UE for NMR. Pt using his Rt hand at dominant level to meet demands of stated tasks, note that pt is slow motoricaly and required increased time to complete tasks at max level of independence. Able to lift his Rt LE into figure 4 position to wash foot and apply lotion. He declined bathing his residual limb. Discussed residual limb care at home with spouse Talbert Forest. Pt reports his spouse mostly assists with limb care. He does not have a LH mirror but would benefit from one for home use. He also would benefit from trialing a shoe horn to increase his functional independence with donning the Rt shoe. Pt completed shaving, oral care, and handwashing while seated, focusing on functional use/reaching of the affected Rt UE. He declined standing during oral care and handwashing when provided with opportunities to do so. At end of session pt remained sitting up in the w/c, left with all needs within reach and safety belt fastened.    Therapy Documentation Precautions:  Precautions Precautions: Fall Precaution Comments: L prosthetic Required Braces  or Orthoses: Other Brace Other Brace: L prosthetic Restrictions Weight Bearing Restrictions: No Vital Signs: Therapy Vitals Temp: 98 F (36.7 C) Pulse Rate: 75 Resp: 17 BP: (!) 116/58 Patient Position (if appropriate): Sitting Oxygen Therapy SpO2: 97 % O2 Device: Room Air Pain: pt reported having some soreness from lying in the bed but did not want anything to address it medicinally. He stated just sitting up OOB would help   ADL: ADL Grooming: Supervision/safety Where Assessed-Grooming: Sitting at sink Upper Body Bathing: Minimal assistance Where Assessed-Upper Body Bathing: Edge of bed Lower Body Bathing: Moderate assistance Where Assessed-Lower Body Bathing: Edge of bed Upper Body Dressing: Setup Where Assessed-Upper Body Dressing: Edge of bed Lower Body Dressing: Moderate assistance Where Assessed-Lower Body Dressing: Edge of bed (sit/stand level using RW)      Therapy/Group: Individual Therapy  Nickolai Rinks A Virat Prather 04/14/2020, 3:53 PM

## 2020-04-14 NOTE — Progress Notes (Signed)
Speech Language Pathology Weekly Progress Note  Patient Details  Name: Jordan [name] MRN: [id number] Date of Birth: July 13, 1940  Beginning of progress report period: April 07, 2020 End of progress report period: April 14, 2020   Short Term Goals: Week 1: SLP Short Term Goal 1 (Week 1): Pt will participate in pharyngeal exercises (Mendelsohn and Supraglottic swallow) to increse timing of swallow initation and consume trials of thin liquids with vital s/s remaining WFL and minimal overt s/s aspiration prior to repeat instrumental swallow assessment. SLP Short Term Goal 1 - Progress (Week 1): Partly met SLP Short Term Goal 2 (Week 1): Pt will demonstrate sustained attention in 30 minute intervals with supervision A verbal cues for redrirection to task. SLP Short Term Goal 2 - Progress (Week 1): Met SLP Short Term Goal 3 (Week 1): Pt will coordinate phonation and respiration at the phrase level with supervision A verbal cues. SLP Short Term Goal 3 - Progress (Week 1): Met SLP Short Term Goal 4 (Week 1): Pt will complete functional mildly complex problem solving tasks with min A verbal cues. SLP Short Term Goal 4 - Progress (Week 1): Met SLP Short Term Goal 5 (Week 1): Pt will self-monitor and self-correct functional errors with supervision A verbal cues. SLP Short Term Goal 5 - Progress (Week 1): Progressing toward goal SLP Short Term Goal 6 (Week 1): Pt will demonstrate recall of novel information with supervision A verbal cues. SLP Short Term Goal 6 - Progress (Week 1): Progressing toward goal    New Short Term Goals: Week 2: SLP Short Term Goal 1 (Week 2): Pt will participate in pharyngeal exercises (Mendelsohn and Supraglottic swallow) to increse timing of swallow initation and consume trials of thin liquids with vital s/s remaining WFL and minimal overt s/s aspiration prior to repeat instrumental swallow assessment. SLP Short Term Goal 2 (Week 2): Pt will selectively attention to  functional tasks for 30 minutes with Supervision A cues for redirection. SLP Short Term Goal 3 (Week 2): Pt will complete functional mildly complex to complex tasks with Supervision A verbal/visual cues for problem solving. SLP Short Term Goal 4 (Week 2): Pt will self-monitor and self-correct functional errors with supervision A verbal cues. SLP Short Term Goal 5 (Week 2): Pt will demonstrate recall of novel information with supervision A verbal cues. SLP Short Term Goal 6 (Week 2): Pt will use increased vocal intensity to increase his intelligibility at the sentence level with Supervision A verbal cues.  Weekly Progress Updates: Pt has made functional gains and met or partially met 4 out of 6 short term goals this reporting period. Pt is currently Min assist for basic to mildly complex tasks due to cognitive impairments impacting his short term memory, selective attention, problem solving, and emergent awareness. His voice and speech intelligibility has been improving, however still requires Min A verbal cues for intentional implementation of increased vocal intensity at times. Pt is consuming a regular texture nectar thick liquid diet and uses safe swallow strategies with Supervision-Min A cues. He did participate in a repeat MBS this week, however due to consistent penetration and silent aspiration of thin, he was unable to upgrade. ST will continue to target pharyngeal strengthening exercises and therapeutic trials to assess readiness for repeat testing prior to d/c. Pt and family education has been started (wife present for 1 session this week), however family would benefit from additional skilled ST education prior to d/c home. Pt would continue to benefit from skilled ST while inpatient  in order to maximize functional independence and reduce burden of care prior to discharge. Anticipate that pt will need 24/7 supervision at discharge in addition to [facility] follow up at next level of care.      Intensity:  Minumum of 1-2 x/day, 30 to 90 minutes Frequency: 3 to 5 out of 7 days Duration/Length of Stay: 04/26/20 Treatment/Interventions: Cueing hierarchy;Functional tasks;Medication managment;Cognitive remediation/compensation;Environmental controls;Internal/external aids;Patient/family education     Arbutus Leas, [place] 04/14/2020, 9:44 AM

## 2020-04-14 NOTE — Plan of Care (Signed)
  Problem: Consults Goal: RH STROKE PATIENT EDUCATION Description: See Patient Education module for education specifics  Outcome: Progressing Goal: Diabetes Guidelines if Diabetic/Glucose > 140 Description: If diabetic or lab glucose is > 140 mg/dl - Initiate Diabetes/Hyperglycemia Guidelines & Document Interventions  Outcome: Progressing   Problem: RH BOWEL ELIMINATION Goal: RH STG MANAGE BOWEL WITH ASSISTANCE Description: STG Manage Bowel with mod I Assistance. Outcome: Progressing   Problem: RH BLADDER ELIMINATION Goal: RH STG MANAGE BLADDER WITH ASSISTANCE Description: STG Manage Bladder With mod I Assistance Outcome: Progressing   Problem: RH KNOWLEDGE DEFICIT Goal: RH STG INCREASE KNOWLEDGE OF DIABETES Description: Pt will be able to demonstrate understanding of medication regimen, dietary and lifestyle modification to better control blood glucose levels with mod I assist using handouts and booklets provided.  Outcome: Progressing Goal: RH STG INCREASE KNOWLEDGE OF HYPERTENSION Description: Pt will be able to demonstrate understanding of medication regimen, dietary and lifestyle modification to better control blood pressure with mod I assist using handouts and booklets provided.  Outcome: Progressing Goal: RH STG INCREASE KNOWLEGDE OF HYPERLIPIDEMIA Description: Pt will be able to demonstrate understanding of medication regimen, dietary and lifestyle modification to better control blood cholesterol levels with mod I assist using handouts and booklets provided.  Outcome: Progressing Goal: RH STG INCREASE KNOWLEDGE OF STROKE PROPHYLAXIS Description: Pt will be able to demonstrate understanding of medication regimen, dietary and lifestyle modification to prevent stroke with mod I assist using handouts and booklets provided.  Outcome: Progressing

## 2020-04-14 NOTE — Progress Notes (Signed)
Physical Therapy Session Note  Patient Details  Name: Jordan [name] MRN: [id number] Date of Birth: [birth year]  Today's Date: 04/14/2020 PT Individual Time: 1100-1157 PT Individual Time Calculation (min): 57 min   Short Term Goals: Week 1:  PT Short Term Goal 1 (Week 1): Pt will performed bed mobiltiy with min assist PT Short Term Goal 2 (Week 1): Pt will trasnfer to and from [facility] with min assist PT Short Term Goal 3 (Week 1): Pt will propell WC 187f with min assist PT Short Term Goal 4 (Week 1): Pt will ambulate 59fwith min assist and LRAD  Skilled Therapeutic Interventions/Progress Updates:   Pt received supine in bed and agreeable to PT. Supine>sit transfer with min assist and cues for sequencing and use of bed rail to push in sitting. Once EOB PT able to don LLE prosthetic with increased time and min cues for proper sock placement. Clothing management with min assist to manage RLE into leg hole. Sit<>stand with supervision assist and elevated bed height. Min assist to pull pants to waist. [facility] and UE support on [facility].   Pt performed WC mobility x 12049fith supervision assist and min cues for increased force on the RUE to prevent hitting wall on the R.   Gait training with RW 2 x 51f30fth min assist from PT. Pt noted to have improved midline orientation on this day with only min cues for increased step width on the R side.   Pt performed 5xSTS: 47 and then 39 sec (>15 sec indicates increased fall risk) min assist throughout with cues for anterior weight shift and UE placement to push from WC.   Cross body and then lateral reaches performed to UE arch 2 x 12 BUE. CGA throughout with min cues for increased lateral weight shift each direction (L>R) with reaches outside BOS.   Patient returned to room and [facility] and all needs met.          Therapy Documentation Precautions:  Precautions Precautions:  Fall Precaution Comments: L prosthetic Required Braces or Orthoses: Other Brace Other Brace: L prosthetic Restrictions Weight Bearing Restrictions: No   Pain: Pain Assessment Pain Scale: 0-10 Pain Score: 0-No pain    Therapy/Group: Individual Therapy  AustLorie Phenix4/2022, 11:57 AM

## 2020-04-14 NOTE — Progress Notes (Signed)
Physical Therapy Session Note  Patient Details  Name: Jordan [name] MRN: [id number] Date of Birth: 1941/03/20  Today's Date: 04/14/2020 PT Individual Time: 9509-3267 PT Individual Time Calculation (min): 32 min   Short Term Goals: Week 1:  PT Short Term Goal 1 (Week 1): Pt will performed bed mobiltiy with min assist PT Short Term Goal 2 (Week 1): Pt will trasnfer to and from [facility] with min assist PT Short Term Goal 3 (Week 1): Pt will propell WC 126ft with min assist PT Short Term Goal 4 (Week 1): Pt will ambulate 34ft with min assist and LRAD  Skilled Therapeutic Interventions/Progress Updates:    Patient received sitting up in wc, agreeable to PT. He reports pain in R low back when asked, did not provide numerical rating. PT providing rest breaks, repositioning and distractions to assist with pain management. Patient propelling himself in wc 39ft with supervision and verbal cues for increased stroke efficiency with minimal carryover noted. Patient ambulated 40ft with RW and MinA. ModA needed when turning to sit in wc and verbal cues for increased step length R LE. Visual cue provided on walker to encourage patient to take longer step length, however, patient with minimal carryover of this. He was able to ambulate additional 7ft with RW and MinA. Patient notes significantly increased R side pain when lifting L LE onto wc footrest. Initially, patient stating he had R "hip" pain, however, upon palpation, patient locating pain at region of R flank-will notify MD of this. Patient transferring back to bed via stand pivot with RW and MinA. Doffing prosthetic and shoes with MinA and returning supine with supervision. Bed alarm on, call light within reach.   Therapy Documentation Precautions:  Precautions Precautions: Fall Precaution Comments: L prosthetic Required Braces or Orthoses: Other Brace Other Brace: L prosthetic Restrictions Weight Bearing Restrictions: No    Therapy/Group: Individual  Therapy  Elizebeth Koller, PT, DPT, CBIS  04/14/2020, 7:42 AM

## 2020-04-15 ENCOUNTER — Inpatient Hospital Stay (HOSPITAL_COMMUNITY): Payer: No Typology Code available for payment source

## 2020-04-15 ENCOUNTER — Inpatient Hospital Stay (HOSPITAL_COMMUNITY): Payer: No Typology Code available for payment source | Admitting: Physical Therapy

## 2020-04-15 LAB — GLUCOSE, CAPILLARY
Glucose-Capillary: 100 mg/dL — ABNORMAL HIGH (ref 70–99)
Glucose-Capillary: 254 mg/dL — ABNORMAL HIGH (ref 70–99)
Glucose-Capillary: 158 mg/dL — ABNORMAL HIGH (ref 70–99)

## 2020-04-15 MED ORDER — INSULIN GLARGINE 100 UNIT/ML ~~LOC~~ SOLN
28.0000 [IU] | Freq: Two times a day (BID) | SUBCUTANEOUS | Status: DC
  Administered 2020-04-15 – 2020-04-16 (×2): 28 [IU] via SUBCUTANEOUS
  Filled 2020-04-15 (×4): qty 0.28

## 2020-04-15 NOTE — Progress Notes (Signed)
Physical Therapy Weekly Progress Note  Patient Details  Name: Jordan [name] MRN: [id number] Date of Birth: 11-30-1940  Beginning of progress report period: April 08, 2020 End of progress report period: April 15, 2020  Today's Date: 04/15/2020 PT Individual Time: 0810-0907 PT Individual Time Calculation (min): 57 min   Patient has met 0 of 4 short term goals. Despite not meeting any short term goals [name] is making slow, steady progress with therapy to increase independence with functional mobility level. He demonstrates fatigue resulting in fluctuating levels of assistance to complete mobility tasks. He is performing supine<>sit with min/mod assist, sit<>stands and stand pivot transfers using RW with min/mod assist for lifting to stand and balance while turning, ambulating up to 19f using RW with min/mod assist due to R lateral lean and shuffled gait mechanics. He continues to demonstrates decreased speed of processing with impaired general awareness, poor midline orientation, and impaired activity tolerance. His mobility has also been impacted by recent onset R hip pain. He will benefit from continued CIR to further progress functional mobility level.  Patient continues to demonstrate the following deficits muscle weakness and muscle joint tightness, decreased cardiorespiratoy endurance, impaired timing and sequencing, abnormal tone, unbalanced muscle activation, motor apraxia, decreased coordination and decreased motor planning, decreased midline orientation, decreased initiation, decreased attention, decreased awareness, decreased problem solving, decreased safety awareness, decreased memory and delayed processing and decreased sitting balance, decreased standing balance, decreased postural control, hemiplegia and decreased balance strategies and therefore will continue to benefit from skilled PT intervention to increase functional independence with mobility.  Patient progressing toward  long term goals..  Continue plan of care.  PT Short Term Goals Week 1:  PT Short Term Goal 1 (Week 1): Pt will performed bed mobiltiy with min assist PT Short Term Goal 1 - Progress (Week 1): Progressing toward goal PT Short Term Goal 2 (Week 1): [facility] PT Short Term Goal 2 - Progress (Week 1): Progressing toward goal (occasional mod assist still needed) PT Short Term Goal 3 (Week 1): Pt will propell WC 1526fwith min assist PT Short Term Goal 3 - Progress (Week 1): Progressing toward goal PT Short Term Goal 4 (Week 1): Pt will ambulate 5070fith min assist and LRAD PT Short Term Goal 4 - Progress (Week 1): Progressing toward goal (varying from min to mod assist) Week 2:  PT Short Term Goal 1 (Week 2): Pt will perform supine<>sit with min assist PT Short Term Goal 2 (Week 2): Pt will perform sit<>stands using LRAD with min assist PT Short Term Goal 3 (Week 2): Pt will perform bed<>chair transfers using LRAD with min assist PT Short Term Goal 4 (Week 2): Pt will ambulate at least 72f43fing LRAD with min assist  Skilled Therapeutic Interventions/Progress Updates:  Ambulation/gait training;Balance/vestibular training;Cognitive remediation/compensation;Community reintegration;Discharge planning;DME/adaptive equipment instruction;Disease management/prevention;Neuromuscular re-education;Functional mobility training;Patient/family education;Pain management;Skin care/wound management;Psychosocial support;Splinting/orthotics;Stair training;Therapeutic Activities;Therapeutic Exercise;UE/LE Strength taining/ROM;Wheelchair propulsion/positioning;Visual/perceptual remediation/compensation;UE/LE Coordination activities   Pt received asleep, supine in bed and upon awakening agreeable to therapy session. Supine>sitting R EOB, HOB flat but using bedrails, with mod assist for trunk upright and then continued min assist to prevent posterior trunk LOB while pt scooted hips  towrads EOB. Sitting EOB with supervision for trunk control threaded pants with min assist, donned L LE prosthetic with set-up assist and min cuing for sleeve alignment, and R shoe with max assist for time management - pt requires significantly increased time to complete these  tasks with very slow speed of movement and slow processing this AM. Sit>stand EOB>RW with light mod assist for lifting to stand - standing with min/mod assist for balance due to R lean while pt pulled pants up over hips without assist. Short distance ambultory transfer ~33f EOB>w/c using RW with min assist for balance - requires max cuing for safe AD management during transfer as pt attempts to put AD off to the side.  Transported to/from gym in w/c for time management and energy conservation. Sit>stands w/c>RW with min assist and cuing to push up with B UEs on w/c armrests as opposed to 1 hand on RW and 1 hand on w/c as pt able to perform transfer with decreased assistance. Gait training ~268fusing RW with min assist for balance with pt demoing very minimal R/L weight shifting onto stance limb and very shuffled gait with decreased B LE step length and foot clearance with pt reporting increased R hip pain. Provided seated rest break with pt defining R hip pain as written below. Gait training 3651fsing RW with min assist for balance - continues to demo very slow, shuffled gait pattern with decreased B LE step length and foot clearance with repeated cuing for improved weight shift, upright posture, and increased R LE step length. Standing R LE NMR and balance activity via repeated R LE foot taps on/off 6" step with B UE support on RW progressed to only L UE support with min assist for balance due to R lean - mirror feedback and repeated cuing for improved upright posture and maintaining L weight shift. Gait training ~73f61fing RW with min assist for balance - demos increased B LE step lengths, increased B LE foot clearance achieving  reciprocal stepping pattern and working towards heel strike on initial contact with increased gait speed. Pt reports fatigue after this. Transported back to room in w/c. Sit>stand w/c>sink with min assist for lifting and standing with CGA washed face with set-up assistance - pt relying on intermittent L UE support on sink for balance. Pt left seated in w/c with needs in reach and seat belt alarm on.  Therapy Documentation Precautions:  Precautions Precautions: Fall Precaution Comments: L prosthetic Required Braces or Orthoses: Other Brace Other Brace: L prosthetic Restrictions Weight Bearing Restrictions: No  Pain:  Reports R hip pain that pt describes as a "grabbing" pain and upon palpation located along iliac crest/iliac wing near hip abductors with pt reporting it occurs in sitting when he lifts his L leg; however, states he believes ambulating decreases the pain. Pt reports he has not had this pain prior and he associates it with lying in the bed so much but pt with difficulty describing the pain, what provokes it, and its intensity.   Therapy/Group: Individual Therapy  CarlTawana ScaleT, DPT, CSRS  04/15/2020, 7:44 AM

## 2020-04-15 NOTE — Progress Notes (Signed)
[place] PHYSICAL MEDICINE & REHABILITATION PROGRESS NOTE   Subjective/Complaints: No complaints   ROS: Patient denies CP, SOB, N/V/D  Objective:   DG Swallowing Func-Speech Pathology  Result Date: 04/14/2020 Objective Swallowing Evaluation: Type of Study: MBS-Modified Barium Swallow Study  Patient Details Name: Jordan [name] MRN: [id number] Date of Birth: 03-28-1941 Today's Date: 04/14/2020 Past Medical History: Past Medical History: Diagnosis Date . Anemia  . Aortic stenosis  . CAD (coronary artery disease)   a. s/p IMI in past tx with POBA and cath 6 mos later with occluded RCA;  b. h/o Taxus DES to LAD and CFX;  c.  cath 1/10: LM ok, mLAD 40-50%, LAD stent ok, D2 tandem 60-70%, pCFX 30%, mAVCFX stent ok, pRCA 40%, RV 90%, RV marginal 90%, dRCA filled L-R collats tx medically . Carotid stenosis   dopplers 5/11: 40-59% bilat . CHF (congestive heart failure) (HCC)  . Diabetic ulcer of left foot (HCC)  . DM2 (diabetes mellitus, type 2) (HCC)  . Heart murmur  . HLD (hyperlipidemia)  . HTN (hypertension)  . Myocardial infarction (HCC) 1995 and 2005  Heart Attack . Osteomyelitis of left foot (HCC)  . Staphylococcus aureus bacteremia with sepsis [facility])  Past Surgical History: Past Surgical History: Procedure Laterality Date . ABDOMINAL AORTAGRAM N/A 04/07/2013  Procedure: ABDOMINAL [name];  Surgeon: [name], MD;  Location: [facility] CATH LAB;  Service: Cardiovascular;  Laterality: N/A; . ABDOMINAL AORTAGRAM N/A 01/26/2014  Procedure: ABDOMINAL [name];  Surgeon: [name], MD;  Location: [facility] CATH LAB;  Service: Cardiovascular;  Laterality: N/A; . ABDOMINAL AORTAGRAM N/A 06/13/2014  Procedure: ABDOMINAL [name];  Surgeon: [name], MD;  Location: [facility] CATH LAB;  Service: Cardiovascular;  Laterality: N/A; . AMPUTATION Left 01/28/2014  Procedure: AMPUTATION DIGIT- LEFT 5TH TOE;  Surgeon: [name], MD;  Location: [facility] OR;  Service: Vascular;  Laterality: Left; . AMPUTATION Left  02/02/2014  Procedure: Left 4th Ray Amputation vs Transmetatarsal Amputation;  Surgeon: [name], MD;  Location: [facility] OR;  Service: Orthopedics;  Laterality: Left; . AMPUTATION Left 03/24/2014  Procedure: AMPUTATION BELOW KNEE;  Surgeon: [name], MD;  [facility] OR;  Service: Orthopedics;  Laterality: Left; . APPENDECTOMY   . CARDIAC CATHETERIZATION   . CORONARY ANGIOPLASTY WITH STENT PLACEMENT   . FEMORAL-TIBIAL BYPASS GRAFT Left 01/28/2014  Procedure:  FEMORAL-ANTERIOR TIBIAL ARTERY Bypass Graft utilizing composite gortex graft and vein graft;  Surgeon: [name], MD;  [facility] OR;  Service: Vascular;  Laterality: Left; . LOWER EXTREMITY ANGIOGRAM Left 01/26/2014  Procedure: LOWER EXTREMITY ANGIOGRAM;  Surgeon: [name], MD;  Location: [facility] CATH LAB;  Service: Cardiovascular;  Laterality: Left; . RIGHT/LEFT HEART CATH AND CORONARY ANGIOGRAPHY N/A 12/02/2017  Procedure: RIGHT/LEFT HEART CATH AND CORONARY ANGIOGRAPHY;  Surgeon: Sw[place], [name], MD;  Location: [facility] INVASIVE CV LAB;  Service: Cardiovascular;  Laterality: N/A; . TEE WITHOUT CARDIOVERSION N/A 03/28/2014  Procedure: TRANSESOPHAGEAL ECHOCARDIOGRAM (TEE);  Surgeon: Quintella Reichert, MD;  Location: [facility] ENDOSCOPY;  Service: Cardiovascular;  Laterality: N/A; HPI: Jordan [name] is a 80 y.o. 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 Sx included weakness w/ multiple falls, slurred speech, difficulty swallowing, L facial droop. MRI shows small left gangliothalamic infarct. Pt  admitted to [facility] 04/08/19.  Subjective: "He was coughing a lot during breakfast." Assessment / Plan / Recommendation [facility] IP CLINICAL  IMPRESSIONS 04/14/2020 Clinical Impression --Pt presents with mild oral and moderate pharyngeal dysphagia that is slightly improved from last MBS conducted 04/03/20. However, he still remains at moderate risk for aspiration. Thin barium was consistently penetrated to the level of the vocal folds (PAS score 5) and silently aspirated X2 (PAS score 8) throughout study today, primarily due to a delay in swallow initiation and incomplete laryngeal vestibule closure. Chin tuck compensatory maneuver was attempted with thin barium, however it was ineffective to provide additional airway closure, and actually resulted in larger volume of aspiration in comparison to when aspirated via neutral head position. Pt's volitional cough was weak, and only partially effective to clear penetrates and aspirates. Pt also exhibited decreased cohesion of boluses, as well as intermittent premature spillage into pharynx. Although, his oral control does appear improved since last MBS. Nectar thick barium also reached as low as the level of the pyriform sinuses prior to swallow initiation, however no aspiration or penetration into was observed. Recommend pt continue current diet with intermittent supervision - regular texture solids, nectar liquids, pills whole with nectar. ST will continue to provide skilled interventions to address oral and pharyngeal deficits and work toward diet advancement while inpatient.  SLP Visit Diagnosis Dysphagia, oropharyngeal phase (R13.12) Attention and concentration deficit following -- Frontal lobe and executive function deficit following -- Impact on safety and function Moderate aspiration risk   CHL IP TREATMENT RECOMMENDATION 04/03/2020 Treatment Recommendations Therapy as outlined in treatment plan below   Prognosis 04/03/2020 Prognosis for Safe Diet Advancement Good Barriers to Reach Goals -- Barriers/Prognosis Comment -- [facility] IP DIET RECOMMENDATION 04/14/2020 SLP Diet Recommendations Nectar thick liquid;Regular  solids Liquid Administration via Cup;Straw Medication Administration Whole meds with liquid Compensations Slow rate;Small sips/bites;Clear throat intermittently Postural Changes Remain semi-upright after after feeds/meals (Comment)   CHL IP OTHER RECOMMENDATIONS 04/14/2020 Recommended Consults -- Oral Care Recommendations Oral care BID Other Recommendations Order thickener from pharmacy   CHL IP FOLLOW UP RECOMMENDATIONS 04/07/2020 Follow up Recommendations Inpatient Rehab   CHL IP FREQUENCY AND DURATION 04/03/2020 Speech Therapy Frequency (ACUTE ONLY) min 2x/week Treatment Duration 1 week;2 weeks      CHL IP ORAL PHASE 04/14/2020 Oral Phase Impaired Oral - Pudding Teaspoon -- Oral - Pudding Cup -- Oral - Honey Teaspoon -- Oral - Honey Cup -- Oral - Nectar Teaspoon -- Oral - Nectar Cup Decreased bolus cohesion;Lingual/palatal residue;Lingual pumping Oral - Nectar Straw NT Oral - Thin Teaspoon -- Oral - Thin Cup Decreased bolus cohesion;Premature spillage;Lingual/palatal residue Oral - Thin Straw NT Oral - Puree NT Oral - Mech Soft -- Oral - Regular NT Oral - Multi-Consistency -- Oral - Pill NT Oral Phase - Comment --  CHL IP PHARYNGEAL PHASE 04/14/2020 Pharyngeal Phase Impaired Pharyngeal- Pudding Teaspoon -- Pharyngeal -- Pharyngeal- Pudding Cup -- Pharyngeal -- Pharyngeal- Honey Teaspoon -- Pharyngeal -- Pharyngeal- Honey Cup NT Pharyngeal -- Pharyngeal- Nectar Teaspoon -- Pharyngeal -- Pharyngeal- Nectar Cup Delayed swallow initiation-pyriform sinuses Pharyngeal Material does not enter airway Pharyngeal- Nectar Straw NT Pharyngeal -- Pharyngeal- Thin Teaspoon NT Pharyngeal -- Pharyngeal- Thin Cup Delayed swallow initiation-pyriform sinuses;Reduced airway/laryngeal closure;Penetration/Aspiration during swallow;Compensatory strategies attempted (with notebox) Pharyngeal Material enters airway, passes BELOW cords without attempt by patient to eject out (silent aspiration);Material enters airway, CONTACTS cords and not  ejected out Pharyngeal- Thin Straw NT Pharyngeal -- Pharyngeal- Puree NT Pharyngeal -- Pharyngeal- Mechanical Soft --  Pharyngeal -- Pharyngeal- Regular -- Pharyngeal -- Pharyngeal- Multi-consistency -- Pharyngeal -- Pharyngeal- Pill NT Pharyngeal -- Pharyngeal Comment --  CHL IP CERVICAL ESOPHAGEAL PHASE 04/14/2020 Cervical Esophageal Phase WFL Pudding Teaspoon -- Pudding Cup -- Honey Teaspoon -- Honey Cup -- Nectar Teaspoon -- Nectar Cup -- Nectar Straw -- Thin Teaspoon -- Thin Cup -- Thin Straw -- Puree -- Mechanical Soft -- Regular -- Multi-consistency -- Pill -- Cervical Esophageal Comment -- [name] 04/14/2020, 9:39 AM              No results for input(s): WBC, HGB, HCT, PLT in the last 72 hours. Recent Labs    04/14/20 0009  CREATININE 1.12    Intake/Output Summary (Last 24 hours) at 04/15/2020 1930 Last data filed at 04/15/2020 1803 Gross per 24 hour  Intake 600 ml  Output --  Net 600 ml        Physical Exam: Vital Signs Blood pressure (!) 177/70, pulse 72, temperature 97.7 F (36.5 C), temperature source Oral, resp. rate 18, height 5\' 9"  (1.753 m), weight 92.5 kg, SpO2 97 %. Gen: no distress, normal appearing HEENT: oral mucosa pink and moist, NCAT Cardio: Reg rate Chest: normal effort, normal rate of breathing Abd: soft, non-distended Ext: no edema Psych: pleasant, normal affect Skin: intact  Neuro: Pt is groggy from sleep but answers appropriatelyRight central 7 with dysarthria ongoing.  Sensory exam is normal. Reflexes are 2+ in all 4's. No tremors. RUE 4-/5 prox to distal with PD. RLE 4/5--motor exam unchanged today. 5/5 LUE and LLE.   Musculoskeletal: no pain with Right hip ROM or with AROM, no pain to palp over greater troch       Assessment/Plan: 1. Functional deficits which require 3+ hours per day of interdisciplinary therapy in a comprehensive inpatient rehab setting.  Physiatrist is providing close team supervision and 24 hour management of active medical  problems listed below.  Physiatrist and rehab team continue to assess barriers to discharge/monitor patient progress toward functional and medical goals  Care Tool:  Bathing    Body parts bathed by patient: Right arm,Right upper leg,Left arm,Left upper leg,Chest,Right lower leg,Abdomen,Front perineal area,Face,Buttocks     Body parts n/a: Left lower leg (LLE BKA)   Bathing assist Assist Level: Minimal Assistance - Patient > 75%     Upper Body Dressing/Undressing Upper body dressing   What is the patient wearing?: Pull over shirt    Upper body assist Assist Level: Minimal Assistance - Patient > 75%    Lower Body Dressing/Undressing Lower body dressing      What is the patient wearing?: Incontinence brief     Lower body assist Assist for lower body dressing: Total Assistance - Patient < 25%     Toileting Toileting    Toileting assist Assist for toileting: Moderate Assistance - Patient 50 - 74%     Transfers Chair/bed transfer  Transfers assist     Chair/bed transfer assist level: Minimal Assistance - Patient > 75% Chair/bed transfer assistive device:   Ambulation assist      Assist level: Minimal Assistance - Patient > 75% Assistive device: Walker-rolling Max distance: 33ft   Walk 10 feet activity   Assist     Assist level: Minimal Assistance - Patient > 75% Assistive device: Walker-rolling   Walk 50 feet activity   Assist Walk 50 feet with 2 turns activity did not occur: Safety/medical concerns  Assist level: Minimal Assistance - Patient > 75% Assistive  device: Walker-rolling    Walk 150 feet activity   Assist Walk 150 feet activity did not occur: Safety/medical concerns         Walk 10 feet on uneven surface  activity   Assist Walk 10 feet on uneven surfaces activity did not occur: Safety/medical concerns         Wheelchair     Assist Will patient use wheelchair at discharge?:  Yes Type of Wheelchair: Manual    Wheelchair assist level: Supervision/Verbal cueing Max wheelchair distance: 50    Wheelchair 50 feet with 2 turns activity    Assist        Assist Level: Supervision/Verbal cueing   Wheelchair 150 feet activity     Assist      Assist Level: Moderate Assistance - Patient 50 - 74%   Blood pressure (!) 177/70, pulse 72, temperature 97.7 F (36.5 C), temperature source Oral, resp. rate 18, height 5\' 9"  (1.753 m), weight 92.5 kg, SpO2 97 %.  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 Antithrombotics: -DVT/anticoagulation:Lovenox -antiplatelet therapy: Aspirin 81 mg daily and Plavix 75 mg daily x3 weeks then Plavix alone 3. Pain Management:Tylenol as needed- ? R hip pain , cannot reproduce PT will monitor while pt in therapy  4. Mood:Resume Lexapro 10 mg daily -antipsychotic agents: N/A 5. Neuropsych: This patientiscapable of making decisions on hisown behalf. 6. Skin/Wound Care:Routine skin checks 7. Fluids/Electrolytes/Nutrition:Routine in and outs with follow-up chemistries 8. Dysphagia. Carb modified nectar thick liquid.   -advance per speech therapy 9. Diabetes mellitus with obesity,peripheral neuropathy. Hemoglobin A1c 8.5.   -Home regimen:NovoLog 12units 3 times daily, Lantus insulin 40units twice daily.  -1/9 sugars remain elevated     CBG (last 3)  Recent Labs    04/14/20 2135 04/15/20 0602 04/15/20 1626  GLUCAP 166* 100* 254*  increase lantus to 25U BID 1/12- in hospital CBG goal 110-180, will increase to 27 units on 1/14  1/15: CBG yesterday evening 254, low of 100, increase to 28U  10. AKI on CKD stage  III. Baseline creatinine 1.1-1.4. Cr normal at 1.12 on 1.14, repeat weekly 11. Permissive hypertension. Presently on Toprol-XL 25 mg daily. Patient on Norvasc 10 mg nightly, Cozaar 100 mg daily, Imdur 30 mg nightly prior to admission.   Vitals:   04/15/20 1359 04/15/20 1924  BP: (!) 110/57 (!) 177/70  Pulse: 73 72  Resp: 16 18  Temp: 97.8 F (36.6 C) 97.7 F (36.5 C)  SpO2: 97% 97%  Systolic elevation 1/14 Resume amlodipine 5mg   1/15: BP labile: continue amlodipine 5mg .   12. Hyperlipidemia. Zetia/Crestor 13. CAD with stenting. Continue aspirin and Plavix 14. Left BKA 2015. Patient does have a prosthesis which appears to be in working order but is due for replacement soon per pt([employer])    LOS: 8 days A FACE TO FACE EVALUATION WAS PERFORMED  Clint BolderKrutika P Taylor Levick 04/15/2020, 7:30 PM

## 2020-04-15 NOTE — Progress Notes (Signed)
Occupational Therapy Session Note  Patient Details  Name: Jordan [name] MRN: [id number] Date of Birth: 08-13-1940  Today's Date: 04/15/2020 OT Individual Time: 1430-1515 OT Individual Time Calculation (min): 45 min    Short Term Goals: Week 1:  OT Short Term Goal 1 (Week 1): Pt will complete LB dressing with min assist using AE as needed. OT Short Term Goal 1 - Progress (Week 1): Met OT Short Term Goal 2 (Week 1): Pt will complete UB/LB bathing with min assist using shower bench and AE as needed. OT Short Term Goal 2 - Progress (Week 1): Met OT Short Term Goal 3 (Week 1): Pt will complete toileting with min assist. OT Short Term Goal 3 - Progress (Week 1): Not met OT Short Term Goal 4 (Week 1): Pt will complete toilet transfer with CGA. OT Short Term Goal 4 - Progress (Week 1): Not met  Skilled Therapeutic Interventions/Progress Updates:    1:1. Pt received in w/c with R hip feeling "ok" in regards to pain. Rn delivered insulin at beginning of session. Pt transported total A to bioness BITS focusing on anterior weight shifting, dynamic reach, sequencing, memory and cognition. Pt completes # sequence, rotating # sequence, serial word recall up to 5 correct in order, and trail making B teds. Pt requires 7 min 25 second for trail making B with 1 instance of total cuing after Error at 5-E. After OT faded to question cues for alternating letter/number for the following 4 in sequence and then pt able to complete pattern without cues but significant time for processing. Pt completes SPT back to bed with bed rail and VC for weight shift L for R stepping. Exited session with pt seated in bed, exit alarm on and call light in reach   Therapy Documentation Precautions:  Precautions Precautions: Fall Precaution Comments: L prosthetic Required Braces or Orthoses: Other Brace Other Brace: L prosthetic Restrictions Weight Bearing Restrictions: No General:   Vital Signs:   Pain:    ADL: ADL Grooming: Supervision/safety Where Assessed-Grooming: Sitting at sink Upper Body Bathing: Minimal assistance Where Assessed-Upper Body Bathing: Edge of bed Lower Body Bathing: Moderate assistance Where Assessed-Lower Body Bathing: Edge of bed Upper Body Dressing: Setup Where Assessed-Upper Body Dressing: Edge of bed Lower Body Dressing: Moderate assistance Where Assessed-Lower Body Dressing: Edge of bed (sit/stand level using RW) Vision   Perception    Praxis   Exercises:   Other Treatments:     Therapy/Group: Individual Therapy  Tonny Branch 04/15/2020, 12:56 PM

## 2020-04-15 NOTE — Plan of Care (Signed)
  Problem: Consults Goal: RH STROKE PATIENT EDUCATION Description: See Patient Education module for education specifics  Outcome: Progressing Goal: Diabetes Guidelines if Diabetic/Glucose > 140 Description: If diabetic or lab glucose is > 140 mg/dl - Initiate Diabetes/Hyperglycemia Guidelines & Document Interventions  Outcome: Progressing   Problem: RH BOWEL ELIMINATION Goal: RH STG MANAGE BOWEL WITH ASSISTANCE Description: STG Manage Bowel with mod I Assistance. Outcome: Progressing   Problem: RH BLADDER ELIMINATION Goal: RH STG MANAGE BLADDER WITH ASSISTANCE Description: STG Manage Bladder With mod I Assistance Outcome: Progressing   Problem: RH KNOWLEDGE DEFICIT Goal: RH STG INCREASE KNOWLEDGE OF DIABETES Description: Pt will be able to demonstrate understanding of medication regimen, dietary and lifestyle modification to better control blood glucose levels with mod I assist using handouts and booklets provided.  Outcome: Progressing Goal: RH STG INCREASE KNOWLEDGE OF HYPERTENSION Description: Pt will be able to demonstrate understanding of medication regimen, dietary and lifestyle modification to better control blood pressure with mod I assist using handouts and booklets provided.  Outcome: Progressing Goal: RH STG INCREASE KNOWLEGDE OF HYPERLIPIDEMIA Description: Pt will be able to demonstrate understanding of medication regimen, dietary and lifestyle modification to better control blood cholesterol levels with mod I assist using handouts and booklets provided.  Outcome: Progressing Goal: RH STG INCREASE KNOWLEDGE OF STROKE PROPHYLAXIS Description: Pt will be able to demonstrate understanding of medication regimen, dietary and lifestyle modification to prevent stroke with mod I assist using handouts and booklets provided.  Outcome: Progressing   

## 2020-04-16 LAB — GLUCOSE, CAPILLARY
Glucose-Capillary: 113 mg/dL — ABNORMAL HIGH (ref 70–99)
Glucose-Capillary: 211 mg/dL — ABNORMAL HIGH (ref 70–99)
Glucose-Capillary: 214 mg/dL — ABNORMAL HIGH (ref 70–99)
Glucose-Capillary: 183 mg/dL — ABNORMAL HIGH (ref 70–99)
Glucose-Capillary: 166 mg/dL — ABNORMAL HIGH (ref 70–99)

## 2020-04-16 MED ORDER — INSULIN GLARGINE 100 UNIT/ML ~~LOC~~ SOLN
29.0000 [IU] | Freq: Two times a day (BID) | SUBCUTANEOUS | Status: DC
  Administered 2020-04-16 – 2020-04-18 (×4): 29 [IU] via SUBCUTANEOUS
  Filled 2020-04-16 (×5): qty 0.29

## 2020-04-16 NOTE — Progress Notes (Signed)
[facility] PHYSICAL MEDICINE & REHABILITATION PROGRESS NOTE   Subjective/Complaints: Sleepy, no complaints Chart reviewed, no issues overnight BP labile CBGS uncontrolled   ROS: Patient denies CP, SOB, N/V/D  Objective:   No results found. No results for input(s): WBC, HGB, HCT, PLT in the last 72 hours. Recent Labs    04/14/20 0009  CREATININE 1.12    Intake/Output Summary (Last 24 hours) at 04/16/2020 1315 Last data filed at 04/16/2020 0838 Gross per 24 hour  Intake 480 ml  Output --  Net 480 ml        Physical Exam: Vital Signs Blood pressure (!) 110/56, pulse 76, temperature 98.5 F (36.9 C), temperature source Oral, resp. rate 16, height 5\' 9"  (1.753 m), weight 93.8 kg, SpO2 95 %. Gen: no distress, normal appearing HEENT: oral mucosa pink and moist, NCAT Cardio: Reg rate Chest: normal effort, normal rate of breathing Abd: soft, non-distended Ext: no edema Psych: pleasant, normal affect Skin: intact  Neuro: Pt is groggy from sleep but answers appropriatelyRight central 7 with dysarthria ongoing.  Sensory exam is normal. Reflexes are 2+ in all 4's. No tremors. RUE 4-/5 prox to distal with PD. RLE 4/5--motor exam unchanged today. 5/5 LUE and LLE.   Musculoskeletal: no pain with Right hip ROM or with AROM, no pain to palp over greater troch       Assessment/Plan: 1. Functional deficits which require 3+ hours per day of interdisciplinary therapy in a comprehensive inpatient rehab setting.  Physiatrist is providing close team supervision and 24 hour management of active medical problems listed below.  Physiatrist and rehab team continue to assess barriers to discharge/monitor patient progress toward functional and medical goals  Care Tool:  Bathing    Body parts bathed by patient: Right arm,Right upper leg,Left arm,Left upper leg,Chest,Right lower leg,Abdomen,Front perineal area,Face,Buttocks     Body parts n/a: Left lower leg (LLE BKA)   Bathing assist  Assist Level: Minimal Assistance - Patient > 75%     Upper Body Dressing/Undressing Upper body dressing   What is the patient wearing?: Pull over shirt    Upper body assist Assist Level: Minimal Assistance - Patient > 75%    Lower Body Dressing/Undressing Lower body dressing      What is the patient wearing?: Incontinence brief     Lower body assist Assist for lower body dressing: Total Assistance - Patient < 25%     Toileting Toileting    Toileting assist Assist for toileting: Moderate Assistance - Patient 50 - 74%     Transfers Chair/bed transfer  Transfers assist     Chair/bed transfer assist level: Minimal Assistance - Patient > 75% Chair/bed transfer assistive device:   Ambulation assist      Assist level: Minimal Assistance - Patient > 75% Assistive device: Walker-rolling Max distance: 65ft   Walk 10 feet activity   Assist     Assist level: Minimal Assistance - Patient > 75% Assistive device: Walker-rolling   Walk 50 feet activity   Assist Walk 50 feet with 2 turns activity did not occur: Safety/medical concerns  Assist level: Minimal Assistance - Patient > 75% Assistive device: Walker-rolling    Walk 150 feet activity   Assist Walk 150 feet activity did not occur: Safety/medical concerns         Walk 10 feet on uneven surface  activity   Assist Walk 10 feet on uneven surfaces activity did not occur: Safety/medical concerns  Wheelchair     Assist Will patient use wheelchair at discharge?: Yes Type of Wheelchair: Manual    Wheelchair assist level: Supervision/Verbal cueing Max wheelchair distance: 50    Wheelchair 50 feet with 2 turns activity    Assist        Assist Level: Supervision/Verbal cueing   Wheelchair 150 feet activity     Assist      Assist Level: Moderate Assistance - Patient 50 - 74%   Blood pressure (!) 110/56, pulse 76, temperature  98.5 F (36.9 C), temperature source Oral, resp. rate 16, height 5\' 9"  (1.753 m), weight 93.8 kg, SpO2 95 %.  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 Antithrombotics: -DVT/anticoagulation:Lovenox -antiplatelet therapy: Aspirin 81 mg daily and Plavix 75 mg daily x3 weeks then Plavix alone 3. Pain Management:Tylenol as needed- ? R hip pain , cannot reproduce PT will monitor while pt in therapy  4. Mood:Resume Lexapro 10 mg daily -antipsychotic agents: N/A 5. Neuropsych: This patientiscapable of making decisions on hisown behalf. 6. Skin/Wound Care:Routine skin checks 7. Fluids/Electrolytes/Nutrition:Routine in and outs with follow-up chemistries 8. Dysphagia. Carb modified nectar thick liquid.   -advance per speech therapy 9. Diabetes mellitus with obesity,peripheral neuropathy. Hemoglobin A1c 8.5.   -Home regimen:NovoLog 12units 3 times daily, Lantus insulin 40units twice daily.  -1/9 sugars remain elevated     CBG (last 3)  Recent Labs    04/15/20 2113 04/16/20 0601 04/16/20 1126  GLUCAP 158* 113* 211*  increase lantus to 25U BID 1/12- in hospital CBG goal 110-180, will increase to 27 units on 1/14  1/15: CBG yesterday evening 254, low of 100, increase to 28U  1/16: CBGs still ranging 100-254: Increase Lantus to 29U.  10. AKI on CKD stage III. Baseline creatinine 1.1-1.4. Cr normal at 1.12 on 1.14, repeat weekly 11. Permissive hypertension. Presently on Toprol-XL 25 mg daily. Patient on Norvasc 10 mg nightly, Cozaar 100 mg daily, Imdur 30 mg nightly prior to admission.   Vitals:   04/16/20 0804 04/16/20 1307  BP: (!) 161/71 (!) 110/56  Pulse: 74 76   Resp:  16  Temp:  98.5 F (36.9 C)  SpO2:  95%  Systolic elevation 1/14 Resume amlodipine 5mg   1/15-1/16: BP labile: continue amlodipine 5mg .   12. Hyperlipidemia. Continue Zetia/Crestor 13. CAD with stenting. Continue aspirin and Plavix 14. 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

## 2020-04-16 NOTE — Progress Notes (Signed)
Incontinent of urine, with timed toileting. Wore CPAP at HS, slept good. Right lateral heel with "old pressure wound" per patient, with peeling skin. Heel elevated off  bed with pillow. Right shin discolored. Jordan [name]

## 2020-04-16 NOTE — Plan of Care (Signed)
  Problem: Consults Goal: RH STROKE PATIENT EDUCATION Description: See Patient Education module for education specifics  Outcome: Progressing Goal: Diabetes Guidelines if Diabetic/Glucose > 140 Description: If diabetic or lab glucose is > 140 mg/dl - Initiate Diabetes/Hyperglycemia Guidelines & Document Interventions  Outcome: Progressing   Problem: RH BOWEL ELIMINATION Goal: RH STG MANAGE BOWEL WITH ASSISTANCE Description: STG Manage Bowel with mod I Assistance. Outcome: Progressing   Problem: RH BLADDER ELIMINATION Goal: RH STG MANAGE BLADDER WITH ASSISTANCE Description: STG Manage Bladder With mod I Assistance Outcome: Progressing   Problem: RH KNOWLEDGE DEFICIT Goal: RH STG INCREASE KNOWLEDGE OF DIABETES Description: Pt will be able to demonstrate understanding of medication regimen, dietary and lifestyle modification to better control blood glucose levels with mod I assist using handouts and booklets provided.  Outcome: Progressing Goal: RH STG INCREASE KNOWLEDGE OF HYPERTENSION Description: Pt will be able to demonstrate understanding of medication regimen, dietary and lifestyle modification to better control blood pressure with mod I assist using handouts and booklets provided.  Outcome: Progressing Goal: RH STG INCREASE KNOWLEGDE OF HYPERLIPIDEMIA Description: Pt will be able to demonstrate understanding of medication regimen, dietary and lifestyle modification to better control blood cholesterol levels with mod I assist using handouts and booklets provided.  Outcome: Progressing Goal: RH STG INCREASE KNOWLEDGE OF STROKE PROPHYLAXIS Description: Pt will be able to demonstrate understanding of medication regimen, dietary and lifestyle modification to prevent stroke with mod I assist using handouts and booklets provided.  Outcome: Progressing   

## 2020-04-17 ENCOUNTER — Inpatient Hospital Stay (HOSPITAL_COMMUNITY): Payer: No Typology Code available for payment source

## 2020-04-17 ENCOUNTER — Inpatient Hospital Stay (HOSPITAL_COMMUNITY): Payer: No Typology Code available for payment source | Admitting: Speech Pathology

## 2020-04-17 ENCOUNTER — Inpatient Hospital Stay (HOSPITAL_COMMUNITY): Payer: No Typology Code available for payment source | Admitting: Occupational Therapy

## 2020-04-17 LAB — GLUCOSE, CAPILLARY
Glucose-Capillary: 267 mg/dL — ABNORMAL HIGH (ref 70–99)
Glucose-Capillary: 162 mg/dL — ABNORMAL HIGH (ref 70–99)
Glucose-Capillary: 219 mg/dL — ABNORMAL HIGH (ref 70–99)
Glucose-Capillary: 226 mg/dL — ABNORMAL HIGH (ref 70–99)
Glucose-Capillary: 238 mg/dL — ABNORMAL HIGH (ref 70–99)

## 2020-04-17 MED ORDER — SORBITOL 70 % SOLN
30.0000 mL | Freq: Every day | Status: DC | PRN
  Administered 2020-04-17 – 2020-04-18 (×2): 30 mL via ORAL
  Filled 2020-04-17 (×2): qty 30

## 2020-04-17 MED ORDER — FLEET ENEMA 7-19 GM/118ML RE ENEM
1.0000 | ENEMA | Freq: Every day | RECTAL | Status: DC | PRN
  Administered 2020-04-18 – 2020-04-20 (×2): 1 via RECTAL
  Filled 2020-04-17 (×2): qty 1

## 2020-04-17 NOTE — Progress Notes (Signed)
Physical Therapy Session Note  Patient Details  Name: Jordan [name] MRN: [id number] Date of Birth: February 26, 1941  Today's Date: 04/17/2020 PT Individual Time: 9371-6967 PT Individual Time Calculation (min): 42 min   Short Term Goals: Week 2:  PT Short Term Goal 1 (Week 2): Pt will perform supine<>sit with min assist PT Short Term Goal 2 (Week 2): Pt will perform sit<>stands using LRAD with min assist PT Short Term Goal 3 (Week 2): Pt will perform bed<>chair transfers using LRAD with min assist PT Short Term Goal 4 (Week 2): Pt will ambulate at least 109ft using LRAD with min assist  Skilled Therapeutic Interventions/Progress Updates:   Patient received sitting up in bed, agreeable to PT. He denies pain at rest. With movement, he reports familiar R sided flank pain 5/10, premedicated. He required ModA to come sit edge of bed with persistent R lateral lean in sitting noted. He required ModA to don pants in sitting with MaxA to come to standing to complete task. LOB x2 in standing requiring TotalA from PT to recover since patient demonstrated no protective reflexes to assist with regaining balance. Patient transferring to wc via stand pivot with RW and ModA. PT propelling patient in wc to therapy gym for time management and energy conservation. Sit <> stand x3 with RW, ModA and mirror for visual feedback. With input from mirror, patient able to maintain closer to midline posture, but with fatigue, starts to drift R again. Patient remaining up in wc in room, seatbelt alarm on, call light within reach.    Therapy Documentation Precautions:  Precautions Precautions: Fall Precaution Comments: L prosthetic Required Braces or Orthoses: Other Brace Other Brace: L prosthetic Restrictions Weight Bearing Restrictions: No    Therapy/Group: Individual Therapy  Elizebeth Koller, PT, DPT, CBIS  04/17/2020, 7:42 AM

## 2020-04-17 NOTE — Progress Notes (Signed)
Speech Language Pathology Daily Session Note  Patient Details  Name: Jordan [name] MRN: [id number] Date of Birth: May 05, 1940  Today's Date: 04/17/2020 SLP Individual Time: 2703-5009 SLP Individual Time Calculation (min): 55 min  Short Term Goals: Week 2: SLP Short Term Goal 1 (Week 2): Pt will participate in pharyngeal exercises (Mendelsohn and Supraglottic swallow) to increse timing of swallow initation and consume trials of thin liquids with vital s/s remaining WFL and minimal overt s/s aspiration prior to repeat instrumental swallow assessment. SLP Short Term Goal 2 (Week 2): Pt will selectively attention to functional tasks for 30 minutes with Supervision A cues for redirection. SLP Short Term Goal 3 (Week 2): Pt will complete functional mildly complex to complex tasks with Supervision A verbal/visual cues for problem solving. SLP Short Term Goal 4 (Week 2): Pt will self-monitor and self-correct functional errors with supervision A verbal cues. SLP Short Term Goal 5 (Week 2): Pt will demonstrate recall of novel information with supervision A verbal cues. SLP Short Term Goal 6 (Week 2): Pt will use increased vocal intensity to increase his intelligibility at the sentence level with Supervision A verbal cues.  Skilled Therapeutic Interventions: Pt was seen for skilled ST targeting dysphagia and cognitive goals. Upon arrival, pt was working on eating his regular texture/nectar thick liquid lunch tray, therefore, skilled observation provided by SLP. He required extra time to consume his meal, suspect more due to delayed processing and body movements, and decreased attention moreso than mastication time. Overall Supervision A level verbal cues provided for oral clearance of POs, espeicaly timing oral clearance before his begins to talk. Min A verbal cues provided for redirection to tasks during session. During functional conversation, pt did recall talking with an staff member about a heart healthy  diet, including reduced sodium levels and lean meat (fish and chicken) with overall Supervision A level verbal cues for recall and use of external aids (folder with info that had been left for him about heart healthy diet). Pt also completed a novel semi-complex card task, with Min A verbal cues for recall, and Supervision A verbal cues for use of a problem solving strategy. Pt left sitting in wheelchair with alarm set and needs within reach. Continue per current plan of care.      Pain Pain Assessment Pain Scale: 0-10 Pain Score: 0-No pain  Therapy/Group: Individual Therapy  Jordan [name] 04/17/2020, 7:09 AM

## 2020-04-17 NOTE — Progress Notes (Signed)
[facility] PHYSICAL MEDICINE & REHABILITATION PROGRESS NOTE   Subjective/Complaints:  No c/os today , denies breathing or bowel issues    ROS: Patient denies CP, SOB, N/V/D  Objective:   No results found. No results for input(s): WBC, HGB, HCT, PLT in the last 72 hours. No results for input(s): NA, K, CL, CO2, GLUCOSE, BUN, CREATININE, CALCIUM in the last 72 hours.  Intake/Output Summary (Last 24 hours) at 04/17/2020 1020 Last data filed at 04/16/2020 2000 Gross per 24 hour  Intake 300 ml  Output --  Net 300 ml        Physical Exam: Vital Signs Blood pressure (!) 141/76, pulse 82, temperature 98.6 F (37 C), resp. rate 18, height 5\' 9"  (1.753 m), weight 92 kg, SpO2 97 %.  General: No acute distress Mood and affect are appropriate Heart: Regular rate and rhythm no rubs murmurs or extra sounds Lungs: Clear to auscultation, breathing unlabored, no rales or wheezes Abdomen: Positive bowel sounds, soft nontender to palpation, nondistended Extremities: No clubbing, cyanosis, or edema  Neuro: Pt is groggy from sleep but answers appropriatelyRight central 7 with dysarthria ongoing.  Sensory exam is normal. Reflexes are 2+ in all 4's. No tremors. RUE 4-/5 prox to distal with PD. RLE 4/5--motor exam unchanged today. 5/5 LUE and LLE.   Musculoskeletal: no pain with Right hip ROM or with AROM, no pain to palp over greater troch       Assessment/Plan: 1. Functional deficits which require 3+ hours per day of interdisciplinary therapy in a comprehensive inpatient rehab setting.  Physiatrist is providing close team supervision and 24 hour management of active medical problems listed below.  Physiatrist and rehab team continue to assess barriers to discharge/monitor patient progress toward functional and medical goals  Care Tool:  Bathing    Body parts bathed by patient: Right arm,Right upper leg,Left arm,Left upper leg,Chest,Right lower leg,Abdomen,Front perineal  area,Face,Buttocks     Body parts n/a: Left lower leg (LLE BKA)   Bathing assist Assist Level: Minimal Assistance - Patient > 75%     Upper Body Dressing/Undressing Upper body dressing   What is the patient wearing?: Pull over shirt    Upper body assist Assist Level: Minimal Assistance - Patient > 75%    Lower Body Dressing/Undressing Lower body dressing      What is the patient wearing?: Incontinence brief     Lower body assist Assist for lower body dressing: Total Assistance - Patient < 25%     Toileting Toileting    Toileting assist Assist for toileting: Moderate Assistance - Patient 50 - 74%     Transfers Chair/bed transfer  Transfers assist     Chair/bed transfer assist level: Minimal Assistance - Patient > 75% Chair/bed transfer assistive device:   Ambulation assist      Assist level: Minimal Assistance - Patient > 75% Assistive device: Walker-rolling Max distance: 40ft   Walk 10 feet activity   Assist     Assist level: Minimal Assistance - Patient > 75% Assistive device: Walker-rolling   Walk 50 feet activity   Assist Walk 50 feet with 2 turns activity did not occur: Safety/medical concerns  Assist level: Minimal Assistance - Patient > 75% Assistive device: Walker-rolling    Walk 150 feet activity   Assist Walk 150 feet activity did not occur: Safety/medical concerns         Walk 10 feet on uneven surface  activity   Assist Walk 10 feet on  uneven surfaces activity did not occur: Safety/medical concerns         Wheelchair     Assist Will patient use wheelchair at discharge?: Yes Type of Wheelchair: Manual    Wheelchair assist level: Supervision/Verbal cueing Max wheelchair distance: 50    Wheelchair 50 feet with 2 turns activity    Assist        Assist Level: Supervision/Verbal cueing   Wheelchair 150 feet activity     Assist      Assist Level:  Moderate Assistance - Patient 50 - 74%   Blood pressure (!) 141/76, pulse 82, temperature 98.6 F (37 C), resp. rate 18, height 5\' 9"  (1.753 m), weight 92 kg, SpO2 97 %.  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 Antithrombotics: -DVT/anticoagulation:Lovenox -antiplatelet therapy: Aspirin 81 mg daily and Plavix 75 mg daily x3 weeks then Plavix alone 3. Pain Management:Tylenol as needed- ? R hip pain , cannot reproduce PT will monitor while pt in therapy  4. Mood:Resume Lexapro 10 mg daily -antipsychotic agents: N/A 5. Neuropsych: This patientiscapable of making decisions on hisown behalf. 6. Skin/Wound Care:Routine skin checks 7. Fluids/Electrolytes/Nutrition:Routine in and outs with follow-up chemistries 8. Dysphagia. Carb modified nectar thick liquid.   -advance per speech therapy 9. Diabetes mellitus with obesity,peripheral neuropathy. Hemoglobin A1c 8.5.   -Home regimen:NovoLog 12units 3 times daily, Lantus insulin 40units twice daily.  -1/9 sugars remain elevated     CBG (last 3)  Recent Labs    04/16/20 2005 04/16/20 2056 04/17/20 0617  GLUCAP 183* 166* 162*  increase lantus to 25U BID 1/12- in hospital CBG goal 110-180, will increase to 27 units on 1/14  1/15: CBG yesterday evening 254, low of 100, increase to 28U  1/16: CBGs still ranging 100-254: Increase Lantus to 29U.  10. AKI on CKD stage III. Baseline creatinine 1.1-1.4. Cr normal at 1.12 on 1.14, repeat weekly 11. Permissive hypertension. Presently on Toprol-XL 25 mg daily. Patient on Norvasc 10 mg nightly, Cozaar 100 mg daily, Imdur 30 mg nightly prior to admission.   Vitals:    04/16/20 2003 04/17/20 0329  BP: (!) 174/88 (!) 141/76  Pulse: 78 82  Resp: 18   Temp: 98.6 F (37 C) 98.6 F (37 C)  SpO2: 95% 97%  Systolic elevation 1/14 Resume amlodipine 5mg   1/15-1/16: BP labile: continue amlodipine 5mg .   12. Hyperlipidemia. Continue Zetia/Crestor 13. CAD with stenting. Continue aspirin and Plavix 14. Left BKA 2015. Patient does have a prosthesis which appears to be in working order but is due for replacement soon per pt([employer])    LOS: 10 days A FACE TO FACE EVALUATION WAS PERFORMED  04/17/2020, 10:20 AM

## 2020-04-17 NOTE — Progress Notes (Signed)
Patient ID: Jordan [name], male   DOB: 1940-04-27, 80 y.o.   MRN: [id number] Met with the patient to review role of the nurse CM and collaboration with the SW to address concerns and facilitate preparation for discharge. Reviewed secondary risk factors including HF, DM with A1c of 8.5, HTN, HLD with Triglyceride of 374. DAPT for three weeks then plavix solo per MD. Patient given information on HF and the ZONE tool, DASH diet and alternatives to salt in addition to information on CMM diet and carbohydrate counting. Patient also given information on Vitamin B12 deficiency. Continue to follow along to discharge and address educational needs. No questions or concerns noted at present. Margarito Liner

## 2020-04-18 ENCOUNTER — Inpatient Hospital Stay (HOSPITAL_COMMUNITY): Payer: No Typology Code available for payment source | Admitting: Occupational Therapy

## 2020-04-18 ENCOUNTER — Inpatient Hospital Stay (HOSPITAL_COMMUNITY): Payer: Medicare PPO

## 2020-04-18 ENCOUNTER — Inpatient Hospital Stay (HOSPITAL_COMMUNITY): Payer: No Typology Code available for payment source

## 2020-04-18 LAB — GLUCOSE, CAPILLARY
Glucose-Capillary: 128 mg/dL — ABNORMAL HIGH (ref 70–99)
Glucose-Capillary: 231 mg/dL — ABNORMAL HIGH (ref 70–99)
Glucose-Capillary: 190 mg/dL — ABNORMAL HIGH (ref 70–99)
Glucose-Capillary: 148 mg/dL — ABNORMAL HIGH (ref 70–99)

## 2020-04-18 LAB — URINALYSIS, ROUTINE W REFLEX MICROSCOPIC
Bilirubin Urine: NEGATIVE
Glucose, UA: NEGATIVE mg/dL
Ketones, ur: NEGATIVE mg/dL
Nitrite: NEGATIVE
Protein, ur: 100 mg/dL — AB
RBC / HPF: >50 RBC/hpf — ABNORMAL HIGH (ref 0–5)
Specific Gravity, Urine: 1.021 (ref 1.005–1.030)
WBC, UA: >50 WBC/hpf — ABNORMAL HIGH (ref 0–5)
pH: 5 (ref 5.0–8.0)

## 2020-04-18 MED ORDER — INSULIN GLARGINE 100 UNIT/ML ~~LOC~~ SOLN
30.0000 [IU] | Freq: Two times a day (BID) | SUBCUTANEOUS | Status: DC
  Administered 2020-04-18 – 2020-04-25 (×14): 30 [IU] via SUBCUTANEOUS
  Filled 2020-04-18 (×18): qty 0.3

## 2020-04-18 MED ORDER — SENNOSIDES-DOCUSATE SODIUM 8.6-50 MG PO TABS
2.0000 | ORAL_TABLET | Freq: Three times a day (TID) | ORAL | Status: DC
  Administered 2020-04-18 – 2020-04-23 (×13): 2 via ORAL
  Filled 2020-04-18 (×10): qty 2

## 2020-04-18 MED ORDER — SODIUM CHLORIDE 0.9 % IV SOLN: INTRAVENOUS | Status: DC

## 2020-04-18 NOTE — Progress Notes (Signed)
Last documented BM 1/13. Pt given sorbitol by day shift nurse. Primary RN gave senna. RN offered fleet enema to pt. Pt refused. Pt is hoping to have a BM after taking sorbitol. Pt in no acute distress. RN will continue to monitor.

## 2020-04-18 NOTE — Progress Notes (Signed)
PHYSICAL MEDICINE & REHABILITATION PROGRESS NOTE   Subjective/Complaints:  Seen in gym, participating well, no c/os   ROS: Patient denies CP, SOB, N/V/D  Objective:   No results found. No results for input(s): WBC, HGB, HCT, PLT in the last 72 hours. No results for input(s): NA, K, CL, CO2, GLUCOSE, BUN, CREATININE, CALCIUM in the last 72 hours.  Intake/Output Summary (Last 24 hours) at 04/18/2020 1032 Last data filed at 04/18/2020 0900 Gross per 24 hour  Intake 540 ml  Output 600 ml  Net -60 ml        Physical Exam: Vital Signs Blood pressure (!) 152/78, pulse 79, temperature 97.6 F (36.4 C), resp. rate 18, height 5\' 9"  (1.753 m), weight 92.2 kg, SpO2 96 %.   General: No acute distress Mood and affect are appropriate Heart: Regular rate and rhythm no rubs murmurs or extra sounds Lungs: Clear to auscultation, breathing unlabored, no rales or wheezes Abdomen: Positive bowel sounds, soft nontender to palpation, nondistended Extremities: No clubbing, cyanosis, or edema Skin: No evidence of breakdown, no evidence of rash  Neuro: Pt is groggy from sleep but answers appropriatelyRight central 7 with dysarthria ongoing.  Sensory exam is normal. Reflexes are 2+ in all 4's. No tremors. RUE 4-/5 prox to distal with PD. RLE 4/5--motor exam unchanged today. 5/5 LUE and LLE.   Musculoskeletal: no pain with Right hip ROM or with AROM, no pain to palp over greater troch       Assessment/Plan: 1. Functional deficits which require 3+ hours per day of interdisciplinary therapy in a comprehensive inpatient rehab setting.  Physiatrist is providing close team supervision and 24 hour management of active medical problems listed below.  Physiatrist and rehab team continue to assess barriers to discharge/monitor patient progress toward functional and medical goals  Care Tool:  Bathing    Body parts bathed by patient: Right arm,Right upper leg,Left arm,Left upper  leg,Chest,Right lower leg,Abdomen,Front perineal area,Face,Buttocks     Body parts n/a: Left lower leg (LLE BKA)   Bathing assist Assist Level: Minimal Assistance - Patient > 75%     Upper Body Dressing/Undressing Upper body dressing   What is the patient wearing?: Pull over shirt    Upper body assist Assist Level: Minimal Assistance - Patient > 75%    Lower Body Dressing/Undressing Lower body dressing      What is the patient wearing?: Incontinence brief     Lower body assist Assist for lower body dressing: Total Assistance - Patient < 25%     Toileting Toileting    Toileting assist Assist for toileting: Moderate Assistance - Patient 50 - 74%     Transfers Chair/bed transfer  Transfers assist     Chair/bed transfer assist level: Minimal Assistance - Patient > 75% Chair/bed transfer assistive device:   Ambulation assist      Assist level: Minimal Assistance - Patient > 75% Assistive device: Walker-rolling Max distance: 39ft   Walk 10 feet activity   Assist     Assist level: Minimal Assistance - Patient > 75% Assistive device: Walker-rolling   Walk 50 feet activity   Assist Walk 50 feet with 2 turns activity did not occur: Safety/medical concerns  Assist level: Minimal Assistance - Patient > 75% Assistive device: Walker-rolling    Walk 150 feet activity   Assist Walk 150 feet activity did not occur: Safety/medical concerns         Walk 10 feet on uneven surface  activity   Assist Walk 10 feet on uneven surfaces activity did not occur: Safety/medical concerns         Wheelchair     Assist Will patient use wheelchair at discharge?: Yes Type of Wheelchair: Manual    Wheelchair assist level: Minimal Assistance - Patient > 75% Max wheelchair distance: 58'    Wheelchair 50 feet with 2 turns activity    Assist        Assist Level: Supervision/Verbal cueing   Wheelchair 150  feet activity     Assist      Assist Level: Moderate Assistance - Patient 50 - 74%   Blood pressure (!) 152/78, pulse 79, temperature 97.6 F (36.4 C), resp. rate 18, height 5\' 9"  (1.753 m), weight 92.2 kg, SpO2 96 %.  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 Antithrombotics: -DVT/anticoagulation:Lovenox -antiplatelet therapy: Aspirin 81 mg daily and Plavix 75 mg daily x3 weeks then Plavix alone 3. Pain Management:Tylenol as needed- ? R hip pain , cannot reproduce PT will monitor while pt in therapy  4. Mood:Resume Lexapro 10 mg daily -antipsychotic agents: N/A 5. Neuropsych: This patientiscapable of making decisions on hisown behalf. 6. Skin/Wound Care:Routine skin checks 7. Fluids/Electrolytes/Nutrition:Routine in and outs with follow-up chemistries 8. Dysphagia. Carb modified nectar thick liquid.   -advance per speech therapy 9. Diabetes mellitus with obesity,peripheral neuropathy. Hemoglobin A1c 8.5.   -Home regimen:NovoLog 12units 3 times daily, Lantus insulin 40units twice daily.  -1/9 sugars remain elevated     CBG (last 3)  Recent Labs    04/17/20 1700 04/17/20 2059 04/18/20 0542  GLUCAP 226* 238* 128*    1/18: CBGs still ranging 100-254: Increase Lantus to 30U.  10. AKI on CKD stage III. Baseline creatinine 1.1-1.4. Cr normal at 1.12 on 1.14, repeat weekly 11. Permissive hypertension. Presently on Toprol-XL 25 mg daily. Patient on Norvasc 10 mg nightly, Cozaar 100 mg daily, Imdur 30 mg nightly prior to admission.   Vitals:   04/17/20 1935 04/18/20 0359  BP: (!) 158/79 (!) 152/78  Pulse: 77 79  Resp:    Temp: 98.5 F  (36.9 C) 97.6 F (36.4 C)  SpO2: 95% 96%  Systolic elevation 1/14 Resume amlodipine 5mg  Increase to 10mg  12. Hyperlipidemia. Continue Zetia/Crestor 13. CAD with stenting. Continue aspirin and Plavix 14. Left BKA 2015. Patient does have a prosthesis which appears to be in working order but is due for replacement soon per pt([employer])    LOS: 11 days A FACE TO FACE EVALUATION WAS PERFORMED  2/14 04/18/2020, 10:32 AM

## 2020-04-18 NOTE — Progress Notes (Addendum)
Occupational Therapy Session Note  Patient Details  Name: Jordan [name] MRN: [id number] Date of Birth: 1941/01/22  Today's Date: 04/18/2020 OT Individual Time: 2229-7989 OT Individual Time Calculation (min): 53 min    Short Term Goals: Week 2:  OT Short Term Goal 1 (Week 2): Pt will complete toilet transfer with min guard A. OT Short Term Goal 2 (Week 2): Pt will complete toileting with min assist. OT Short Term Goal 3 (Week 2): Pt will complete standing grooming for ~2 min with min A.  Skilled Therapeutic Interventions/Progress Updates:    Session 1: (2119-4174) Pt completed bathing and dressing shower level during session.  He was able to ambulate from the wheelchair at bedside with mod assist using the RW for support.  Pt moves slower than normal with shorter step length on the right.  He was able to remove UB clothing with supervision but needed max assist for removal of LB clothing with supervision for prosthesis.  He completed all bathing in sitting with setup for UB bathing and min assist for LB with lateral leans for washing buttocks.  Therapist had to complete more thorough buttocks washing with pt leaning to the left.  Next, he dried off and donned his prosthesis with supervision and then therapist provided total assist for donning the right shoe.  He then transferred out to the sink for dressing from the wheelchair.  Mod assist for mobility with mod instructional cueing for squaring up to the surface before sitting down.  He completed UB dressing with setup and donned his brief and pants over his feet with increased time and min guard.  Mod assist for standing to pull the items over his hips.  He needed total assist for applying Eucerine cream to his LLE as well as his sock and his shoe.  He was left in the wheelchair in preparation for lunch.  Call button and phone in reach with safety belt in place.    Session 2: (0814-4818)  Pt worked on doffing and donning his right shoe and sock  during session.  He was able to complete doffing of the sock with max instructional cueing and mod assist using the reacher.  He was educated on use of the sockaide for donning his sock, with mod assist to apply to the sockaide.  Once on, he was able to complete donning it with supervision.  He was able to donn his velcro shoe with use of the reacher, increased time, and supervision with mod instructional cueing for technique.  Next, therapist took him down to the therapy gym where he worked on standing balance and RUE functional reach.  He was able to complete standing with min assist while having to reach down with the RUE to pick up playing cards from a container on the right side at knee level.  Increased LOB to the right noted while reaching requiring min assist to correct.  Mod demonstrational cueing needed to try and maintain upright hip and knee extension in standing.  He needed rest breaks after standing for approximately 3 mins.  Returned to the wheelchair with min assist stand pivot and then back to the room via therapist assist.  He needed min assist for transfer to the bed with use of the RW for support with supervision for removal of LLE prosthesis as well as for removal of right shoe.  Call button and phone in reach with safety alarm in place.     Therapy Documentation Precautions:  Precautions Precautions: Fall Precaution Comments:  L prosthetic Required Braces or Orthoses: Other Brace Other Brace: L prosthetic Restrictions Weight Bearing Restrictions: No  Pain: Pain Assessment Pain Scale: 0-10 Pain Score: 5  Pain Location: Back Pain Orientation: Lower Pain Intervention(s): Medication (See eMAR) ADL: See Care Tool Section for some details of mobility and selfcare  Therapy/Group: Individual Therapy  Jordanne Elsbury OTR/L 04/18/2020, 12:53 PM

## 2020-04-18 NOTE — Progress Notes (Signed)
UA results called to Delle Reining PA no further orders as are labs pending for morning.

## 2020-04-18 NOTE — Progress Notes (Signed)
Per nurse- patient has not had BM since 1/13. He had sorbitol yesterday and enema this evening without good results. Senna S increased to tid and KUB ordered to check stool burden. Also repots of not voiding for 8 hours today and required cath of 400 cc. Fluid intake has been poor. HS IVF added. Labs ordered for am. KUB with air in colon question question etiology. Due to enema? Will recheck CBC, BMET and KUB in am. May need to downgrade diet but on nectar liquids--will await am films.

## 2020-04-18 NOTE — Progress Notes (Signed)
Physical Therapy Session Note  Patient Details  Name: Jordan [name] MRN: [id number] Date of Birth: Aug 10, 1940  Today's Date: 04/18/2020 PT Individual Time: 0258-5277 PT Individual Time Calculation (min): 72 min   Short Term Goals: Week 2:  PT Short Term Goal 1 (Week 2): Pt will perform supine<>sit with min assist PT Short Term Goal 2 (Week 2): Pt will perform sit<>stands using LRAD with min assist PT Short Term Goal 3 (Week 2): Pt will perform bed<>chair transfers using LRAD with min assist PT Short Term Goal 4 (Week 2): Pt will ambulate at least 47ft using LRAD with min assist  Skilled Therapeutic Interventions/Progress Updates:    Patient in supine with RN finishing giving meds.  Performed supine to sit with min A.  Patient seated to don pants & prosthesis with min A for finishing threading R leg and for safety/balance with sit to stand to RW.  Patient performed stand pivot to w/c with min A.  Seated in w/c propelled to sink to wash his face with S/set up.  Propelled w/c with mod cues and occasional hand over hand to reach higher on wheel on R x 80'.  Patient assisted to gym in w/c.  Performed sit to stand with min A and ambulated to mat x 30' with RW and min A cues for foot clearance.  Performed standing balance activity without RW with min A to match cards on back of mirror pt using L hand for support on side of mirror at times and c/o back pain.  RN made aware pt needing medication.  Seated reaching for horseshoes at different heights for balance, L weight shift and flexibility.  Sit to supine on mat with mod A pt c/o back pain.  Performed passive stretches trunk rocking in hooklying, single knee to chest, hamstring stretch all x 20 sec hold.  Supine therex including hooklying hip abduction with green t-band 2 x 10, double knee to chest for lower abdominal activation x 10 w/ assist, L sidelying: R hip extension with A and R hip clamshell x 10.  Rolled to R with min A and side to sit with min to  mod A from flat mat with mod cues for technique.  Patient ambulated with RW 17' & 47' with min A occasional catching L foot with mod cues for foot clearance and walker proximity and posture.  Assisted in w/c to room and left up in w/c with belt alarm and needs in reach, NT aware to check if needs brief changed.   Therapy Documentation Precautions:  Precautions Precautions: Fall Precaution Comments: L prosthetic Required Braces or Orthoses: Other Brace Other Brace: L prosthetic Restrictions Weight Bearing Restrictions: No  Pain: Pain Assessment Pain Scale: 0-10 Pain Score: 5  Faces Pain Scale: Hurts even more Pain Type: Acute pain Pain Location: Back Pain Orientation: Lower Pain Descriptors / Indicators: Aching Pain Onset: With Activity Pain Intervention(s): Other (Comment);Repositioned;RN made aware (stretching)   Therapy/Group: Individual Therapy  Elray Mcgregor  Sheran Lawless, PT 04/18/2020, 4:10 PM

## 2020-04-18 NOTE — Progress Notes (Signed)
Fleet enema given to pt. Small results produced. Manually disimpacted small hard stool. Pt tolerated well. KUB ordered.  Mylo Red, LPN

## 2020-04-19 ENCOUNTER — Inpatient Hospital Stay (HOSPITAL_COMMUNITY): Payer: Medicare PPO

## 2020-04-19 ENCOUNTER — Inpatient Hospital Stay (HOSPITAL_COMMUNITY): Payer: No Typology Code available for payment source | Admitting: Physical Therapy

## 2020-04-19 ENCOUNTER — Inpatient Hospital Stay (HOSPITAL_COMMUNITY): Payer: No Typology Code available for payment source | Admitting: Occupational Therapy

## 2020-04-19 ENCOUNTER — Inpatient Hospital Stay (HOSPITAL_COMMUNITY): Payer: No Typology Code available for payment source | Admitting: Speech Pathology

## 2020-04-19 LAB — CBC
HCT: 40.2 % (ref 39.0–52.0)
Hemoglobin: 12.6 g/dL — ABNORMAL LOW (ref 13.0–17.0)
MCH: 27.5 pg (ref 26.0–34.0)
MCHC: 31.3 g/dL (ref 30.0–36.0)
MCV: 87.8 fL (ref 80.0–100.0)
Platelets: 446 10*3/uL — ABNORMAL HIGH (ref 150–400)
RBC: 4.58 MIL/uL (ref 4.22–5.81)
RDW: 13.9 % (ref 11.5–15.5)
WBC: 11.6 10*3/uL — ABNORMAL HIGH (ref 4.0–10.5)
nRBC: 0 % (ref 0.0–0.2)

## 2020-04-19 LAB — GLUCOSE, CAPILLARY
Glucose-Capillary: 94 mg/dL (ref 70–99)
Glucose-Capillary: 183 mg/dL — ABNORMAL HIGH (ref 70–99)
Glucose-Capillary: 210 mg/dL — ABNORMAL HIGH (ref 70–99)
Glucose-Capillary: 226 mg/dL — ABNORMAL HIGH (ref 70–99)

## 2020-04-19 LAB — BASIC METABOLIC PANEL
Anion gap: 10 (ref 5–15)
BUN: 15 mg/dL (ref 8–23)
CO2: 28 mmol/L (ref 22–32)
Calcium: 9.1 mg/dL (ref 8.9–10.3)
Chloride: 101 mmol/L (ref 98–111)
Creatinine, Ser: 1.29 mg/dL — ABNORMAL HIGH (ref 0.61–1.24)
GFR, Estimated: 56 mL/min — ABNORMAL LOW (ref >60)
Glucose, Bld: 101 mg/dL — ABNORMAL HIGH (ref 70–99)
Potassium: 3.8 mmol/L (ref 3.5–5.1)
Sodium: 139 mmol/L (ref 135–145)

## 2020-04-19 MED ORDER — CEPHALEXIN 250 MG PO CAPS
250.0000 mg | ORAL_CAPSULE | Freq: Three times a day (TID) | ORAL | Status: DC
  Administered 2020-04-19 – 2020-04-22 (×10): 250 mg via ORAL
  Filled 2020-04-19 (×10): qty 1

## 2020-04-19 NOTE — Progress Notes (Signed)
PHYSICAL MEDICINE & REHABILITATION PROGRESS NOTE   Subjective/Complaints:  Labs reviewed new leukocytosis Creat stable Reviewed KUB films large amt stool with Ba++ contrast   ROS: Patient denies CP, SOB, N/V/D  Objective:   DG Abd 1 View  Result Date: 04/19/2020 CLINICAL DATA:  Abdominal pain and distension EXAM: ABDOMEN - 1 VIEW COMPARISON:  04/18/2020 FINDINGS: Small amount retained contrast throughout colon from cecum to rectum. Nonobstructive bowel gas pattern. No bowel dilatation or bowel wall thickening. Reservoir in RIGHT pelvis. No acute osseous findings. IMPRESSION: Scattered mild retained contrast throughout colon without evidence of bowel obstruction or dilatation. Electronically Signed   By: Lavonia Dana M.D.   On: 04/19/2020 08:16   DG Abd 1 View  Result Date: 04/18/2020 CLINICAL DATA:  Constipation for 1 day, history of diabetes mellitus EXAM: ABDOMEN - 1 VIEW COMPARISON:  None. FINDINGS: High attenuation enteric contrast media has traversed to the level of the rectum. There is a nonspecific paucity of small bowel gas but with an air-filled appearance of the colon. Passage of contrast argues against an obstructive process. No suspicious abdominal calcifications. Degenerative changes seen in the spine, hips and pelvis. IMPRESSION: 1. Nonspecific paucity of small bowel gas but with an air-filled appearance of the colon. Nonspecific but can be a normal finding particularly given passage of enteric contrast to the rectum which favors a nonobstructive process. Electronically Signed   By: Lovena Le M.D.   On: 04/18/2020 20:09   Recent Labs    04/19/20 0647  WBC 11.6*  HGB 12.6*  HCT 40.2  PLT 446*   Recent Labs    04/19/20 0647  NA 139  K 3.8  CL 101  CO2 28  GLUCOSE 101*  BUN 15  CREATININE 1.29*  CALCIUM 9.1    Intake/Output Summary (Last 24 hours) at 04/19/2020 0858 Last data filed at 04/19/2020 0810 Gross per 24 hour  Intake 789.93 ml  Output 1500  ml  Net -710.07 ml        Physical Exam: Vital Signs Blood pressure (!) 143/73, pulse 79, temperature 98.2 F (36.8 C), temperature source Oral, resp. rate 18, height _0  (1.753 m), weight 91.9 kg, SpO2 97 %.   General: No acute distress Mood and affect are appropriate Heart: Regular rate and rhythm no rubs murmurs or extra sounds Lungs: Clear to auscultation, breathing unlabored, no rales or wheezes Abdomen: Positive bowel sounds, soft nontender to palpation, nondistended Extremities: No clubbing, cyanosis, or edema Skin: No evidence of breakdown, no evidence of rash  Neuro: Pt is groggy from sleep but answers appropriatelyRight central 7 with dysarthria ongoing.  Sensory exam is normal. Reflexes are 2+ in all 4's. No tremors. RUE 4-/5 prox to distal with PD. RLE 4/5--motor exam unchanged today. 5/5 LUE and LLE.   Musculoskeletal: no pain with Right hip ROM or with AROM, no pain to palp over greater troch       Assessment/Plan: 1. Functional deficits which require 3+ hours per day of interdisciplinary therapy in a comprehensive inpatient rehab setting.  Physiatrist is providing close team supervision and 24 hour management of active medical problems listed below.  Physiatrist and rehab team continue to assess barriers to discharge/monitor patient progress toward functional and medical goals  Care Tool:  Bathing    Body parts bathed by patient: Right arm,Right upper leg,Left arm,Left upper leg,Chest,Right lower leg,Abdomen,Front perineal area,Face   Body parts bathed by helper: Buttocks Body parts n/a: Left lower leg   Bathing assist  Assist Level: Minimal Assistance - Patient > 75%     Upper Body Dressing/Undressing Upper body dressing   What is the patient wearing?: Pull over shirt    Upper body assist Assist Level: Supervision/Verbal cueing    Lower Body Dressing/Undressing Lower body dressing      What is the patient wearing?: Incontinence brief,Pants      Lower body assist Assist for lower body dressing: Moderate Assistance - Patient 50 - 74%     Toileting Toileting    Toileting assist Assist for toileting: Moderate Assistance - Patient 50 - 74%     Transfers Chair/bed transfer  Transfers assist     Chair/bed transfer assist level: Minimal Assistance - Patient > 75% Chair/bed transfer assistive device: Therapist, sports   Ambulation assist      Assist level: Minimal Assistance - Patient > 75% Assistive device: Walker-rolling Max distance: 65   Walk 10 feet activity   Assist     Assist level: Minimal Assistance - Patient > 75% Assistive device: Walker-rolling   Walk 50 feet activity   Assist Walk 50 feet with 2 turns activity did not occur: Safety/medical concerns  Assist level: Minimal Assistance - Patient > 75% Assistive device: Walker-rolling    Walk 150 feet activity   Assist Walk 150 feet activity did not occur: Safety/medical concerns         Walk 10 feet on uneven surface  activity   Assist Walk 10 feet on uneven surfaces activity did not occur: Safety/medical concerns         Wheelchair     Assist Will patient use wheelchair at discharge?: Yes Type of Wheelchair: Manual    Wheelchair assist level: Supervision/Verbal cueing Max wheelchair distance: 30'    Wheelchair 50 feet with 2 turns activity    Assist        Assist Level: Supervision/Verbal cueing   Wheelchair 150 feet activity     Assist      Assist Level: Moderate Assistance - Patient 50 - 74%   Blood pressure (!) 143/73, pulse 79, temperature 98.2 F (36.8 C), temperature source Oral, resp. rate 18, height $RemoveBe'5\' 9"'wVvcwPkyM$  (1.753 m), weight 91.9 kg, SpO2 97 %.  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 Formulized individual treatment plan based on medical history, underlying problem and comorbidities.  2. Antithrombotics: -DVT/anticoagulation:Lovenox -antiplatelet therapy: Aspirin 81 mg daily and Plavix 75 mg daily x3 weeks then Plavix alone 3. Pain Management:Tylenol as needed- ? R hip pain , cannot reproduce PT will monitor while pt in therapy  4. Mood:Resume Lexapro 10 mg daily -antipsychotic agents: N/A 5. Neuropsych: This patientiscapable of making decisions on hisown behalf. 6. Skin/Wound Care:Routine skin checks 7. Fluids/Electrolytes/Nutrition:Routine in and outs with follow-up chemistries 8. Dysphagia. Carb modified nectar thick liquid.   -advance per speech therapy 9. Diabetes mellitus with obesity,peripheral neuropathy. Hemoglobin A1c 8.5.   -Home regimen:NovoLog 12units 3 times daily, Lantus insulin 40units twice daily.  -1/9 sugars remain elevated     CBG (last 3)  Recent Labs    04/18/20 1644 04/18/20 2113 04/19/20 0628  GLUCAP 190* 148* 94   Improve after increase of Lantus to 30U.  10. AKI on CKD stage III. Baseline creatinine 1.1-1.4. Cr  normal at 1.12 on 1.14, repeat weekly 11. Permissive hypertension. Presently on Toprol-XL 25 mg daily. Patient on Norvasc 10 mg nightly, Cozaar 100 mg daily, Imdur 30 mg nightly prior to admission.   Vitals:   04/18/20 2015 04/19/20 0300  BP: (!) 147/72 (!) 143/73  Pulse: 76 79  Resp:  18  Temp: 97.8 F (36.6 C) 98.2 F (36.8 C)  SpO2: 95% 97%   cont. amlodipine 32m 12. Hyperlipidemia. Continue Zetia/Crestor 13. CAD with stenting. Continue aspirin and Plavix 14. 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   Constipation senna S 2 po TID, add miralax todaym fleets today if no BM  LOS: 12 days A FACE TO FACE EVALUATION WAS PERFORMED  ACharlett Blake1/19/2022, 8:58 AM

## 2020-04-19 NOTE — Progress Notes (Signed)
Speech Language Pathology Daily Session Note  Patient Details  Name: Jordan [name] MRN: [id number] Date of Birth: [birth year]  Today's Date: 04/19/2020 SLP Individual Time: 0092-3300 SLP Individual Time Calculation (min): 56 min  Short Term Goals: Week 2: SLP Short Term Goal 1 (Week 2): Pt will participate in pharyngeal exercises (Mendelsohn and Supraglottic swallow) to increse timing of swallow initation and consume trials of thin liquids with vital s/s remaining WFL and minimal overt s/s aspiration prior to repeat instrumental swallow assessment. SLP Short Term Goal 2 (Week 2): Pt will selectively attention to functional tasks for 30 minutes with Supervision A cues for redirection. SLP Short Term Goal 3 (Week 2): Pt will complete functional mildly complex to complex tasks with Supervision A verbal/visual cues for problem solving. SLP Short Term Goal 4 (Week 2): Pt will self-monitor and self-correct functional errors with supervision A verbal cues. SLP Short Term Goal 5 (Week 2): Pt will demonstrate recall of novel information with supervision A verbal cues. SLP Short Term Goal 6 (Week 2): Pt will use increased vocal intensity to increase his intelligibility at the sentence level with Supervision A verbal cues.  Skilled Therapeutic Interventions: Pt was seen for skilled ST targeting dysphagia and cognitive goals. SLP facilitated session by teaching pt Masako and Effortful swallowing exercises for pharyngeal strengthening and timing of swallow initiation. He was only able to successfully return demonstration of Effortful swallow exercise and X1 with Max A verbal cueing. Recommended that pt focus on Effortful and practice throughout day, with goal to perform at least 10 today, working toward 3 sets of 10 per day. When MD arrived he reported decrease in fluid in take requiring IV fluids - SLP provided external memory and tracking aid for pt to cross of cups of fluids as he drinks them throughout day.  drinking log set up for remainder of his stay and let in plain site to remind pt to use it to track intake. During a basic logic based calendar task, pt required Min A verbal and visual cueing to detect logical errors. He also recalled how a familiar semi-complex card task with Min A verbal and visual cueing in order to "teach" therapist how to play. Min quickly faded to Supervision A verbal cues were required for problem solving during card task. Pt left laying in bed with alarm set and needs within reach. Continue per current plan of care.      Pain Pain Assessment Pain Scale: 0-10 Pain Score: 0-No pain  Therapy/Group: Individual Therapy  [name] 04/19/2020, 7:25 AM

## 2020-04-19 NOTE — Progress Notes (Signed)
Patient ID: Jordan [name], male   DOB: 07-11-1940, 80 y.o.   MRN: [id number] Team Conference Report to Patient/Family  Team Conference discussion was reviewed with the patient and caregiver, including goals, any changes in plan of care and target discharge date.  Patient and caregiver express understanding and are in agreement.  The patient has a target discharge date of 04/26/20.  Andria Rhein 04/19/2020, 11:28 AM

## 2020-04-19 NOTE — Progress Notes (Signed)
KUB  And UA results called to Delle Reining PA, RN instructed to not give enema. AM labs pending. Pt denies gas pain. Bowel sounds hypoactive. RN will continue to monitor.

## 2020-04-19 NOTE — Progress Notes (Signed)
Physical Therapy Session Note  Patient Details  Name: Jordan [name] MRN: [id number] Date of Birth: 1940/06/24  Today's Date: 04/19/2020 PT Individual Time: 1117-1212 PT Individual Time Calculation (min): 55 min   Short Term Goals: Week 2:  PT Short Term Goal 1 (Week 2): Pt will perform supine<>sit with min assist PT Short Term Goal 2 (Week 2): Pt will perform sit<>stands using LRAD with min assist PT Short Term Goal 3 (Week 2): Pt will perform bed<>chair transfers using LRAD with min assist PT Short Term Goal 4 (Week 2): Pt will ambulate at least 74ft using LRAD with min assist  Skilled Therapeutic Interventions/Progress Updates:    Pt received asleep, supine in bed and upon awakening agreeable to therapy session. Pt remains lethargic throughout session frequently falling asleep with decreased alertness. Supine>sitting R EOB, HOB flat but using bedrail, with max assist for trunk upright and min/mod assist for sitting balance while scooting to EOB. Therapist and pt discussed D/C plan and decided to call pt's wife to be included in the conversation. Pt remains sitting EOB during this conversation requiring close supervision and frequent tactile cuing to remain awake as he starts falling asleep and leaning backwards. Pt's wife reports that through the [place] [employer] program pt was provided an OT evaluation back in March 2021 who recommended pt needed a power wheelchair at that time; however, pt's wife reports that due to the power w/c parts being on backorder they have not yet received the chair (wife states the wheelchair is being ordered through [employer] and [employer]). Pt's wife also reports this program is how pt was able to have their ramp entrance built onto their house. 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 Pt reports he still wants the power wheelchair as opposed to a custom manual wheelchair; however, pt's wife in agreement to the wheelchair that will be safest for patient. Therapist discussed the option of trialing a power w/c during CIR and pt in agreement. Sitting EOB donned shorts max assist and L LE prosthetic mod assist as pt remains lethargic. Sit>stand elevated EOB>RW with mod assist for lifting/balance due to R lean while pulling pants up over hips without assist. L stand pivot to w/c using RW with min assist for balance and max cuing for sequencing LE stepping and AD management. Gait training ~13ft using RW with min progressing towards mod assist for balance and AD management towards end - demos very slow shuffled gait with decreased R/L weight shift and significantly decreased step lengths with lack of foot clearance and increased forward trunk flexion causing him to push AD too far forward - max cuing and manual facilitation for improvements throughout. Therapist updated Trula Ore, SW on the above w/c information. At end of session, pt left seated in w/c with needs in reach and seat belt alarm on.  Therapy Documentation Precautions:  Precautions Precautions: Fall Precaution Comments: L prosthetic Required Braces or Orthoses: Other Brace Other Brace: L prosthetic Restrictions Weight Bearing Restrictions: No  Pain:   No reports of pain throughout session.   Therapy/Group: Individual Therapy  Ginny Forth , PT, DPT, CSRS  04/19/2020, 7:57 AM

## 2020-04-19 NOTE — Progress Notes (Signed)
Patient ID: Jordan [name], male   DOB: Feb 25, 1941, 80 y.o.   MRN: [id number]  Family education scheduled for Friday, January 21st and Monday, January 24th 9AM-12PM.  Lavera Guise, [place] [phone]

## 2020-04-19 NOTE — Patient Care Conference (Signed)
Inpatient RehabilitationTeam Conference and Plan of Care Update Date: 04/19/2020   Time: 10:48 AM    Patient Name: Jordan [name]      Medical Record Number: [id number]  Date of Birth: March 19, 1941 Sex: Male         Room/Bed: 4W06C/4W06C-01 Payor Info: Payor: [employer] MEDICARE / Plan: [employer] MEDICARE CHOICE PPO / Product Type: *No Product type* /    Admit Date/Time:  04/07/2020  2:32 PM  Primary Diagnosis:  Infarction of left basal ganglia [facility])  Hospital Problems: Principal Problem:   Infarction of left basal ganglia (HCC) Active Problems:   Diabetes mellitus type 2 in obese [facility])    Expected Discharge Date: Expected Discharge Date: 04/26/20  Team Members Present: Physician leading conference: Dr. Claudette Laws Care Coodinator Present: Lavera Guise, BSW;Aaliyah Cancro Lambert Mody, RN, BSN, CRRN Nurse Present: Magdalen Spatz) [place], LPN PT Present: Casimiro Needle, PT OT Present: Roney Mans, OT SLP Present: Suzzette Righter, CF-SLP PPS Coordinator present : Fae Pippin, SLP     Current Status/Progress Goal Weekly Team Focus  Bowel/Bladder   In and out cath q8hr. Cloudy urine with small blood clots. LBM 1/13  Pt will regain sensation to urinate and have a BM  UA labs pending, KUB done-awaiting plan from MD in regards to BM and urine with moderate WBC count   Swallow/Nutrition/ Hydration   regular textures, nectar liquids, Supervision-Min, MBSS revealed silent aspiration of thin  Supervision least restrictive diet  pharyngeal strengthening exercises, thin trials (conservative), carryover swallow strategies   ADL's   supervision for UB bathing with min assist for LB in sitting with lateral leans.  Mod assist for LB dressing with min to mod for stand pivot transfers using the RW.  Still with increased weakness in the RUE and RLE but uses the RUE functionally at a diminshed level.  Supervision  neuromuscular re-education, selfcare retraining, balance retraining, DME education, transfer  training, cognitive retraining   Mobility   min/mod assist bed mobility, min/mod assist sit<>stand and stand pivot transfers using RW with R lateral lean, min/mod assist gait up to 60ft using RW, delayed processing with poor dual-task and poor balance awareness and recovery strategies  supervision overall  bed mobility training, transfer training, gait training, activity tolerance, dynamic standing balance, pt/family education, R LE NMR   Communication   Min A increased vocal intensity  Supervision  compensatory strategies for voice/intelligibility   Safety/Cognition/ Behavioral Observations  Min A  Supervision  basic to mildly complex problem solving, daily recall, selective attention, emergent and anticipatory awareness   Pain   Pt denies pain at this time  Pt will remain free of pain  assess pain qshift/PRN meds   Skin   Pt has small abrasion to penis from previous condom cath  PT will be free of further skin breakdown  assess skin q shift/PRN     Discharge Planning:  d/c home with spouse. 1 level, ramp entrance   Team Discussion: Patient is drowsy with elevated WBC; UA ordered per MD and initiated abx. Constipation addressed with various modalities and KUB confirmed constipation. Continue HS IVF for dehydration and encourage po fluids. Progress limited by poor initiation, delayed processing, right lateral leaning, poor midline awareness and balance recovery with inability to weight shift of right side.   Patient on target to meet rehab goals: yes, currently supervision for upper body care and mod Assist for lower body ADLs. Min - max assist for transfers and able to ambulate with min - mod assist.  *See Care  Plan and progress notes for long and short-term goals.   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 fluid intake improved   Continued Need for Acute Rehabilitation Level of Care: The patient requires daily medical management by a physician with specialized training in physical medicine and rehabilitation for the following reasons: Direction of a multidisciplinary physical rehabilitation program to maximize functional independence : Yes Medical management of patient stability for increased activity during participation in an intensive rehabilitation regime.: Yes Analysis of laboratory values and/or radiology reports with any subsequent need for medication adjustment and/or medical intervention. : Yes   I attest that I was present, lead the team conference, and concur with the assessment and plan of the team.   Chana Bode B 04/19/2020, 3:12 PM

## 2020-04-20 ENCOUNTER — Inpatient Hospital Stay (HOSPITAL_COMMUNITY): Payer: No Typology Code available for payment source | Admitting: Speech Pathology

## 2020-04-20 ENCOUNTER — Inpatient Hospital Stay (HOSPITAL_COMMUNITY): Payer: No Typology Code available for payment source

## 2020-04-20 ENCOUNTER — Inpatient Hospital Stay (HOSPITAL_COMMUNITY): Payer: No Typology Code available for payment source | Admitting: Physical Therapy

## 2020-04-20 LAB — GLUCOSE, CAPILLARY
Glucose-Capillary: 97 mg/dL (ref 70–99)
Glucose-Capillary: 195 mg/dL — ABNORMAL HIGH (ref 70–99)
Glucose-Capillary: 262 mg/dL — ABNORMAL HIGH (ref 70–99)
Glucose-Capillary: 201 mg/dL — ABNORMAL HIGH (ref 70–99)

## 2020-04-20 MED ORDER — ONDANSETRON HCL 4 MG/2ML IJ SOLN
4.0000 mg | Freq: Three times a day (TID) | INTRAMUSCULAR | Status: DC | PRN
  Administered 2020-04-20: 4 mg via INTRAVENOUS
  Filled 2020-04-20: qty 2

## 2020-04-20 MED ORDER — POLYVINYL ALCOHOL 1.4 % OP SOLN
1.0000 [drp] | Freq: Two times a day (BID) | OPHTHALMIC | Status: DC
  Administered 2020-04-20 – 2020-04-25 (×11): 1 [drp] via OPHTHALMIC
  Filled 2020-04-20: qty 15

## 2020-04-20 MED ORDER — SORBITOL 70 % SOLN
60.0000 mL | Freq: Every day | Status: DC | PRN
  Administered 2020-04-20 – 2020-04-23 (×2): 60 mL via ORAL
  Filled 2020-04-20 (×2): qty 60

## 2020-04-20 MED ORDER — POLYETHYLENE GLYCOL 3350 17 G PO PACK
17.0000 g | PACK | Freq: Two times a day (BID) | ORAL | Status: DC
  Administered 2020-04-20 – 2020-04-24 (×7): 17 g via ORAL
  Filled 2020-04-20 (×9): qty 1

## 2020-04-20 MED ORDER — TAMSULOSIN HCL 0.4 MG PO CAPS
0.4000 mg | ORAL_CAPSULE | Freq: Every day | ORAL | Status: DC
  Administered 2020-04-20 – 2020-04-25 (×6): 0.4 mg via ORAL
  Filled 2020-04-20 (×6): qty 1

## 2020-04-20 NOTE — Progress Notes (Signed)
RT note. Pt. Placed on 10 of CPAP. Pt. Tolerating well at this time, RT will continue to monitor.

## 2020-04-20 NOTE — Progress Notes (Signed)
Speech Language Pathology Daily Session Note  Patient Details  Name: Jordan [name] MRN: [id number] Date of Birth: Nov 07, 1940  Today's Date: 04/20/2020 SLP Individual Time: 3335-4562 SLP Individual Time Calculation (min): 56 min  Short Term Goals: Week 2: SLP Short Term Goal 1 (Week 2): Pt will participate in pharyngeal exercises (Mendelsohn and Supraglottic swallow) to increse timing of swallow initation and consume trials of thin liquids with vital s/s remaining WFL and minimal overt s/s aspiration prior to repeat instrumental swallow assessment. SLP Short Term Goal 2 (Week 2): Pt will selectively attention to functional tasks for 30 minutes with Supervision A cues for redirection. SLP Short Term Goal 3 (Week 2): Pt will complete functional mildly complex to complex tasks with Supervision A verbal/visual cues for problem solving. SLP Short Term Goal 4 (Week 2): Pt will self-monitor and self-correct functional errors with supervision A verbal cues. SLP Short Term Goal 5 (Week 2): Pt will demonstrate recall of novel information with supervision A verbal cues. SLP Short Term Goal 6 (Week 2): Pt will use increased vocal intensity to increase his intelligibility at the sentence level with Supervision A verbal cues.  Skilled Therapeutic Interventions: Pt was seen for skilled ST targeting education with pt and his wife. SLP facilitate session with review of goals and progress in ST related to swallowing, speech, and cognition. SLP reviewed current diet recommendations (regular/nectar) and teach back method used for providing instruction for thickening liquids to nectar consistency. Handout for thickening instructions also provided. Also discussed plan to repeat MBSS next week before pt returns home, and SLP will update wife via phone if changes are made to liquid consistency. Review of strategies to optimize processing speed, attention, and short term memory discussed. Pt's wife acknowledged recommendation  for 24/7 supervision and need for assistance with complex tasks such as medication and finance management when he returns home. Pt left laying in bed with alarm set and needs within reach. Continue per current plan of care.          Pain Pain Assessment Pain Scale: Faces Faces Pain Scale: No hurt  Therapy/Group: Individual Therapy  [name] 04/20/2020, 7:30 AM

## 2020-04-20 NOTE — Progress Notes (Signed)
Pt unable to void after two attempts. Pt catheterized at this time. Turbid, cloudy orange urine returned. Small blood clots and moderate sediment noted. Pt tolerated well. PA Pam notified. New orders received.  Mylo Red, LPN

## 2020-04-20 NOTE — Progress Notes (Addendum)
PA Pam notified of pt emesis. New orders received.   Fleet enema given. Small bowel produced, mostly water. No complications noted. Gave pt zofran IV for nausea and vomiting.  Mylo Red, LPN

## 2020-04-20 NOTE — Progress Notes (Signed)
Occupational Therapy Session Note  Patient Details  Name: Jordan [name] MRN: [id number] Date of Birth: [birth year]  Today's Date: 04/20/2020 OT Individual Time: 3428-7681 OT Individual Time Calculation (min): 64 min    Short Term Goals: Week 2:  OT Short Term Goal 1 (Week 2): Pt will complete toilet transfer with min guard A. OT Short Term Goal 2 (Week 2): Pt will complete toileting with min assist. OT Short Term Goal 3 (Week 2): Pt will complete standing grooming for ~2 min with min A.  Skilled Therapeutic Interventions/Progress Updates:    Pt received semi-reclined in bed with nursing present, agreeable to therapy. Session focus on family education with wife re: dressing, toilet and shower transfers, and home set-up. Pt c/o of 8/10 pain "everywhere" at start of session, per RN pt premedicated prior to therapy.  Pt completed bed mobility as follows: Side roll > R with min A to place LUE on bed rail. Side lying > sit EOB with mod A to lift trunk. Pt doffed/donned shirt with close S, donned pants with RW + mod A for sit<>stand. Donned R shoe with total A. Amb to bathroom with CGA + mod VCs to stay inside the RW and longer R step length. Completed toilet and shower transfer with RW + CGA + use of grab bars. Wife planning to add front grab bar in shower, already has TTB. Discussed how pt may require min A for bathing buttocks via lateral leans. Wife facilitated 3x STS from the w/c then bed with overall min A for initial lift. Wife states she feels comfortable with STS transfer, discussed how pt will req min A for balance during LB clothing management. Wife facilitated stand-pivot transfer with RW + min A to w/c. Seated in w/c at sink, brushed teeth with close S, shaved face with min A for thoroughness from wife 2/2 fatigue. Provided written instructions reviewing session topics for wife and provided home measurement sheet in prep for PT session this pm. Left in w/c with NT and wife present, safety belt  alarm engaged, call bell in reach, and all immediate needs met.    Therapy Documentation Precautions:  Precautions Precautions: Fall Precaution Comments: L prosthetic Required Braces or Orthoses: Other Brace Other Brace: L prosthetic Restrictions Weight Bearing Restrictions: No Pain: Pain Assessment Pain Scale: 0-10 Pain Score: 8  Pain Type: Acute pain Pain Location: Other (Comment) ("everwhere") Pain Orientation: Lower Pain Intervention(s): Medication (See eMAR) ADL: See Care Tool for more details.   Therapy/Group: Individual Therapy  Volanda Napoleon, MS, OTR/L 04/20/2020, 12:11 PM

## 2020-04-20 NOTE — Progress Notes (Signed)
PA Dan notified of pt not being able to produce any stool and pt emesis. New orders received.  Mylo Red, LPN

## 2020-04-20 NOTE — Progress Notes (Signed)
[place] PHYSICAL MEDICINE & REHABILITATION PROGRESS NOTE   Subjective/Complaints:  Patient complains of requiring intermittent catheterization.  States that even at home he voids about every 8 hours.  Denies seeing urologist in the past. No BM since 1/18 No abdominal pain  ROS: Patient denies CP, SOB, N/V/D  Objective:   DG Abd 1 View  Result Date: 04/19/2020 CLINICAL DATA:  Abdominal pain and distension EXAM: ABDOMEN - 1 VIEW COMPARISON:  04/18/2020 FINDINGS: Small amount retained contrast throughout colon from cecum to rectum. Nonobstructive bowel gas pattern. No bowel dilatation or bowel wall thickening. Reservoir in RIGHT pelvis. No acute osseous findings. IMPRESSION: Scattered mild retained contrast throughout colon without evidence of bowel obstruction or dilatation. Electronically Signed   By: Lavonia Dana M.D.   On: 04/19/2020 08:16   DG Abd 1 View  Result Date: 04/18/2020 CLINICAL DATA:  Constipation for 1 day, history of diabetes mellitus EXAM: ABDOMEN - 1 VIEW COMPARISON:  None. FINDINGS: High attenuation enteric contrast media has traversed to the level of the rectum. There is a nonspecific paucity of small bowel gas but with an air-filled appearance of the colon. Passage of contrast argues against an obstructive process. No suspicious abdominal calcifications. Degenerative changes seen in the spine, hips and pelvis. IMPRESSION: 1. Nonspecific paucity of small bowel gas but with an air-filled appearance of the colon. Nonspecific but can be a normal finding particularly given passage of enteric contrast to the rectum which favors a nonobstructive process. Electronically Signed   By: Lovena Le M.D.   On: 04/18/2020 20:09   Recent Labs    04/19/20 0647  WBC 11.6*  HGB 12.6*  HCT 40.2  PLT 446*   Recent Labs    04/19/20 0647  NA 139  K 3.8  CL 101  CO2 28  GLUCOSE 101*  BUN 15  CREATININE 1.29*  CALCIUM 9.1    Intake/Output Summary (Last 24 hours) at 04/20/2020  0935 Last data filed at 04/20/2020 0809 Gross per 24 hour  Intake 240 ml  Output 1480 ml  Net -1240 ml        Physical Exam: Vital Signs Blood pressure (!) 149/70, pulse 76, temperature (!) 97.5 F (36.4 C), temperature source Oral, resp. rate 18, height '5\' 9"'  (1.753 m), weight 92.5 kg, SpO2 100 %.   General: No acute distress Mood and affect are appropriate Heart: Regular rate and rhythm no rubs murmurs or extra sounds Lungs: Clear to auscultation, breathing unlabored, no rales or wheezes Abdomen: Positive bowel sounds, soft nontender to palpation, nondistended Extremities: No clubbing, cyanosis, or edema Skin: No evidence of breakdown, no evidence of rash  Neuro: Pt is groggy from sleep but answers appropriatelyRight central 7 with dysarthria ongoing.  Sensory exam is normal. Reflexes are 2+ in all 4's. No tremors. RUE 4-/5 prox to distal with PD. RLE 4/5--motor exam unchanged today. 5/5 LUE and LLE.   Musculoskeletal: no pain with Right hip ROM or with AROM, no pain to palp over greater troch       Assessment/Plan: 1. Functional deficits which require 3+ hours per day of interdisciplinary therapy in a comprehensive inpatient rehab setting.  Physiatrist is providing close team supervision and 24 hour management of active medical problems listed below.  Physiatrist and rehab team continue to assess barriers to discharge/monitor patient progress toward functional and medical goals  Care Tool:  Bathing    Body parts bathed by patient: Right arm,Right upper leg,Left arm,Left upper leg,Chest,Right lower leg,Abdomen,Front perineal area,Face  Body parts bathed by helper: Buttocks Body parts n/a: Left lower leg   Bathing assist Assist Level: Minimal Assistance - Patient > 75%     Upper Body Dressing/Undressing Upper body dressing   What is the patient wearing?: Pull over shirt    Upper body assist Assist Level: Supervision/Verbal cueing    Lower Body  Dressing/Undressing Lower body dressing      What is the patient wearing?: Incontinence brief,Pants     Lower body assist Assist for lower body dressing: Moderate Assistance - Patient 50 - 74%     Toileting Toileting    Toileting assist Assist for toileting: Moderate Assistance - Patient 50 - 74%     Transfers Chair/bed transfer  Transfers assist     Chair/bed transfer assist level: Moderate Assistance - Patient 50 - 74% Chair/bed transfer assistive device: Walker,Armrests   Locomotion Ambulation   Ambulation assist      Assist level: Moderate Assistance - Patient 50 - 74% Assistive device: Walker-rolling Max distance: 54f   Walk 10 feet activity   Assist     Assist level: Moderate Assistance - Patient - 50 - 74% Assistive device: Walker-rolling   Walk 50 feet activity   Assist Walk 50 feet with 2 turns activity did not occur: Safety/medical concerns  Assist level: Moderate Assistance - Patient - 50 - 74% Assistive device: Walker-rolling    Walk 150 feet activity   Assist Walk 150 feet activity did not occur: Safety/medical concerns         Walk 10 feet on uneven surface  activity   Assist Walk 10 feet on uneven surfaces activity did not occur: Safety/medical concerns         Wheelchair     Assist Will patient use wheelchair at discharge?: Yes Type of Wheelchair: Manual    Wheelchair assist level: Supervision/Verbal cueing Max wheelchair distance: 30'    Wheelchair 50 feet with 2 turns activity    Assist        Assist Level: Supervision/Verbal cueing   Wheelchair 150 feet activity     Assist      Assist Level: Moderate Assistance - Patient 50 - 74%   Blood pressure (!) 149/70, pulse 76, temperature (!) 97.5 F (36.4 C), temperature source Oral, resp. rate 18, height '5\' 9"'  (1.753 m), weight 92.5 kg, SpO2 100 %.  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 Formulized individual treatment plan based on medical history, underlying problem and comorbidities.  2. Antithrombotics: -DVT/anticoagulation:Lovenox -antiplatelet therapy: Aspirin 81 mg daily and Plavix 75 mg daily x3 weeks then Plavix alone 3. Pain Management:Tylenol as needed- ? R hip pain , cannot reproduce PT will monitor while pt in therapy  4. Mood:Resume Lexapro 10 mg daily -antipsychotic agents: N/A 5. Neuropsych: This patientiscapable of making decisions on hisown behalf. 6. Skin/Wound Care:Routine skin checks 7. Fluids/Electrolytes/Nutrition:Routine in and outs with follow-up chemistries 8. Dysphagia. Carb modified nectar thick liquid.   -advance per speech therapy 9. Diabetes mellitus with obesity,peripheral neuropathy. Hemoglobin A1c 8.5.   -Home regimen:NovoLog 12units 3 times daily, Lantus insulin 40units twice daily.  -1/9 sugars remain elevated     CBG (last 3)  Recent Labs    04/19/20 1611 04/19/20 2046 04/20/20 0600  GLUCAP  210* 226* 97   Improve after increase of Lantus to 30U.  10. AKI on CKD stage III. Baseline creatinine 1.1-1.4. Cr normal at 1.12 on 1.14, repeat weekly 11. Permissive hypertension. Presently on Toprol-XL 25 mg daily. Patient on Norvasc 10 mg nightly, Cozaar 100 mg daily, Imdur 30 mg nightly prior to admission.   Vitals:   04/19/20 2301 04/20/20 0312  BP:  (!) 149/70  Pulse: 76 76  Resp: 16 18  Temp:  (!) 97.5 F (36.4 C)  SpO2: 97% 100%   cont. amlodipine 29m 12. Hyperlipidemia. Continue Zetia/Crestor 13. CAD with stenting. Continue aspirin and Plavix 14. Left BKA 2015.  Patient does have a prosthesis which appears to be in working order but is due for replacement soon per pt([employer])  15.  Constipation senna S 2 po TID,  increase sorbitol Urinary retention may be associated with UTI although would suspect that his age some degree of prostatic hypertrophy, will trial Flomax, continue Keflex.  Wife in room discussed status with the patient and wife. LOS: 13 days A FACE TO FACE EVALUATION WAS PERFORMED  ACharlett Blake1/20/2022, 9:35 AM

## 2020-04-20 NOTE — Progress Notes (Signed)
Physical Therapy Session Note  Patient Details  Name: Jordan [name] MRN: [id number] Date of Birth: 1941-01-27  Today's Date: 04/20/2020 PT Individual Time: 3491-7915 PT Individual Time Calculation (min): 31 min   and  Today's Date: 04/20/2020 PT Missed Time: 44 Minutes Missed Time Reason: Patient ill (Comment) (emesis)  Short Term Goals: Week 2:  PT Short Term Goal 1 (Week 2): Pt will perform supine<>sit with min assist PT Short Term Goal 2 (Week 2): Pt will perform sit<>stands using LRAD with min assist PT Short Term Goal 3 (Week 2): Pt will perform bed<>chair transfers using LRAD with min assist PT Short Term Goal 4 (Week 2): Pt will ambulate at least 28ft using LRAD with min assist  Skilled Therapeutic Interventions/Progress Updates:    Of note: Therapist discussed earlier in the day with Jesusita Oka, [place] regarding pt's lethargy - PA planning to assess pt's medications and discuss with MD.  Pt received asleep, supine in bed with his wife, Talbert Forest, present for education/training. Upon awakening, pt agreeable to therapy session but remains lethargic with eyes closed requiring frequent tactile and verbal cuing for increased alertness. Pt's wife reports education/training with OT and SLP went well with no questions/concerns at this time - she states feeling comfortable assisting pt at level of function demonstrated during OT session. Reports pt strongly prefers to get the power wheelchair as opposed to a custom manual wheelchair - pt also verbalizes this preference. Supine>sitting R EOB, HOB flat but using bedrail as pt has this at home, via logroll technique with min assist for rolling and mod assist for trunk upright. Pt reports significant low back pain during transition for supine>sitting - providing seated rest break EOB with pt reporting improving symptoms. Suddenly pt starts vomiting (yellow/orange undigested fruit) - provided wet washcloth and trash can. Assessed vitals immediately following: BP  189/89 (MAP 117), HR 109bpm and then a few minutes after: BP 148/78 (MAP 98), HR 88bpm, SpO2 100%. Pt reports that this is the worst he has felt since being in the hospital. Requests to lie down. Sit>supine, HOB elevated and bedrails available, with mod assist for B LE management into bed - pt again reporting significant low back pain during this transition. Doffed L LE prosthetic total assist per pt request. Pt left supine in bed with needs in reach, bed alarm on, and his wife present. Morrie Sheldon, RN notified of pt's emesis with pt's comments and wife's request to speak with RN or PA. Missed 44 minutes of skilled physical therapy.  Therapy Documentation Precautions:  Precautions Precautions: Fall Precaution Comments: L prosthetic Required Braces or Orthoses: Other Brace Other Brace: L prosthetic Restrictions Weight Bearing Restrictions: No  Pain: Reports low back pain during transition from supine<>sitting - provided repositioning and rest breaks - pt reports being premedicated.   Therapy/Group: Individual Therapy  Ginny Forth , PT, DPT, CSRS  04/20/2020, 12:43 PM

## 2020-04-21 ENCOUNTER — Encounter (HOSPITAL_COMMUNITY): Payer: No Typology Code available for payment source

## 2020-04-21 ENCOUNTER — Encounter (HOSPITAL_COMMUNITY): Payer: No Typology Code available for payment source | Admitting: Speech Pathology

## 2020-04-21 ENCOUNTER — Ambulatory Visit (HOSPITAL_COMMUNITY): Payer: No Typology Code available for payment source

## 2020-04-21 LAB — GLUCOSE, CAPILLARY
Glucose-Capillary: 104 mg/dL — ABNORMAL HIGH (ref 70–99)
Glucose-Capillary: 202 mg/dL — ABNORMAL HIGH (ref 70–99)
Glucose-Capillary: 163 mg/dL — ABNORMAL HIGH (ref 70–99)
Glucose-Capillary: 139 mg/dL — ABNORMAL HIGH (ref 70–99)

## 2020-04-21 LAB — CREATININE, SERUM
Creatinine, Ser: 1.25 mg/dL — ABNORMAL HIGH (ref 0.61–1.24)
GFR, Estimated: 59 mL/min — ABNORMAL LOW (ref >60)

## 2020-04-21 NOTE — Progress Notes (Signed)
[employer] PHYSICAL MEDICINE & REHABILITATION PROGRESS NOTE   Subjective/Complaints:  No issues overnite  ROS: Patient denies CP, SOB, N/V/D  Objective:   No results found. Recent Labs    04/19/20 0647  WBC 11.6*  HGB 12.6*  HCT 40.2  PLT 446*   Recent Labs    04/19/20 0647 04/21/20 0120  NA 139  --   K 3.8  --   CL 101  --   CO2 28  --   GLUCOSE 101*  --   BUN 15  --   CREATININE 1.29* 1.25*  CALCIUM 9.1  --     Intake/Output Summary (Last 24 hours) at 04/21/2020 0830 Last data filed at 04/21/2020 0323 Gross per 24 hour  Intake 160 ml  Output 925 ml  Net -765 ml        Physical Exam: Vital Signs Blood pressure (!) 146/75, pulse 75, temperature 98 F (36.7 C), temperature source Oral, resp. rate 18, height 5\' 9"  (1.753 m), weight 91.9 kg, SpO2 97 %.   General: No acute distress Mood and affect are appropriate Heart: Regular rate and rhythm no rubs murmurs or extra sounds Lungs: Clear to auscultation, breathing unlabored, no rales or wheezes Abdomen: Positive bowel sounds, soft nontender to palpation, nondistended Extremities: No clubbing, cyanosis, or edema Skin: No evidence of breakdown, no evidence of rash  Neuro: Pt is groggy from sleep but answers appropriatelyRight central 7 with dysarthria ongoing.  Sensory exam is normal. Reflexes are 2+ in all 4's. No tremors. RUE 4-/5 prox to distal with PD. RLE 4/5--motor exam unchanged today. 5/5 LUE and LLE.   Musculoskeletal: no pain with Right hip ROM or with AROM, no pain to palp over greater troch       Assessment/Plan: 1. Functional deficits which require 3+ hours per day of interdisciplinary therapy in a comprehensive inpatient rehab setting.  Physiatrist is providing close team supervision and 24 hour management of active medical problems listed below.  Physiatrist and rehab team continue to assess barriers to discharge/monitor patient progress toward functional and medical goals  Care  Tool:  Bathing    Body parts bathed by patient: Right arm,Right upper leg,Left arm,Left upper leg,Chest,Right lower leg,Abdomen,Front perineal area,Face   Body parts bathed by helper: Buttocks Body parts n/a: Left lower leg   Bathing assist Assist Level: Minimal Assistance - Patient > 75%     Upper Body Dressing/Undressing Upper body dressing   What is the patient wearing?: Pull over shirt    Upper body assist Assist Level: Supervision/Verbal cueing    Lower Body Dressing/Undressing Lower body dressing      What is the patient wearing?: Pants     Lower body assist Assist for lower body dressing: Moderate Assistance - Patient 50 - 74%     Toileting Toileting    Toileting assist Assist for toileting: Moderate Assistance - Patient 50 - 74%     Transfers Chair/bed transfer  Transfers assist     Chair/bed transfer assist level: Minimal Assistance - Patient > 75% Chair/bed transfer assistive device: Walker,Armrests   Locomotion Ambulation   Ambulation assist      Assist level: Contact Guard/Touching assist Assistive device: Walker-rolling Max distance: 10 ft   Walk 10 feet activity   Assist     Assist level: Moderate Assistance - Patient - 50 - 74% Assistive device: Walker-rolling   Walk 50 feet activity   Assist Walk 50 feet with 2 turns activity did not occur: Safety/medical concerns  Assist  level: Moderate Assistance - Patient - 50 - 74% Assistive device: Walker-rolling    Walk 150 feet activity   Assist Walk 150 feet activity did not occur: Safety/medical concerns         Walk 10 feet on uneven surface  activity   Assist Walk 10 feet on uneven surfaces activity did not occur: Safety/medical concerns         Wheelchair     Assist Will patient use wheelchair at discharge?: Yes Type of Wheelchair: Manual    Wheelchair assist level: Supervision/Verbal cueing Max wheelchair distance: 30'    Wheelchair 50 feet with 2  turns activity    Assist        Assist Level: Supervision/Verbal cueing   Wheelchair 150 feet activity     Assist      Assist Level: Moderate Assistance - Patient 50 - 74%   Blood pressure (!) 146/75, pulse 75, temperature 98 F (36.7 C), temperature source Oral, resp. rate 18, height 5\' 9"  (1.753 m), weight 91.9 kg, SpO2 97 %.  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 Antithrombotics: -DVT/anticoagulation:Lovenox -antiplatelet therapy: Aspirin 81 mg daily and Plavix 75 mg daily x3 weeks then Plavix alone 3. Pain Management:Tylenol as needed- ? R hip pain , cannot reproduce PT will monitor while pt in therapy  4. Mood:Resume Lexapro 10 mg daily -antipsychotic agents: N/A 5. Neuropsych: This patientiscapable of making decisions on hisown behalf. 6. Skin/Wound Care:Routine skin checks 7. Fluids/Electrolytes/Nutrition:Routine in and outs with follow-up chemistries 8. Dysphagia. Carb modified nectar thick liquid.   -advance per speech therapy 9. Diabetes mellitus with obesity,peripheral neuropathy. Hemoglobin A1c 8.5.   -Home regimen:NovoLog 12units 3 times daily, Lantus insulin 40units twice daily.  -1/9 sugars remain elevated     CBG (last 3)  Recent Labs    04/20/20 1643 04/20/20 2108 04/21/20 0607  GLUCAP 262* 201* 104*   Improve after increase of Lantus to 30U.On Victoza at home   10. AKI on CKD stage III. Baseline creatinine 1.1-1.4. Cr normal at 1.12 on 1.14, repeat weekly 11. Permissive hypertension. Presently on Toprol-XL 25 mg daily. Patient on Norvasc 10 mg nightly, Cozaar 100 mg daily, Imdur 30 mg nightly prior to admission.   Vitals:   04/20/20 1946 04/21/20 0301  BP: (!) 156/79  (!) 146/75  Pulse: 79 75  Resp: 18 18  Temp: 98.4 F (36.9 C) 98 F (36.7 C)  SpO2: 96% 97%   cont. amlodipine 10mg  12. Hyperlipidemia. Continue Zetia/Crestor 13. CAD with stenting. Continue aspirin and Plavix 14. 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  Constipation senna S 2 po TID,  increase sorbitol Urinary retention may be associated with UTI although would suspect that his age some degree of prostatic hypertrophy, will trial Flomax, continue Keflex.  Wife in room discussed status with the patient and wife. LOS: 14 days A FACE TO FACE EVALUATION WAS PERFORMED  04/23/20 04/21/2020, 8:30 AM

## 2020-04-21 NOTE — Plan of Care (Signed)
°  Problem: RH Problem Solving Goal: LTG Patient will demonstrate problem solving for (SLP) Description: LTG:  Patient will demonstrate problem solving for basic/complex daily situations with cues  (SLP) Flowsheets (Taken 04/21/2020 0754) LTG Patient will demonstrate problem solving for: Minimal Assistance - Patient > 75% Note: Downgraded due to slower than anticipated progress   Problem: RH Memory Goal: LTG Patient will demonstrate ability for day to day (SLP) Description: LTG:   Patient will demonstrate ability for day to day recall/carryover during cognitive/linguistic activities with assist  (SLP) Flowsheets (Taken 04/21/2020 0754) LTG: Patient will demonstrate ability for day to day recall/carryover during cognitive/linguistic activities with assist (SLP): Minimal Assistance - Patient > 75% Note: Downgraded due to slower than anticipated progress

## 2020-04-21 NOTE — Progress Notes (Signed)
Speech Language Pathology Weekly Progress and Session Note  Patient Details  Name: Jordan [name] MRN: [id number] Date of Birth: [birth year]  Beginning of progress report period: April 14, 2020 End of progress report period: April 21, 2020  Today's Date: 04/21/2020 SLP Individual Time: 1000-1059 SLP Individual Time Calculation (min): 59 min  Short Term Goals: Week 2: SLP Short Term Goal 1 (Week 2): Pt will participate in pharyngeal exercises (Mendelsohn and Supraglottic swallow) to increse timing of swallow initation and consume trials of thin liquids with vital s/s remaining WFL and minimal overt s/s aspiration prior to repeat instrumental swallow assessment. SLP Short Term Goal 1 - Progress (Week 2): Met SLP Short Term Goal 2 (Week 2): Pt will selectively attention to functional tasks for 30 minutes with Supervision A cues for redirection. SLP Short Term Goal 2 - Progress (Week 2): Progressing toward goal SLP Short Term Goal 3 (Week 2): Pt will complete functional mildly complex to complex tasks with Supervision A verbal/visual cues for problem solving. SLP Short Term Goal 3 - Progress (Week 2): Progressing toward goal SLP Short Term Goal 4 (Week 2): Pt will self-monitor and self-correct functional errors with supervision A verbal cues. SLP Short Term Goal 4 - Progress (Week 2): Progressing toward goal SLP Short Term Goal 5 (Week 2): Pt will demonstrate recall of novel information with supervision A verbal cues. SLP Short Term Goal 5 - Progress (Week 2): Progressing toward goal SLP Short Term Goal 6 (Week 2): Pt will use increased vocal intensity to increase his intelligibility at the sentence level with Supervision A verbal cues. SLP Short Term Goal 6 - Progress (Week 2): Met    New Short Term Goals: Week 3: SLP Short Term Goal 1 (Week 3): STG=LTG due to remaining length of stay  Weekly Progress Updates: Pt has made slow gains and fully met 2 out of 6 short term goals this reporting  period. Pt is currently most consistently Min assist for mildly complex tasks due to cognitive impairments impacting his emergent awareness, selective attention, processing speed, short term memory, and problem solving abilities. His speech intelligibility is also slightly due diminished vocal intensity and somewhat hoarse vocal quality, requiring Supervision-Min A cues for use of compensatory strategies. Pt is consuming a regular texture diet with nectar thick liquids with Supervision A for use of safe compensatory swallow strategies. Last MBSS indicated silent aspiration of thin, therefore thin trials at bedside have been conservative and more of a focus on pharyngeal strengthening exercises to prepare for another MBSS next week. Pt and family education is ongoing. Pt would continue to benefit from skilled ST while inpatient in order to maximize functional independence and reduce burden of care prior to discharge. Anticipate that pt will need 24/7 supervision at discharge in addition to Bailey follow up at next level of care.      Intensity: Minumum of 1-2 x/day, 30 to 90 minutes Frequency: 3 to 5 out of 7 days Duration/Length of Stay: 04/26/20 Treatment/Interventions: Cueing hierarchy;Functional tasks;Cognitive remediation/compensation;Environmental controls;Internal/external aids;Patient/family education;Dysphagia/aspiration precaution training;Speech/Language facilitation   Daily Session  Skilled Therapeutic Interventions: Pt was seen for skilled ST targeting swallow and cognitive goals. SLP facilitated session by introducing EMST [facility] device set to 12 cm H2O resistance for voice/breath support and pharyngeal strengthening. Pt required Mod A verbal cueing for accuracy in use of device, but did complete 3 sets of 10 exercises with device set to this level. His self perceived difficulty level rating was only a 1/10, but when attempted  higher resistance, it was too difficult for him to perform  accurately. Suspect low self rating is more related to pt's decreased overall awareness, and inability to accurately judge. He also accepted trials of ice chips after oral care with no overt s/sx aspiration, although it is noted that pt does have hx of silent aspiration, therefore it is difficult to determine pharyngeal integrity at bedside. Recommend repeating MBSS Monday 04/24/20 to reassess potential for upgrade prior to his d/c home later that week.  SLP further facilitated session with a medication management task. He required Moderate cues to recall current medication names and functions. SLP created list as external aid for pt to aid in future recall and for use in activity. Pt organized a TID pill box with Min A verbal and visual cues for error awareness and problem solving. Pt did demonstrate ability to self detect and correct some errors (~25% of the time). He also required Min A verbal cues for functional use of his daily drinking log as compensatory memory strategy to track fluids throughout the day. Pt left sitting in wheelchair with alarm set and needs within reach. Continue per current plan of care.            Pain Pain Assessment Pain Scale: Faces  Faces Pain Scale: No hurt   Therapy/Group: Individual Therapy  Arbutus Leas 04/21/2020, 7:24 AM

## 2020-04-21 NOTE — Progress Notes (Signed)
Occupational Therapy Session Note  Patient Details  Name: Jordan [name] MRN: [id number] Date of Birth: 1940/10/01  Today's Date: 04/21/2020 OT Individual Time: 0905-1000 OT Individual Time Calculation (min): 55 min    Short Term Goals: Week 2:  OT Short Term Goal 1 (Week 2): Pt will complete toilet transfer with min guard A. OT Short Term Goal 2 (Week 2): Pt will complete toileting with min assist. OT Short Term Goal 3 (Week 2): Pt will complete standing grooming for ~2 min with min A.  Skilled Therapeutic Interventions/Progress Updates:    Pt received in bed in chair position eating breakfast, agreeable to therapy. Session focus on dressing EOB, seated grooming, and standing activity tolerance/dynamic standing balance.  Pt completed bed mobility as follows: Side roll > L with min A to place RUE on bed rail. Side lying > sit EOB with mod A to lift trunk. Seated EOB, donned LLE prosthesis with min A to reposition, donned pants with mod A for sit<>stand with RW. Donned R shoe with total A. Noted mild R lean and posterior bias in standing throughout session. Amb to w/c at sink with min A for balance. Completed seated grooming with set-up A. Self-propelled w/c ~15 ft with short/inefficient strokes despite mod VCs for improved technique, transported rest of the way to gym 2/2 time management and energy conservation. In prep for standing bimanual ADLs, completed 2 STS with RW to complete visuoconstructive task, requiring max A to complete accurately due to decreased problem solving and processing. Able to stand for ~45 secs at a time before initiating seated rest break, req min to mod A for balance 2/2 R lean + min TCs for R knee extension. Transported back to room and pt left in w/c with safety belt alarm engaged, call bell in reach, and all immediate needs met. Pt c/o of back pain at beginning of session, did not quantity and not requesting additional rx.   Therapy Documentation Precautions:   Precautions Precautions: Fall Precaution Comments: L prosthetic Required Braces or Orthoses: Other Brace Other Brace: L prosthetic Restrictions Weight Bearing Restrictions: (P) No Pain: Pain Assessment Pain Scale: Faces Pain Score: 5  Faces Pain Scale: Hurts a little bit Pain Type: Acute pain Pain Location: Back Pain Orientation: Right;Left Pain Intervention(s): Medication (See eMAR) ADL: See Care Tool for more details.  Therapy/Group: Individual Therapy  Volanda Napoleon, MS, OTR/L 04/21/2020, 7:59 AM

## 2020-04-21 NOTE — Progress Notes (Signed)
Physical Therapy Session Note  Patient Details  Name: Jordan [name] MRN: [id number] Date of Birth: Mar 07, 1941  Today's Date: 04/21/2020 PT Individual Time: 1100-1155 PT Individual Time Calculation (min): 55 min   Short Term Goals: Week 2:  PT Short Term Goal 1 (Week 2): Pt will perform supine<>sit with min assist PT Short Term Goal 2 (Week 2): Pt will perform sit<>stands using LRAD with min assist PT Short Term Goal 3 (Week 2): Pt will perform bed<>chair transfers using LRAD with min assist PT Short Term Goal 4 (Week 2): Pt will ambulate at least 70ft using LRAD with min assist  Skilled Therapeutic Interventions/Progress Updates:    Patient received sitting up in standard wc, agreeable to PT. He denies pain. Patient able to come to stand with ModA and RW. He ambulated ~17ft toward power wc to trial with MinA and consistent verbal cues for longer R step length. Patient requires up to [facility] to manage walker to make sure it's not too far in front of him, especially when turning. PT orienting patient to wc speeds and levels that are appropriate for indoor use. Patient able to negotiate wc through open hallway at 0.82mph and verbal cues for safety. Patient requiring increased verbal cues to slow down during turns and avoid people. Patient with very slow processing to PTs cues to avoid people/obstacles. He had significantly increased difficulty maneuvering wc in home-like setting with tight turns and many obstacles. Patient often hitting furniture/walls with little awareness of what he was doing. PT needing to intervene multiple times to limit damage to furniture/walls. PT explaining to patient that he has to demonstrate safety and competency with these trials prior to receiving power wc for home use upon dc. Patient demonstrating very poor insight into his deficits at this time. He is likely unsafe to use power wc at home at this time. Patient returning to room in wc, transferring back to standard chair for  safety. Seatbelt alarm on, call light within reach.   Therapy Documentation Precautions:  Precautions Precautions: Fall Precaution Comments: L prosthetic Required Braces or Orthoses: Other Brace Other Brace: L prosthetic Restrictions Weight Bearing Restrictions: (P) No    Therapy/Group: Individual Therapy  Elizebeth Koller, PT, DPT, CBIS  04/21/2020, 7:40 AM

## 2020-04-22 ENCOUNTER — Inpatient Hospital Stay (HOSPITAL_COMMUNITY): Payer: No Typology Code available for payment source | Admitting: Physical Therapy

## 2020-04-22 ENCOUNTER — Inpatient Hospital Stay (HOSPITAL_COMMUNITY): Payer: No Typology Code available for payment source

## 2020-04-22 LAB — URINE CULTURE: Culture: >=100000 — AB

## 2020-04-22 LAB — GLUCOSE, CAPILLARY
Glucose-Capillary: 103 mg/dL — ABNORMAL HIGH (ref 70–99)
Glucose-Capillary: 163 mg/dL — ABNORMAL HIGH (ref 70–99)
Glucose-Capillary: 213 mg/dL — ABNORMAL HIGH (ref 70–99)
Glucose-Capillary: 120 mg/dL — ABNORMAL HIGH (ref 70–99)

## 2020-04-22 MED ORDER — AMOXICILLIN-POT CLAVULANATE 875-125 MG PO TABS
1.0000 | ORAL_TABLET | Freq: Two times a day (BID) | ORAL | Status: DC
  Administered 2020-04-22 – 2020-04-26 (×8): 1 via ORAL
  Filled 2020-04-22 (×8): qty 1

## 2020-04-22 NOTE — Progress Notes (Signed)
Occupational Therapy Session Note  Patient Details  Name: Jordan [name] MRN: [id number] Date of Birth: 1940-12-07  Today's Date: 04/22/2020 OT Individual Time: 1340-1440 OT Individual Time Calculation (min): 60 min    Short Term Goals: Week 2:  OT Short Term Goal 1 (Week 2): Pt will complete toilet transfer with min guard A. OT Short Term Goal 2 (Week 2): Pt will complete toileting with min assist. OT Short Term Goal 3 (Week 2): Pt will complete standing grooming for ~2 min with min A.  Skilled Therapeutic Interventions/Progress Updates:    Pt received semi-reclined in bed, agreeable to therapy. Session focus on LB dressing, functional transfers, and activity tolerance.  ADL: Pt completed bed mobility as follows: Side roll > R with min A to place LUE on bed rail. Side lying > sit EOB with mod A to lift trunk + bring RLE off bed. Donned pants with mod A for STS, donned LLE prosthetic with close S 2/2 decreased dynamic sitting balance. Donned R shoe with total A. Stand-pivot to w/c throughout session with RW + min A for initial lift + balance 2/2 min R lean. Completed seated grooming at sink with close S. Self-propelled w/c ~40 ft with short, inefficient strokes despite max TCs/VCs for improved technique. Transported rest of the way 2/2 energy conservation + time management.  Therapeutic Activity: Stand-pivot to mat same manner as above. Completed various dynamic standing activities involving placing and retrieving target from head/knee height on R side. Overall completed activity with CGA for balance + min TCs to facilitate R knee extension. Pt req seated rest break after ~45 to 1 min of standing activity, tolerance decreasing to ~20 seconds towards end of session. Stand-pivot back to w/c, then back to bed. Doffed LLE prosthetic with min A to push release button. Sitting EOB > supine with mod A to bring BLE onto bed.   Pt left semi-reclined in bed with bed alarm engaged, call bell in reach, and  all immediate needs met.    Therapy Documentation Precautions:  Precautions Precautions: Fall Precaution Comments: L prosthetic Required Braces or Orthoses: Other Brace Other Brace: L prosthetic Restrictions Weight Bearing Restrictions: No Pain: no c/o pain throughout session   ADL: See Care Tool for more details.   Therapy/Group: Individual Therapy  Volanda Napoleon, MS, OTR/L 04/22/2020, 7:54 AM

## 2020-04-22 NOTE — Plan of Care (Signed)
  Problem: RH BLADDER ELIMINATION Goal: RH STG MANAGE BLADDER WITH ASSISTANCE Description: STG Manage Bladder With mod I Assistance Outcome: Not Progressing; in and out cath q8

## 2020-04-22 NOTE — Progress Notes (Signed)
[facility] PHYSICAL MEDICINE & REHABILITATION PROGRESS NOTE   Subjective/Complaints:  Pt reports slept OK.  Doesn't remember when had last BM, but denies constipation.  LBM documented 1/20 in AM- is incontinent; also requiring I/o caths to empty bladder.      ROS. Impaired due to cognition  Objective:   No results found. No results for input(s): WBC, HGB, HCT, PLT in the last 72 hours. Recent Labs    04/21/20 0120  CREATININE 1.25*    Intake/Output Summary (Last 24 hours) at 04/22/2020 1352 Last data filed at 04/22/2020 0830 Gross per 24 hour  Intake 220 ml  Output 1450 ml  Net -1230 ml        Physical Exam: Vital Signs Blood pressure 129/62, pulse 77, temperature 97.9 F (36.6 C), resp. rate 17, height 5\' 9"  (1.753 m), weight 93.4 kg, SpO2 98 %.   General: No acute distress- sitting up in w/c at bedside, but asleep initially, NAD- IV in place- not using currently.  Mood and affect are a flat Heart: RRR Lungs: CTA B/L- no W/R/R- good air movement Abdomen: Soft, NT, ND, (+)BS  Extremities: No clubbing, cyanosis, or edema Skin: No evidence of breakdown, no evidence of rash  Neuro: pt sleepy- poor memory;  Right central 7 with dysarthria ongoing.  Sensory exam is normal. Reflexes are 2+ in all 4's. No tremors. RUE 4-/5 prox to distal with PD. RLE 4/5--motor exam unchanged today. 5/5 LUE and LLE.   Musculoskeletal: no pain with Right hip ROM or with AROM, no pain to palp over greater troch       Assessment/Plan: 1. Functional deficits which require 3+ hours per day of interdisciplinary therapy in a comprehensive inpatient rehab setting.  Physiatrist is providing close team supervision and 24 hour management of active medical problems listed below.  Physiatrist and rehab team continue to assess barriers to discharge/monitor patient progress toward functional and medical goals  Care Tool:  Bathing    Body parts bathed by patient: Right arm,Right upper  leg,Left arm,Left upper leg,Chest,Right lower leg,Abdomen,Front perineal area,Face   Body parts bathed by helper: Buttocks Body parts n/a: Left lower leg   Bathing assist Assist Level: Minimal Assistance - Patient > 75%     Upper Body Dressing/Undressing Upper body dressing   What is the patient wearing?: Pull over shirt    Upper body assist Assist Level: Supervision/Verbal cueing    Lower Body Dressing/Undressing Lower body dressing      What is the patient wearing?: Pants     Lower body assist Assist for lower body dressing: Moderate Assistance - Patient 50 - 74%     Toileting Toileting    Toileting assist Assist for toileting: Moderate Assistance - Patient 50 - 74%     Transfers Chair/bed transfer  Transfers assist     Chair/bed transfer assist level: Minimal Assistance - Patient > 75% Chair/bed transfer assistive device: Walker,Armrests   Locomotion Ambulation   Ambulation assist      Assist level: Minimal Assistance - Patient > 75% Assistive device: Walker-rolling Max distance: 10 ft   Walk 10 feet activity   Assist     Assist level: Minimal Assistance - Patient > 75% Assistive device: Walker-rolling   Walk 50 feet activity   Assist Walk 50 feet with 2 turns activity did not occur: Safety/medical concerns  Assist level: Moderate Assistance - Patient - 50 - 74% Assistive device: Walker-rolling    Walk 150 feet activity   Assist Walk 150 feet  activity did not occur: Safety/medical concerns         Walk 10 feet on uneven surface  activity   Assist Walk 10 feet on uneven surfaces activity did not occur: Safety/medical concerns         Wheelchair     Assist Will patient use wheelchair at discharge?: Yes Type of Wheelchair: Power    Wheelchair assist level: Supervision/Verbal cueing,Contact Guard/Touching assist Max wheelchair distance: 30'    Wheelchair 50 feet with 2 turns activity    Assist        Assist  Level: Supervision/Verbal cueing,Contact Guard/Touching assist   Wheelchair 150 feet activity     Assist      Assist Level: Supervision/Verbal cueing,Contact Guard/Touching assist   Blood pressure 129/62, pulse 77, temperature 97.9 F (36.6 C), resp. rate 17, height 5\' 9"  (1.753 m), weight 93.4 kg, SpO2 98 %.  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 Antithrombotics: -DVT/anticoagulation:Lovenox -antiplatelet therapy: Aspirin 81 mg daily and Plavix 75 mg daily x3 weeks then Plavix alone 3. Pain Management:Tylenol as needed- ? R hip pain , cannot reproduce PT will monitor while pt in therapy  4. Mood:Resume Lexapro 10 mg daily -antipsychotic agents: N/A 5. Neuropsych: This patientiscapable of making decisions on hisown behalf. 6. Skin/Wound Care:Routine skin checks 7. Fluids/Electrolytes/Nutrition:Routine in and outs with follow-up chemistries 8. Dysphagia. Carb modified nectar thick liquid.   1/22- still has nectar thick liquids- Bun was last at 15- but not checked since 1/19- will need to recheck Monday  -advance per speech therapy 9. Diabetes mellitus with obesity,peripheral neuropathy. Hemoglobin A1c 8.5.   -Home regimen:NovoLog 12units 3 times daily, Lantus insulin 40units twice daily.  -1/9 sugars remain elevated     CBG (last 3)  Recent Labs    04/21/20 2115 04/22/20 0629 04/22/20 1137  GLUCAP 139* 103* 163*   Improve after increase of Lantus to 30U.On Victoza at home   122- BGs 103-202 in last 24 hours- ~1/2 elevated; 1/2 are not- con't regimen since just increased lantus 10. AKI on CKD stage III. Baseline creatinine 1.1-1.4. Cr normal at 1.12 on 1.14, repeat weekly 11. Permissive hypertension. Presently  on Toprol-XL 25 mg daily. Patient on Norvasc 10 mg nightly, Cozaar 100 mg daily, Imdur 30 mg nightly prior to admission.   Vitals:   04/22/20 0936 04/22/20 1318  BP: (!) 142/70 129/62  Pulse: 79 77  Resp:  17  Temp:  97.9 F (36.6 C)  SpO2:  98%   cont. amlodipine 10mg  12. Hyperlipidemia. Continue Zetia/Crestor 13. CAD with stenting. Continue aspirin and Plavix 14. 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  Constipation senna S 2 po TID,  increase sorbitol Urinary retention may be associated with UTI although would suspect that his age some degree of prostatic hypertrophy, will trial Flomax, continue Keflex.  1/22- will change ABX- Keflex not susceptible  is growing Staph Aureus AND enterococcus- per pharmacy will start Augmentin  (not allergic to PCN)-   - con't I/o caths with flomax- hopefully will be able to void.  16. Bowel incontinence  1/22- likely due to stroke-if doesn't improve, and pt cannot tell, could benefit from bowel program if hasn't improved by 30+ days.      LOS: 15 days  A FACE TO FACE EVALUATION WAS PERFORMED  Samaiya Awadallah 04/22/2020, 1:52 PM

## 2020-04-23 ENCOUNTER — Inpatient Hospital Stay (HOSPITAL_COMMUNITY): Payer: Medicare PPO | Admitting: Physical Therapy

## 2020-04-23 LAB — GLUCOSE, CAPILLARY
Glucose-Capillary: 71 mg/dL (ref 70–99)
Glucose-Capillary: 168 mg/dL — ABNORMAL HIGH (ref 70–99)
Glucose-Capillary: 137 mg/dL — ABNORMAL HIGH (ref 70–99)
Glucose-Capillary: 150 mg/dL — ABNORMAL HIGH (ref 70–99)

## 2020-04-23 NOTE — Plan of Care (Signed)
  Problem: RH BOWEL ELIMINATION Goal: RH STG MANAGE BOWEL WITH ASSISTANCE Description: STG Manage Bowel with mod I Assistance. Outcome: Progressing; had several loose stools after laxatives given   Problem: RH BLADDER ELIMINATION Goal: RH STG MANAGE BLADDER WITH ASSISTANCE Description: STG Manage Bladder With mod I Assistance Outcome: Not Progressing; I and o cath

## 2020-04-23 NOTE — Progress Notes (Signed)
Physical Therapy Session Note  Patient Details  Name: Jordan [name] MRN: [id number] Date of Birth: [birth year]  Today's Date: 04/23/2020 PT Individual Time: 0900-0930 PT Individual Time Calculation (min): 30 min  PT Amount of Missed Time (min): 15 Minutes PT Missed Treatment Reason: Nursing care  Short Term Goals: Week 2:  PT Short Term Goal 1 (Week 2): Pt will perform supine<>sit with min assist PT Short Term Goal 2 (Week 2): Pt will perform sit<>stands using LRAD with min assist PT Short Term Goal 3 (Week 2): Pt will perform bed<>chair transfers using LRAD with min assist PT Short Term Goal 4 (Week 2): Pt will ambulate at least 83ft using LRAD with min assist  Skilled Therapeutic Interventions/Progress Updates:    Pt initially received in bed with nursing assisting with pericare following BM. Pt missed 15 min of scheduled therapy session for nursing care. Pt then agreeable to participate in therapy session. Pt reports some back pain, not rated. Nursing notified that pt requesting Tylenol for pain management. Assisted pt with donning pants at bed level with max A. Supine to sit with max A for LE management and trunk elevation. Pt initially with heavy lean to the R and posteriorly with mod A required for sitting balance progressing to CGA. Assisted pt with donning LLE prosthetic while seated EOB. Sit to stand with mod A to RW, stand pivot transfer to w/c with RW and mod A. Dependent transport via w/c to/from therapy gym for time conservation. Ambulation 2 x 10 ft with RW and mod A for balance with cues for increased step length and LE clearance during gait.. Pt demonstrates fair ability to follow cues. Pt exhibits delayed processing and increased time needed to follow cues throughout session. Pt left seated in w/c in room with needs in reach, quick release belt and chair alarm in place at end of session.  Therapy Documentation Precautions:  Precautions Precautions: Fall Precaution Comments: L  prosthetic Required Braces or Orthoses: Other Brace Other Brace: L prosthetic Restrictions Weight Bearing Restrictions: No General: PT Amount of Missed Time (min): 15 Minutes PT Missed Treatment Reason: Nursing care    Therapy/Group: Individual Therapy   Peter Congo, PT, DPT  04/23/2020, 12:10 PM

## 2020-04-24 ENCOUNTER — Inpatient Hospital Stay (HOSPITAL_COMMUNITY): Payer: No Typology Code available for payment source

## 2020-04-24 ENCOUNTER — Encounter (HOSPITAL_COMMUNITY): Payer: Medicare PPO

## 2020-04-24 ENCOUNTER — Inpatient Hospital Stay (HOSPITAL_COMMUNITY): Payer: Medicare PPO | Admitting: Speech Pathology

## 2020-04-24 ENCOUNTER — Inpatient Hospital Stay (HOSPITAL_COMMUNITY): Payer: Medicare PPO

## 2020-04-24 ENCOUNTER — Encounter (HOSPITAL_COMMUNITY): Payer: Medicare PPO | Admitting: Speech Pathology

## 2020-04-24 ENCOUNTER — Ambulatory Visit (HOSPITAL_COMMUNITY): Payer: No Typology Code available for payment source

## 2020-04-24 LAB — COMPREHENSIVE METABOLIC PANEL
ALT: 54 U/L — ABNORMAL HIGH (ref 0–44)
AST: 46 U/L — ABNORMAL HIGH (ref 15–41)
Albumin: 2.3 g/dL — ABNORMAL LOW (ref 3.5–5.0)
Alkaline Phosphatase: 125 U/L (ref 38–126)
Anion gap: 9 (ref 5–15)
BUN: 10 mg/dL (ref 8–23)
CO2: 24 mmol/L (ref 22–32)
Calcium: 8.7 mg/dL — ABNORMAL LOW (ref 8.9–10.3)
Chloride: 100 mmol/L (ref 98–111)
Creatinine, Ser: 1.06 mg/dL (ref 0.61–1.24)
GFR, Estimated: >60 mL/min (ref >60)
Glucose, Bld: 121 mg/dL — ABNORMAL HIGH (ref 70–99)
Potassium: 3.5 mmol/L (ref 3.5–5.1)
Sodium: 133 mmol/L — ABNORMAL LOW (ref 135–145)
Total Bilirubin: 0.7 mg/dL (ref 0.3–1.2)
Total Protein: 7.3 g/dL (ref 6.5–8.1)

## 2020-04-24 LAB — CBC WITH DIFFERENTIAL/PLATELET
Abs Immature Granulocytes: 0.03 10*3/uL (ref 0.00–0.07)
Basophils Absolute: 0.1 10*3/uL (ref 0.0–0.1)
Basophils Relative: 1 %
Eosinophils Absolute: 0.2 10*3/uL (ref 0.0–0.5)
Eosinophils Relative: 2 %
HCT: 34.2 % — ABNORMAL LOW (ref 39.0–52.0)
Hemoglobin: 11.5 g/dL — ABNORMAL LOW (ref 13.0–17.0)
Immature Granulocytes: 0 %
Lymphocytes Relative: 22 %
Lymphs Abs: 1.9 10*3/uL (ref 0.7–4.0)
MCH: 28.7 pg (ref 26.0–34.0)
MCHC: 33.6 g/dL (ref 30.0–36.0)
MCV: 85.3 fL (ref 80.0–100.0)
Monocytes Absolute: 0.7 10*3/uL (ref 0.1–1.0)
Monocytes Relative: 9 %
Neutro Abs: 5.5 10*3/uL (ref 1.7–7.7)
Neutrophils Relative %: 66 %
Platelets: 384 10*3/uL (ref 150–400)
RBC: 4.01 MIL/uL — ABNORMAL LOW (ref 4.22–5.81)
RDW: 13.5 % (ref 11.5–15.5)
WBC: 8.4 10*3/uL (ref 4.0–10.5)
nRBC: 0 % (ref 0.0–0.2)

## 2020-04-24 LAB — GLUCOSE, CAPILLARY
Glucose-Capillary: 119 mg/dL — ABNORMAL HIGH (ref 70–99)
Glucose-Capillary: 215 mg/dL — ABNORMAL HIGH (ref 70–99)
Glucose-Capillary: 173 mg/dL — ABNORMAL HIGH (ref 70–99)
Glucose-Capillary: 106 mg/dL — ABNORMAL HIGH (ref 70–99)

## 2020-04-24 MED ORDER — BETHANECHOL CHLORIDE 25 MG PO TABS
25.0000 mg | ORAL_TABLET | Freq: Three times a day (TID) | ORAL | Status: DC
  Administered 2020-04-24 – 2020-04-26 (×6): 25 mg via ORAL
  Filled 2020-04-24 (×6): qty 1

## 2020-04-24 NOTE — Progress Notes (Signed)
Speech Language Pathology Daily Session Note  Patient Details  Name: Jordan [name] MRN: [id number] Date of Birth: 1940/12/31  Today's Date: 04/24/2020 SLP Individual Time: 1300-1341 SLP Individual Time Calculation (min): 41 min  Short Term Goals: Week 3: SLP Short Term Goal 1 (Week 3): STG=LTG due to remaining length of stay  Skilled Therapeutic Interventions: Pt was seen for skilled ST targeting completion of education with pt and his wife, and working toward cognitive-linguistic goals. Reviewed results of MBS performed this morning and upgrade to thin liquids. Also provided verbal review and handout for compensatory swallow strategies to adhere to at home. Answered questions regarding follow up ST from wife. Pt also completed a novel semi-complex monthly calendar/scheduling task with overall extra time and Min A verbal and visual cues required for error awareness and functional problem solving. He selectively attended to tasks with Supervision A verbal cues for redirection. Pt still noted to have decreased vocal intensity, reducing his intelligibility, slightly. Discussed continuing EMST exercises to promote breath support for increased intensity at home. Pt left laying in bed with alarm set and needs within reach. Continue per current plan of care.           Pain Pain Assessment Pain Scale: Faces Faces Pain Scale: No hurt  Therapy/Group: Individual Therapy  Jordan [name] 04/24/2020, 7:18 AM

## 2020-04-24 NOTE — Discharge Summary (Signed)
Physician Discharge Summary  Patient ID: Jordan [name] MRN: [id number] DOB/AGE: Aug 20, 1940 80 y.o.  Admit date: 04/07/2020 Discharge date: 04/26/2020  Discharge Diagnoses:  Principal Problem:   Infarction of left basal ganglia (HCC) Active Problems:   Diabetes mellitus type 2 in obese [facility]) DVT prophylaxis Dysphagia AKI on CKD Permissive hypertension Hyperlipidemia CAD with stenting Left BKA 2015 Constipation BPH/urinary retention Enterococcus Staphylococcus UTI  Discharged Condition: Stable  Significant Diagnostic Studies: DG Chest 2 View  Result Date: 03/31/2020 CLINICAL DATA:  Shortness of breath EXAM: CHEST - 2 VIEW COMPARISON:  03/20/2014 FINDINGS: Shallow lung inflation. The heart size and mediastinal contours are within normal limits. Both lungs are clear. The visualized skeletal structures are unremarkable. IMPRESSION: No active cardiopulmonary disease. Electronically Signed   By: Deatra Robinson M.D.   On: 03/31/2020 00:24   DG Abd 1 View  Result Date: 04/19/2020 CLINICAL DATA:  Abdominal pain and distension EXAM: ABDOMEN - 1 VIEW COMPARISON:  04/18/2020 FINDINGS: Small amount retained contrast throughout colon from cecum to rectum. Nonobstructive bowel gas pattern. No bowel dilatation or bowel wall thickening. Reservoir in RIGHT pelvis. No acute osseous findings. IMPRESSION: Scattered mild retained contrast throughout colon without evidence of bowel obstruction or dilatation. Electronically Signed   By: Ulyses Southward M.D.   On: 04/19/2020 08:16   DG Abd 1 View  Result Date: 04/18/2020 CLINICAL DATA:  Constipation for 1 day, history of diabetes mellitus EXAM: ABDOMEN - 1 VIEW COMPARISON:  None. FINDINGS: High attenuation enteric contrast media has traversed to the level of the rectum. There is a nonspecific paucity of small bowel gas but with an air-filled appearance of the colon. Passage of contrast argues against an obstructive process. No suspicious abdominal calcifications.  Degenerative changes seen in the spine, hips and pelvis. IMPRESSION: 1. Nonspecific paucity of small bowel gas but with an air-filled appearance of the colon. Nonspecific but can be a normal finding particularly given passage of enteric contrast to the rectum which favors a nonobstructive process. Electronically Signed   By: Kreg Shropshire M.D.   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 EXAM: MRA HEAD WITHOUT CONTRAST MRA NECK WITHOUT CONTRAST TECHNIQUE: Angiographic images of the Circle of Willis were obtained using MRA technique without intravenous contrast. Angiographic images of the neck were obtained using MRA technique without intravenous contrast. Carotid stenosis measurements (when applicable) are obtained utilizing NASCET criteria, using the distal internal carotid diameter as the denominator. COMPARISON:  Brain MRI 0312 hours today. FINDINGS: MRA NECK FINDINGS 3D time-of-flight imaging in the neck. Antegrade flow signal in both cervical carotid and vertebral arteries to the skull base. Motion degraded detail of the carotid bifurcations. No hemodynamically significant stenosis on the left. And no strong evidence of hemodynamically significant stenosis of the right ICA, although the ECA origin may be stenotic. Relatively codominant vertebral arteries in the neck. No evidence of cervical vertebral artery stenosis. MRA HEAD FINDINGS Better diagnostic quality of the intracranial MRA portion. Antegrade flow in the posterior circulation with patent distal vertebral arteries. No vertebrobasilar junction stenosis. Patent basilar artery, AICA origins, SCA and PCA origins. Posterior communicating arteries are diminutive or absent. Left PCA P1 and branches are within normal limits. There is mild to moderate right PCA P2  irregularity and stenosis. Distal PCA branches are patent. Antegrade flow in both ICA  siphons. Extensive distal siphon irregularity in keeping with intracranial atherosclerosis, and high-grade right ICA stenosis is suspected in the distal petrous and proximal cavernous segment (series 5, image 45). Moderate irregularity and stenosis continues to the anterior genu. On the left there is mild to moderate irregularity and stenosis at the anterior genu. Patent carotid termini, MCA and ACA origins. Anterior communicating artery and visible ACA branches are within normal limits. MCA M1 segments and MCA bi/trifurcations are patent without stenosis. Visible bilateral MCA branches are within normal limits. IMPRESSION: 1. 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 2. Negative for large vessel occlusion. Generally mild intracranial atherosclerosis elsewhere. 3. No evidence of hemodynamically significant ICA or vertebral artery stenosis in the neck. Electronically Signed   By: Odessa Fleming M.D.   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 EXAM: MRA HEAD WITHOUT CONTRAST MRA NECK WITHOUT CONTRAST TECHNIQUE: Angiographic images of the Circle of Willis were obtained using MRA technique without intravenous contrast. Angiographic images of the neck were obtained using MRA technique without intravenous contrast. Carotid stenosis measurements (when applicable) are obtained utilizing NASCET criteria, using the distal internal carotid diameter as the denominator. COMPARISON:  Brain MRI 0312 hours today. FINDINGS: MRA NECK FINDINGS 3D time-of-flight imaging in the neck. Antegrade flow signal in both cervical carotid and vertebral arteries to the skull base. Motion degraded detail of  the carotid bifurcations. No hemodynamically significant stenosis on the left. And no strong evidence of hemodynamically significant stenosis of the right ICA, although the ECA origin may be stenotic. Relatively codominant vertebral arteries in the neck. No evidence of cervical vertebral artery stenosis. MRA HEAD FINDINGS Better diagnostic quality of the intracranial MRA portion. Antegrade flow in the posterior circulation with patent distal vertebral arteries. No vertebrobasilar junction stenosis. Patent basilar artery, AICA origins, SCA and PCA origins. Posterior communicating arteries are diminutive or absent. Left PCA P1 and branches are within normal limits. There is mild to moderate right PCA P2 irregularity and stenosis. Distal PCA branches are patent. Antegrade flow in both ICA siphons. Extensive distal siphon irregularity in keeping with intracranial atherosclerosis, and high-grade right ICA stenosis is suspected in the distal petrous and proximal cavernous segment (series 5, image 45). Moderate irregularity and stenosis continues to the anterior genu. On the left there is mild to moderate irregularity and stenosis at the anterior genu. Patent carotid termini, MCA and ACA origins. Anterior communicating artery and visible ACA branches are within normal limits. MCA M1 segments and MCA bi/trifurcations are patent without stenosis. Visible bilateral MCA branches are within normal limits. IMPRESSION: 1. 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 2. Negative for large vessel occlusion. Generally mild intracranial atherosclerosis elsewhere. 3. No evidence of hemodynamically significant ICA or vertebral artery stenosis in the neck. Electronically Signed   By: Odessa Fleming M.D.   On: 03/31/2020 09:23   MR BRAIN WO CONTRAST  Result Date: 03/31/2020 CLINICAL DATA:  Left-sided facial droop EXAM: MRI HEAD WITHOUT CONTRAST TECHNIQUE: Multiplanar,  multiecho pulse sequences of the brain and surrounding structures were obtained without intravenous contrast. COMPARISON:  None. FINDINGS: Brain: There is a small left gangliothalamic acute infarct. No acute or chronic hemorrhage. There is multifocal hyperintense T2-weighted signal within the white matter.  Generalized volume loss without a clear lobar predilection. The midline structures are normal. Vascular: Major flow voids are preserved. Skull and upper cervical spine: Normal calvarium and skull base. Visualized upper cervical spine and soft tissues are normal. Sinuses/Orbits:No paranasal sinus fluid levels or advanced mucosal thickening. No mastoid or middle ear effusion. Normal orbits. IMPRESSION: Small left gangliothalamic acute infarct. No hemorrhage or mass effect. Electronically Signed   By: Deatra Robinson M.D.   On: 03/31/2020 03:52   DG Swallowing Func-Speech Pathology  Result Date: 04/24/2020 Objective Swallowing Evaluation: Type of Study: MBS-Modified Barium Swallow Study  Patient Details Name: Jordan [name] MRN: [id number] Date of Birth: [birth year] Today's Date: 04/24/2020 Past Medical History: Past Medical History: Diagnosis Date . Anemia  . Aortic stenosis  . CAD (coronary artery disease)   a. s/p IMI in past tx with POBA and cath 6 mos later with occluded RCA;  b. h/o Taxus DES to LAD and CFX;  c.  cath 1/10: LM ok, mLAD 40-50%, LAD stent ok, D2 tandem 60-70%, pCFX 30%, mAVCFX stent ok, pRCA 40%, RV 90%, RV marginal 90%, dRCA filled L-R collats tx medically . Carotid stenosis   dopplers 5/11: 40-59% bilat . CHF (congestive heart failure) (HCC)  . Diabetic ulcer of left foot (HCC)  . DM2 (diabetes mellitus, type 2) (HCC)  . Heart murmur  . HLD (hyperlipidemia)  . HTN (hypertension)  . Myocardial infarction (HCC) 1995 and 2005  Heart Attack . Osteomyelitis of left foot (HCC)  . Staphylococcus aureus bacteremia with sepsis [facility])  Past Surgical History: Past Surgical History: Procedure Laterality Date .  ABDOMINAL AORTAGRAM N/A 04/07/2013  Procedure: ABDOMINAL [name];  Surgeon: [name], MD;  Location: [facility] CATH LAB;  Service: Cardiovascular;  Laterality: N/A; . ABDOMINAL AORTAGRAM N/A 01/26/2014  Procedure: ABDOMINAL [name];  Surgeon: [name], MD;  Location: [facility] CATH LAB;  Service: Cardiovascular;  Laterality: N/A; . ABDOMINAL AORTAGRAM N/A 06/13/2014  Procedure: ABDOMINAL [name];  Surgeon: [name], MD;  Location: [facility] CATH LAB;  Service: Cardiovascular;  Laterality: N/A; . AMPUTATION Left 01/28/2014  Procedure: AMPUTATION DIGIT- LEFT 5TH TOE;  Surgeon: [name], MD;  Location: [facility] OR;  Service: Vascular;  Laterality: Left; . AMPUTATION Left 02/02/2014  Procedure: Left 4th Ray Amputation vs Transmetatarsal Amputation;  Surgeon: [name], MD;  Location: [facility] OR;  Service: Orthopedics;  Laterality: Left; . AMPUTATION Left 03/24/2014  Procedure: AMPUTATION BELOW KNEE;  Surgeon: [name], MD;  Location: [facility] OR;  Service: Orthopedics;  Laterality: Left; . APPENDECTOMY   . CARDIAC CATHETERIZATION   . CORONARY ANGIOPLASTY WITH STENT PLACEMENT   . FEMORAL-TIBIAL BYPASS GRAFT Left 01/28/2014  Procedure:  FEMORAL-ANTERIOR TIBIAL ARTERY Bypass Graft utilizing composite gortex graft and vein graft;  Surgeon: [name], MD;  Location: [facility] OR;  Service: Vascular;  Laterality: Left; . LOWER EXTREMITY ANGIOGRAM Left 01/26/2014  Procedure: LOWER EXTREMITY ANGIOGRAM;  Surgeon: [name], MD;  Location: [facility] CATH LAB;  Service: Cardiovascular;  Laterality: Left; . RIGHT/LEFT HEART CATH AND CORONARY ANGIOGRAPHY N/A 12/02/2017  Procedure: RIGHT/LEFT HEART CATH AND CORONARY ANGIOGRAPHY;  Surgeon: Swaz[place], [name], MD;  Location: [facility] INVASIVE CV LAB;  Service: Cardiovascular;  Laterality: N/A; . TEE WITHOUT CARDIOVERSION N/A 03/28/2014  Procedure: TRANSESOPHAGEAL ECHOCARDIOGRAM (TEE);  Surgeon: Quintella Reichert, MD;  Location: [facility] ENDOSCOPY;  Service: Cardiovascular;   Laterality: N/A; HPI: Mr. Emitt Maglione is a 80 y.o. male with history of coronary artery disease, aortic stenosis, congestive heart failure, diabetes, left  BKA as a complication of diabetes, hypertension, hyperlipidemia, presented to the emergency room for evaluation of Multiple neurological symptoms with last known well sometime 03/28/2020 after multiple ER visits to [facility] with OP PCP f/u recommended. Sx included weakness w/ multiple falls, slurred speech, difficulty swallowing, L facial droop. MRI shows small left gangliothalamic infarct. Pt admitted to [facility] 04/08/19.  Assessment / Plan / Recommendation CHL IP CLINICAL IMPRESSIONS 04/24/2020 Clinical Impression -- Pt continues to present with mild oropharyngeal dysphagia, however improved airway protection since last study on 04/14/20. Pt's swallow initiation occured at the level of the pyriform sinuses with both nectar and thin barium. Intermittent flash penetration (penetrated but also fully cleared from vestibule during swallow) noted to occur with trials of thin, and trace aspiration X2 without sensation when challenged to take larger sips and via straw. However, when pt cued to take single small sips from cup, no more than flash penetration observed. When pt consumed barium pill whole in applesauce, he required one additional presentation of applesauce to aid in anterior to posterior transit of pill, and successfully clear it from his oral cavity. Recommend pt continue regular solids, upgrade to thin liquids but no straws allowed, only small single cup sips, and pills may be given whole in applesauce one at a time, large pills may need to be broken in half when able. Would also recommend extra diligence with oral care (complete prior to PO). Pt should have intermittent supervision during intake to ensure use of safe compensatory swallow strategies. ST will continue to provide skilled interventions to ensure diet safety and efficiency and and  increase independence/carryover with use of compensatory swallow strategies.  SLP Visit Diagnosis Dysphagia, oropharyngeal phase (R13.12) Attention and concentration deficit following -- Frontal lobe and executive function deficit following -- Impact on safety and function Mild aspiration risk;Moderate aspiration risk   CHL IP TREATMENT RECOMMENDATION 04/03/2020 Treatment Recommendations Therapy as outlined in treatment plan below   Prognosis 04/03/2020 Prognosis for Safe Diet Advancement Good Barriers to Reach Goals -- Barriers/Prognosis Comment -- CHL IP DIET RECOMMENDATION 04/24/2020 SLP Diet Recommendations Regular solids;Thin liquid Liquid Administration via Cup;No straw Medication Administration Whole meds with puree Compensations Slow rate;Small sips/bites;Clear throat intermittently;Multiple dry swallows after each bite/sip;Other (Comment) Postural Changes Remain semi-upright after after feeds/meals (Comment);Seated upright at 90 degrees   CHL IP OTHER RECOMMENDATIONS 04/24/2020 Recommended Consults -- Oral Care Recommendations Oral care BID;Oral care before and after PO Other Recommendations --   CHL IP FOLLOW UP RECOMMENDATIONS 04/07/2020 Follow up Recommendations Inpatient Rehab   CHL IP FREQUENCY AND DURATION 04/03/2020 Speech Therapy Frequency (ACUTE ONLY) min 2x/week Treatment Duration 1 week;2 weeks      CHL IP ORAL PHASE 04/24/2020 Oral Phase Impaired Oral - Pudding Teaspoon -- Oral - Pudding Cup -- Oral - Honey Teaspoon -- Oral - Honey Cup -- Oral - Nectar Teaspoon -- Oral - Nectar Cup -- Oral - Nectar Straw -- Oral - Thin Teaspoon -- Oral - Thin Cup Premature spillage;Decreased bolus cohesion;Lingual/palatal residue;Piecemeal swallowing Oral - Thin Straw Lingual/palatal residue;Piecemeal swallowing;Premature spillage;Decreased bolus cohesion Oral - Puree WFL Oral - Mech Soft -- Oral - Regular NT Oral - Multi-Consistency -- Oral - Pill Delayed oral transit;Decreased bolus cohesion;Reduced posterior propulsion  Oral Phase - Comment --  CHL IP PHARYNGEAL PHASE 04/24/2020 Pharyngeal Phase Impaired Pharyngeal- Pudding Teaspoon -- Pharyngeal -- Pharyngeal- Pudding Cup -- Pharyngeal -- Pharyngeal- Honey Teaspoon -- Pharyngeal -- Pharyngeal- Honey Cup NT Pharyngeal -- Pharyngeal- Nectar Teaspoon -- Pharyngeal -- Pharyngeal-  Nectar Cup Delayed swallow initiation-pyriform sinuses;Reduced airway/laryngeal closure;Penetration/Aspiration during swallow;Trace aspiration Pharyngeal Material enters airway, passes BELOW cords without attempt by patient to eject out (silent aspiration) Pharyngeal- Nectar Straw NT Pharyngeal -- Pharyngeal- Thin Teaspoon NT Pharyngeal -- Pharyngeal- Thin Cup Delayed swallow initiation-pyriform sinuses;Reduced airway/laryngeal closure;Penetration/Aspiration during swallow Pharyngeal Material enters airway, remains ABOVE vocal cords then ejected out Pharyngeal- Thin Straw Delayed swallow initiation-pyriform sinuses;Reduced airway/laryngeal closure;Penetration/Aspiration during swallow;Trace aspiration Pharyngeal Material enters airway, remains ABOVE vocal cords then ejected out;Material enters airway, passes BELOW cords without attempt by patient to eject out (silent aspiration) Pharyngeal- Puree WFL Pharyngeal -- Pharyngeal- Mechanical Soft -- Pharyngeal -- Pharyngeal- Regular -- Pharyngeal -- Pharyngeal- Multi-consistency -- Pharyngeal -- Pharyngeal- Pill WFL Pharyngeal -- Pharyngeal Comment --  CHL IP CERVICAL ESOPHAGEAL PHASE 04/24/2020 Cervical Esophageal Phase WFL Pudding Teaspoon -- Pudding Cup -- Honey Teaspoon -- Honey Cup -- Nectar Teaspoon -- Nectar Cup -- Nectar Straw -- Thin Teaspoon -- Thin Cup -- Thin Straw -- Puree -- Mechanical Soft -- Regular -- Multi-consistency -- Pill -- Cervical Esophageal Comment -- [name] 04/24/2020, 9:47 AM              DG Swallowing Func-Speech Pathology  Result Date: 04/14/2020 Objective Swallowing Evaluation: Type of Study: MBS-Modified Barium Swallow Study   Patient Details Name: Jordan [name] MRN: [id number] Date of Birth: 13-Jun-1940 Today's Date: 04/14/2020 Past Medical History: Past Medical History: Diagnosis Date . Anemia  . Aortic stenosis  . CAD (coronary artery disease)   a. s/p IMI in past tx with POBA and cath 6 mos later with occluded RCA;  b. h/o Taxus DES to LAD and CFX;  c.  cath 1/10: LM ok, mLAD 40-50%, LAD stent ok, D2 tandem 60-70%, pCFX 30%, mAVCFX stent ok, pRCA 40%, RV 90%, RV marginal 90%, dRCA filled L-R collats tx medically . Carotid stenosis   dopplers 5/11: 40-59% bilat . CHF (congestive heart failure) (HCC)  . Diabetic ulcer of left foot (HCC)  . DM2 (diabetes mellitus, type 2) (HCC)  . Heart murmur  . HLD (hyperlipidemia)  . HTN (hypertension)  . Myocardial infarction (HCC) 1995 and 2005  Heart Attack . Osteomyelitis of left foot (HCC)  . Staphylococcus aureus bacteremia with sepsis [facility])  Past Surgical History: Past Surgical History: Procedure Laterality Date . ABDOMINAL AORTAGRAM N/A 04/07/2013  Procedure: ABDOMINAL [name];  Surgeon: [name], MD;  Location: [facility] CATH LAB;  Service: Cardiovascular;  Laterality: N/A; . ABDOMINAL AORTAGRAM N/A 01/26/2014  Procedure: ABDOMINAL [name];  Surgeon: [name], MD;  Location: [facility] CATH LAB;  Service: Cardiovascular;  Laterality: N/A; . ABDOMINAL AORTAGRAM N/A 06/13/2014  Procedure: ABDOMINAL [name];  Surgeon: [name], MD;  Location: [facility] CATH LAB;  Service: Cardiovascular;  Laterality: N/A; . AMPUTATION Left 01/28/2014  Procedure: AMPUTATION DIGIT- LEFT 5TH TOE;  Surgeon: [name], MD;  Location: [facility] OR;  Service: Vascular;  Laterality: Left; . AMPUTATION Left 02/02/2014  Procedure: Left 4th Ray Amputation vs Transmetatarsal Amputation;  Surgeon: [name], MD;  Location: [facility] OR;  Service: Orthopedics;  Laterality: Left; . AMPUTATION Left 03/24/2014  Procedure: AMPUTATION BELOW KNEE;  Surgeon: [name], MD;  Location: [facility] OR;  Service: Orthopedics;   Laterality: Left; . APPENDECTOMY   . CARDIAC CATHETERIZATION   . CORONARY ANGIOPLASTY WITH STENT PLACEMENT   . FEMORAL-TIBIAL BYPASS GRAFT Left 01/28/2014  Procedure:  FEMORAL-ANTERIOR TIBIAL ARTERY Bypass Graft utilizing composite gortex graft and vein graft;  Surgeon: [name], MD;  Location:  [facility] OR;  Service: Vascular;  Laterality: Left; . LOWER EXTREMITY ANGIOGRAM Left 01/26/2014  Procedure: LOWER EXTREMITY ANGIOGRAM;  Surgeon: Iran OuchMuhammad A Arida, MD;  Location: [facility] CATH LAB;  Service: Cardiovascular;  Laterality: Left; . RIGHT/LEFT HEART CATH AND CORONARY ANGIOGRAPHY N/A 12/02/2017  Procedure: RIGHT/LEFT HEART CATH AND CORONARY ANGIOGRAPHY;  Surgeon: [name], [name], MD;  [facility];  Service: Cardiovascular;  Laterality: N/A; . TEE WITHOUT CARDIOVERSION N/A 03/28/2014  Procedure: TRANSESOPHAGEAL ECHOCARDIOGRAM (TEE);  Surgeon: [name], MD;  [facility];  Service: Cardiovascular;  Laterality: N/A; HPI: [name] is a 80 y.o. male with history of coronary artery disease, aortic stenosis, congestive heart failure, diabetes, left BKA as a complication of diabetes, hypertension, hyperlipidemia, presented to the emergency room for evaluation of Multiple neurological symptoms with last known well sometime 03/28/2020 after [facility] with OP PCP f/u recommended. Sx included weakness w/ multiple falls, slurred speech, difficulty swallowing, L facial droop. MRI shows small left gangliothalamic infarct. [facility].  Subjective: "He was coughing a lot during breakfast." Assessment / Plan / Recommendation CHL IP CLINICAL IMPRESSIONS 04/14/2020 Clinical Impression --Pt presents with mild oral and moderate pharyngeal dysphagia that is slightly improved from last MBS conducted 04/03/20. However, he still remains at moderate risk for aspiration. Thin barium was consistently penetrated to the level of the vocal folds (PAS score 5) and  silently aspirated X2 (PAS score 8) throughout study today, primarily due to a delay in swallow initiation and incomplete laryngeal vestibule closure. Chin tuck compensatory maneuver was attempted with thin barium, however it was ineffective to provide additional airway closure, and actually resulted in larger volume of aspiration in comparison to when aspirated via neutral head position. Pt's volitional cough was weak, and only partially effective to clear penetrates and aspirates. Pt also exhibited decreased cohesion of boluses, as well as intermittent premature spillage into pharynx. Although, his oral control does appear improved since last MBS. Nectar thick barium also reached as low as the level of the pyriform sinuses prior to swallow initiation, however no aspiration or penetration into was observed. Recommend pt continue current diet with intermittent supervision - regular texture solids, nectar liquids, pills whole with nectar. ST will continue to provide skilled interventions to address oral and pharyngeal deficits and work toward diet advancement while inpatient.  SLP Visit Diagnosis Dysphagia, oropharyngeal phase (R13.12) Attention and concentration deficit following -- Frontal lobe and executive function deficit following -- Impact on safety and function Moderate aspiration risk   CHL IP TREATMENT RECOMMENDATION 04/03/2020 Treatment Recommendations Therapy as outlined in treatment plan below   Prognosis 04/03/2020 Prognosis for Safe Diet Advancement Good Barriers to Reach Goals -- Barriers/Prognosis Comment -- CHL IP DIET RECOMMENDATION 04/14/2020 SLP Diet Recommendations Nectar thick liquid;Regular solids Liquid Administration via Cup;Straw Medication Administration Whole meds with liquid Compensations Slow rate;Small sips/bites;Clear throat intermittently Postural Changes Remain semi-upright after after feeds/meals (Comment)   CHL IP OTHER RECOMMENDATIONS 04/14/2020 Recommended Consults -- Oral Care  Recommendations Oral care BID Other Recommendations Order thickener from pharmacy   [facility] IP FOLLOW UP RECOMMENDATIONS 04/07/2020 Follow up Recommendations Inpatient Rehab   [facility] IP FREQUENCY AND DURATION 04/03/2020 Speech Therapy Frequency (ACUTE ONLY) min 2x/week Treatment Duration 1 week;2 weeks      [facility] IP ORAL PHASE 04/14/2020 Oral Phase Impaired Oral - Pudding Teaspoon -- Oral - Pudding Cup -- Oral - Honey Teaspoon -- Oral - Honey Cup -- Oral - Nectar Teaspoon --  Oral - Nectar Cup Decreased bolus cohesion;Lingual/palatal residue;Lingual pumping Oral - Nectar Straw NT Oral - Thin Teaspoon -- Oral - Thin Cup Decreased bolus cohesion;Premature spillage;Lingual/palatal residue Oral - Thin Straw NT Oral - Puree NT Oral - Mech Soft -- Oral - Regular NT Oral - Multi-Consistency -- Oral - Pill NT Oral Phase - Comment --  [facility]L IP PHARYNGEAL PHASE 04/14/2020 Pharyngeal Phase Impaired Pharyngeal- Pudding Teaspoon -- Pharyngeal -- Pharyngeal- Pudding Cup -- Pharyngeal -- Pharyngeal- Honey Teaspoon -- Pharyngeal -- Pharyngeal- Honey Cup NT Pharyngeal -- Pharyngeal- Nectar Teaspoon -- Pharyngeal -- Pharyngeal- Nectar Cup Delayed swallow initiation-pyriform sinuses Pharyngeal Material does not enter airway Pharyngeal- Nectar Straw NT Pharyngeal -- Pharyngeal- Thin Teaspoon NT Pharyngeal -- Pharyngeal- Thin Cup Delayed swallow initiation-pyriform sinuses;Reduced airway/laryngeal closure;Penetration/Aspiration during swallow;Compensatory strategies attempted (with notebox) Pharyngeal Material enters airway, passes BELOW cords without attempt by patient to eject out (silent aspiration);Material enters airway, CONTACTS cords and not ejected out Pharyngeal- Thin Straw NT Pharyngeal -- Pharyngeal- Puree NT Pharyngeal -- Pharyngeal- Mechanical Soft -- Pharyngeal -- Pharyngeal- Regular -- Pharyngeal -- Pharyngeal- Multi-consistency -- Pharyngeal -- Pharyngeal- Pill NT Pharyngeal -- Pharyngeal Comment --  [facility]L IP CERVICAL ESOPHAGEAL PHASE  04/14/2020 Cervical Esophageal Phase WFL Pudding Teaspoon -- Pudding Cup -- Honey Teaspoon -- Honey Cup -- Nectar Teaspoon -- Nectar Cup -- Nectar Straw -- Thin Teaspoon -- Thin Cup -- Thin Straw -- Puree -- Mechanical Soft -- Regular -- Multi-consistency -- Pill -- Cervical Esophageal Comment -- [name] 04/14/2020, 9:39 AM              ECHOCARDIOGRAM COMPLETE  Result Date: 03/31/2020    ECHOCARDIOGRAM REPORT   Patient Name:   Jordan [name] Date of Exam: 03/31/2020 Medical Rec #:  [id number]     Height:       69.0 in Accession #:    [id number]    Weight:       219.0 lb Date of Birth:  12/24/1940     BSA:          2.147 m Patient Age:    79 years      BP:           175/76 mmHg Patient Gender: M             HR:           65 bpm. Exam Location:  Inpatient Procedure: 2D Echo Indications:    stroke 434.91  History:        Patient has prior history of Echocardiogram examinations, most                 recent 03/28/2014. CAD, chronic kidney disease, Aortic Valve                 Disease; Risk Factors:Diabetes, Hypertension, Dyslipidemia and                 Sleep Apnea.  Sonographer:    Delcie Roch Referring Phys: [id number] VASUNDHRA RATHORE IMPRESSIONS  1. Left ventricular ejection fraction, by estimation, is 60 to 65%. The left ventricle has normal function. The left ventricle has no regional wall motion abnormalities. Left ventricular diastolic parameters are consistent with Grade I diastolic dysfunction (impaired relaxation).  2. Right ventricular systolic function is normal. The right ventricular size is normal.  3. Left atrial size was mildly dilated.  4. The mitral valve is normal in structure. No evidence of mitral valve regurgitation. No evidence of mitral stenosis.  5. The aortic valve  is tricuspid. There is mild calcification of the aortic valve. There is moderate thickening of the aortic valve. Aortic valve regurgitation is not visualized. Mild to moderate aortic valve sclerosis/calcification is  present, without any evidence of aortic stenosis.  6. The inferior vena cava is normal in size with greater than 50% respiratory variability, suggesting right atrial pressure of 3 mmHg. FINDINGS  Left Ventricle: Left ventricular ejection fraction, by estimation, is 60 to 65%. The left ventricle has normal function. The left ventricle has no regional wall motion abnormalities. The left ventricular internal cavity size was normal in size. There is  no left ventricular hypertrophy. Left ventricular diastolic parameters are consistent with Grade I diastolic dysfunction (impaired relaxation). Normal left ventricular filling pressure. Right Ventricle: The right ventricular size is normal. No increase in right ventricular wall thickness. Right ventricular systolic function is normal. Left Atrium: Left atrial size was mildly dilated. Right Atrium: Right atrial size was normal in size. Pericardium: There is no evidence of pericardial effusion. Mitral Valve: The mitral valve is normal in structure. There is mild thickening of the mitral valve leaflet(s). Mild mitral annular calcification. No evidence of mitral valve regurgitation. No evidence of mitral valve stenosis. Tricuspid Valve: The tricuspid valve is normal in structure. Tricuspid valve regurgitation is not demonstrated. No evidence of tricuspid stenosis. Aortic Valve: The aortic valve is tricuspid. There is mild calcification of the aortic valve. There is moderate thickening of the aortic valve. Aortic valve regurgitation is not visualized. Mild to moderate aortic valve sclerosis/calcification is present, without any evidence of aortic stenosis. Aortic valve mean gradient measures 7.7 mmHg. Aortic valve peak gradient measures 13.8 mmHg. Aortic valve area, by VTI measures 2.00 cm. Pulmonic Valve: The pulmonic valve was normal in structure. Pulmonic valve regurgitation is not visualized. No evidence of pulmonic stenosis. Aorta: The aortic root and ascending aorta are  structurally normal, with no evidence of dilitation. Venous: The inferior vena cava is normal in size with greater than 50% respiratory variability, suggesting right atrial pressure of 3 mmHg. IAS/Shunts: No atrial level shunt detected by color flow Doppler.  LEFT VENTRICLE PLAX 2D LVIDd:         4.90 cm     Diastology LVIDs:         3.70 cm     LV e' medial:    4.90 cm/s LV PW:         1.10 cm     LV E/e' medial:  11.3 LV IVS:        1.10 cm     LV e' lateral:   6.64 cm/s LVOT diam:     2.18 cm     LV E/e' lateral: 8.3 LV SV:         82 LV SV Index:   38 LVOT Area:     3.73 cm  LV Volumes (MOD) LV vol d, MOD A2C: 93.7 ml LV vol s, MOD A2C: 53.7 ml LV SV MOD A2C:     40.0 ml RIGHT VENTRICLE             IVC RV S prime:     13.60 cm/s  IVC diam: 1.40 cm TAPSE (M-mode): 1.9 cm LEFT ATRIUM             Index LA diam:        4.10 cm 1.91 cm/m LA Vol (A2C):   49.7 ml 23.15 ml/m LA Vol (A4C):   77.5 ml 36.09 ml/m LA Biplane Vol: 62.5 ml 29.11  ml/m  AORTIC VALVE AV Area (Vmax):    1.51 cm AV Area (Vmean):   1.55 cm AV Area (VTI):     2.00 cm AV Vmax:           185.96 cm/s AV Vmean:          131.924 cm/s AV VTI:            0.413 m AV Peak Grad:      13.8 mmHg AV Mean Grad:      7.7 mmHg LVOT Vmax:         75.00 cm/s LVOT Vmean:        54.900 cm/s LVOT VTI:          0.221 m LVOT/AV VTI ratio: 0.53  AORTA Ao Root diam: 3.10 cm Ao Asc diam:  3.40 cm MITRAL VALVE MV Area (PHT): 2.83 cm     SHUNTS MV Decel Time: 268 msec     Systemic VTI:  0.22 m MV E velocity: 55.20 cm/s   Systemic Diam: 2.18 cm MV A velocity: 128.00 cm/s MV E/A ratio:  0.43 Mihai Croitoru MD Electronically signed by Thurmon Fair MD Signature Date/Time: 03/31/2020/1:05:25 PM    Final     Labs:  Basic Metabolic Panel: Recent Labs  Lab 04/19/20 0647 04/21/20 0120 04/24/20 0529  NA 139  --  133*  K 3.8  --  3.5  CL 101  --  100  CO2 28  --  24  GLUCOSE 101*  --  121*  BUN 15  --  10  CREATININE 1.29* 1.25* 1.06  CALCIUM 9.1  --  8.7*     CBC: Recent Labs  Lab 04/19/20 0647 04/24/20 0529  WBC 11.6* 8.4  NEUTROABS  --  5.5  HGB 12.6* 11.5*  HCT 40.2 34.2*  MCV 87.8 85.3  PLT 446* 384    CBG: Recent Labs  Lab 04/24/20 2055 04/25/20 0547 04/25/20 1125 04/25/20 1637 04/25/20 2059  GLUCAP 106* 71 116* 131* 86   Family history.  Mother with diabetes as well as pancreatic cancer and CAD hypertension hyperlipidemia.  Sister with diabetes.  Denies any colon cancer esophageal cancer or rectal cancer  Brief HPI:   Eligha Kmetz is a 80 y.o. 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  CAD with stenting maintained on aspirin, aortic stenosis, chronic diastolic congestive heart failure, diabetes mellitus, hypertension, hyperlipidemia, CKD stage III and carotid stenosis.  Per chart review lives with spouse independent with assistive device.  His wife does assist with some basic ADLs.  He has not driven for a couple of years.  1 level home with ramped entrance.  Presented 03/31/2020 with gait abnormality slurred speech left facial droop and multiple falls.  MRI showed small left ganglia thalamic acute infarction no hemorrhage or mass-effect.  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  Negative for large vessel occlusion.  No evidence of hemodynamically significant ICA or vertebral artery stenosis in the neck.  Admission chemistries unremarkable except glucose 313 creatinine 1.43 troponin 2-15.  Echocardiogram with ejection fraction of 60 to 65% no wall motion abnormalities grade 1 diastolic dysfunction.  Maintained on aspirin and Plavix x3 weeks then Plavix alone.  Subcutaneous Lovenox for DVT prophylaxis.  Carb modified diet with nectar thick liquids.  Physical medicine rehab consult requested due to patient's decreased functional mobility slurred speech and was admitted for  a comprehensive rehab  program.   Hospital Course: Jordan [name] was admitted to rehab 04/07/2020 for inpatient therapies to consist of PT, ST and OT at least three hours five days a week. Past admission physiatrist, therapy team and rehab RN have worked together to provide customized collaborative inpatient rehab.  Pertaining to patient's left basal ganglia thalamic infarction remained stable plan is for aspirin and Plavix x3 weeks total and Plavix alone.  Subcutaneous Lovenox for DVT prophylaxis.  Patient would follow-up neurology services.  Mood stabilization with Lexapro patient was attending full therapies.  Dysphagia currently maintained on a carb modified diet and advanced to  thin liquids.  Blood sugars overall controlled hemoglobin A1c 8.5 and insulin therapy as directed. Patient will need follow-up outpatient with PCP to consider transition to p.o. diabetic agents. Permissive hypertension controlled on Norvasc as well as Toprol with Imdur 30 mg daily no chest pain or shortness of breath. Patient will need follow-up outpatient monitoring to adjust antihypertensive medications as needed. Zetia/Crestor for hyperlipidemia.  BPH/urinary retention placed on Flomax as well as Urecholine no dysuria or hematuria.  Patient was in need of in the out catheterizations and wife was not able to perform thus a Foley catheter tube was placed and referral made outpatient for urology services.  Urine culture did show grade 100,000 Enterococcus Staphylococcus completing course of antibiotic therapy with Augmentin.  AKI on CKD stage III baseline creatinine 1.1-1.4 and follow-up outpatient.   Blood pressures were monitored on TID basis and controlled  Diabetes has been monitored with ac/hs CBG checks and SSI was use prn for tighter BS control.    Rehab course: During patient's stay in rehab weekly team conferences were held to monitor patient's progress, set goals and discuss barriers to discharge. At admission, patient required minimal  assist 20 feet rolling walker minimal assist stand pivot transfers.  Max assist grooming max assist lower body dressing minimal assist toilet transfers  Physical exam.  Blood pressure 169/89 pulse 109 temperature 97.8 respirations 20 oxygen saturation 96% room air Constitutional.  No acute distress HEENT Head.  Normocephalic and atraumatic Eyes.  Pupils round and reactive to light no discharge without nystagmus Neck.  Supple nontender no JVD without thyromegaly Cardiac regular rate rhythm no extra sounds or murmur heard Abdomen.  Soft nontender positive bowel sounds not rebound Respiratory effort normal no respiratory distress without wheeze Skin.  Left BKA site well-healed Neurologic.  Patient alert appears a bit fatigued.  Mild dysarthria but intelligible.  Mild right central 7.  Oriented x3 and follows commands.  Reasonable insight and awareness. Right upper extremity 4 -/5 proximal and distal with PD.  Left upper extremity 5/5.  Right lower extremity 4/5 proximal distal.  Left lower extremity 4+ to 5/5.  Since his pain and light touch in all fours.   He/She  has had improvement in activity tolerance, balance, postural control as well as ability to compensate for deficits. He/She has had improvement in functional use RUE/LUE  and RLE/LLE as well as improvement in awareness assisted patient with donning pants at bed level with max assist.  Supine to sit with max assist for lower extremity management.  Assisted patient with donning left lower extremity prosthesis while seated edge of bed.  Sit to stand with moderate assist rolling walker stand pivot transfers to wheelchair with rolling walker moderate assist.  Ambulates 10 feet x 2 rolling walker moderate assist.  Side-lying to sit edge of bed with moderate assist.  Donned right shoe with total  assist.  Completed various dynamic standing activities involving placing and retrieving targets from head and knee height on right side overall contact-guard  assist.  Patient requires minimal assist for mildly complex tasks due to cognitive impairments impacting his emergent awareness selective attention processing speed short-term memory and problem-solving abilities.  His speech intelligibility is also slightly due diminished vocal intensity and somewhat hoarse in vocal quality.  Full issues in regards to patient's dysphagia discussed with patient and family.  Full family teaching completed plan discharged home       Disposition: Discharged to home    Diet: Carb modified diet with thin liquids  Special Instructions: No driving smoking or alcohol  Routine Foley catheter care with ambulatory referral obtained with urology services to address urinary retention  Medications at discharge 1.  Tylenol as needed 2.  Norvasc 5 mg p.o. daily 3.  Augmentin 875-125 1 tablet every 12 hours x7 days 4.  Plavix 75 mg p.o. daily 5.  Lexapro 10 mg p.o. daily 6.  Zetia 10 mg p.o. nightly 7.  Lantus insulin 30 units twice daily 8.  Imdur 30 mg p.o. daily 9.  Toprol-XL 25 mg p.o. daily 10.  MiraLAX twice daily hold for loose stools 11.  Crestor 40 mg p.o. nightly 12.  Senokot S2 tabs p.o. 3 times daily decrease for loose stools 13.  Flomax 0.4 mg p.o. daily 14.  Vitamin B12 1000 mcg p.o. daily 15. Urecholine 25 mg 3 times daily with taper 16. Lasix 40 mg daily as needed edema lower extremities as prior to admission.  30-35 minutes were spent completing discharge summary and discharge planning  Discharge Instructions    Ambulatory referral to Neurology   Complete by: As directed    An appointment is requested in approximately 4 weeks left basal ganglia thalamic infarction   Ambulatory referral to Physical Medicine Rehab   Complete by: As directed    Moderate complexity follow-up 1 to 2 weeks left basal ganglia thalamic infarction       Follow-up Information    Kirsteins, Victorino Sparrow, MD Follow up.   Specialty: Physical Medicine and  Rehabilitation Why: Office to call for appointment Contact information: [address] [place] [address] [place] [address] [phone]               Signed: Charlton Amor 04/26/2020, 4:42 AM

## 2020-04-24 NOTE — Progress Notes (Signed)
Physical Therapy Weekly Progress Note  Patient Details  Name: Jordan [name] MRN: [id number] Date of Birth: Mar 03, 1941  Beginning of progress report period: April 15, 2020 End of progress report period: April 24, 2020   Patient has met 1 of 4 short term goals.  Patient continues to fluctuate in level of assist required for all functional mobility. This depends greatly on how alert he is. At times, patient with difficulty maintaining enough arousal to participate in meaningful therapy. He requires grossly MinA/ModA for all functional mobility at this time.   Patient continues to demonstrate the following deficits muscle weakness, decreased cardiorespiratoy endurance, abnormal tone, unbalanced muscle activation, motor apraxia, decreased coordination and decreased motor planning, decreased attention to right and decreased motor planning, decreased initiation, decreased attention, decreased awareness, decreased problem solving, decreased safety awareness, decreased memory and delayed processing and decreased sitting balance, decreased standing balance, decreased postural control, hemiplegia and decreased balance strategies and therefore will continue to benefit from skilled PT intervention to increase functional independence with mobility.  Patient not progressing toward long term goals.  See goal revision..  Continue plan of care.  PT Short Term Goals Week 2:  PT Short Term Goal 1 (Week 2): Pt will perform supine<>sit with min assist PT Short Term Goal 1 - Progress (Week 2): Met PT Short Term Goal 2 (Week 2): Pt will perform sit<>stands using LRAD with min assist PT Short Term Goal 2 - Progress (Week 2): Progressing toward goal PT Short Term Goal 3 (Week 2): Pt will perform bed<>chair transfers using LRAD with min assist PT Short Term Goal 3 - Progress (Week 2): Progressing toward goal PT Short Term Goal 4 (Week 2): Pt will ambulate at least 86f using LRAD with min assist PT Short Term Goal 4  - Progress (Week 2): Progressing toward goal Week 3:  PT Short Term Goal 1 (Week 3): STG = LTG based on ELOS    Therapy Documentation Precautions:  Precautions Precautions: Fall Precaution Comments: L prosthetic Required Braces or Orthoses: Other Brace Other Brace: L prosthetic Restrictions Weight Bearing Restrictions: No   Therapy/Group: Individual Therapy  JDebbora Dus1/24/2022, 7:36 AM

## 2020-04-24 NOTE — Progress Notes (Signed)
Modified Barium Swallow Progress Note  Patient Details  Name: Jordan [name] MRN: [id number] Date of Birth: 21-Apr-1940  Today's Date: 04/24/2020  Modified Barium Swallow completed.  Full report located under Chart Review in the Imaging Section.  Brief recommendations include the following:  Clinical Impression   Pt continues to present with mild oropharyngeal dysphagia, however improved airway protection since last study on 04/14/20. Pt's swallow initiation occured at the level of the pyriform sinuses with both nectar and thin barium. Intermittent flash penetration (penetrated but also fully cleared from vestibule during swallow) noted to occur with trials of thin, and trace aspiration X2 without sensation when challenged to take larger sips and via straw. However, when pt cued to take single small sips from cup, no more than flash penetration observed. When pt consumed barium pill whole in applesauce, he required one additional presentation of applesauce to aid in anterior to posterior transit of pill, and successfully clear it from his oral cavity. Recommend pt continue regular solids, upgrade to thin liquids but no straws allowed, only small single cup sips, and pills may be given whole in applesauce one at a time, large pills may need to be broken in half when able. Would also recommend extra diligence with oral care (complete prior to PO). Pt should have intermittent supervision during intake to ensure use of safe compensatory swallow strategies. ST will continue to provide skilled interventions to ensure diet safety and efficiency and and increase independence/carryover with use of compensatory swallow strategies.    Swallow Evaluation Recommendations       SLP Diet Recommendations: Regular solids;Thin liquid   Liquid Administration via: Cup;No straw   Medication Administration: Whole meds with puree   Supervision: Patient able to self feed;Intermittent supervision to cue for compensatory  strategies   Compensations: Slow rate;Small sips/bites;Clear throat intermittently;Multiple dry swallows after each bite/sip;Other (Comment) (NO STRAW, Small single cup sips)   Postural Changes: Remain semi-upright after after feeds/meals (Comment);Seated upright at 90 degrees   Oral Care Recommendations: Oral care BID;Oral care before and after PO        [name] 04/24/2020,9:45 AM

## 2020-04-24 NOTE — Progress Notes (Signed)
Occupational Therapy Session Note  Patient Details  Name: Jordan [name] MRN: [id number] Date of Birth: 1940-04-22  Today's Date: 04/24/2020 OT Individual Time: 9758-8325 OT Individual Time Calculation (min): 57 min    Short Term Goals: Week 2:  OT Short Term Goal 1 (Week 2): Pt will complete toilet transfer with min guard A. OT Short Term Goal 2 (Week 2): Pt will complete toileting with min assist. OT Short Term Goal 3 (Week 2): Pt will complete standing grooming for ~2 min with min A.  Skilled Therapeutic Interventions/Progress Updates:    Pt received semi-reclined in bed with wife and RN present, agreeable to therapy. C/o of back pain, RN administering rx at start of session. Session focus on family edu with wife.  Func Mobility: Pt completed bed mobility as follows: Side roll > R with min A to place LUE on bed rail. Side lying > sit EOB with mod A to lift trunk + bring BLE off bed. STS throughout session with RW + mod A for initial lift. Wife able to facilitate STS from bed with min A from therapist for initial lift. Amb <> bathroom with CGA + RW and completed TTB transfer with mod A for initial lift + RW + use of R grab bar. Req max VCs to sequence transfer 2/2 delayed processing/initiation of transfer. At end of session, complete sitting EOB > supine with max A to bring BLE on bed and to adjust trunk in bed.  Bathing: [place] UB/LB with LH sponge + min A to reach buttocks/for thoroughness. Discuss bathroom set-up, wife plans to have front horizontal grab bar installed in addition to L vertical bar.  Dressing: Doffs/donns R shoe/sock with total A. Donns/doffs LLE prosthetic with min A to position + press release button. Pt with noted delayed initiation of task and does not verbalize asking for help when needed. Donns/doffs shirt seated EOB with close S. Doffed brief with total A. Donned new brief and pants at bed level with max A to thread through BLE + pull over B hips.   Wife with no  additional questions regarding OT at this time. Provided education re: ADLs, bed mobility, use of gait belt, room level functional mobility, TTB transfers, and STS in prep for LB dressing.  Pt left semi-reclined in bed with bed alarm engaged, call bell in reach, wife present, and all immediate needs met.    Therapy Documentation Precautions:  Precautions Precautions: Fall Precaution Comments: L prosthetic Required Braces or Orthoses: Other Brace Other Brace: L prosthetic Restrictions Weight Bearing Restrictions: No Pain:see session note ADL: See Care Tool for more details.  Therapy/Group: Individual Therapy  Volanda Napoleon MS, OTR/L  04/24/2020, 10:39 AM

## 2020-04-24 NOTE — Progress Notes (Signed)
Patient ID: Jordan [name], male   DOB: Aug 11, 1940, 80 y.o.   MRN: [id number]   [facility] order faxed to [facility], per patient/spouse request.  Lavera Guise, BSW [phone]

## 2020-04-24 NOTE — Progress Notes (Addendum)
[facility] PHYSICAL MEDICINE & REHABILITATION PROGRESS NOTE   Subjective/Complaints: No issues overnight  Passed MBS- now on reg diet , thin , liq  Per wife has been approved for electric WC, process started 19m ago, should get in 2-6 wks ROS. Impaired due to cognition  Objective:   No results found. Recent Labs    04/24/20 0529  WBC 8.4  HGB 11.5*  HCT 34.2*  PLT 384   Recent Labs    04/24/20 0529  NA 133*  K 3.5  CL 100  CO2 24  GLUCOSE 121*  BUN 10  CREATININE 1.06  CALCIUM 8.7*    Intake/Output Summary (Last 24 hours) at 04/24/2020 5597 Last data filed at 04/23/2020 1850 Gross per 24 hour  Intake 200 ml  Output --  Net 200 ml        Physical Exam: Vital Signs Blood pressure (!) 146/72, pulse 78, temperature 98 F (36.7 C), temperature source Axillary, resp. rate 18, height 5\' 9"  (1.753 m), weight 91.3 kg, SpO2 99 %.   General: No acute distress Mood and affect are appropriate Heart: Regular rate and rhythm no rubs murmurs or extra sounds Lungs: Clear to auscultation, breathing unlabored, no rales or wheezes Abdomen: Positive bowel sounds, soft nontender to palpation, nondistended Extremities: No clubbing, cyanosis, or edema Skin: No evidence of breakdown, no evidence of rash    Neuro: Awake, aphasic, exp>recept Right central 7 with dysarthria ongoing.  Sensory exam is normal. Reflexes are 2+ in all 4's. No tremors. RUE 4-/5 prox to distal with PD. RLE 4/5--motor exam unchanged today. 5/5 LUE and LLE.   Musculoskeletal: no pain with Right hip ROM or with AROM, no pain to palp over greater troch       Assessment/Plan: 1. Functional deficits which require 3+ hours per day of interdisciplinary therapy in a comprehensive inpatient rehab setting.  Physiatrist is providing close team supervision and 24 hour management of active medical problems listed below.  Physiatrist and rehab team continue to assess barriers to discharge/monitor patient progress  toward functional and medical goals  Care Tool:  Bathing    Body parts bathed by patient: Right arm,Right upper leg,Left arm,Left upper leg,Chest,Right lower leg,Abdomen,Front perineal area,Face   Body parts bathed by helper: Buttocks Body parts n/a: Left lower leg   Bathing assist Assist Level: Minimal Assistance - Patient > 75%     Upper Body Dressing/Undressing Upper body dressing   What is the patient wearing?: Pull over shirt    Upper body assist Assist Level: Supervision/Verbal cueing    Lower Body Dressing/Undressing Lower body dressing      What is the patient wearing?: Pants     Lower body assist Assist for lower body dressing: Moderate Assistance - Patient 50 - 74%     Toileting Toileting    Toileting assist Assist for toileting: Moderate Assistance - Patient 50 - 74%     Transfers Chair/bed transfer  Transfers assist     Chair/bed transfer assist level: Moderate Assistance - Patient 50 - 74% Chair/bed transfer assistive device:   Ambulation assist      Assist level: Moderate Assistance - Patient 50 - 74% Assistive device: Walker-rolling Max distance: 10'   Walk 10 feet activity   Assist     Assist level: Moderate Assistance - Patient - 50 - 74% Assistive device: Walker-rolling   Walk 50 feet activity   Assist Walk 50 feet with 2 turns activity did not occur: Safety/medical  concerns  Assist level: Moderate Assistance - Patient - 50 - 74% Assistive device: Walker-rolling    Walk 150 feet activity   Assist Walk 150 feet activity did not occur: Safety/medical concerns         Walk 10 feet on uneven surface  activity   Assist Walk 10 feet on uneven surfaces activity did not occur: Safety/medical concerns         Wheelchair     Assist Will patient use wheelchair at discharge?: Yes Type of Wheelchair: Power    Wheelchair assist level: Supervision/Verbal cueing,Contact  Guard/Touching assist Max wheelchair distance: 30'    Wheelchair 50 feet with 2 turns activity    Assist        Assist Level: Supervision/Verbal cueing,Contact Guard/Touching assist   Wheelchair 150 feet activity     Assist      Assist Level: Supervision/Verbal cueing,Contact Guard/Touching assist   Blood pressure (!) 146/72, pulse 78, temperature 98 F (36.7 C), temperature source Axillary, resp. rate 18, height 5\' 9"  (1.753 m), weight 91.3 kg, SpO2 99 %.  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 Antithrombotics: -DVT/anticoagulation:Lovenox -antiplatelet therapy: Aspirin 81 mg daily and Plavix 75 mg daily x3 weeks then Plavix alone 3. Pain Management:Tylenol as needed- ? R hip pain , cannot reproduce PT will monitor while pt in therapy  4. Mood:Resume Lexapro 10 mg daily -antipsychotic agents: N/A 5. Neuropsych: This patientiscapable of making decisions on hisown behalf. 6. Skin/Wound Care:Routine skin checks 7. Fluids/Electrolytes/Nutrition:Routine in and outs with follow-up chemistries 8. Dysphagia. Carb modified nectar thick liquid.   Upgrade to reg diet , thin liq, d/c nocturnal IVF 9. Diabetes mellitus with obesity,peripheral neuropathy. Hemoglobin A1c 8.5.   -Home regimen:NovoLog 12units 3 times daily, Lantus insulin 40units twice daily.  -1/9 sugars remain elevated     CBG (last 3)  Recent Labs    04/23/20 1629 04/23/20 2057 04/24/20 0612  GLUCAP 137* 150* 119*   Improve after increase of Lantus to 30U.On Victoza at home   122- BGs 103-202 in last 24 hours- ~1/2 elevated; 1/2 are not- con't regimen since just increased lantus 10. AKI on CKD stage III. Baseline creatinine 1.1-1.4. Cr normal at  1.12 on 1.14, repeat weekly 11. Permissive hypertension. Presently on Toprol-XL 25 mg daily. Patient on Norvasc 10 mg nightly, Cozaar 100 mg daily, Imdur 30 mg nightly prior to admission.   Vitals:   04/23/20 1924 04/24/20 0329  BP: 137/67 (!) 146/72  Pulse: 81 78  Resp: 19 18  Temp: 98.4 F (36.9 C) 98 F (36.7 C)  SpO2: 98% 99%   cont. amlodipine 10mg  12. Hyperlipidemia. Continue Zetia/Crestor 13. CAD with stenting. Continue aspirin and Plavix 14. 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  Constipation senna S 2 po TID,  increase sorbitol Urinary retention may be associated with UTI although would suspect that his age some degree of prostatic hypertrophy, will trial Flomax, continue Keflex.  1/22- will change ABX- Keflex not susceptible  is growing Staph Aureus AND enterococcus- per pharmacy will start Augmentin  (not allergic to PCN)-   - con't I/o caths with flomax- hopefully will be able to void.  16. Bowel incontinence  1/22- likely due to stroke-if doesn't improve, and pt cannot tell,  could benefit from bowel program if hasn't improved by 30+ days.      LOS: 17 days A FACE TO FACE EVALUATION WAS PERFORMED  Erick Colace 04/24/2020, 9:06 AM

## 2020-04-24 NOTE — Progress Notes (Signed)
Physical Therapy Session Note  Patient Details  Name: Jordan [name] MRN: [id number] Date of Birth: 1941/03/23  Today's Date: 04/24/2020 PT Individual Time: 1135-1200; 1400-1455 PT Individual Time Calculation (min): 25 min and 55 mins  Short Term Goals: Week 2:  PT Short Term Goal 1 (Week 2): Pt will perform supine<>sit with min assist PT Short Term Goal 2 (Week 2): Pt will perform sit<>stands using LRAD with min assist PT Short Term Goal 3 (Week 2): Pt will perform bed<>chair transfers using LRAD with min assist PT Short Term Goal 4 (Week 2): Pt will ambulate at least 49ft using LRAD with min assist  Skilled Therapeutic Interventions/Progress Updates:    Session 1: Patient received supine in bed, wife at bedside, agreeable to family education. He reports mild low back pain, premedicated. Wife asking about PT recs regarding powerwc. This PT explained to patient and wife that due to patients stroke and subsequent cognitive changes- he is not safe to use powerwc at time of dc. Pt remains adamant that he wants to use powerwc at time of dc despite education. Wife providing PT with paperwork from therapist who started powerwc evaluation and process, however, no wc specifications were noted in this paperwork. PT explaining to wife and patient that even with practice in home setting, patient may need new modifications to power wc due to health status change since initial wc evaluation. Wife verbalized understanding, patient did not. PT recommending that patient dc home with standard wc at this time for safety. Social work present for portions of this session as well. At end of session patient resting in bed, bed alarm on, call light within reach, wife at bedside.    Session 2: Patient received supine in bed, asleep, initially difficult to rouse with wife at bedside. Eventually agreeable to PT. He denies pain. Patient able to come sit edge of bed with ModA and max verbal cues. Wife demonstrating difficulty  with this initially. PT explaining to wife to place 1 hand on patients superior hip and 1 hand under patients inferior shoulder blade to assist up to sitting- wife initially trying to pull on patients superior arm to assist up. He required MinA to stand up from elevated bed (per wife- the height of their bed at home) and MinA to complete stand pivot transfer to powerwc. ModA to reposition for proper alignment within wc. Discussed proper positioning in wc with wife. Patient able to turn on wc, but immediately hitting hospital bed and nearly wife with little awareness of this. Max verbal cues needed for patient to negotiate out of his hospital room. Once in the open hallway, patient was much more competent. In mock apt- patient with significant difficulty negotiating through kitchen and bedroom set up- frequently hitting furniture/walls with little awareness. Patient also delayed in responding to PTs cues to avoid hitting these obstacles. Wife present to observe patient in powerwc as well. He was able to complete sit <> stand from wc x2 with wife, ModA needed. Once back in the room, patient required 2nd assist to stand up from powerwc completely prior to transferring to bed. Discussed these findings with wife. Wife stating that they still hadn't made a decision on whether they will continue with powerwc once home or not. Patient returning supine with CGA. Bed alarm on, call light within reach, wife at bedside.   Therapy Documentation Precautions:  Precautions Precautions: Fall Precaution Comments: L prosthetic Required Braces or Orthoses: Other Brace Other Brace: L prosthetic Restrictions Weight Bearing Restrictions: No  Therapy/Group: Individual Therapy  Elizebeth Koller, PT, DPT, CBIS  04/24/2020, 7:35 AM

## 2020-04-25 ENCOUNTER — Inpatient Hospital Stay (HOSPITAL_COMMUNITY): Payer: Medicare PPO | Admitting: Occupational Therapy

## 2020-04-25 ENCOUNTER — Inpatient Hospital Stay (HOSPITAL_COMMUNITY): Payer: Medicare PPO | Admitting: Physical Therapy

## 2020-04-25 ENCOUNTER — Inpatient Hospital Stay (HOSPITAL_COMMUNITY): Payer: Medicare PPO | Admitting: Speech Pathology

## 2020-04-25 LAB — GLUCOSE, CAPILLARY
Glucose-Capillary: 71 mg/dL (ref 70–99)
Glucose-Capillary: 116 mg/dL — ABNORMAL HIGH (ref 70–99)
Glucose-Capillary: 131 mg/dL — ABNORMAL HIGH (ref 70–99)
Glucose-Capillary: 86 mg/dL (ref 70–99)

## 2020-04-25 MED ORDER — EZETIMIBE 10 MG PO TABS: 10.0000 mg | ORAL_TABLET | Freq: Every day | ORAL | 3 refills | Status: AC

## 2020-04-25 MED ORDER — CYANOCOBALAMIN 1000 MCG PO TABS: 1000.0000 ug | ORAL_TABLET | Freq: Every day | ORAL | 0 refills | Status: AC

## 2020-04-25 MED ORDER — POLYETHYLENE GLYCOL 3350 17 G PO PACK: 17.0000 g | PACK | Freq: Two times a day (BID) | ORAL | 0 refills | Status: AC

## 2020-04-25 MED ORDER — TAMSULOSIN HCL 0.4 MG PO CAPS: 0.4000 mg | ORAL_CAPSULE | Freq: Every day | ORAL | 0 refills | Status: AC

## 2020-04-25 MED ORDER — AMOXICILLIN-POT CLAVULANATE 875-125 MG PO TABS
1.0000 | ORAL_TABLET | Freq: Two times a day (BID) | ORAL | 0 refills | Status: AC
Start: 2020-04-25 — End: ?

## 2020-04-25 MED ORDER — METOPROLOL SUCCINATE ER 25 MG PO TB24: 25.0000 mg | ORAL_TABLET | Freq: Every day | ORAL | 0 refills | Status: AC

## 2020-04-25 MED ORDER — ISOSORBIDE MONONITRATE ER 30 MG PO TB24: 30.0000 mg | ORAL_TABLET | Freq: Every day | ORAL | 0 refills | Status: AC

## 2020-04-25 MED ORDER — ROSUVASTATIN CALCIUM 40 MG PO TABS: 40.0000 mg | ORAL_TABLET | Freq: Every day | ORAL | 3 refills | Status: AC

## 2020-04-25 MED ORDER — BETHANECHOL CHLORIDE 25 MG PO TABS: 25.0000 mg | ORAL_TABLET | Freq: Three times a day (TID) | ORAL | 0 refills | Status: AC

## 2020-04-25 MED ORDER — INSULIN GLARGINE 100 UNIT/ML SOLOSTAR PEN: 30.0000 [IU] | PEN_INJECTOR | Freq: Two times a day (BID) | SUBCUTANEOUS | 11 refills | Status: AC

## 2020-04-25 MED ORDER — AMLODIPINE BESYLATE 5 MG PO TABS: 5.0000 mg | ORAL_TABLET | Freq: Every day | ORAL | 0 refills | Status: AC

## 2020-04-25 MED ORDER — ACETAMINOPHEN 325 MG PO TABS: 650.0000 mg | ORAL_TABLET | ORAL | Status: AC | PRN

## 2020-04-25 MED ORDER — ESCITALOPRAM OXALATE 10 MG PO TABS: 10.0000 mg | ORAL_TABLET | Freq: Every day | ORAL | 0 refills | Status: AC

## 2020-04-25 MED ORDER — CLOPIDOGREL BISULFATE 75 MG PO TABS: 75.0000 mg | ORAL_TABLET | Freq: Every day | ORAL | 1 refills | Status: DC

## 2020-04-25 NOTE — Progress Notes (Signed)
Patient ID: Jordan [name], male   DOB: 1940/04/10, 80 y.o.   MRN: [id number]   Nursing education scheduled tomorrow, 04/26/2020 9-10 AM.  Lavera Guise, BSW [phone]

## 2020-04-25 NOTE — Progress Notes (Signed)
Physical Therapy Discharge Summary  Patient Details  Name: Jordan [name] MRN: [id number] Date of Birth: 1940/09/29  Today's Date: 04/25/2020 PT Individual Time: 1194-1740 PT Individual Time Calculation (min): 68 min    Patient has met 4 of 8 long term goals due to improved activity tolerance, improved balance, improved postural control, increased strength, ability to compensate for deficits, functional use of  right upper extremity and right lower extremity, improved attention and improved awareness.  Patient to discharge at a wheelchair level requiring [place] for transfers using RW. Patient's care partner attended hands-on training/education and is independent to provide the necessary physical and cognitive assistance at discharge.  Reasons goals not met: Patient continues to require min assist for bed mobility using a bedrail. Pt continues to require min/mod assist for dynamic standing balance depending on challenge of the task. Pt requires min to mod assist for household ambulation due to poor AD management when turning.  Recommendation:  Patient will benefit from ongoing skilled PT services in home health setting to continue to advance safe functional mobility, address ongoing impairments in standing balance, gait training with LRAD, wheelchair mobility, transfers, activity tolerance, B LE strengthening, and minimize fall risk.  Equipment: Patient was in process of receiving power wheelchair prior to CIR and despite pt attempting power w/c mobility during CIR stay and learning it is not safe, pt adamant that he wants to use his power wheelchair at home. (Pt demonstrating impaired awareness of deficits from CVA). Discussed this with pt's wife and SW who are attempting to arrange patient a loaner manual wheelchair until pt can have further PT to determine if power w/c will be a safe means of functional mobility in the future. Otherwise, pt has all necessary DME.   Reasons for discharge:  treatment goals met and discharge from hospital  Patient/family agrees with progress made and goals achieved: Yes   Skilled Therapeutic Interventions/Progress Updates:  Pt received asleep, supine in bed and upon awakening agreeable to therapy session. Pt remains lethargic throughout session with very delayed processing times. Pt reports his wife is not coming in for additional training/education today. Pt's wife called and therapist discussed D/C plan - pt's wife states the [place] is planning to provide pt a loaner manual w/c until he is able to safely control/navigate power wheelchair as pt is adamant that he wants the power w/c and it is scheduled to be delivered to his home today - discussed that pt needs the manual wheelchair for safety and wife verbalizes understanding and is in agreement. Wife reports feeling comfortable/confident assisting pt with functional mobility as learned during hands-on training - states her son is available to assist in the evenings as needed for ADLs as well and will plan to assist pt with getting in/out of home. Discussed car transfer with wife confirming they have a small SUV. Supine>sitting R EOB, using bedrail per home set-up, with min assist and requiring significantly increased time due to slow movements with delayed initiation. Pt moving very slow throughout entire session. Sitting EOB donned L LE prosthetic with set-up assist and R shoe max assist for time management. Stand pivot EOB>w/c using RW min assist to lift into standing and CGA/min assist for balance while turning - max cuing to keep AD close and turn fully prior to initiating sit.  Transported to/from gym in w/c for time management and energy conservation. Therapist demonstrated proper ambulatory car transfer technique using RW. Pt performed ~3f ambulatory car transfer using RW with  min assist for balance and AD management due to pt pushing RW too far forward then min assist for  placing LEs in/out of the vehicle. Ambulated ~63f up/down ramp using RW with progressively increasing mod assist for AD management (pt continues to lean too far forward and push RW forward outside his BOS) and for balance with pt lacking R/L weight shift onto stance limb resulting in very short, shuffled steps - cuing for improvement and especially during turns to increase safety with AD. Pt reports significant fatigue after this stating "that was a big step" vitals assessed: BP 136/65 (MAP 86), HR 80bpm, SpO2 100%  Participated in TUG requiring 158m8sec then 1m87m2sec - requires at least 20 seconds to come to stand from the chair each time due to significantly slow movements with CGA for safety  - requires CGA/min assist for balance/AD during gait. Transported back to room and with encouragement agreeable to remain sitting up in w/c - left with needs in reach and seat belt alarm on.  PT Discharge Precautions/Restrictions Precautions Precautions: Fall Precaution Comments: L prosthetic Required Braces or Orthoses: Other Brace Other Brace: L prosthetic Restrictions Weight Bearing Restrictions: No Pain Pain Assessment Pain Scale: 0-10 Pain Score: 4  Pain Type: Acute pain Pain Location: Back Pain Orientation: Lower;Medial Pain Descriptors / Indicators: Sore Pain Onset: On-going Pain Intervention(s): Medication (See eMAR);Repositioned;Other (Comment);Rest (pt reports he believes it is associated with increased time in the bed) Perception  Perception: Within Functional Limits Inattention/Neglect: Appears intact Spatial Orientation: impaired midline orientation with R lateral weight shift/lean Praxis Praxis: Impaired Praxis Impairment Details: Motor planning;Initiation  Cognition  Overall Cognitive Status: Impaired/Different from baseline Arousal/Alertness: Lethargic Orientation Level: Oriented X4 Attention: Sustained Sustained Attention: Appears intact (when not lethargic) Selective  Attention: Impaired Memory: Impaired Awareness: Impaired Problem Solving: Impaired Initiating: Impaired (during mobility) Self Monitoring: Impaired (during mobility) Safety/Judgment: Impaired Comments: Pt has decreased awareness of impact of current physical and cognitive deficits on safety at home and with use of power wheelchair Sensation Sensation Light Touch: Impaired Detail Peripheral sensation comments: peripherial neuropathy in BLE in distal RLE (below knee) and near amputation on the LLE. Pt reports tingling in bilateral hands Light Touch Impaired Details: Impaired LLE;Impaired RLE Hot/Cold: Not tested Proprioception: Appears Intact Stereognosis: Not tested Coordination Gross Motor Movements are Fluid and Coordinated: No Coordination and Movement Description: decreased speed of movements in all 4 extermities with delayed initiation, impaired midline orientation Motor  Motor Motor: Motor apraxia;Hemiplegia Motor - Discharge Observations: impaired motor planning with delayed initiation during functional mobility, impaired midline orientation  Mobility Bed Mobility Bed Mobility: Supine to Sit;Sit to Supine Supine to Sit: Minimal Assistance - Patient > 75% (HOB flat but using bedrail) Sit to Supine: Minimal Assistance - Patient > 75% Transfers Transfers: Sit to Stand;Stand to Sit;Stand Pivot Transfers Sit to Stand: Minimal Assistance - Patient > 75%;Contact Guard/Touching assist Stand to Sit: Minimal Assistance - Patient > 75%;Contact Guard/Touching assist Stand Pivot Transfers: Minimal Assistance - Patient > 75%;Contact Guard/Touching assist Stand Pivot Transfer Details: Verbal cues for safe use of DME/AE;Verbal cues for precautions/safety;Verbal cues for sequencing;Verbal cues for technique;Tactile cues for sequencing;Tactile cues for initiation;Tactile cues for weight shifting;Visual cues/gestures for sequencing Transfer (Assistive device): Rolling walker  (armrests) Locomotion  Gait Ambulation: Yes Gait Assistance: Minimal Assistance - Patient > 75%;Moderate Assistance - Patient 50-74% Gait Distance (Feet): 50 Feet Assistive device: Rolling walker Gait Assistance Details: Tactile cues for weight shifting;Verbal cues for safe use of DME/AE;Verbal cues for technique;Verbal cues for sequencing;Verbal cues  for gait pattern;Verbal cues for precautions/safety;Visual cues/gestures for precautions/safety;Visual cues/gestures for sequencing;Visual cues for safe use of DME/AE;Tactile cues for sequencing;Tactile cues for posture Gait Gait: Yes Gait Pattern: Impaired Gait Pattern: Step-to pattern;Decreased stance time - left;Lateral trunk lean to right;Shuffle;Poor foot clearance - left;Poor foot clearance - right Gait velocity: decreased Stairs / Additional Locomotion Stairs: No Ramp: Moderate Assistance - Patient 50 - 74% (using RW) Wheelchair Mobility Wheelchair Mobility: Yes (manual) Wheelchair Assistance: Supervision/Verbal Herbalist: Both upper extremities Wheelchair Parts Management: Supervision/cueing Distance: 50  Trunk/Postural Assessment  Cervical Assessment Cervical Assessment: Exceptions to [facility] (forward head) Thoracic Assessment Thoracic Assessment: Exceptions to [facility] (rounded shoulders) Lumbar Assessment Lumbar Assessment: Exceptions to [facility] (posterior pelvic tilt in sitting) Postural Control Postural Control: Deficits on evaluation Postural Limitations: decreased  Balance Balance Balance Assessed: Yes Standardized Balance Assessment Standardized Balance Assessment: Timed Up and Go Test Timed Up and Go Test TUG: Normal TUG Normal TUG (seconds): 68 (2nd time took 72seconds) Static Sitting Balance Static Sitting - Balance Support: Feet supported Static Sitting - Level of Assistance: 5: Stand by assistance Dynamic Sitting Balance Dynamic Sitting - Balance Support: Feet supported Dynamic Sitting - Level of  Assistance: 5: Stand by assistance Static Standing Balance Static Standing - Balance Support: During functional activity;Bilateral upper extremity supported Static Standing - Level of Assistance: 4: Min assist Dynamic Standing Balance Dynamic Standing - Balance Support: During functional activity;Bilateral upper extremity supported Dynamic Standing - Level of Assistance: 3: Mod assist;4: Min assist Extremity Assessment      RLE Assessment RLE Assessment: Exceptions to [facility] Active Range of Motion (AROM) Comments: WFL RLE Strength Right Hip Flexion: 3/5 Right Knee Flexion: 3+/5 Right Knee Extension: 4-/5 Right Ankle Dorsiflexion: 3/5 Right Ankle Plantar Flexion: 3+/5 LLE Assessment LLE Assessment: Exceptions to [facility] Active Range of Motion (AROM) Comments: WFL in hip and knee General Strength Comments: assessed in sitting wearing prosthetic (BKA from 2015) LLE Strength Left Hip Flexion: 3/5 Left Knee Flexion: 4/5 Left Knee Extension: 4/5    Tawana Scale , PT, DPT, CSRS  04/25/2020, 3:05 PM

## 2020-04-25 NOTE — Progress Notes (Signed)
[place] PHYSICAL MEDICINE & REHABILITATION PROGRESS NOTE   Subjective/Complaints: No issues overnight  Discussed equipment , rec for Manual WC vs Electric Pt aware of d/c in am , smiling  ROS. No CP, SOB, N/V/D  Objective:   DG Swallowing Func-Speech Pathology  Result Date: 04/24/2020 Objective Swallowing Evaluation: Type of Study: MBS-Modified Barium Swallow Study  Patient Details Name: Jordan [name] MRN: [id number] Date of Birth: 11/25/1940 Today's Date: 04/24/2020 Past Medical History: Past Medical History: Diagnosis Date . Anemia  . Aortic stenosis  . CAD (coronary artery disease)   a. s/p IMI in past tx with POBA and cath 6 mos later with occluded RCA;  b. h/o Taxus DES to LAD and CFX;  c.  cath 1/10: LM ok, mLAD 40-50%, LAD stent ok, D2 tandem 60-70%, pCFX 30%, mAVCFX stent ok, pRCA 40%, RV 90%, RV marginal 90%, dRCA filled L-R collats tx medically . Carotid stenosis   dopplers 5/11: 40-59% bilat . CHF (congestive heart failure) (HCC)  . Diabetic ulcer of left foot (HCC)  . DM2 (diabetes mellitus, type 2) (HCC)  . Heart murmur  . HLD (hyperlipidemia)  . HTN (hypertension)  . Myocardial infarction (HCC) 1995 and 2005  Heart Attack . Osteomyelitis of left foot (HCC)  . Staphylococcus aureus bacteremia with sepsis [facility])  Past Surgical History: Past Surgical History: Procedure Laterality Date . ABDOMINAL AORTAGRAM N/A 04/07/2013  Procedure: ABDOMINAL [name];  Surgeon: [name], MD;  Location: [facility] CATH LAB;  Service: Cardiovascular;  Laterality: N/A; . ABDOMINAL AORTAGRAM N/A 01/26/2014  Procedure: ABDOMINAL [name];  Surgeon: [name], MD;  Location: [facility] CATH LAB;  Service: Cardiovascular;  Laterality: N/A; . ABDOMINAL AORTAGRAM N/A 06/13/2014  Procedure: ABDOMINAL [name];  Surgeon: [name], MD;  Location: [facility] CATH LAB;  Service: Cardiovascular;  Laterality: N/A; . AMPUTATION Left 01/28/2014  Procedure: AMPUTATION DIGIT- LEFT 5TH TOE;  Surgeon: [name],  MD;  Location: [facility] OR;  Service: Vascular;  Laterality: Left; . AMPUTATION Left 02/02/2014  Procedure: Left 4th Ray Amputation vs Transmetatarsal Amputation;  Surgeon: [name], MD;  Location: [facility] OR;  Service: Orthopedics;  Laterality: Left; . AMPUTATION Left 03/24/2014  Procedure: AMPUTATION BELOW KNEE;  Surgeon: [name], MD;  Location: [facility] OR;  Service: Orthopedics;  Laterality: Left; . APPENDECTOMY   . CARDIAC CATHETERIZATION   . CORONARY ANGIOPLASTY WITH STENT PLACEMENT   . FEMORAL-TIBIAL BYPASS GRAFT Left 01/28/2014  Procedure:  FEMORAL-ANTERIOR TIBIAL ARTERY Bypass Graft utilizing composite gortex graft and vein graft;  Surgeon: [name], MD;  Location: [facility] OR;  Service: Vascular;  Laterality: Left; . LOWER EXTREMITY ANGIOGRAM Left 01/26/2014  Procedure: LOWER EXTREMITY ANGIOGRAM;  Surgeon: [name], MD;  Location: [facility] CATH LAB;  Service: Cardiovascular;  Laterality: Left; . RIGHT/LEFT HEART CATH AND CORONARY ANGIOGRAPHY N/A 12/02/2017  Procedure: RIGHT/LEFT HEART CATH AND CORONARY ANGIOGRAPHY;  Surgeon: [place], [name], MD;  Location: [facility] INVASIVE CV LAB;  Service: Cardiovascular;  Laterality: N/A; . TEE WITHOUT CARDIOVERSION N/A 03/28/2014  Procedure: TRANSESOPHAGEAL ECHOCARDIOGRAM (TEE);  Surgeon: Quintella Reichert, MD;  Location: [facility] ENDOSCOPY;  Service: Cardiovascular;  Laterality: N/A; HPI: [name] is a 80 y.o. 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 Sx included weakness  w/ multiple falls, slurred speech, difficulty swallowing, L facial droop. MRI shows small left gangliothalamic infarct. Pt admitted to [facility] 04/08/19.  Assessment / Plan /  Recommendation CHL IP CLINICAL IMPRESSIONS 04/24/2020 Clinical Impression -- Pt continues to present with mild oropharyngeal dysphagia, however improved airway protection since last study on 04/14/20. Pt's swallow initiation occured at the level of the pyriform sinuses with both nectar and thin barium. Intermittent flash penetration (penetrated but also fully cleared from vestibule during swallow) noted to occur with trials of thin, and trace aspiration X2 without sensation when challenged to take larger sips and via straw. However, when pt cued to take single small sips from cup, no more than flash penetration observed. When pt consumed barium pill whole in applesauce, he required one additional presentation of applesauce to aid in anterior to posterior transit of pill, and successfully clear it from his oral cavity. Recommend pt continue regular solids, upgrade to thin liquids but no straws allowed, only small single cup sips, and pills may be given whole in applesauce one at a time, large pills may need to be broken in half when able. Would also recommend extra diligence with oral care (complete prior to PO). Pt should have intermittent supervision during intake to ensure use of safe compensatory swallow strategies. ST will continue to provide skilled interventions to ensure diet safety and efficiency and and increase independence/carryover with use of compensatory swallow strategies.  SLP Visit Diagnosis Dysphagia, oropharyngeal phase (R13.12) Attention and concentration deficit following -- Frontal lobe and executive function deficit following -- Impact on safety and function Mild aspiration risk;Moderate aspiration risk   CHL IP TREATMENT RECOMMENDATION 04/03/2020 Treatment Recommendations Therapy as outlined in treatment plan below   Prognosis 04/03/2020 Prognosis for Safe Diet Advancement Good Barriers to Reach Goals -- Barriers/Prognosis Comment -- CHL IP DIET RECOMMENDATION 04/24/2020 SLP Diet Recommendations  Regular solids;Thin liquid Liquid Administration via Cup;No straw Medication Administration Whole meds with puree Compensations Slow rate;Small sips/bites;Clear throat intermittently;Multiple dry swallows after each bite/sip;Other (Comment) Postural Changes Remain semi-upright after after feeds/meals (Comment);Seated upright at 90 degrees   CHL IP OTHER RECOMMENDATIONS 04/24/2020 Recommended Consults -- Oral Care Recommendations Oral care BID;Oral care before and after PO Other Recommendations --   CHL IP FOLLOW UP RECOMMENDATIONS 04/07/2020 Follow up Recommendations Inpatient Rehab   CHL IP FREQUENCY AND DURATION 04/03/2020 Speech Therapy Frequency (ACUTE ONLY) min 2x/week Treatment Duration 1 week;2 weeks      CHL IP ORAL PHASE 04/24/2020 Oral Phase Impaired Oral - Pudding Teaspoon -- Oral - Pudding Cup -- Oral - Honey Teaspoon -- Oral - Honey Cup -- Oral - Nectar Teaspoon -- Oral - Nectar Cup -- Oral - Nectar Straw -- Oral - Thin Teaspoon -- Oral - Thin Cup Premature spillage;Decreased bolus cohesion;Lingual/palatal residue;Piecemeal swallowing Oral - Thin Straw Lingual/palatal residue;Piecemeal swallowing;Premature spillage;Decreased bolus cohesion Oral - Puree WFL Oral - Mech Soft -- Oral - Regular NT Oral - Multi-Consistency -- Oral - Pill Delayed oral transit;Decreased bolus cohesion;Reduced posterior propulsion Oral Phase - Comment --  CHL IP PHARYNGEAL PHASE 04/24/2020 Pharyngeal Phase Impaired Pharyngeal- Pudding Teaspoon -- Pharyngeal -- Pharyngeal- Pudding Cup -- Pharyngeal -- Pharyngeal- Honey Teaspoon -- Pharyngeal -- Pharyngeal- Honey Cup NT Pharyngeal -- Pharyngeal- Nectar Teaspoon -- Pharyngeal -- Pharyngeal- Nectar Cup Delayed swallow initiation-pyriform sinuses;Reduced airway/laryngeal closure;Penetration/Aspiration during swallow;Trace aspiration Pharyngeal Material enters airway, passes BELOW cords without attempt by patient to eject out (silent aspiration) Pharyngeal- Nectar Straw NT Pharyngeal --  Pharyngeal- Thin Teaspoon NT Pharyngeal -- Pharyngeal-  Thin Cup Delayed swallow initiation-pyriform sinuses;Reduced airway/laryngeal closure;Penetration/Aspiration during swallow Pharyngeal Material enters airway, remains ABOVE vocal cords then ejected out Pharyngeal- Thin Straw Delayed swallow initiation-pyriform sinuses;Reduced airway/laryngeal closure;Penetration/Aspiration during swallow;Trace aspiration Pharyngeal Material enters airway, remains ABOVE vocal cords then ejected out;Material enters airway, passes BELOW cords without attempt by patient to eject out (silent aspiration) Pharyngeal- Puree WFL Pharyngeal -- Pharyngeal- Mechanical Soft -- Pharyngeal -- Pharyngeal- Regular -- Pharyngeal -- Pharyngeal- Multi-consistency -- Pharyngeal -- Pharyngeal- Pill WFL Pharyngeal -- Pharyngeal Comment --  CHL IP CERVICAL ESOPHAGEAL PHASE 04/24/2020 Cervical Esophageal Phase WFL Pudding Teaspoon -- Pudding Cup -- Honey Teaspoon -- Honey Cup -- Nectar Teaspoon -- Nectar Cup -- Nectar Straw -- Thin Teaspoon -- Thin Cup -- Thin Straw -- Puree -- Mechanical Soft -- Regular -- Multi-consistency -- Pill -- Cervical Esophageal Comment -- [place] Ish[place]arin E Smith 04/24/2020, 9:47 AM              Recent Labs    04/24/20 0529  WBC 8.4  HGB 11.5*  HCT 34.2*  PLT 384   Recent Labs    04/24/20 0529  NA 133*  K 3.5  CL 100  CO2 24  GLUCOSE 121*  BUN 10  CREATININE 1.06  CALCIUM 8.7*    Intake/Output Summary (Last 24 hours) at 04/25/2020 1016 Last data filed at 04/25/2020 0800 Gross per 24 hour  Intake 460 ml  Output 975 ml  Net -515 ml        Physical Exam: Vital Signs Blood pressure 139/71, pulse 76, temperature 97.8 F (36.6 C), resp. rate 18, height 5\' 9"  (1.753 m), weight 91.3 kg, SpO2 96 %.  General: No acute distress Mood and affect are appropriate Heart: Regular rate and rhythm no rubs murmurs or extra sounds Lungs: Clear to auscultation, breathing unlabored, no rales or wheezes Abdomen: Positive  bowel sounds, soft nontender to palpation, nondistended   Neuro: Awake, aphasic, exp>recept Right central 7 with dysarthria ongoing.  Sensory exam is normal. Reflexes are 2+ in all 4's. No tremors. RUE 4-/5 prox to distal with PD. RLE 4/5--motor exam unchanged today. 5/5 LUE and LLE.   Musculoskeletal: no pain with Right hip ROM or with AROM, no pain to palp over greater troch       Assessment/Plan: 1. Functional deficits which require 3+ hours per day of interdisciplinary therapy in a comprehensive inpatient rehab setting.  Physiatrist is providing close team supervision and 24 hour management of active medical problems listed below.  Physiatrist and rehab team continue to assess barriers to discharge/monitor patient progress toward functional and medical goals  Care Tool:  Bathing    Body parts bathed by patient: Right arm,Right upper leg,Left arm,Left upper leg,Chest,Right lower leg,Abdomen,Front perineal area,Face   Body parts bathed by helper: Buttocks Body parts n/a: Left lower leg   Bathing assist Assist Level: Minimal Assistance - Patient > 75%     Upper Body Dressing/Undressing Upper body dressing   What is the patient wearing?: Pull over shirt    Upper body assist Assist Level: Supervision/Verbal cueing    Lower Body Dressing/Undressing Lower body dressing      What is the patient wearing?: Pants,Incontinence brief     Lower body assist Assist for lower body dressing: Maximal Assistance - Patient 25 - 49% (bed level)     Toileting Toileting    Toileting assist Assist for toileting: Moderate Assistance - Patient 50 - 74%     Transfers Chair/bed transfer  Transfers assist  Chair/bed transfer assist level: Minimal Assistance - Patient > 75% Chair/bed transfer assistive device: ArboriculturistWalker   Locomotion Ambulation   Ambulation assist      Assist level: Contact Guard/Touching assist Assistive device: Walker-rolling Max distance: 10'   Walk 10  feet activity   Assist     Assist level: Moderate Assistance - Patient - 50 - 74% Assistive device: Walker-rolling   Walk 50 feet activity   Assist Walk 50 feet with 2 turns activity did not occur: Safety/medical concerns  Assist level: Moderate Assistance - Patient - 50 - 74% Assistive device: Walker-rolling    Walk 150 feet activity   Assist Walk 150 feet activity did not occur: Safety/medical concerns         Walk 10 feet on uneven surface  activity   Assist Walk 10 feet on uneven surfaces activity did not occur: Safety/medical concerns         Wheelchair     Assist Will patient use wheelchair at discharge?: Yes Type of Wheelchair: Power    Wheelchair assist level: Supervision/Verbal cueing,Contact Guard/Touching assist Max wheelchair distance: 30'    Wheelchair 50 feet with 2 turns activity    Assist        Assist Level: Supervision/Verbal cueing,Contact Guard/Touching assist   Wheelchair 150 feet activity     Assist      Assist Level: Supervision/Verbal cueing,Contact Guard/Touching assist   Blood pressure 139/71, pulse 76, temperature 97.8 F (36.6 C), resp. rate 18, height 5\' 9"  (1.753 m), weight 91.3 kg, SpO2 96 %.  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 Antithrombotics: -DVT/anticoagulation:Lovenox -antiplatelet therapy: Aspirin 81 mg daily and Plavix 75 mg daily x3 weeks then Plavix alone 3. Pain Management:Tylenol as needed- ? R hip pain , cannot reproduce PT will monitor while pt in therapy  4. Mood:Resume Lexapro 10 mg daily -antipsychotic agents: N/A 5. Neuropsych: This patientiscapable of making decisions on hisown behalf. 6. Skin/Wound Care:Routine skin checks 7.  Fluids/Electrolytes/Nutrition:Routine in and outs with follow-up chemistries 8. Dysphagia. Carb modified nectar thick liquid.   Upgrade to reg diet , thin liq, d/c nocturnal IVF 9. Diabetes mellitus with obesity,peripheral neuropathy. Hemoglobin A1c 8.5.   -Home regimen:NovoLog 12units 3 times daily, Lantus insulin 40units twice daily.  -1/9 sugars remain elevated     CBG (last 3)  Recent Labs    04/24/20 1612 04/24/20 2055 04/25/20 0547  GLUCAP 173* 106* 71   Improve after increase of Lantus to 30U.On Victoza at home   Needs bedtime snack 10. AKI on CKD stage III. Baseline creatinine 1.1-1.4. Cr normal at 1.12 on 1.14, repeat weekly 11. Permissive hypertension. Presently on Toprol-XL 25 mg daily. Patient on Norvasc 10 mg nightly, Cozaar 100 mg daily, Imdur 30 mg nightly prior to admission.   Vitals:   04/24/20 2033 04/25/20 0434  BP: 134/68 139/71  Pulse: 84 76  Resp: 20 18  Temp: 98.6 F (37 C) 97.8 F (36.6 C)  SpO2: 95% 96%   cont. amlodipine 10mg - controlled 1/25 12. Hyperlipidemia. Continue Zetia/Crestor 13. CAD with stenting. Continue aspirin and Plavix 14. 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  Constipation senna S 2 po TID,  increase sorbitol Urinary retention requiring straight cath, place foley in am and f/u  Uro 16. Bowel incontinence  1/22- likely due to stroke-a routine would be helpful     LOS: 18 days A FACE TO FACE EVALUATION WAS PERFORMED  Erick Colace 04/25/2020, 10:16 AM

## 2020-04-25 NOTE — Progress Notes (Signed)
Occupational Therapy Discharge Summary  Patient Details  Name: Jordan [name] MRN: [id number] Date of Birth: 1941-03-20  Today's Date: 04/25/2020 OT Individual Time: 1004-1059 OT Individual Time Calculation (min): 55 min   Session Note:  Pt in bed to start session with completion of supine to sit on the right side of the bed with min assist.  Once on the EOB, he was able to donn his LLE prosthesis with supervision and then needed min assist for donning his right shoe. Pain meds were requested secondary to report of increased back pain.  He completed stand pivot transfer from the EOB to the wheelchair with min assist using the RW and mod demonstrational cueing for initiation as he would just sit there when given only verbal cue to stand.  At the sink he requested to work on shaving as well as oral hygiene.  He was able to complete both at supervision level with increased time.  Finished session with completion of 7 hole peg test for look at [facility] coordination.  He was able to complete this with the left hand in 52 seconds and with the right at 50 seconds.  Finished session with pt in the room and nursing present to give pain meds.   Patient has met 2 of 9 long term goals due to improved activity tolerance, improved balance, ability to compensate for deficits, functional use of  RIGHT upper extremity, improved attention, improved awareness and improved coordination.  Patient to discharge at [facility] Assist level.  Patient's care partner is independent to provide the necessary physical and cognitive assistance at discharge.    Reasons goals not met: Pt needs min assist or greater for all bathing, dressing, toileting, and transfers to different surfaces.  Recommendation:  Patient will benefit from ongoing skilled OT services in home health setting to continue to advance functional skills in the area of BADL and Reduce care partner burden.  Pt continues to show mild impairments in RUE coordination as well  as decreased ability to complete sit to stand and functional transfers as they relate to selfcare independence.  Feel he will benefit from continued HHOT to address these deficits and increase independence to at least a supervision level.   Equipment: No equipment provided  Reasons for discharge: treatment goals met and discharge from hospital  Patient/family agrees with progress made and goals achieved: Yes  OT Discharge Precautions/Restrictions  Precautions Precautions: Fall Precaution Comments: L prosthetic Required Braces or Orthoses: Other Brace Other Brace: L prosthetic Restrictions Weight Bearing Restrictions: No   Pain Pain Assessment Pain Scale: 0-10 Pain Score: 4  Pain Type: Acute pain Pain Location: Back Pain Orientation: Lower;Medial Pain Descriptors / Indicators: Sore Pain Onset: On-going Pain Intervention(s): Medication (See eMAR);Repositioned;Other (Comment);Rest (pt reports he believes it is associated with increased time in the bed) ADL ADL Equipment Provided: Sock aid,Long-handled sponge Eating: Independent Where Assessed-Eating: Wheelchair Grooming: Supervision/safety Where Assessed-Grooming: Sitting at sink Upper Body Bathing: Supervision/safety Where Assessed-Upper Body Bathing: Shower Lower Body Bathing: Minimal assistance Where Assessed-Lower Body Bathing: Shower Upper Body Dressing: Supervision/safety Where Assessed-Upper Body Dressing: Wheelchair Lower Body Dressing: Moderate assistance Where Assessed-Lower Body Dressing: Wheelchair Toileting: Minimal assistance Where Assessed-Toileting: Bedside Commode Toilet Transfer: Minimal assistance Toilet Transfer Method: Stand Designer, fashion/clothing: Radiographer, therapeutic: Not assessed Social research officer, government: Minimal assistance Social research officer, government Method: Heritage manager: Radio broadcast assistant Vision Baseline Vision/History: Wears  glasses Wears Glasses: At all times Patient Visual Report: No change from baseline Vision Assessment?: Yes  Eye Alignment: Within Functional Limits Ocular Range of Motion: Within Functional Limits Alignment/Gaze Preference: Within Defined Limits Tracking/Visual Pursuits: Decreased smoothness of horizontal tracking;Decreased smoothness of vertical tracking Saccades: Within functional limits Visual Fields: No apparent deficits Perception  Perception: Within Functional Limits Inattention/Neglect: Appears intact Praxis Praxis: Impaired Praxis Impairment Details: Initiation Cognition Overall Cognitive Status: Impaired/Different from baseline Arousal/Alertness: Awake/alert Orientation Level: Oriented X4 Attention: Sustained;Selective Focused Attention: Appears intact Sustained Attention: Appears intact Selective Attention: Impaired Selective Attention Impairment: Verbal basic;Functional basic Memory: Impaired Memory Impairment: Decreased recall of new information;Decreased short term memory;Retrieval deficit Awareness: Impaired Awareness Impairment: Emergent impairment;Anticipatory impairment Problem Solving: Impaired Initiating: Impaired (during mobility) Self Monitoring: Impaired (during mobility) Safety/Judgment: Impaired Comments: Pt has decreased awareness of impact of current physical and cognitive deficits on safety at home and with use of power wheelchair Sensation Sensation Light Touch: Impaired Detail Peripheral sensation comments: peripherial neuropathy in BLE in distal RLE (below knee) and near amputation on the LLE. Pt reports tingling in bilateral hands Light Touch Impaired Details: Impaired RUE;Impaired LUE Hot/Cold: Not tested Proprioception: Appears Intact Stereognosis: Appears Intact Additional Comments: Pt reports history of numbness in bilateral fingertips. Coordination Gross Motor Movements are Fluid and Coordinated: No Fine Motor Movements are Fluid and  Coordinated: Yes Coordination and Movement Description: decreased speed with finger to nose on the right compared to the left.  Decreased gross coordination when attempting to push the wheelchair as it will veer to the right secondary to not being able to coordinate the RUE to push as well as the left. Motor  Motor Motor: Motor apraxia;Hemiplegia Motor - Discharge Observations: impaired motor planning with delayed initiation during sit to stand and with functional mobility Mobility  Bed Mobility Bed Mobility: Supine to Sit;Sit to Supine Supine to Sit: Minimal Assistance - Patient > 75% Sit to Supine: Minimal Assistance - Patient > 75% Transfers Sit to Stand: Minimal Assistance - Patient > 75% Stand to Sit: Minimal Assistance - Patient > 75%  Trunk/Postural Assessment  Cervical Assessment Cervical Assessment: Exceptions to [facility] (forward head) Thoracic Assessment Thoracic Assessment: Exceptions to [facility] (thoracic rounding) Lumbar Assessment Lumbar Assessment: Exceptions to [facility] (posterior pelvic tilt)  Balance Balance Balance Assessed: Yes Standardized Balance Assessment Standardized Balance Assessment: Timed Up and Go Test Timed Up and Go Test TUG: Normal TUG Normal TUG (seconds): 68 (2nd time took 72seconds) Static Sitting Balance Static Sitting - Balance Support: Feet supported Static Sitting - Level of Assistance: 5: Stand by assistance Dynamic Sitting Balance Dynamic Sitting - Balance Support: Feet supported Dynamic Sitting - Level of Assistance: 5: Stand by assistance Static Standing Balance Static Standing - Balance Support: During functional activity Static Standing - Level of Assistance: 4: Min assist Dynamic Standing Balance Dynamic Standing - Balance Support: Bilateral upper extremity supported;During functional activity Dynamic Standing - Level of Assistance: 4: Min assist Extremity/Trunk Assessment RUE Assessment RUE Assessment: Exceptions to [facility] Passive Range of  Motion (PROM) Comments: PROM WFLS for all joints Active Range of Motion (AROM) Comments: WFL General Strength Comments: RUE 4-/5, decreased gross motor coordination when compared with the left, but functionally uses at a non-dominant level. LUE Assessment LUE Assessment: Within Functional Limits   Makiah Clauson OTR/L 04/25/2020, 5:16 PM

## 2020-04-25 NOTE — Plan of Care (Addendum)
  Problem: RH BLADDER ELIMINATION Goal: RH STG MANAGE BLADDER WITH ASSISTANCE Description: STG Manage Bladder With mod I Assistance Outcome: Not Progressing; I AND O CATH; wife to come in the morning for education

## 2020-04-25 NOTE — Progress Notes (Signed)
Speech Language Pathology Discharge Summary  Patient Details  Name: Jordan [name] MRN: [id number] Date of Birth: 1940/07/27  Today's Date: 04/25/2020 SLP Individual Time: 0900-0955 SLP Individual Time Calculation (min): 55 min   Skilled Therapeutic Interventions:  Pt was seen for skilled ST targeting dysphagia and cognitive-linguistic skills. Given recent upgrade to thin liquids, SLP provided skilled observation of pt consuming thin H2O via straw. He verbally recalled and implemented his compensatory swallow strategies and precautions with Supervision A verbal cues. No overt s/sx aspiration noted during intake. Recommend continue current diet. SLP also administered Cognistat evaluation as means of post-test for cognitive-linguistic function for this admission. Scores that fell outside of normal limits included short term memory (5-moderately severe) and calculations (2-mild). Pt still noted to have decreased anticipatory awareness of impact of cognitive deficits on functioning in conversation regarding power wheelchair. Education provided regarding safety and delayed processing as well as memory in relation to power chair and other functional tasks at home. Pt was 100% intelligible throughout session, and able to perform EMST exercises with device set to 12.5 cm H2O resistance with Supervision A level cues. Pt's vocal quality is still somewhat decreased, and he would continue to benefit from voice and breath support exercises to increase intensity at next venue of care. Pt left laying in bed with alarm set and needs within reach. Continue per current plan of care.    Patient has met 8 of 8 long term goals.  Patient to discharge at overall Supervision;Min level.  Reasons goals not met: n/a   Clinical Impression/Discharge Summary:   Pt made functional gains and met 8 out of 8 long term goals this admission. Pt currently requires Supervision-Min assist for functional basic to mildly complex tasks due to  dysphagia, voice disorder, and cognitive impairments impacting his short term memory, problem solving, selective attention, emergent and anticipatory awareness, and delayed processing speed. Pt is mostly supervision for basic familiar tasks, but Min A as complexity increases. As a result,pt will require 24/7 supervision at home. Pt is consuming an upgraded regular texture diet with thin liquids. He requires Supervision A cueing for recall and implementation of compensatory swallow strategies (no straw, intermittent throat clear, small single sips from cup). Pt has demonstrated improved basic cognitive functioning, vocal intensity, and oropharyngeal swallow function. His voice is still somewhat soft and with diminished breath support, EMST exercises have been started. However, given swallowing, cognitive, and voice deficits still present, recommend pt continue to receive skilled ST services upon discharge. Pt and family education is complete at this time.   Care Partner:  Caregiver Able to Provide Assistance: Yes  Type of Caregiver Assistance: Cognitive  Recommendation:  Home Health SLP;Outpatient SLP;24 hour supervision/assistance  Rationale for SLP Follow Up: Maximize functional communication;Maximize swallowing safety;Maximize cognitive function and independence;Reduce caregiver burden   Equipment: none   Reasons for discharge: Discharged from hospital   Patient/Family Agrees with Progress Made and Goals Achieved: Yes    Arbutus Leas 04/25/2020, 12:09 PM

## 2020-04-26 LAB — GLUCOSE, CAPILLARY
Glucose-Capillary: 55 mg/dL — ABNORMAL LOW (ref 70–99)
Glucose-Capillary: 58 mg/dL — ABNORMAL LOW (ref 70–99)
Glucose-Capillary: 87 mg/dL (ref 70–99)
Glucose-Capillary: 183 mg/dL — ABNORMAL HIGH (ref 70–99)

## 2020-04-26 NOTE — Progress Notes (Signed)
Inpatient Rehabilitation Care Coordinator Discharge Note  The overall goal for the admission was met for:   Discharge location: Yes, home   Length of Stay: Yes, 19 Days  Discharge activity level: Yes  Home/community participation: Yes  Services provided included: MD, RD, PT, OT, SLP, RN, CM, TR, Pharmacy, [place]: Private Insurance: [employer]  Choices offered to/list presented CW:[id number]/TDVVOH  Follow-up services arranged: Home Health: [facility]   Comments (or additional information): PT OT [facility]  Patient/Family verbalized understanding of follow-up arrangements: Yes  Individual responsible for coordination of the follow-up plan: Enid Derry [phone]  Confirmed correct DME delivered: [name] 04/26/2020    [name]

## 2020-04-26 NOTE — Discharge Instructions (Signed)
Inpatient Rehab Discharge Instructions  Azarel Banner Discharge date and time: No discharge date for patient encounter.   Activities/Precautions/ Functional Status: Activity: activity as tolerated Diet: diabetic diet Wound Care: Routine skin checks Functional status:  ___ No restrictions     ___ Walk up steps independently ___ 24/7 supervision/assistance   ___ Walk up steps with assistance ___ Intermittent supervision/assistance  ___ Bathe/dress independently ___ Walk with walker     __x_ Bathe/dress with assistance ___ Walk Independently    ___ Shower independently ___ Walk with assistance    ___ Shower with assistance ___ No alcohol     ___ Return to work/school ________  COMMUNITY REFERRALS UPON DISCHARGE:    Home Health:   PT     OT     ST                   Agency: [facility] Phone: [phone]    Medical Equipment/Items Ordered: Wheelchair, Agricultural consultant                                                 Agency/Supplier: [employer]   Special Instructions:  No driving smoking or alcohol  Routine Foley catheter care.  Ambulatory referral obtained with urology services to address urinary retention   STROKE/TIA DISCHARGE INSTRUCTIONS SMOKING Cigarette smoking nearly doubles your risk of having a stroke & is the single most alterable risk factor  If you smoke or have smoked in the last 12 months, you are advised to quit smoking for your health.  Most of the excess cardiovascular risk related to smoking disappears within a year of stopping.  Ask you doctor about anti-smoking medications  [place] Quit Line: 1-800-QUIT NOW  Free Smoking Cessation Classes [phone]  CHOLESTEROL Know your levels; limit fat & cholesterol in your diet  Lipid Panel     Component Value Date/Time   CHOL 165 04/01/2020 0154   CHOL 189 10/12/2018 0905   TRIG 374 (H) 04/01/2020 0154   HDL 24 (L) 04/01/2020 0154   HDL 27 (L) 10/12/2018 0905   CHOLHDL 6.9 04/01/2020 0154   VLDL  75 (H) 04/01/2020 0154   LDLCALC 66 04/01/2020 0154   LDLCALC 115 (H) 10/12/2018 0905      Many patients benefit from treatment even if their cholesterol is at goal.  Goal: Total Cholesterol (CHOL) less than 160  Goal:  Triglycerides (TRIG) less than 150  Goal:  HDL greater than 40  Goal:  LDL (LDLCALC) less than 100   BLOOD PRESSURE American Stroke Association blood pressure target is less that 120/80 mm/Hg  Your discharge blood pressure is:  BP: (!) 154/61  Monitor your blood pressure  Limit your salt and alcohol intake  Many individuals will require more than one medication for high blood pressure  DIABETES (A1c is a blood sugar average for last 3 months) Goal HGBA1c is under 7% (HBGA1c is blood sugar average for last 3 months)  Diabetes:    Lab Results  Component Value Date   HGBA1C 8.5 (H) 04/01/2020     Your HGBA1c can be lowered with medications, healthy diet, and exercise.  Check your blood sugar as directed by your physician  Call your physician if you experience unexplained or low blood sugars.  PHYSICAL ACTIVITY/REHABILITATION Goal is 30 minutes at least 4 days per week  Activity: Increase activity slowly, Therapies: Physical Therapy: Home Health Return to work:   Activity decreases your risk of heart attack and stroke and makes your heart stronger.  It helps control your weight and blood pressure; helps you relax and can improve your mood.  Participate in a regular exercise program.  Talk with your doctor about the best form of exercise for you (dancing, walking, swimming, cycling).  DIET/WEIGHT Goal is to maintain a healthy weight  Your discharge diet is:  Diet Order    None      liquids Your height is:    Your current weight is:   Your Body Mass Index (BMI) is:     Following the type of diet specifically designed for you will help prevent another stroke.  Your goal weight range is:    Your goal Body Mass Index (BMI) is 19-24.  Healthy food  habits can help reduce 3 risk factors for stroke:  High cholesterol, hypertension, and excess weight.  RESOURCES Stroke/Support Group:  Call [phone]   STROKE EDUCATION PROVIDED/REVIEWED AND GIVEN TO PATIENT Stroke warning signs and symptoms How to activate emergency medical system (call 911). Medications prescribed at discharge. Need for follow-up after discharge. Personal risk factors for stroke. Pneumonia vaccine given:  Flu vaccine given:  My questions have been answered, the writing is legible, and I understand these instructions.  I will adhere to these goals & educational materials that have been provided to me after my discharge from the hospital.       My questions have been answered and I understand these instructions. I will adhere to these goals and the provided educational materials after my discharge from the hospital.  Patient/Caregiver Signature _______________________________ Date __________  Clinician Signature _______________________________________ Date __________  Please bring this form and your medication list with you to all your follow-up doctor's appointments.

## 2020-04-26 NOTE — Progress Notes (Signed)
Hypoglycemic Event  CBG: 56  Treatment: orange juice 4 oz x2  Symptoms: n/a  Follow-up CBG: Time064 CBG Result:87  Possible Reasons for Event : Unknown  Comments/MD notified:n/a     Jordan [name] R [name]

## 2020-04-26 NOTE — Progress Notes (Signed)
[place] PHYSICAL MEDICINE & REHABILITATION PROGRESS NOTE   Subjective/Complaints: Low CBG this morning, discussed with patient and wife need for a bedtime snack once he gets home.  His wife plans to give him peanut butter crackers. Discussed urinary retention need for urology follow-up.  Nursing has tried education for I/O caths with wife but she is not comfortable therefore Foley will be placed  ROS. No CP, SOB, N/V/D  Objective:   DG Swallowing Func-Speech Pathology  Result Date: 04/24/2020 Objective Swallowing Evaluation: Type of Study: MBS-Modified Barium Swallow Study  Patient Details Name: Jordan [name] MRN: [id number] Date of Birth: Nov 02, 1940 Today's Date: 04/24/2020 Past Medical History: Past Medical History: Diagnosis Date . Anemia  . Aortic stenosis  . CAD (coronary artery disease)   a. s/p IMI in past tx with POBA and cath 6 mos later with occluded RCA;  b. h/o Taxus DES to LAD and CFX;  c.  cath 1/10: LM ok, mLAD 40-50%, LAD stent ok, D2 tandem 60-70%, pCFX 30%, mAVCFX stent ok, pRCA 40%, RV 90%, RV marginal 90%, dRCA filled L-R collats tx medically . Carotid stenosis   dopplers 5/11: 40-59% bilat . CHF (congestive heart failure) (HCC)  . Diabetic ulcer of left foot (HCC)  . DM2 (diabetes mellitus, type 2) (HCC)  . Heart murmur  . HLD (hyperlipidemia)  . HTN (hypertension)  . Myocardial infarction (HCC) 1995 and 2005  Heart Attack . Osteomyelitis of left foot (HCC)  . Staphylococcus aureus bacteremia with sepsis [facility])  Past Surgical History: Past Surgical History: Procedure Laterality Date . ABDOMINAL AORTAGRAM N/A 04/07/2013  Procedure: ABDOMINAL [name];  Surgeon: [name], MD;  Location: [facility] CATH LAB;  Service: Cardiovascular;  Laterality: N/A; . ABDOMINAL AORTAGRAM N/A 01/26/2014  Procedure: ABDOMINAL [name];  Surgeon: [name], MD;  Location: [facility] CATH LAB;  Service: Cardiovascular;  Laterality: N/A; . ABDOMINAL AORTAGRAM N/A 06/13/2014  Procedure: ABDOMINAL [name];   Surgeon: [name], MD;  Location: [facility] CATH LAB;  Service: Cardiovascular;  Laterality: N/A; . AMPUTATION Left 01/28/2014  Procedure: AMPUTATION DIGIT- LEFT 5TH TOE;  Surgeon: [name], MD;  Location: [facility] OR;  Service: Vascular;  Laterality: Left; . AMPUTATION Left 02/02/2014  Procedure: Left 4th Ray Amputation vs Transmetatarsal Amputation;  Surgeon: [name], MD;  Location: [facility] OR;  Service: Orthopedics;  Laterality: Left; . AMPUTATION Left 03/24/2014  Procedure: AMPUTATION BELOW KNEE;  Surgeon: [name], MD;  Location: [facility] OR;  Service: Orthopedics;  Laterality: Left; . APPENDECTOMY   . CARDIAC CATHETERIZATION   . CORONARY ANGIOPLASTY WITH STENT PLACEMENT   . FEMORAL-TIBIAL BYPASS GRAFT Left 01/28/2014  Procedure:  FEMORAL-ANTERIOR TIBIAL ARTERY Bypass Graft utilizing composite gortex graft and vein graft;  Surgeon: [name], MD;  Location: [facility] OR;  Service: Vascular;  Laterality: Left; . LOWER EXTREMITY ANGIOGRAM Left 01/26/2014  Procedure: LOWER EXTREMITY ANGIOGRAM;  Surgeon: [name], MD;  Location: [facility] CATH LAB;  Service: Cardiovascular;  Laterality: Left; . RIGHT/LEFT HEART CATH AND CORONARY ANGIOGRAPHY N/A 12/02/2017  Procedure: RIGHT/LEFT HEART CATH AND CORONARY ANGIOGRAPHY;  Surgeon: Sw[place], [name], MD;  Location: [facility] INVASIVE CV LAB;  Service: Cardiovascular;  Laterality: N/A; . TEE WITHOUT CARDIOVERSION N/A 03/28/2014  Procedure: TRANSESOPHAGEAL ECHOCARDIOGRAM (TEE);  Surgeon: Quintella Reichert, MD;  Location: [facility] ENDOSCOPY;  Service: Cardiovascular;  Laterality: N/A; HPI: [name] is a 80 y.o. 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 Sx included weakness w/ multiple falls, slurred speech, difficulty swallowing, L facial droop. MRI shows small left gangliothalamic infarct. Pt admitted to [facility] 04/08/19.  Assessment / Plan / Recommendation CHL IP CLINICAL IMPRESSIONS 04/24/2020 Clinical Impression -- Pt continues to present with mild oropharyngeal dysphagia, however improved airway protection since last study on 04/14/20. Pt's swallow initiation occured at the level of the pyriform sinuses with both nectar and thin barium. Intermittent flash penetration (penetrated but also fully cleared from vestibule during swallow) noted to occur with trials of thin, and trace aspiration X2 without sensation when challenged to take larger sips and via straw. However, when pt cued to take single small sips from cup, no more than flash penetration observed. When pt consumed barium pill whole in applesauce, he required one additional presentation of applesauce to aid in anterior to posterior transit of pill, and successfully clear it from his oral cavity. Recommend pt continue regular solids, upgrade to thin liquids but no straws allowed, only small single cup sips, and pills may be given whole in applesauce one at a time, large pills may need to be broken in half when able. Would also recommend extra diligence with oral care (complete prior to PO). Pt should have intermittent supervision during intake to ensure use of safe compensatory swallow strategies. ST will continue to provide skilled interventions to ensure diet safety and efficiency and and increase independence/carryover with use of compensatory swallow strategies.  SLP Visit Diagnosis Dysphagia, oropharyngeal phase (R13.12) Attention and concentration deficit following -- Frontal lobe and executive function deficit following -- Impact on safety and function Mild aspiration risk;Moderate aspiration risk   CHL IP TREATMENT RECOMMENDATION 04/03/2020 Treatment Recommendations Therapy as outlined in  treatment plan below   Prognosis 04/03/2020 Prognosis for Safe Diet Advancement Good Barriers to Reach Goals -- Barriers/Prognosis Comment -- CHL IP DIET RECOMMENDATION 04/24/2020 SLP Diet Recommendations Regular solids;Thin liquid Liquid Administration via Cup;No straw Medication Administration Whole meds with puree Compensations Slow rate;Small sips/bites;Clear throat intermittently;Multiple dry swallows after each bite/sip;Other (Comment) Postural Changes Remain semi-upright after after feeds/meals (Comment);Seated upright at 90 degrees   CHL IP OTHER RECOMMENDATIONS 04/24/2020 Recommended Consults -- Oral Care Recommendations Oral care BID;Oral care before and after PO Other Recommendations --   CHL IP FOLLOW UP RECOMMENDATIONS 04/07/2020 Follow up Recommendations Inpatient Rehab   CHL IP FREQUENCY AND DURATION 04/03/2020 Speech Therapy Frequency (ACUTE ONLY) min 2x/week Treatment Duration 1 week;2 weeks      CHL IP ORAL PHASE 04/24/2020 Oral Phase Impaired Oral - Pudding Teaspoon -- Oral - Pudding Cup -- Oral - Honey Teaspoon -- Oral - Honey Cup -- Oral - Nectar Teaspoon -- Oral - Nectar Cup -- Oral - Nectar Straw -- Oral - Thin Teaspoon -- Oral - Thin Cup Premature spillage;Decreased bolus cohesion;Lingual/palatal residue;Piecemeal swallowing Oral - Thin Straw Lingual/palatal residue;Piecemeal swallowing;Premature spillage;Decreased bolus cohesion Oral - Puree WFL Oral - Mech Soft -- Oral - Regular NT Oral - Multi-Consistency -- Oral - Pill Delayed oral transit;Decreased bolus cohesion;Reduced posterior propulsion Oral Phase - Comment --  CHL IP PHARYNGEAL PHASE 04/24/2020 Pharyngeal Phase Impaired Pharyngeal- Pudding Teaspoon -- Pharyngeal -- Pharyngeal- Pudding Cup -- Pharyngeal -- Pharyngeal- Honey Teaspoon -- Pharyngeal -- Pharyngeal- Honey Cup NT Pharyngeal -- Pharyngeal- Nectar Teaspoon -- Pharyngeal -- Pharyngeal- Nectar Cup Delayed swallow initiation-pyriform sinuses;Reduced airway/laryngeal  closure;Penetration/Aspiration during swallow;Trace aspiration Pharyngeal Material enters airway, passes BELOW cords without attempt by patient to eject out (silent aspiration) Pharyngeal- Nectar Straw NT Pharyngeal -- Pharyngeal- Thin Teaspoon NT Pharyngeal -- Pharyngeal- Thin Cup Delayed swallow initiation-pyriform sinuses;Reduced airway/laryngeal closure;Penetration/Aspiration during swallow Pharyngeal Material enters airway, remains ABOVE vocal cords then ejected out Pharyngeal- Thin Straw Delayed swallow initiation-pyriform sinuses;Reduced airway/laryngeal closure;Penetration/Aspiration during swallow;Trace aspiration Pharyngeal Material enters airway, remains ABOVE vocal cords then ejected out;Material enters airway, passes BELOW cords without attempt by patient to eject out (silent aspiration) Pharyngeal- Puree WFL Pharyngeal -- Pharyngeal- Mechanical Soft -- Pharyngeal -- Pharyngeal- Regular -- Pharyngeal -- Pharyngeal- Multi-consistency -- Pharyngeal -- Pharyngeal- Pill WFL Pharyngeal -- Pharyngeal Comment --  CHL IP CERVICAL ESOPHAGEAL PHASE 04/24/2020 Cervical Esophageal Phase WFL Pudding Teaspoon -- Pudding Cup -- Honey Teaspoon -- Honey Cup -- Nectar Teaspoon -- Nectar Cup -- Nectar Straw -- Thin Teaspoon -- Thin Cup -- Thin Straw -- Puree -- Mechanical Soft -- Regular -- Multi-consistency -- Pill -- Cervical Esophageal Comment -- [place] Ish[place]arin E Smith 04/24/2020, 9:47 AM              Recent Labs    04/24/20 0529  WBC 8.4  HGB 11.5*  HCT 34.2*  PLT 384   Recent Labs    04/24/20 0529  NA 133*  K 3.5  CL 100  CO2 24  GLUCOSE 121*  BUN 10  CREATININE 1.06  CALCIUM 8.7*    Intake/Output Summary (Last 24 hours) at 04/26/2020 0916 Last data filed at 04/26/2020 0800 Gross per 24 hour  Intake 460 ml  Output 200 ml  Net 260 ml        Physical Exam: Vital Signs Blood pressure (!) 147/65, pulse 73, temperature (!) 97.5 F (36.4 C), resp. rate 20, height 5\' 9"  (1.753 m), weight 91.9 kg, SpO2  99 %.  General: No acute distress Mood and affect are appropriate Heart: Regular rate and rhythm no rubs murmurs or extra sounds Lungs: Clear to auscultation, breathing unlabored, no rales or wheezes Abdomen: Positive bowel sounds, soft nontender to palpation, nondistended Extremities: No clubbing, cyanosis, or edema Skin: No evidence of breakdown, no evidence of rash   Neuro: Awake, aphasic, exp>recept Right central 7 with dysarthria ongoing.  Sensory exam is normal. Reflexes are 2+ in all 4's. No tremors. RUE 4-/5 prox to distal with PD. RLE 4/5--motor exam unchanged today. 5/5 LUE and LLE.   Musculoskeletal: no pain with Right hip ROM or with AROM, no pain to palp over greater troch       Assessment/Plan: 1. Functional deficits due to CVA Stable for D/C today F/u PCP in 3-4 weeks F/u PM&R 2 weeks See D/C summary See D/C instructions Care Tool:  Bathing    Body parts bathed by patient: Right arm,Right upper leg,Left arm,Left upper leg,Chest,Right lower leg,Abdomen,Front perineal area,Face,Left lower leg   Body parts bathed by helper: Buttocks Body parts n/a: Left lower leg   Bathing assist Assist Level: Minimal Assistance - Patient > 75%     Upper Body Dressing/Undressing Upper body dressing   What is the patient wearing?: Pull over shirt    Upper body assist Assist Level: Supervision/Verbal cueing    Lower Body Dressing/Undressing Lower body dressing      What is the patient wearing?: Pants,Incontinence brief     Lower body assist Assist for lower body dressing: Minimal Assistance - Patient > 75%     Toileting Toileting    Toileting assist Assist for toileting: Minimal  Assistance - Patient > 75%     Transfers Chair/bed transfer  Transfers assist     Chair/bed transfer assist level: Contact Guard/Touching assist Chair/bed transfer assistive device: Licensed conveyancer   Ambulation assist      Assist level: Minimal  Assistance - Patient > 75% Assistive device: Walker-rolling Max distance: 50ft   Walk 10 feet activity   Assist     Assist level: Minimal Assistance - Patient > 75% Assistive device: Walker-rolling   Walk 50 feet activity   Assist Walk 50 feet with 2 turns activity did not occur: Safety/medical concerns  Assist level: Minimal Assistance - Patient > 75% Assistive device: Walker-rolling    Walk 150 feet activity   Assist Walk 150 feet activity did not occur: Safety/medical concerns         Walk 10 feet on uneven surface  activity   Assist Walk 10 feet on uneven surfaces activity did not occur: Safety/medical concerns   Assist level: Moderate Assistance - Patient - 50 - 74% Assistive device: Photographer Will patient use wheelchair at discharge?: Yes Type of Wheelchair: Manual    Wheelchair assist level: Supervision/Verbal cueing Max wheelchair distance: 15ft    Wheelchair 50 feet with 2 turns activity    Assist        Assist Level: Supervision/Verbal cueing   Wheelchair 150 feet activity     Assist      Assist Level: Minimal Assistance - Patient > 75%   Blood pressure (!) 147/65, pulse 73, temperature (!) 97.5 F (36.4 C), resp. rate 20, height 5\' 9"  (1.753 m), weight 91.9 kg, SpO2 99 %.  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 Antithrombotics: -DVT/anticoagulation:Lovenox -antiplatelet therapy: Aspirin 81 mg daily and Plavix 75 mg daily x3 weeks then Plavix alone 3. Pain Management:Tylenol as needed- ? R hip pain , cannot reproduce PT will monitor while pt in therapy  4. Mood:Resume Lexapro 10 mg daily -antipsychotic agents: N/A 5. Neuropsych: This patientiscapable of  making decisions on hisown behalf. 6. Skin/Wound Care:Routine skin checks 7. Fluids/Electrolytes/Nutrition:Routine in and outs with follow-up chemistries 8. Dysphagia. Carb modified nectar thick liquid.   Upgrade to reg diet , thin liq, d/c nocturnal IVF 9. Diabetes mellitus with obesity,peripheral neuropathy. Hemoglobin A1c 8.5.   -Home regimen:NovoLog 12units 3 times daily, Lantus insulin 40units twice daily.  -1/9 sugars remain elevated     CBG (last 3)  Recent Labs    04/26/20 0605 04/26/20 0623 04/26/20 0647  GLUCAP 55* 58* 87   .On Victoza at home ask pt to resume   Needs bedtime snack 10. AKI on CKD stage III. Baseline creatinine 1.1-1.4. Cr normal at 1.12 on 1.14, repeat weekly 11. Permissive hypertension. Presently on Toprol-XL 25 mg daily. Patient on Norvasc 10 mg nightly, Cozaar 100 mg daily, Imdur 30 mg nightly prior to admission.   Vitals:   04/25/20 1951 04/26/20 0535  BP: (!) 143/72 (!) 147/65  Pulse: 71 73  Resp: 18 20  Temp: 98.5 F (36.9 C) (!) 97.5 F (36.4 C)  SpO2: 97% 99%   cont. amlodipine 10mg - controlled 1/26 12. Hyperlipidemia. Continue Zetia/Crestor 13. CAD with stenting. Continue aspirin and Plavix 14. Left BKA 2015. Patient does have a prosthesis which appears to be in working order but is due for replacement soon  per pt([employer])  15.  Constipation senna S 2 po TID,  increase sorbitol Urinary retention requiring straight cath, place foleyand f/u Uro      LOS: 19 days A FACE TO FACE EVALUATION WAS PERFORMED  Erick Colace 04/26/2020, 9:16 AM

## 2020-04-28 ENCOUNTER — Telehealth: Payer: Self-pay | Admitting: Registered Nurse

## 2020-04-28 NOTE — Telephone Encounter (Signed)
Transitional Care call Transitional Questions answered by Mrs. Judithann Graves  Patient name: Jordan [name] DOB: [birth year] 1. Are you/is patient experiencing any problems since coming home? No a. Are there any questions regarding any aspect of care? No 2. Are there any questions regarding medications administration/dosing? No a. Are meds being taken as prescribed? Yes b. "Patient should review meds with caller to confirm" Medication List Reviewed. 3. Have there been any falls? No 4. Has Home Health been to the house and/or have they contacted you? Yes: [facility] a. If not, have you tried to contact them? NA b. Can we help you contact them? NA 5. Are bowels and bladder emptying properly? Ms. Jeanlouis reports Mr. Coll hasn't had a bowel movement since discharge, she gave him Miralax today. Will continue to monitor. Voiding via foley catheter. a. Are there any unexpected incontinence issues? No b. If applicable, is patient following bowel/bladder programs? NA 6. Any fevers, problems with breathing, unexpected pain? No 7. Are there any skin problems or new areas of breakdown? No 8. Has the patient/family member arranged specialty MD follow up (ie cardiology/neurology/renal/surgical/etc.)?  HFU appointments are scheduled.  a. Can we help arrange? NA 9. Does the patient need any other services or support that we can help arrange? No 10. Are caregivers following through as expected in assisting the patient? Yes 11. Has the patient quit smoking, drinking alcohol, or using drugs as recommended? (                        )  Appointment date/time 05/08/2020  arrival time 12:40 for 1:00 appointment with Jones Bales ANP-C. At  [address] [employer] [address]

## 2020-05-08 ENCOUNTER — Encounter: Payer: Medicare PPO | Attending: Registered Nurse | Admitting: Registered Nurse

## 2020-05-08 ENCOUNTER — Other Ambulatory Visit: Payer: Self-pay

## 2020-05-08 VITALS — BP 125/72 | HR 82 | Temp 98.2°F

## 2020-05-08 DIAGNOSIS — R339 Retention of urine, unspecified: Secondary | ICD-10-CM | POA: Diagnosis not present

## 2020-05-08 DIAGNOSIS — I639 Cerebral infarction, unspecified: Secondary | ICD-10-CM | POA: Insufficient documentation

## 2020-05-08 DIAGNOSIS — I10 Essential (primary) hypertension: Secondary | ICD-10-CM | POA: Insufficient documentation

## 2020-05-08 DIAGNOSIS — E1169 Type 2 diabetes mellitus with other specified complication: Secondary | ICD-10-CM

## 2020-05-08 DIAGNOSIS — E669 Obesity, unspecified: Secondary | ICD-10-CM

## 2020-05-08 NOTE — Progress Notes (Signed)
Subjective:    Patient ID: Jordan [name]., male    DOB: [birth year], 80 y.o.   MRN: [id number]  HPI: Jordan [name]. is a 80 y.o. male who is here for Transitional Care Visit for Follow up of his Infarction of Left Basal Ganglia, Essential Hypertension, Diabetes Mellitus type 2 in obese and urinary retention. He presented to [facility] on 03/30/2020 via EMS for evaluation of left-sided facial droop and hyperglycemia. Neurology was consulted.  MR Brain:  IMPRESSION: Small left gangliothalamic acute infarct. No hemorrhage or mass effect. MR Angio:  IMPRESSION: 1. 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  2. Negative for large vessel occlusion. Generally mild intracranial atherosclerosis elsewhere.  3. No evidence of hemodynamically significant ICA or vertebral artery stenosis in the neck.  [name] was maintained on aspirin and plavix x 3 weeks then Plavix alone, per discharge summary: Jordan [name].  [name] was admitted to inpatient rehabilitation on 04/07/2020 and discharged home on 04/26/2020. He is receiving home health therapy with [facility]. He states he is walking with walker with the physical therapist.  He states he has pain in his lower back. He rates his pain 3. Also reports his appetite is poor.   Wife in room, all questions answered.   Pain Inventory Average Pain 3 Pain Right Now 5 My pain is aching  LOCATION OF PAIN back  BOWEL Number of stools per week: 6 Oral laxative use Yes  Type of laxative miralax Enema or suppository use No  History of colostomy No  Incontinent No   BLADDER Foley In and out cath, frequency yes Able to self cath No  Bladder incontinence Yes  Frequent urination No  Leakage with coughing No  Difficulty starting stream No  Incomplete bladder emptying No    Mobility walk with assistance use a walker ability to climb steps?   no do you drive?  no use a wheelchair needs help with transfers  Function retired I need assistance with the following:  feeding, dressing, bathing, toileting, meal prep, household duties and shopping  Neuro/Psych bladder control problems bowel control problems weakness trouble walking confusion depression loss of taste or smell  Prior Studies TC appt  Physicians involved in your care TC appt   Family History  Problem Relation Age of Onset  . Diabetes Mother   . Cancer Mother        Pancreatic  . Heart disease Mother        After age 72  . Hypertension Mother   . Hyperlipidemia Mother   . Diabetes Sister   . Cancer Sister   . Cancer Sister        Bone  . Cancer Father    Social History   Socioeconomic History  . Marital status: Married    Spouse name: Not on file  . Number of children: 1  . Years of education: Not on file  . Highest education level: Not on file  Occupational History  . Not on file  Tobacco Use  . Smoking status: Former Smoker    Types: Pipe    Quit date: 04/02/1979    Years since quitting: 41.1  . Smokeless tobacco: Never Used  Vaping Use  . Vaping Use: Never used  Substance and Sexual Activity  . Alcohol use: No    Alcohol/week: 0.0 standard drinks  . Drug use: No  . Sexual activity: Yes    Birth control/protection: None  Other Topics Concern  . Not on file  Social History Narrative  . Not on file   Social Determinants of Health   Financial Resource Strain: Not on file  Food Insecurity: Not on file  Transportation Needs: Not on file  Physical Activity: Not on file  Stress: Not on file  Social Connections: Not on file   Past Surgical History:  Procedure Laterality Date  . ABDOMINAL AORTAGRAM N/A 04/07/2013   Procedure: ABDOMINAL [name];  Surgeon: [name], MD;  Location: [facility] CATH LAB;  Service: Cardiovascular;  Laterality: N/A;  . ABDOMINAL AORTAGRAM N/A 01/26/2014   Procedure: ABDOMINAL [name];  Surgeon:  [name], MD;  Location: [facility] CATH LAB;  Service: Cardiovascular;  Laterality: N/A;  . ABDOMINAL AORTAGRAM N/A 06/13/2014   Procedure: ABDOMINAL [name];  Surgeon: [name], MD;  Location: [facility] CATH LAB;  Service: Cardiovascular;  Laterality: N/A;  . AMPUTATION Left 01/28/2014   Procedure: AMPUTATION DIGIT- LEFT 5TH TOE;  Surgeon: [name], MD;  Location: [facility] OR;  Service: Vascular;  Laterality: Left;  . AMPUTATION Left 02/02/2014   Procedure: Left 4th Ray Amputation vs Transmetatarsal Amputation;  Surgeon: [name], MD;  Location: [facility] OR;  Service: Orthopedics;  Laterality: Left;  . AMPUTATION Left 03/24/2014   Procedure: AMPUTATION BELOW KNEE;  Surgeon: [name], MD;  Location: [facility] OR;  Service: Orthopedics;  Laterality: Left;  . APPENDECTOMY    . CARDIAC CATHETERIZATION    . CORONARY ANGIOPLASTY WITH STENT PLACEMENT    . FEMORAL-TIBIAL BYPASS GRAFT Left 01/28/2014   Procedure:  FEMORAL-ANTERIOR TIBIAL ARTERY Bypass Graft utilizing composite gortex graft and vein graft;  Surgeon: [name], MD;  Location: [facility] OR;  Service: Vascular;  Laterality: Left;  . LOWER EXTREMITY ANGIOGRAM Left 01/26/2014   Procedure: LOWER EXTREMITY ANGIOGRAM;  Surgeon: [name], MD;  Location: [facility] CATH LAB;  Service: Cardiovascular;  Laterality: Left;  . RIGHT/LEFT HEART CATH AND CORONARY ANGIOGRAPHY N/A 12/02/2017   Procedure: RIGHT/LEFT HEART CATH AND CORONARY ANGIOGRAPHY;  Surgeon: Swaziland, [name], MD;  Location: [facility] INVASIVE CV LAB;  Service: Cardiovascular;  Laterality: N/A;  . TEE WITHOUT CARDIOVERSION N/A 03/28/2014   Procedure: TRANSESOPHAGEAL ECHOCARDIOGRAM (TEE);  Surgeon: Quintella Reichert, MD;  Location: [facility] ENDOSCOPY;  Service: Cardiovascular;  Laterality: N/A;   Past Medical History:  Diagnosis Date  . Anemia   . Aortic stenosis   . CAD (coronary artery disease)    a. s/p IMI in past tx with POBA and cath 6 mos later with occluded RCA;  b. h/o  Taxus DES to LAD and CFX;  c.  cath 1/10: LM ok, mLAD 40-50%, LAD stent ok, D2 tandem 60-70%, pCFX 30%, mAVCFX stent ok, pRCA 40%, RV 90%, RV marginal 90%, dRCA filled L-R collats tx medically  . Carotid stenosis    dopplers 5/11: 40-59% bilat  . CHF (congestive heart failure) (HCC)   . Diabetic ulcer of left foot (HCC)   . DM2 (diabetes mellitus, type 2) (HCC)   . Heart murmur   . HLD (hyperlipidemia)   . HTN (hypertension)   . Myocardial infarction (HCC) 1995 and 2005   Heart Attack  . Osteomyelitis of left foot (HCC)   . Staphylococcus aureus bacteremia with sepsis (HCC)    BP 125/72   Pulse 82   Temp 98.2 F (36.8 C)   SpO2 97%   Opioid Risk Score:   Fall Risk Score:  `1  Depression screen PHQ 2/9  Depression  screen [facility] 2/9 06/26/2016 05/19/2014  Decreased Interest 1 1  Down, Depressed, Hopeless 0 1  PHQ - 2 Score 1 2  Altered sleeping - 0  Tired, decreased energy - 0  Change in appetite - 0  Feeling bad or failure about yourself  - 0  Trouble concentrating - 0  Moving slowly or fidgety/restless - 0  Suicidal thoughts - 0  PHQ-9 Score - 2     Review of Systems  Musculoskeletal: Positive for back pain.  All other systems reviewed and are negative.      Objective:   Physical Exam Vitals and nursing note reviewed.  Constitutional:      Appearance: Normal appearance.  Cardiovascular:     Rate and Rhythm: Normal rate and regular rhythm.     Pulses: Normal pulses.     Heart sounds: Normal heart sounds.  Pulmonary:     Effort: Pulmonary effort is normal.     Breath sounds: Normal breath sounds.  Musculoskeletal:     Cervical back: Normal range of motion and neck supple.     Comments: Normal Muscle Bulk and Muscle Testing Reveals:  Upper Extremities: Decreased ROM 90 Degrees  and Muscle Strength  4/5 Lower Extremities: Left BKA: Wearing Prosthesis Right Lower Extremity: Full ROM and Muscle Strength 5/5 Arrived in wheelchair.   Skin:    General: Skin is warm  and dry.  Neurological:     Mental Status: He is alert and oriented to person, place, and time.  Psychiatric:        Mood and Affect: Mood normal.        Behavior: Behavior normal.           Assessment & Plan:  1. nfarction of Left Basal Ganglia: Continue Home Health Therapy with [facility]. He has an appointment with Neurology. Continue to monitor.  2. Essential Hypertension: Continue current medication regimen. PCP Following.  3. Diabetes Mellitus type 2 in obese: Continue current medication regimen. PCP Following. Continue to monitor.  4. Urinary retention. Foley intact: Call was placed to [facility]: Continue current medication regimen.   F/U with Dr Wynn Banker in 4- 6 weeks

## 2020-05-15 ENCOUNTER — Telehealth: Payer: Self-pay

## 2020-05-15 NOTE — Telephone Encounter (Signed)
Per [name] PT with [facility] Jordan [name] is not very active with his PT. Patient complains of lower back pain at a level 8 out of 10. He spends most of his time in the bed. He is also eating less and drinking less. Because of spending so much  time lying in bed.    Patient is taking Tylenol Arthritis BID. Call back ph Marchelle Folks  [phone]

## 2020-05-16 ENCOUNTER — Encounter: Payer: Self-pay | Admitting: Registered Nurse

## 2020-05-16 NOTE — Telephone Encounter (Signed)
Notified Amanda

## 2020-05-16 NOTE — Telephone Encounter (Signed)
Please have HH call PCP Dr Mikey Bussing

## 2020-05-25 ENCOUNTER — Inpatient Hospital Stay: Payer: No Typology Code available for payment source | Admitting: Adult Health

## 2020-06-13 ENCOUNTER — Encounter: Payer: Medicare PPO | Admitting: Physical Medicine & Rehabilitation

## 2021-03-21 ENCOUNTER — Ambulatory Visit: Payer: Medicare PPO | Admitting: Internal Medicine

## 2022-07-22 IMAGING — MR MR HEAD W/O CM
12 of 13 series · 44 of 48 positions shown · non-contrast
Comparison: None.

CLINICAL DATA: Left-sided facial droop

EXAM:
MRI HEAD WITHOUT CONTRAST
TECHNIQUE: Multiplanar, multiecho pulse sequences of the brain and surrounding
structures were obtained without intravenous contrast.

[Series 5: DWI · axial · 3.0mm · 0.88mm/px · z∈[-65,+80]mm · 8 of 100 slices shown (1 of 4)]
[im 1/100]
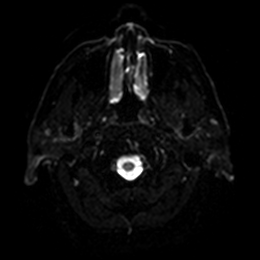
[im 15/100]
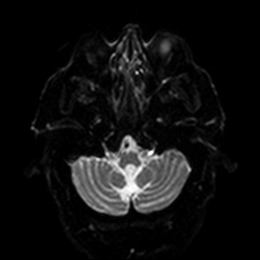
[im 29/100]
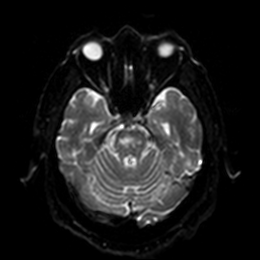
[im 43/100]
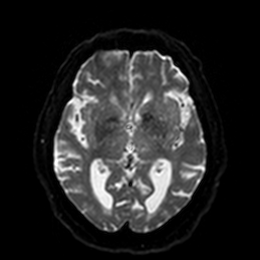
[im 57/100]
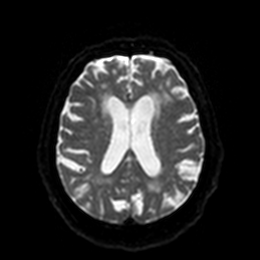
[im 71/100]
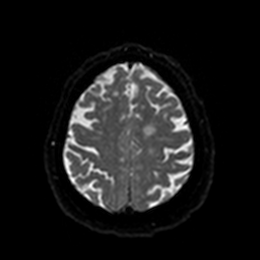
[im 85/100]
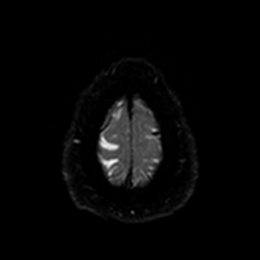
[im 100/100]
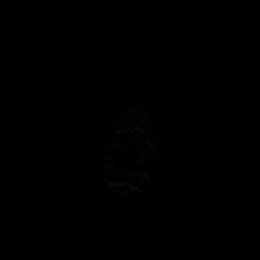

[Series 6: DWI · axial · 3.0mm · 0.88mm/px · z∈[-65,+80]mm · 4 of 50 slices shown (2 of 4)]
[im 1/50]
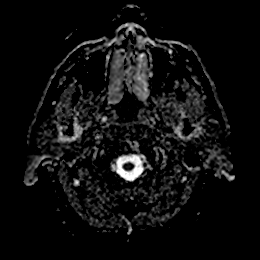
[im 17/50]
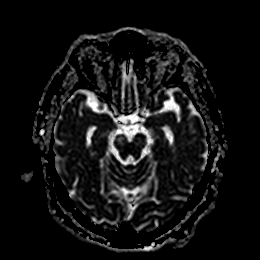
[im 33/50]
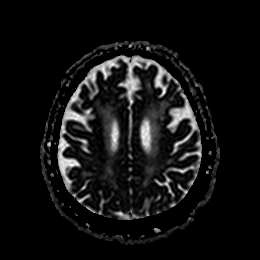
[im 50/50]
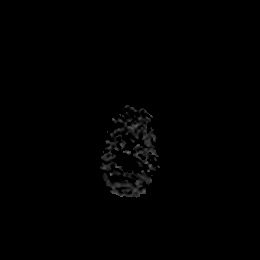

[Series 7: DWI · coronal · 4.0mm · 0.88mm/px · 5 of 66 slices shown (3 of 4)]
[im 1/66]
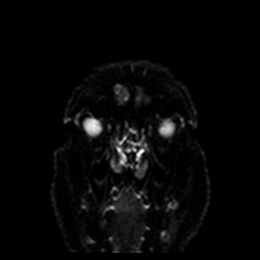
[im 17/66]
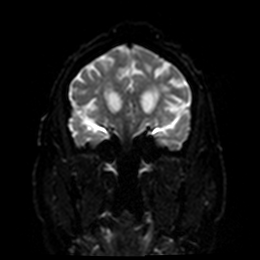
[im 33/66]
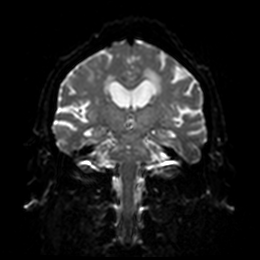
[im 49/66]
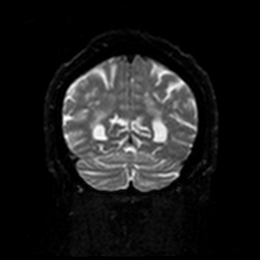
[im 66/66]
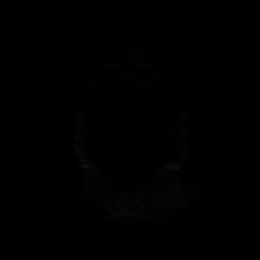

[Series 8: DWI · coronal · 4.0mm · 0.88mm/px · 3 of 33 slices shown (4 of 4)]
[im 1/33]
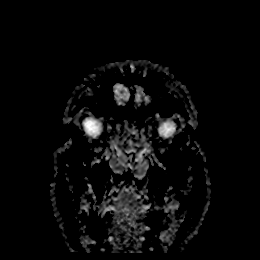
[im 17/33]
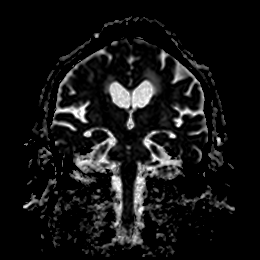
[im 33/33]
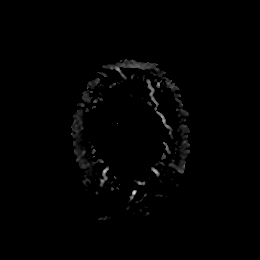

[Series 9: T1 · sagittal · 5.0mm · 0.75mm/px · 2 of 23 slices shown]
[im 1/23]
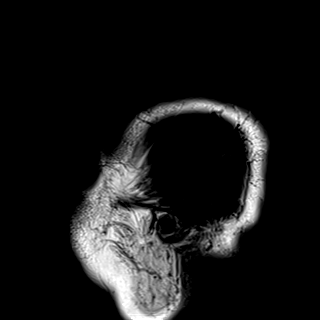
[im 23/23]
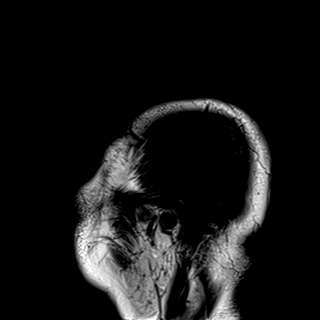

[Series 10: T2 · axial · 5.0mm · 0.72mm/px · z∈[-66,+82]mm · 2 of 26 slices shown (1 of 2)]
[im 1/26]
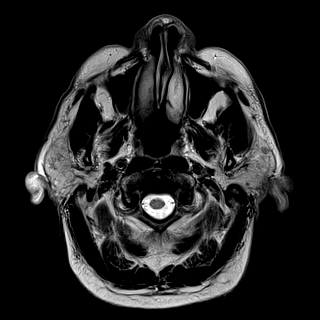
[im 26/26]
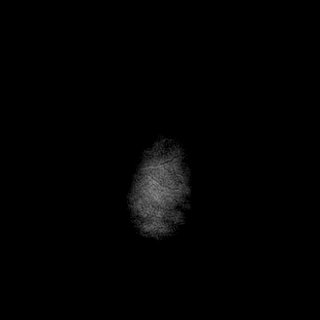

[Series 11: FLAIR · axial · 5.0mm · 0.45mm/px · z∈[-67,+81]mm · 2 of 26 slices shown]
[im 1/26]
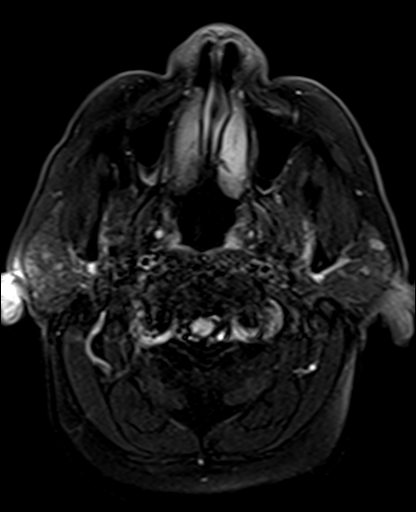
[im 26/26]
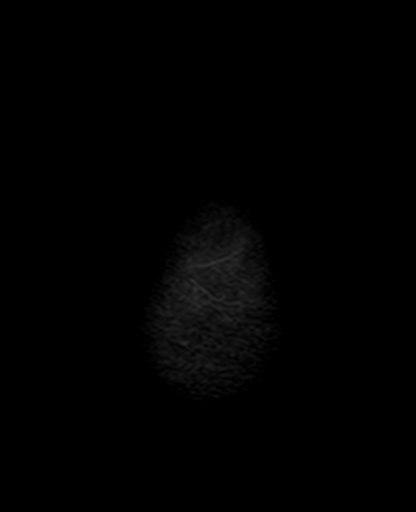

[Series 12: mag_images · axial · 3.0mm · 0.90mm/px · z∈[-68,+82]mm · 4 of 52 slices shown]
[im 1/52]
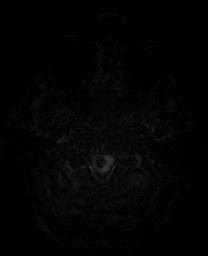
[im 18/52]
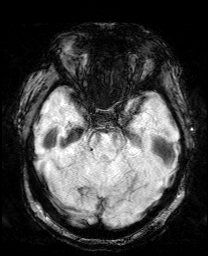
[im 35/52]
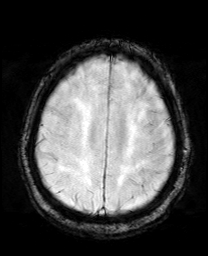
[im 52/52]
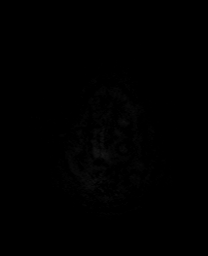

[Series 13: pha_images · axial · 3.0mm · 0.90mm/px · z∈[-68,+82]mm · 4 of 52 slices shown]
[im 1/52]
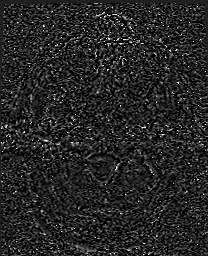
[im 18/52]
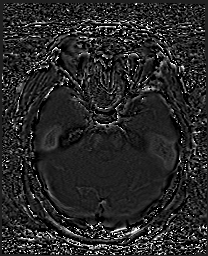
[im 35/52]
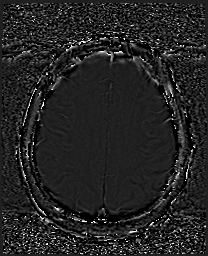
[im 52/52]
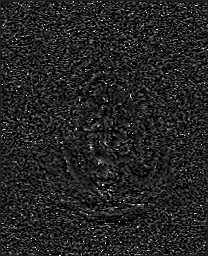

[Series 14: swi_images · axial · 3.0mm · 0.90mm/px · z∈[-68,+82]mm · 4 of 52 slices shown]
[im 1/52]
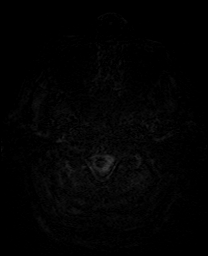
[im 18/52]
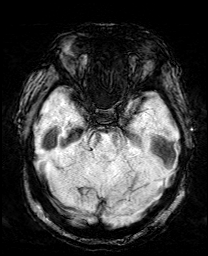
[im 35/52]
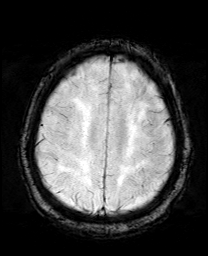
[im 52/52]
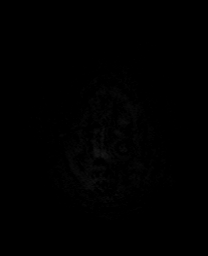

[Series 15: mip_images(sw) · axial · 24.0mm · 0.90mm/px · z∈[-58,+72]mm · 4 of 45 slices shown]
[im 1/45]
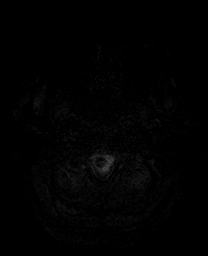
[im 15/45]
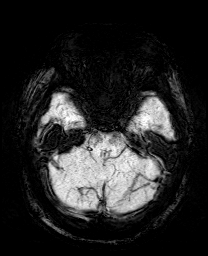
[im 30/45]
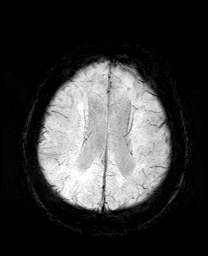
[im 45/45]
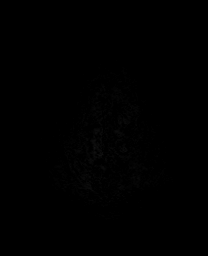

[Series 17: T2 · coronal · 5.0mm · 0.34mm/px · 2 of 29 slices shown (2 of 2)]
[im 1/29]
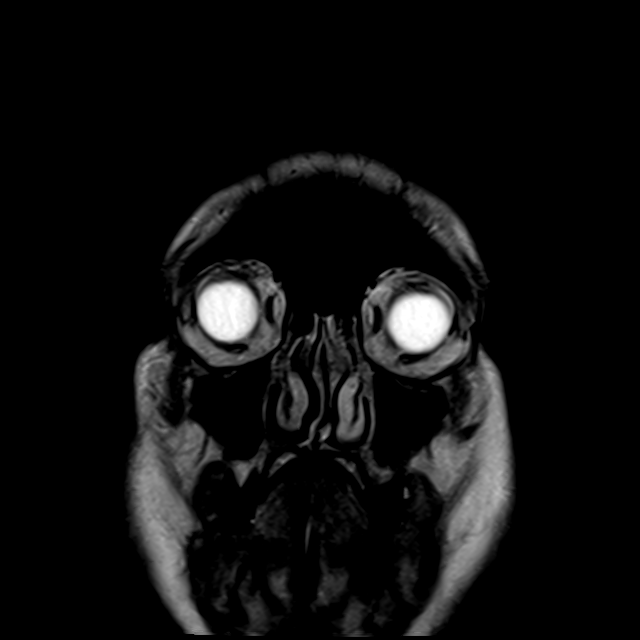
[im 29/29]
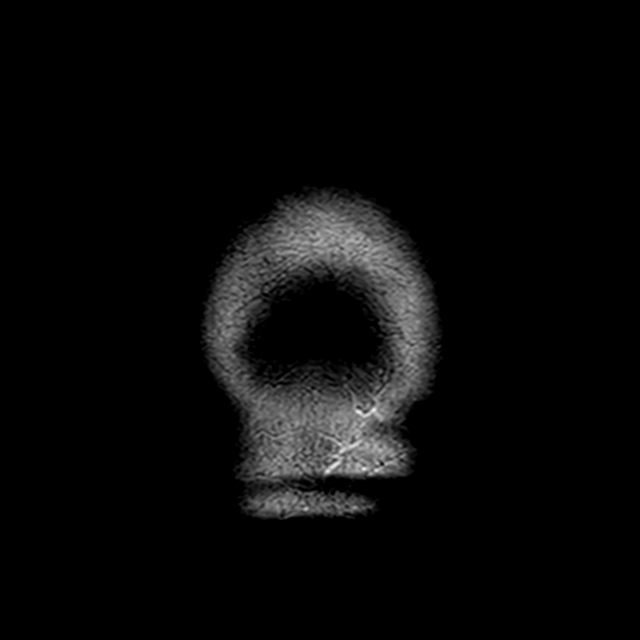

[44 of 48 positions shown; findings below may reference images not displayed]

FINDINGS: Brain: There is a small left gangliothalamic acute infarct. No acute
or chronic hemorrhage. There is multifocal hyperintense T2-weighted
signal within the white matter. Generalized volume loss without a
clear lobar predilection. The midline structures are normal.

Vascular: Major flow voids are preserved.

Skull and upper cervical spine: Normal calvarium and skull base.
Visualized upper cervical spine and soft tissues are normal.

Sinuses/Orbits:No paranasal sinus fluid levels or advanced mucosal
thickening. No mastoid or middle ear effusion. Normal orbits.
IMPRESSION: Small left gangliothalamic acute infarct. No hemorrhage or mass
effect.

## 2024-01-15 ENCOUNTER — Other Ambulatory Visit (HOSPITAL_COMMUNITY): Payer: Self-pay

## 2024-01-15 DIAGNOSIS — C61 Malignant neoplasm of prostate: Secondary | ICD-10-CM

## 2024-01-16 ENCOUNTER — Other Ambulatory Visit (INDEPENDENT_AMBULATORY_CARE_PROVIDER_SITE_OTHER): Payer: Self-pay | Admitting: Vascular Surgery

## 2024-01-16 DIAGNOSIS — I739 Peripheral vascular disease, unspecified: Secondary | ICD-10-CM

## 2024-01-19 ENCOUNTER — Ambulatory Visit (INDEPENDENT_AMBULATORY_CARE_PROVIDER_SITE_OTHER)

## 2024-01-19 ENCOUNTER — Ambulatory Visit (INDEPENDENT_AMBULATORY_CARE_PROVIDER_SITE_OTHER): Admitting: Nurse Practitioner

## 2024-01-19 ENCOUNTER — Encounter (INDEPENDENT_AMBULATORY_CARE_PROVIDER_SITE_OTHER): Payer: Self-pay | Admitting: Nurse Practitioner

## 2024-01-19 VITALS — BP 125/73 | HR 73 | Resp 18 | Ht 69.0 in | Wt 159.0 lb

## 2024-01-19 DIAGNOSIS — L97512 Non-pressure chronic ulcer of other part of right foot with fat layer exposed: Secondary | ICD-10-CM | POA: Diagnosis not present

## 2024-01-19 DIAGNOSIS — E08621 Diabetes mellitus due to underlying condition with foot ulcer: Secondary | ICD-10-CM

## 2024-01-19 DIAGNOSIS — I7025 Atherosclerosis of native arteries of other extremities with ulceration: Secondary | ICD-10-CM | POA: Diagnosis not present

## 2024-01-19 DIAGNOSIS — I739 Peripheral vascular disease, unspecified: Secondary | ICD-10-CM

## 2024-01-19 DIAGNOSIS — I10 Essential (primary) hypertension: Secondary | ICD-10-CM | POA: Diagnosis not present

## 2024-01-19 NOTE — Progress Notes (Signed)
 Per Orvin brown right ankle cleansed with normal saline, Aquacel applied and wrapped with Kerlix, secured with paper tape.   Dois Seip CMA

## 2024-01-19 NOTE — Progress Notes (Addendum)
 Subjective:    Patient ID: Jordan [name]., male    DOB: [birth year], 83 y.o.   MRN: [id number] Chief Complaint  Patient presents with   New Patient (Initial Visit)    ABI + consult. PAD. referring MD Trudy Lye at [facility]     The patient is an 83 year old male who presents today as a referral from the [facility] in regards to a slow healing ulceration on his right lateral ankle area.  He believes that he hit this on his wheelchair to cause the wound itself.  He notes that the wound has been improving without any significant intervention but given the fact that it has been 4 weeks there was underlying concern and sent to vascular for evaluation.  Today he has an ABI 1.01 on the right and 0.49 on the left.  Previously with ABIs done in 2021 showed an ABI 0.77 on the left and 0.49 on the right.  He has monophasic tibial waveforms with noted likely greater than 50% stenosis in the distal popliteal area.    Review of Systems  Musculoskeletal:  Positive for gait problem.  Skin:  Positive for wound.  All other systems reviewed and are negative.      Objective:   Physical Exam Vitals reviewed.  HENT:     Head: Normocephalic.  Cardiovascular:     Rate and Rhythm: Normal rate.     Pulses:          Dorsalis pedis pulses are detected w/ Doppler on the right side.       Posterior tibial pulses are detected w/ Doppler on the right side.  Pulmonary:     Effort: Pulmonary effort is normal.  Musculoskeletal:     Left Lower Extremity: Left leg is amputated below knee.  Skin:    General: Skin is warm and dry.  Neurological:     Mental Status: He is alert and oriented to person, place, and time.     Motor: Weakness present.     Gait: Gait abnormal.  Psychiatric:        Mood and Affect: Mood normal.        Behavior: Behavior normal.        Thought Content: Thought content normal.        Judgment: Judgment normal.     BP 125/73   Pulse 73   Resp 18   Ht 5' 9 (1.753 m)   Wt 159 lb (72.1  kg)   BMI 23.48 kg/m   Past Medical History:  Diagnosis Date   Anemia    Aortic stenosis    CAD (coronary artery disease)    a. s/p IMI in past tx with POBA and cath 6 mos later with occluded RCA;  b. h/o Taxus DES to LAD and CFX;  c.  cath 1/10: LM ok, mLAD 40-50%, LAD stent ok, D2 tandem 60-70%, pCFX 30%, mAVCFX stent ok, pRCA 40%, RV 90%, RV marginal 90%, dRCA filled L-R collats tx medically   Carotid stenosis    dopplers 5/11: 40-59% bilat   CHF (congestive heart failure) (HCC)    Diabetic ulcer of left foot (HCC)    DM2 (diabetes mellitus, type 2) (HCC)    Heart murmur    HLD (hyperlipidemia)    HTN (hypertension)    Myocardial infarction (HCC) 1995 and 2005   Heart Attack   Osteomyelitis of left foot (HCC)    Staphylococcus aureus bacteremia with sepsis [facility])     Social History  Socioeconomic History   Marital status: Married    Spouse name: Not on file   Number of children: 1   Years of education: Not on file   Highest education level: Not on file  Occupational History   Not on file  Tobacco Use   Smoking status: Former    Types: Pipe    Quit date: 04/02/1979    Years since quitting: 44.8   Smokeless tobacco: Never  Vaping Use   Vaping status: Never Used  Substance and Sexual Activity   Alcohol  use: No    Alcohol /week: 0.0 standard drinks of alcohol    Drug use: No   Sexual activity: Yes    Birth control/protection: None  Other Topics Concern   Not on file  Social History Narrative   Not on file   Social Drivers of Health   Financial Resource Strain: Low Risk  (11/10/2023)   Received from [facility]   Overall Financial Resource Strain (CARDIA)    How hard is it for you to pay for the very basics like food, housing, medical care, and heating?: Not hard at all  Food Insecurity: No Food Insecurity (11/10/2023)   Received from [facility]   Hunger Vital Sign    Within the past 12 months, you worried that your food would run out before you got the  money to buy more.: Never true    Within the past 12 months, the food you bought just didn't last and you didn't have money to get more.: Never true  Transportation Needs: No Transportation Needs (11/10/2023)   Received from [facility]   PRAPARE - Transportation    Lack of Transportation (Medical): No    Lack of Transportation (Non-Medical): No  Physical Activity: Inactive (11/11/2023)   Received from [facility]   Exercise Vital Sign    On average, how many days per week do you engage in moderate to strenuous exercise (like a brisk walk)?: 0 days    On average, how many minutes do you engage in exercise at this level?: 0 min  Stress: No Stress Concern Present (11/11/2023)   Received from [facility] - Occupational Stress Questionnaire    Do you feel stress - tense, restless, nervous, or anxious, or unable to sleep at night because your mind is troubled all the time - these days?: Not at all  Social Connections: Socially Integrated (11/11/2023)   Received from [facility]   Social Connection and Isolation Panel    In a typical week, how many times do you talk on the phone with family, friends, or neighbors?: More than three times a week    How often do you get together with friends or relatives?: More than three times a week    How often do you attend church or religious services?: More than 4 times per year    Do you belong to any clubs or organizations such as church groups, unions, fraternal or athletic groups, or school groups?: Yes    How often do you attend meetings of the clubs or organizations you belong to?: More than 4 times per year    Are you married, widowed, divorced, separated, never married, or living with a partner?: Married  Intimate Partner Violence: Not At Risk (11/11/2023)   Received from [facility]   Humiliation, Afraid, Rape, and Kick questionnaire    Within the last year, have you been afraid of your  partner or ex-partner?: No    Within the last year, have you been humiliated or emotionally abused in other ways by your partner or ex-partner?: No    Within the last year, have you been kicked, hit, slapped, or otherwise physically hurt by your partner or ex-partner?: No    Within the last year, have you been raped or forced to have any kind of sexual activity by your partner or ex-partner?: No    Past Surgical History:  Procedure Laterality Date   ABDOMINAL AORTAGRAM N/A 04/07/2013   Procedure: ABDOMINAL EZELLA;  Surgeon: Deatrice DELENA Cage, MD;  Location: [facility] CATH LAB;  Service: Cardiovascular;  Laterality: N/A;   ABDOMINAL AORTAGRAM N/A 01/26/2014   Procedure: ABDOMINAL EZELLA;  Surgeon: Deatrice DELENA Cage, MD;  Location: [facility] CATH LAB;  Service: Cardiovascular;  Laterality: N/A;   ABDOMINAL AORTAGRAM N/A 06/13/2014   Procedure: ABDOMINAL EZELLA;  Surgeon: Lonni GORMAN Blade, MD;  Location: [facility] CATH LAB;  Service: Cardiovascular;  Laterality: N/A;   AMPUTATION Left 01/28/2014   Procedure: AMPUTATION DIGIT- LEFT 5TH TOE;  Surgeon: Lonni GORMAN Blade, MD;  Location[facility] OR;  Service: Vascular;  Laterality: Left;   AMPUTATION Left 02/02/2014   Procedure: Left 4th Ray Amputation vs Transmetatarsal Amputation;  Surgeon: Jerona Harden GAILS, MD;  Location: [facility] OR;  Service: Orthopedics;  Laterality: Left;   AMPUTATION Left 03/24/2014   Procedure: AMPUTATION BELOW KNEE;  Surgeon: Cordella Glendia Hutchinson, MD;  Location: [facility] OR;  Service: Orthopedics;  Laterality: Left;   APPENDECTOMY     BLADDER REPAIR     2024 biopsy done and tumor removed [facility]   CARDIAC CATHETERIZATION     CORONARY ANGIOPLASTY WITH STENT PLACEMENT     FEMORAL-TIBIAL BYPASS GRAFT Left 01/28/2014   Procedure:  FEMORAL-ANTERIOR TIBIAL ARTERY Bypass Graft utilizing composite gortex graft and vein graft;  Surgeon: Lonni GORMAN Blade, MD;  Location: [facility] OR;  Service: Vascular;  Laterality: Left;   LOWER EXTREMITY ANGIOGRAM  Left 01/26/2014   Procedure: LOWER EXTREMITY ANGIOGRAM;  Surgeon: Deatrice DELENA Cage, MD;  Location: [facility] CATH LAB;  Service: Cardiovascular;  Laterality: Left;   RIGHT/LEFT HEART CATH AND CORONARY ANGIOGRAPHY N/A 12/02/2017   Procedure: RIGHT/LEFT HEART CATH AND CORONARY ANGIOGRAPHY;  Surgeon: Swaziland, [name], MD;  Location: [facility] INVASIVE CV LAB;  Service: Cardiovascular;  Laterality: N/A;   TEE WITHOUT CARDIOVERSION N/A 03/28/2014   Procedure: TRANSESOPHAGEAL ECHOCARDIOGRAM (TEE);  Surgeon: Wilbert JONELLE Bihari, MD;  Location: [facility] ENDOSCOPY;  Service: Cardiovascular;  Laterality: N/A;    Family History  Problem Relation Age of Onset   Diabetes Mother    Cancer Mother        Pancreatic   Heart disease Mother        After age 46   Hypertension Mother    Hyperlipidemia Mother    Diabetes Sister    Cancer Sister    Cancer Sister        Bone   Cancer Father     Allergies  Allergen Reactions   Cilostazol  Other (See Comments)    Feels like feet on fire / Pain in feet       Latest Ref Rng & Units 04/24/2020    5:29 AM 04/19/2020    6:47 AM 04/10/2020    6:21 AM  CBC  WBC 4.0 - 10.5 K/uL 8.4  11.6  10.2   Hemoglobin 13.0 - 17.0 g/dL 88.4  87.3  86.9   Hematocrit 39.0 - 52.0 % 34.2  40.2  38.2   Platelets 150 - 400 K/uL 384  446  240       CMP     Component Value Date/Time   NA 133 (L) 04/24/2020 0529   NA 138 10/12/2018 0905   K 3.5 04/24/2020 0529   CL 100 04/24/2020 0529   CO2 24 04/24/2020 0529   GLUCOSE 121 (H) 04/24/2020 0529   BUN 10 04/24/2020 0529   BUN 12 10/12/2018 0905   CREATININE 1.06 04/24/2020 0529   CREATININE 1.27 12/19/2010 1702   CREATININE 1.27 12/19/2010 1702   CALCIUM  8.7 (L) 04/24/2020 0529   PROT 7.3 04/24/2020 0529   PROT 7.1 10/12/2018 0905   ALBUMIN 2.3 (L) 04/24/2020 0529   ALBUMIN 4.1 10/12/2018 0905   AST 46 (H) 04/24/2020 0529   ALT 54 (H) 04/24/2020 0529   ALKPHOS 125 04/24/2020 0529   BILITOT 0.7 04/24/2020 0529   BILITOT 0.3 10/12/2018  0905   GFR 69.12 04/02/2013 0950   GFRNONAA >60 04/24/2020 0529     No results found.     Assessment & Plan:   1. Atherosclerosis of native arteries of the extremities with ulceration (HCC) (Primary) I had a very long discussion with the patient and his wife in regards to his results and how that relates to wound healing.  Based on his results his ability to heal is marginal.  We discussed options including undergoing an angiogram which is what I have recommended.  However, the patient(s) refers that the non-invasive option of monitoring to see if his wound continues to improve.  I have instructed him that if his wound begins to deteriorate or worsen he should contact us  and we will need to move forward with an angiogram at that time.  However if he is healing with proper wound care we will plan on having him return in 4 weeks to evaluate.  2. Essential hypertension Continue antihypertensive medications as already ordered, these medications have been reviewed and there are no changes at this time.  3. Diabetic ulcer of other part of right foot associated with diabetes mellitus due to underlying condition, with fat layer exposed (HCC) At this time we will place Aquacel on the wound however I believe he would be a good candidate for Santyl over this wound.  We have sent the prescription to the specialty pharmacy to obtain Santyl.  They are instructed that the pharmacy will reach out to coordinate delivery.  Given the wound I also think it would be prudent for him to have a wound to help with wound care and monitoring as well.  Will try to facilitate this.   Current Outpatient Medications on File Prior to Visit  Medication Sig Dispense Refill   acetaminophen  (TYLENOL ) 325 MG tablet Take 2 tablets (650 mg total) by mouth every 4 (four) hours as needed for mild pain (or temp > 37.5 C (99.5 F)).     carbamide peroxide (DEBROX) 6.5 % OTIC solution 5 drops 2 (two) times daily.     darolutamide  (NUBEQA) 300 MG tablet Take 600 mg by mouth 2 (two) times daily with a meal.     empagliflozin (JARDIANCE) 25 MG TABS tablet Take 25 mg by mouth daily.     escitalopram  (LEXAPRO ) 10 MG tablet Take 1 tablet (10 mg total) by mouth daily. 30 tablet 0   ezetimibe  (ZETIA ) 10 MG tablet Take 1 tablet (10 mg total) by mouth daily. 90 tablet 3   famotidine (PEPCID) 10 MG tablet Take 10 mg  by mouth 2 (two) times daily.     insulin  aspart (NOVOLOG ) 100 UNIT/ML injection Inject 100 Units into the skin 3 (three) times daily before meals.     insulin  glargine (LANTUS ) 100 UNIT/ML Solostar Pen Inject 30 Units into the skin 2 (two) times daily. 15 mL 11   lidocaine  (LIDODERM ) 5 % Place 1 patch onto the skin daily. Remove & Discard patch within 12 hours or as directed by MD     metoprolol  succinate (TOPROL -XL) 25 MG 24 hr tablet Take 1 tablet (25 mg total) by mouth daily. 30 tablet 0   nitroGLYCERIN  (NITROSTAT ) 0.4 MG SL tablet Place 0.4 mg under the tongue every 5 (five) minutes as needed for chest pain.     nystatin cream (MYCOSTATIN) Apply 1 Application topically 2 (two) times daily.     pantoprazole  (PROTONIX ) 40 MG tablet Take 40 mg by mouth daily.     rosuvastatin  (CRESTOR ) 10 MG tablet Take 10 mg by mouth daily.     rosuvastatin  (CRESTOR ) 40 MG tablet Take 1 tablet (40 mg total) by mouth at bedtime. 90 tablet 3   tamsulosin  (FLOMAX ) 0.4 MG CAPS capsule Take 1 capsule (0.4 mg total) by mouth daily after supper. 30 capsule 0   Vitamin D, Ergocalciferol, (DRISDOL) 1.25 MG (50000 UNIT) CAPS capsule Take 50,000 Units by mouth every 30 (thirty) days. On the 1st Monday of each month     amLODipine  (NORVASC ) 5 MG tablet Take 1 tablet (5 mg total) by mouth daily. 30 tablet 0   amoxicillin -clavulanate (AUGMENTIN ) 875-125 MG tablet Take 1 tablet by mouth every 12 (twelve) hours. 14 tablet 0   bethanechol  (URECHOLINE ) 25 MG tablet Take 1 tablet (25 mg total) by mouth 3 (three) times daily. 21 tablet 0   clopidogrel   (PLAVIX ) 75 MG tablet Take 1 tablet (75 mg total) by mouth daily. 30 tablet 1   cyanocobalamin  (,VITAMIN B-12,) 1000 MCG/ML injection Inject 1,000 mcg into the muscle every 30 (thirty) days.     cyanocobalamin  1000 MCG tablet Take 1 tablet (1,000 mcg total) by mouth daily. 30 tablet 0   furosemide (LASIX) 20 MG tablet Take 40 mg by mouth daily as needed for fluid or edema.     hydrocortisone 2.5%-nystatin-zinc oxide 20% 1:1:1 ointment mixture Apply 1 Application topically. (Patient not taking: Reported on 01/19/2024)     isosorbide  mononitrate (IMDUR ) 30 MG 24 hr tablet Take 1 tablet (30 mg total) by mouth at bedtime. 30 tablet 0   polyethylene glycol (MIRALAX  / GLYCOLAX ) 17 g packet Take 17 g by mouth 2 (two) times daily. 14 each 0   No current facility-administered medications on file prior to visit.    There are no Patient Instructions on file for this visit. No follow-ups on file.   Cris Gibby E Jahnae Mcadoo, NP

## 2024-01-20 ENCOUNTER — Telehealth (INDEPENDENT_AMBULATORY_CARE_PROVIDER_SITE_OTHER): Payer: Self-pay

## 2024-01-20 ENCOUNTER — Other Ambulatory Visit (INDEPENDENT_AMBULATORY_CARE_PROVIDER_SITE_OTHER): Payer: Self-pay | Admitting: Nurse Practitioner

## 2024-01-20 ENCOUNTER — Other Ambulatory Visit: Payer: Self-pay | Admitting: Nephrology

## 2024-01-20 DIAGNOSIS — N1831 Chronic kidney disease, stage 3a: Secondary | ICD-10-CM

## 2024-01-20 DIAGNOSIS — D649 Anemia, unspecified: Secondary | ICD-10-CM

## 2024-01-20 DIAGNOSIS — R809 Proteinuria, unspecified: Secondary | ICD-10-CM

## 2024-01-20 DIAGNOSIS — E08621 Diabetes mellitus due to underlying condition with foot ulcer: Secondary | ICD-10-CM

## 2024-01-20 NOTE — Telephone Encounter (Addendum)
 Due to [facility] not able to cover the patient location a referral has been sent to [facility] home health for wound care. I left a message on the patient spouse voicemail.

## 2024-01-20 NOTE — Telephone Encounter (Signed)
 Referral for wound care orders has been sent to [facility]. Patient will be reach out to within 48 hours from [facility]. I left a message on patient voicemail.

## 2024-01-20 NOTE — Telephone Encounter (Signed)
 Jordan [name] with [facility] called back in reference to patient getting home health and stated that [facility] does not cover [place] part of the [place]), he checked the [place] and [place] offices at this time.

## 2024-01-21 ENCOUNTER — Encounter (HOSPITAL_COMMUNITY)
Admission: RE | Admit: 2024-01-21 | Discharge: 2024-01-21 | Disposition: A | Discharge status: Home / Self Care | Source: Ambulatory Visit

## 2024-01-21 DIAGNOSIS — C61 Malignant neoplasm of prostate: Secondary | ICD-10-CM | POA: Insufficient documentation

## 2024-01-21 LAB — VAS US ABI WITH/WO TBI: Right ABI: 1.01

## 2024-01-21 MED ORDER — FLOTUFOLASTAT F 18 GALLIUM 296-5846 MBQ/ML IV SOLN
8.3600 | Freq: Once | INTRAVENOUS | Status: AC
  Administered 2024-01-21: 8.36 via INTRAVENOUS
  Filled 2024-01-21: qty 9

## 2024-01-23 ENCOUNTER — Telehealth (INDEPENDENT_AMBULATORY_CARE_PROVIDER_SITE_OTHER): Payer: Self-pay

## 2024-01-23 ENCOUNTER — Ambulatory Visit

## 2024-01-23 NOTE — Telephone Encounter (Deleted)
 Patient's wife left message on nurse line stating that they have received correspondance from [facility] for his ointment that it has been approved but denied, she reports this should have been sent to

## 2024-01-23 NOTE — Telephone Encounter (Addendum)
 Patient's wife left message on nurse line stating that they have received correspondance from [facility] for his ointment that it has been approved but denied, she reports this should have been sent to the [facility] at [place] to avoid delays and for coverage. She further reports they have not yet heard anything from [facility]. Call to clarify pharmacy location Patient reports it is the [facility] in [place]

## 2024-01-23 NOTE — Telephone Encounter (Signed)
  error

## 2024-01-23 NOTE — Telephone Encounter (Addendum)
 Returned call to patient spouse notifying that I have contacted  [facility] and [facility] home health and both are not able to staff the location. I have sent referral to [facility] and I am just waiting on a response. Patient spouse verbalized understanding

## 2024-01-26 NOTE — Telephone Encounter (Signed)
 [place]  from [facility] called in reference to request for patient to receive home health from the agency, she stated the nurse just quit 2 days ago and there will not be another nurse in the [place] area for 2 weeks. [place]  stated [facility] will have to decline patient at this time due to lack of coverage.

## 2024-01-26 NOTE — Telephone Encounter (Signed)
 Referral fax sent over to [facility] at this time.

## 2024-01-26 NOTE — Telephone Encounter (Signed)
 Sending in a prescription to the [facility] tends to be a somewhat headache of a process.  Can we call the patient's wife and get [facility] with [facility] and [facility] to contact them about this [facility].

## 2024-01-27 ENCOUNTER — Telehealth (INDEPENDENT_AMBULATORY_CARE_PROVIDER_SITE_OTHER): Payer: Self-pay

## 2024-01-27 NOTE — Telephone Encounter (Signed)
 Patient referral has been accepted with [facility] for wound care and the office will reach out to the patient.

## 2024-01-27 NOTE — Telephone Encounter (Signed)
 Rx on your desk, if you can fax to [facility] and my office note so the Santyl can be filled there

## 2024-01-27 NOTE — Telephone Encounter (Signed)
 done

## 2024-02-19 ENCOUNTER — Ambulatory Visit
Admission: RE | Admit: 2024-02-19 | Discharge: 2024-02-19 | Disposition: A | Discharge status: Home / Self Care | Source: Ambulatory Visit | Attending: Nurse Practitioner | Admitting: Nurse Practitioner

## 2024-02-19 ENCOUNTER — Encounter (INDEPENDENT_AMBULATORY_CARE_PROVIDER_SITE_OTHER): Payer: Self-pay | Admitting: Nurse Practitioner

## 2024-02-19 ENCOUNTER — Ambulatory Visit
Admission: RE | Admit: 2024-02-19 | Discharge: 2024-02-19 | Disposition: A | Source: Ambulatory Visit | Attending: Nurse Practitioner

## 2024-02-19 ENCOUNTER — Other Ambulatory Visit (INDEPENDENT_AMBULATORY_CARE_PROVIDER_SITE_OTHER): Payer: Self-pay | Admitting: Nurse Practitioner

## 2024-02-19 ENCOUNTER — Ambulatory Visit (INDEPENDENT_AMBULATORY_CARE_PROVIDER_SITE_OTHER): Admitting: Nurse Practitioner

## 2024-02-19 VITALS — BP 93/57 | HR 80 | Resp 18 | Wt 163.0 lb

## 2024-02-19 DIAGNOSIS — L97512 Non-pressure chronic ulcer of other part of right foot with fat layer exposed: Secondary | ICD-10-CM | POA: Insufficient documentation

## 2024-02-19 DIAGNOSIS — I10 Essential (primary) hypertension: Secondary | ICD-10-CM | POA: Diagnosis not present

## 2024-02-19 DIAGNOSIS — E08621 Diabetes mellitus due to underlying condition with foot ulcer: Secondary | ICD-10-CM

## 2024-02-19 DIAGNOSIS — E11622 Type 2 diabetes mellitus with other skin ulcer: Secondary | ICD-10-CM | POA: Insufficient documentation

## 2024-02-19 DIAGNOSIS — I739 Peripheral vascular disease, unspecified: Secondary | ICD-10-CM

## 2024-02-21 ENCOUNTER — Encounter (INDEPENDENT_AMBULATORY_CARE_PROVIDER_SITE_OTHER): Payer: Self-pay | Admitting: Nurse Practitioner

## 2024-02-21 NOTE — Progress Notes (Signed)
 SUBJECTIVE:  Patient ID: Jordan Cecille Raddle., male    DOB: February 14, 1941, 83 y.o.   MRN: [id number] Chief Complaint  Patient presents with   Follow-up    4 week follow up    Discussed the use of AI scribe software for clinical note transcription with the patient, who gave verbal consent to proceed.  History of Present Illness Jordan Gallo. is an 83 year old male with a history of previous left bka who presents for follow-up on a foot wound.  He has a foot wound near the ankle, measuring approximately 3 cm by 3.5 cm. The wound is being managed with Santal ointment and the assistance of a home health nurse visiting three times a week. There is some drainage present, but no significant pain is reported.  He has a history of a previous amputation, which raises concerns about potential infection and the need for vigilant monitoring. Blood sugars have been running a little high, which could be related to an infection, although no outward signs of infection are currently noted.     Results    Past Medical History:  Diagnosis Date   Anemia    Aortic stenosis    CAD (coronary artery disease)    a. s/p IMI in past tx with POBA and cath 6 mos later with occluded RCA;  b. h/o Taxus DES to LAD and CFX;  c.  cath 1/10: LM ok, mLAD 40-50%, LAD stent ok, D2 tandem 60-70%, pCFX 30%, mAVCFX stent ok, pRCA 40%, RV 90%, RV marginal 90%, dRCA filled L-R collats tx medically   Carotid stenosis    dopplers 5/11: 40-59% bilat   CHF (congestive heart failure) (HCC)    Diabetic ulcer of left foot (HCC)    DM2 (diabetes mellitus, type 2) (HCC)    Heart murmur    HLD (hyperlipidemia)    HTN (hypertension)    Myocardial infarction (HCC) 1995 and 2005   Heart Attack   Osteomyelitis of left foot (HCC)    Staphylococcus aureus bacteremia with sepsis [facility])     Past Surgical History:  Procedure Laterality Date   ABDOMINAL AORTAGRAM N/A 04/07/2013   Procedure: ABDOMINAL EZELLA;  Surgeon: Deatrice DELENA Cage, MD;  Location: [facility] CATH LAB;  Service: Cardiovascular;  Laterality: N/A;   ABDOMINAL AORTAGRAM N/A 01/26/2014   Procedure: ABDOMINAL EZELLA;  Surgeon: Deatrice DELENA Cage, MD;  Location: [facility] CATH LAB;  Service: Cardiovascular;  Laterality: N/A;   ABDOMINAL AORTAGRAM N/A 06/13/2014   Procedure: ABDOMINAL EZELLA;  Surgeon: Lonni GORMAN Blade, MD;  Location: [facility] CATH LAB;  Service: Cardiovascular;  Laterality: N/A;   AMPUTATION Left 01/28/2014   Procedure: AMPUTATION DIGIT- LEFT 5TH TOE;  Surgeon: Lonni GORMAN Blade, MD;  Location: [facility] OR;  Service: Vascular;  Laterality: Left;   AMPUTATION Left 02/02/2014   Procedure: Left 4th Ray Amputation vs Transmetatarsal Amputation;  Surgeon: Jerona Harden GAILS, MD;  Location: [facility] OR;  Service: Orthopedics;  Laterality: Left;   AMPUTATION Left 03/24/2014   Procedure: AMPUTATION BELOW KNEE;  Surgeon: Cordella Glendia Hutchinson, MD;  Location: [facility] OR;  Service: Orthopedics;  Laterality: Left;   APPENDECTOMY     BLADDER REPAIR     2024 biopsy done and tumor removed [facility]   CARDIAC CATHETERIZATION     CORONARY ANGIOPLASTY WITH STENT PLACEMENT     FEMORAL-TIBIAL BYPASS GRAFT Left 01/28/2014   Procedure:  FEMORAL-ANTERIOR TIBIAL ARTERY Bypass Graft utilizing composite gortex graft and vein graft;  Surgeon: Lonni GORMAN Blade, MD;  Location: [facility] OR;  Service: Vascular;  Laterality: Left;   LOWER EXTREMITY ANGIOGRAM Left 01/26/2014   Procedure: LOWER EXTREMITY ANGIOGRAM;  Surgeon: Deatrice DELENA Cage, MD;  Location: [facility] CATH LAB;  Service: Cardiovascular;  Laterality: Left;   RIGHT/LEFT HEART CATH AND CORONARY ANGIOGRAPHY N/A 12/02/2017   Procedure: RIGHT/LEFT HEART CATH AND CORONARY ANGIOGRAPHY;  Surgeon: Jordan, [name], MD;  Location: [facility] INVASIVE CV LAB;  Service: Cardiovascular;  Laterality: N/A;   TEE WITHOUT CARDIOVERSION N/A 03/28/2014   Procedure: TRANSESOPHAGEAL ECHOCARDIOGRAM (TEE);  Surgeon: Wilbert JONELLE Bihari, MD;  Location: [facility];  Service:  Cardiovascular;  Laterality: N/A;    Social History   Socioeconomic History   Marital status: Married    Spouse name: Not on file   Number of children: 1   Years of education: Not on file   Highest education level: Not on file  Occupational History   Not on file  Tobacco Use   Smoking status: Former    Types: Pipe    Quit date: 04/02/1979    Years since quitting: 44.9   Smokeless tobacco: Never  Vaping Use   Vaping status: Never Used  Substance and Sexual Activity   Alcohol  use: No    Alcohol /week: 0.0 standard drinks of alcohol    Drug use: No   Sexual activity: Yes    Birth control/protection: None  Other Topics Concern   Not on file  Social History Narrative   Not on file   Social Drivers of Health   Financial Resource Strain: Low Risk (11/10/2023)   Received from [facility]   Overall Financial Resource Strain (CARDIA)    How hard is it for you to pay for the very basics like food, housing, medical care, and heating?: Not hard at all  Food Insecurity: No Food Insecurity (11/10/2023)   Received from [facility]   Hunger Vital Sign    Within the past 12 months, you worried that your food would run out before you got the money to buy more.: Never true    Within the past 12 months, the food you bought just didn't last and you didn't have money to get more.: Never true  Transportation Needs: No Transportation Needs (11/10/2023)   Received from [facility]   PRAPARE - Transportation    Lack of Transportation (Medical): No    Lack of Transportation (Non-Medical): No  Physical Activity: Inactive (11/11/2023)   Received from [facility]   Exercise Vital Sign    On average, how many days per week do you engage in moderate to strenuous exercise (like a brisk walk)?: 0 days    On average, how many minutes do you engage in exercise at this level?: 0 min  Stress: No Stress Concern Present (11/11/2023)   Received from [facility]  Occupational Health - Occupational Stress Questionnaire    Do you feel stress - tense, restless, nervous, or anxious, or unable to sleep at night because your mind is troubled all the time - these days?: Not at all  Social Connections: Socially Integrated (11/11/2023)   Received from [facility]   Social Connection and Isolation Panel    In a typical week, how many times do you talk on the phone with family, friends, or neighbors?: More than three times a week    How often do you get together with friends or relatives?: More than three times a week    How often do  you attend church or religious services?: More than 4 times per year    Do you belong to any clubs or organizations such as church groups, unions, fraternal or athletic groups, or school groups?: Yes    How often do you attend meetings of the clubs or organizations you belong to?: More than 4 times per year    Are you married, widowed, divorced, separated, never married, or living with a partner?: Married  Intimate Partner Violence: Not At Risk (11/11/2023)   Received from [facility]   Humiliation, Afraid, Rape, and Kick questionnaire    Within the last year, have you been afraid of your partner or ex-partner?: No    Within the last year, have you been humiliated or emotionally abused in other ways by your partner or ex-partner?: No    Within the last year, have you been kicked, hit, slapped, or otherwise physically hurt by your partner or ex-partner?: No    Within the last year, have you been raped or forced to have any kind of sexual activity by your partner or ex-partner?: No    Family History  Problem Relation Age of Onset   Diabetes Mother    Cancer Mother        Pancreatic   Heart disease Mother        After age 19   Hypertension Mother    Hyperlipidemia Mother    Diabetes Sister    Cancer Sister    Cancer Sister        Bone   Cancer Father     Allergies  Allergen Reactions   Cilostazol  Other (See  Comments)    Feels like feet on fire / Pain in feet     Review of Systems   Review of Systems: Negative Unless Checked Constitutional: [] Weight loss  [] Fever  [] Chills Cardiac: [] Chest pain   []  Atrial Fibrillation  [] Palpitations   [] Shortness of breath when laying flat   [] Shortness of breath with exertion. [] Shortness of breath at rest Vascular:  [] Pain in legs with walking   [] Pain in legs with standing [] Pain in legs when laying flat   [] Claudication    [] Pain in feet when laying flat    [] History of DVT   [] Phlebitis   [] Swelling in legs   [] Varicose veins   [] Non-healing ulcers Pulmonary:   [] Uses home oxygen   [] Productive cough   [] Hemoptysis   [] Wheeze  [] COPD   [] Asthma Neurologic:  [] Dizziness   [] Seizures  [] Blackouts [] History of stroke   [] History of TIA  [] Aphasia   [] Temporary Blindness   [] Weakness or numbness in arm   [x] Weakness or numbness in leg Musculoskeletal:   [] Joint swelling   [] Joint pain   [] Low back pain  []  History of Knee Replacement [] Arthritis [] back Surgeries  []  Spinal Stenosis    Hematologic:  [] Easy bruising  [] Easy bleeding   [] Hypercoagulable state   [] Anemic Gastrointestinal:  [] Diarrhea   [] Vomiting  [] Gastroesophageal reflux/heartburn   [] Difficulty swallowing. [] Abdominal pain Genitourinary:  [] Chronic kidney disease   [] Difficult urination  [] Anuric   [] Blood in urine [] Frequent urination  [] Burning with urination   [] Hematuria Skin:  [] Rashes   [x] Ulcers [] Wounds Psychological:  [] History of anxiety   []  History of major depression  []  Memory Difficulties      OBJECTIVE:     BP (!) 93/57 (BP Location: Left Arm)   Pulse 80   Resp 18   Wt 163 lb (73.9 kg)   BMI 24.07  kg/m   Physical Exam Physical Exam EXTREMITIES: Drainage present on foot wound. No tunneling or acute infection of foot wound. Foot wound measures 3 cm by 3.5 cm. Tissue debriding with pink coloration on the outside. Fibrinous exudate present in foot wound.   CMP      Component Value Date/Time   NA 133 (L) 04/24/2020 0529   NA 138 10/12/2018 0905   K 3.5 04/24/2020 0529   CL 100 04/24/2020 0529   CO2 24 04/24/2020 0529   GLUCOSE 121 (H) 04/24/2020 0529   BUN 10 04/24/2020 0529   BUN 12 10/12/2018 0905   CREATININE 1.06 04/24/2020 0529   CREATININE 1.27 12/19/2010 1702   CREATININE 1.27 12/19/2010 1702   CALCIUM  8.7 (L) 04/24/2020 0529   PROT 7.3 04/24/2020 0529   PROT 7.1 10/12/2018 0905   ALBUMIN 2.3 (L) 04/24/2020 0529   ALBUMIN 4.1 10/12/2018 0905   AST 46 (H) 04/24/2020 0529   ALT 54 (H) 04/24/2020 0529   ALKPHOS 125 04/24/2020 0529   BILITOT 0.7 04/24/2020 0529   BILITOT 0.3 10/12/2018 0905   GFR 69.12 04/02/2013 0950   GFRNONAA >60 04/24/2020 0529    VAS US  ABI WITH/WO TBI Result Date: 01/21/2024  LOWER EXTREMITY DOPPLER STUDY Patient Name:  Jordan Statz.  Date of Exam:   01/19/2024 Medical Rec #: [id number]          Accession #:    [id number] Date of Birth: Apr 21, 1940          Patient Gender: M Patient Age:   30 years Exam Location:  [facility] Procedure:      VAS US  ABI WITH/WO TBI Referring Phys: [facility] --------------------------------------------------------------------------------  Indications: Peripheral artery disease. left BKA  Performing Technologist: Elsie Churn RT, RDMS, RVT  Examination Guidelines: A complete evaluation includes at minimum, Doppler waveform signals and systolic blood pressure reading at the level of bilateral brachial, anterior tibial, and posterior tibial arteries, when vessel segments are accessible. Bilateral testing is considered an integral part of a complete examination. Photoelectric Plethysmograph (PPG) waveforms and toe systolic pressure readings are included as required and additional duplex testing as needed. Limited examinations for reoccurring indications may be performed as noted.  ABI Findings: +----------------+---------------+-----+------------------+--------------------+  Right           Rt Pressure    IndexWaveform          Comment                              (mmHg)                                                     +----------------+---------------+-----+------------------+--------------------+ Brachial        151                                                        +----------------+---------------+-----+------------------+--------------------+ SFA mid                             monophasic        >50% stenosis  by                                                           duplex               +----------------+---------------+-----+------------------+--------------------+ Popliteal distal                    monophasic        possible >50%                                                              stenosis by duplex   +----------------+---------------+-----+------------------+--------------------+ PERO            158            1.01 monophasic                             +----------------+---------------+-----+------------------+--------------------+ DP              121            0.77 dampened                                                                   monophasic                             +----------------+---------------+-----+------------------+--------------------+ Great Toe       77             0.49                                        +----------------+---------------+-----+------------------+--------------------+ +--------+------------------+-----+--------+-------+ Left    Lt Pressure (mmHg)IndexWaveformComment +--------+------------------+-----+--------+-------+ Jordan [name]                                    +--------+------------------+-----+--------+-------+ +-------+-----------+-----------+------------+------------+ ABI/TBIToday's ABIToday's TBIPrevious ABIPrevious TBI +-------+-----------+-----------+------------+------------+ Right  1.01       0.49       0.77         0.49         +-------+-----------+-----------+------------+------------+ Left   BKA                   BKA                      +-------+-----------+-----------+------------+------------+  Unable to adequately compare to the previous exam on 05/19/19 due to today's exam pressures being unreliable likely due to medial calcification. Summary: Right: The right toe-brachial index is abnormal. Although ankle brachial indices are within normal limits (0.95-1.29), arterial Doppler waveforms at the ankle suggest some component of arterial occlusive disease.  Focal Doppler velocity increases at the mid SFA and distal popliteal arteries suggestive of >50% stenosis. *See table(s) above for measurements and observations.  Electronically signed by Cordella Shawl MD on 01/21/2024 at 2:55:22 PM.    Final        ASSESSMENT AND PLAN:  1. Diabetic ulcer of other part of right foot associated with diabetes mellitus due to underlying condition, with fat layer exposed (HCC) (Primary) Diabetic foot ulcer Ulcer near ankle improving with debridement and Santal ointment. No tunneling or acute infection. Risk of infection due to amputation history. - Ordered wound culture to rule out infection. - Continue Santal ointment application. - Ordered x-ray to assess for bone changes or osteomyelitis. - Consider angiogram if ulcer enlarges or x-ray indicates bone changes. - Scheduled follow-up in four weeks to reassess wound healing. - Instructed to report any signs of deterioration or worsening of the ulcer. - DG Ankle Complete Left; Future - DG Ankle Complete Right; Future  2. PAD (peripheral artery disease) Patient with low threshold for angiogram as noted above.  If there are any changes to the wound or wound healing delays suspected we will likely need to proceed with angiogram.  3. Essential hypertension Continue antihypertensive medications as already ordered, these medications have been reviewed and there are no  changes at this time.    Current Outpatient Medications on File Prior to Visit  Medication Sig Dispense Refill   acetaminophen  (TYLENOL ) 325 MG tablet Take 2 tablets (650 mg total) by mouth every 4 (four) hours as needed for mild pain (or temp > 37.5 C (99.5 F)).     carbamide peroxide (DEBROX) 6.5 % OTIC solution 5 drops 2 (two) times daily.     empagliflozin (JARDIANCE) 25 MG TABS tablet Take 25 mg by mouth daily.     escitalopram  (LEXAPRO ) 10 MG tablet Take 1 tablet (10 mg total) by mouth daily. 30 tablet 0   ezetimibe  (ZETIA ) 10 MG tablet Take 1 tablet (10 mg total) by mouth daily. 90 tablet 3   ferrous sulfate 325 (65 FE) MG EC tablet Take 325 mg by mouth daily with breakfast.     insulin  aspart (NOVOLOG ) 100 UNIT/ML injection Inject 100 Units into the skin 3 (three) times daily before meals.     insulin  glargine (LANTUS ) 100 UNIT/ML Solostar Pen Inject 30 Units into the skin 2 (two) times daily. 15 mL 11   lidocaine  (LIDODERM ) 5 % Place 1 patch onto the skin daily. Remove & Discard patch within 12 hours or as directed by MD     metoprolol  succinate (TOPROL -XL) 25 MG 24 hr tablet Take 1 tablet (25 mg total) by mouth daily. 30 tablet 0   nitroGLYCERIN  (NITROSTAT ) 0.4 MG SL tablet Place 0.4 mg under the tongue every 5 (five) minutes as needed for chest pain.     nystatin cream (MYCOSTATIN) Apply 1 Application topically 2 (two) times daily.     pantoprazole  (PROTONIX ) 40 MG tablet Take 40 mg by mouth daily.     rosuvastatin  (CRESTOR ) 10 MG tablet Take 10 mg by mouth daily.     SANTYL 250 UNIT/GM ointment Apply topically daily.     tamsulosin  (FLOMAX ) 0.4 MG CAPS capsule Take 1 capsule (0.4 mg total) by mouth daily after supper. 30 capsule 0   amLODipine  (NORVASC ) 5 MG tablet Take 1 tablet (5 mg total) by mouth daily. 30 tablet 0   amoxicillin -clavulanate (AUGMENTIN ) 875-125 MG tablet Take 1 tablet by mouth every 12 (twelve) hours. 14 tablet  0   bethanechol  (URECHOLINE ) 25 MG tablet Take 1  tablet (25 mg total) by mouth 3 (three) times daily. 21 tablet 0   clopidogrel  (PLAVIX ) 75 MG tablet Take 1 tablet (75 mg total) by mouth daily. 30 tablet 1   cyanocobalamin  (,VITAMIN B-12,) 1000 MCG/ML injection Inject 1,000 mcg into the muscle every 30 (thirty) days.     cyanocobalamin  1000 MCG tablet Take 1 tablet (1,000 mcg total) by mouth daily. 30 tablet 0   darolutamide (NUBEQA) 300 MG tablet Take 600 mg by mouth 2 (two) times daily with a meal.     famotidine (PEPCID) 10 MG tablet Take 10 mg by mouth 2 (two) times daily.     furosemide (LASIX) 20 MG tablet Take 40 mg by mouth daily as needed for fluid or edema.     hydrocortisone 2.5%-nystatin-zinc oxide 20% 1:1:1 ointment mixture Apply 1 Application topically. (Patient not taking: Reported on 02/19/2024)     isosorbide  mononitrate (IMDUR ) 30 MG 24 hr tablet Take 1 tablet (30 mg total) by mouth at bedtime. 30 tablet 0   polyethylene glycol (MIRALAX  / GLYCOLAX ) 17 g packet Take 17 g by mouth 2 (two) times daily. 14 each 0   rosuvastatin  (CRESTOR ) 40 MG tablet Take 1 tablet (40 mg total) by mouth at bedtime. 90 tablet 3   Vitamin D, Ergocalciferol, (DRISDOL) 1.25 MG (50000 UNIT) CAPS capsule Take 50,000 Units by mouth every 30 (thirty) days. On the 1st Monday of each month     No current facility-administered medications on file prior to visit.    There are no Patient Instructions on file for this visit. No follow-ups on file.   Breezie Micucci E Avraham Benish, NP  This note was completed with Office Manager.  Any errors are purely unintentional.

## 2024-02-25 ENCOUNTER — Other Ambulatory Visit (INDEPENDENT_AMBULATORY_CARE_PROVIDER_SITE_OTHER): Payer: Self-pay | Admitting: Nurse Practitioner

## 2024-02-25 ENCOUNTER — Ambulatory Visit (INDEPENDENT_AMBULATORY_CARE_PROVIDER_SITE_OTHER): Payer: Self-pay | Admitting: Nurse Practitioner

## 2024-02-25 LAB — AEROBIC CULTURE

## 2024-02-25 MED ORDER — SULFAMETHOXAZOLE-TRIMETHOPRIM 800-160 MG PO TABS: 1.0000 | ORAL_TABLET | Freq: Two times a day (BID) | ORAL | 0 refills | Status: AC

## 2024-02-25 NOTE — Progress Notes (Signed)
 Based on his culture he needs some antibiotics.  Can we see if they would be ok [facility] so that he can get it sooner?

## 2024-02-25 NOTE — Progress Notes (Signed)
 Patient and spouse has been notified with culture results and requested antibiotics to sent to [facility]. Provider has been giving information

## 2024-03-19 ENCOUNTER — Encounter (INDEPENDENT_AMBULATORY_CARE_PROVIDER_SITE_OTHER): Payer: Self-pay | Admitting: Nurse Practitioner

## 2024-03-19 ENCOUNTER — Ambulatory Visit (INDEPENDENT_AMBULATORY_CARE_PROVIDER_SITE_OTHER): Payer: Self-pay | Admitting: Nurse Practitioner

## 2024-03-19 VITALS — BP 117/69 | HR 83 | Resp 17 | Ht 69.0 in | Wt 160.4 lb

## 2024-03-19 DIAGNOSIS — Z89512 Acquired absence of left leg below knee: Secondary | ICD-10-CM

## 2024-03-19 DIAGNOSIS — E08621 Diabetes mellitus due to underlying condition with foot ulcer: Secondary | ICD-10-CM

## 2024-03-19 DIAGNOSIS — L97512 Non-pressure chronic ulcer of other part of right foot with fat layer exposed: Secondary | ICD-10-CM

## 2024-03-19 DIAGNOSIS — E7800 Pure hypercholesterolemia, unspecified: Secondary | ICD-10-CM

## 2024-03-19 DIAGNOSIS — I10 Essential (primary) hypertension: Secondary | ICD-10-CM

## 2024-03-28 ENCOUNTER — Encounter (INDEPENDENT_AMBULATORY_CARE_PROVIDER_SITE_OTHER): Payer: Self-pay | Admitting: Nurse Practitioner

## 2024-03-28 NOTE — H&P (View-Only) (Signed)
 "  Subjective:    Patient ID: Jordan Cecille Raddle., male    DOB: Feb 06, 1941, 83 y.o.   MRN: [id number] Chief Complaint  Patient presents with   Follow-up     4 weeks no studies    HPI  Discussed the use of AI scribe software for clinical note transcription with the patient, who gave verbal consent to proceed.  History of Present Illness Jordan Baney. is an 83 year old male with peripheral arterial disease, prior below-knee amputation, and active malignancy who presents for evaluation of a chronic non-healing lower limb ulcer.  He has a chronic lower limb ulcer with minimal improvement over several months, persistent fibrinous exudate, and no reduction in wound size. The wound remains large, and the patient notes darkening of the periwound skin. He denies pain at the wound site and has not observed bone exposure. The wound is kept bandaged, with regular dressing changes performed by a home health nurse.  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 He expresses apprehension regarding infection risk and possible progression to additional amputation, given the chronicity and lack of improvement of the wound.  He is currently undergoing chemotherapy for malignancy, recently transitioning from oral agents to infusion therapy after loss of efficacy of oral medications. The oncology team is aware of the chronic wound.  He has chronic kidney disease and follows a low potassium diet, with a nephrology appointment scheduled later in the month. He is not on dialysis. He also has peripheral neuropathy, including facial involvement, but currently does not experience pain from this. He notes darkening of the skin in the affected area but is unsure of the cause.  He and his caregiver are considering referral to a wound care specialist for further evaluation, as recommended by his home health nurse. He has  previously benefited from wound care at a local center. Insurance coverage through [place] and [place] may affect referral processes and timing for specialty care.    Results Radiology Lower extremity X-ray: No evidence of osteomyelitis or osseous changes suggestive of osteomyelitis   Review of Systems  Skin:  Positive for wound.  Neurological:  Positive for weakness.  All other systems reviewed and are negative.      Objective:   Physical Exam Vitals reviewed.  HENT:     Head: Normocephalic.  Cardiovascular:     Rate and Rhythm: Normal rate.  Pulmonary:     Effort: Pulmonary effort is normal.  Musculoskeletal:     Left Lower Extremity: Left leg is amputated below knee.  Skin:    General: Skin is warm and dry.  Neurological:     Mental Status: He is alert and oriented to person, place, and time.     Motor: Weakness present.     Gait: Gait abnormal.  Psychiatric:        Mood and Affect: Mood normal.        Behavior: Behavior normal.        Thought Content: Thought content normal.        Judgment: Judgment normal.     Physical Exam SKIN: Fibrinous exudate present, wound not healing as expected. Wound size unchanged, no granulation tissue.  BP 117/69   Pulse 83   Resp 17   Ht 5' 9 (1.753 m)   Wt 160 lb 6.4 oz (72.8 kg)   BMI 23.69 kg/m   Past Medical History:  Diagnosis  Date   Anemia    Aortic stenosis    CAD (coronary artery disease)    a. s/p IMI in past tx with POBA and cath 6 mos later with occluded RCA;  b. h/o Taxus DES to LAD and CFX;  c.  cath 1/10: LM ok, mLAD 40-50%, LAD stent ok, D2 tandem 60-70%, pCFX 30%, mAVCFX stent ok, pRCA 40%, RV 90%, RV marginal 90%, dRCA filled L-R collats tx medically   Carotid stenosis    dopplers 5/11: 40-59% bilat   CHF (congestive heart failure) (HCC)    Diabetic ulcer of left foot (HCC)    DM2 (diabetes mellitus, type 2) (HCC)    Heart murmur    HLD (hyperlipidemia)    HTN (hypertension)    Myocardial infarction  (HCC) 1995 and 2005   Heart Attack   Osteomyelitis of left foot (HCC)    Staphylococcus aureus bacteremia with sepsis (HCC)     Social History   Socioeconomic History   Marital status: Married    Spouse name: Not on file   Number of children: 1   Years of education: Not on file   Highest education level: Not on file  Occupational History   Not on file  Tobacco Use   Smoking status: Former    Types: Pipe    Quit date: 04/02/1979    Years since quitting: 45.0   Smokeless tobacco: Never  Vaping Use   Vaping status: Never Used  Substance and Sexual Activity   Alcohol  use: No    Alcohol /week: 0.0 standard drinks of alcohol    Drug use: No   Sexual activity: Yes    Birth control/protection: None  Other Topics Concern   Not on file  Social History Narrative   Not on file   Social Drivers of Health   Tobacco Use: Medium Risk (03/19/2024)   Patient History    Smoking Tobacco Use: Former    Smokeless Tobacco Use: Never    Passive Exposure: Not on Actuary Strain: Low Risk (11/10/2023)   Received from [facility]   Overall Financial Resource Strain (CARDIA)    How hard is it for you to pay for the very basics like food, housing, medical care, and heating?: Not hard at all  Food Insecurity: No Food Insecurity (11/10/2023)   Received from [facility]   Epic    Within the past 12 months, you worried that your food would run out before you got the money to buy more.: Never true    Within the past 12 months, the food you bought just didn't last and you didn't have money to get more.: Never true  Transportation Needs: No Transportation Needs (11/10/2023)   Received from [facility]   PRAPARE - Transportation    Lack of Transportation (Medical): No    Lack of Transportation (Non-Medical): No  Physical Activity: Inactive (11/11/2023)   Received from [facility]   Exercise Vital Sign    On average, how many days per week do you engage in moderate to  strenuous exercise (like a brisk walk)?: 0 days    On average, how many minutes do you engage in exercise at this level?: 0 min  Stress: No Stress Concern Present (11/11/2023)   Received from [facility] - Occupational Stress Questionnaire    Do you feel stress - tense, restless, nervous, or anxious, or unable to sleep at night because your mind  is troubled all the time - these days?: Not at all  Social Connections: Socially Integrated (11/11/2023)   Received from [facility]   Social Connection and Isolation Panel    In a typical week, how many times do you talk on the phone with family, friends, or neighbors?: More than three times a week    How often do you get together with friends or relatives?: More than three times a week    How often do you attend church or religious services?: More than 4 times per year    Do you belong to any clubs or organizations such as church groups, unions, fraternal or athletic groups, or school groups?: Yes    How often do you attend meetings of the clubs or organizations you belong to?: More than 4 times per year    Are you married, widowed, divorced, separated, never married, or living with a partner?: Married  Intimate Partner Violence: Not At Risk (11/11/2023)   Received from [facility]   Epic    Within the last year, have you been afraid of your partner or ex-partner?: No    Within the last year, have you been humiliated or emotionally abused in other ways by your partner or ex-partner?: No    Within the last year, have you been kicked, hit, slapped, or otherwise physically hurt by your partner or ex-partner?: No    Within the last year, have you been raped or forced to have any kind of sexual activity by your partner or ex-partner?: No  Depression (PHQ2-9): Not on file  Alcohol  Screen: Not on file  Housing: Low Risk (05/14/2023)   Received from [facility]    In the last 12 months, was  there a time when you were not able to pay the mortgage or rent on time?: No    In the past 12 months, how many times have you moved where you were living?: 0    At any time in the past 12 months, were you homeless or living in a shelter (including now)?: No  Utilities: Low Risk (11/10/2023)   Received from [facility]   Utilities    Within the past 12 months, have you been unable to get utilities(heat, electricity) when it was really needed?: No  Health Literacy: Low Risk (11/11/2023)   Received from [facility]    How often do you need to have someone help you when you read instructions, pamphlets, or other written material from your doctor or pharmacy?: Never    Past Surgical History:  Procedure Laterality Date   ABDOMINAL AORTAGRAM N/A 04/07/2013   Procedure: ABDOMINAL EZELLA;  Surgeon: Deatrice DELENA Cage, MD;  Location: [facility] CATH LAB;  Service: Cardiovascular;  Laterality: N/A;   ABDOMINAL AORTAGRAM N/A 01/26/2014   Procedure: ABDOMINAL EZELLA;  Surgeon: Deatrice DELENA Cage, MD;  Location: [facility] CATH LAB;  Service: Cardiovascular;  Laterality: N/A;   ABDOMINAL AORTAGRAM N/A 06/13/2014   Procedure: ABDOMINAL EZELLA;  Surgeon: Lonni GORMAN Blade, MD;  Location: [facility] CATH LAB;  Service: Cardiovascular;  Laterality: N/A;   AMPUTATION Left 01/28/2014   Procedure: AMPUTATION DIGIT- LEFT 5TH TOE;  Surgeon: Lonni GORMAN Blade, MD;  Location: [facility] OR;  Service: Vascular;  Laterality: Left;   AMPUTATION Left 02/02/2014   Procedure: Left 4th Ray Amputation vs Transmetatarsal Amputation;  Surgeon: Jerona Harden GAILS, MD;  Location: [facility] OR;  Service: Orthopedics;  Laterality: Left;   AMPUTATION Left  03/24/2014   Procedure: AMPUTATION BELOW KNEE;  Surgeon: Cordella Glendia Hutchinson, MD;  Location: [facility] OR;  Service: Orthopedics;  Laterality: Left;   APPENDECTOMY     BLADDER REPAIR     2024 biopsy done and tumor removed [facility]   CARDIAC CATHETERIZATION     CORONARY ANGIOPLASTY  WITH STENT PLACEMENT     FEMORAL-TIBIAL BYPASS GRAFT Left 01/28/2014   Procedure:  FEMORAL-ANTERIOR TIBIAL ARTERY Bypass Graft utilizing composite gortex graft and vein graft;  Surgeon: Lonni GORMAN Blade, MD;  Location: [facility] OR;  Service: Vascular;  Laterality: Left;   LOWER EXTREMITY ANGIOGRAM Left 01/26/2014   Procedure: LOWER EXTREMITY ANGIOGRAM;  Surgeon: Deatrice DELENA Cage, MD;  Location: [facility] CATH LAB;  Service: Cardiovascular;  Laterality: Left;   RIGHT/LEFT HEART CATH AND CORONARY ANGIOGRAPHY N/A 12/02/2017   Procedure: RIGHT/LEFT HEART CATH AND CORONARY ANGIOGRAPHY;  Surgeon: Jordan, [name], MD;  Location: [facility] INVASIVE CV LAB;  Service: Cardiovascular;  Laterality: N/A;   TEE WITHOUT CARDIOVERSION N/A 03/28/2014   Procedure: TRANSESOPHAGEAL ECHOCARDIOGRAM (TEE);  Surgeon: Wilbert JONELLE Bihari, MD;  Location: [facility] ENDOSCOPY;  Service: Cardiovascular;  Laterality: N/A;    Family History  Problem Relation Age of Onset   Diabetes Mother    Cancer Mother        Pancreatic   Heart disease Mother        After age 55   Hypertension Mother    Hyperlipidemia Mother    Diabetes Sister    Cancer Sister    Cancer Sister        Bone   Cancer Father     Allergies[1]     Latest Ref Rng & Units 04/24/2020    5:29 AM 04/19/2020    6:47 AM 04/10/2020    6:21 AM  CBC  WBC 4.0 - 10.5 K/uL 8.4  11.6  10.2   Hemoglobin 13.0 - 17.0 g/dL 88.4  87.3  86.9   Hematocrit 39.0 - 52.0 % 34.2  40.2  38.2   Platelets 150 - 400 K/uL 384  446  240       CMP     Component Value Date/Time   NA 133 (L) 04/24/2020 0529   NA 138 10/12/2018 0905   K 3.5 04/24/2020 0529   CL 100 04/24/2020 0529   CO2 24 04/24/2020 0529   GLUCOSE 121 (H) 04/24/2020 0529   BUN 10 04/24/2020 0529   BUN 12 10/12/2018 0905   CREATININE 1.06 04/24/2020 0529   CREATININE 1.27 12/19/2010 1702   CREATININE 1.27 12/19/2010 1702   CALCIUM  8.7 (L) 04/24/2020 0529   PROT 7.3 04/24/2020 0529   PROT 7.1 10/12/2018 0905   ALBUMIN 2.3  (L) 04/24/2020 0529   ALBUMIN 4.1 10/12/2018 0905   AST 46 (H) 04/24/2020 0529   ALT 54 (H) 04/24/2020 0529   ALKPHOS 125 04/24/2020 0529   BILITOT 0.7 04/24/2020 0529   BILITOT 0.3 10/12/2018 0905   GFR 69.12 04/02/2013 0950   GFRNONAA >60 04/24/2020 0529     No results found.     Assessment & Plan:   1. Diabetic ulcer of other part of right foot associated with diabetes mellitus due to underlying condition, with fat layer exposed (HCC) (Primary) Chronic lower limb ulcer with peripheral arterial disease Chronic non-healing ulcer with fibrinous exudate and no granulation tissue, linked to peripheral arterial disease and prior amputation. Impaired perfusion likely hinders healing, with risks of infection, osteomyelitis, and further limb loss. Cancer treatment and comorbidities may delay  healing. Delayed intervention risks non-salvageable wound. - Referred to wound care specialist for evaluation and possible mechanical debridement. - Discussed angiogram to assess and potentially improve blood flow, with 85% success rate but no guarantee of healing. Risks include bleeding, anesthesia complications, and possible need for further surgery. Planned angiogram if wound care does not improve or as recommended by specialist. - Planned repeat vascular studies in six weeks to reassess perfusion and wound status. - Advised to contact clinic if angiogram is recommended or decision made to proceed before next follow-up. - Continued home health nursing visits for wound care. - Provided education on wound bed moisture and healing process. - Ambulatory referral to Wound Clinic  2. Essential hypertension Continue antihypertensive medications as already ordered, these medications have been reviewed and there are no changes at this time.  3. Pure hypercholesterolemia Continue statin as ordered and reviewed, no changes at this time    4. History of below-knee amputation of left lower extremity  (HCC) Below knee amputation increases risk for further limb loss if ulcer does not heal. Preservation of remaining limb function is a priority. Chronicity and lack of improvement raise concern for infection or need for additional amputation. - Coordinated care with wound care specialist and vascular studies to optimize limb preservation. - Discussed risks of delayed intervention, including infection and need for more extensive amputation if healing does not improve.      Medications Ordered Prior to Encounter[2]  There are no Patient Instructions on file for this visit. No follow-ups on file.   Orvin FORBES Daring, NP      [1]  Allergies Allergen Reactions   Cilostazol  Other (See Comments)    Feels like feet on fire / Pain in feet  [2]  Current Outpatient Medications on File Prior to Visit  Medication Sig Dispense Refill   carbamide peroxide (DEBROX) 6.5 % OTIC solution 5 drops 2 (two) times daily.     empagliflozin (JARDIANCE) 25 MG TABS tablet Take 25 mg by mouth daily.     escitalopram  (LEXAPRO ) 10 MG tablet Take 1 tablet (10 mg total) by mouth daily. 30 tablet 0   ezetimibe  (ZETIA ) 10 MG tablet Take 1 tablet (10 mg total) by mouth daily. 90 tablet 3   famotidine (PEPCID) 10 MG tablet Take 10 mg by mouth 2 (two) times daily.     ferrous sulfate 325 (65 FE) MG EC tablet Take 325 mg by mouth daily with breakfast.     insulin  aspart (NOVOLOG ) 100 UNIT/ML injection Inject 100 Units into the skin 3 (three) times daily before meals.     insulin  glargine (LANTUS ) 100 UNIT/ML Solostar Pen Inject 30 Units into the skin 2 (two) times daily. 15 mL 11   lidocaine  (LIDODERM ) 5 % Place 1 patch onto the skin daily. Remove & Discard patch within 12 hours or as directed by MD     metoprolol  succinate (TOPROL -XL) 25 MG 24 hr tablet Take 1 tablet (25 mg total) by mouth daily. 30 tablet 0   nitroGLYCERIN  (NITROSTAT ) 0.4 MG SL tablet Place 0.4 mg under the tongue every 5 (five) minutes as needed for  chest pain.     nystatin cream (MYCOSTATIN) Apply 1 Application topically 2 (two) times daily.     pantoprazole  (PROTONIX ) 40 MG tablet Take 40 mg by mouth daily.     rosuvastatin  (CRESTOR ) 10 MG tablet Take 10 mg by mouth daily.     rosuvastatin  (CRESTOR ) 40 MG tablet Take 1 tablet (40 mg total) by  mouth at bedtime. 90 tablet 3   SANTYL 250 UNIT/GM ointment Apply topically daily.     sulfamethoxazole -trimethoprim  (BACTRIM  DS) 800-160 MG tablet Take 1 tablet by mouth 2 (two) times daily. 20 tablet 0   tamsulosin  (FLOMAX ) 0.4 MG CAPS capsule Take 1 capsule (0.4 mg total) by mouth daily after supper. 30 capsule 0   Vitamin D, Ergocalciferol, (DRISDOL) 1.25 MG (50000 UNIT) CAPS capsule Take 50,000 Units by mouth every 30 (thirty) days. On the 1st Monday of each month     acetaminophen  (TYLENOL ) 325 MG tablet Take 2 tablets (650 mg total) by mouth every 4 (four) hours as needed for mild pain (or temp > 37.5 C (99.5 F)).     amLODipine  (NORVASC ) 5 MG tablet Take 1 tablet (5 mg total) by mouth daily. 30 tablet 0   amoxicillin -clavulanate (AUGMENTIN ) 875-125 MG tablet Take 1 tablet by mouth every 12 (twelve) hours. 14 tablet 0   bethanechol  (URECHOLINE ) 25 MG tablet Take 1 tablet (25 mg total) by mouth 3 (three) times daily. 21 tablet 0   clopidogrel  (PLAVIX ) 75 MG tablet Take 1 tablet (75 mg total) by mouth daily. 30 tablet 1   cyanocobalamin  (,VITAMIN B-12,) 1000 MCG/ML injection Inject 1,000 mcg into the muscle every 30 (thirty) days.     cyanocobalamin  1000 MCG tablet Take 1 tablet (1,000 mcg total) by mouth daily. 30 tablet 0   darolutamide (NUBEQA) 300 MG tablet Take 600 mg by mouth 2 (two) times daily with a meal.     furosemide (LASIX) 20 MG tablet Take 40 mg by mouth daily as needed for fluid or edema.     hydrocortisone 2.5%-nystatin-zinc oxide 20% 1:1:1 ointment mixture Apply 1 Application topically. (Patient not taking: Reported on 02/19/2024)     isosorbide  mononitrate (IMDUR ) 30 MG 24 hr  tablet Take 1 tablet (30 mg total) by mouth at bedtime. 30 tablet 0   polyethylene glycol (MIRALAX  / GLYCOLAX ) 17 g packet Take 17 g by mouth 2 (two) times daily. 14 each 0   No current facility-administered medications on file prior to visit.   "

## 2024-03-28 NOTE — Progress Notes (Signed)
 "  Subjective:    Patient ID: Jordan Cecille Raddle., male    DOB: Feb 06, 1941, 83 y.o.   MRN: [id number] Chief Complaint  Patient presents with   Follow-up     4 weeks no studies    HPI  Discussed the use of AI scribe software for clinical note transcription with the patient, who gave verbal consent to proceed.  History of Present Illness Jordan Baney. is an 83 year old male with peripheral arterial disease, prior below-knee amputation, and active malignancy who presents for evaluation of a chronic non-healing lower limb ulcer.  He has a chronic lower limb ulcer with minimal improvement over several months, persistent fibrinous exudate, and no reduction in wound size. The wound remains large, and the patient notes darkening of the periwound skin. He denies pain at the wound site and has not observed bone exposure. The wound is kept bandaged, with regular dressing changes performed by a home health nurse.  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 He expresses apprehension regarding infection risk and possible progression to additional amputation, given the chronicity and lack of improvement of the wound.  He is currently undergoing chemotherapy for malignancy, recently transitioning from oral agents to infusion therapy after loss of efficacy of oral medications. The oncology team is aware of the chronic wound.  He has chronic kidney disease and follows a low potassium diet, with a nephrology appointment scheduled later in the month. He is not on dialysis. He also has peripheral neuropathy, including facial involvement, but currently does not experience pain from this. He notes darkening of the skin in the affected area but is unsure of the cause.  He and his caregiver are considering referral to a wound care specialist for further evaluation, as recommended by his home health nurse. He has  previously benefited from wound care at a local center. Insurance coverage through [place] and [place] may affect referral processes and timing for specialty care.    Results Radiology Lower extremity X-ray: No evidence of osteomyelitis or osseous changes suggestive of osteomyelitis   Review of Systems  Skin:  Positive for wound.  Neurological:  Positive for weakness.  All other systems reviewed and are negative.      Objective:   Physical Exam Vitals reviewed.  HENT:     Head: Normocephalic.  Cardiovascular:     Rate and Rhythm: Normal rate.  Pulmonary:     Effort: Pulmonary effort is normal.  Musculoskeletal:     Left Lower Extremity: Left leg is amputated below knee.  Skin:    General: Skin is warm and dry.  Neurological:     Mental Status: He is alert and oriented to person, place, and time.     Motor: Weakness present.     Gait: Gait abnormal.  Psychiatric:        Mood and Affect: Mood normal.        Behavior: Behavior normal.        Thought Content: Thought content normal.        Judgment: Judgment normal.     Physical Exam SKIN: Fibrinous exudate present, wound not healing as expected. Wound size unchanged, no granulation tissue.  BP 117/69   Pulse 83   Resp 17   Ht 5' 9 (1.753 m)   Wt 160 lb 6.4 oz (72.8 kg)   BMI 23.69 kg/m   Past Medical History:  Diagnosis  Date   Anemia    Aortic stenosis    CAD (coronary artery disease)    a. s/p IMI in past tx with POBA and cath 6 mos later with occluded RCA;  b. h/o Taxus DES to LAD and CFX;  c.  cath 1/10: LM ok, mLAD 40-50%, LAD stent ok, D2 tandem 60-70%, pCFX 30%, mAVCFX stent ok, pRCA 40%, RV 90%, RV marginal 90%, dRCA filled L-R collats tx medically   Carotid stenosis    dopplers 5/11: 40-59% bilat   CHF (congestive heart failure) (HCC)    Diabetic ulcer of left foot (HCC)    DM2 (diabetes mellitus, type 2) (HCC)    Heart murmur    HLD (hyperlipidemia)    HTN (hypertension)    Myocardial infarction  (HCC) 1995 and 2005   Heart Attack   Osteomyelitis of left foot (HCC)    Staphylococcus aureus bacteremia with sepsis (HCC)     Social History   Socioeconomic History   Marital status: Married    Spouse name: Not on file   Number of children: 1   Years of education: Not on file   Highest education level: Not on file  Occupational History   Not on file  Tobacco Use   Smoking status: Former    Types: Pipe    Quit date: 04/02/1979    Years since quitting: 45.0   Smokeless tobacco: Never  Vaping Use   Vaping status: Never Used  Substance and Sexual Activity   Alcohol  use: No    Alcohol /week: 0.0 standard drinks of alcohol    Drug use: No   Sexual activity: Yes    Birth control/protection: None  Other Topics Concern   Not on file  Social History Narrative   Not on file   Social Drivers of Health   Tobacco Use: Medium Risk (03/19/2024)   Patient History    Smoking Tobacco Use: Former    Smokeless Tobacco Use: Never    Passive Exposure: Not on Actuary Strain: Low Risk (11/10/2023)   Received from [facility]   Overall Financial Resource Strain (CARDIA)    How hard is it for you to pay for the very basics like food, housing, medical care, and heating?: Not hard at all  Food Insecurity: No Food Insecurity (11/10/2023)   Received from [facility]   Epic    Within the past 12 months, you worried that your food would run out before you got the money to buy more.: Never true    Within the past 12 months, the food you bought just didn't last and you didn't have money to get more.: Never true  Transportation Needs: No Transportation Needs (11/10/2023)   Received from [facility]   PRAPARE - Transportation    Lack of Transportation (Medical): No    Lack of Transportation (Non-Medical): No  Physical Activity: Inactive (11/11/2023)   Received from [facility]   Exercise Vital Sign    On average, how many days per week do you engage in moderate to  strenuous exercise (like a brisk walk)?: 0 days    On average, how many minutes do you engage in exercise at this level?: 0 min  Stress: No Stress Concern Present (11/11/2023)   Received from [facility] - Occupational Stress Questionnaire    Do you feel stress - tense, restless, nervous, or anxious, or unable to sleep at night because your mind  is troubled all the time - these days?: Not at all  Social Connections: Socially Integrated (11/11/2023)   Received from [facility]   Social Connection and Isolation Panel    In a typical week, how many times do you talk on the phone with family, friends, or neighbors?: More than three times a week    How often do you get together with friends or relatives?: More than three times a week    How often do you attend church or religious services?: More than 4 times per year    Do you belong to any clubs or organizations such as church groups, unions, fraternal or athletic groups, or school groups?: Yes    How often do you attend meetings of the clubs or organizations you belong to?: More than 4 times per year    Are you married, widowed, divorced, separated, never married, or living with a partner?: Married  Intimate Partner Violence: Not At Risk (11/11/2023)   Received from [facility]   Epic    Within the last year, have you been afraid of your partner or ex-partner?: No    Within the last year, have you been humiliated or emotionally abused in other ways by your partner or ex-partner?: No    Within the last year, have you been kicked, hit, slapped, or otherwise physically hurt by your partner or ex-partner?: No    Within the last year, have you been raped or forced to have any kind of sexual activity by your partner or ex-partner?: No  Depression (PHQ2-9): Not on file  Alcohol  Screen: Not on file  Housing: Low Risk (05/14/2023)   Received from [facility]    In the last 12 months, was  there a time when you were not able to pay the mortgage or rent on time?: No    In the past 12 months, how many times have you moved where you were living?: 0    At any time in the past 12 months, were you homeless or living in a shelter (including now)?: No  Utilities: Low Risk (11/10/2023)   Received from [facility]   Utilities    Within the past 12 months, have you been unable to get utilities(heat, electricity) when it was really needed?: No  Health Literacy: Low Risk (11/11/2023)   Received from [facility]    How often do you need to have someone help you when you read instructions, pamphlets, or other written material from your doctor or pharmacy?: Never    Past Surgical History:  Procedure Laterality Date   ABDOMINAL AORTAGRAM N/A 04/07/2013   Procedure: ABDOMINAL EZELLA;  Surgeon: Deatrice DELENA Cage, MD;  Location: [facility] CATH LAB;  Service: Cardiovascular;  Laterality: N/A;   ABDOMINAL AORTAGRAM N/A 01/26/2014   Procedure: ABDOMINAL EZELLA;  Surgeon: Deatrice DELENA Cage, MD;  Location: [facility] CATH LAB;  Service: Cardiovascular;  Laterality: N/A;   ABDOMINAL AORTAGRAM N/A 06/13/2014   Procedure: ABDOMINAL EZELLA;  Surgeon: Lonni GORMAN Blade, MD;  Location: [facility] CATH LAB;  Service: Cardiovascular;  Laterality: N/A;   AMPUTATION Left 01/28/2014   Procedure: AMPUTATION DIGIT- LEFT 5TH TOE;  Surgeon: Lonni GORMAN Blade, MD;  Location: [facility] OR;  Service: Vascular;  Laterality: Left;   AMPUTATION Left 02/02/2014   Procedure: Left 4th Ray Amputation vs Transmetatarsal Amputation;  Surgeon: Jerona Harden GAILS, MD;  Location: [facility] OR;  Service: Orthopedics;  Laterality: Left;   AMPUTATION Left  03/24/2014   Procedure: AMPUTATION BELOW KNEE;  Surgeon: Cordella Glendia Hutchinson, MD;  Location: [facility] OR;  Service: Orthopedics;  Laterality: Left;   APPENDECTOMY     BLADDER REPAIR     2024 biopsy done and tumor removed [facility]   CARDIAC CATHETERIZATION     CORONARY ANGIOPLASTY  WITH STENT PLACEMENT     FEMORAL-TIBIAL BYPASS GRAFT Left 01/28/2014   Procedure:  FEMORAL-ANTERIOR TIBIAL ARTERY Bypass Graft utilizing composite gortex graft and vein graft;  Surgeon: Lonni GORMAN Blade, MD;  Location: [facility] OR;  Service: Vascular;  Laterality: Left;   LOWER EXTREMITY ANGIOGRAM Left 01/26/2014   Procedure: LOWER EXTREMITY ANGIOGRAM;  Surgeon: Deatrice DELENA Cage, MD;  Location: [facility] CATH LAB;  Service: Cardiovascular;  Laterality: Left;   RIGHT/LEFT HEART CATH AND CORONARY ANGIOGRAPHY N/A 12/02/2017   Procedure: RIGHT/LEFT HEART CATH AND CORONARY ANGIOGRAPHY;  Surgeon: Jordan, [name], MD;  Location: [facility] INVASIVE CV LAB;  Service: Cardiovascular;  Laterality: N/A;   TEE WITHOUT CARDIOVERSION N/A 03/28/2014   Procedure: TRANSESOPHAGEAL ECHOCARDIOGRAM (TEE);  Surgeon: Wilbert JONELLE Bihari, MD;  Location: [facility] ENDOSCOPY;  Service: Cardiovascular;  Laterality: N/A;    Family History  Problem Relation Age of Onset   Diabetes Mother    Cancer Mother        Pancreatic   Heart disease Mother        After age 55   Hypertension Mother    Hyperlipidemia Mother    Diabetes Sister    Cancer Sister    Cancer Sister        Bone   Cancer Father     Allergies[1]     Latest Ref Rng & Units 04/24/2020    5:29 AM 04/19/2020    6:47 AM 04/10/2020    6:21 AM  CBC  WBC 4.0 - 10.5 K/uL 8.4  11.6  10.2   Hemoglobin 13.0 - 17.0 g/dL 88.4  87.3  86.9   Hematocrit 39.0 - 52.0 % 34.2  40.2  38.2   Platelets 150 - 400 K/uL 384  446  240       CMP     Component Value Date/Time   NA 133 (L) 04/24/2020 0529   NA 138 10/12/2018 0905   K 3.5 04/24/2020 0529   CL 100 04/24/2020 0529   CO2 24 04/24/2020 0529   GLUCOSE 121 (H) 04/24/2020 0529   BUN 10 04/24/2020 0529   BUN 12 10/12/2018 0905   CREATININE 1.06 04/24/2020 0529   CREATININE 1.27 12/19/2010 1702   CREATININE 1.27 12/19/2010 1702   CALCIUM  8.7 (L) 04/24/2020 0529   PROT 7.3 04/24/2020 0529   PROT 7.1 10/12/2018 0905   ALBUMIN 2.3  (L) 04/24/2020 0529   ALBUMIN 4.1 10/12/2018 0905   AST 46 (H) 04/24/2020 0529   ALT 54 (H) 04/24/2020 0529   ALKPHOS 125 04/24/2020 0529   BILITOT 0.7 04/24/2020 0529   BILITOT 0.3 10/12/2018 0905   GFR 69.12 04/02/2013 0950   GFRNONAA >60 04/24/2020 0529     No results found.     Assessment & Plan:   1. Diabetic ulcer of other part of right foot associated with diabetes mellitus due to underlying condition, with fat layer exposed (HCC) (Primary) Chronic lower limb ulcer with peripheral arterial disease Chronic non-healing ulcer with fibrinous exudate and no granulation tissue, linked to peripheral arterial disease and prior amputation. Impaired perfusion likely hinders healing, with risks of infection, osteomyelitis, and further limb loss. Cancer treatment and comorbidities may delay  healing. Delayed intervention risks non-salvageable wound. - Referred to wound care specialist for evaluation and possible mechanical debridement. - Discussed angiogram to assess and potentially improve blood flow, with 85% success rate but no guarantee of healing. Risks include bleeding, anesthesia complications, and possible need for further surgery. Planned angiogram if wound care does not improve or as recommended by specialist. - Planned repeat vascular studies in six weeks to reassess perfusion and wound status. - Advised to contact clinic if angiogram is recommended or decision made to proceed before next follow-up. - Continued home health nursing visits for wound care. - Provided education on wound bed moisture and healing process. - Ambulatory referral to Wound Clinic  2. Essential hypertension Continue antihypertensive medications as already ordered, these medications have been reviewed and there are no changes at this time.  3. Pure hypercholesterolemia Continue statin as ordered and reviewed, no changes at this time    4. History of below-knee amputation of left lower extremity  (HCC) Below knee amputation increases risk for further limb loss if ulcer does not heal. Preservation of remaining limb function is a priority. Chronicity and lack of improvement raise concern for infection or need for additional amputation. - Coordinated care with wound care specialist and vascular studies to optimize limb preservation. - Discussed risks of delayed intervention, including infection and need for more extensive amputation if healing does not improve.      Medications Ordered Prior to Encounter[2]  There are no Patient Instructions on file for this visit. No follow-ups on file.   Jordan FORBES Daring, NP      [1]  Allergies Allergen Reactions   Cilostazol  Other (See Comments)    Feels like feet on fire / Pain in feet  [2]  Current Outpatient Medications on File Prior to Visit  Medication Sig Dispense Refill   carbamide peroxide (DEBROX) 6.5 % OTIC solution 5 drops 2 (two) times daily.     empagliflozin (JARDIANCE) 25 MG TABS tablet Take 25 mg by mouth daily.     escitalopram  (LEXAPRO ) 10 MG tablet Take 1 tablet (10 mg total) by mouth daily. 30 tablet 0   ezetimibe  (ZETIA ) 10 MG tablet Take 1 tablet (10 mg total) by mouth daily. 90 tablet 3   famotidine (PEPCID) 10 MG tablet Take 10 mg by mouth 2 (two) times daily.     ferrous sulfate 325 (65 FE) MG EC tablet Take 325 mg by mouth daily with breakfast.     insulin  aspart (NOVOLOG ) 100 UNIT/ML injection Inject 100 Units into the skin 3 (three) times daily before meals.     insulin  glargine (LANTUS ) 100 UNIT/ML Solostar Pen Inject 30 Units into the skin 2 (two) times daily. 15 mL 11   lidocaine  (LIDODERM ) 5 % Place 1 patch onto the skin daily. Remove & Discard patch within 12 hours or as directed by MD     metoprolol  succinate (TOPROL -XL) 25 MG 24 hr tablet Take 1 tablet (25 mg total) by mouth daily. 30 tablet 0   nitroGLYCERIN  (NITROSTAT ) 0.4 MG SL tablet Place 0.4 mg under the tongue every 5 (five) minutes as needed for  chest pain.     nystatin cream (MYCOSTATIN) Apply 1 Application topically 2 (two) times daily.     pantoprazole  (PROTONIX ) 40 MG tablet Take 40 mg by mouth daily.     rosuvastatin  (CRESTOR ) 10 MG tablet Take 10 mg by mouth daily.     rosuvastatin  (CRESTOR ) 40 MG tablet Take 1 tablet (40 mg total) by  mouth at bedtime. 90 tablet 3   SANTYL 250 UNIT/GM ointment Apply topically daily.     sulfamethoxazole -trimethoprim  (BACTRIM  DS) 800-160 MG tablet Take 1 tablet by mouth 2 (two) times daily. 20 tablet 0   tamsulosin  (FLOMAX ) 0.4 MG CAPS capsule Take 1 capsule (0.4 mg total) by mouth daily after supper. 30 capsule 0   Vitamin D, Ergocalciferol, (DRISDOL) 1.25 MG (50000 UNIT) CAPS capsule Take 50,000 Units by mouth every 30 (thirty) days. On the 1st Monday of each month     acetaminophen  (TYLENOL ) 325 MG tablet Take 2 tablets (650 mg total) by mouth every 4 (four) hours as needed for mild pain (or temp > 37.5 C (99.5 F)).     amLODipine  (NORVASC ) 5 MG tablet Take 1 tablet (5 mg total) by mouth daily. 30 tablet 0   amoxicillin -clavulanate (AUGMENTIN ) 875-125 MG tablet Take 1 tablet by mouth every 12 (twelve) hours. 14 tablet 0   bethanechol  (URECHOLINE ) 25 MG tablet Take 1 tablet (25 mg total) by mouth 3 (three) times daily. 21 tablet 0   clopidogrel  (PLAVIX ) 75 MG tablet Take 1 tablet (75 mg total) by mouth daily. 30 tablet 1   cyanocobalamin  (,VITAMIN B-12,) 1000 MCG/ML injection Inject 1,000 mcg into the muscle every 30 (thirty) days.     cyanocobalamin  1000 MCG tablet Take 1 tablet (1,000 mcg total) by mouth daily. 30 tablet 0   darolutamide (NUBEQA) 300 MG tablet Take 600 mg by mouth 2 (two) times daily with a meal.     furosemide (LASIX) 20 MG tablet Take 40 mg by mouth daily as needed for fluid or edema.     hydrocortisone 2.5%-nystatin-zinc oxide 20% 1:1:1 ointment mixture Apply 1 Application topically. (Patient not taking: Reported on 02/19/2024)     isosorbide  mononitrate (IMDUR ) 30 MG 24 hr  tablet Take 1 tablet (30 mg total) by mouth at bedtime. 30 tablet 0   polyethylene glycol (MIRALAX  / GLYCOLAX ) 17 g packet Take 17 g by mouth 2 (two) times daily. 14 each 0   No current facility-administered medications on file prior to visit.   "

## 2024-03-29 ENCOUNTER — Telehealth (INDEPENDENT_AMBULATORY_CARE_PROVIDER_SITE_OTHER): Payer: Self-pay | Admitting: Nurse Practitioner

## 2024-03-29 NOTE — Telephone Encounter (Signed)
 Patient wife stopped by [facility] stating that they have decided to go ahead with scheduling the Angio procedure FB recommended at patient's visit on 03/19/24. Please advise

## 2024-03-29 NOTE — Telephone Encounter (Signed)
 RLE Angio [phone]

## 2024-03-29 NOTE — Telephone Encounter (Signed)
 Please advise what angio and Cpt codes please.

## 2024-03-30 ENCOUNTER — Telehealth (INDEPENDENT_AMBULATORY_CARE_PROVIDER_SITE_OTHER): Payer: Self-pay

## 2024-03-30 NOTE — Telephone Encounter (Signed)
 Spoke with the patient and his spouse, patient is scheduled on 04/05/24 with a 11:00 am arrival time for a RLE angio with Dr. Marea at the [facility]. Pre-procedure instructions were discussed and will be sent to Mychart and mailed.

## 2024-04-05 ENCOUNTER — Encounter: Admission: RE | Disposition: A | Payer: Self-pay | Attending: Vascular Surgery

## 2024-04-05 ENCOUNTER — Encounter: Payer: Self-pay | Admitting: Vascular Surgery

## 2024-04-05 ENCOUNTER — Other Ambulatory Visit: Payer: Self-pay

## 2024-04-05 ENCOUNTER — Ambulatory Visit: Admit: 2024-04-05 | Admitting: Vascular Surgery

## 2024-04-05 DIAGNOSIS — Z79899 Other long term (current) drug therapy: Secondary | ICD-10-CM | POA: Insufficient documentation

## 2024-04-05 DIAGNOSIS — Z7984 Long term (current) use of oral hypoglycemic drugs: Secondary | ICD-10-CM | POA: Diagnosis not present

## 2024-04-05 DIAGNOSIS — N189 Chronic kidney disease, unspecified: Secondary | ICD-10-CM | POA: Insufficient documentation

## 2024-04-05 DIAGNOSIS — Z87891 Personal history of nicotine dependence: Secondary | ICD-10-CM | POA: Insufficient documentation

## 2024-04-05 DIAGNOSIS — I70202 Unspecified atherosclerosis of native arteries of extremities, left leg: Secondary | ICD-10-CM | POA: Diagnosis not present

## 2024-04-05 DIAGNOSIS — E1151 Type 2 diabetes mellitus with diabetic peripheral angiopathy without gangrene: Secondary | ICD-10-CM | POA: Insufficient documentation

## 2024-04-05 DIAGNOSIS — I130 Hypertensive heart and chronic kidney disease with heart failure and stage 1 through stage 4 chronic kidney disease, or unspecified chronic kidney disease: Secondary | ICD-10-CM | POA: Insufficient documentation

## 2024-04-05 DIAGNOSIS — L97519 Non-pressure chronic ulcer of other part of right foot with unspecified severity: Secondary | ICD-10-CM

## 2024-04-05 DIAGNOSIS — I70299 Other atherosclerosis of native arteries of extremities, unspecified extremity: Secondary | ICD-10-CM

## 2024-04-05 DIAGNOSIS — E11621 Type 2 diabetes mellitus with foot ulcer: Secondary | ICD-10-CM | POA: Insufficient documentation

## 2024-04-05 DIAGNOSIS — E1122 Type 2 diabetes mellitus with diabetic chronic kidney disease: Secondary | ICD-10-CM | POA: Diagnosis not present

## 2024-04-05 DIAGNOSIS — Z794 Long term (current) use of insulin: Secondary | ICD-10-CM | POA: Insufficient documentation

## 2024-04-05 DIAGNOSIS — E1142 Type 2 diabetes mellitus with diabetic polyneuropathy: Secondary | ICD-10-CM | POA: Insufficient documentation

## 2024-04-05 DIAGNOSIS — I70235 Atherosclerosis of native arteries of right leg with ulceration of other part of foot: Secondary | ICD-10-CM | POA: Diagnosis not present

## 2024-04-05 DIAGNOSIS — E7800 Pure hypercholesterolemia, unspecified: Secondary | ICD-10-CM | POA: Diagnosis not present

## 2024-04-05 DIAGNOSIS — L97909 Non-pressure chronic ulcer of unspecified part of unspecified lower leg with unspecified severity: Secondary | ICD-10-CM | POA: Diagnosis present

## 2024-04-05 DIAGNOSIS — Z89512 Acquired absence of left leg below knee: Secondary | ICD-10-CM | POA: Insufficient documentation

## 2024-04-05 HISTORY — PX: LOWER EXTREMITY ANGIOGRAPHY: CATH118251

## 2024-04-05 LAB — GLUCOSE, CAPILLARY
Glucose-Capillary: 173 mg/dL — ABNORMAL HIGH (ref 70–99)
Glucose-Capillary: 142 mg/dL — ABNORMAL HIGH (ref 70–99)

## 2024-04-05 LAB — BUN: BUN: 26 mg/dL — ABNORMAL HIGH (ref 8–23)

## 2024-04-05 LAB — CREATININE, SERUM
Creatinine, Ser: 1.51 mg/dL — ABNORMAL HIGH (ref 0.61–1.24)
GFR, Estimated: 46 mL/min — ABNORMAL LOW

## 2024-04-05 SURGERY — LOWER EXTREMITY ANGIOGRAPHY
Anesthesia: Moderate Sedation | Site: Leg Lower | Laterality: Right

## 2024-04-05 MED ORDER — CEFAZOLIN SODIUM-DEXTROSE 2-4 GM/100ML-% IV SOLN
INTRAVENOUS | Status: AC
  Filled 2024-04-05: qty 100

## 2024-04-05 MED ORDER — MIDAZOLAM HCL 5 MG/5ML IJ SOLN
INTRAMUSCULAR | Status: AC
  Filled 2024-04-05: qty 5

## 2024-04-05 MED ORDER — FAMOTIDINE 20 MG PO TABS: 40.0000 mg | ORAL_TABLET | Freq: Once | ORAL | Status: DC | PRN

## 2024-04-05 MED ORDER — HYDRALAZINE HCL 20 MG/ML IJ SOLN: 5.0000 mg | INTRAMUSCULAR | Status: DC | PRN

## 2024-04-05 MED ORDER — HEPARIN SODIUM (PORCINE) 1000 UNIT/ML IJ SOLN
INTRAMUSCULAR | Status: DC | PRN
  Administered 2024-04-05: 5000 [IU] via INTRAVENOUS

## 2024-04-05 MED ORDER — MIDAZOLAM HCL (PF) 2 MG/2ML IJ SOLN
INTRAMUSCULAR | Status: DC | PRN
  Administered 2024-04-05: 1 mg via INTRAVENOUS

## 2024-04-05 MED ORDER — FENTANYL CITRATE (PF) 100 MCG/2ML IJ SOLN
INTRAMUSCULAR | Status: AC
  Filled 2024-04-05: qty 2

## 2024-04-05 MED ORDER — ASPIRIN 81 MG PO TBEC: 81.0000 mg | DELAYED_RELEASE_TABLET | Freq: Every day | ORAL | Status: DC

## 2024-04-05 MED ORDER — CEFAZOLIN SODIUM-DEXTROSE 2-4 GM/100ML-% IV SOLN: 2.0000 g | INTRAVENOUS | Status: DC

## 2024-04-05 MED ORDER — LIDOCAINE-EPINEPHRINE (PF) 1 %-1:200000 IJ SOLN
INTRAMUSCULAR | Status: DC | PRN
  Administered 2024-04-05: 10 mL

## 2024-04-05 MED ORDER — LABETALOL HCL 5 MG/ML IV SOLN: 10.0000 mg | INTRAVENOUS | Status: DC | PRN

## 2024-04-05 MED ORDER — HEPARIN (PORCINE) IN NACL 1000-0.9 UT/500ML-% IV SOLN
INTRAVENOUS | Status: DC | PRN
  Administered 2024-04-05: 1000 mL

## 2024-04-05 MED ORDER — SODIUM CHLORIDE 0.9 % IV SOLN: INTRAVENOUS | Status: DC

## 2024-04-05 MED ORDER — ACETAMINOPHEN 325 MG PO TABS: 650.0000 mg | ORAL_TABLET | ORAL | Status: DC | PRN

## 2024-04-05 MED ORDER — IODIXANOL 320 MG/ML IV SOLN
INTRAVENOUS | Status: DC | PRN
  Administered 2024-04-05: 80 mL

## 2024-04-05 MED ORDER — CEFAZOLIN SODIUM-DEXTROSE 1-4 GM/50ML-% IV SOLN
INTRAVENOUS | Status: DC | PRN
  Administered 2024-04-05: 2 g via INTRAVENOUS

## 2024-04-05 MED ORDER — ONDANSETRON HCL 4 MG/2ML IJ SOLN: 4.0000 mg | Freq: Four times a day (QID) | INTRAMUSCULAR | Status: DC | PRN

## 2024-04-05 MED ORDER — HEPARIN SODIUM (PORCINE) 1000 UNIT/ML IJ SOLN
INTRAMUSCULAR | Status: AC
  Filled 2024-04-05: qty 10

## 2024-04-05 MED ORDER — MIDAZOLAM HCL 2 MG/ML PO SYRP: 8.0000 mg | ORAL_SOLUTION | Freq: Once | ORAL | Status: DC | PRN

## 2024-04-05 MED ORDER — METHYLPREDNISOLONE SODIUM SUCC 125 MG IJ SOLR: 125.0000 mg | Freq: Once | INTRAMUSCULAR | Status: DC | PRN

## 2024-04-05 MED ORDER — HYDROMORPHONE HCL 1 MG/ML IJ SOLN: 1.0000 mg | Freq: Once | INTRAMUSCULAR | Status: DC | PRN

## 2024-04-05 MED ORDER — CLOPIDOGREL BISULFATE 75 MG PO TABS: 75.0000 mg | ORAL_TABLET | Freq: Every day | ORAL | 1 refills | Status: AC

## 2024-04-05 MED ORDER — CLOPIDOGREL BISULFATE 75 MG PO TABS: 75.0000 mg | ORAL_TABLET | Freq: Every day | ORAL | 11 refills | Status: AC

## 2024-04-05 MED ORDER — SODIUM CHLORIDE 0.9% FLUSH: 3.0000 mL | INTRAVENOUS | Status: DC | PRN

## 2024-04-05 MED ORDER — DIPHENHYDRAMINE HCL 50 MG/ML IJ SOLN: 50.0000 mg | Freq: Once | INTRAMUSCULAR | Status: DC | PRN

## 2024-04-05 MED ORDER — SODIUM CHLORIDE 0.9 % IV SOLN: 250.0000 mL | INTRAVENOUS | Status: DC | PRN

## 2024-04-05 MED ORDER — CLOPIDOGREL BISULFATE 75 MG PO TABS: 75.0000 mg | ORAL_TABLET | Freq: Every day | ORAL | Status: DC

## 2024-04-05 MED ORDER — SODIUM CHLORIDE 0.9% FLUSH: 3.0000 mL | Freq: Two times a day (BID) | INTRAVENOUS | Status: DC

## 2024-04-05 MED ORDER — FENTANYL CITRATE (PF) 100 MCG/2ML IJ SOLN
INTRAMUSCULAR | Status: DC | PRN
  Administered 2024-04-05: 50 ug via INTRAVENOUS

## 2024-04-05 SURGICAL SUPPLY — 26 items
BALLOON JADE .014 4.0 X 40 0 (BALLOONS) IMPLANT
BALLOON LUTONIX 018 4X100X130 (BALLOONS) IMPLANT
BALLOON LUTONIX 018 5X60X130 (BALLOONS) IMPLANT
BALLOON LUTONIX 018 6X150X130 (BALLOONS) IMPLANT
BALLOON LUTONIX DCB 6X100X130 (BALLOONS) IMPLANT
BALLOON ULTRVRSE 3X220X150 (BALLOONS) IMPLANT
CATH ANGIO 5F PIGTAIL 65CM (CATHETERS) IMPLANT
CATH BEACON 5 .038 100 VERT TP (CATHETERS) IMPLANT
CATH CXI SUPP ANG 4FR 135 (CATHETERS) IMPLANT
COVER PROBE ULTRASOUND 5X96 (MISCELLANEOUS) IMPLANT
DEVICE PRESTO INFLATION (MISCELLANEOUS) IMPLANT
DEVICE STARCLOSE SE CLOSURE (Vascular Products) IMPLANT
GLIDEWIRE ADV .035X260CM (WIRE) IMPLANT
GUIDEWIRE PFTE-COATED .018X300 (WIRE) IMPLANT
INTRODUCER 7FR 23CM (INTRODUCER) IMPLANT
PACK ANGIOGRAPHY (CUSTOM PROCEDURE TRAY) ×1 IMPLANT
SHEATH ANL2 6FRX45 HC (SHEATH) IMPLANT
SHEATH BRITE TIP 5FRX11 (SHEATH) IMPLANT
STENT LIFESTENT 5F 7X100X135 (Permanent Stent) IMPLANT
STENT LIFESTREAM 9X58X80 (Permanent Stent) IMPLANT
STENT OTW ESPRIT BTK 3.75X38 (Permanent Stent) IMPLANT
STENT VIABAHN 6X50X120 (Permanent Stent) IMPLANT
SYR MEDRAD MARK 7 150ML (SYRINGE) IMPLANT
TUBING CONTRAST HIGH PRESS 72 (TUBING) IMPLANT
WIRE COMMAND ST ANG 014 300 (WIRE) IMPLANT
WIRE J 3MM .035X145CM (WIRE) IMPLANT

## 2024-04-05 NOTE — Interval H&P Note (Signed)
 History and Physical Interval Note:  04/05/2024 11:04 AM  Jordan [name].  has presented today for surgery, with the diagnosis of RLE Angio    ASO w ulceration.  The various methods of treatment have been discussed with the patient and family. After consideration of risks, benefits and other options for treatment, the patient has consented to  Procedures: Lower Extremity Angiography (Right) as a surgical intervention.  The patient's history has been reviewed, patient examined, no change in status, stable for surgery.  I have reviewed the patient's chart and labs.  Questions were answered to the patient's satisfaction.     Tia Gelb

## 2024-04-05 NOTE — Op Note (Signed)
 Jordan [name]  Percutaneous Study/Intervention Procedural Note   Date of Surgery: 04/05/2024  Surgeon(s):Feras Gardella    Assistants:none  Pre-operative Diagnosis: PAD with ulceration RLE  Post-operative diagnosis:  Same  Procedure(s) Performed:             1.  Ultrasound guidance for vascular access left femoral artery             2.  Catheter placement into right femoral artery from left femoral approach             3.  Aortogram and selective right lower extremity angiogram             4.  Percutaneous transluminal angioplasty of right peroneal artery and tibioperoneal trunk with 3 mm diameter angioplasty balloon             5.  Percutaneous transluminal angioplasty of right popliteal artery with 4 mm diameter Lutonix drug-coated angioplasty balloon  6.  Percutaneous transluminal angioplasty of right SFA with 6 mm diameter Lutonix drug-coated angioplasty balloon  7.  Stent placement to the right peroneal artery and tibioperoneal trunk with 3.75 mm diameter by 38 mm length Esprit scaffolding system  8.  Stent placement to the right popliteal artery with 6 mm diameter by 5 cm length Viabahn stent  9.  Additional stent to the right SFA with 7 mm diameter by 12 cm length life stent  10.  Stent placement to the left common iliac artery with 9 mm diameter by 58 mm length Lifestream stent             11.  StarClose closure device left femoral artery  EBL: 5 cc  Contrast: 80 cc  Fluoro Time: 11.2 minutes  Moderate Conscious Sedation Time: approximately 45 minutes using 1 mg of Versed  and 50 mcg of Fentanyl               Indications:  Patient is a 83 y.o.male with nonhealing ulcerations of the right foot. The patient has noninvasive study showing markedly reduced perfusion on the right. The patient is brought in for angiography for further evaluation and potential treatment.  Due to the limb threatening nature of the situation, angiogram was performed for attempted limb  salvage. The patient is aware that if the procedure fails, amputation would be expected.  The patient also understands that even with successful revascularization, amputation may still be required due to the severity of the situation.  Risks and benefits are discussed and informed consent is obtained.   Procedure:  The patient was identified and appropriate procedural time out was performed.  The patient was then placed supine on the table and prepped and draped in the usual sterile fashion. Moderate conscious sedation was administered during a face to face encounter with the patient throughout the procedure with my supervision of the RN administering medicines and monitoring the patient's vital signs, pulse oximetry, telemetry and mental status throughout from the start of the procedure until the patient was taken to the recovery room. Ultrasound was used to evaluate the left common femoral artery.  It was patent .  A digital ultrasound image was acquired.  A Seldinger needle was used to access the left common femoral artery under direct ultrasound guidance and a permanent image was performed.  A 0.035 J wire was advanced without resistance and a 5Fr sheath was placed.  Pigtail catheter was placed into the aorta and an AP aortogram was performed.  This was followed by pelvic obliques to  opacify the iliac arteries better.  This demonstrated normal renal arteries.  The aorta was fairly normal.  The right iliac system did not have any significant stenosis.  The left common iliac artery had about a 75 to 80% stenosis.  The left internal iliac artery was occluded.  The left external iliac artery normalized after the common iliac artery stenosis. I then crossed the aortic bifurcation and advanced to the right femoral head. Selective right lower extremity angiogram was then performed. This demonstrated right common femoral artery with stenosis in the 60 to 70% range.  The profunda femoris artery was small and somewhat  diseased.  The proximal SFA was patent but in the mid and mid to distal segment was a fairly high-grade calcified stenosis in the 80 to 90% range.  There was then occlusion of the popliteal artery in the midsegment and occlusion of all 3 tibial vessels initially.  There was reconstitution of a proximal to mid peroneal artery as the only runoff distally with occlusion of the TP trunk and proximal peroneal artery. It was felt that it was in the patient's best interest to proceed with intervention after these images to avoid a second procedure and a larger amount of contrast and fluoroscopy based off of the findings from the initial angiogram, although I would defer treatment of the common femoral artery and just try to improve the perfusion through the multiple other lesions present.  If his wounds did not heal with this, he would later need a common femoral endarterectomy. The patient was systemically heparinized and a 6 French Ansell sheath was then placed over the [employer] wire. I then used a Kumpe catheter and the advantage wire to navigate through the SFA stenosis and then down into the popliteal occlusion.  Tediously, using a CXI catheter and initially a 0.018 advantage wire and later 0.014 command wire I was able to cross the tibioperoneal trunk and peroneal occlusion and get down into the peroneal artery.  I then proceeded with treatment.  A 3 mm diameter by 22 cm length angioplasty balloon was used to treat the peroneal artery and tibioperoneal trunk.  A 4 mm diameter by 10 cm length Lutonix drug-coated angioplasty balloon was used to treat the popliteal artery and inflated to 10 atm for 1 minute.  The SFA was addressed with a 6 mm diameter by 12 cm length Lutonix drug-coated angioplasty balloon.  Imaging following this showed significant residual disease of greater than 50% in all 3 locations after angioplasty.  I addressed the peroneal artery and tibioperoneal trunk with a 3.75 mm diameter by 38 mm  length Esprit stent that was then postdilated to 4 mm with a jade balloon.  The popliteal lesion was addressed with a 6 mm diameter by 5 cm length Viabahn stent postdilated with a 5 mm diameter Lutonix drug-coated balloon.  The right SFA was then addressed with a 7 mm diameter by 12 cm length life stent postdilated with a 6 mm diameter Lutonix drug-coated balloon.  Completion imaging showed less than 10% residual stenosis in the SFA, 10 to 15% residual stenosis in the popliteal artery, and 10 to 15% residual stenosis in the peroneal artery and tibioperoneal trunk after stent placement.  I then replaced a 0.035 advantage wire and upsized to a 7 French sheath to address the left common iliac artery stenosis.  A 9 mm diameter by 58 mm length Lifestream stent was used to encompass the lesion starting just a couple of millimeters below the origin  at the aortic bifurcation and terminating in what was likely the proximal external iliac artery although the internal iliac artery was already occluded.  This was inflated to 12 atm with excellent angiograph completion result and less than 10% residual stenosis. I elected to terminate the procedure. The sheath was removed and StarClose closure device was deployed in the left femoral artery with excellent hemostatic result. The patient was taken to the recovery room in stable condition having tolerated the procedure well.  Findings:               Aortogram:  This demonstrated normal renal arteries.  The aorta was fairly normal.  The right iliac system did not have any significant stenosis.  The left common iliac artery had about a 75 to 80% stenosis.  The left internal iliac artery was occluded.  The left external iliac artery normalized after the common iliac artery stenosis.             Right lower Extremity:  This demonstrated right common femoral artery with stenosis in the 60 to 70% range.  The profunda femoris artery was small and somewhat diseased.  The proximal SFA  was patent but in the mid and mid to distal segment was a fairly high-grade calcified stenosis in the 80 to 90% range.  There was then occlusion of the popliteal artery in the midsegment and occlusion of all 3 tibial vessels initially.  There was reconstitution of a proximal to mid peroneal artery as the only runoff distally with occlusion of the TP trunk and proximal peroneal artery.   Disposition: Patient was taken to the recovery room in stable condition having tolerated the procedure well.  Complications: None  Jordan [name] 04/05/2024 3:01 PM   This note was created with Dragon Medical transcription system. Any errors in dictation are purely unintentional.

## 2024-04-05 NOTE — Progress Notes (Signed)
 Dr. Marea in at bedside, speaking with pt. And family re: vascular  results. Pt. And family verbalized understanding of conversation with MD.

## 2024-04-13 ENCOUNTER — Encounter: Attending: Physician Assistant | Admitting: Physician Assistant

## 2024-04-13 DIAGNOSIS — E11622 Type 2 diabetes mellitus with other skin ulcer: Secondary | ICD-10-CM | POA: Insufficient documentation

## 2024-04-13 DIAGNOSIS — I5042 Chronic combined systolic (congestive) and diastolic (congestive) heart failure: Secondary | ICD-10-CM | POA: Insufficient documentation

## 2024-04-13 DIAGNOSIS — N183 Chronic kidney disease, stage 3 unspecified: Secondary | ICD-10-CM | POA: Insufficient documentation

## 2024-04-13 DIAGNOSIS — E1122 Type 2 diabetes mellitus with diabetic chronic kidney disease: Secondary | ICD-10-CM | POA: Insufficient documentation

## 2024-04-13 DIAGNOSIS — Z87891 Personal history of nicotine dependence: Secondary | ICD-10-CM | POA: Insufficient documentation

## 2024-04-13 DIAGNOSIS — I639 Cerebral infarction, unspecified: Secondary | ICD-10-CM | POA: Insufficient documentation

## 2024-04-13 DIAGNOSIS — L97314 Non-pressure chronic ulcer of right ankle with necrosis of bone: Secondary | ICD-10-CM | POA: Insufficient documentation

## 2024-04-13 DIAGNOSIS — C61 Malignant neoplasm of prostate: Secondary | ICD-10-CM | POA: Insufficient documentation

## 2024-04-13 DIAGNOSIS — I130 Hypertensive heart and chronic kidney disease with heart failure and stage 1 through stage 4 chronic kidney disease, or unspecified chronic kidney disease: Secondary | ICD-10-CM | POA: Insufficient documentation

## 2024-04-14 ENCOUNTER — Other Ambulatory Visit: Payer: Self-pay | Admitting: Physician Assistant

## 2024-04-14 DIAGNOSIS — E11622 Type 2 diabetes mellitus with other skin ulcer: Secondary | ICD-10-CM

## 2024-04-14 DIAGNOSIS — L97314 Non-pressure chronic ulcer of right ankle with necrosis of bone: Secondary | ICD-10-CM

## 2024-04-16 ENCOUNTER — Ambulatory Visit: Admission: RE | Admit: 2024-04-16 | Source: Ambulatory Visit

## 2024-04-20 ENCOUNTER — Encounter: Admitting: Physician Assistant

## 2024-04-21 ENCOUNTER — Telehealth (INDEPENDENT_AMBULATORY_CARE_PROVIDER_SITE_OTHER): Payer: Self-pay

## 2024-04-21 NOTE — Telephone Encounter (Signed)
 Per wife please cancel upcoming appointments as patient has had a recent amputation and is hospitalized. Wife reports she will call to follow up if needed once he has been discharged.

## 2024-04-29 ENCOUNTER — Encounter (INDEPENDENT_AMBULATORY_CARE_PROVIDER_SITE_OTHER)

## 2024-04-29 ENCOUNTER — Ambulatory Visit (INDEPENDENT_AMBULATORY_CARE_PROVIDER_SITE_OTHER): Admitting: Nurse Practitioner

## 2024-04-30 ENCOUNTER — Other Ambulatory Visit (HOSPITAL_COMMUNITY): Payer: Self-pay

## 2024-04-30 DIAGNOSIS — C61 Malignant neoplasm of prostate: Secondary | ICD-10-CM

## 2024-05-17 ENCOUNTER — Encounter (HOSPITAL_COMMUNITY)

## 2024-05-24 ENCOUNTER — Encounter: Admitting: Physician Assistant
